# Patient Record
Sex: Female | Born: 1935 | Race: White | Hispanic: No | Smoking: Former smoker
Health system: Southern US, Community
[De-identification: ages and names within clinical notes are randomized; demographics above are authoritative.]

## PROBLEM LIST (undated history)

## (undated) DIAGNOSIS — B019 Varicella without complication: Secondary | ICD-10-CM

## (undated) DIAGNOSIS — K5792 Diverticulitis of intestine, part unspecified, without perforation or abscess without bleeding: Secondary | ICD-10-CM

## (undated) DIAGNOSIS — C159 Malignant neoplasm of esophagus, unspecified: Secondary | ICD-10-CM

## (undated) DIAGNOSIS — K8050 Calculus of bile duct without cholangitis or cholecystitis without obstruction: Secondary | ICD-10-CM

## (undated) DIAGNOSIS — M1990 Unspecified osteoarthritis, unspecified site: Secondary | ICD-10-CM

## (undated) DIAGNOSIS — E119 Type 2 diabetes mellitus without complications: Secondary | ICD-10-CM

## (undated) DIAGNOSIS — K219 Gastro-esophageal reflux disease without esophagitis: Secondary | ICD-10-CM

## (undated) DIAGNOSIS — R32 Unspecified urinary incontinence: Secondary | ICD-10-CM

## (undated) DIAGNOSIS — C55 Malignant neoplasm of uterus, part unspecified: Secondary | ICD-10-CM

## (undated) DIAGNOSIS — G4733 Obstructive sleep apnea (adult) (pediatric): Secondary | ICD-10-CM

## (undated) DIAGNOSIS — I10 Essential (primary) hypertension: Secondary | ICD-10-CM

## (undated) DIAGNOSIS — E785 Hyperlipidemia, unspecified: Secondary | ICD-10-CM

## (undated) HISTORY — PX: SPINAL CORD STIMULATOR IMPLANT: SHX2422

## (undated) HISTORY — DX: Varicella without complication: B01.9

## (undated) HISTORY — PX: TOTAL KNEE ARTHROPLASTY: SHX125

## (undated) HISTORY — DX: Obstructive sleep apnea (adult) (pediatric): G47.33

## (undated) HISTORY — DX: Diverticulitis of intestine, part unspecified, without perforation or abscess without bleeding: K57.92

## (undated) HISTORY — DX: Essential (primary) hypertension: I10

## (undated) HISTORY — DX: Type 2 diabetes mellitus without complications: E11.9

## (undated) HISTORY — DX: Gastro-esophageal reflux disease without esophagitis: K21.9

## (undated) HISTORY — PX: APPENDECTOMY: SHX54

## (undated) HISTORY — DX: Malignant neoplasm of uterus, part unspecified: C55

## (undated) HISTORY — DX: Hyperlipidemia, unspecified: E78.5

## (undated) HISTORY — DX: Unspecified osteoarthritis, unspecified site: M19.90

## (undated) HISTORY — PX: LUMBAR LAMINECTOMY: SHX95

## (undated) HISTORY — PX: CHOLECYSTECTOMY: SHX55

## (undated) HISTORY — PX: TONSILLECTOMY: SUR1361

## (undated) HISTORY — DX: Unspecified urinary incontinence: R32

---

## 1970-12-16 HISTORY — PX: ABDOMINAL HYSTERECTOMY: SHX81

## 2020-10-19 ENCOUNTER — Other Ambulatory Visit: Payer: Self-pay

## 2020-10-19 ENCOUNTER — Encounter: Payer: Self-pay | Admitting: Family Medicine

## 2020-10-19 ENCOUNTER — Ambulatory Visit (INDEPENDENT_AMBULATORY_CARE_PROVIDER_SITE_OTHER): Payer: Medicare Other | Admitting: Family Medicine

## 2020-10-19 ENCOUNTER — Other Ambulatory Visit (HOSPITAL_COMMUNITY)
Admission: RE | Admit: 2020-10-19 | Discharge: 2020-10-19 | Disposition: A | Discharge status: Home / Self Care | Payer: Medicare Other | Source: Ambulatory Visit | Attending: Family Medicine | Admitting: Family Medicine

## 2020-10-19 VITALS — BP 130/60 | HR 59 | Temp 98.5°F | Resp 18 | Ht 64.0 in | Wt 163.4 lb

## 2020-10-19 DIAGNOSIS — K8689 Other specified diseases of pancreas: Secondary | ICD-10-CM | POA: Insufficient documentation

## 2020-10-19 DIAGNOSIS — I10 Essential (primary) hypertension: Secondary | ICD-10-CM | POA: Diagnosis not present

## 2020-10-19 DIAGNOSIS — G8929 Other chronic pain: Secondary | ICD-10-CM

## 2020-10-19 DIAGNOSIS — N898 Other specified noninflammatory disorders of vagina: Secondary | ICD-10-CM | POA: Diagnosis not present

## 2020-10-19 DIAGNOSIS — E119 Type 2 diabetes mellitus without complications: Secondary | ICD-10-CM | POA: Diagnosis not present

## 2020-10-19 DIAGNOSIS — K2100 Gastro-esophageal reflux disease with esophagitis, without bleeding: Secondary | ICD-10-CM

## 2020-10-19 DIAGNOSIS — E1165 Type 2 diabetes mellitus with hyperglycemia: Secondary | ICD-10-CM | POA: Insufficient documentation

## 2020-10-19 DIAGNOSIS — M549 Dorsalgia, unspecified: Secondary | ICD-10-CM

## 2020-10-19 DIAGNOSIS — G4733 Obstructive sleep apnea (adult) (pediatric): Secondary | ICD-10-CM

## 2020-10-19 DIAGNOSIS — E785 Hyperlipidemia, unspecified: Secondary | ICD-10-CM | POA: Insufficient documentation

## 2020-10-19 LAB — POC URINALSYSI DIPSTICK (AUTOMATED)
Bilirubin, UA: NEGATIVE
Blood, UA: NEGATIVE
Glucose, UA: NEGATIVE
Ketones, UA: NEGATIVE
Leukocytes, UA: NEGATIVE
Nitrite, UA: NEGATIVE
Protein, UA: NEGATIVE
Spec Grav, UA: 1.015 (ref 1.010–1.025)
Urobilinogen, UA: 0.2 E.U./dL
pH, UA: 5.5 (ref 5.0–8.0)

## 2020-10-19 MED ORDER — OMEPRAZOLE 20 MG PO CPDR: 20.0000 mg | DELAYED_RELEASE_CAPSULE | Freq: Two times a day (BID) | ORAL | 3 refills | Status: DC

## 2020-10-19 NOTE — Assessment & Plan Note (Signed)
Pt has app with Dr Nelva Bush for pain management

## 2020-10-19 NOTE — Assessment & Plan Note (Signed)
Well controlled, no changes to meds. Encouraged heart healthy diet such as the DASH diet and exercise as tolerated.  con't losartan

## 2020-10-19 NOTE — Assessment & Plan Note (Signed)
F/u gi- They were talking about surgery in [place] but pt does not wish ot have surgery --  Mass is benign

## 2020-10-19 NOTE — Assessment & Plan Note (Signed)
Check labs today Sugars good per pt and daughter

## 2020-10-19 NOTE — Patient Instructions (Signed)
Vaginitis Vaginitis is a condition in which the vaginal tissue swells and becomes red (inflamed). This condition is most often caused by a change in the normal balance of bacteria and yeast that live in the vagina. This change causes an overgrowth of certain bacteria or yeast, which causes the inflammation. There are different types of vaginitis, but the most common types are:  Bacterial vaginosis.  Yeast infection (candidiasis).  Trichomoniasis vaginitis. This is a sexually transmitted disease (STD).  Viral vaginitis.  Atrophic vaginitis.  Allergic vaginitis. What are the causes? The cause of this condition depends on the type of vaginitis. It can be caused by:  Bacteria (bacterial vaginosis).  Yeast, which is a fungus (yeast infection).  A parasite (trichomoniasis vaginitis).  A virus (viral vaginitis).  Low hormone levels (atrophic vaginitis). Low hormone levels can occur during pregnancy, breastfeeding, or after menopause.  Irritants, such as bubble baths, scented tampons, and feminine sprays (allergic vaginitis). Other factors can change the normal balance of the yeast and bacteria that live in the vagina. These include:  Antibiotic medicines.  Poor hygiene.  Diaphragms, vaginal sponges, spermicides, birth control pills, and intrauterine devices (IUD).  Sex.  Infection.  Uncontrolled diabetes.  A weakened defense (immune) system. What increases the risk? This condition is more likely to develop in women who:  Smoke.  Use vaginal douches, scented tampons, or scented sanitary pads.  Wear tight-fitting pants.  Wear thong underwear.  Use oral birth control pills or an IUD.  Have sex without a condom.  Have multiple sex partners.  Have an STD.  Frequently use the spermicide nonoxynol-9.  Eat lots of foods high in sugar.  Have uncontrolled diabetes.  Have low estrogen levels.  Have a weakened immune system from an immune disorder or medical  treatment.  Are pregnant or breastfeeding. What are the signs or symptoms? Symptoms vary depending on the cause of the vaginitis. Common symptoms include:  Abnormal vaginal discharge. ? The discharge is white, gray, or yellow with bacterial vaginosis. ? The discharge is thick, white, and cheesy with a yeast infection. ? The discharge is frothy and yellow or greenish with trichomoniasis.  A bad vaginal smell. The smell is fishy with bacterial vaginosis.  Vaginal itching, pain, or swelling.  Sex that is painful.  Pain or burning when urinating. Sometimes there are no symptoms. How is this diagnosed? This condition is diagnosed based on your symptoms and medical history. A physical exam, including a pelvic exam, will also be done. You may also have other tests, including:  Tests to determine the pH level (acidity or alkalinity) of your vagina.  A whiff test, to assess the odor that results when a sample of your vaginal discharge is mixed with a potassium hydroxide solution.  Tests of vaginal fluid. A sample will be examined under a microscope. How is this treated? Treatment varies depending on the type of vaginitis you have. Your treatment may include:  Antibiotic creams or pills to treat bacterial vaginosis and trichomoniasis.  Antifungal medicines, such as vaginal creams or suppositories, to treat a yeast infection.  Medicine to ease discomfort if you have viral vaginitis. Your sexual partner should also be treated.  Estrogen delivered in a cream, pill, suppository, or vaginal ring to treat atrophic vaginitis. If vaginal dryness occurs, lubricants and moisturizing creams may help. You may need to avoid scented soaps, sprays, or douches.  Stopping use of a product that is causing allergic vaginitis. Then using a vaginal cream to treat the symptoms. Follow   these instructions at home: Lifestyle  Keep your genital area clean and dry. Avoid soap, and only rinse the area with  water.  Do not douche or use tampons until your health care provider says it is okay to do so. Use sanitary pads, if needed.  Do not have sex until your health care provider approves. When you can return to sex, practice safe sex and use condoms.  Wipe from front to back. This avoids the spread of bacteria from the rectum to the vagina. General instructions  Take over-the-counter and prescription medicines only as told by your health care provider.  If you were prescribed an antibiotic medicine, take or use it as told by your health care provider. Do not stop taking or using the antibiotic even if you start to feel better.  Keep all follow-up visits as told by your health care provider. This is important. How is this prevented?  Use mild, non-scented products. Do not use things that can irritate the vagina, such as fabric softeners. Avoid the following products if they are scented: ? Feminine sprays. ? Detergents. ? Tampons. ? Feminine hygiene products. ? Soaps or bubble baths.  Let air reach your genital area. ? Wear cotton underwear to reduce moisture buildup. ? Avoid wearing underwear while you sleep. ? Avoid wearing tight pants and underwear or nylons without a cotton panel. ? Avoid wearing thong underwear.  Take off any wet clothing, such as bathing suits, as soon as possible.  Practice safe sex and use condoms. Contact a health care provider if:  You have abdominal pain.  You have a fever.  You have symptoms that last for more than 2-3 days. Get help right away if:  You have a fever and your symptoms suddenly get worse. Summary  Vaginitis is a condition in which the vaginal tissue becomes inflamed.This condition is most often caused by a change in the normal balance of bacteria and yeast that live in the vagina.  Treatment varies depending on the type of vaginitis you have.  Do not douche, use tampons , or have sex until your health care provider approves. When  you can return to sex, practice safe sex and use condoms. This information is not intended to replace advice given to you by your health care provider. Make sure you discuss any questions you have with your health care provider. Document Revised: 11/14/2017 Document Reviewed: 01/07/2017 Elsevier Patient Education  2020 [employer]  

## 2020-10-19 NOTE — Assessment & Plan Note (Signed)
Inc omeprazole to bid F/u gi

## 2020-10-19 NOTE — Progress Notes (Signed)
Patient ID: Sherry Holloway, female    DOB: 1928-12-14  Age: 86 y.o. MRN: [id number]    Subjective:  Subjective  HPI Erielle Gawronski presents to be established --- her daughter is with her  She c/o vaginal odor that comes and goes  It is not present at this time.   She has a hx of a pancreatic mass that was being worked up in [place] --- it was benign but needed f/u per pt daughter   The pt has no abd pain at this time.    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  She has had mult surgeries   Review of Systems  Constitutional: Negative for appetite change, diaphoresis, fatigue and unexpected weight change.  Eyes: Negative for pain, redness and visual disturbance.  Respiratory: Negative for cough, chest tightness, shortness of breath and wheezing.   Cardiovascular: Negative for chest pain, palpitations and leg swelling.  Endocrine: Negative for cold intolerance, heat intolerance, polydipsia, polyphagia and polyuria.  Genitourinary: Negative for difficulty urinating, dysuria and frequency.  Musculoskeletal: Positive for arthralgias, back pain and gait problem.  Neurological: Negative for dizziness, light-headedness, numbness and headaches.    History Past Medical History:  Diagnosis Date  . Arthritis   . Chicken pox   . Diabetes mellitus without complication ([place])   . Diverticulitis   . Hyperlipidemia   . Hypertension   . OSA (obstructive sleep apnea)   . Urine incontinence     She has a past surgical history that includes Appendectomy; Cholecystectomy; Abdominal hysterectomy (1972); and Spinal cord stimulator implant.   Her family history is not on file.She reports that she has quit smoking. She has never used smokeless tobacco. She reports previous alcohol use. She reports that she does not use drugs.  Current Outpatient Medications on File Prior to Visit  Medication Sig Dispense Refill  .  acetaminophen (TYLENOL) 325 MG tablet Take 650 mg by mouth every 6 (six) hours as needed. 1-2 tablets PRN for pain    . aspirin EC 81 MG tablet Take 81 mg by mouth daily. Swallow whole.    . bumetanide (BUMEX) 1 MG tablet Take 1 mg by mouth daily. Take 1/2 to 1 tablet daily for swelling    . Docusate Sodium (COLACE PO) Take by mouth. PRN for constipation    . famciclovir (FAMVIR) 125 MG tablet Take 125 mg by mouth daily.    Marland Kitchen FLUoxetine (PROZAC) 10 MG capsule Take 10 mg by mouth daily.    Marland Kitchen glimepiride (AMARYL) 2 MG tablet Take 2 mg by mouth daily with breakfast. Take 2 tablets daily    . Ibuprofen (MOTRIN PO) Take by mouth. PRN    . lipase/protease/amylase (CREON) 36000 UNITS CPEP capsule Take 36,000 Units by mouth 3 (three) times daily before meals.    . mirtazapine (REMERON) 30 MG tablet Take 30 mg by mouth at bedtime.    Marland Kitchen morphine (MS CONTIN) 30 MG 12 hr tablet Take 30 mg by mouth every 12 (twelve) hours.    . Multiple Vitamins-Minerals (WOMENS 50+ MULTI VITAMIN/MIN PO) Take by mouth.    Marland Kitchen omeprazole (PRILOSEC) 20 MG capsule Take 20 mg by mouth daily.    . psyllium (METAMUCIL) 58.6 % packet Take 1 packet by mouth daily.    . valsartan (DIOVAN) 40 MG tablet Take 40 mg by mouth daily. Take 1/2 tablet one day, then a  whole tablet the next day     No current facility-administered medications on file prior to visit.     Objective:  Objective  Physical Exam Vitals and nursing note reviewed.  Constitutional:      Appearance: She is well-developed.  HENT:     Head: Normocephalic and atraumatic.  Eyes:     Conjunctiva/sclera: Conjunctivae normal.  Neck:     Thyroid: No thyromegaly.     Vascular: No carotid bruit or JVD.  Cardiovascular:     Rate and Rhythm: Normal rate and regular rhythm.     Heart sounds: Normal heart sounds. No murmur heard.   Pulmonary:     Effort: Pulmonary effort is normal. No respiratory distress.     Breath sounds: Normal breath sounds. No wheezing or rales.    Chest:     Chest wall: No tenderness.  Musculoskeletal:     Cervical back: Normal range of motion and neck supple.     Comments: Walks with walker  Neurological:     Mental Status: She is alert and oriented to person, place, and time.    BP 130/60 (BP Location: Right Arm, Patient Position: Sitting, Cuff Size: Normal)   Pulse (!) 59   Temp 98.5 F (36.9 C) (Oral)   Resp 18   Ht 5\' 4"  (1.626 m)   Wt 163 lb 6.4 oz (74.1 kg)   SpO2 96%   BMI 28.05 kg/m  Wt Readings from Last 3 Encounters:  10/19/20 163 lb 6.4 oz (74.1 kg)     No results found for: WBC, HGB, HCT, PLT, GLUCOSE, CHOL, TRIG, HDL, LDLDIRECT, LDLCALC, ALT, AST, NA, K, CL, CREATININE, BUN, CO2, TSH, PSA, INR, GLUF, HGBA1C, MICROALBUR  Patient was never admitted.   Assessment & Plan:  Plan  I am having Lakyia Alix start on omeprazole. I am also having her maintain her famciclovir, aspirin EC, bumetanide, lipase/protease/amylase, FLUoxetine, glimepiride, psyllium, mirtazapine, morphine, omeprazole, valsartan, Multiple Vitamins-Minerals (WOMENS 50+ MULTI VITAMIN/MIN PO), Ibuprofen (MOTRIN PO), acetaminophen, and Docusate Sodium (COLACE PO).  Meds ordered this encounter  Medications  . omeprazole (PRILOSEC) 20 MG capsule    Sig: Take 1 capsule (20 mg total) by mouth 2 (two) times daily before a meal.    Dispense:  180 capsule    Refill:  3    Problem List Items Addressed This Visit      Unprioritized   Chronic bilateral back pain   Relevant Medications   aspirin EC 81 MG tablet   FLUoxetine (PROZAC) 10 MG capsule   mirtazapine (REMERON) 30 MG tablet   morphine (MS CONTIN) 30 MG 12 hr tablet   Ibuprofen (MOTRIN PO)   acetaminophen (TYLENOL) 325 MG tablet   Diabetes mellitus without complication ([facility])    Check labs today Sugars good per pt and daughter       Relevant Medications   aspirin EC 81 MG tablet   glimepiride (AMARYL) 2 MG tablet   valsartan (DIOVAN) 40 MG tablet   Other Relevant Orders    Referral to Chronic Care Management Services   Hemoglobin A1c   Microalbumin / creatinine urine ratio   Gastroesophageal reflux disease with esophagitis without hemorrhage    Inc omeprazole to bid F/u gi       Relevant Medications   omeprazole (PRILOSEC) 20 MG capsule   Other Relevant Orders   Ambulatory referral to Gastroenterology   Hyperlipidemia    Encouraged heart healthy diet, increase exercise, avoid trans fats, consider a krill oil cap  daily      Relevant Medications   aspirin EC 81 MG tablet   bumetanide (BUMEX) 1 MG tablet   valsartan (DIOVAN) 40 MG tablet   Hypertension    Well controlled, no changes to meds. Encouraged heart healthy diet such as the DASH diet and exercise as tolerated.  con't losartan       Relevant Medications   aspirin EC 81 MG tablet   bumetanide (BUMEX) 1 MG tablet   valsartan (DIOVAN) 40 MG tablet   Other Relevant Orders   Referral to Chronic Care Management Services   OSA (obstructive sleep apnea)   Pancreatic mass - Primary    F/u gi- They were talking about surgery in [place] but pt does not wish ot have surgery --  Mass is benign       Relevant Orders   CBC with Differential/Platelet   Lipid panel   Comprehensive metabolic panel   Amylase   Lipase   Ambulatory referral to Gastroenterology   Vaginal odor    None now Check UA and cervicoancillary       Relevant Orders   POCT Urinalysis Dipstick (Automated) (Completed)   Cervicovaginal ancillary only( )      Follow-up: Return in about 3 months (around 01/19/2021), or if symptoms worsen or fail to improve, for medicare wellness .  Ann Held, DO

## 2020-10-19 NOTE — Assessment & Plan Note (Signed)
Encouraged heart healthy diet, increase exercise, avoid trans fats, consider a krill oil cap daily 

## 2020-10-19 NOTE — Assessment & Plan Note (Signed)
None now Check UA and cervicoancillary

## 2020-10-20 LAB — CBC WITH DIFFERENTIAL/PLATELET
Absolute Monocytes: 528 cells/uL (ref 200–950)
Basophils Absolute: 53 cells/uL (ref 0–200)
Basophils Relative: 0.6 %
Eosinophils Absolute: 220 cells/uL (ref 15–500)
Eosinophils Relative: 2.5 %
HCT: 42 % (ref 35.0–45.0)
Hemoglobin: 13.5 g/dL (ref 11.7–15.5)
Lymphs Abs: 3177 cells/uL (ref 850–3900)
MCH: 27.4 pg (ref 27.0–33.0)
MCHC: 32.1 g/dL (ref 32.0–36.0)
MCV: 85.4 fL (ref 80.0–100.0)
MPV: 12.1 fL (ref 7.5–12.5)
Monocytes Relative: 6 %
Neutro Abs: 4822 cells/uL (ref 1500–7800)
Neutrophils Relative %: 54.8 %
Platelets: 215 10*3/uL (ref 140–400)
RBC: 4.92 10*6/uL (ref 3.80–5.10)
RDW: 13 % (ref 11.0–15.0)
Total Lymphocyte: 36.1 %
WBC: 8.8 10*3/uL (ref 3.8–10.8)

## 2020-10-20 LAB — LIPID PANEL
Cholesterol: 168 mg/dL (ref <200)
HDL: 56 mg/dL (ref >=50)
LDL Cholesterol (Calc): 78 mg/dL (calc)
Non-HDL Cholesterol (Calc): 112 mg/dL (calc) (ref <130)
Total CHOL/HDL Ratio: 3 (calc) (ref <5.0)
Triglycerides: 246 mg/dL — ABNORMAL HIGH (ref <150)

## 2020-10-20 LAB — COMPREHENSIVE METABOLIC PANEL
AG Ratio: 1.7 (calc) (ref 1.0–2.5)
ALT: 19 U/L (ref 6–29)
AST: 11 U/L (ref 10–35)
Albumin: 4.3 g/dL (ref 3.6–5.1)
Alkaline phosphatase (APISO): 77 U/L (ref 37–153)
BUN/Creatinine Ratio: 23 (calc) — ABNORMAL HIGH (ref 6–22)
BUN: 21 mg/dL (ref 7–25)
CO2: 30 mmol/L (ref 20–32)
Calcium: 10 mg/dL (ref 8.6–10.4)
Chloride: 101 mmol/L (ref 98–110)
Creat: 0.9 mg/dL — ABNORMAL HIGH (ref 0.60–0.88)
Globulin: 2.6 g/dL (calc) (ref 1.9–3.7)
Glucose, Bld: 111 mg/dL — ABNORMAL HIGH (ref 65–99)
Potassium: 4.4 mmol/L (ref 3.5–5.3)
Sodium: 139 mmol/L (ref 135–146)
Total Bilirubin: 0.3 mg/dL (ref 0.2–1.2)
Total Protein: 6.9 g/dL (ref 6.1–8.1)

## 2020-10-20 LAB — MICROALBUMIN / CREATININE URINE RATIO
Creatinine, Urine: 42 mg/dL (ref 20–275)
Microalb Creat Ratio: 90 mcg/mg creat — ABNORMAL HIGH (ref <30)
Microalb, Ur: 3.8 mg/dL

## 2020-10-20 LAB — LIPASE: Lipase: 10 U/L (ref 7–60)

## 2020-10-20 LAB — AMYLASE: Amylase: 30 U/L (ref 21–101)

## 2020-10-20 LAB — HEMOGLOBIN A1C
Hgb A1c MFr Bld: 8.3 % of total Hgb — ABNORMAL HIGH (ref <5.7)
Mean Plasma Glucose: 192 (calc)
eAG (mmol/L): 10.6 (calc)

## 2020-10-21 ENCOUNTER — Encounter: Payer: Self-pay | Admitting: Family Medicine

## 2020-10-23 ENCOUNTER — Other Ambulatory Visit: Payer: Self-pay | Admitting: *Deleted

## 2020-10-23 LAB — CERVICOVAGINAL ANCILLARY ONLY
Bacterial Vaginitis (gardnerella): NEGATIVE
Candida Glabrata: NEGATIVE
Candida Vaginitis: NEGATIVE
Comment: NEGATIVE
Comment: NEGATIVE
Comment: NEGATIVE
Comment: NEGATIVE
Trichomonas: NEGATIVE

## 2020-10-23 MED ORDER — MIRTAZAPINE 15 MG PO TABS: 15.0000 mg | ORAL_TABLET | Freq: Every day | ORAL | 0 refills | Status: DC

## 2020-10-23 MED ORDER — GLIMEPIRIDE 4 MG PO TABS: 4.0000 mg | ORAL_TABLET | Freq: Every day | ORAL | 1 refills | Status: DC

## 2020-10-23 NOTE — Telephone Encounter (Signed)
Please advise 

## 2020-10-23 NOTE — Telephone Encounter (Signed)
Cut down to 15 mg  #30 -----let us know how that goes and we can cut down to 7.5 after that

## 2020-10-23 NOTE — Addendum Note (Signed)
Addended by: Sanda Linger on: 10/23/2020 01:29 PM   Modules accepted: Orders

## 2020-11-22 ENCOUNTER — Telehealth: Payer: Self-pay | Admitting: Family Medicine

## 2020-11-22 DIAGNOSIS — G4733 Obstructive sleep apnea (adult) (pediatric): Secondary | ICD-10-CM

## 2020-11-22 NOTE — Telephone Encounter (Signed)
Sherry Holloway states they need a new mask for cpad machine ( [employer])(Model # [id number]. She is not sure how to obtained mask, since machine was prescribe back in [place]. Please advise

## 2020-11-22 NOTE — Telephone Encounter (Signed)
Please advise. Last refill was from a historical provider. Pt has visit scheduled in February. Please advise

## 2020-11-22 NOTE — Telephone Encounter (Signed)
Spoke with daughter. She states the previous company is sending a new mask but since they live here now they need a new place to order for supplies. Please advise

## 2020-11-22 NOTE — Telephone Encounter (Signed)
Patient is requesting a 90days supply    Medication: lipase/protease/amylase (CREON) 36000 UNITS CPEP capsule  Has the patient contacted their pharmacy? No. (If no, request that the patient contact the pharmacy for the refill.) (If yes, when and what did the pharmacy advise?)  Preferred Pharmacy (with phone number or street name):  [facility], [place] Phone:  [phone]  Fax:  [phone]       Agent: Please be advised that RX refills may take up to 3 business days. We ask that you follow-up with your pharmacy.

## 2020-11-23 ENCOUNTER — Other Ambulatory Visit: Payer: Self-pay | Admitting: Family Medicine

## 2020-11-23 NOTE — Telephone Encounter (Signed)
Refill x1 Pt still waiting for GI referral-----  why has pt not been called yet?

## 2020-11-23 NOTE — Telephone Encounter (Signed)
Was she seeing a specialist in [place]?  Pulm maybe?   [facility] or [facility] can help with this

## 2020-11-24 ENCOUNTER — Encounter: Payer: Self-pay | Admitting: Family Medicine

## 2020-11-24 MED ORDER — PANCRELIPASE (LIP-PROT-AMYL) 36000-114000 UNITS PO CPEP
36000.0000 [IU] | ORAL_CAPSULE | Freq: Three times a day (TID) | ORAL | 1 refills | Status: DC
Start: 2020-11-24 — End: 2020-11-27

## 2020-11-24 NOTE — Telephone Encounter (Signed)
Refill sent.

## 2020-11-27 ENCOUNTER — Encounter: Payer: Self-pay | Admitting: Family Medicine

## 2020-11-27 MED ORDER — PANCRELIPASE (LIP-PROT-AMYL) 36000-114000 UNITS PO CPEP
36000.0000 [IU] | ORAL_CAPSULE | Freq: Three times a day (TID) | ORAL | 1 refills | Status: DC
Start: 2020-11-27 — End: 2020-11-28

## 2020-11-27 NOTE — Telephone Encounter (Signed)
Orders faxed to [facility].

## 2020-11-27 NOTE — Addendum Note (Signed)
Addended by: Sanda Linger on: 11/27/2020 04:13 PM   Modules accepted: Orders

## 2020-11-28 ENCOUNTER — Telehealth: Payer: Self-pay | Admitting: *Deleted

## 2020-11-28 ENCOUNTER — Other Ambulatory Visit: Payer: Self-pay | Admitting: Family Medicine

## 2020-11-28 MED ORDER — PANCRELIPASE (LIP-PROT-AMYL) 36000-114000 UNITS PO CPEP: 36000.0000 [IU] | ORAL_CAPSULE | Freq: Three times a day (TID) | ORAL | 1 refills | Status: DC

## 2020-11-28 MED ORDER — BUMETANIDE 1 MG PO TABS: 1.0000 mg | ORAL_TABLET | Freq: Every day | ORAL | 1 refills | Status: DC

## 2020-11-28 NOTE — Telephone Encounter (Signed)
[facility]ed over request for refills for bumetanide 1mg  tab and Creon 36000U.  Are you going to take over refills for this?  She just established care with you.

## 2020-11-28 NOTE — Telephone Encounter (Signed)
Yes-- she can hold the diovan for now -- and con't to follow bp

## 2020-11-28 NOTE — Telephone Encounter (Signed)
Done for now

## 2020-12-01 NOTE — Telephone Encounter (Signed)
Daughter wants to know if you have received a fax from [place] office includes a c-pad setting and sleep study. Daughter states you will need those notes to complete paperwork for [facility]

## 2020-12-04 ENCOUNTER — Other Ambulatory Visit: Payer: Self-pay | Admitting: Family Medicine

## 2020-12-04 DIAGNOSIS — R0989 Other specified symptoms and signs involving the circulatory and respiratory systems: Secondary | ICD-10-CM

## 2020-12-04 NOTE — Telephone Encounter (Signed)
I agree she needs vascular referral

## 2020-12-04 NOTE — Telephone Encounter (Signed)
Documentation received. Placed in folder

## 2020-12-05 ENCOUNTER — Other Ambulatory Visit: Payer: Self-pay

## 2020-12-05 DIAGNOSIS — R0989 Other specified symptoms and signs involving the circulatory and respiratory systems: Secondary | ICD-10-CM

## 2020-12-05 NOTE — Telephone Encounter (Signed)
We received office notes

## 2020-12-05 NOTE — Telephone Encounter (Signed)
I already put the referral in ----  i'm not sure who is doing the referral but we can let who ever is doing referrals for [place] tomorrow know so they can let vascular know

## 2020-12-21 ENCOUNTER — Telehealth: Payer: Self-pay | Admitting: Family Medicine

## 2020-12-21 NOTE — Telephone Encounter (Signed)
Caller Tiffany  From [employer] back number [phone]   Need the following   1-copy of the sleep study 2-office notes before he sleep study was order 3-Most recent doctors notes showing patient is on compliance and benefits from using the cpad 4- If you don't have any of the info a new sleep study has to be done. (please let them know)

## 2020-12-22 ENCOUNTER — Other Ambulatory Visit: Payer: Self-pay

## 2020-12-22 ENCOUNTER — Ambulatory Visit (INDEPENDENT_AMBULATORY_CARE_PROVIDER_SITE_OTHER): Payer: Medicare Other | Admitting: Family Medicine

## 2020-12-22 VITALS — BP 162/52 | HR 66 | Temp 98.2°F | Ht 64.0 in | Wt 164.0 lb

## 2020-12-22 DIAGNOSIS — M6530 Trigger finger, unspecified finger: Secondary | ICD-10-CM

## 2020-12-22 DIAGNOSIS — B070 Plantar wart: Secondary | ICD-10-CM | POA: Diagnosis not present

## 2020-12-22 MED ORDER — PREDNISONE 20 MG PO TABS: 40.0000 mg | ORAL_TABLET | Freq: Every day | ORAL | 0 refills | Status: DC

## 2020-12-22 NOTE — Progress Notes (Signed)
Patient ID: Sherry Holloway, female    DOB: Apr 29, 1928  Age: 87 y.o. MRN: [id number]    Subjective:  Subjective  HPI Sherry Holloway presents for ? Wart on R heel-- no pain.    Spot has been there 3 months  bs have been 140-170      Review of Systems  Constitutional: Negative for appetite change, diaphoresis, fatigue and unexpected weight change.  Eyes: Negative for pain, redness and visual disturbance.  Respiratory: Negative for cough, chest tightness, shortness of breath and wheezing.   Cardiovascular: Negative for chest pain, palpitations and leg swelling.  Endocrine: Negative for cold intolerance, heat intolerance, polydipsia, polyphagia and polyuria.  Genitourinary: Negative for difficulty urinating, dysuria and frequency.  Skin:       + ? Wart on heel  Neurological: Negative for dizziness, light-headedness, numbness and headaches.    History Past Medical History:  Diagnosis Date   Arthritis    Chicken pox    Diabetes mellitus without complication ([place])    Diverticulitis    Hyperlipidemia    Hypertension    OSA (obstructive sleep apnea)    Urine incontinence     She has a past surgical history that includes Appendectomy; Cholecystectomy; Abdominal hysterectomy (1972); and Spinal cord stimulator implant.   Her family history is not on file.She reports that she has quit smoking. She has never used smokeless tobacco. She reports previous alcohol use. She reports that she does not use drugs.  Current Outpatient Medications on File Prior to Visit  Medication Sig Dispense Refill   acetaminophen (TYLENOL) 325 MG tablet Take 650 mg by mouth every 6 (six) hours as needed. 1-2 tablets PRN for pain     aspirin EC 81 MG tablet Take 81 mg by mouth daily. Swallow whole.     bumetanide (BUMEX) 1 MG tablet Take 1 tablet (1 mg total) by mouth daily. Take 1/2 to 1 tablet daily for swelling 90 tablet 1   Docusate Sodium (COLACE PO) Take by mouth. PRN for constipation      famciclovir (FAMVIR) 125 MG tablet Take 125 mg by mouth daily.     FLUoxetine (PROZAC) 10 MG capsule Take 10 mg by mouth daily.     glimepiride (AMARYL) 4 MG tablet Take 1 tablet (4 mg total) by mouth daily before breakfast. 90 tablet 1   Ibuprofen (MOTRIN PO) Take by mouth. PRN     lipase/protease/amylase (CREON) 36000 UNITS CPEP capsule Take 1 capsule (36,000 Units total) by mouth 3 (three) times daily before meals. 270 capsule 1   mirtazapine (REMERON) 15 MG tablet Take 1 tablet (15 mg total) by mouth at bedtime. 30 tablet 0   morphine (MS CONTIN) 30 MG 12 hr tablet Take 30 mg by mouth every 12 (twelve) hours.     Multiple Vitamins-Minerals (WOMENS 50+ MULTI VITAMIN/MIN PO) Take by mouth.     omeprazole (PRILOSEC) 20 MG capsule Take 1 capsule (20 mg total) by mouth 2 (two) times daily before a meal. 180 capsule 3   psyllium (METAMUCIL) 58.6 % packet Take 1 packet by mouth daily.     valsartan (DIOVAN) 40 MG tablet Take 40 mg by mouth daily. Take 1/2 tablet one day, then a whole tablet the next day     omeprazole (PRILOSEC) 20 MG capsule Take 20 mg by mouth daily.     No current facility-administered medications on file prior to visit.     Objective:  Objective  Physical Exam Vitals and nursing note reviewed.  Constitutional:  Appearance: She is well-developed and well-nourished.  HENT:     Head: Normocephalic and atraumatic.  Eyes:     Extraocular Movements: EOM normal.     Conjunctiva/sclera: Conjunctivae normal.  Neck:     Thyroid: No thyromegaly.     Vascular: No carotid bruit or JVD.  Cardiovascular:     Rate and Rhythm: Normal rate and regular rhythm.     Heart sounds: Normal heart sounds. No murmur heard.   Pulmonary:     Effort: Pulmonary effort is normal. No respiratory distress.     Breath sounds: Normal breath sounds. No wheezing or rales.  Chest:     Chest wall: No tenderness.  Musculoskeletal:        General: Swelling and tenderness present. No  edema.     Cervical back: Normal range of motion and neck supple.  Skin:    Findings: Lesion present.       Neurological:     Mental Status: She is alert and oriented to person, place, and time.  Psychiatric:        Mood and Affect: Mood and affect normal.    BP (!) 162/52 (BP Location: Right Arm, Patient Position: Sitting, Cuff Size: Large)    Pulse 66    Temp 98.2 F (36.8 C) (Oral)    Ht 5\' 4"  (1.626 m)    Wt 164 lb (74.4 kg)    SpO2 96%    BMI 28.15 kg/m  Wt Readings from Last 3 Encounters:  12/22/20 164 lb (74.4 kg)  10/19/20 163 lb 6.4 oz (74.1 kg)     Lab Results  Component Value Date   WBC 8.8 10/19/2020   HGB 13.5 10/19/2020   HCT 42.0 10/19/2020   PLT 215 10/19/2020   GLUCOSE 111 (H) 10/19/2020   CHOL 168 10/19/2020   TRIG 246 (H) 10/19/2020   HDL 56 10/19/2020   LDLCALC 78 10/19/2020   ALT 19 10/19/2020   AST 11 10/19/2020   NA 139 10/19/2020   K 4.4 10/19/2020   CL 101 10/19/2020   CREATININE 0.90 (H) 10/19/2020   BUN 21 10/19/2020   CO2 30 10/19/2020   HGBA1C 8.3 (H) 10/19/2020   MICROALBUR 3.8 10/19/2020    No results found.   Assessment & Plan:  Plan  I am having Sherry Holloway start on predniSONE. I am also having her maintain her famciclovir, aspirin EC, FLUoxetine, psyllium, morphine, omeprazole, valsartan, Multiple Vitamins-Minerals (WOMENS 50+ MULTI VITAMIN/MIN PO), Ibuprofen (MOTRIN PO), acetaminophen, Docusate Sodium (COLACE PO), omeprazole, glimepiride, mirtazapine, lipase/protease/amylase, and bumetanide.  Meds ordered this encounter  Medications   predniSONE (DELTASONE) 20 MG tablet    Sig: Take 2 tablets (40 mg total) by mouth daily.    Dispense:  10 tablet    Refill:  0    Problem List Items Addressed This Visit   None   Visit Diagnoses    Plantar wart    -  Primary   Relevant Orders   Ambulatory referral to Podiatry   Trigger finger of left hand, unspecified finger       Relevant Medications   predniSONE (DELTASONE) 20 MG  tablet      Follow-up: No follow-ups on file.  Ann Held, DO

## 2020-12-22 NOTE — Patient Instructions (Signed)
Plantar Warts Warts are small growths on the skin. When they occur on the underside (sole) of the foot, they are called plantar warts. Plantar warts often occur in groups, with several small warts around a larger wart. They tend to develop on the heel or the ball of the foot. They may grow into the deeper layers of skin or rise above the surface of the skin. Most warts are not painful, and they usually do not cause problems. However, plantar warts may cause pain when you walk because pressure is applied to them. Plantar warts may spread to other areas of the sole. They can also spread to other areas of the body through direct and indirect contact. Warts often go away on their own in time. Various treatments may be done if needed or desired. What are the causes? Plantar warts are caused by a type of virus that is called human papillomavirus (HPV).  Walking barefoot can cause exposure to the virus, especially if your feet are wet.  HPV attacks a break in the skin of the foot. What increases the risk? You are more likely to develop this condition if you:  Are between 79-44 years of age.  Use public showers or locker rooms.  Have a weakened body defense system (immune system). What are the signs or symptoms? Common symptoms of this condition include:  Flat or slightly raised growths that have a rough surface and look similar to a callus.  Pain when you use your foot to support your body weight. How is this diagnosed? A plantar wart can usually be diagnosed from its appearance. In some cases, a tissue sample may be removed (biopsy) to be looked at under a microscope. How is this treated? In many cases, warts do not need treatment. Without treatment, they often go away with time. If treatment is needed or desired, options may include:  Applying medicated solutions, creams, or patches to the wart. These may be over-the-counter or prescription medicines that make the skin soft so that layers will  gradually shed away. In many cases, the medicine is applied one or two times a day and covered with a bandage.  Freezing the wart with liquid nitrogen (cryotherapy).  Burning the wart with: ? Laser treatment. ? An electrified probe (electrocautery).  Injecting a medicine (Candida antigen) into the wart to help the body's immune system fight off the wart.  Having surgery to remove the wart.  Putting duct tape over the top of the wart (occlusion). You will leave the tape in place for as long as told by your health care provider, and then you will replace it with a new strip of tape. This is done until the wart goes away. Repeat treatment may be needed if you choose to remove warts. Warts sometimes go away and come back again. Follow these instructions at home:  Apply medicated creams or solutions only as told by your health care provider. This may involve: ? Soaking the affected area in warm water. ? Removing the top layer of softened skin before you apply the medicine. A pumice stone works well for removing the skin. ? Applying a bandage over the affected area after you apply the medicine. ? Repeating the process daily or as told by your health care provider.  Do not scratch or pick at a wart.  Wash your hands after you touch a wart.  If a wart is painful, try covering it with a bandage that has a hole in the middle. This helps  to take pressure off the wart.  Keep all follow-up visits as told by your health care provider. This is important. How is this prevented? Take these actions to help prevent warts:  Wear shoes and socks. Change your socks daily.  Keep your feet clean and dry.  Do not walk barefoot in shared locker rooms, shower areas, or swimming pools.  Check your feet regularly.  Avoid direct contact with warts on other people. Contact a health care provider if:  Your warts do not improve after treatment.  You have redness, swelling, or pain at the site of a  wart.  You have bleeding from a wart that does not stop with light pressure.  You have diabetes and you develop a wart. Summary  Warts are small growths on the skin. When they occur on the underside (sole) of the foot, they are called plantar warts.  In many cases, warts do not need treatment. Without treatment, they often go away with time.  Apply medicated creams or solutions only as told by your health care provider.  Do not scratch or pick at a wart. Wash your hands after you touch a wart.  Keep all follow-up visits as told by your health care provider. This is important. This information is not intended to replace advice given to you by your health care provider. Make sure you discuss any questions you have with your health care provider. Document Revised: 06/30/2018 Document Reviewed: 06/30/2018 Elsevier Patient Education  2020 Elsevier Inc.  

## 2020-12-24 ENCOUNTER — Encounter: Payer: Self-pay | Admitting: Family Medicine

## 2020-12-24 DIAGNOSIS — M6530 Trigger finger, unspecified finger: Secondary | ICD-10-CM | POA: Insufficient documentation

## 2020-12-24 DIAGNOSIS — B070 Plantar wart: Secondary | ICD-10-CM | POA: Insufficient documentation

## 2020-12-24 NOTE — Assessment & Plan Note (Signed)
Recommend ortho referral but pt daughter is really struggling to get her to drs because or her job and getting time off She will call if symptoms worsen

## 2020-12-24 NOTE — Assessment & Plan Note (Signed)
Refer to podiatry

## 2020-12-25 ENCOUNTER — Telehealth: Payer: Self-pay

## 2020-12-25 MED ORDER — FAMCICLOVIR 125 MG PO TABS: 125.0000 mg | ORAL_TABLET | Freq: Every day | ORAL | 3 refills | Status: DC

## 2020-12-25 NOTE — Telephone Encounter (Signed)
We got a refill request for Famciclovir tabs 125mg  [facility] for 90 days is it ok to refill?

## 2020-12-25 NOTE — Telephone Encounter (Signed)
Ok to fill 90 days with 3 refills

## 2020-12-28 ENCOUNTER — Telehealth: Payer: Self-pay | Admitting: Family Medicine

## 2020-12-28 NOTE — Telephone Encounter (Signed)
Patient daughter's states Sherry Holloway from [facility] need last sleep study notes along with cpad settings.  Sherry Holloway) [phone]

## 2020-12-28 NOTE — Telephone Encounter (Signed)
Spoke Sherry Holloway- informed that only note that I have in chart is from 2009 (not an actual sleep study)- informed we are waiting on medical records from [place]. She will postpone in her system the paperwork that is due for now.

## 2020-12-28 NOTE — Telephone Encounter (Signed)
See other telephone note.  

## 2021-01-03 ENCOUNTER — Encounter: Payer: Self-pay | Admitting: Physician Assistant

## 2021-01-09 ENCOUNTER — Ambulatory Visit (INDEPENDENT_AMBULATORY_CARE_PROVIDER_SITE_OTHER): Payer: Medicare Other | Admitting: Podiatry

## 2021-01-09 ENCOUNTER — Encounter: Payer: Self-pay | Admitting: Podiatry

## 2021-01-09 ENCOUNTER — Other Ambulatory Visit: Payer: Self-pay

## 2021-01-09 DIAGNOSIS — D2371 Other benign neoplasm of skin of right lower limb, including hip: Secondary | ICD-10-CM | POA: Diagnosis not present

## 2021-01-09 DIAGNOSIS — M65342 Trigger finger, left ring finger: Secondary | ICD-10-CM | POA: Insufficient documentation

## 2021-01-09 DIAGNOSIS — M65332 Trigger finger, left middle finger: Secondary | ICD-10-CM | POA: Insufficient documentation

## 2021-01-09 MED ORDER — FLUOROURACIL 5 % EX CREA: TOPICAL_CREAM | Freq: Two times a day (BID) | CUTANEOUS | 0 refills | Status: DC

## 2021-01-09 NOTE — Progress Notes (Signed)
Subjective:  Patient ID: Sherry Holloway, female    DOB: Apr 12, 1928,  MRN: [id number] HPI Chief Complaint  Patient presents with  . Foot Pain    Posterior heel right - darkened callused area x few months, just moved here from [place] with her daughter, now has become a little sore, daughter concerned  . New Patient (Initial Visit)    87 y.o. female presents with the above complaint.   ROS: Denies fever chills nausea vomiting muscle aches pains calf pain back pain chest pain shortness of breath.  Past Medical History:  Diagnosis Date  . Arthritis   . Chicken pox   . Diabetes mellitus without complication ([place])   . Diverticulitis   . Hyperlipidemia   . Hypertension   . OSA (obstructive sleep apnea)   . Urine incontinence    Past Surgical History:  Procedure Laterality Date  . ABDOMINAL HYSTERECTOMY  1972  . APPENDECTOMY    . CHOLECYSTECTOMY    . SPINAL CORD STIMULATOR IMPLANT      Current Outpatient Medications:  .  fluorouracil (EFUDEX) 5 % cream, Apply topically 2 (two) times daily., Disp: 40 g, Rfl: 0 .  acetaminophen (TYLENOL) 325 MG tablet, Take 650 mg by mouth every 6 (six) hours as needed. 1-2 tablets PRN for pain, Disp: , Rfl:  .  aspirin EC 81 MG tablet, Take 81 mg by mouth daily. Swallow whole., Disp: , Rfl:  .  bumetanide (BUMEX) 1 MG tablet, Take 1 tablet (1 mg total) by mouth daily. Take 1/2 to 1 tablet daily for swelling, Disp: 90 tablet, Rfl: 1 .  Docusate Sodium (COLACE PO), Take by mouth. PRN for constipation, Disp: , Rfl:  .  famciclovir (FAMVIR) 125 MG tablet, Take 1 tablet (125 mg total) by mouth daily., Disp: 90 tablet, Rfl: 3 .  FLUoxetine (PROZAC) 10 MG capsule, Take 10 mg by mouth daily., Disp: , Rfl:  .  glimepiride (AMARYL) 4 MG tablet, Take 1 tablet (4 mg total) by mouth daily before breakfast., Disp: 90 tablet, Rfl: 1 .  Ibuprofen (MOTRIN PO), Take by mouth. PRN, Disp: , Rfl:  .  latanoprost (XALATAN) 0.005 % ophthalmic solution, 1 drop at  bedtime., Disp: , Rfl:  .  lipase/protease/amylase (CREON) 36000 UNITS CPEP capsule, Take 1 capsule (36,000 Units total) by mouth 3 (three) times daily before meals., Disp: 270 capsule, Rfl: 1 .  mirtazapine (REMERON) 15 MG tablet, Take 1 tablet (15 mg total) by mouth at bedtime., Disp: 30 tablet, Rfl: 0 .  morphine (MS CONTIN) 30 MG 12 hr tablet, Take 30 mg by mouth every 12 (twelve) hours., Disp: , Rfl:  .  Multiple Vitamins-Minerals (WOMENS 50+ MULTI VITAMIN/MIN PO), Take by mouth., Disp: , Rfl:  .  omeprazole (PRILOSEC) 20 MG capsule, Take 1 capsule (20 mg total) by mouth 2 (two) times daily before a meal., Disp: 180 capsule, Rfl: 3 .  predniSONE (DELTASONE) 20 MG tablet, Take 2 tablets (40 mg total) by mouth daily., Disp: 10 tablet, Rfl: 0 .  psyllium (METAMUCIL) 58.6 % packet, Take 1 packet by mouth daily., Disp: , Rfl:  .  valsartan (DIOVAN) 40 MG tablet, Take 40 mg by mouth daily. Take 1/2 tablet one day, then a whole tablet the next day, Disp: , Rfl:   Allergies  Allergen Reactions  . Sulfa Antibiotics   . Wound Dressing Adhesive    Review of Systems Objective:  There were no vitals filed for this visit.  General: Well developed, nourished, in no  acute distress, alert and oriented x3   Dermatological: Skin is warm, dry and supple bilateral. Nails x 10 are well maintained; remaining integument appears unremarkable at this time. There are no open sores, no preulcerative lesions, no rash or signs of infection present.  Reactive hyperkeratotic lesion measuring less than a centimeter in diameter to the posterior inferior aspect of her right heel.  This is a hard area that is sitting on the bed of bruise that once debrided does demonstrate pinpoint bleeding skin lines circumvent the lesion in.  Be verrucoid in nature though measures about 4 mm in total diameter.  It is mildly tender on palpation but the fact that is in the area that it is in and on a 87 year old individual makes me question  whether this very well could be dysplastic.  I will follow-up with her in a few weeks for reevaluation of this.  Vascular: Dorsalis Pedis artery and Posterior Tibial artery pedal pulses are 2/4 bilateral with immedate capillary fill time. Pedal hair growth present. No varicosities and no lower extremity edema present bilateral.   Neruologic: Grossly intact via light touch bilateral. Vibratory intact via tuning fork bilateral. Protective threshold with Semmes Wienstein monofilament intact to all pedal sites bilateral. Patellar and Achilles deep tendon reflexes 2+ bilateral. No Babinski or clonus noted bilateral.   Musculoskeletal: No gross boney pedal deformities bilateral. No pain, crepitus, or limitation noted with foot and ankle range of motion bilateral. Muscular strength 5/5 in all groups tested bilateral.  Gait: Unassisted, Nonantalgic.    Radiographs:  None taken  Assessment & Plan:   Assessment: Probable wart cannot rule out dysplastic lesion posterior heel right.  Plan: Discussed etiology pathology conservative surgical therapies debrided the area today to pinpoint bleeding skin lines circumvent the lesion appears to be verrucoid in nature but at her age and in the position that this is in it so it makes me wonder if it may not be some type of basal cell or something like that.  At this point I am going to place salicylic acid under occlusion to be left on for 3 days and then washed off thoroughly at that point she will start Efudex cream and see how this does.  If this fails to alleviate her symptoms within 6 weeks then we will consider surgical excision of this lesion.     Darlene Bartelt T. [place], [place]

## 2021-01-12 NOTE — Telephone Encounter (Signed)
Caller: Tiffany ([employer]) Call back [phone]  Calling back to check the status of information needed in order to process. If you unable to locate information she will have to closed the referral.  Pl

## 2021-01-16 NOTE — Telephone Encounter (Signed)
Called back Left VM

## 2021-01-17 ENCOUNTER — Encounter: Payer: Self-pay | Admitting: Physician Assistant

## 2021-01-17 ENCOUNTER — Other Ambulatory Visit (INDEPENDENT_AMBULATORY_CARE_PROVIDER_SITE_OTHER): Payer: Medicare Other

## 2021-01-17 ENCOUNTER — Ambulatory Visit (INDEPENDENT_AMBULATORY_CARE_PROVIDER_SITE_OTHER): Payer: Medicare Other | Admitting: Physician Assistant

## 2021-01-17 VITALS — BP 102/50 | HR 64 | Ht 64.0 in | Wt 161.2 lb

## 2021-01-17 DIAGNOSIS — K219 Gastro-esophageal reflux disease without esophagitis: Secondary | ICD-10-CM

## 2021-01-17 DIAGNOSIS — K8689 Other specified diseases of pancreas: Secondary | ICD-10-CM

## 2021-01-17 LAB — COMPREHENSIVE METABOLIC PANEL
ALT: 12 U/L (ref 0–35)
AST: 9 U/L (ref 0–37)
Albumin: 3.4 g/dL — ABNORMAL LOW (ref 3.5–5.2)
Alkaline Phosphatase: 64 U/L (ref 39–117)
BUN: 20 mg/dL (ref 6–23)
CO2: 30 mEq/L (ref 19–32)
Calcium: 9.5 mg/dL (ref 8.4–10.5)
Chloride: 98 mEq/L (ref 96–112)
Creatinine, Ser: 0.88 mg/dL (ref 0.40–1.20)
GFR: 57.64 mL/min — ABNORMAL LOW (ref >60.00)
Glucose, Bld: 264 mg/dL — ABNORMAL HIGH (ref 70–99)
Potassium: 4.3 mEq/L (ref 3.5–5.1)
Sodium: 133 mEq/L — ABNORMAL LOW (ref 135–145)
Total Bilirubin: 0.4 mg/dL (ref 0.2–1.2)
Total Protein: 6.6 g/dL (ref 6.0–8.3)

## 2021-01-17 NOTE — Patient Instructions (Addendum)
If you are age 85 or older, your body mass index should be between 23-30. Your Body mass index is 27.68 kg/m. If this is out of the aforementioned range listed, please consider follow up with your Primary Care Provider.  If you are age 41 or younger, your body mass index should be between 19-25. Your Body mass index is 27.68 kg/m. If this is out of the aformentioned range listed, please consider follow up with your Primary Care Provider.   Your provider has requested that you go to the basement level for lab work before leaving today. Press "B" on the elevator. The lab is located at the first door on the left as you exit the elevator.  You have been scheduled for a CT scan of the abdomen and pelvis at [facility], 1st floor Radiology. You are scheduled on 01/25/21  at [facility] should arrive 15 minutes prior to your appointment time for registration.  Please pick up 2 bottles of contrast from [place] at least 3 days prior to your scan. The solution may taste better if refrigerated, but do NOT add ice or any other liquid to this solution. Shake well before drinking.   Please follow the written instructions below on the day of your exam:   1) Do not eat anything after 1pm (4 hours prior to your test)   2) Drink 1 bottle of contrast @ 2pm (2 hours prior to your exam)  Remember to shake well before drinking and do NOT pour over ice.     Drink 1 bottle of contrast @ 3pm (1 hour prior to your exam)   You may take any medications as prescribed with a small amount of water, if necessary. If you take any of the following medications: METFORMIN, GLUCOPHAGE, GLUCOVANCE, AVANDAMET, RIOMET, FORTAMET, [place] MET, JANUMET, GLUMETZA or METAGLIP, you MAY be asked to HOLD this medication 48 hours AFTER the exam.   The purpose of you drinking the oral contrast is to aid in the visualization of your intestinal tract. The contrast solution may cause some diarrhea. Depending on your individual set of  symptoms, you may also receive an intravenous injection of x-ray contrast/dye. Plan on being at [facility] for 45 minutes or longer, depending on the type of exam you are having performed.   If you have any questions regarding your exam or if you need to reschedule, you may call [facility] at [phone] between the hours of 8:00 am and 5:00 pm, Monday-Friday.     Thank you for choosing me and [facility].  Ellouise Newer , PA-C

## 2021-01-17 NOTE — Progress Notes (Signed)
Chief Complaint: Pancreatic mass, GERD, abdominal pain  HPI:    Sherry Holloway is a 87 year old female with a past medical history as listed below, who was referred to me by Carollee Herter, [name], * for a complaint of pancreatic mass, GERD and abdominal pain.     07/08/2019-07/15/2019 patient was admitted to the hospital in [place] for pancreatic mass.  Per notes that the patient brings with her she initially presented to the ED with complaints of acute epigastric pain and work-up was remarkable for elevated LFTs and lipase.  CT of the abdomen pelvis was concerning for cystlike lesion at the level of the pancreatic head measuring 3 x 2.8 x 2.3 cm.  She was kept inpatient for work-up with ERCP.  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  Surgical oncology was consulted.  They discussed outpatient follow-up with repeat imaging and no plan for surgical intervention given the patient's age and high risk procedure.  Creon was started due to patient's reports of floating, light-colored stools.  GI then performed ERCP with sphincterotomy on 7/29.  Multiple stones were removed.  She was placed on a twice daily PPI for softening esophagitis.     10/19/2020 CBC, amylase and lipase normal.  CMP with elevation in glucose.  Otherwise normal.    10/19/2020 patient saw PCP to establish care, she had recently moved from [place].  Apparently had history of a pancreatic mass that was being worked up in [place], apparently benign but needed follow-up.  Apparently had discussed surgery at some point but patient did not want surgery.  Also discussed reflux, her Omeprazole was increased to 20 mg twice daily.    Today, patient presents to clinic accompanied by her daughter who does assist with history.  They  bring all records as above from [place], from where she moved 3 months ago, with them which is very helpful.  They explained that they followed with gastroenterologist in [place] who could not get the records above and so did not really do any real follow-up.  Recently they had repeat lipase and amylase at the PCP which was normal in November, has had no repeat imaging since diagnosis.  They tell me that pathology came back completely benign so it was thought cancer was less likely.  Patient has been feeling well in general but over the past 3 days has felt a little "blah".  Apparently they are applying some chemo cream to an ankle lesion and have been doing so over the past 3 days and think this may correlate with her new feelings of decrease in energy.      Does discuss that she was having some breakthrough reflux but ever since increasing her Omeprazole to 20 mg twice a day this is better.    Denies fever, chills, weight loss, change in bowel habits, abdominal pain, heartburn or reflux.  Past Medical History:  Diagnosis Date  . Arthritis   . Chicken pox   . Diabetes mellitus without complication ([place])   . Diverticulitis   . GERD (gastroesophageal reflux disease)   . Hyperlipidemia   . Hypertension   . OSA (obstructive sleep apnea)   . Urine incontinence   . Uterine cancer [facility])     Past Surgical History:  Procedure Laterality Date  . ABDOMINAL HYSTERECTOMY  1972  .  APPENDECTOMY    . CHOLECYSTECTOMY    . SPINAL CORD STIMULATOR IMPLANT      Current Outpatient Medications  Medication Sig Dispense Refill  . acetaminophen (TYLENOL) 325 MG tablet Take 650 mg by mouth every 6 (six) hours as needed. 1-2 tablets PRN for pain    . aspirin EC 81 MG tablet Take 81 mg by mouth daily. Swallow whole.    . bumetanide (BUMEX) 1 MG tablet Take 1 tablet (1 mg total) by mouth daily. Take 1/2 to 1 tablet daily for swelling 90 tablet 1  . Docusate Sodium (COLACE PO) Take by mouth. PRN for constipation     . famciclovir (FAMVIR) 125 MG tablet Take 1 tablet (125 mg total) by mouth daily. 90 tablet 3  . fluorouracil (EFUDEX) 5 % cream Apply topically 2 (two) times daily. 40 g 0  . FLUoxetine (PROZAC) 10 MG capsule Take 10 mg by mouth daily.    Marland Kitchen glimepiride (AMARYL) 4 MG tablet Take 1 tablet (4 mg total) by mouth daily before breakfast. 90 tablet 1  . Ibuprofen (MOTRIN PO) Take by mouth. PRN    . latanoprost (XALATAN) 0.005 % ophthalmic solution 1 drop at bedtime.    . lipase/protease/amylase (CREON) 36000 UNITS CPEP capsule Take 1 capsule (36,000 Units total) by mouth 3 (three) times daily before meals. 270 capsule 1  . mirtazapine (REMERON) 15 MG tablet Take 1 tablet (15 mg total) by mouth at bedtime. 30 tablet 0  . morphine (MS CONTIN) 30 MG 12 hr tablet Take 30 mg by mouth every 12 (twelve) hours.    . Multiple Vitamins-Minerals (WOMENS 50+ MULTI VITAMIN/MIN PO) Take by mouth.    Marland Kitchen omeprazole (PRILOSEC) 20 MG capsule Take 1 capsule (20 mg total) by mouth 2 (two) times daily before a meal. 180 capsule 3  . predniSONE (DELTASONE) 20 MG tablet Take 2 tablets (40 mg total) by mouth daily. 10 tablet 0  . psyllium (METAMUCIL) 58.6 % packet Take 1 packet by mouth daily.    . valsartan (DIOVAN) 40 MG tablet Take 40 mg by mouth daily. Take 1/2 tablet one day, then a whole tablet the next day     No current facility-administered medications for this visit.    Allergies as of 01/17/2021 - Review Complete 01/17/2021  Allergen Reaction Noted  . Bacitra-neomycin-polymyxin-hc  01/17/2021  . Sulfa antibiotics  10/19/2020  . Wound dressing adhesive  10/19/2020    No family history on file.  Social History   Socioeconomic History  . Marital status: Widowed    Spouse name: Not on file  . Number of children: Not on file  . Years of education: Not on file  . Highest education level: Not on file  Occupational History  . Not on file  Tobacco Use  . Smoking status: Former Research scientist (life sciences)  . Smokeless  tobacco: Never Used  Substance and Sexual Activity  . Alcohol use: Not Currently  . Drug use: Never  . Sexual activity: Not Currently  Other Topics Concern  . Not on file  Social History Narrative   Just moved from [place]--- and moved into an apt 2.4 miles from her daughter    Social Determinants of Health   Financial Resource Strain: Not on file  Food Insecurity: Not on file  Transportation Needs: Not on file  Physical Activity: Not on file  Stress: Not on file  Social Connections: Not on file  Intimate Partner Violence: Not on file    Review of Systems:  Constitutional: No weight loss, fever or chills Skin: No rash  Cardiovascular: No chest pain Respiratory: No SOB  Gastrointestinal: See HPI and otherwise negative Genitourinary: No dysuria Neurological: No headache, dizziness or syncope Musculoskeletal: No new muscle or joint pain Hematologic: No bleeding  Psychiatric: No history of depression or anxiety   Physical Exam:  Vital signs: BP (!) 102/50 (BP Location: Left Arm, Patient Position: Sitting, Cuff Size: Normal)   Pulse 64   Ht 5\' 4"  (1.626 m) Comment: height measured without shoes  Wt 161 lb 4 oz (73.1 kg)   BMI 27.68 kg/m   Constitutional:   Pleasant Elderly Caucasian female appears to be in NAD, Well developed, Well nourished, alert and cooperative Head:  Normocephalic and atraumatic. Eyes:   PEERL, EOMI. No icterus. Conjunctiva pink. Ears:  Normal auditory acuity. Neck:  Supple Throat: Oral cavity and pharynx without inflammation, swelling or lesion.  Respiratory: Respirations even and unlabored. Lungs clear to auscultation bilaterally.   No wheezes, crackles, or rhonchi.  Cardiovascular: Normal S1, S2. No MRG. Regular rate and rhythm. No peripheral edema, cyanosis or pallor.  Gastrointestinal:  Soft, nondistended, nontender. No rebound or guarding. Normal bowel sounds. No appreciable masses or hepatomegaly. Rectal:  Not performed.  Msk:  Symmetrical  without gross deformities. Without edema, no deformity or joint abnormality. +ambulates with walker Neurologic:  Alert and  oriented x4;  grossly normal neurologically.  Skin:   Dry and intact without significant lesions or rashes. Psychiatric:  Demonstrates good judgement and reason without abnormal affect or behaviors.  RELEVANT LABS AND IMAGING: CBC    Component Value Date/Time   WBC 8.8 10/19/2020 1430   RBC 4.92 10/19/2020 1430   HGB 13.5 10/19/2020 1430   HCT 42.0 10/19/2020 1430   PLT 215 10/19/2020 1430   MCV 85.4 10/19/2020 1430   MCH 27.4 10/19/2020 1430   MCHC 32.1 10/19/2020 1430   RDW 13.0 10/19/2020 1430   LYMPHSABS 3,177 10/19/2020 1430   EOSABS 220 10/19/2020 1430   BASOSABS 53 10/19/2020 1430    CMP     Component Value Date/Time   NA 139 10/19/2020 1430   K 4.4 10/19/2020 1430   CL 101 10/19/2020 1430   CO2 30 10/19/2020 1430   GLUCOSE 111 (H) 10/19/2020 1430   BUN 21 10/19/2020 1430   CREATININE 0.90 (H) 10/19/2020 1430   CALCIUM 10.0 10/19/2020 1430   PROT 6.9 10/19/2020 1430   AST 11 10/19/2020 1430   ALT 19 10/19/2020 1430   BILITOT 0.3 10/19/2020 1430    Assessment: 1.  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  GERD: Controlled with omeprazole 20 twice a day  Plan: 1.  I think repeating a CT of the abdomen with contrast and pancreatic protocol is reasonable given that it has been a year and a half since initial diagnosis and no repeat imaging has been done. 2.  Continue Creon for now, this was started a year and a half ago due to some light-colored stools and patient is doing well with it. 3.  Continue Omeprazole 20 mg twice daily. 4.  Patient was assigned to Dr. Rush Landmark today as he specializes in this area.  Ellouise Newer, PA-C [facility] 01/17/2021, 11:32  AM  Cc: Carollee Herter, [name], *

## 2021-01-18 ENCOUNTER — Encounter: Payer: Self-pay | Admitting: Family Medicine

## 2021-01-18 NOTE — Telephone Encounter (Signed)
Tiffany states she would hold on referral till tomorrow. Please call back with information.

## 2021-01-18 NOTE — Telephone Encounter (Signed)
Tiffany states she spoke to [name] (patient daughter) they agree is better to  closed referral. States it would be a lot easier to go ahead an order a new home sleep study and then refer back to them.

## 2021-01-19 ENCOUNTER — Other Ambulatory Visit: Payer: Self-pay | Admitting: Family Medicine

## 2021-01-19 DIAGNOSIS — G4730 Sleep apnea, unspecified: Secondary | ICD-10-CM

## 2021-01-19 MED ORDER — GLUCOSE BLOOD VI STRP: ORAL_STRIP | 12 refills | Status: DC

## 2021-01-19 NOTE — Telephone Encounter (Signed)
Please advise regarding new home sleep study?

## 2021-01-19 NOTE — Telephone Encounter (Signed)
Referral placed for neuro for sleep study

## 2021-01-19 NOTE — Telephone Encounter (Signed)
Mychart message sent.

## 2021-01-20 NOTE — Progress Notes (Signed)
Attending Physician's Attestation   I have reviewed the chart.   I agree with the Advanced Practitioner's note, impression, and recommendations with any updates as below. Interesting case and agree that updated imaging can guide further discussions and possible role of re-evaluation for surgical intention but at her current age that would be very unlikely.  Please update me when results return.   Justice Britain, MD [facility] Office # [phone]

## 2021-01-23 ENCOUNTER — Encounter: Payer: Self-pay | Admitting: Family Medicine

## 2021-01-24 NOTE — Telephone Encounter (Signed)
Med on med list without a providers name. Please advise

## 2021-01-24 NOTE — Telephone Encounter (Signed)
Eye dr needs to rx those --- they treat glaucoma

## 2021-01-25 ENCOUNTER — Ambulatory Visit (HOSPITAL_COMMUNITY)
Admission: RE | Admit: 2021-01-25 | Discharge: 2021-01-25 | Disposition: A | Discharge status: Home / Self Care | Payer: Medicare Other | Source: Ambulatory Visit | Attending: Physician Assistant | Admitting: Physician Assistant

## 2021-01-25 ENCOUNTER — Other Ambulatory Visit: Payer: Self-pay

## 2021-01-25 DIAGNOSIS — K8689 Other specified diseases of pancreas: Secondary | ICD-10-CM | POA: Insufficient documentation

## 2021-01-25 MED ORDER — IOHEXOL 300 MG/ML  SOLN
100.0000 mL | Freq: Once | INTRAMUSCULAR | Status: AC | PRN
  Administered 2021-01-25: 100 mL via INTRAVENOUS

## 2021-01-29 ENCOUNTER — Other Ambulatory Visit: Payer: Self-pay

## 2021-01-29 DIAGNOSIS — I6529 Occlusion and stenosis of unspecified carotid artery: Secondary | ICD-10-CM

## 2021-01-29 DIAGNOSIS — K8689 Other specified diseases of pancreas: Secondary | ICD-10-CM

## 2021-02-02 ENCOUNTER — Other Ambulatory Visit (INDEPENDENT_AMBULATORY_CARE_PROVIDER_SITE_OTHER): Payer: Medicare Other

## 2021-02-02 DIAGNOSIS — K8689 Other specified diseases of pancreas: Secondary | ICD-10-CM | POA: Diagnosis not present

## 2021-02-02 LAB — HEPATIC FUNCTION PANEL
ALT: 11 U/L (ref 0–35)
AST: 9 U/L (ref 0–37)
Albumin: 3.6 g/dL (ref 3.5–5.2)
Alkaline Phosphatase: 74 U/L (ref 39–117)
Bilirubin, Direct: 0.1 mg/dL (ref 0.0–0.3)
Total Bilirubin: 0.4 mg/dL (ref 0.2–1.2)
Total Protein: 6.6 g/dL (ref 6.0–8.3)

## 2021-02-06 ENCOUNTER — Other Ambulatory Visit: Payer: Self-pay

## 2021-02-06 ENCOUNTER — Encounter: Payer: Self-pay | Admitting: Family Medicine

## 2021-02-06 ENCOUNTER — Ambulatory Visit (INDEPENDENT_AMBULATORY_CARE_PROVIDER_SITE_OTHER): Payer: Medicare Other | Admitting: Family Medicine

## 2021-02-06 VITALS — BP 130/62 | HR 59 | Temp 99.2°F | Resp 20 | Ht 64.0 in | Wt 162.4 lb

## 2021-02-06 DIAGNOSIS — I6529 Occlusion and stenosis of unspecified carotid artery: Secondary | ICD-10-CM | POA: Diagnosis not present

## 2021-02-06 DIAGNOSIS — E1165 Type 2 diabetes mellitus with hyperglycemia: Secondary | ICD-10-CM | POA: Insufficient documentation

## 2021-02-06 LAB — COMPREHENSIVE METABOLIC PANEL
ALT: 12 U/L (ref 0–35)
AST: 9 U/L (ref 0–37)
Albumin: 3.5 g/dL (ref 3.5–5.2)
Alkaline Phosphatase: 69 U/L (ref 39–117)
BUN: 20 mg/dL (ref 6–23)
CO2: 29 mEq/L (ref 19–32)
Calcium: 9.3 mg/dL (ref 8.4–10.5)
Chloride: 101 mEq/L (ref 96–112)
Creatinine, Ser: 0.87 mg/dL (ref 0.40–1.20)
GFR: 58.42 mL/min — ABNORMAL LOW (ref >60.00)
Glucose, Bld: 167 mg/dL — ABNORMAL HIGH (ref 70–99)
Potassium: 4.3 mEq/L (ref 3.5–5.1)
Sodium: 136 mEq/L (ref 135–145)
Total Bilirubin: 0.3 mg/dL (ref 0.2–1.2)
Total Protein: 6.1 g/dL (ref 6.0–8.3)

## 2021-02-06 LAB — LIPID PANEL
Cholesterol: 146 mg/dL (ref 0–200)
HDL: 52.7 mg/dL (ref >39.00)
LDL Cholesterol: 58 mg/dL (ref 0–99)
NonHDL: 93.59
Total CHOL/HDL Ratio: 3
Triglycerides: 179 mg/dL — ABNORMAL HIGH (ref 0.0–149.0)
VLDL: 35.8 mg/dL (ref 0.0–40.0)

## 2021-02-06 LAB — HEMOGLOBIN A1C: Hgb A1c MFr Bld: 8.1 % — ABNORMAL HIGH (ref 4.6–6.5)

## 2021-02-06 NOTE — Patient Instructions (Signed)

## 2021-02-06 NOTE — Progress Notes (Signed)
Patient ID: Sherry Holloway, female    DOB: 03/29/1928  Age: 87 y.o. MRN: [id number]    Subjective:  Subjective  HPI Sherry Holloway presents for dm f/u.   No other new complaints today   Review of Systems  Constitutional: Negative for appetite change, diaphoresis, fatigue and unexpected weight change.  Eyes: Negative for pain, redness and visual disturbance.  Respiratory: Negative for cough, chest tightness, shortness of breath and wheezing.   Cardiovascular: Negative for chest pain, palpitations and leg swelling.  Endocrine: Negative for cold intolerance, heat intolerance, polydipsia, polyphagia and polyuria.  Genitourinary: Negative for difficulty urinating, dysuria and frequency.  Neurological: Negative for dizziness, light-headedness, numbness and headaches.    History Past Medical History:  Diagnosis Date  . Arthritis   . Chicken pox   . Diabetes mellitus without complication ([place])   . Diverticulitis   . GERD (gastroesophageal reflux disease)   . Hyperlipidemia   . Hypertension   . OSA (obstructive sleep apnea)   . Urine incontinence   . Uterine cancer [facility])     She has a past surgical history that includes Appendectomy; Cholecystectomy; Abdominal hysterectomy (1972); Spinal cord stimulator implant; Tonsillectomy; Lumbar laminectomy; and Total knee arthroplasty (Right).   Her family history includes Diabetes in her daughter, mother, and son; Heart disease in her father; Hypertension in her daughter, daughter, son, and son; Lung cancer in her sister and son.She reports that she quit smoking about 32 years ago. Her smoking use included cigarettes. She has never used smokeless tobacco. She reports previous alcohol use. She reports that she does not use drugs.  Current Outpatient Medications on File Prior to Visit  Medication Sig Dispense Refill  . acetaminophen (TYLENOL) 325 MG tablet Take 650 mg by mouth every 6 (six) hours as needed. 1-2 tablets PRN for pain    .  aspirin EC 81 MG tablet Take 81 mg by mouth daily. Swallow whole.    . bumetanide (BUMEX) 1 MG tablet Take 1 tablet (1 mg total) by mouth daily. Take 1/2 to 1 tablet daily for swelling 90 tablet 1  . Docusate Sodium (COLACE PO) Take by mouth. PRN for constipation    . famciclovir (FAMVIR) 125 MG tablet Take 1 tablet (125 mg total) by mouth daily. 90 tablet 3  . fluorouracil (EFUDEX) 5 % cream Apply topically 2 (two) times daily. 40 g 0  . glimepiride (AMARYL) 4 MG tablet Take 1 tablet (4 mg total) by mouth daily before breakfast. 90 tablet 1  . glucose blood test strip Check blood sugars twice daily 200 each 12  . Ibuprofen (MOTRIN PO) Take by mouth. PRN    . latanoprost (XALATAN) 0.005 % ophthalmic solution 1 drop at bedtime.    . lipase/protease/amylase (CREON) 36000 UNITS CPEP capsule Take 1 capsule (36,000 Units total) by mouth 3 (three) times daily before meals. 270 capsule 1  . mirtazapine (REMERON) 15 MG tablet Take 1 tablet (15 mg total) by mouth at bedtime. 30 tablet 0  . morphine (MS CONTIN) 30 MG 12 hr tablet Take 30 mg by mouth every 12 (twelve) hours.    . Multiple Vitamins-Minerals (WOMENS 50+ MULTI VITAMIN/MIN PO) Take by mouth.    Marland Kitchen omeprazole (PRILOSEC) 20 MG capsule Take 1 capsule (20 mg total) by mouth 2 (two) times daily before a meal. 180 capsule 3  . psyllium (METAMUCIL) 58.6 % packet Take 1 packet by mouth daily.    . valsartan (DIOVAN) 40 MG tablet Take 40 mg by  mouth daily. Take 1/2 tablet one day, then a whole tablet the next day     No current facility-administered medications on file prior to visit.     Objective:  Objective  Physical Exam Vitals and nursing note reviewed.  Constitutional:      Appearance: She is well-developed and well-nourished.  HENT:     Head: Normocephalic and atraumatic.  Eyes:     Extraocular Movements: EOM normal.     Conjunctiva/sclera: Conjunctivae normal.  Neck:     Thyroid: No thyromegaly.     Vascular: No carotid bruit or JVD.   Cardiovascular:     Rate and Rhythm: Normal rate and regular rhythm.     Heart sounds: Normal heart sounds. No murmur heard.   Pulmonary:     Effort: Pulmonary effort is normal. No respiratory distress.     Breath sounds: Normal breath sounds. No wheezing or rales.  Chest:     Chest wall: No tenderness.  Musculoskeletal:        General: No edema.     Cervical back: Normal range of motion and neck supple.  Neurological:     Mental Status: She is alert and oriented to person, place, and time.  Psychiatric:        Mood and Affect: Mood and affect normal.    BP 130/62 (BP Location: Right Arm, Patient Position: Sitting, Cuff Size: Normal)   Pulse (!) 59   Temp 99.2 F (37.3 C) (Oral)   Resp 20   Ht 5\' 4"  (1.626 m)   Wt 162 lb 6.4 oz (73.7 kg)   SpO2 94%   BMI 27.88 kg/m  Wt Readings from Last 3 Encounters:  02/06/21 162 lb 6.4 oz (73.7 kg)  01/17/21 161 lb 4 oz (73.1 kg)  12/22/20 164 lb (74.4 kg)     Lab Results  Component Value Date   WBC 8.8 10/19/2020   HGB 13.5 10/19/2020   HCT 42.0 10/19/2020   PLT 215 10/19/2020   GLUCOSE 264 (H) 01/17/2021   CHOL 168 10/19/2020   TRIG 246 (H) 10/19/2020   HDL 56 10/19/2020   LDLCALC 78 10/19/2020   ALT 11 02/02/2021   AST 9 02/02/2021   NA 133 (L) 01/17/2021   K 4.3 01/17/2021   CL 98 01/17/2021   CREATININE 0.88 01/17/2021   BUN 20 01/17/2021   CO2 30 01/17/2021   HGBA1C 8.3 (H) 10/19/2020   MICROALBUR 3.8 10/19/2020    CT ABDOMEN W CONTRAST  Result Date: 01/26/2021 CLINICAL DATA:  History of pancreatic mass EXAM: CT ABDOMEN WITH CONTRAST TECHNIQUE: Multidetector CT imaging of the abdomen was performed using the standard protocol following bolus administration of intravenous contrast. CONTRAST:  152mL OMNIPAQUE IOHEXOL 300 MG/ML SOLN some additional oral enteric contrast COMPARISON:  None. FINDINGS: Lower chest: Nodular appearing consolidation of the dependent left lung base measuring 2.4 x 1.8 cm (series 10, image  9). Coronary artery calcifications and/or stents. Hepatobiliary: No focal liver abnormality is seen. Status post cholecystectomy. There is gross intra and extrahepatic biliary ductal dilatation, the central common bile duct appearing to contain a poorly calcified calculus or soft tissue nodule measuring approximately 1.9 cm (series 17, image 56, series 7, image 42). Pancreas: There is diffuse atrophy of the pancreatic parenchyma and dilatation of the main pancreatic duct, the duct measuring up to 1.4 cm in caliber. There is a hypodense mass or gross pancreatic ductal dilatation within the pancreatic head, measuring 3.5 x 2.0 x 2.0 cm (series 7,  image 52, series 5, image 56). Spleen: Normal in size without focal abnormality. Adrenals/Urinary Tract: Adrenal glands are unremarkable. Kidneys are normal, without renal calculi, focal lesion, or hydronephrosis. Bladder is unremarkable. Stomach/Bowel: Stomach is within normal limits. No evidence of bowel wall thickening, distention, or inflammatory changes. Large burden of stool throughout the colon. Vascular/Lymphatic: Aortic atherosclerosis. No enlarged abdominal lymph nodes. Other: No abdominal wall hernia or abnormality. No abdominopelvic ascites. Musculoskeletal: No acute or significant osseous findings. IMPRESSION: 1. There is gross intra- and extrahepatic biliary ductal dilatation, the central common bile duct appearing to contain a poorly calcified calculus or soft tissue nodule measuring approximately 1.9 cm. 2. There is diffuse atrophy of the pancreatic parenchyma and dilatation of the main pancreatic duct, the duct measuring up to 1.4 cm in caliber. There is a hypodense mass or gross pancreatic ductal dilatation within the pancreatic head, measuring 3.5 x 2.0 x 2.0 cm. This may reflect treated appearance of a pancreatic adenocarcinoma or alternately a cystic pancreatic lesion such as a large IPMN or other cystic neoplasm. Comparison to prior imaging, if  available, would be helpful to assess for change. 3. Status post cholecystectomy. 4. Nodular appearing consolidation of the dependent left lung base measuring 2.4 x 1.8 cm. This is nonspecific and may be infectious, inflammatory, or neoplastic. Again, comparison to prior imaging would be helpful. 5. Coronary artery disease. Aortic Atherosclerosis (ICD10-I70.0). Electronically Signed   By: Eddie Candle M.D.   On: 01/26/2021 12:13     Assessment & Plan:  Plan  I have discontinued Aixa Nghiem's FLUoxetine and predniSONE. I am also having her maintain her aspirin EC, psyllium, morphine, valsartan, Multiple Vitamins-Minerals (WOMENS 50+ MULTI VITAMIN/MIN PO), Ibuprofen (MOTRIN PO), acetaminophen, Docusate Sodium (COLACE PO), omeprazole, glimepiride, mirtazapine, lipase/protease/amylase, bumetanide, famciclovir, latanoprost, fluorouracil, and glucose blood.  No orders of the defined types were placed in this encounter.   Problem List Items Addressed This Visit   None   Visit Diagnoses    Type 2 diabetes mellitus with hyperglycemia, without long-term current use of insulin (HCC)    -  Primary   Relevant Orders   Lipid panel   Hemoglobin A1c   Comprehensive metabolic panel      Follow-up: Return in about 6 months (around 08/06/2021).  Ann Held, DO

## 2021-02-06 NOTE — Assessment & Plan Note (Signed)
Recheck labs today hgba1c to be checked today, minimize simple carbs. Increase exercise as tolerated. Continue current meds

## 2021-02-13 ENCOUNTER — Ambulatory Visit (HOSPITAL_COMMUNITY)
Admission: RE | Admit: 2021-02-13 | Discharge: 2021-02-13 | Disposition: A | Discharge status: Home / Self Care | Payer: Medicare Other | Source: Ambulatory Visit | Attending: Vascular Surgery | Admitting: Vascular Surgery

## 2021-02-13 ENCOUNTER — Other Ambulatory Visit: Payer: Self-pay

## 2021-02-13 ENCOUNTER — Ambulatory Visit (INDEPENDENT_AMBULATORY_CARE_PROVIDER_SITE_OTHER): Payer: Medicare Other | Admitting: Vascular Surgery

## 2021-02-13 ENCOUNTER — Encounter: Payer: Self-pay | Admitting: Vascular Surgery

## 2021-02-13 DIAGNOSIS — I771 Stricture of artery: Secondary | ICD-10-CM | POA: Diagnosis not present

## 2021-02-13 DIAGNOSIS — I6529 Occlusion and stenosis of unspecified carotid artery: Secondary | ICD-10-CM | POA: Insufficient documentation

## 2021-02-13 NOTE — Progress Notes (Signed)
Patient name: Sherry Holloway MRN: [id number] DOB: 11-22-1928 Sex: female  REASON FOR CONSULT: Evaluate unequal blood pressures in upper extremities  HPI: Sherry Holloway is a 87 y.o. female, with history of hypertension and diabetes that presents for evaluation of unequal blood pressures in her upper extremities.  Patient is here with her daughter who is a Equities trader and noted that her blood pressure in the left arm was about 60-80 mm Hg lower.  She states when the pressure in the right arm was 160 the pressure in the left arm would be 82-50 systolic.  Sherry Holloway has no prior knowledge of this.  She denies any left arm fatigue, weakness, muscle aches etc.  She also denies any dizziness.  She has moved here from [place].  Past Medical History:  Diagnosis Date  . Arthritis   . Chicken pox   . Diabetes mellitus without complication ([place])   . Diverticulitis   . GERD (gastroesophageal reflux disease)   . Hyperlipidemia   . Hypertension   . OSA (obstructive sleep apnea)   . Urine incontinence   . Uterine cancer [facility])     Past Surgical History:  Procedure Laterality Date  . ABDOMINAL HYSTERECTOMY  1972  . APPENDECTOMY    . CHOLECYSTECTOMY    . LUMBAR LAMINECTOMY    . SPINAL CORD STIMULATOR IMPLANT    . TONSILLECTOMY    . TOTAL KNEE ARTHROPLASTY Right     Family History  Problem Relation Age of Onset  . Diabetes Mother   . Heart disease Father   . Lung cancer Sister   . Lung cancer Son   . Hypertension Son   . Hypertension Daughter   . Diabetes Son   . Hypertension Son   . Diabetes Daughter   . Hypertension Daughter     SOCIAL HISTORY: Social History   Socioeconomic History  . Marital status: Widowed    Spouse name: Not on file  . Number of children: 4  . Years of education: Not on file  . Highest education level: Not on file  Occupational History  . Occupation: Retired Quarry manager  Tobacco Use  . Smoking status: Former Smoker    Types: Cigarettes    Quit  date: 01/17/1989    Years since quitting: 32.0  . Smokeless tobacco: Never Used  Vaping Use  . Vaping Use: Never used  Substance and Sexual Activity  . Alcohol use: Not Currently  . Drug use: Never  . Sexual activity: Not Currently  Other Topics Concern  . Not on file  Social History Narrative   Just moved from [place]--- and moved into an apt 2.4 miles from her daughter    Social Determinants of Health   Financial Resource Strain: Not on file  Food Insecurity: Not on file  Transportation Needs: Not on file  Physical Activity: Not on file  Stress: Not on file  Social Connections: Not on file  Intimate Partner Violence: Not on file    Allergies  Allergen Reactions  . Bacitra-Neomycin-Polymyxin-Hc   . Gramicidin   . Sulfa Antibiotics   . Wound Dressing Adhesive     Current Outpatient Medications  Medication Sig Dispense Refill  . acetaminophen (TYLENOL) 325 MG tablet Take 650 mg by mouth every 6 (six) hours as needed. 1-2 tablets PRN for pain    . aspirin EC 81 MG tablet Take 81 mg by mouth daily. Swallow whole.    . bumetanide (BUMEX) 1 MG tablet Take 1 tablet (1 mg  total) by mouth daily. Take 1/2 to 1 tablet daily for swelling 90 tablet 1  . Docusate Sodium (COLACE PO) Take by mouth. PRN for constipation    . famciclovir (FAMVIR) 125 MG tablet Take 1 tablet (125 mg total) by mouth daily. 90 tablet 3  . fluorouracil (EFUDEX) 5 % cream Apply topically 2 (two) times daily. 40 g 0  . glimepiride (AMARYL) 4 MG tablet Take 1 tablet (4 mg total) by mouth daily before breakfast. 90 tablet 1  . glucose blood test strip Check blood sugars twice daily 200 each 12  . Ibuprofen (MOTRIN PO) Take by mouth. PRN    . latanoprost (XALATAN) 0.005 % ophthalmic solution 1 drop at bedtime.    . lipase/protease/amylase (CREON) 36000 UNITS CPEP capsule Take 1 capsule (36,000 Units total) by mouth 3 (three) times daily before meals. 270 capsule 1  . mirtazapine (REMERON) 15 MG tablet Take 1 tablet (15  mg total) by mouth at bedtime. 30 tablet 0  . morphine (MS CONTIN) 30 MG 12 hr tablet Take 30 mg by mouth every 12 (twelve) hours.    . Multiple Vitamins-Minerals (WOMENS 50+ MULTI VITAMIN/MIN PO) Take by mouth.    Marland Kitchen omeprazole (PRILOSEC) 20 MG capsule Take 1 capsule (20 mg total) by mouth 2 (two) times daily before a meal. 180 capsule 3  . psyllium (METAMUCIL) 58.6 % packet Take 1 packet by mouth daily.    . valsartan (DIOVAN) 40 MG tablet Take 40 mg by mouth daily. Take 1/2 tablet one day, then a whole tablet the next day     No current facility-administered medications for this visit.    REVIEW OF SYSTEMS:  [X]  denotes positive finding, [ ]  denotes negative finding Cardiac  Comments:  Chest pain or chest pressure:    Shortness of breath upon exertion:    Short of breath when lying flat:    Irregular heart rhythm:        Vascular    Pain in calf, thigh, or hip brought on by ambulation: x   Pain in feet at night that wakes you up from your sleep:     Blood clot in your veins:    Leg swelling:         Pulmonary    Oxygen at home:    Productive cough:     Wheezing:         Neurologic    Sudden weakness in arms or legs:     Sudden numbness in arms or legs:     Sudden onset of difficulty speaking or slurred speech:    Temporary loss of vision in one eye:     Problems with dizziness:         Gastrointestinal    Blood in stool:     Vomited blood:         Genitourinary    Burning when urinating:     Blood in urine:        Psychiatric    Major depression:         Hematologic    Bleeding problems:    Problems with blood clotting too easily:        Skin    Rashes or ulcers:        Constitutional    Fever or chills:      PHYSICAL EXAM: Vitals:   02/13/21 1159 02/13/21 1203  BP: (!) 100/53 (!) 168/61  Pulse: (!) 57 (!) 57  Resp: 16   Temp:  97.6 F (36.4 C)   TempSrc: Temporal   SpO2: 94%   Weight: 161 lb (73 kg)   Height: 5\' 4"  (1.626 m)     GENERAL: The  patient is a well-nourished female, in no acute distress. The vital signs are documented above. CARDIAC: There is a regular rate and rhythm.  VASCULAR:  Right radial and brachial pulse bounding and easily palpable Left brachial pulse palpable and a little weaker, harder to appreciate a left radial pulse PULMONARY: No respiratory distress. ABDOMEN: Soft and non-tender. MUSCULOSKELETAL: There are no major deformities or cyanosis. NEUROLOGIC: No focal weakness or paresthesias are detected. SKIN: There are no ulcers or rashes noted. PSYCHIATRIC: The patient has a normal affect.  DATA:   Carotid duplex shows minimal 1 to 39% stenosis bilaterally.  There is retrograde flow in the left vertebral artery.  Assessment/Plan:  87 year old female presents for evaluation of unequal blood pressures in her upper extremities with a 60 to 80 mmHg pressure difference in the left arm that is lower.  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  That being said this is essentially an asymptomatic incidental finding.  She is having no left arm claudication or fatigue and has no posterior basilar insufficiency symptoms like dizziness.  I think we can safely observe this for now.  I do not recommend any surgical intervention.  We discussed follow-up and ultimately her daughter Holloway let me know at the hospital if she developed any symptoms that would warrant reevaluation   Marty Heck, MD [facility]: [phone]

## 2021-02-20 ENCOUNTER — Other Ambulatory Visit: Payer: Self-pay

## 2021-02-20 ENCOUNTER — Ambulatory Visit (INDEPENDENT_AMBULATORY_CARE_PROVIDER_SITE_OTHER): Payer: Medicare Other | Admitting: Podiatry

## 2021-02-20 ENCOUNTER — Encounter: Payer: Self-pay | Admitting: Podiatry

## 2021-02-20 DIAGNOSIS — I6529 Occlusion and stenosis of unspecified carotid artery: Secondary | ICD-10-CM

## 2021-02-20 DIAGNOSIS — D2371 Other benign neoplasm of skin of right lower limb, including hip: Secondary | ICD-10-CM | POA: Diagnosis not present

## 2021-02-20 NOTE — Progress Notes (Signed)
She presents today with her daughter for follow-up of the skin lesion posterior aspect of her right foot.  States that the skin is already sloughed twice over this area she continues to put her medicine on it and it does seem to be getting smaller and it is less painful according to the patient.  Objective: Vitals are stable alert oriented x3 I debrided the reactive hyperkeratotic tissue there was about to slough off of the wound on the posterior medial aspect of the right heel.  There is no erythema cellulitis drainage or odor the wound itself measures 4 mm once debrided completely.  No signs of infection.  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  Plan: At this point she is healing will follow up with her in a few weeks she will continue the use of her Efudex cream.

## 2021-02-26 ENCOUNTER — Other Ambulatory Visit: Payer: Self-pay

## 2021-02-26 ENCOUNTER — Encounter (HOSPITAL_COMMUNITY): Payer: Self-pay | Admitting: Gastroenterology

## 2021-02-26 NOTE — Progress Notes (Signed)
Attempted to obtain medical history via telephone, unable to reach at this time. I left a voicemail to return pre surgical testing department's phone call.  

## 2021-03-01 ENCOUNTER — Other Ambulatory Visit (HOSPITAL_COMMUNITY)
Admission: RE | Admit: 2021-03-01 | Discharge: 2021-03-01 | Disposition: A | Discharge status: Home / Self Care | Payer: Medicare Other | Source: Ambulatory Visit | Attending: Gastroenterology | Admitting: Gastroenterology

## 2021-03-01 DIAGNOSIS — Z01812 Encounter for preprocedural laboratory examination: Secondary | ICD-10-CM | POA: Diagnosis present

## 2021-03-01 DIAGNOSIS — Z20822 Contact with and (suspected) exposure to covid-19: Secondary | ICD-10-CM | POA: Diagnosis not present

## 2021-03-01 LAB — SARS CORONAVIRUS 2 (TAT 6-24 HRS): SARS Coronavirus 2: NEGATIVE

## 2021-03-05 ENCOUNTER — Telehealth: Payer: Self-pay | Admitting: Family Medicine

## 2021-03-05 ENCOUNTER — Ambulatory Visit (HOSPITAL_COMMUNITY): Payer: Medicare Other | Admitting: Certified Registered"

## 2021-03-05 ENCOUNTER — Encounter (HOSPITAL_COMMUNITY): Payer: Self-pay | Admitting: Gastroenterology

## 2021-03-05 ENCOUNTER — Encounter (HOSPITAL_COMMUNITY)
Admission: RE | Disposition: A | Discharge status: Home / Self Care | Payer: Self-pay | Source: Home / Self Care | Attending: Gastroenterology

## 2021-03-05 ENCOUNTER — Ambulatory Visit (HOSPITAL_COMMUNITY): Payer: Medicare Other

## 2021-03-05 ENCOUNTER — Ambulatory Visit (HOSPITAL_COMMUNITY)
Admission: RE | Admit: 2021-03-05 | Discharge: 2021-03-05 | Disposition: A | Discharge status: Home / Self Care | Payer: Medicare Other | Attending: Gastroenterology | Admitting: Gastroenterology

## 2021-03-05 ENCOUNTER — Other Ambulatory Visit: Payer: Self-pay

## 2021-03-05 DIAGNOSIS — Z8249 Family history of ischemic heart disease and other diseases of the circulatory system: Secondary | ICD-10-CM | POA: Insufficient documentation

## 2021-03-05 DIAGNOSIS — Z888 Allergy status to other drugs, medicaments and biological substances status: Secondary | ICD-10-CM | POA: Insufficient documentation

## 2021-03-05 DIAGNOSIS — K8689 Other specified diseases of pancreas: Secondary | ICD-10-CM

## 2021-03-05 DIAGNOSIS — Z8542 Personal history of malignant neoplasm of other parts of uterus: Secondary | ICD-10-CM | POA: Insufficient documentation

## 2021-03-05 DIAGNOSIS — Z882 Allergy status to sulfonamides status: Secondary | ICD-10-CM | POA: Insufficient documentation

## 2021-03-05 DIAGNOSIS — Z801 Family history of malignant neoplasm of trachea, bronchus and lung: Secondary | ICD-10-CM | POA: Diagnosis not present

## 2021-03-05 DIAGNOSIS — E119 Type 2 diabetes mellitus without complications: Secondary | ICD-10-CM | POA: Diagnosis not present

## 2021-03-05 DIAGNOSIS — K3189 Other diseases of stomach and duodenum: Secondary | ICD-10-CM | POA: Diagnosis not present

## 2021-03-05 DIAGNOSIS — C154 Malignant neoplasm of middle third of esophagus: Secondary | ICD-10-CM | POA: Diagnosis not present

## 2021-03-05 DIAGNOSIS — Z87891 Personal history of nicotine dependence: Secondary | ICD-10-CM | POA: Insufficient documentation

## 2021-03-05 DIAGNOSIS — Z833 Family history of diabetes mellitus: Secondary | ICD-10-CM | POA: Diagnosis not present

## 2021-03-05 DIAGNOSIS — R140 Abdominal distension (gaseous): Secondary | ICD-10-CM | POA: Diagnosis present

## 2021-03-05 HISTORY — PX: ENDOSCOPIC RETROGRADE CHOLANGIOPANCREATOGRAPHY (ERCP) WITH PROPOFOL: SHX5810

## 2021-03-05 HISTORY — PX: EUS: SHX5427

## 2021-03-05 HISTORY — PX: BILIARY DILATION: SHX6850

## 2021-03-05 HISTORY — PX: REMOVAL OF STONES: SHX5545

## 2021-03-05 HISTORY — PX: SPYGLASS CHOLANGIOSCOPY: SHX5441

## 2021-03-05 HISTORY — PX: SPYGLASS LITHOTRIPSY: SHX5537

## 2021-03-05 HISTORY — PX: ESOPHAGOGASTRODUODENOSCOPY (EGD) WITH PROPOFOL: SHX5813

## 2021-03-05 HISTORY — PX: BIOPSY: SHX5522

## 2021-03-05 LAB — HEPATIC FUNCTION PANEL
ALT: 18 U/L (ref 0–44)
AST: 13 U/L — ABNORMAL LOW (ref 15–41)
Albumin: 3.6 g/dL (ref 3.5–5.0)
Alkaline Phosphatase: 57 U/L (ref 38–126)
Bilirubin, Direct: 0.1 mg/dL (ref 0.0–0.2)
Indirect Bilirubin: 0.3 mg/dL (ref 0.3–0.9)
Total Bilirubin: 0.4 mg/dL (ref 0.3–1.2)
Total Protein: 6.7 g/dL (ref 6.5–8.1)

## 2021-03-05 LAB — GLUCOSE, CAPILLARY
Glucose-Capillary: 123 mg/dL — ABNORMAL HIGH (ref 70–99)
Glucose-Capillary: 353 mg/dL — ABNORMAL HIGH (ref 70–99)

## 2021-03-05 SURGERY — UPPER ENDOSCOPIC ULTRASOUND (EUS) RADIAL
Anesthesia: General

## 2021-03-05 MED ORDER — DEXAMETHASONE SODIUM PHOSPHATE 10 MG/ML IJ SOLN
INTRAMUSCULAR | Status: DC | PRN
  Administered 2021-03-05: 10 mg via INTRAVENOUS

## 2021-03-05 MED ORDER — SODIUM CHLORIDE 0.9 % IV SOLN: INTRAVENOUS | Status: DC

## 2021-03-05 MED ORDER — ONDANSETRON HCL 4 MG/2ML IJ SOLN
INTRAMUSCULAR | Status: DC | PRN
  Administered 2021-03-05: 4 mg via INTRAVENOUS

## 2021-03-05 MED ORDER — IOPAMIDOL (ISOVUE-300) INJECTION 61%
INTRAVENOUS | Status: DC | PRN
  Administered 2021-03-05: 50 mL

## 2021-03-05 MED ORDER — FENTANYL CITRATE (PF) 100 MCG/2ML IJ SOLN
INTRAMUSCULAR | Status: AC
  Filled 2021-03-05: qty 2

## 2021-03-05 MED ORDER — EPHEDRINE SULFATE-NACL 50-0.9 MG/10ML-% IV SOSY
PREFILLED_SYRINGE | INTRAVENOUS | Status: DC | PRN
  Administered 2021-03-05 (×2): 10 mg via INTRAVENOUS

## 2021-03-05 MED ORDER — PROPOFOL 10 MG/ML IV BOLUS
INTRAVENOUS | Status: DC | PRN
  Administered 2021-03-05: 120 mg via INTRAVENOUS

## 2021-03-05 MED ORDER — FENTANYL CITRATE (PF) 100 MCG/2ML IJ SOLN
INTRAMUSCULAR | Status: DC | PRN
  Administered 2021-03-05: 25 ug via INTRAVENOUS
  Administered 2021-03-05: 50 ug via INTRAVENOUS
  Administered 2021-03-05: 25 ug via INTRAVENOUS

## 2021-03-05 MED ORDER — CIPROFLOXACIN HCL 500 MG PO TABS: 500.0000 mg | ORAL_TABLET | Freq: Two times a day (BID) | ORAL | 0 refills | Status: AC

## 2021-03-05 MED ORDER — INDOMETHACIN 50 MG RE SUPP
RECTAL | Status: DC | PRN
  Administered 2021-03-05: 50 mg via RECTAL

## 2021-03-05 MED ORDER — GLUCAGON HCL RDNA (DIAGNOSTIC) 1 MG IJ SOLR
INTRAMUSCULAR | Status: AC
  Filled 2021-03-05: qty 1

## 2021-03-05 MED ORDER — LIDOCAINE 2% (20 MG/ML) 5 ML SYRINGE
INTRAMUSCULAR | Status: DC | PRN
  Administered 2021-03-05: 60 mg via INTRAVENOUS

## 2021-03-05 MED ORDER — INDOMETHACIN 50 MG RE SUPP
RECTAL | Status: AC
  Filled 2021-03-05: qty 2

## 2021-03-05 MED ORDER — CIPROFLOXACIN IN D5W 400 MG/200ML IV SOLN
INTRAVENOUS | Status: AC
  Filled 2021-03-05: qty 200

## 2021-03-05 MED ORDER — CIPROFLOXACIN IN D5W 400 MG/200ML IV SOLN
INTRAVENOUS | Status: DC | PRN
  Administered 2021-03-05: 400 mg via INTRAVENOUS

## 2021-03-05 MED ORDER — PROPOFOL 10 MG/ML IV BOLUS
INTRAVENOUS | Status: AC
  Filled 2021-03-05: qty 20

## 2021-03-05 MED ORDER — ROCURONIUM BROMIDE 10 MG/ML (PF) SYRINGE
PREFILLED_SYRINGE | INTRAVENOUS | Status: DC | PRN
  Administered 2021-03-05: 10 mg via INTRAVENOUS
  Administered 2021-03-05: 50 mg via INTRAVENOUS
  Administered 2021-03-05: 10 mg via INTRAVENOUS

## 2021-03-05 MED ORDER — LACTATED RINGERS IV SOLN: INTRAVENOUS | Status: DC

## 2021-03-05 MED ORDER — GLUCAGON HCL RDNA (DIAGNOSTIC) 1 MG IJ SOLR
INTRAMUSCULAR | Status: DC | PRN
  Administered 2021-03-05 (×4): .25 mg via INTRAVENOUS

## 2021-03-05 MED ORDER — SUGAMMADEX SODIUM 200 MG/2ML IV SOLN
INTRAVENOUS | Status: DC | PRN
  Administered 2021-03-05: 150 mg via INTRAVENOUS

## 2021-03-05 NOTE — Telephone Encounter (Signed)
She is on valsartan -- it would need to be in creased to 80 mg daily #30

## 2021-03-05 NOTE — Anesthesia Postprocedure Evaluation (Signed)
Anesthesia Post Note  Patient: Engineer, drilling  Procedure(s) Performed: UPPER ENDOSCOPIC ULTRASOUND (EUS) RADIAL (N/A ) ENDOSCOPIC RETROGRADE CHOLANGIOPANCREATOGRAPHY (ERCP) WITH PROPOFOL (N/A ) BIOPSY     Patient location during evaluation: Endoscopy Anesthesia Type: General Level of consciousness: awake and sedated Pain management: pain level controlled Vital Signs Assessment: post-procedure vital signs reviewed and stable Respiratory status: spontaneous breathing Cardiovascular status: stable Postop Assessment: no apparent nausea or vomiting Anesthetic complications: no   No complications documented.  Last Vitals:  Vitals:   03/05/21 1235 03/05/21 1245  BP: (!) 204/53 (!) 198/55  Pulse:  (!) 118  Resp:  14  Temp:    SpO2:  92%    Last Pain:  Vitals:   03/05/21 1245  TempSrc:   PainSc: 0-No pain                 Huston Foley

## 2021-03-05 NOTE — Telephone Encounter (Signed)
Patient states her blood pressure has been running high, Patient would like an increase to blood pressure (lostran) back to 40mg .  Please advise   BP Readings : 200/48,184/50

## 2021-03-05 NOTE — Anesthesia Preprocedure Evaluation (Addendum)
Anesthesia Evaluation  Patient identified by MRN, date of birth, ID band Patient awake    Reviewed: Allergy & Precautions, NPO status , Patient's Chart, lab work & pertinent test results  Airway Mallampati: I       Dental  (+) Edentulous Upper, Edentulous Lower   Pulmonary sleep apnea , former smoker,    Pulmonary exam normal        Cardiovascular hypertension, Pt. on medications + Peripheral Vascular Disease  Normal cardiovascular exam     Neuro/Psych negative psych ROS   GI/Hepatic GERD  Medicated,  Endo/Other  diabetes, Type 2, Oral Hypoglycemic Agents  Renal/GU   negative genitourinary   Musculoskeletal   Abdominal Normal abdominal exam  (+)   Peds  Hematology   Anesthesia Other Findings   Reproductive/Obstetrics                            Anesthesia Physical Anesthesia Plan  ASA: III  Anesthesia Plan: General   Post-op Pain Management:    Induction: Intravenous  PONV Risk Score and Plan:   Airway Management Planned: Oral ETT  Additional Equipment: None  Intra-op Plan:   Post-operative Plan: Extubation in OR  Informed Consent: I have reviewed the patients History and Physical, chart, labs and discussed the procedure including the risks, benefits and alternatives for the proposed anesthesia with the patient or authorized representative who has indicated his/her understanding and acceptance.     Dental advisory given  Plan Discussed with: CRNA  Anesthesia Plan Comments:         Anesthesia Quick Evaluation

## 2021-03-05 NOTE — Transfer of Care (Signed)
Immediate Anesthesia Transfer of Care Note  Patient: Sherry Holloway  Procedure(s) Performed: UPPER ENDOSCOPIC ULTRASOUND (EUS) RADIAL (N/A ) ENDOSCOPIC RETROGRADE CHOLANGIOPANCREATOGRAPHY (ERCP) WITH PROPOFOL (N/A ) BIOPSY  Patient Location: PACU  Anesthesia Type:General  Level of Consciousness: awake, alert  and oriented  Airway & Oxygen Therapy: Patient Spontanous Breathing and Patient connected to face mask oxygen  Post-op Assessment: Report given to RN and Post -op Vital signs reviewed and stable  Post vital signs: Reviewed and stable  Last Vitals:  Vitals Value Taken Time  BP    Temp    Pulse 60 03/05/21 1226  Resp 11 03/05/21 1226  SpO2 100 % 03/05/21 1226  Vitals shown include unvalidated device data.  Last Pain:  Vitals:   03/05/21 0846  TempSrc: Oral  PainSc: 0-No pain         Complications: No complications documented.

## 2021-03-05 NOTE — Anesthesia Procedure Notes (Signed)
Procedure Name: Intubation Date/Time: 03/05/2021 9:41 AM Performed by: Toney Difatta D, CRNA Pre-anesthesia Checklist: Patient identified, Emergency Drugs available, Suction available and Patient being monitored Patient Re-evaluated:Patient Re-evaluated prior to induction Oxygen Delivery Method: Circle system utilized Preoxygenation: Pre-oxygenation with 100% oxygen Induction Type: IV induction Ventilation: Mask ventilation without difficulty Laryngoscope Size: Mac and 3 Grade View: Grade I Tube type: Oral Tube size: 7.0 mm Number of attempts: 1 Airway Equipment and Method: Stylet and Oral airway Placement Confirmation: ETT inserted through vocal cords under direct vision,  positive ETCO2 and breath sounds checked- equal and bilateral Secured at: 21 cm Tube secured with: Tape Dental Injury: Teeth and Oropharynx as per pre-operative assessment

## 2021-03-05 NOTE — Progress Notes (Signed)
Notified anesthesia, Dr Jillyn Hidden, of blood sugar of 353.  Dr Jillyn Hidden stated she should take her oral mediation here, or take immediately when she gets home

## 2021-03-05 NOTE — Discharge Instructions (Signed)
YOU HAD AN ENDOSCOPIC PROCEDURE TODAY: Refer to the procedure report and other information in the discharge instructions given to you for any specific questions about what was found during the examination. If this information does not answer your questions, please call [place] office at [phone] to clarify.   YOU SHOULD EXPECT: Some feelings of bloating in the abdomen. Passage of more gas than usual. Walking can help get rid of the air that was put into your GI tract during the procedure and reduce the bloating. If you had a lower endoscopy (such as a colonoscopy or flexible sigmoidoscopy) you may notice spotting of blood in your stool or on the toilet paper. Some abdominal soreness may be present for a day or two, also.  DIET: Your first meal following the procedure should be a light meal and then it is ok to progress to your normal diet. A half-sandwich or bowl of soup is an example of a good first meal. Heavy or fried foods are harder to digest and may make you feel nauseous or bloated. Drink plenty of fluids but you should avoid alcoholic beverages for 24 hours. If you had a esophageal dilation, please see attached instructions for diet.    ACTIVITY: Your care partner should take you home directly after the procedure. You should plan to take it easy, moving slowly for the rest of the day. You can resume normal activity the day after the procedure however YOU SHOULD NOT DRIVE, use power tools, machinery or perform tasks that involve climbing or major physical exertion for 24 hours (because of the sedation medicines used during the test).   SYMPTOMS TO REPORT IMMEDIATELY: A gastroenterologist can be reached at any hour. Please call [phone]  for any of the following symptoms:   Following upper endoscopy (EGD, EUS, ERCP, esophageal dilation) Vomiting of blood or coffee ground material  New, significant abdominal pain  New, significant chest pain or pain under the shoulder blades  Painful or  persistently difficult swallowing  New shortness of breath  Black, tarry-looking or red, bloody stools  FOLLOW UP:  If any biopsies were taken you will be contacted by phone or by letter within the next 1-3 weeks. Call [phone]  if you have not heard about the biopsies in 3 weeks.  Please also call with any specific questions about appointments or follow up tests.  

## 2021-03-05 NOTE — Progress Notes (Signed)
pts daughter, Stanton Kidney, helped pt dress and take her morning medications.

## 2021-03-05 NOTE — H&P (Signed)
GASTROENTEROLOGY PROCEDURE H&P NOTE   Primary Care Physician: Ann Held, DO  HPI: Sherry Holloway is a 87 y.o. 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  Concern on imaging for intraductal growth v stone material.  Past Medical History:  Diagnosis Date  . Arthritis   . Chicken pox   . Diabetes mellitus without complication ([place])   . Diverticulitis   . GERD (gastroesophageal reflux disease)   . Hyperlipidemia   . Hypertension   . OSA (obstructive sleep apnea)   . Urine incontinence   . Uterine cancer [facility])    Past Surgical History:  Procedure Laterality Date  . ABDOMINAL HYSTERECTOMY  1972  . APPENDECTOMY    . CHOLECYSTECTOMY    . LUMBAR LAMINECTOMY    . SPINAL CORD STIMULATOR IMPLANT    . TONSILLECTOMY    . TOTAL KNEE ARTHROPLASTY Right    Current Facility-Administered Medications  Medication Dose Route Frequency Provider Last Rate Last Admin  . 0.9 %  sodium chloride infusion   Intravenous Continuous Mansouraty, Telford Nab., MD      . lactated ringers infusion   Intravenous Continuous Mansouraty, Telford Nab., MD 125 mL/hr at 03/05/21 0912 New Bag at 03/05/21 0912   Allergies  Allergen Reactions  . Gramicidin Other (See Comments)    unknown  . Sulfa Antibiotics Rash  . Wound Dressing Adhesive Rash   Family History  Problem Relation Age of Onset  . Diabetes Mother   . Heart disease Father   . Lung cancer Sister   . Lung cancer Son   . Hypertension Son   . Hypertension Daughter   . Diabetes Son   . Hypertension Son   . Diabetes Daughter   . Hypertension Daughter    Social History   Socioeconomic History  . Marital status: Widowed    Spouse name: Not on file  . Number of children: 4  . Years of education: Not on file  . Highest education level: Not on file  Occupational History  . Occupation: Retired Quarry manager  Tobacco Use  . Smoking status: Former Smoker    Types:  Cigarettes    Quit date: 01/17/1989    Years since quitting: 32.1  . Smokeless tobacco: Never Used  Vaping Use  . Vaping Use: Never used  Substance and Sexual Activity  . Alcohol use: Not Currently  . Drug use: Never  . Sexual activity: Not Currently  Other Topics Concern  . Not on file  Social History Narrative   Just moved from [place]--- and moved into an apt 2.4 miles from her daughter    Social Determinants of Health   Financial Resource Strain: Not on file  Food Insecurity: Not on file  Transportation Needs: Not on file  Physical Activity: Not on file  Stress: Not on file  Social Connections: Not on file  Intimate Partner Violence: Not on file    Physical Exam: Vital signs in last 24 hours: Temp:  [98.5 F (36.9 C)] 98.5 F (36.9 C) (03/21 0846) Pulse Rate:  [66] 66 (03/21 0846) Resp:  [20] 20 (03/21 0846) BP: (208)/(46) 208/46 (03/21 0846) Weight:  [72.5 kg] 72.5 kg (03/21 0846)   GEN: NAD EYE: Sclerae anicteric ENT: MMM CV: Non-tachycardic GI: Soft, NT/ND NEURO:  Alert & Oriented x 3  Lab Results: No results for input(s): WBC, HGB, HCT, PLT in the last 72 hours. BMET No results for input(s): NA, K,  CL, CO2, GLUCOSE, BUN, CREATININE, CALCIUM in the last 72 hours. LFT No results for input(s): PROT, ALBUMIN, AST, ALT, ALKPHOS, BILITOT, BILIDIR, IBILI in the last 72 hours. PT/INR No results for input(s): LABPROT, INR in the last 72 hours.   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  Concern on imaging for intraductal growth v stone material.  The risks of an EUS including intestinal perforation, bleeding, infection, aspiration, and medication effects were discussed as was the possibility it may not give a definitive diagnosis if a biopsy is performed.  When a biopsy of the pancreas is done as part of the EUS, there is an additional risk of pancreatitis  at the rate of about 1-2%.  It was explained that procedure related pancreatitis is typically mild, although it can be severe and even life threatening, which is why we do not perform random pancreatic biopsies and only biopsy a lesion/area we feel is concerning enough to warrant the risk.  The risks of an ERCP were discussed at length, including but not limited to the risk of perforation, bleeding, abdominal pain, post-ERCP pancreatitis (while usually mild can be severe and even life threatening).  The risks and benefits of endoscopic evaluation were discussed with the patient; these include but are not limited to the risk of perforation, infection, bleeding, missed lesions, lack of diagnosis, severe illness requiring hospitalization, as well as anesthesia and sedation related illnesses.  The patient is agreeable to proceed.    Justice Britain, MD [facility] Office # [phone]

## 2021-03-05 NOTE — Op Note (Signed)
[facility] Patient Name: Sherry Holloway Procedure Date: 03/05/2021 MRN: [id number] Attending MD: Justice Britain , MD Date of Birth: 09/07/1928 CSN: [id number] Age: 87 Admit Type: Outpatient Procedure:                ERCP Indications:              Bile duct stone(s), Abnormal abdominal CT, Biliary                            dilation on Computed Tomogram Scan, Abdominal                            bloating Providers:                Justice Britain, MD, [facility], RN, Laverda Sorenson, Technician, [facility], CRNA Referring MD:             Rosalita Chessman, [place] Erp Medicines:                General Anesthesia, Cipro 400 mg IV, Indomethacin                            100 mg PR, Glucagon 1 mg IV Complications:            No immediate complications. Estimated Blood Loss:     Estimated blood loss was minimal. Procedure:                Pre-Anesthesia Assessment:                           - Prior to the procedure, a History and Physical                            was performed, and patient medications and                            allergies were reviewed. The patient's tolerance of                            previous anesthesia was also reviewed. The risks                            and benefits of the procedure and the sedation                            options and risks were discussed with the patient.                            All questions were answered, and informed consent                            was obtained. Prior Anticoagulants: The patient has  taken no previous anticoagulant or antiplatelet                            agents except for aspirin. ASA Grade Assessment:                            III - A patient with severe systemic disease. After                            reviewing the risks and benefits, the patient was                            deemed in satisfactory condition to  undergo the                            procedure.                           After obtaining informed consent, the scope was                            passed under direct vision. Throughout the                            procedure, the patient's blood pressure, pulse, and                            oxygen saturations were monitored continuously. The                            [employer] D single use                            duodenoscope was introduced through the mouth, and                            used to inject contrast into and used to inject                            contrast into the bile duct. The ERCP was somewhat                            difficult. Successful completion of the procedure                            was aided by performing the maneuvers documented                            (below) in this report. The patient tolerated the                            procedure. Scope In: Scope Out: Findings:      A scout film of the abdomen was obtained. Surgical clips, consistent  with a previous cholecystectomy, were seen in the area of the right       upper quadrant of the abdomen.      The esophagus was successfully intubated under direct vision without       detailed examination of the pharynx, larynx, and associated structures,       and upper GI tract as it was detailed in the EUS report. The major       papilla was located entirely within a diverticulum. A biliary       sphincterotomy had been performed and was open on the pantaloon portion       of the diverticulum. 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      A 0.035 inch x 260 cm straight Hydra Jagwire was passed into the biliary       tree. The Hydratome sphincterotome was passed over the guidewire and the       bile duct was then deeply cannulated. Contrast was injected. I       personally interpreted the bile duct images. Ductal flow of contrast was        adequate. Image quality was adequate. Contrast extended to the hepatic       ducts. Opacification of the entire biliary tree except for the cystic       duct and gallbladder was successful. The main bile duct was severely       dilated. The largest diameter was 18 mm. The lower third of the main       bile duct and middle third of the main bile duct contained filling       defects thought to be stones and sludge. The biliary tree was swept with       a retrieval balloon starting at the bifurcation. Significant sludge was       swept from the duct. Many stones and stone fragments were removed       (largest stone was 12 mm in size at minimum). One stone remained that       would not sweep or trawl. Dilation of the common bile duct with an       07-24-09 mm balloon (to a maximum balloon size of 10 mm) dilator was       successful as sphincteroplasty for up to 4 minutes total in effort of       deliver the stone fragment but was not successful.      Decision made for the bile duct to be explored endoscopically using the       SpyGlass direct visualization system. The SpyScope was advanced to the       bifurcation. Visibility with the scope was good. The main bile duct was       normal. The middle third of the main bile duct contained one stone,       which was 12 mm in diameter. Electrohydraulic lithotripsy was successful       after 254 pulses. The biliary tree was swept with a retrieval balloon       starting at the bifurcation. Sludge was swept from the duct. A few stone       fragments were removed. No stones remained. An occlusion cholangiogram       was performed that showed no further significant biliary pathology.      A pancreatogram was not performed.      The duodenoscope was withdrawn from the patient.  Impression:               - The major papilla was located entirely within a                            pantaloon diverticulum.                           - Prior biliary sphincterotomy  appeared open.                           - Pancreatic duct fishmouth deformity noted.                           - Filling defects consistent with stones and sludge                            were seen on the cholangiogram.                           - The entire main bile duct was severely dilated.                           - Choledocholithiasis was found. Complete removal                            was not accomplished by sweeping initially.                           - Biliary sphincteroplasty performed.                           - Stone remained.                           - Spyglass was performed. EHL performed of the                            biliary stone. No other abnormalities noted on Spy.                           - The biliary tree was swept at end with complete                            removal. Moderate Sedation:      Not Applicable - Patient had care per Anesthesia. Recommendation:           - The patient will be observed post-procedure,                            until all discharge criteria are met.                           - Discharge patient to home.                           -  Patient has a contact number available for                            emergencies. The signs and symptoms of potential                            delayed complications were discussed with the                            patient. Return to normal activities tomorrow.                            Written discharge instructions were provided to the                            patient.                           - Low fat diet.                           - Observe patient's clinical course.                           - Watch for pancreatitis, bleeding, perforation,                            and cholangitis.                           - Patient will be at risk for sludge ball formation                            in future as a result of the significantly dilated                            biliary tree.  Hopefully with the sphincteroplasty                            and the large stone debris now removed we will be                            able to have patient do well moving forward. Procedure Code(s):        --- Professional ---                           [phone], Endoscopic retrograde                            cholangiopancreatography (ERCP); with destruction                            of calculi, any method (eg, mechanical,  electrohydraulic, lithotripsy)                           [id number], Endoscopic cannulation of papilla with                            direct visualization of pancreatic/common bile                            duct(s) (List separately in addition to code(s) for                            primary procedure) Diagnosis Code(s):        --- Professional ---                           K83.8, Other specified diseases of biliary tract                           K80.50, Calculus of bile duct without cholangitis                            or cholecystitis without obstruction                           R14.0, Abdominal distension (gaseous)                           R93.5, Abnormal findings on diagnostic imaging of                            other abdominal regions, including retroperitoneum                           R93.2, Abnormal findings on diagnostic imaging of                            liver and biliary tract CPT copyright 2019 [employer]. All rights reserved. The codes documented in this report are preliminary and upon coder review may  be revised to meet current compliance requirements. Justice Britain, MD 03/05/2021 12:52:00 PM Number of Addenda: 0

## 2021-03-05 NOTE — Op Note (Signed)
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 Estimated Blood Loss:     Estimated blood loss was minimal. Procedure:                Pre-Anesthesia Assessment:                           - Prior to the procedure, a History and Physical                            was performed, and patient medications and                            allergies were reviewed. The patient's tolerance of                            previous anesthesia was also reviewed. The risks                            and benefits of the procedure and the sedation  options and risks were discussed with the patient.                            All questions were answered, and informed consent                            was obtained. Prior Anticoagulants: The patient has                            taken no previous anticoagulant or antiplatelet                            agents except for aspirin. ASA Grade  Assessment:                            III - A patient with severe systemic disease. After                            reviewing the risks and benefits, the patient was                            deemed in satisfactory condition to undergo the                            procedure.                           After obtaining informed consent, the endoscope was                            passed under direct vision. Throughout the                            procedure, the patient's blood pressure, pulse, and                            oxygen saturations were monitored continuously. The                            [id number]-H190 ([id number]) Olympus gastroscope was                            introduced through the mouth, and advanced to the                            second part of duodenum. The [employer] D single use duodenoscope was                            introduced through the mouth, and advanced to the  second part of duodenum. The GF-[id number]-AL5 ([id number])                            Olympus Radial EUS was introduced through the                            mouth, and advanced to the esophagus for ultrasound                            examination. The GF-[id number] ([id number]) Olympus Linear                            EUS was introduced through the mouth, and advanced                            to the duodenum for ultrasound examination from the                            stomach and duodenum. The upper EUS was                            accomplished without difficulty. The patient                            tolerated the procedure. Scope In: Scope Out: Findings:      ENDOSCOPIC FINDING: :      A small, submucosal, ulcerating nodule with no bleeding and no stigmata       of recent bleeding was found in the mid esophagus, 29 to 31 cm from the       incisors. The lesion was non-obstructing and not circumferential.       Biopsies were  taken with a cold forceps for histology.      No other gross lesions were noted in the proximal esophagus and in the       distal esophagus.      The Z-line was irregular and was found 40 cm from the incisors.      A 2 cm hiatal hernia was present.      Patchy mildly erythematous mucosa without bleeding was found in the       gastric body and in the gastric antrum. Biopsies were taken with a cold       forceps for histology and Helicobacter pylori testing.      No gross lesions were noted in the duodenal bulb, in the first portion       of the duodenum and in the second portion of the duodenum.      A medium non-bleeding diverticulum was found in the area of the papilla.      The ampulla was within the diverticulum fully. There was evidence of a       patent biliary sphincterotomy on the pantaloon region. There was a very       large pancreatic duct with fish-mouth deformity noted of the pancreatic       duct itself.      ENDOSONOGRAPHIC FINDING: :      A hypoechoic lesion was found in the middle third of the esophagus. The       lesion  was encountered at 29 cm from the incisors and extended to 31 cm.       The lesion was non-circumferential. The endosonographic borders were       irregular. The lesion measured up to 9.3 mm x 4.3 mm in thickness. There       was sonographic evidence suggesting invasion into the luminal       interface/superficial mucosa (Layer 1), the deep mucosa (Layer 2) and       the submucosa (Layer 3) but the Muscularis Propria did not seem to be       affected by this lesion.      Pancreatic parenchymal abnormalities were noted in the entire pancreas.       These consisted of atrophy.      The pancreatic duct had a dilated endosonographic appearance in the       pancreatic head (12.4 mm -> 11.5 mm), genu of the pancreas (13.7 mm ->       8.9 mm), body of the pancreas (4.0 mm) and tail of the pancreas (3.9 mm).      There was dilation in the common bile duct (10.3  mm). Overt       sludge/debris difficult to determine due to the diverticulum causing air       shadowing in the region.      Endosonographic imaging in the visualized portion of the liver showed no       mass.      No malignant-appearing lymph nodes were visualized in the middle       paraesophageal mediastinum (level 12M), lower paraesophageal mediastinum       (level 8L), celiac region (level 20), peripancreatic region and porta       hepatis region.      The celiac region was visualized. Impression:               EGD Impression:                           - Rule out malignancy, esophageal lesion was found                            in the mid esophagus. Biopsied.                           - No other gross lesions in rest of esophagus.                           - Z-line irregular, 40 cm from the incisors.                           - 2 cm hiatal hernia.                           - Erythematous mucosa in the gastric body and                            antrum. Biopsied.                           - No gross lesions in the duodenal bulb, in the  first portion of the duodenum and in the second                            portion of the duodenum.                           - Non-bleeding duodenal diverticulum with major                            papilla within it completely.                           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                           EUS Impression:                           - A lesion was found in the middle third of the                            esophagus. Tissue was obtained from this exam, and                            results are pending. However, the endosonographic                            appearance is suspicious for  possible malignancy.                            This was staged T1sm N0 Mx by endosonographic                            criteria if malignancy is confirmed.                           - Pancreatic parenchymal abnormalities consisting                            of atrophy were noted in the entire pancreas.                           - The pancreatic duct had a dilated endosonographic                            appearance in the pancreatic head, genu  of the                            pancreas, body of the pancreas and tail of the                            pancreas. 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                           - There was dilation in the common bile duct.                            Diverticulum caused shadowing that decreased                            sensitivity of findings stone disease.                           - No malignant-appearing lymph nodes were                            visualized in the middle paraesophageal mediastinum                            (level 70M), lower paraesophageal mediastinum (level                            8L), celiac region (level 20), peripancreatic                            region and porta hepatis region. Moderate Sedation:      Not Applicable - Patient had care per Anesthesia. Recommendation:           - Proceed to scheduled ERCP.                           - Observe patient's clinical course.                           - Await path results.                           - Continue twice daily Omeprazole at current dosing.                           - If pathology consistent with malignancy then will                            need CT-Chest as well as consideration of PET-CT                            and referral to Oncology. If this is malignant then  consideration of ESD referral may be made based on                            the EUS evaluation today. If not malignant  then                            would relook with EGD in 4-6 weeks and increase PPI                            to twice daily. May still get a CT-Chest however.                           - The findings and recommendations were discussed                            with the patient.                           - The findings and recommendations were discussed                            with the patient's family. Procedure Code(s):        --- Professional ---                           [phone], Esophagogastroduodenoscopy, flexible,                            transoral; with endoscopic ultrasound examination                            limited to the esophagus, stomach or duodenum, and                            adjacent structures                           43239, Esophagogastroduodenoscopy, flexible,                            transoral; with biopsy, single or multiple Diagnosis Code(s):        --- Professional ---                           D49.0, Neoplasm of unspecified behavior of                            digestive system                           K22.8, Other specified diseases of esophagus                           K44.9, Diaphragmatic hernia without obstruction or  gangrene                           K31.89, Other diseases of stomach and duodenum                           Z98.890, Other specified postprocedural states                           K86.9, Disease of pancreas, unspecified                           K83.8, Other specified diseases of biliary tract                           I89.9, Noninfective disorder of lymphatic vessels                            and lymph nodes, unspecified                           K86.89, Other specified diseases of pancreas                           K83.1, Obstruction of bile duct                           K57.10, Diverticulosis of small intestine without                            perforation or abscess without bleeding                            R93.3, Abnormal findings on diagnostic imaging of                            other parts of digestive tract                           R93.5, Abnormal findings on diagnostic imaging of                            other abdominal regions, including retroperitoneum CPT copyright 2019 [employer]. All rights reserved. The codes documented in this report are preliminary and upon coder review may  be revised to meet current compliance requirements. Justice Britain, MD 03/05/2021 12:39:29 PM Number of Addenda: 0

## 2021-03-05 NOTE — Telephone Encounter (Signed)
Are you okay with this adjustment? Would you like to see her in office after this adjustment?

## 2021-03-06 DIAGNOSIS — K8050 Calculus of bile duct without cholangitis or cholecystitis without obstruction: Secondary | ICD-10-CM | POA: Diagnosis not present

## 2021-03-06 DIAGNOSIS — K3189 Other diseases of stomach and duodenum: Secondary | ICD-10-CM

## 2021-03-06 DIAGNOSIS — K838 Other specified diseases of biliary tract: Secondary | ICD-10-CM

## 2021-03-06 DIAGNOSIS — D490 Neoplasm of unspecified behavior of digestive system: Secondary | ICD-10-CM | POA: Diagnosis not present

## 2021-03-06 DIAGNOSIS — K831 Obstruction of bile duct: Secondary | ICD-10-CM | POA: Diagnosis not present

## 2021-03-06 LAB — SURGICAL PATHOLOGY

## 2021-03-06 NOTE — Telephone Encounter (Signed)
Great!

## 2021-03-06 NOTE — Telephone Encounter (Signed)
Spoke with pts daughter Jodette: Pt is taking 1/2 tab of the 40 mg currently. So she will increase to the whole tab and let us know what her BP's look like with this dose.

## 2021-03-07 ENCOUNTER — Encounter (HOSPITAL_COMMUNITY): Payer: Self-pay | Admitting: Gastroenterology

## 2021-03-07 ENCOUNTER — Telehealth: Payer: Self-pay | Admitting: Gastroenterology

## 2021-03-07 DIAGNOSIS — C159 Malignant neoplasm of esophagus, unspecified: Secondary | ICD-10-CM

## 2021-03-07 NOTE — Telephone Encounter (Signed)
  Orders entered for CT scans and oncoogy made.  Schedulers will call to make appt.  Labs have been entered. I spoke with the pt's daughter and she has been advised to call if she has not heard from schedulers in a few days. She will bring the pt in 2 weeks for repeat labs.    Mansouraty, Telford Nab., MD  Timothy Lasso, RN Cc: [facility], [place] I called and spoke with the patient's daughter this afternoon. 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 At time of EUS I did not feel that this lesion invaded the layer for muscularis propria however it most definitely involve the submucosa. Difficult to distinguish SM1 v SM2 disease. With this being said patient's daughter understands findings and would like to move forward with additional work-up. She is going to let us know about the oncologist that she would like her mother to see in the next few days via [employer]. Next steps in her evaluation are as follows:   1) CT chest/pelvis with IV and oral contrast reasonable (likely will need to do abdomen as well even though it was recently done) for clinical staging follow-up  2) once patient's daughter let us know a oncology referral will be placed  3) hepatic function panel and CBC to be drawn in 2 weeks   Thanks.  GM

## 2021-03-07 NOTE — Telephone Encounter (Signed)
Patients daughter called said she spoke with Dr. Rush Landmark yesterday regarding procedure results and he suggested she see an oncologist and asked she call with the information. She said choose Dr. Benay Spice please advise what the next steps would be.

## 2021-03-08 ENCOUNTER — Institutional Professional Consult (permissible substitution): Payer: Medicare Other | Admitting: Neurology

## 2021-03-08 NOTE — Progress Notes (Signed)
I spoke with patient's daughter Cline Crock regarding referral we received from Dr. Rush Landmark for esophageal cancer.  She is have CT on 03/15/2021 so I have scheduled her with Dr. Benay Spice at new [facility] location for Monday 03/19/2021 at 1:40 to arrive at least 20 minutes prior for check in.  She was given the physical address and phone number for this location. She states her mother is having no dysphagia, continuing to maintain her weight, eating well.

## 2021-03-15 ENCOUNTER — Other Ambulatory Visit: Payer: Self-pay

## 2021-03-15 ENCOUNTER — Encounter (HOSPITAL_COMMUNITY): Payer: Self-pay

## 2021-03-15 ENCOUNTER — Ambulatory Visit (HOSPITAL_COMMUNITY)
Admission: RE | Admit: 2021-03-15 | Discharge: 2021-03-15 | Disposition: A | Discharge status: Home / Self Care | Payer: Medicare Other | Source: Ambulatory Visit | Attending: Gastroenterology | Admitting: Gastroenterology

## 2021-03-15 DIAGNOSIS — C159 Malignant neoplasm of esophagus, unspecified: Secondary | ICD-10-CM

## 2021-03-15 DIAGNOSIS — I2699 Other pulmonary embolism without acute cor pulmonale: Secondary | ICD-10-CM | POA: Diagnosis not present

## 2021-03-15 HISTORY — DX: Malignant neoplasm of esophagus, unspecified: C15.9

## 2021-03-15 LAB — POCT I-STAT CREATININE: Creatinine, Ser: 0.8 mg/dL (ref 0.44–1.00)

## 2021-03-15 MED ORDER — IOHEXOL 300 MG/ML  SOLN
75.0000 mL | Freq: Once | INTRAMUSCULAR | Status: AC | PRN
  Administered 2021-03-15: 75 mL via INTRAVENOUS

## 2021-03-16 ENCOUNTER — Emergency Department (HOSPITAL_BASED_OUTPATIENT_CLINIC_OR_DEPARTMENT_OTHER): Payer: Medicare Other

## 2021-03-16 ENCOUNTER — Encounter (HOSPITAL_BASED_OUTPATIENT_CLINIC_OR_DEPARTMENT_OTHER): Payer: Self-pay | Admitting: *Deleted

## 2021-03-16 ENCOUNTER — Inpatient Hospital Stay (HOSPITAL_BASED_OUTPATIENT_CLINIC_OR_DEPARTMENT_OTHER)
Admission: EM | Admit: 2021-03-16 | Discharge: 2021-03-18 | DRG: 176 | Disposition: A | Discharge status: Home / Self Care | Payer: Medicare Other | Attending: Internal Medicine | Admitting: Internal Medicine

## 2021-03-16 ENCOUNTER — Other Ambulatory Visit: Payer: Self-pay

## 2021-03-16 ENCOUNTER — Telehealth: Payer: Self-pay

## 2021-03-16 DIAGNOSIS — Z79891 Long term (current) use of opiate analgesic: Secondary | ICD-10-CM

## 2021-03-16 DIAGNOSIS — Z7982 Long term (current) use of aspirin: Secondary | ICD-10-CM

## 2021-03-16 DIAGNOSIS — Z90710 Acquired absence of both cervix and uterus: Secondary | ICD-10-CM

## 2021-03-16 DIAGNOSIS — K219 Gastro-esophageal reflux disease without esophagitis: Secondary | ICD-10-CM | POA: Diagnosis present

## 2021-03-16 DIAGNOSIS — I119 Hypertensive heart disease without heart failure: Secondary | ICD-10-CM | POA: Diagnosis present

## 2021-03-16 DIAGNOSIS — C159 Malignant neoplasm of esophagus, unspecified: Secondary | ICD-10-CM | POA: Diagnosis present

## 2021-03-16 DIAGNOSIS — Z20822 Contact with and (suspected) exposure to covid-19: Secondary | ICD-10-CM | POA: Diagnosis present

## 2021-03-16 DIAGNOSIS — Z882 Allergy status to sulfonamides status: Secondary | ICD-10-CM | POA: Diagnosis not present

## 2021-03-16 DIAGNOSIS — G8929 Other chronic pain: Secondary | ICD-10-CM | POA: Diagnosis present

## 2021-03-16 DIAGNOSIS — Z7984 Long term (current) use of oral hypoglycemic drugs: Secondary | ICD-10-CM | POA: Diagnosis not present

## 2021-03-16 DIAGNOSIS — Z91048 Other nonmedicinal substance allergy status: Secondary | ICD-10-CM

## 2021-03-16 DIAGNOSIS — E785 Hyperlipidemia, unspecified: Secondary | ICD-10-CM | POA: Diagnosis present

## 2021-03-16 DIAGNOSIS — Z8542 Personal history of malignant neoplasm of other parts of uterus: Secondary | ICD-10-CM | POA: Diagnosis not present

## 2021-03-16 DIAGNOSIS — E119 Type 2 diabetes mellitus without complications: Secondary | ICD-10-CM

## 2021-03-16 DIAGNOSIS — Z87891 Personal history of nicotine dependence: Secondary | ICD-10-CM

## 2021-03-16 DIAGNOSIS — I10 Essential (primary) hypertension: Secondary | ICD-10-CM | POA: Diagnosis present

## 2021-03-16 DIAGNOSIS — Z79899 Other long term (current) drug therapy: Secondary | ICD-10-CM

## 2021-03-16 DIAGNOSIS — I2609 Other pulmonary embolism with acute cor pulmonale: Secondary | ICD-10-CM | POA: Diagnosis not present

## 2021-03-16 DIAGNOSIS — K5900 Constipation, unspecified: Secondary | ICD-10-CM | POA: Diagnosis present

## 2021-03-16 DIAGNOSIS — I2699 Other pulmonary embolism without acute cor pulmonale: Secondary | ICD-10-CM | POA: Diagnosis present

## 2021-03-16 DIAGNOSIS — Z881 Allergy status to other antibiotic agents status: Secondary | ICD-10-CM

## 2021-03-16 DIAGNOSIS — G4733 Obstructive sleep apnea (adult) (pediatric): Secondary | ICD-10-CM | POA: Diagnosis present

## 2021-03-16 DIAGNOSIS — Z9682 Presence of neurostimulator: Secondary | ICD-10-CM | POA: Diagnosis not present

## 2021-03-16 DIAGNOSIS — I2602 Saddle embolus of pulmonary artery with acute cor pulmonale: Secondary | ICD-10-CM | POA: Diagnosis not present

## 2021-03-16 DIAGNOSIS — E1165 Type 2 diabetes mellitus with hyperglycemia: Secondary | ICD-10-CM | POA: Diagnosis present

## 2021-03-16 DIAGNOSIS — Z9049 Acquired absence of other specified parts of digestive tract: Secondary | ICD-10-CM | POA: Diagnosis not present

## 2021-03-16 DIAGNOSIS — Z96651 Presence of right artificial knee joint: Secondary | ICD-10-CM | POA: Diagnosis present

## 2021-03-16 LAB — PROTIME-INR
INR: 0.9 (ref 0.8–1.2)
Prothrombin Time: 12.2 seconds (ref 11.4–15.2)

## 2021-03-16 LAB — COMPREHENSIVE METABOLIC PANEL
ALT: 25 U/L (ref 0–44)
AST: 15 U/L (ref 15–41)
Albumin: 3.9 g/dL (ref 3.5–5.0)
Alkaline Phosphatase: 73 U/L (ref 38–126)
Anion gap: 8 (ref 5–15)
BUN: 18 mg/dL (ref 8–23)
CO2: 28 mmol/L (ref 22–32)
Calcium: 9.5 mg/dL (ref 8.9–10.3)
Chloride: 102 mmol/L (ref 98–111)
Creatinine, Ser: 0.96 mg/dL (ref 0.44–1.00)
GFR, Estimated: 56 mL/min — ABNORMAL LOW (ref >60)
Glucose, Bld: 131 mg/dL — ABNORMAL HIGH (ref 70–99)
Potassium: 4.2 mmol/L (ref 3.5–5.1)
Sodium: 138 mmol/L (ref 135–145)
Total Bilirubin: 0.1 mg/dL — ABNORMAL LOW (ref 0.3–1.2)
Total Protein: 7.2 g/dL (ref 6.5–8.1)

## 2021-03-16 LAB — CBC WITH DIFFERENTIAL/PLATELET
Abs Immature Granulocytes: 0.03 10*3/uL (ref 0.00–0.07)
Basophils Absolute: 0.1 10*3/uL (ref 0.0–0.1)
Basophils Relative: 1 %
Eosinophils Absolute: 0.1 10*3/uL (ref 0.0–0.5)
Eosinophils Relative: 2 %
HCT: 39.8 % (ref 36.0–46.0)
Hemoglobin: 12.9 g/dL (ref 12.0–15.0)
Immature Granulocytes: 0 %
Lymphocytes Relative: 36 %
Lymphs Abs: 2.8 10*3/uL (ref 0.7–4.0)
MCH: 27.3 pg (ref 26.0–34.0)
MCHC: 32.4 g/dL (ref 30.0–36.0)
MCV: 84.1 fL (ref 80.0–100.0)
Monocytes Absolute: 0.4 10*3/uL (ref 0.1–1.0)
Monocytes Relative: 6 %
Neutro Abs: 4.2 10*3/uL (ref 1.7–7.7)
Neutrophils Relative %: 55 %
Platelets: 216 10*3/uL (ref 150–400)
RBC: 4.73 MIL/uL (ref 3.87–5.11)
RDW: 14.8 % (ref 11.5–15.5)
WBC: 7.6 10*3/uL (ref 4.0–10.5)
nRBC: 0 % (ref 0.0–0.2)

## 2021-03-16 LAB — GLUCOSE, CAPILLARY: Glucose-Capillary: 165 mg/dL — ABNORMAL HIGH (ref 70–99)

## 2021-03-16 LAB — RESP PANEL BY RT-PCR (FLU A&B, COVID) ARPGX2
Influenza A by PCR: NEGATIVE
Influenza B by PCR: NEGATIVE
SARS Coronavirus 2 by RT PCR: NEGATIVE

## 2021-03-16 LAB — TROPONIN I (HIGH SENSITIVITY): Troponin I (High Sensitivity): 11 ng/L (ref <18)

## 2021-03-16 LAB — APTT: aPTT: 37 seconds — ABNORMAL HIGH (ref 24–36)

## 2021-03-16 MED ORDER — MORPHINE SULFATE ER 30 MG PO TBCR
30.0000 mg | EXTENDED_RELEASE_TABLET | Freq: Two times a day (BID) | ORAL | Status: DC
Start: 2021-03-17 — End: 2021-03-18
  Administered 2021-03-17 – 2021-03-18 (×3): 30 mg via ORAL
  Filled 2021-03-16 (×3): qty 1

## 2021-03-16 MED ORDER — INSULIN ASPART 100 UNIT/ML ~~LOC~~ SOLN
0.0000 [IU] | Freq: Three times a day (TID) | SUBCUTANEOUS | Status: DC
  Administered 2021-03-17: 3 [IU] via SUBCUTANEOUS
  Administered 2021-03-17 (×2): 1 [IU] via SUBCUTANEOUS
  Administered 2021-03-18: 2 [IU] via SUBCUTANEOUS

## 2021-03-16 MED ORDER — ONDANSETRON HCL 4 MG/2ML IJ SOLN: 4.0000 mg | Freq: Four times a day (QID) | INTRAMUSCULAR | Status: DC | PRN

## 2021-03-16 MED ORDER — IRBESARTAN 75 MG PO TABS
37.5000 mg | ORAL_TABLET | Freq: Every day | ORAL | Status: DC
  Administered 2021-03-17 – 2021-03-18 (×2): 37.5 mg via ORAL
  Filled 2021-03-16 (×2): qty 0.5

## 2021-03-16 MED ORDER — ACETAMINOPHEN 325 MG PO TABS: 650.0000 mg | ORAL_TABLET | Freq: Four times a day (QID) | ORAL | Status: DC | PRN

## 2021-03-16 MED ORDER — DOCUSATE SODIUM 100 MG PO CAPS: 100.0000 mg | ORAL_CAPSULE | Freq: Every day | ORAL | Status: DC

## 2021-03-16 MED ORDER — ONDANSETRON HCL 4 MG PO TABS: 4.0000 mg | ORAL_TABLET | Freq: Four times a day (QID) | ORAL | Status: DC | PRN

## 2021-03-16 MED ORDER — PANCRELIPASE (LIP-PROT-AMYL) 12000-38000 UNITS PO CPEP
36000.0000 [IU] | ORAL_CAPSULE | Freq: Two times a day (BID) | ORAL | Status: DC
  Administered 2021-03-17 – 2021-03-18 (×3): 36000 [IU] via ORAL
  Filled 2021-03-16 (×3): qty 3

## 2021-03-16 MED ORDER — ACETAMINOPHEN 650 MG RE SUPP: 650.0000 mg | Freq: Four times a day (QID) | RECTAL | Status: DC | PRN

## 2021-03-16 MED ORDER — HEPARIN (PORCINE) 25000 UT/250ML-% IV SOLN
1250.0000 [IU]/h | INTRAVENOUS | Status: AC
  Administered 2021-03-16: 1050 [IU]/h via INTRAVENOUS
  Administered 2021-03-17: 1250 [IU]/h via INTRAVENOUS
  Filled 2021-03-16 (×2): qty 250

## 2021-03-16 MED ORDER — HEPARIN BOLUS VIA INFUSION
4000.0000 [IU] | Freq: Once | INTRAVENOUS | Status: AC
  Administered 2021-03-16: 4000 [IU] via INTRAVENOUS

## 2021-03-16 NOTE — Progress Notes (Signed)
Patient admitted from [facility]. Admissions on [facility] was notified.

## 2021-03-16 NOTE — ED Provider Notes (Signed)
[name] EMERGENCY DEPARTMENT Provider Note   CSN: [id number] Arrival date & time: 03/16/21  1421     History No chief complaint on file.   Sherry Holloway is a 87 y.o. female.  Patient presents the emergency department today after having a CT scan which demonstrated pulmonary embolism and partial small bowel structure.  Patient had an endoscopy performed 1 to 2 weeks ago and had a biopsy of the esophagus which returned as invasive esophageal carcinoma.  In preparation to see oncology, patient underwent CT of the chest and CT of the abdomen and pelvis yesterday with these results.  Patient denies abdominal pain, vomiting.  Her last bowel movement was this morning.  She is passing gas.  She had some bloating of her abdomen yesterday.  No urinary symptoms.  In regards to the pulmonary embolism, she has had no chest pain or shortness of breath.  She continues to be very active.  She had a recent fall causing swelling of the right knee, but is ambulatory without any difficulty.  She has some pain in her legs at times bilaterally.        Past Medical History:  Diagnosis Date  . Arthritis   . Chicken pox   . Diabetes mellitus without complication ([place])   . Diverticulitis   . Esophageal cancer ([place]) dx'd 2020  . GERD (gastroesophageal reflux disease)   . Hyperlipidemia   . Hypertension   . OSA (obstructive sleep apnea)   . Urine incontinence   . Uterine cancer [facility])     Patient Active Problem List   Diagnosis Date Noted  . Subclavian arterial stenosis ([place]) 02/13/2021  . Type 2 diabetes mellitus with hyperglycemia, without long-term current use of insulin ([place]) 02/06/2021  . Acquired trigger finger of left middle finger 01/09/2021  . Acquired trigger finger of left ring finger 01/09/2021  . Trigger finger of left hand 12/24/2020  . Plantar wart 12/24/2020  . Pancreatic mass 10/19/2020  . Gastroesophageal reflux disease with esophagitis without hemorrhage 10/19/2020   . Hypertension 10/19/2020  . Diabetes mellitus without complication ([place]) 49/67/5916  . Vaginal odor 10/19/2020  . Hyperlipidemia 10/19/2020  . OSA (obstructive sleep apnea) 10/19/2020  . Chronic bilateral back pain 10/19/2020    Past Surgical History:  Procedure Laterality Date  . ABDOMINAL HYSTERECTOMY  1972  . APPENDECTOMY    . BILIARY DILATION  03/05/2021   Procedure: BILIARY DILATION;  Surgeon: Rush Landmark Telford Nab., MD;  Location: [facility];  Service: Gastroenterology;;  . BIOPSY  03/05/2021   Procedure: BIOPSY;  Surgeon: Irving Copas., MD;  Location: [facility];  Service: Gastroenterology;;  . Lorin Mercy    . ENDOSCOPIC RETROGRADE CHOLANGIOPANCREATOGRAPHY (ERCP) WITH PROPOFOL N/A 03/05/2021   Procedure: ENDOSCOPIC RETROGRADE CHOLANGIOPANCREATOGRAPHY (ERCP) WITH PROPOFOL;  Surgeon: Rush Landmark Telford Nab., MD;  Location: [facility];  Service: Gastroenterology;  Laterality: N/A;  . ESOPHAGOGASTRODUODENOSCOPY (EGD) WITH PROPOFOL N/A 03/05/2021   Procedure: ESOPHAGOGASTRODUODENOSCOPY (EGD) WITH PROPOFOL;  Surgeon: Rush Landmark Telford Nab., MD;  Location: [facility];  Service: Gastroenterology;  Laterality: N/A;  . EUS N/A 03/05/2021   Procedure: UPPER ENDOSCOPIC ULTRASOUND (EUS) RADIAL;  Surgeon: Rush Landmark Telford Nab., MD;  Location: [facility];  Service: Gastroenterology;  Laterality: N/A;  . LUMBAR LAMINECTOMY    . REMOVAL OF STONES  03/05/2021   Procedure: REMOVAL OF STONES;  Surgeon: Rush Landmark Telford Nab., MD;  Location: [facility];  Service: Gastroenterology;;  . SPINAL CORD STIMULATOR IMPLANT    . SPYGLASS CHOLANGIOSCOPY N/A 03/05/2021   Procedure: Bess Kinds  CHOLANGIOSCOPY;  Surgeon: Rush Landmark Telford Nab., MD;  Location: [facility];  Service: Gastroenterology;  Laterality: N/A;  Bess Kinds LITHOTRIPSY N/A 03/05/2021   Procedure: WCHENIDP LITHOTRIPSY;  Surgeon: Irving Copas., MD;  Location: [facility];  Service: Gastroenterology;   Laterality: N/A;  . TONSILLECTOMY    . TOTAL KNEE ARTHROPLASTY Right      OB History   No obstetric history on file.     Family History  Problem Relation Age of Onset  . Diabetes Mother   . Heart disease Father   . Lung cancer Sister   . Lung cancer Son   . Hypertension Son   . Hypertension Daughter   . Diabetes Son   . Hypertension Son   . Diabetes Daughter   . Hypertension Daughter     Social History   Tobacco Use  . Smoking status: Former Smoker    Types: Cigarettes    Quit date: 01/17/1989    Years since quitting: 32.1  . Smokeless tobacco: Never Used  Vaping Use  . Vaping Use: Never used  Substance Use Topics  . Alcohol use: Not Currently  . Drug use: Never    Home Medications Prior to Admission medications   Medication Sig Start Date End Date Taking? Authorizing Provider  acetaminophen (TYLENOL) 325 MG tablet Take 352-650 mg by mouth every 6 (six) hours as needed for mild pain or moderate pain.    [provider]  aspirin EC 81 MG tablet Take 81 mg by mouth daily. Swallow whole.    [provider]  bumetanide (BUMEX) 1 MG tablet Take 1 tablet (1 mg total) by mouth daily. Take 1/2 to 1 tablet daily for swelling Patient taking differently: Take 0.5 mg by mouth daily. for swelling 11/28/20   Carollee Herter, [name], DO  Docusate Sodium (COLACE PO) Take 100 mg by mouth daily.    [provider]  famciclovir (FAMVIR) 125 MG tablet Take 1 tablet (125 mg total) by mouth daily. 12/25/20   Ann Held, DO  fluorouracil (EFUDEX) 5 % cream Apply topically 2 (two) times daily. Patient taking differently: Apply 1 application topically daily. On foot 01/09/21   Hyatt, Max T, DPM  glimepiride (AMARYL) 2 MG tablet Take 2 mg by mouth daily with supper.    [provider]  glimepiride (AMARYL) 4 MG tablet Take 1 tablet (4 mg total) by mouth daily before breakfast. 10/23/20   Carollee Herter, [name], DO  glucose blood test strip Check blood  sugars twice daily 01/19/21   Carollee Herter, [name], DO  Ibuprofen (MOTRIN PO) Take 200-400 mg by mouth daily as needed (Pain).    [provider]  latanoprost (XALATAN) 0.005 % ophthalmic solution Place 1 drop into both eyes at bedtime. 09/18/20   [provider]  lipase/protease/amylase (CREON) 36000 UNITS CPEP capsule Take 1 capsule (36,000 Units total) by mouth 3 (three) times daily before meals. Patient taking differently: Take 36,000 Units by mouth 2 (two) times daily. 11/28/20   Ann Held, DO  mirtazapine (REMERON) 15 MG tablet Take 1 tablet (15 mg total) by mouth at bedtime. Patient taking differently: Take 15 mg by mouth daily with supper. 10/23/20   Ann Held, DO  morphine (MS CONTIN) 30 MG 12 hr tablet Take 30 mg by mouth every 12 (twelve) hours.    [provider]  Multiple Vitamins-Minerals (WOMENS 50+ MULTI VITAMIN/MIN PO) Take 1 tablet by mouth daily with supper.  [provider]  omeprazole (PRILOSEC) 20 MG capsule Take 1 capsule (20 mg total) by mouth 2 (two) times daily before a meal. 10/19/20   Carollee Herter, [name], DO  valsartan (DIOVAN) 40 MG tablet Take 20 mg by mouth daily with breakfast.    [provider]    Allergies    Gramicidin, Sulfa antibiotics, and Wound dressing adhesive  Review of Systems   Review of Systems  Constitutional: Negative for fever.  HENT: Negative for rhinorrhea and sore throat.   Eyes: Negative for redness.  Respiratory: Negative for cough and shortness of breath.   Cardiovascular: Negative for chest pain.  Gastrointestinal: Negative for abdominal pain, diarrhea, nausea and vomiting.  Genitourinary: Negative for dysuria, frequency, hematuria and urgency.  Musculoskeletal: Negative for myalgias.  Skin: Negative for rash.  Neurological: Negative for headaches.    Physical Exam Updated Vital Signs BP (!) 175/53   Pulse 63   Temp 98.3 F (36.8 C) (Oral)   Resp 18   Ht 5'  3" (1.6 m)   Wt 72.5 kg   SpO2 95%   BMI 28.31 kg/m   Physical Exam Vitals and nursing note reviewed.  Constitutional:      Appearance: She is well-developed. She is not diaphoretic.  HENT:     Head: Normocephalic and atraumatic.     Mouth/Throat:     Mouth: Mucous membranes are not dry.  Eyes:     Conjunctiva/sclera: Conjunctivae normal.  Neck:     Vascular: Normal carotid pulses. No carotid bruit or JVD.     Trachea: Trachea normal. No tracheal deviation.  Cardiovascular:     Rate and Rhythm: Normal rate and regular rhythm.     Pulses: No decreased pulses.     Heart sounds: Normal heart sounds, S1 normal and S2 normal. No murmur heard.   Pulmonary:     Effort: Pulmonary effort is normal. No respiratory distress.     Breath sounds: No wheezing.  Chest:     Chest wall: No tenderness.  Abdominal:     General: Bowel sounds are normal.     Palpations: Abdomen is soft.     Tenderness: There is no abdominal tenderness. There is no guarding or rebound.  Musculoskeletal:        General: Normal range of motion.     Cervical back: Normal range of motion and neck supple. No muscular tenderness.  Skin:    General: Skin is warm and dry.     Coloration: Skin is not pale.  Neurological:     Mental Status: She is alert.     ED Results / Procedures / Treatments   Labs (all labs ordered are listed, but only abnormal results are displayed) Labs Reviewed  COMPREHENSIVE METABOLIC PANEL - Abnormal; Notable for the following components:      Result Value   Glucose, Bld 131 (*)    Total Bilirubin 0.1 (*)    GFR, Estimated 56 (*)    All other components within normal limits  APTT - Abnormal; Notable for the following components:   aPTT 37 (*)    All other components within normal limits  CBC WITH DIFFERENTIAL/PLATELET  PROTIME-INR  HEPARIN LEVEL (UNFRACTIONATED)  TROPONIN I (HIGH SENSITIVITY)    ED ECG REPORT   Date: 03/16/2021  Rate: 62  Rhythm: normal sinus rhythm  QRS  Axis: right  Intervals: normal  ST/T Wave abnormalities: normal  Conduction Disutrbances:right bundle branch block and left anterior fascicular block  Narrative Interpretation:  Old EKG Reviewed: none available  I have personally reviewed the EKG tracing and agree with the computerized printout as noted.  Radiology CT CHEST W CONTRAST  Addendum Date: 03/16/2021   ADDENDUM REPORT: 03/16/2021 11:13 ADDENDUM: These results were called by telephone at the time of interpretation on 03/16/2021 at 11:12 am to provider Koren Shiver (RN) , who verbally acknowledged these results. Dr. Rush Landmark was not availabile. Mrs Gerarda Fraction stated she would relay findings to the "doctor of the day". Electronically Signed   By: Zetta Bills M.D.   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 EXAM: CT CHEST WITH CONTRAST TECHNIQUE: Multidetector CT imaging of the chest was performed during intravenous contrast administration. CONTRAST:  28mL OMNIPAQUE IOHEXOL 300 MG/ML  SOLN COMPARISON:  Abdominal CT from February of 2022 FINDINGS: Cardiovascular: Calcified and noncalcified atheromatous plaque in the thoracic aorta. Normal heart size. No pericardial effusion. The three-vessel coronary artery disease. Central pulmonary vessels are normal caliber but there is evidence of nonocclusive RIGHT lower embolus extending into segmental branches of the RIGHT lower lobe. Borderline are V to LV ratio. Mild straightening of the intraventricular septum. Mediastinum/Nodes: Thoracic inlet structures are normal. No axillary or thoracic inlet lymphadenopathy. No subpectoral adenopathy. No mediastinal adenopathy. Lungs/Pleura: RIGHT lower lobe nodule (image 112, series 4) 1.2 cm previously 1.4 cm though motion limited measurement assessment on the previous study.  Scarring in the RIGHT middle lobe. No effusion. Nodular area at the LEFT lung base with bandlike changes that extend from this area on the current study and on the prior study. This measures approximately 1.4 x 1.2 cm, previously 2.1 x 1.9 cm. Other scattered subpleural nodules in the 4-5 mm range. The mild bronchiectasis in the lingula. Airways are patent. Upper Abdomen: Persistent marked biliary duct distension. Pancreas not imaged. The spleen unremarkable. Adrenal glands are normal. Limited imaging of gastrointestinal tract is unremarkable. Musculoskeletal: No acute musculoskeletal process. Spinal degenerative changes. No destructive bone finding. Spinal nerve stimulator in place. IMPRESSION: 1. Study is positive for nonocclusive RIGHT lower lobe pulmonary embolus extending into segmental branches of the RIGHT lower lobe. Borderline are V to LV ratio and mild straightening of the intraventricular septum. Correlate with any clinical signs of right heart strain. May consider echocardiography for further assessment. 2. Nodular area at the LEFT lung base with bandlike changes that extend from this area on the current study and on the prior study. This measures approximately 1.4 x 1.2 cm, previously 2.1 x 1.9 cm. This may represent a combination of scarring and atelectasis. This may represent a post inflammatory process given decrease in size. Consider short interval follow-up for further evaluation. 3. Suggestion of slight interval decrease in size as well of a RIGHT lower lobe process with nodular features. Respiratory motion on the prior study did limit assessment. Short interval follow-up may be helpful. Could consider PET imaging if there is high risk for metastatic disease. 4. Persistent marked biliary duct distension. 5. Aortic atherosclerosis. A call is out to the referring provider to further discuss findings in the above case. Aortic Atherosclerosis (ICD10-I70.0). Electronically Signed: By: Zetta Bills  M.D. On: 03/16/2021 11:01   CT PELVIS W CONTRAST  Result Date: 03/16/2021 CLINICAL DATA:  Esophageal cancer. EXAM: CT PELVIS WITH CONTRAST TECHNIQUE: Multidetector CT imaging of the pelvis was performed using the standard  protocol following the bolus administration of intravenous contrast. CONTRAST:  75mL OMNIPAQUE IOHEXOL 300 MG/ML  SOLN COMPARISON:  Abdominal CT of the same date. FINDINGS: Urinary Tract: Urinary bladder descends to the pelvic floor. No gross abnormality aside from findings that are suggestive of pelvic floor dysfunction. No distal ureteral dilation. Bowel: Colonic diverticulosis. Diverticular disease of the sigmoid. Abundant stool throughout the colon, visualized portions of the colon as seen on recent abdominal CT. Colon incompletely imaged. Mild small bowel distension over the RIGHT iliac crest. Tethering of the small bowel in this area is suggested. This does not appear to cause complete obstruction based on the pattern of ingested contrast media though distal small bowel loops are potentially collapsed. Bowel distension up to 2.7 cm in the RIGHT lower quadrant with collapse of bowel loops, relative decompression of bowel loops in central abdomen and RIGHT lower pelvis. These may even be proximal to the level of dilation it is difficult to determine on the current evaluation. Vascular/Lymphatic: Calcified atheromatous plaque of the abdominal aorta distally passing into the iliac vessels. Reproductive:  Post hysterectomy. Other:  No ascites.  Small fat containing umbilical hernia. Musculoskeletal: Spinal degenerative changes and evidence of L5 laminectomy. LEFT hip arthroplasty with resultant streak artifact in the pelvis. RIGHT lower quadrant power pack for spinal nerve stimulation overlies the area immediately adjacent to bowel transition, streak artifact does limit assessment for subtle TS in this location. No acute bone finding.  No destructive bone process. IMPRESSION: 1. Mild small  bowel distension over the RIGHT iliac crest. Tethering of the small bowel in this area is suggested perhaps due to adhesions. Partial small bowel obstruction is considered based on pattern, correlate with any abdominal symptoms. 2. Abundant stool throughout the colon, visualized portions of the colon as seen on recent abdominal CT. 3. Colonic diverticulosis without evidence of acute diverticulitis. Signs of pelvic floor dysfunction 4. Small fat containing umbilical hernia. 5. Aortic atherosclerosis. These results were called by telephone at the time of interpretation on 03/16/2021 at 11:12 am to provider Koren Shiver (RN) , who verbally acknowledged these results. Dr. Rush Landmark was not availabile. Mrs Gerarda Fraction stated she would relay findings to the "doctor of the day". Aortic Atherosclerosis (ICD10-I70.0). Electronically Signed   By: Zetta Bills M.D.   On: 03/16/2021 11:15    Procedures Procedures   Medications Ordered in ED Medications  heparin ADULT infusion 100 units/mL (25000 units/239mL) (1,050 Units/hr Intravenous New Bag/Given 03/16/21 1600)  heparin bolus via infusion 4,000 Units (4,000 Units Intravenous Bolus from Bag 03/16/21 1603)    ED Course  I have reviewed the triage vital signs and the nursing notes.  Pertinent labs & imaging results that were available during my care of the patient were reviewed by me and considered in my medical decision making (see chart for details).  Patient seen and examined. Work-up initiated. Medications ordered.   Vital signs reviewed and are as follows: BP (!) 185/61   Pulse (!) 55   Temp 98.3 F (36.8 C) (Oral)   Resp 14   Ht 5\' 3"  (1.6 m)   Wt 72.5 kg   SpO2 98%   BMI 28.31 kg/m   PESI score, high risk, 4-11.4% 30 day mortality.   Discussed patient with Dr. Tomi Bamberger. Will start on heparin. EKG reviewed.   4:14 PM Will discuss with hospitalist.   4:57 PM Spoke with Dr. Grandville Silos who will admit.     MDM Rules/Calculators/A&P  Admit.     Final Clinical Impression(s) / ED Diagnoses Final diagnoses:  Acute pulmonary embolism with acute cor pulmonale, unspecified pulmonary embolism type [facility])    Rx / DC Orders ED Discharge Orders    None       Carlisle Cater, PA-C 03/16/21 1942    Dorie Rank, MD 03/16/21 2245

## 2021-03-16 NOTE — ED Triage Notes (Signed)
She had CT of her chest and abdomen yesterday. Results showed possible PE and possible bowel obstruction. He was recently diagnosed with esophageal cancer.

## 2021-03-16 NOTE — Progress Notes (Signed)
ANTICOAGULATION CONSULT NOTE - Initial Consult  Pharmacy Consult for heparin Indication: pulmonary embolus  Allergies  Allergen Reactions  . Gramicidin Other (See Comments)    unknown  . Sulfa Antibiotics Rash  . Wound Dressing Adhesive Rash    Patient Measurements: Height: 5\' 3"  (160 cm) Weight: 72.5 kg (159 lb 13.3 oz) IBW/kg (Calculated) : 52.4 Heparin Dosing Weight: 68 kg  Vital Signs: Temp: 98.3 F (36.8 C) (04/01 1441) Temp Source: Oral (04/01 1441) BP: 185/61 (04/01 1548) Pulse Rate: 55 (04/01 1549)  Labs: Recent Labs    03/15/21 1621 03/16/21 1455  HGB  --  12.9  HCT  --  39.8  PLT  --  216  LABPROT  --  12.2  INR  --  0.9  CREATININE 0.80 0.96  TROPONINIHS  --  11    Estimated Creatinine Clearance: 36.4 mL/min (by C-G formula based on SCr of 0.96 mg/dL).   Medical History: Past Medical History:  Diagnosis Date  . Arthritis   . Chicken pox   . Diabetes mellitus without complication ([place])   . Diverticulitis   . Esophageal cancer ([place]) dx'd 2020  . GERD (gastroesophageal reflux disease)   . Hyperlipidemia   . Hypertension   . OSA (obstructive sleep apnea)   . Urine incontinence   . Uterine cancer ([facility])     Medications:  Scheduled:  . heparin  4,000 Units Intravenous Once    Assessment:  Patient with PE, with recent diagnosis of esophageal cancer  Goal of Therapy:  Heparin level 0.3-0.7 units/ml Monitor platelets by anticoagulation protocol: Yes   Plan:  Give 4000 units bolus x 1 Start heparin infusion at 1050 units/hr Check anti-Xa level in 8 hours and daily while on heparin Continue to monitor H&H and platelets  Mallie Mussel A Sheala Dosh 03/16/2021,3:55 PM

## 2021-03-16 NOTE — Telephone Encounter (Addendum)
Unable to reach the patient by phone. I spoke with her daughter, an Therapist, sports, who spoke with her by phone earlier today. Reviewed CT results reviewing each item in the findings. I recommended hospitalization to consider anticoagulation and treated suspected partial SBO. However, her daughter feels like she has stable symptoms that are unchanged over several months. Noted a change in bowel habits last year after she started using morphine for back pain, and a change in stool color with the use of Creon, but, denies any acute changes. There is no specific abdominal pain.  No shortness of breath or chest pain. She is actively unpacking and moving boxes today without symptoms. Her daughter does not feel that hospitalization necessary.  She prefers to wait for office visit with Oncology 03/19/21.  Given my concerns for untreated diagnoses that may progress with significant morbidity and mortality, I reviewed her care with Dr. Carollee Herter by phone. She is going to reach out to the daughter in another effort to encourage care her to seek today.

## 2021-03-16 NOTE — ED Notes (Signed)
Placed on 2L O2 via [place] at this time.  O2 sats noted to drop to 89% while sleeping.  Per daughter uses bipap at night.

## 2021-03-16 NOTE — ED Notes (Signed)
Patient requesting to take home morphine 30mg  tablet.  Provider made aware.  Okay given to take medication.

## 2021-03-16 NOTE — Progress Notes (Signed)
RN reported to RT that PT has been placed on 2 LPM nasal cannula.

## 2021-03-16 NOTE — Telephone Encounter (Signed)
Dr Tarri Glenn I received a critical result for this pt's CT scan Chest Abd and Pelvis.  Possible SBO, and pulmonary embolus.  Also, question of heart strain per radiologist.  Dr Rush Landmark is off today can you please review as doc of the day.  Thank you

## 2021-03-16 NOTE — Telephone Encounter (Signed)
Follow-up from Dr. Carollee Herter after she spoke with the patient's daughter. She will go to [facility] ED today.

## 2021-03-16 NOTE — ED Notes (Signed)
Report given to [facility] from [place].

## 2021-03-16 NOTE — H&P (Signed)
History and Physical    Sherry Holloway OZH:[id number] DOB: April 06, 1928 DOA: 03/16/2021  PCP: Ann Held, DO  Patient coming from: [facility]  I have personally briefly reviewed patient's old medical records in [place]  Chief Complaint: Abnormal CT scan  HPI: Sherry Holloway is a 87 y.o. 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  Patient recently underwent GI evaluation for recent abnormal CT.  On 03/05/2021 she underwent upper EUS which showed a mid esophageal lesion which was biopsied.  Also underwent ERCP same day which showed severe CBD dilatation and intrahepatic biliary ductal dilatation.  Sphincterotomy was performed.  Pathology returned as invasive squamous cell carcinoma.  She underwent staging imaging with CT chest and pelvis with contrast.  CT chest results showed a nonocclusive right lower lobe PE extending into segmental branches of the right lower lobe.  CT pelvis showed changes suggesting possible partial small bowel obstruction perhaps due to adhesions.  Abundant stool throughout the colon was noted.  Her GI and primary care team were notified of results and patient was advised to come to the ED for further evaluation.  Patient states she is otherwise feeling fine.  She reports occasional constipation due her home morphine which has been managed with Colace.  She did have a formed bowel movement earlier this morning and is passing gas.  She denies any nausea, vomiting, abdominal pain.  She has not had any subjective fevers, chills, diaphoresis, dyspnea, cough, chest pain, or dysuria.  She has not seen any new swelling in her lower extremities.  She denies any obvious bleeding.   [facility] ED Course:  Initial vitals showed  BP 175/53, pulse 63, RR 18, temp 98.3 F, SPO2 95% on room air.  Labs show sodium 138, potassium 4.2, bicarb 28, BUN 18, creatinine 0.96, serum glucose 131, AST 15, ALT 25, alk phos 73, total bilirubin 0.1, WBC 7.6, hemoglobin 12.9, platelets 216,000, INR 0.9, high-sensitivity troponin I 11.  SARS-CoV-2 PCR is negative.  Influenza A/B PCR's are negative.  Bilateral lower extremity venous ultrasound negative for DVT.  Patient was started on IV heparin and the hospitalist service was consulted to admit for further evaluation and management.  Review of Systems: All systems reviewed and are negative except as documented in history of present illness above.   Past Medical History:  Diagnosis Date  . Arthritis   . Chicken pox   . Diabetes mellitus without complication ([place])   . Diverticulitis   . Esophageal cancer ([place]) dx'd 2020  . GERD (gastroesophageal reflux disease)   . Hyperlipidemia   . Hypertension   . OSA (obstructive sleep apnea)   . Urine incontinence   . Uterine cancer [facility])     Past Surgical History:  Procedure Laterality Date  . ABDOMINAL HYSTERECTOMY  1972  . APPENDECTOMY    . BILIARY DILATION  03/05/2021   Procedure: BILIARY DILATION;  Surgeon: Rush Landmark Telford Nab., MD;  Location: [facility];  Service: Gastroenterology;;  . BIOPSY  03/05/2021   Procedure: BIOPSY;  Surgeon: Irving Copas., MD;  Location: [facility];  Service: Gastroenterology;;  . Lorin Mercy    . ENDOSCOPIC RETROGRADE CHOLANGIOPANCREATOGRAPHY (ERCP) WITH PROPOFOL N/A 03/05/2021   Procedure: ENDOSCOPIC RETROGRADE CHOLANGIOPANCREATOGRAPHY (ERCP) WITH PROPOFOL;  Surgeon: Rush Landmark Telford Nab., MD;  Location: [facility];  Service: Gastroenterology;  Laterality: N/A;  . ESOPHAGOGASTRODUODENOSCOPY (EGD) WITH PROPOFOL N/A 03/05/2021   Procedure: ESOPHAGOGASTRODUODENOSCOPY (EGD) WITH PROPOFOL;  Surgeon: Rush Landmark Telford Nab., MD;  Location: [facility];  Service: Gastroenterology;   Laterality: N/A;  . EUS N/A 03/05/2021   Procedure: UPPER ENDOSCOPIC ULTRASOUND (EUS) RADIAL;  Surgeon: Rush Landmark Telford Nab., MD;  Location: [facility];  Service: Gastroenterology;  Laterality: N/A;  . LUMBAR LAMINECTOMY    . REMOVAL OF STONES  03/05/2021   Procedure: REMOVAL OF STONES;  Surgeon: Rush Landmark Telford Nab., MD;  Location: [facility];  Service: Gastroenterology;;  . SPINAL CORD STIMULATOR IMPLANT    . SPYGLASS CHOLANGIOSCOPY N/A 03/05/2021   Procedure: SPYGLASS CHOLANGIOSCOPY;  Surgeon: Irving Copas., MD;  Location: [facility];  Service: Gastroenterology;  Laterality: N/A;  Bess Kinds LITHOTRIPSY N/A 03/05/2021   Procedure: STMHDQQI LITHOTRIPSY;  Surgeon: Irving Copas., MD;  Location: [facility];  Service: Gastroenterology;  Laterality: N/A;  . TONSILLECTOMY    . TOTAL KNEE ARTHROPLASTY Right     Social History:  reports that she quit smoking about 32 years ago. Her smoking use included cigarettes. She has never used smokeless tobacco. She reports previous alcohol use. She reports that she does not use drugs.  Allergies  Allergen Reactions  . Gramicidin Other (See Comments)    unknown  . Sulfa Antibiotics Rash  . Wound Dressing Adhesive Rash    Family History  Problem Relation Age of Onset  . Diabetes Mother   . Heart disease Father   . Lung cancer Sister   . Lung cancer Son   . Hypertension Son   . Hypertension Daughter   . Diabetes Son   . Hypertension Son   . Diabetes Daughter   . Hypertension Daughter      Prior to Admission medications   Medication Sig Start Date End Date Taking? Authorizing Provider  aspirin EC 81 MG tablet Take 81 mg by mouth daily. Swallow whole.   Yes [provider]  bumetanide (BUMEX) 1 MG tablet Take 1 tablet (1 mg total) by mouth daily. Take 1/2 to 1 tablet daily for swelling Patient taking differently: Take 0.5 mg by mouth daily. for swelling 11/28/20  Yes Roma Schanz R, DO  Docusate  Sodium (COLACE PO) Take 100 mg by mouth daily.   Yes [provider]  famciclovir (FAMVIR) 125 MG tablet Take 1 tablet (125 mg total) by mouth daily. 12/25/20  Yes Roma Schanz R, DO  glimepiride (AMARYL) 2 MG tablet Take 2 mg by mouth daily with supper.   Yes [provider]  glimepiride (AMARYL) 4 MG tablet Take 1 tablet (4 mg total) by mouth daily before breakfast. 10/23/20  Yes Ann Held, DO  Ibuprofen (MOTRIN PO) Take 200-400 mg by mouth daily as needed (Pain).   Yes [provider]  latanoprost (XALATAN) 0.005 % ophthalmic solution Place 1 drop into both eyes at bedtime. 09/18/20  Yes [provider]  lipase/protease/amylase (CREON) 36000 UNITS CPEP capsule Take 1 capsule (36,000 Units total) by mouth 3 (three) times daily before meals. Patient taking differently: Take 36,000 Units by mouth 2 (two) times daily. 11/28/20  Yes Roma Schanz R, DO  mirtazapine (REMERON) 15 MG tablet Take 1 tablet (15 mg total) by mouth at bedtime. Patient taking differently: Take 15 mg by mouth daily with supper. 10/23/20  Yes Roma Schanz R, DO  morphine (MS CONTIN) 30 MG 12 hr tablet Take 30 mg by mouth every 12 (  twelve) hours.   Yes [provider]  Multiple Vitamins-Minerals (WOMENS 50+ MULTI VITAMIN/MIN PO) Take 1 tablet by mouth daily with supper.   Yes [provider]  omeprazole (PRILOSEC) 20 MG capsule Take 1 capsule (20 mg total) by mouth 2 (two) times daily before a meal. 10/19/20  Yes Lowne [name], Yvonne R, DO  valsartan (DIOVAN) 40 MG tablet Take 20 mg by mouth daily with breakfast.   Yes [provider]  acetaminophen (TYLENOL) 325 MG tablet Take 352-650 mg by mouth every 6 (six) hours as needed for mild pain or moderate pain.    [provider]  fluorouracil (EFUDEX) 5 % cream Apply topically 2 (two) times daily. Patient taking differently: Apply 1 application topically daily. On foot 01/09/21   Hyatt,  Max T, DPM  glucose blood test strip Check blood sugars twice daily 01/19/21   Ann Held, DO    Physical Exam: Vitals:   03/16/21 1800 03/16/21 2000 03/16/21 2158 03/16/21 2200  BP: (!) 156/49 (!) 171/49 (!) 177/49   Pulse: (!) 56 (!) 57 61   Resp: _0 Temp:   98 F (36.7 C)   TempSrc:   Oral   SpO2: 100% 100% 99%   Weight:    73.2 kg  Height:    _1  (1.626 m)   Constitutional: Resting supine in bed, NAD, calm, comfortable Eyes: PERRL, lids and conjunctivae normal ENMT: Mucous membranes are moist. Posterior pharynx clear of any exudate or lesions.Normal dentition.  Neck: normal, supple, no masses. Respiratory: Bibasilar inspiratory crackles.  Normal respiratory effort. No accessory muscle use.  Cardiovascular: Regular rate and rhythm, no murmurs / rubs / gallops. No extremity edema. 2+ pedal pulses. Abdomen: no tenderness, no masses palpated. Bowel sounds positive.  Musculoskeletal: no clubbing / cyanosis. No joint deformity upper and lower extremities. Good ROM, no contractures. Normal muscle tone.  S/p right knee replacement.  Has nontender swollen area medial right knee which she says is chronic and has been there the last year. Skin: no rashes, lesions, ulcers. No induration Neurologic: CN 2-12 grossly intact. Sensation intact. Strength 5/5 in all 4.  Psychiatric: Normal judgment and insight. Alert and oriented x 3. Normal mood.   Labs on Admission: I have personally reviewed following labs and imaging studies  CBC: Recent Labs  Lab 03/16/21 1455  WBC 7.6  NEUTROABS 4.2  HGB 12.9  HCT 39.8  MCV 84.1  PLT 532   Basic Metabolic Panel: Recent Labs  Lab 03/15/21 1621 03/16/21 1455  NA  --  138  K  --  4.2  CL  --  102  CO2  --  28  GLUCOSE  --  131*  BUN  --  18  CREATININE 0.80 0.96  CALCIUM  --  9.5   GFR: Estimated Creatinine Clearance: 37.4 mL/min (by C-G formula based on SCr of 0.96 mg/dL). Liver Function Tests: Recent Labs  Lab  03/16/21 1455  AST 15  ALT 25  ALKPHOS 73  BILITOT 0.1*  PROT 7.2  ALBUMIN 3.9   No results for input(s): LIPASE, AMYLASE in the last 168 hours. No results for input(s): AMMONIA in the last 168 hours. Coagulation Profile: Recent Labs  Lab 03/16/21 1455  INR 0.9   Cardiac Enzymes: No results for input(s): CKTOTAL, CKMB, CKMBINDEX, TROPONINI in the last 168 hours. BNP (last 3 results) No results for input(s): PROBNP in the last 8760 hours. HbA1C: No results for input(s): HGBA1C in  the last 72 hours. CBG: No results for input(s): GLUCAP in the last 168 hours. Lipid Profile: No results for input(s): CHOL, HDL, LDLCALC, TRIG, CHOLHDL, LDLDIRECT in the last 72 hours. Thyroid Function Tests: No results for input(s): TSH, T4TOTAL, FREET4, T3FREE, THYROIDAB in the last 72 hours. Anemia Panel: No results for input(s): VITAMINB12, FOLATE, FERRITIN, TIBC, IRON, RETICCTPCT in the last 72 hours. Urine analysis:    Component Value Date/Time   BILIRUBINUR negative 10/19/2020 1440   PROTEINUR Negative 10/19/2020 1440   UROBILINOGEN 0.2 10/19/2020 1440   NITRITE negative 10/19/2020 1440   LEUKOCYTESUR Negative 10/19/2020 1440    Radiological Exams on Admission: CT CHEST W CONTRAST  Addendum Date: 03/16/2021   ADDENDUM REPORT: 03/16/2021 11:13 ADDENDUM: These results were called by telephone at the time of interpretation on 03/16/2021 at 11:12 am to provider Koren Shiver (RN) , who verbally acknowledged these results. Dr. Rush Landmark was not availabile. Sherry Holloway stated she would relay findings to the "doctor of the day". Electronically Signed   By: Zetta Bills M.D.   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 EXAM: CT CHEST WITH CONTRAST TECHNIQUE: Multidetector CT imaging of the chest was performed  during intravenous contrast administration. CONTRAST:  35m OMNIPAQUE IOHEXOL 300 MG/ML  SOLN COMPARISON:  Abdominal CT from February of 2022 FINDINGS: Cardiovascular: Calcified and noncalcified atheromatous plaque in the thoracic aorta. Normal heart size. No pericardial effusion. The three-vessel coronary artery disease. Central pulmonary vessels are normal caliber but there is evidence of nonocclusive RIGHT lower embolus extending into segmental branches of the RIGHT lower lobe. Borderline are V to LV ratio. Mild straightening of the intraventricular septum. Mediastinum/Nodes: Thoracic inlet structures are normal. No axillary or thoracic inlet lymphadenopathy. No subpectoral adenopathy. No mediastinal adenopathy. Lungs/Pleura: RIGHT lower lobe nodule (image 112, series 4) 1.2 cm previously 1.4 cm though motion limited measurement assessment on the previous study. Scarring in the RIGHT middle lobe. No effusion. Nodular area at the LEFT lung base with bandlike changes that extend from this area on the current study and on the prior study. This measures approximately 1.4 x 1.2 cm, previously 2.1 x 1.9 cm. Other scattered subpleural nodules in the 4-5 mm range. The mild bronchiectasis in the lingula. Airways are patent. Upper Abdomen: Persistent marked biliary duct distension. Pancreas not imaged. The spleen unremarkable. Adrenal glands are normal. Limited imaging of gastrointestinal tract is unremarkable. Musculoskeletal: No acute musculoskeletal process. Spinal degenerative changes. No destructive bone finding. Spinal nerve stimulator in place. IMPRESSION: 1. Study is positive for nonocclusive RIGHT lower lobe pulmonary embolus extending into segmental branches of the RIGHT lower lobe. Borderline are V to LV ratio and mild straightening of the intraventricular septum. Correlate with any clinical signs of right heart strain. May consider echocardiography for further assessment. 2. Nodular area at the LEFT lung base  with bandlike changes that extend from this area on the current study and on the prior study. This measures approximately 1.4 x 1.2 cm, previously 2.1 x 1.9 cm. This may represent a combination of scarring and atelectasis. This may represent a post inflammatory process given decrease in size. Consider short interval follow-up for further evaluation. 3. Suggestion of slight interval decrease in size as well of a RIGHT lower lobe process with nodular features. Respiratory motion on the prior study did limit assessment.  Short interval follow-up may be helpful. Could consider PET imaging if there is high risk for metastatic disease. 4. Persistent marked biliary duct distension. 5. Aortic atherosclerosis. A call is out to the referring provider to further discuss findings in the above case. Aortic Atherosclerosis (ICD10-I70.0). Electronically Signed: By: Zetta Bills M.D. On: 03/16/2021 11:01   CT PELVIS W CONTRAST  Result Date: 03/16/2021 CLINICAL DATA:  Esophageal cancer. EXAM: CT PELVIS WITH CONTRAST TECHNIQUE: Multidetector CT imaging of the pelvis was performed using the standard protocol following the bolus administration of intravenous contrast. CONTRAST:  60m OMNIPAQUE IOHEXOL 300 MG/ML  SOLN COMPARISON:  Abdominal CT of the same date. FINDINGS: Urinary Tract: Urinary bladder descends to the pelvic floor. No gross abnormality aside from findings that are suggestive of pelvic floor dysfunction. No distal ureteral dilation. Bowel: Colonic diverticulosis. Diverticular disease of the sigmoid. Abundant stool throughout the colon, visualized portions of the colon as seen on recent abdominal CT. Colon incompletely imaged. Mild small bowel distension over the RIGHT iliac crest. Tethering of the small bowel in this area is suggested. This does not appear to cause complete obstruction based on the pattern of ingested contrast media though distal small bowel loops are potentially collapsed. Bowel distension up to 2.7  cm in the RIGHT lower quadrant with collapse of bowel loops, relative decompression of bowel loops in central abdomen and RIGHT lower pelvis. These may even be proximal to the level of dilation it is difficult to determine on the current evaluation. Vascular/Lymphatic: Calcified atheromatous plaque of the abdominal aorta distally passing into the iliac vessels. Reproductive:  Post hysterectomy. Other:  No ascites.  Small fat containing umbilical hernia. Musculoskeletal: Spinal degenerative changes and evidence of L5 laminectomy. LEFT hip arthroplasty with resultant streak artifact in the pelvis. RIGHT lower quadrant power pack for spinal nerve stimulation overlies the area immediately adjacent to bowel transition, streak artifact does limit assessment for subtle TS in this location. No acute bone finding.  No destructive bone process. IMPRESSION: 1. Mild small bowel distension over the RIGHT iliac crest. Tethering of the small bowel in this area is suggested perhaps due to adhesions. Partial small bowel obstruction is considered based on pattern, correlate with any abdominal symptoms. 2. Abundant stool throughout the colon, visualized portions of the colon as seen on recent abdominal CT. 3. Colonic diverticulosis without evidence of acute diverticulitis. Signs of pelvic floor dysfunction 4. Small fat containing umbilical hernia. 5. Aortic atherosclerosis. These results were called by telephone at the time of interpretation on 03/16/2021 at 11:12 am to provider PKoren Shiver(RN) , who verbally acknowledged these results. Dr. MRush Landmarkwas not availabile. Sherry PGerarda Fractionstated she would relay findings to the "doctor of the day". Aortic Atherosclerosis (ICD10-I70.0). Electronically Signed   By: GZetta BillsM.D.   On: 03/16/2021 11:15   UKoreaVenous Img Lower Bilateral  Result Date: 03/16/2021 CLINICAL DATA:  Recent PE, lower extremity swelling EXAM: BILATERAL LOWER EXTREMITY VENOUS DOPPLER ULTRASOUND TECHNIQUE:  Gray-scale sonography with graded compression, as well as color Doppler and duplex ultrasound were performed to evaluate the lower extremity deep venous systems from the level of the common femoral vein and including the common femoral, femoral, profunda femoral, popliteal and calf veins including the posterior tibial, peroneal and gastrocnemius veins when visible. The superficial great saphenous vein was also interrogated. Spectral Doppler was utilized to evaluate flow at rest and with distal augmentation maneuvers in the common femoral, femoral and popliteal veins. COMPARISON:  None. FINDINGS: RIGHT LOWER EXTREMITY  Common Femoral Vein: No evidence of thrombus. Normal compressibility, respiratory phasicity and response to augmentation. Saphenofemoral Junction: No evidence of thrombus. Normal compressibility and flow on color Doppler imaging. Profunda Femoral Vein: No evidence of thrombus. Normal compressibility and flow on color Doppler imaging. Femoral Vein: No evidence of thrombus. Normal compressibility, respiratory phasicity and response to augmentation. Popliteal Vein: No evidence of thrombus. Normal compressibility, respiratory phasicity and response to augmentation. Calf Veins: Limited assessment of the calf veins. Tibial veins appear patent. Peroneal veins not visualized. No gross thrombus appreciated. Superficial Great Saphenous Vein: No evidence of thrombus. Normal compressibility. Other Findings:  Peripheral subcutaneous edema noted. LEFT LOWER EXTREMITY Common Femoral Vein: No evidence of thrombus. Normal compressibility, respiratory phasicity and response to augmentation. Saphenofemoral Junction: No evidence of thrombus. Normal compressibility and flow on color Doppler imaging. Profunda Femoral Vein: No evidence of thrombus. Normal compressibility and flow on color Doppler imaging. Femoral Vein: No evidence of thrombus. Normal compressibility, respiratory phasicity and response to augmentation.  Popliteal Vein: No evidence of thrombus. Normal compressibility, respiratory phasicity and response to augmentation. Calf Veins: Limited assessment of the tibial and peroneal calf veins. No gross thrombus appreciated. Superficial Great Saphenous Vein: No evidence of thrombus. Normal compressibility. Other Findings: Medial popliteal fossa complex thick-walled Baker's cyst measures 5.7 x 1.3 x 2.1 cm IMPRESSION: No significant femoropopliteal DVT. Limited assessment of the calf veins. Peripheral edema noted. 5.7 cm left popliteal fossa Baker's cyst. Electronically Signed   By: Jerilynn Mages.  Shick M.D.   On: 03/16/2021 17:19    EKG: Personally reviewed. Sinus rhythm, rate 62, RBBB and LAFB.  No prior for comparison.  Assessment/Plan Principal Problem:   Pulmonary embolus (HCC) Active Problems:   Hypertension   Diabetes mellitus without complication ([place])   Type 2 diabetes mellitus with hyperglycemia, without long-term current use of insulin (HCC)   Sherry Holloway is a 87 y.o. 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  Right lower lobe PE: Nonocclusive right lower lobe PE seen on outpatient staging CT imaging.  She was started on IV heparin. -Continue IV heparin, can likely transition to oral DOAC tomorrow -Obtain echocardiogram  Questionable mild partial SBO: Seen on outpatient CT imaging.  Patient is asymptomatic without nausea, vomiting, or abdominal pain.  She did actually eat a meal while waiting in the ED for transfer here which she tolerated well.  -Continue with soft diet as tolerated -Antiemetics as needed -If develops any symptoms would make strict n.p.o.  Invasive squamous cell carcinoma of the esophagus: Recently diagnosed and following with GI as an outpatient.  Type 2 diabetes: Holding home Amaryl, placed on sensitive SSI.  Hypertension: Continue  valsartan.  Chronic back pain: Continue home morphine with Colace.  DVT prophylaxis: Heparin drip Code Status: Full code, confirmed with patient Family Communication: Discussed with patient, she has discussed with family Disposition Plan: From home and likely discharge to home in 1-2 days Consults called: None Level of care: Progressive Admission status:  Status is: Inpatient  Remains inpatient appropriate because:Ongoing diagnostic testing needed not appropriate for outpatient work up   Dispo: The patient is from: Home              Anticipated d/c is to: Home              Patient currently is not medically stable to d/c.   Difficult to place patient No  Zada Finders MD [facility]  If 7PM-7AM, please contact night-coverage [email or web address]  03/16/2021, 10:50 PM

## 2021-03-16 NOTE — Care Plan (Signed)
Was called by Carlisle Cater, PA ED at [facility] for admission for patient, Sherry Holloway. Patient is a active 87 year old female recently diagnosed invasive esophageal cancer per upper endoscopy 1 to 2 weeks ago and referred to oncology.  In preparation to see oncology staging scans of chest and abdomen were done per GI and results positive for PE and concern for possible partial small bowel obstruction.  Patient asymptomatic and sent to the ED for admission and further management.  Patient noted in the ED to have stable vital signs with blood pressure 175/53, pulse of 55, respiratory rate 15, sats of 100% on room air.  Comprehensive metabolic profile unremarkable.  Troponin at 11.  CBC unremarkable.   Also noted on CT abdomen and pelvis with concern for partial small bowel obstruction however per ED PA patient with no clinical signs or symptoms of obstruction as patient noted to have bowel movement the morning of admission, no abdominal distention, no abdominal pain. Per EDPA CT chest done 1 day prior to admission with concerns of for borderline VE to LV ratio and mild straightening of intraventricular septum with concerns for right heart strain with consideration for 2D echo for further assessment and as such requesting admission.  Patient noted to have a high risk  PESI score with 11.4% 30-day mortality.  Patient started on IV heparin  Patient accepted to progressive care floor.     No charge.

## 2021-03-16 NOTE — ED Notes (Signed)
Josh, PA, made aware that patient BP has risen in the last hour. Currently 191/50's. Awaiting orders.

## 2021-03-17 ENCOUNTER — Inpatient Hospital Stay (HOSPITAL_COMMUNITY): Payer: Medicare Other

## 2021-03-17 DIAGNOSIS — I2602 Saddle embolus of pulmonary artery with acute cor pulmonale: Secondary | ICD-10-CM | POA: Diagnosis not present

## 2021-03-17 DIAGNOSIS — I10 Essential (primary) hypertension: Secondary | ICD-10-CM

## 2021-03-17 DIAGNOSIS — E119 Type 2 diabetes mellitus without complications: Secondary | ICD-10-CM

## 2021-03-17 DIAGNOSIS — I2699 Other pulmonary embolism without acute cor pulmonale: Principal | ICD-10-CM

## 2021-03-17 LAB — GLUCOSE, CAPILLARY
Glucose-Capillary: 125 mg/dL — ABNORMAL HIGH (ref 70–99)
Glucose-Capillary: 246 mg/dL — ABNORMAL HIGH (ref 70–99)
Glucose-Capillary: 135 mg/dL — ABNORMAL HIGH (ref 70–99)
Glucose-Capillary: 208 mg/dL — ABNORMAL HIGH (ref 70–99)

## 2021-03-17 LAB — ECHOCARDIOGRAM COMPLETE
Area-P 1/2: 2.29 cm2
Height: 64 in
S' Lateral: 2.7 cm
Weight: 2582.4 oz

## 2021-03-17 LAB — BASIC METABOLIC PANEL
Anion gap: 6 (ref 5–15)
BUN: 16 mg/dL (ref 8–23)
CO2: 25 mmol/L (ref 22–32)
Calcium: 8.9 mg/dL (ref 8.9–10.3)
Chloride: 106 mmol/L (ref 98–111)
Creatinine, Ser: 0.88 mg/dL (ref 0.44–1.00)
GFR, Estimated: >60 mL/min (ref >60)
Glucose, Bld: 180 mg/dL — ABNORMAL HIGH (ref 70–99)
Potassium: 4.1 mmol/L (ref 3.5–5.1)
Sodium: 137 mmol/L (ref 135–145)

## 2021-03-17 LAB — CBC
HCT: 35.4 % — ABNORMAL LOW (ref 36.0–46.0)
Hemoglobin: 10.9 g/dL — ABNORMAL LOW (ref 12.0–15.0)
MCH: 26.8 pg (ref 26.0–34.0)
MCHC: 30.8 g/dL (ref 30.0–36.0)
MCV: 87.2 fL (ref 80.0–100.0)
Platelets: 174 10*3/uL (ref 150–400)
RBC: 4.06 MIL/uL (ref 3.87–5.11)
RDW: 14.8 % (ref 11.5–15.5)
WBC: 7.3 10*3/uL (ref 4.0–10.5)
nRBC: 0 % (ref 0.0–0.2)

## 2021-03-17 LAB — HEPARIN LEVEL (UNFRACTIONATED)
Heparin Unfractionated: 0.22 IU/mL — ABNORMAL LOW (ref 0.30–0.70)
Heparin Unfractionated: 0.37 IU/mL (ref 0.30–0.70)

## 2021-03-17 MED ORDER — RIVAROXABAN 20 MG PO TABS: 20.0000 mg | ORAL_TABLET | Freq: Every day | ORAL | Status: DC

## 2021-03-17 MED ORDER — DOCUSATE SODIUM 100 MG PO CAPS
100.0000 mg | ORAL_CAPSULE | Freq: Two times a day (BID) | ORAL | Status: DC
  Administered 2021-03-17 – 2021-03-18 (×3): 100 mg via ORAL
  Filled 2021-03-17 (×3): qty 1

## 2021-03-17 MED ORDER — MIRTAZAPINE 15 MG PO TABS
15.0000 mg | ORAL_TABLET | Freq: Every day | ORAL | Status: DC
  Administered 2021-03-17: 15 mg via ORAL
  Filled 2021-03-17: qty 1

## 2021-03-17 MED ORDER — LATANOPROST 0.005 % OP SOLN
1.0000 [drp] | Freq: Every day | OPHTHALMIC | Status: DC
  Administered 2021-03-17: 1 [drp] via OPHTHALMIC
  Filled 2021-03-17: qty 2.5

## 2021-03-17 MED ORDER — HEPARIN BOLUS VIA INFUSION
1000.0000 [IU] | Freq: Once | INTRAVENOUS | Status: AC
  Administered 2021-03-17: 1000 [IU] via INTRAVENOUS
  Filled 2021-03-17: qty 1000

## 2021-03-17 MED ORDER — POLYETHYLENE GLYCOL 3350 17 G PO PACK
17.0000 g | PACK | Freq: Every day | ORAL | Status: DC
  Administered 2021-03-17: 17 g via ORAL
  Filled 2021-03-17: qty 1

## 2021-03-17 MED ORDER — POLYETHYLENE GLYCOL 3350 17 G PO PACK
17.0000 g | PACK | Freq: Two times a day (BID) | ORAL | Status: DC
  Administered 2021-03-17 – 2021-03-18 (×2): 17 g via ORAL
  Filled 2021-03-17 (×2): qty 1

## 2021-03-17 MED ORDER — RIVAROXABAN 15 MG PO TABS
15.0000 mg | ORAL_TABLET | Freq: Two times a day (BID) | ORAL | Status: DC
  Administered 2021-03-17 – 2021-03-18 (×3): 15 mg via ORAL
  Filled 2021-03-17 (×3): qty 1

## 2021-03-17 NOTE — Plan of Care (Signed)

## 2021-03-17 NOTE — Progress Notes (Signed)
Pt ambulated on the unit with daughter, using walker, walked around the unit corridor and tolerated well. Pharmacist spoke with dtg and pt concerning medication Xarelto. Reviewed education again with pt about Xarelto. Pt took initial dose of med. SRP, RN

## 2021-03-17 NOTE — Progress Notes (Signed)
[facility] PROGRESS NOTE   Sherry Holloway ZJI:[id number] DOB: [birth year] DOA: 03/16/2021  PCP: Ann Held, DO  Brief History/Interval Summary: 87 y.o. 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  Patient was hospitalized for further management.  Consultants: None  Procedures: Transthoracic echocardiogram is pending  Antibiotics: Anti-infectives (From admission, onward)   None      Subjective/Interval History: Patient states that she is feeling well this morning.  Denies any chest pain or shortness of breath.  No nausea or vomiting.  Has been passing gas from below.  Has not had a bowel movement yet.    Assessment/Plan:  Acute pulmonary embolism in the right lower lobe of the lung Recently diagnosed with esophageal cancer.  Likely hypercoagulable due to her malignancy.  Patient was started on IV heparin.  Dynamically stable this morning.  Slight drop in hemoglobin noted but no overt bleeding noted.  Could be dilutional drop.  Echocardiogram is pending.  Lower extremity Doppler studies were negative for DVT.  Change her over to rivaroxaban today.  Follow-up on echocardiogram.  Mobilize later today.  Questionable mild partial small bowel obstruction This was incidentally noted on CT scan.  A lot of stool burden was noted.  Patient does not have any abdominal pain or any any other GI symptoms currently.  She has been passing gas.  We will place her on laxatives.  Will check TSH.  Mobilize.  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  Supposed to see Dr. Benay Spice on Monday 4/4 for initial visit with oncology.  Diabetes mellitus type 2 Continue SSI.   Monitor CBGs.  Essential hypertension Valsartan being continued.  Chronic back pain Noted to be on MS Contin at home which is being continued.  She will need a good bowel regimen.  Incidental lung findings on CT scan Nodular area at the LEFT lung base with bandlike changes that extend from this area on the current study and on the prior study. This measures approximately 1.4 x 1.2 cm, previously 2.1 x 1.9 cm. This may represent a combination of scarring and atelectasis. This may represent a post inflammatory process given decrease in size.  Will need outpatient monitoring.  DVT Prophylaxis: On full therapeutic anticoagulation Code Status: Full code Family Communication: Discussed with the patient.  No family at bedside Disposition Plan: Anticipate discharge tomorrow.  Hopefully return home then.  Status is: Inpatient  Remains inpatient appropriate because:IV treatments appropriate due to intensity of illness or inability to take PO and Inpatient level of care appropriate due to severity of illness   Dispo: The patient is from: Home              Anticipated d/c is to: Home              Patient currently is not medically stable to d/c.   Difficult to place patient No      Medications:  Scheduled: . docusate sodium  100 mg Oral BID  . insulin aspart  0-9 Units Subcutaneous TID WC  . irbesartan  37.5 mg Oral Daily  . lipase/protease/amylase  36,000 Units Oral BID  . morphine  30 mg Oral Q12H  . polyethylene glycol  17 g Oral Daily   Continuous: .  heparin 1,250 Units/hr (03/17/21 0604)   INO:[id number] **OR** acetaminophen, ondansetron **OR** ondansetron (ZOFRAN) IV   Objective:  Vital Signs  Vitals:   03/16/21 2158 03/16/21 2200 03/17/21 0046 03/17/21 0446  BP: (!) 177/49  (!) 146/44 (!) 136/57  Pulse: 61  (!) 54 (!) 54  Resp: 20     Temp: 98 F (36.7 C)  98.5 F (36.9 C) 98.5 F (36.9 C)  TempSrc: Oral  Oral Oral  SpO2: 99%  95% 94%  Weight:  73.2 kg     Height:  5\' 4"  (1.626 m)      Intake/Output Summary (Last 24 hours) at 03/17/2021 0953 Last data filed at 03/17/2021 0604 Gross per 24 hour  Intake 383.73 ml  Output 200 ml  Net 183.73 ml   Filed Weights   03/16/21 1436 03/16/21 2200  Weight: 72.5 kg 73.2 kg    General appearance: Awake alert.  In no distress Resp: Clear to auscultation bilaterally.  Normal effort Cardio: S1-S2 is normal regular.  No S3-S4.  No rubs murmurs or bruit GI: Abdomen is soft.  Nontender nondistended.  Bowel sounds are present normal.  No masses organomegaly Extremities: No edema.  Full range of motion of lower extremities. Neurologic: Alert and oriented x3.  No focal neurological deficits.    Lab Results:  Data Reviewed: I have personally reviewed following labs and imaging studies  CBC: Recent Labs  Lab 03/16/21 1455 03/17/21 0001  WBC 7.6 7.3  NEUTROABS 4.2  --   HGB 12.9 10.9*  HCT 39.8 35.4*  MCV 84.1 87.2  PLT 216 836    Basic Metabolic Panel: Recent Labs  Lab 03/15/21 1621 03/16/21 1455 03/17/21 0001  NA  --  138 137  K  --  4.2 4.1  CL  --  102 106  CO2  --  28 25  GLUCOSE  --  131* 180*  BUN  --  18 16  CREATININE 0.80 0.96 0.88  CALCIUM  --  9.5 8.9    GFR: Estimated Creatinine Clearance: 40.8 mL/min (by C-G formula based on SCr of 0.88 mg/dL).  Liver Function Tests: Recent Labs  Lab 03/16/21 1455  AST 15  ALT 25  ALKPHOS 73  BILITOT 0.1*  PROT 7.2  ALBUMIN 3.9     Coagulation Profile: Recent Labs  Lab 03/16/21 1455  INR 0.9     CBG: Recent Labs  Lab 03/16/21 2331 03/17/21 0725  GLUCAP 165* 125*      Recent Results (from the past 240 hour(s))  Resp Panel by RT-PCR (Flu A&B, Covid) Nasopharyngeal Swab     Status: None   Collection Time: 03/16/21  5:09 PM   Specimen: Nasopharyngeal Swab; Nasopharyngeal(NP) swabs in vial transport medium  Result Value Ref Range Status   SARS Coronavirus 2 by RT PCR NEGATIVE NEGATIVE Final    Comment:  (NOTE) SARS-CoV-2 target nucleic acids are NOT DETECTED.  The SARS-CoV-2 RNA is generally detectable in upper respiratory specimens during the acute phase of infection. The lowest concentration of SARS-CoV-2 viral copies this assay can detect is 138 copies/mL. A negative result does not preclude SARS-Cov-2 infection and should not be used as the sole basis for treatment or other patient management decisions. A negative result may occur with  improper specimen collection/handling, submission of specimen other than nasopharyngeal swab, presence of viral mutation(s) within the areas targeted by this assay, and inadequate number of viral copies(<138 copies/mL). A negative result must be combined with clinical observations, patient history, and  epidemiological information. The expected result is Negative.  Fact Sheet for Patients:  EntrepreneurPulse.com.au  Fact Sheet for Healthcare Providers:  IncredibleEmployment.be  This test is no t yet approved or cleared by the [place] FDA and  has been authorized for detection and/or diagnosis of SARS-CoV-2 by FDA under an Emergency Use Authorization (EUA). This EUA will remain  in effect (meaning this test can be used) for the duration of the COVID-19 declaration under Section 564(b)(1) of the Act, 21 U.S.C.section 360bbb-3(b)(1), unless the authorization is terminated  or revoked sooner.       Influenza A by PCR NEGATIVE NEGATIVE Final   Influenza B by PCR NEGATIVE NEGATIVE Final    Comment: (NOTE) The Xpert Xpress SARS-CoV-2/FLU/RSV plus assay is intended as an aid in the diagnosis of influenza from Nasopharyngeal swab specimens and should not be used as a sole basis for treatment. Nasal washings and aspirates are unacceptable for Xpert Xpress SARS-CoV-2/FLU/RSV testing.  Fact Sheet for Patients: EntrepreneurPulse.com.au  Fact Sheet for Healthcare  Providers: IncredibleEmployment.be  This test is not yet approved or cleared by the [place] FDA and has been authorized for detection and/or diagnosis of SARS-CoV-2 by FDA under an Emergency Use Authorization (EUA). This EUA will remain in effect (meaning this test can be used) for the duration of the COVID-19 declaration under Section 564(b)(1) of the Act, 21 U.S.C. section 360bbb-3(b)(1), unless the authorization is terminated or revoked.  Performed at [facility], [address], [place], [place] [address]       Radiology Studies: CT CHEST W CONTRAST  Addendum Date: 03/16/2021   ADDENDUM REPORT: 03/16/2021 11:13 ADDENDUM: These results were called by telephone at the time of interpretation on 03/16/2021 at 11:12 am to provider Koren Shiver (RN) , who verbally acknowledged these results. Dr. Rush Landmark was not availabile. Mrs Gerarda Fraction stated she would relay findings to the "doctor of the day". Electronically Signed   By: Zetta Bills M.D.   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 EXAM: CT CHEST WITH CONTRAST TECHNIQUE: Multidetector CT imaging of the chest was performed during intravenous contrast administration. CONTRAST:  3mL OMNIPAQUE IOHEXOL 300 MG/ML  SOLN COMPARISON:  Abdominal CT from February of 2022 FINDINGS: Cardiovascular: Calcified and noncalcified atheromatous plaque in the thoracic aorta. Normal heart size. No pericardial effusion. The three-vessel coronary artery disease. Central pulmonary vessels are normal caliber but there is evidence of nonocclusive RIGHT lower embolus extending into segmental branches of the RIGHT lower lobe. Borderline are V to LV ratio. Mild straightening of the intraventricular septum. Mediastinum/Nodes: Thoracic inlet structures  are normal. No axillary or thoracic inlet lymphadenopathy. No subpectoral adenopathy. No mediastinal adenopathy. Lungs/Pleura: RIGHT lower lobe nodule (image 112, series 4) 1.2 cm previously 1.4 cm though motion limited measurement assessment on the previous study. Scarring in the RIGHT middle lobe. No effusion. Nodular area at the LEFT lung base with bandlike changes that extend from this area on the current study and on the prior study. This measures approximately 1.4 x 1.2 cm, previously 2.1 x 1.9 cm. Other scattered subpleural nodules in the 4-5 mm range. The mild bronchiectasis in the lingula. Airways are patent. Upper Abdomen: Persistent marked biliary duct distension. Pancreas not imaged. The spleen unremarkable. Adrenal glands are normal. Limited imaging of gastrointestinal tract is unremarkable. Musculoskeletal: No  acute musculoskeletal process. Spinal degenerative changes. No destructive bone finding. Spinal nerve stimulator in place. IMPRESSION: 1. Study is positive for nonocclusive RIGHT lower lobe pulmonary embolus extending into segmental branches of the RIGHT lower lobe. Borderline are V to LV ratio and mild straightening of the intraventricular septum. Correlate with any clinical signs of right heart strain. May consider echocardiography for further assessment. 2. Nodular area at the LEFT lung base with bandlike changes that extend from this area on the current study and on the prior study. This measures approximately 1.4 x 1.2 cm, previously 2.1 x 1.9 cm. This may represent a combination of scarring and atelectasis. This may represent a post inflammatory process given decrease in size. Consider short interval follow-up for further evaluation. 3. Suggestion of slight interval decrease in size as well of a RIGHT lower lobe process with nodular features. Respiratory motion on the prior study did limit assessment. Short interval follow-up may be helpful. Could consider PET imaging if there is high risk  for metastatic disease. 4. Persistent marked biliary duct distension. 5. Aortic atherosclerosis. A call is out to the referring provider to further discuss findings in the above case. Aortic Atherosclerosis (ICD10-I70.0). Electronically Signed: By: Zetta Bills M.D. On: 03/16/2021 11:01   CT PELVIS W CONTRAST  Result Date: 03/16/2021 CLINICAL DATA:  Esophageal cancer. EXAM: CT PELVIS WITH CONTRAST TECHNIQUE: Multidetector CT imaging of the pelvis was performed using the standard protocol following the bolus administration of intravenous contrast. CONTRAST:  2mL OMNIPAQUE IOHEXOL 300 MG/ML  SOLN COMPARISON:  Abdominal CT of the same date. FINDINGS: Urinary Tract: Urinary bladder descends to the pelvic floor. No gross abnormality aside from findings that are suggestive of pelvic floor dysfunction. No distal ureteral dilation. Bowel: Colonic diverticulosis. Diverticular disease of the sigmoid. Abundant stool throughout the colon, visualized portions of the colon as seen on recent abdominal CT. Colon incompletely imaged. Mild small bowel distension over the RIGHT iliac crest. Tethering of the small bowel in this area is suggested. This does not appear to cause complete obstruction based on the pattern of ingested contrast media though distal small bowel loops are potentially collapsed. Bowel distension up to 2.7 cm in the RIGHT lower quadrant with collapse of bowel loops, relative decompression of bowel loops in central abdomen and RIGHT lower pelvis. These may even be proximal to the level of dilation it is difficult to determine on the current evaluation. Vascular/Lymphatic: Calcified atheromatous plaque of the abdominal aorta distally passing into the iliac vessels. Reproductive:  Post hysterectomy. Other:  No ascites.  Small fat containing umbilical hernia. Musculoskeletal: Spinal degenerative changes and evidence of L5 laminectomy. LEFT hip arthroplasty with resultant streak artifact in the pelvis. RIGHT  lower quadrant power pack for spinal nerve stimulation overlies the area immediately adjacent to bowel transition, streak artifact does limit assessment for subtle TS in this location. No acute bone finding.  No destructive bone process. IMPRESSION: 1. Mild small bowel distension over the RIGHT iliac crest. Tethering of the small bowel in this area is suggested perhaps due to adhesions. Partial small bowel obstruction is considered based on pattern, correlate with any abdominal symptoms. 2. Abundant stool throughout the colon, visualized portions of the colon as seen on recent abdominal CT. 3. Colonic diverticulosis without evidence of acute diverticulitis. Signs of pelvic floor dysfunction 4. Small fat containing umbilical hernia. 5. Aortic atherosclerosis. These results were called by telephone at the time of interpretation on 03/16/2021 at 11:12 am to provider Koren Shiver (RN) , who  verbally acknowledged these results. Dr. Rush Landmark was not availabile. Mrs Gerarda Fraction stated she would relay findings to the "doctor of the day". Aortic Atherosclerosis (ICD10-I70.0). Electronically Signed   By: Zetta Bills M.D.   On: 03/16/2021 11:15   US Venous Img Lower Bilateral  Result Date: 03/16/2021 CLINICAL DATA:  Recent PE, lower extremity swelling EXAM: BILATERAL LOWER EXTREMITY VENOUS DOPPLER ULTRASOUND TECHNIQUE: Gray-scale sonography with graded compression, as well as color Doppler and duplex ultrasound were performed to evaluate the lower extremity deep venous systems from the level of the common femoral vein and including the common femoral, femoral, profunda femoral, popliteal and calf veins including the posterior tibial, peroneal and gastrocnemius veins when visible. The superficial great saphenous vein was also interrogated. Spectral Doppler was utilized to evaluate flow at rest and with distal augmentation maneuvers in the common femoral, femoral and popliteal veins. COMPARISON:  None. FINDINGS: RIGHT LOWER  EXTREMITY Common Femoral Vein: No evidence of thrombus. Normal compressibility, respiratory phasicity and response to augmentation. Saphenofemoral Junction: No evidence of thrombus. Normal compressibility and flow on color Doppler imaging. Profunda Femoral Vein: No evidence of thrombus. Normal compressibility and flow on color Doppler imaging. Femoral Vein: No evidence of thrombus. Normal compressibility, respiratory phasicity and response to augmentation. Popliteal Vein: No evidence of thrombus. Normal compressibility, respiratory phasicity and response to augmentation. Calf Veins: Limited assessment of the calf veins. Tibial veins appear patent. Peroneal veins not visualized. No gross thrombus appreciated. Superficial Great Saphenous Vein: No evidence of thrombus. Normal compressibility. Other Findings:  Peripheral subcutaneous edema noted. LEFT LOWER EXTREMITY Common Femoral Vein: No evidence of thrombus. Normal compressibility, respiratory phasicity and response to augmentation. Saphenofemoral Junction: No evidence of thrombus. Normal compressibility and flow on color Doppler imaging. Profunda Femoral Vein: No evidence of thrombus. Normal compressibility and flow on color Doppler imaging. Femoral Vein: No evidence of thrombus. Normal compressibility, respiratory phasicity and response to augmentation. Popliteal Vein: No evidence of thrombus. Normal compressibility, respiratory phasicity and response to augmentation. Calf Veins: Limited assessment of the tibial and peroneal calf veins. No gross thrombus appreciated. Superficial Great Saphenous Vein: No evidence of thrombus. Normal compressibility. Other Findings: Medial popliteal fossa complex thick-walled Baker's cyst measures 5.7 x 1.3 x 2.1 cm IMPRESSION: No significant femoropopliteal DVT. Limited assessment of the calf veins. Peripheral edema noted. 5.7 cm left popliteal fossa Baker's cyst. Electronically Signed   By: Jerilynn Mages.  Shick M.D.   On: 03/16/2021 17:19        LOS: 1 day   [facility] Pager on [email or web address]  03/17/2021, 9:53 AM

## 2021-03-17 NOTE — Progress Notes (Signed)
[place] for heparin Indication: pulmonary embolus  Allergies  Allergen Reactions  . Gramicidin Other (See Comments)    unknown  . Sulfa Antibiotics Rash  . Wound Dressing Adhesive Rash    Patient Measurements: Height: 5\' 4"  (162.6 cm) Weight: 73.2 kg (161 lb 6.4 oz) IBW/kg (Calculated) : 54.7 Heparin Dosing Weight: 68 kg  Vital Signs: Temp: 98.5 F (36.9 C) (04/02 0046) Temp Source: Oral (04/02 0046) BP: 146/44 (04/02 0046) Pulse Rate: 54 (04/02 0046)  Labs: Recent Labs    03/15/21 1621 03/16/21 1455 03/17/21 0001  HGB  --  12.9 10.9*  HCT  --  39.8 35.4*  PLT  --  216 174  APTT  --  37*  --   LABPROT  --  12.2  --   INR  --  0.9  --   HEPARINUNFRC  --   --  0.22*  CREATININE 0.80 0.96 0.88  TROPONINIHS  --  11  --     Estimated Creatinine Clearance: 40.8 mL/min (by C-G formula based on SCr of 0.88 mg/dL).   Medical History: Past Medical History:  Diagnosis Date  . Arthritis   . Chicken pox   . Diabetes mellitus without complication ([place])   . Diverticulitis   . Esophageal cancer ([place]) dx'd 2020  . GERD (gastroesophageal reflux disease)   . Hyperlipidemia   . Hypertension   . OSA (obstructive sleep apnea)   . Urine incontinence   . Uterine cancer ([facility])     Medications:  Scheduled:  . docusate sodium  100 mg Oral Daily  . insulin aspart  0-9 Units Subcutaneous TID WC  . irbesartan  37.5 mg Oral Daily  . lipase/protease/amylase  36,000 Units Oral BID  . morphine  30 mg Oral Q12H    Assessment:  Patient with new PE and recent diagnosis of esophageal cancer.  Pharmacy consulted to dose IV heparin.  Not on anticoagulation PTA.  Baseline CBC, PT/INR, and aPTT were WNL.  03/17/2021:  Initial heparin level 0.22- subtherapeutic on IV heparin 1050 units/hr  CBC: Hg slightly low at 10.9, pltc WNL  No bleeding or infusion related concerns per RN  Goal of Therapy:  Heparin level 0.3-0.7 units/ml Monitor  platelets by anticoagulation protocol: Yes   Plan:  Re-bolus IV heparin 1000 units x1 then increase IV heparin rate to 1250 units/hr Recheck heparin level in 8 hours Daily heparin level & CBC while on heparin Noted plans to hopefully switch to [place] later today  Netta Cedars PharmD, BCPS 03/17/2021,12:54 AM

## 2021-03-17 NOTE — Plan of Care (Signed)
  Problem: Clinical Measurements: Goal: Ability to maintain clinical measurements within normal limits will improve Outcome: Progressing   

## 2021-03-17 NOTE — Progress Notes (Signed)
[facility] for heparin Indication: pulmonary embolus  Allergies  Allergen Reactions  . Gramicidin Other (See Comments)    unknown  . Sulfa Antibiotics Rash  . Wound Dressing Adhesive Rash    Patient Measurements: Height: 5\' 4"  (162.6 cm) Weight: 73.2 kg (161 lb 6.4 oz) IBW/kg (Calculated) : 54.7 Heparin Dosing Weight: 68 kg  Vital Signs: Temp: 98.5 F (36.9 C) (04/02 0446) Temp Source: Oral (04/02 0446) BP: 136/57 (04/02 0446) Pulse Rate: 54 (04/02 0446)  Labs: Recent Labs    03/15/21 1621 03/16/21 1455 03/17/21 0001 03/17/21 0933  HGB  --  12.9 10.9*  --   HCT  --  39.8 35.4*  --   PLT  --  216 174  --   APTT  --  37*  --   --   LABPROT  --  12.2  --   --   INR  --  0.9  --   --   HEPARINUNFRC  --   --  0.22* 0.37  CREATININE 0.80 0.96 0.88  --   TROPONINIHS  --  11  --   --     Estimated Creatinine Clearance: 40.8 mL/min (by C-G formula based on SCr of 0.88 mg/dL).   Medical History: Past Medical History:  Diagnosis Date  . Arthritis   . Chicken pox   . Diabetes mellitus without complication ([place])   . Diverticulitis   . Esophageal cancer ([place]) dx'd 2020  . GERD (gastroesophageal reflux disease)   . Hyperlipidemia   . Hypertension   . OSA (obstructive sleep apnea)   . Urine incontinence   . Uterine cancer ([facility])     Medications:  Scheduled:  . docusate sodium  100 mg Oral BID  . insulin aspart  0-9 Units Subcutaneous TID WC  . irbesartan  37.5 mg Oral Daily  . latanoprost  1 drop Both Eyes QHS  . lipase/protease/amylase  36,000 Units Oral BID  . mirtazapine  15 mg Oral Q supper  . morphine  30 mg Oral Q12H  . polyethylene glycol  17 g Oral BID    Assessment: Patient with new PE and recent diagnosis of esophageal cancer.  Pharmacy consulted to transition from IV UFH to rivaroxaban.   Plan:  Transition to rivaroxaban 15 mg bid x 21 days followed by 20 mg daily thereafter Will instruct RN to turn off  heparin drip at the time of initial rivaroxaban dose Will educate patient  Napoleon Form 03/17/2021,10:05 AM

## 2021-03-17 NOTE — Progress Notes (Signed)
  Echocardiogram 2D Echocardiogram has been performed.  Sante Biedermann G Demetris Capell 03/17/2021, 10:35 AM

## 2021-03-18 DIAGNOSIS — I2609 Other pulmonary embolism with acute cor pulmonale: Secondary | ICD-10-CM | POA: Diagnosis not present

## 2021-03-18 LAB — BASIC METABOLIC PANEL
Anion gap: 6 (ref 5–15)
BUN: 20 mg/dL (ref 8–23)
CO2: 27 mmol/L (ref 22–32)
Calcium: 9.2 mg/dL (ref 8.9–10.3)
Chloride: 105 mmol/L (ref 98–111)
Creatinine, Ser: 0.8 mg/dL (ref 0.44–1.00)
GFR, Estimated: >60 mL/min (ref >60)
Glucose, Bld: 161 mg/dL — ABNORMAL HIGH (ref 70–99)
Potassium: 4.3 mmol/L (ref 3.5–5.1)
Sodium: 138 mmol/L (ref 135–145)

## 2021-03-18 LAB — CBC
HCT: 37.7 % (ref 36.0–46.0)
Hemoglobin: 11.6 g/dL — ABNORMAL LOW (ref 12.0–15.0)
MCH: 27.2 pg (ref 26.0–34.0)
MCHC: 30.8 g/dL (ref 30.0–36.0)
MCV: 88.3 fL (ref 80.0–100.0)
Platelets: 190 10*3/uL (ref 150–400)
RBC: 4.27 MIL/uL (ref 3.87–5.11)
RDW: 15 % (ref 11.5–15.5)
WBC: 7.6 10*3/uL (ref 4.0–10.5)
nRBC: 0 % (ref 0.0–0.2)

## 2021-03-18 LAB — GLUCOSE, CAPILLARY: Glucose-Capillary: 161 mg/dL — ABNORMAL HIGH (ref 70–99)

## 2021-03-18 MED ORDER — RIVAROXABAN (XARELTO) VTE STARTER PACK (15 & 20 MG): ORAL_TABLET | ORAL | 0 refills | Status: DC

## 2021-03-18 MED ORDER — DOCUSATE SODIUM 100 MG PO CAPS: 100.0000 mg | ORAL_CAPSULE | Freq: Two times a day (BID) | ORAL | 0 refills | Status: DC

## 2021-03-18 MED ORDER — RIVAROXABAN 20 MG PO TABS: 20.0000 mg | ORAL_TABLET | Freq: Every day | ORAL | 2 refills | Status: DC

## 2021-03-18 NOTE — Discharge Summary (Signed)
[facility]  Physician Discharge Summary   Patient ID: Sherry Holloway MRN: [id number] DOB/AGE: 1928-08-06 87 y.o.  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 Follow-up with oncology.  Patient has appointment tomorrow. 2. The incidental finding in the left lung base may need further monitoring in the outpatient setting.    Home Health: None Equipment/Devices: None  CODE STATUS: Full code  DISCHARGE CONDITION: fair  Diet recommendation: As before  INITIAL HISTORY: 87 y.o.femalewith medical history significant forrecently diagnosed invasive squamous cell esophageal carcinoma, type 2 diabetes, hypertension who presented to the ED after staging imaging showed changes concerning for right lower lobe PE and possible partial SBO.  Patient was hospitalized for further management.  Consultations:  None  Procedures:  Transthoracic echocardiogram.  See report below.    HOSPITAL COURSE:   Acute pulmonary embolism in the right lower lobe of the lung Recently diagnosed with esophageal cancer.  Likely hypercoagulable due to her malignancy.  Patient was started on IV heparin.    Slight drop in hemoglobin was noted which was likely dilutional.  Stable this morning.  Echocardiogram does not show any evidence for right heart strain.  Patient was changed over to rivaroxaban.  Lower extremity Doppler studies were negative for DVT.  Patient will be discharged on rivaroxaban. She feels better.  She ambulated without any difficulty.  Constipation with concern for mild partial small bowel obstruction This was incidentally noted on CT scan.  A lot of stool burden was noted.  Patient does not have any abdominal  pain or any any other GI symptoms currently.  She has been passing gas.    Patient very reluctant to try MiraLAX as she previously had some incontinence with that.  She and her daughter were educated about constipation especially considering that she is on narcotics chronically.  She said that she will take MiraLAX over-the-counter if she does not have a bowel movement in 24 hours.  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  Supposed to see Dr. Benay Spice on Monday 4/4 for initial visit with oncology.  Diabetes mellitus type 2 Stable  Essential hypertension Valsartan being continued.  Chronic back pain Noted to be on MS Contin at home which is being continued.    Incidental lung findings on CT scan Nodular area at the LEFT lung base with bandlike changes that extend from this area on the current study and on the prior study. This measures approximately 1.4 x 1.2 cm, previously 2.1 x 1.9 cm. This may represent a combination of scarring and atelectasis. This may represent a post inflammatory process given decrease in size.  Will need outpatient monitoring.   Patient is stable.  Okay for discharge home today.  Discussed with daughter.   PERTINENT LABS:  The results of significant diagnostics from this hospitalization (including imaging, microbiology, ancillary and laboratory) are listed below for reference.    Microbiology: Recent Results (from the past 240 hour(s))  Resp Panel by RT-PCR (Flu A&B, Covid) Nasopharyngeal Swab     Status: None   Collection Time: 03/16/21  5:09 PM   Specimen: Nasopharyngeal Swab; Nasopharyngeal(NP) swabs in vial transport medium  Result Value Ref Range Status   SARS Coronavirus 2 by RT  PCR NEGATIVE NEGATIVE Final    Comment: (NOTE) SARS-CoV-2 target nucleic acids are NOT DETECTED.  The SARS-CoV-2 RNA is generally detectable in upper  respiratory specimens during the acute phase of infection. The lowest concentration of SARS-CoV-2 viral copies this assay can detect is 138 copies/mL. A negative result does not preclude SARS-Cov-2 infection and should not be used as the sole basis for treatment or other patient management decisions. A negative result may occur with  improper specimen collection/handling, submission of specimen other than nasopharyngeal swab, presence of viral mutation(s) within the areas targeted by this assay, and inadequate number of viral copies(<138 copies/mL). A negative result must be combined with clinical observations, patient history, and epidemiological information. The expected result is Negative.  Fact Sheet for Patients:  EntrepreneurPulse.com.au  Fact Sheet for Healthcare Providers:  IncredibleEmployment.be  This test is no t yet approved or cleared by the [place] FDA and  has been authorized for detection and/or diagnosis of SARS-CoV-2 by FDA under an Emergency Use Authorization (EUA). This EUA will remain  in effect (meaning this test can be used) for the duration of the COVID-19 declaration under Section 564(b)(1) of the Act, 21 U.S.C.section 360bbb-3(b)(1), unless the authorization is terminated  or revoked sooner.       Influenza A by PCR NEGATIVE NEGATIVE Final   Influenza B by PCR NEGATIVE NEGATIVE Final    Comment: (NOTE) The Xpert Xpress SARS-CoV-2/FLU/RSV plus assay is intended as an aid in the diagnosis of influenza from Nasopharyngeal swab specimens and should not be used as a sole basis for treatment. Nasal washings and aspirates are unacceptable for Xpert Xpress SARS-CoV-2/FLU/RSV testing.  Fact Sheet for Patients: EntrepreneurPulse.com.au  Fact Sheet for Healthcare Providers: IncredibleEmployment.be  This test is not yet approved or cleared by the [place] FDA and has been  authorized for detection and/or diagnosis of SARS-CoV-2 by FDA under an Emergency Use Authorization (EUA). This EUA will remain in effect (meaning this test can be used) for the duration of the COVID-19 declaration under Section 564(b)(1) of the Act, 21 U.S.C. section 360bbb-3(b)(1), unless the authorization is terminated or revoked.  Performed at [facility], [address] [place], [place] [address]      Labs:  COVID-19 Labs   Lab Results  Component Value Date   SARSCOV2NAA NEGATIVE 03/16/2021   [place] NEGATIVE 03/01/2021      Basic Metabolic Panel: Recent Labs  Lab 03/15/21 1621 03/16/21 1455 03/17/21 0001 03/18/21 0332  NA  --  138 137 138  K  --  4.2 4.1 4.3  CL  --  102 106 105  CO2  --  28 25 27   GLUCOSE  --  131* 180* 161*  BUN  --  18 16 20   CREATININE 0.80 0.96 0.88 0.80  CALCIUM  --  9.5 8.9 9.2   Liver Function Tests: Recent Labs  Lab 03/16/21 1455  AST 15  ALT 25  ALKPHOS 73  BILITOT 0.1*  PROT 7.2  ALBUMIN 3.9   CBC: Recent Labs  Lab 03/16/21 1455 03/17/21 0001 03/18/21 0332  WBC 7.6 7.3 7.6  NEUTROABS 4.2  --   --   HGB 12.9 10.9* 11.6*  HCT 39.8 35.4* 37.7  MCV 84.1 87.2 88.3  PLT 216 174 190   CBG: Recent Labs  Lab 03/17/21 0725 03/17/21 1059 03/17/21 1611 03/17/21 2110 03/18/21 0726  GLUCAP 125* 246* 135* 208* 161*     IMAGING STUDIES CT CHEST W CONTRAST  Addendum  Date: 03/16/2021   ADDENDUM REPORT: 03/16/2021 11:13 ADDENDUM: These results were called by telephone at the time of interpretation on 03/16/2021 at 11:12 am to provider Koren Shiver (RN) , who verbally acknowledged these results. Dr. Rush Landmark was not availabile. Mrs Gerarda Fraction stated she would relay findings to the "doctor of the day". Electronically Signed   By: Zetta Bills M.D.   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 EXAM: CT CHEST WITH CONTRAST TECHNIQUE: Multidetector CT imaging of the chest was performed during intravenous contrast administration. CONTRAST:  40mL OMNIPAQUE IOHEXOL 300 MG/ML  SOLN COMPARISON:  Abdominal CT from February of 2022 FINDINGS: Cardiovascular: Calcified and noncalcified atheromatous plaque in the thoracic aorta. Normal heart size. No pericardial effusion. The three-vessel coronary artery disease. Central pulmonary vessels are normal caliber but there is evidence of nonocclusive RIGHT lower embolus extending into segmental branches of the RIGHT lower lobe. Borderline are V to LV ratio. Mild straightening of the intraventricular septum. Mediastinum/Nodes: Thoracic inlet structures are normal. No axillary or thoracic inlet lymphadenopathy. No subpectoral adenopathy. No mediastinal adenopathy. Lungs/Pleura: RIGHT lower lobe nodule (image 112, series 4) 1.2 cm previously 1.4 cm though motion limited measurement assessment on the previous study. Scarring in the RIGHT middle lobe. No effusion. Nodular area at the LEFT lung base with bandlike changes that extend from this area on the current study and on the prior study. This measures approximately 1.4 x 1.2 cm, previously 2.1 x 1.9 cm. Other scattered subpleural nodules in the 4-5 mm range. The mild bronchiectasis in the lingula. Airways are patent. Upper Abdomen: Persistent marked biliary duct distension. Pancreas not imaged. The spleen unremarkable. Adrenal glands are normal. Limited imaging of gastrointestinal tract is unremarkable. Musculoskeletal: No acute musculoskeletal process. Spinal degenerative changes. No destructive bone finding. Spinal nerve stimulator in place. IMPRESSION: 1. Study is positive for nonocclusive RIGHT lower lobe pulmonary embolus extending into segmental branches of the RIGHT lower lobe. Borderline are V to LV ratio and mild straightening of the  intraventricular septum. Correlate with any clinical signs of right heart strain. May consider echocardiography for further assessment. 2. Nodular area at the LEFT lung base with bandlike changes that extend from this area on the current study and on the prior study. This measures approximately 1.4 x 1.2 cm, previously 2.1 x 1.9 cm. This may represent a combination of scarring and atelectasis. This may represent a post inflammatory process given decrease in size. Consider short interval follow-up for further evaluation. 3. Suggestion of slight interval decrease in size as well of a RIGHT lower lobe process with nodular features. Respiratory motion on the prior study did limit assessment. Short interval follow-up may be helpful. Could consider PET imaging if there is high risk for metastatic disease. 4. Persistent marked biliary duct distension. 5. Aortic atherosclerosis. A call is out to the referring provider to further discuss findings in the above case. Aortic Atherosclerosis (ICD10-I70.0). Electronically Signed: By: Zetta Bills M.D. On: 03/16/2021 11:01   CT PELVIS W CONTRAST  Result Date: 03/16/2021 CLINICAL DATA:  Esophageal cancer. EXAM: CT PELVIS WITH CONTRAST TECHNIQUE: Multidetector CT imaging of the pelvis was performed using the standard protocol following the bolus administration of intravenous contrast. CONTRAST:  71mL OMNIPAQUE IOHEXOL 300 MG/ML  SOLN COMPARISON:  Abdominal CT of the same date. FINDINGS: Urinary Tract: Urinary  bladder descends to the pelvic floor. No gross abnormality aside from findings that are suggestive of pelvic floor dysfunction. No distal ureteral dilation. Bowel: Colonic diverticulosis. Diverticular disease of the sigmoid. Abundant stool throughout the colon, visualized portions of the colon as seen on recent abdominal CT. Colon incompletely imaged. Mild small bowel distension over the RIGHT iliac crest. Tethering of the small bowel in this area is suggested. This does  not appear to cause complete obstruction based on the pattern of ingested contrast media though distal small bowel loops are potentially collapsed. Bowel distension up to 2.7 cm in the RIGHT lower quadrant with collapse of bowel loops, relative decompression of bowel loops in central abdomen and RIGHT lower pelvis. These may even be proximal to the level of dilation it is difficult to determine on the current evaluation. Vascular/Lymphatic: Calcified atheromatous plaque of the abdominal aorta distally passing into the iliac vessels. Reproductive:  Post hysterectomy. Other:  No ascites.  Small fat containing umbilical hernia. Musculoskeletal: Spinal degenerative changes and evidence of L5 laminectomy. LEFT hip arthroplasty with resultant streak artifact in the pelvis. RIGHT lower quadrant power pack for spinal nerve stimulation overlies the area immediately adjacent to bowel transition, streak artifact does limit assessment for subtle TS in this location. No acute bone finding.  No destructive bone process. IMPRESSION: 1. Mild small bowel distension over the RIGHT iliac crest. Tethering of the small bowel in this area is suggested perhaps due to adhesions. Partial small bowel obstruction is considered based on pattern, correlate with any abdominal symptoms. 2. Abundant stool throughout the colon, visualized portions of the colon as seen on recent abdominal CT. 3. Colonic diverticulosis without evidence of acute diverticulitis. Signs of pelvic floor dysfunction 4. Small fat containing umbilical hernia. 5. Aortic atherosclerosis. These results were called by telephone at the time of interpretation on 03/16/2021 at 11:12 am to provider Koren Shiver (RN) , who verbally acknowledged these results. Dr. Rush Landmark was not availabile. Mrs Gerarda Fraction stated she would relay findings to the "doctor of the day". Aortic Atherosclerosis (ICD10-I70.0). Electronically Signed   By: Zetta Bills M.D.   On: 03/16/2021 11:15   US Venous  Img Lower Bilateral  Result Date: 03/16/2021 CLINICAL DATA:  Recent PE, lower extremity swelling EXAM: BILATERAL LOWER EXTREMITY VENOUS DOPPLER ULTRASOUND TECHNIQUE: Gray-scale sonography with graded compression, as well as color Doppler and duplex ultrasound were performed to evaluate the lower extremity deep venous systems from the level of the common femoral vein and including the common femoral, femoral, profunda femoral, popliteal and calf veins including the posterior tibial, peroneal and gastrocnemius veins when visible. The superficial great saphenous vein was also interrogated. Spectral Doppler was utilized to evaluate flow at rest and with distal augmentation maneuvers in the common femoral, femoral and popliteal veins. COMPARISON:  None. FINDINGS: RIGHT LOWER EXTREMITY Common Femoral Vein: No evidence of thrombus. Normal compressibility, respiratory phasicity and response to augmentation. Saphenofemoral Junction: No evidence of thrombus. Normal compressibility and flow on color Doppler imaging. Profunda Femoral Vein: No evidence of thrombus. Normal compressibility and flow on color Doppler imaging. Femoral Vein: No evidence of thrombus. Normal compressibility, respiratory phasicity and response to augmentation. Popliteal Vein: No evidence of thrombus. Normal compressibility, respiratory phasicity and response to augmentation. Calf Veins: Limited assessment of the calf veins. Tibial veins appear patent. Peroneal veins not visualized. No gross thrombus appreciated. Superficial Great Saphenous Vein: No evidence of thrombus. Normal compressibility. Other Findings:  Peripheral subcutaneous edema noted. LEFT LOWER EXTREMITY Common Femoral Vein: No evidence  of thrombus. Normal compressibility, respiratory phasicity and response to augmentation. Saphenofemoral Junction: No evidence of thrombus. Normal compressibility and flow on color Doppler imaging. Profunda Femoral Vein: No evidence of thrombus. Normal  compressibility and flow on color Doppler imaging. Femoral Vein: No evidence of thrombus. Normal compressibility, respiratory phasicity and response to augmentation. Popliteal Vein: No evidence of thrombus. Normal compressibility, respiratory phasicity and response to augmentation. Calf Veins: Limited assessment of the tibial and peroneal calf veins. No gross thrombus appreciated. Superficial Great Saphenous Vein: No evidence of thrombus. Normal compressibility. Other Findings: Medial popliteal fossa complex thick-walled Baker's cyst measures 5.7 x 1.3 x 2.1 cm IMPRESSION: No significant femoropopliteal DVT. Limited assessment of the calf veins. Peripheral edema noted. 5.7 cm left popliteal fossa Baker's cyst. Electronically Signed   By: Jerilynn Mages.  Shick M.D.   On: 03/16/2021 17:19   DG ERCP  Result Date: 03/05/2021 CLINICAL DATA:  87 year old female with history of pancreatic mass and biliary ductal dilation. EXAM: ERCP TECHNIQUE: Multiple spot images obtained with the fluoroscopic device and submitted for interpretation post-procedure. FLUOROSCOPY TIME:  Fluoroscopy Time:  8 minutes, 55 seconds Number of Acquired Spot Images: 16 COMPARISON:  CT abdomen pelvis from 01/25/2021 FINDINGS: Duodenal scope is positioned in the second portion the duodenum with apparent retrograde cannulation of the common bile duct. Limited retrograde cholangiogram demonstrates severe dilation of the common bile duct and moderate intrahepatic biliary ductal dilation. ERCP images are suggestive of balloon sweep and sphincterotomy. IMPRESSION: Severe common bile duct dilation and moderate intrahepatic biliary ductal dilation. Balloon sweep and sphincterotomy were performed. Please refer to the operative note for further procedural details. These images were submitted for radiologic interpretation only. Please see the procedural report for the amount of contrast and the fluoroscopy time utilized. Ruthann Cancer, MD [facility] Electronically Signed   By: Ruthann Cancer MD   On: 03/05/2021 12:44   ECHOCARDIOGRAM COMPLETE  Result Date: 03/17/2021    ECHOCARDIOGRAM REPORT   Patient Name:   Daesha Vandyke Date of Exam: 03/17/2021 Medical Rec #:  [id number]      Height:       64.0 in Accession #:    [id number]     Weight:       161.4 lb Date of Birth:  [birth year]      BSA:          1.786 m Patient Age:    48 years       BP:           136/57 mmHg Patient Gender: F              HR:           60 bpm. Exam Location:  Inpatient Procedure: 2D Echo, Cardiac Doppler and Color Doppler Indications:    I26.02 Pulmonary embolus  History:        Patient has no prior history of Echocardiogram examinations.                 Risk Factors:Hypertension, Diabetes, Dyslipidemia, GERD and                 Sleep Apnea. Cancer.  Sonographer:    Jonelle Sidle Dance Referring Phys: [id number] [place]  1. Left ventricular ejection fraction, by estimation, is 60 to 65%. The left ventricle has normal function. The left ventricle has no regional wall motion abnormalities. There is mild left ventricular hypertrophy. Left ventricular diastolic parameters are consistent with Grade I  diastolic dysfunction (impaired relaxation).  2. Right ventricular systolic function is normal. The right ventricular size is normal.  3. The mitral valve is normal in structure. No evidence of mitral valve regurgitation. No evidence of mitral stenosis.  4. The aortic valve is tricuspid. Aortic valve regurgitation is not visualized. Mild aortic valve sclerosis is present, with no evidence of aortic valve stenosis.  5. The inferior vena cava is normal in size with greater than 50% respiratory variability, suggesting right atrial pressure of 3 mmHg. FINDINGS  Left Ventricle: Left ventricular ejection fraction, by estimation, is 60 to 65%. The left ventricle has normal function. The left ventricle has no regional wall motion abnormalities. The left  ventricular internal cavity size was normal in size. There is  mild left ventricular hypertrophy. Left ventricular diastolic parameters are consistent with Grade I diastolic dysfunction (impaired relaxation). Right Ventricle: The right ventricular size is normal. No increase in right ventricular wall thickness. Right ventricular systolic function is normal. Left Atrium: Left atrial size was normal in size. Right Atrium: Right atrial size was normal in size. Pericardium: There is no evidence of pericardial effusion. Mitral Valve: The mitral valve is normal in structure. Mild mitral annular calcification. No evidence of mitral valve regurgitation. No evidence of mitral valve stenosis. Tricuspid Valve: The tricuspid valve is normal in structure. Tricuspid valve regurgitation is trivial. No evidence of tricuspid stenosis. Aortic Valve: The aortic valve is tricuspid. Aortic valve regurgitation is not visualized. Mild aortic valve sclerosis is present, with no evidence of aortic valve stenosis. Pulmonic Valve: The pulmonic valve was normal in structure. Pulmonic valve regurgitation is not visualized. No evidence of pulmonic stenosis. Aorta: The aortic root is normal in size and structure. Venous: The inferior vena cava is normal in size with greater than 50% respiratory variability, suggesting right atrial pressure of 3 mmHg. IAS/Shunts: No atrial level shunt detected by color flow Doppler.  LEFT VENTRICLE PLAX 2D LVIDd:         4.20 cm  Diastology LVIDs:         2.70 cm  LV e' medial:    4.46 cm/s LV PW:         1.40 cm  LV E/e' medial:  22.2 LV IVS:        1.20 cm  LV e' lateral:   6.96 cm/s LVOT diam:     2.10 cm  LV E/e' lateral: 14.2 LV SV:         94 LV SV Index:   53 LVOT Area:     3.46 cm  RIGHT VENTRICLE             IVC RV Basal diam:  2.50 cm     IVC diam: 1.80 cm RV S prime:     12.80 cm/s TAPSE (M-mode): 2.0 cm LEFT ATRIUM             Index       RIGHT ATRIUM           Index LA diam:        3.90 cm 2.18 cm/m   RA Area:     11.70 cm LA Vol (A2C):   61.3 ml 34.32 ml/m RA Volume:   22.30 ml  12.49 ml/m LA Vol (A4C):   52.2 ml 29.23 ml/m LA Biplane Vol: 59.7 ml 33.43 ml/m  AORTIC VALVE LVOT Vmax:   110.00 cm/s LVOT Vmean:  74.600 cm/s LVOT VTI:    0.272 m  AORTA Ao Root diam: 3.20  cm Ao Asc diam:  3.20 cm MITRAL VALVE MV Area (PHT): 2.29 cm     SHUNTS MV Decel Time: 331 msec     Systemic VTI:  0.27 m MV E velocity: 98.80 cm/s   Systemic Diam: 2.10 cm MV A velocity: 122.00 cm/s MV E/A ratio:  0.81 Candee Furbish MD Electronically signed by Candee Furbish MD Signature Date/Time: 03/17/2021/1:49:21 PM    Final     DISCHARGE EXAMINATION: Vitals:   03/17/21 1238 03/17/21 1243 03/17/21 2108 03/18/21 0553  BP: (!) 136/38 (!) 140/45 (!) 146/52 (!) 180/49  Pulse: (!) 59 (!) 58 61 (!) 56  Resp: 19  20 16   Temp: 98.7 F (37.1 C)  98.5 F (36.9 C) 98.4 F (36.9 C)  TempSrc: Oral  Oral Oral  SpO2: 93%  93% 94%  Weight:      Height:       General appearance: Awake alert.  In no distress Resp: Clear to auscultation bilaterally.  Normal effort Cardio: S1-S2 is normal regular.  No S3-S4.  No rubs murmurs or bruit GI: Abdomen is soft.  Nontender nondistended.  Bowel sounds are present normal.  No masses organomegaly     DISPOSITION: Home  Discharge Instructions    Call MD for:  difficulty breathing, headache or visual disturbances   Complete by: As directed    Call MD for:  extreme fatigue   Complete by: As directed    Call MD for:  hives   Complete by: As directed    Call MD for:  persistant dizziness or light-headedness   Complete by: As directed    Call MD for:  persistant nausea and vomiting   Complete by: As directed    Call MD for:  severe uncontrolled pain   Complete by: As directed    Call MD for:  temperature >100.4   Complete by: As directed    Diet - low sodium heart healthy   Complete by: As directed    Discharge instructions   Complete by: As directed    Please be sure to keep your  appointment with Dr. Benay Spice tomorrow.  Follow-up with your primary care provider in 1 week.  Take MiraLAX if you do not have a bowel movement by tomorrow. Since you are now on a blood thinner you will need to monitor yourself carefully for signs of bleeding.  Seek attention if you see any bleeding from any site.  Seek attention if you see black-colored stool or if you feel very dizzy lightheaded or extremely fatigued.  You were cared for by a hospitalist during your hospital stay. If you have any questions about your discharge medications or the care you received while you were in the hospital after you are discharged, you can call the unit and asked to speak with the hospitalist on call if the hospitalist that took care of you is not available. Once you are discharged, your primary care physician will handle any further medical issues. Please note that NO REFILLS for any discharge medications will be authorized once you are discharged, as it is imperative that you return to your primary care physician (or establish a relationship with a primary care physician if you do not have one) for your aftercare needs so that they can reassess your need for medications and monitor your lab values. If you do not have a primary care physician, you can call [phone] for a physician referral.   Increase activity slowly   Complete by: As directed  Allergies as of 03/18/2021      Reactions   Gramicidin Other (See Comments)   unknown   Sulfa Antibiotics Rash   Wound Dressing Adhesive Rash      Medication List    TAKE these medications   acetaminophen 325 MG tablet Commonly known as: TYLENOL Take 352-650 mg by mouth every 6 (six) hours as needed for mild pain or moderate pain.   bumetanide 1 MG tablet Commonly known as: BUMEX Take 1 tablet (1 mg total) by mouth daily. Take 1/2 to 1 tablet daily for swelling What changed:   how much to take  additional instructions   docusate sodium 100 MG  capsule Commonly known as: Colace Take 1 capsule (100 mg total) by mouth 2 (two) times daily. What changed:   medication strength  when to take this   famciclovir 125 MG tablet Commonly known as: FAMVIR Take 1 tablet (125 mg total) by mouth daily.   fluorouracil 5 % cream Commonly known as: Efudex Apply topically 2 (two) times daily. What changed:   how much to take  when to take this  additional instructions   glimepiride 2 MG tablet Commonly known as: AMARYL Take 2 mg by mouth daily with supper.   glimepiride 4 MG tablet Commonly known as: AMARYL Take 1 tablet (4 mg total) by mouth daily before breakfast.   glucose blood test strip Check blood sugars twice daily   latanoprost 0.005 % ophthalmic solution Commonly known as: XALATAN Place 1 drop into both eyes at bedtime.   lipase/protease/amylase 36000 UNITS Cpep capsule Commonly known as: Creon Take 1 capsule (36,000 Units total) by mouth 3 (three) times daily before meals. What changed: when to take this   mirtazapine 15 MG tablet Commonly known as: REMERON Take 1 tablet (15 mg total) by mouth at bedtime. What changed: when to take this   morphine 30 MG 12 hr tablet Commonly known as: MS CONTIN Take 30 mg by mouth every 12 (twelve) hours.   omeprazole 20 MG capsule Commonly known as: PRILOSEC Take 1 capsule (20 mg total) by mouth 2 (two) times daily before a meal.   Rivaroxaban Stater Pack (15 mg and 20 mg) Commonly known as: [place] PACK Follow package directions: Take one 15mg  tablet by mouth twice a day. On day 22, switch to one 20mg  tablet once a day. Take with food.   rivaroxaban 20 MG Tabs tablet Commonly known as: XARELTO Take 1 tablet (20 mg total) by mouth daily with supper. Please start only after the starter pack has been completed. Start taking on: April 07, 2021   valsartan 40 MG tablet Commonly known as: DIOVAN Take 20 mg by mouth daily with breakfast.   WOMENS 50+ MULTI  VITAMIN/MIN PO Take 1 tablet by mouth daily with supper.         Follow-up Information    Ann Held, DO. Schedule an appointment as soon as possible for a visit in 1 week(s).   Specialty: Family Medicine Contact information: [address] [place] [address] [facility].   Specialty: Oncology Why: With Dr. Benay Spice on 03/19/21 at 1:40pm. Please in AM to confirm. Contact information: [place] [address] [phone]              TOTAL DISCHARGE TIME: 21 minutes  [facility] Pager on [email or web address]  03/19/2021, 10:20 AM

## 2021-03-18 NOTE — Plan of Care (Signed)
Patient discharged, all care plans met ° °

## 2021-03-18 NOTE — Progress Notes (Signed)
Reviewed dc instructions with both patient and daughter (who is a Marine scientist). Both verbalized understanding of dc instructions and instructions r/t xarelto.

## 2021-03-18 NOTE — Discharge Instructions (Signed)
Information on my medicine - XARELTO (rivaroxaban)  This medication education was reviewed with me or my healthcare representative as part of my discharge preparation.  The pharmacist that spoke with me during my hospital stay was:  Napoleon Form, [place]? Xarelto was prescribed to treat blood clots that may have been found in the veins of your legs (deep vein thrombosis) or in your lungs (pulmonary embolism) and to reduce the risk of them occurring again.  What do you need to know about Xarelto? The starting dose is one 15 mg tablet taken TWICE daily with food for the FIRST 21 DAYS then on  4/23  the dose is changed to one 20 mg tablet taken ONCE A DAY with your evening meal.  DO NOT stop taking Xarelto without talking to the health care provider who prescribed the medication.  Refill your prescription for 20 mg tablets before you run out.  After discharge, you should have regular check-up appointments with your healthcare provider that is prescribing your Xarelto.  In the future your dose may need to be changed if your kidney function changes by a significant amount.  What do you do if you miss a dose? If you are taking Xarelto TWICE DAILY and you miss a dose, take it as soon as you remember. You may take two 15 mg tablets (total 30 mg) at the same time then resume your regularly scheduled 15 mg twice daily the next day.  If you are taking Xarelto ONCE DAILY and you miss a dose, take it as soon as you remember on the same day then continue your regularly scheduled once daily regimen the next day. Do not take two doses of Xarelto at the same time.   Important Safety Information Xarelto is a blood thinner medicine that can cause bleeding. You should call your healthcare provider right away if you experience any of the following: ? Bleeding from an injury or your nose that does not stop. ? Unusual colored urine (red or dark brown) or unusual colored  stools (red or black). ? Unusual bruising for unknown reasons. ? A serious fall or if you hit your head (even if there is no bleeding).  Some medicines may interact with Xarelto and might increase your risk of bleeding while on Xarelto. To help avoid this, consult your healthcare provider or pharmacist prior to using any new prescription or non-prescription medications, including herbals, vitamins, non-steroidal anti-inflammatory drugs (NSAIDs) and supplements.  This website has more information on Xarelto: [email or web address]://guerra-benson.[email or web address]     Pulmonary Embolism  A pulmonary embolism (PE) is a sudden blockage or decrease of blood flow in one or both lungs that happens when a clot travels into the arteries of the lung (pulmonary arteries). Most blockages come from a blood clot that forms in the vein of a leg or arm (deep vein thrombosis, DVT) and travels to the lungs. A clot is blood that has thickened into a gel or solid. PE is a dangerous and life-threatening condition that needs to be treated right away. What are the causes? This condition is usually caused by a blood clot that forms in a vein and moves to the lungs. In rare cases, it may be caused by air, fat, part of a tumor, or other tissue that moves through the veins and into the lungs. What increases the risk? The following factors may make you more likely to develop this condition:  Experiencing a traumatic injury, such as breaking  a hip or leg.  Having: ? A spinal cord injury. ? Major surgery, especially hip or knee replacement, or surgery on parts of the nervous system or on the abdomen. ? A stroke. ? A blood clotting disease. ? Long-term (chronic) lung or heart disease. ? Cancer, especially if you are being treated with chemotherapy. ? A central venous catheter.  Taking medicines that contain estrogen. These include birth control pills and hormone replacement therapy.  Being: ? Pregnant. ? In the period of time after your baby is  delivered (postpartum). ? Older than age 105. ? Overweight. ? A smoker, especially if you have other risks. ? Not very active (sedentary), not being able to move at all, or spending long periods sitting, such as travel over 6 hours. You are also at a greater risk if you have a leg in a cast or splint. What are the signs or symptoms? Symptoms of this condition usually start suddenly and include:  Shortness of breath during activity or at rest.  Coughing, coughing up blood, or coughing up bloody mucus.  Chest pain, back pain, or shoulder blade pain that gets worse with deep breaths.  Rapid or irregular heartbeat.  Feeling light-headed or dizzy, or fainting.  Feeling anxious.  Pain and swelling in a leg. This is a symptom of DVT, which can lead to PE. How is this diagnosed? This condition may be diagnosed based on your medical history, a physical exam, and tests. Tests may include:  Blood tests.  An ECG (electrocardiogram) of the heart.  A CT pulmonary angiogram. This test checks blood flow in and around your lungs.  A ventilation-perfusion scan, also called a lung VQ scan. This test measures air flow and blood flow to the lungs.  An ultrasound to check for a DVT. How is this treated? Treatment for this condition depends on many factors, such as the cause of your PE, your risk for bleeding or developing more clots, and other medical conditions you may have. Treatment aims to stop blood clots from forming or growing larger. In some cases, treatment may be aimed at breaking apart or removing the blood clot. Treatment may include:  Medicines, such as: ? Blood thinning medicines, also called anticoagulants, to stop clots from forming and growing. ? Medicines that break apart clots (thrombolytics).  Procedures, such as: ? Using a flexible tube to remove a blood clot (embolectomy) or to deliver medicine to destroy it (catheter-directed thrombolysis). ? Surgery to remove the clot  (surgical embolectomy). This is rare. You may need a combination of immediate, long-term, and extended treatments. Your treatment may continue for several months (maintenance therapy) or longer depending on your medical conditions. You and your health care provider will work together to choose the treatment program that is best for you. Follow these instructions at home: Medicines  Take over-the-counter and prescription medicines only as told by your health care provider.  If you are taking blood thinners: ? Talk with your health care provider before you take any medicines that contain aspirin or NSAIDs, such as ibuprofen. These medicines increase your risk for dangerous bleeding. ? Take your medicine exactly as told, at the same time every day. ? Avoid activities that could cause injury or bruising, and follow instructions about how to prevent falls. ? Wear a medical alert bracelet or carry a card that lists what medicines you take.  Understand what foods and drugs interact with any medicines that you are taking. General instructions  Ask your health care provider  when you may return to your normal activities. Avoid sitting or lying for a long time without moving.  Maintain a healthy weight. Ask your health care provider what weight is healthy for you.  Do not use any products that contain nicotine or tobacco, such as cigarettes, e-cigarettes, and chewing tobacco. If you need help quitting, ask your health care provider.  Talk with your health care provider about any travel plans. It is important to make sure that you are still able to take your medicine while traveling.  Keep all follow-up visits as told by your health care provider. This is important. Where to find more information  American Lung Association: www.lung.org  Centers for Disease Control and Prevention: http://www.wolf.info/ Contact a health care provider if:  You missed a dose of your blood thinner medicine. Get help right  away if you:  Have: ? New or increased pain, swelling, warmth, or redness in an arm or leg. ? Shortness of breath that gets worse during activity or at rest. ? A fever. ? Worsening chest pain. ? A rapid or irregular heartbeat. ? A severe headache. ? Vision changes. ? A serious fall or accident, or you hit your head. ? Stomach pain. ? Blood in your vomit, stool, or urine. ? A cut that will not stop bleeding.  Cough up blood.  Feel light-headed or dizzy, and that feeling does not go away.  Cannot move your arms or legs.  Are confused or have memory loss. These symptoms may represent a serious problem that is an emergency. Do not wait to see if the symptoms will go away. Get medical help right away. Call your local emergency services (911 in the [place] Do not drive yourself to the hospital. Summary  A pulmonary embolism (PE) is a serious and potentially life-threatening condition, in which a blood clot from one part of the body (deep vein thrombosis, DVT) travels to the arteries of the lung, causing a sudden blockage or decrease of blood flow to the lungs. This may result in shortness of breath, chest pain, dizziness, and fainting.  Treatments for this condition usually include medicines to thin your blood (anticoagulants) or medicines to break apart blood clots (thrombolytics).  If you are given blood thinners, take your medicine exactly as told by your health care provider, at the same time every day. This is important.  Understand what foods and drugs interact with any medicines that you are taking.  If you have signs of PE or DVT, call your local emergency services (911 in the [place] This information is not intended to replace advice given to you by your health care provider. Make sure you discuss any questions you have with your health care provider. Document Revised: 10/15/2019 Document Reviewed: 10/15/2019 Elsevier Patient Education  2021 [employer].

## 2021-03-18 NOTE — Plan of Care (Signed)
  Problem: Education: Goal: Knowledge of General Education information will improve Description: Including pain rating scale, medication(s)/side effects and non-pharmacologic comfort measures Outcome: Progressing   Problem: Clinical Measurements: Goal: Diagnostic test results will improve Outcome: Progressing   Problem: Coping: Goal: Level of anxiety will decrease Outcome: Progressing   

## 2021-03-19 ENCOUNTER — Inpatient Hospital Stay: Payer: Medicare Other | Attending: Oncology | Admitting: Nurse Practitioner

## 2021-03-19 ENCOUNTER — Telehealth: Payer: Self-pay

## 2021-03-19 ENCOUNTER — Other Ambulatory Visit: Payer: Self-pay

## 2021-03-19 ENCOUNTER — Encounter: Payer: Self-pay | Admitting: Nurse Practitioner

## 2021-03-19 VITALS — BP 165/43 | HR 57 | Temp 97.8°F | Resp 18 | Ht 64.0 in | Wt 161.6 lb

## 2021-03-19 DIAGNOSIS — Z8542 Personal history of malignant neoplasm of other parts of uterus: Secondary | ICD-10-CM | POA: Diagnosis not present

## 2021-03-19 DIAGNOSIS — I119 Hypertensive heart disease without heart failure: Secondary | ICD-10-CM | POA: Diagnosis not present

## 2021-03-19 DIAGNOSIS — Z800 Family history of malignant neoplasm of digestive organs: Secondary | ICD-10-CM | POA: Insufficient documentation

## 2021-03-19 DIAGNOSIS — C159 Malignant neoplasm of esophagus, unspecified: Secondary | ICD-10-CM | POA: Diagnosis not present

## 2021-03-19 DIAGNOSIS — Z86711 Personal history of pulmonary embolism: Secondary | ICD-10-CM | POA: Insufficient documentation

## 2021-03-19 DIAGNOSIS — Z79899 Other long term (current) drug therapy: Secondary | ICD-10-CM | POA: Insufficient documentation

## 2021-03-19 DIAGNOSIS — E119 Type 2 diabetes mellitus without complications: Secondary | ICD-10-CM | POA: Diagnosis not present

## 2021-03-19 DIAGNOSIS — Z801 Family history of malignant neoplasm of trachea, bronchus and lung: Secondary | ICD-10-CM | POA: Insufficient documentation

## 2021-03-19 DIAGNOSIS — Z7984 Long term (current) use of oral hypoglycemic drugs: Secondary | ICD-10-CM | POA: Insufficient documentation

## 2021-03-19 DIAGNOSIS — Z90710 Acquired absence of both cervix and uterus: Secondary | ICD-10-CM | POA: Insufficient documentation

## 2021-03-19 DIAGNOSIS — C154 Malignant neoplasm of middle third of esophagus: Secondary | ICD-10-CM

## 2021-03-19 DIAGNOSIS — Z7901 Long term (current) use of anticoagulants: Secondary | ICD-10-CM | POA: Insufficient documentation

## 2021-03-19 NOTE — Telephone Encounter (Signed)
KLB, Thank you for your help last week with this patient results.  I see that she was hospitalized and has been discharged now.  She has an upcoming clinic visit with oncology.  We will see what occurs after there discussion together. GM

## 2021-03-19 NOTE — Progress Notes (Addendum)
New Hematology/Oncology Consult   Requesting Sherry Holloway:  [phone]  Reason for Consult: Esophagus Holloway  HPI: Sherry Holloway is a 87 year old woman who had a CT scan of the abdomen on 01/25/2021 due to history of a pancreatic mass.  The CT showed gross intra and extrahepatic biliary ductal dilatation, the central common bile duct appeared to contain a poorly calcified calculus or soft tissue nodule measuring approximately 1.9 cm.  There was diffuse atrophy of the pancreatic parenchyma and dilatation of the main pancreatic duct.  There was a hypodense mass or gross pancreatic ductal dilatation within the pancreatic head measuring 3.5 x 2.0 x 2.0 cm.  Nodular appearing consolidation of the dependent left lung base measuring 2.4 x 1.8 cm, nonspecific.  She underwent an ERCP by Dr. Rush Landmark on 03/05/2021.  The major papilla was located entirely within a pantaloon diverticulum.  Prior biliary sphincterotomy appeared open.  Pancreatic duct fishmouth deformity noted.  Filling defects consistent with stones and sludge were seen on the cholangiogram.  The entire main bile duct was severely dilated.  Choledocholithiasis was found.  Biliary sphincteroplasty performed.  A stone remained.  Electrohydraulic lithotripsy was performed successfully.  Upper EUS 03/05/2021 showed a small submucosal ulcerating nodule with no bleeding and no stigmata of recent bleeding in the midesophagus, 29 to 31 cm from the incisors.  Lesion nonobstructing and not circumferential.  Patchy mildly erythematous mucosa without bleeding was found in the gastric body and gastric antrum.  The esophagus lesion was staged T1sm N0MX.  Pancreatic parenchymal abnormalities consisting of atrophy were noted in the entire pancreas.  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  There was dilation of the  common bile duct.  No malignant appearing lymph nodes were visualized in the middle paraesophageal mediastinum, lower paraesophageal mediastinum, celiac region, peripancreatic region and porta hepatis region.  Esophagus biopsy showed invasive carcinoma.  Stomach biopsy showed reactive gastropathy.  She had a chest CT and pelvic CT on 03/16/2021.  She was found to have a right lower lobe pulmonary embolus.  She was subsequently admitted and started on anticoagulation.  Lower extremity Doppler studies negative for DVT.  She was discharged home on Xarelto.  On the pelvic CT there was question of a partial small bowel obstruction.  Other than constipation which her daughter reports is a chronic issue she had no other GI symptoms     Past Medical History:  Diagnosis Date  . Arthritis   . Chicken pox   . Diabetes mellitus without complication ([place])   . Diverticulitis   . Esophageal Holloway ([place]) dx'd 2020  . GERD (gastroesophageal reflux disease)   . Hyperlipidemia   . Hypertension   . OSA (obstructive sleep apnea)   . Urine incontinence   . Uterine Holloway [facility])   :   Past Surgical History:  Procedure Laterality Date  . ABDOMINAL HYSTERECTOMY  1972  . APPENDECTOMY    . BILIARY DILATION  03/05/2021   Procedure: BILIARY DILATION;  Surgeon: Rush Landmark Telford Nab., Sherry Holloway;  Location: [facility];  Service: Gastroenterology;;  . BIOPSY  03/05/2021   Procedure: BIOPSY;  Surgeon: Irving Copas., Sherry Holloway;  Location: [facility];  Service: Gastroenterology;;  . Lorin Mercy    . ENDOSCOPIC RETROGRADE CHOLANGIOPANCREATOGRAPHY (ERCP) WITH PROPOFOL N/A 03/05/2021   Procedure: ENDOSCOPIC RETROGRADE CHOLANGIOPANCREATOGRAPHY (ERCP) WITH PROPOFOL;  Surgeon: Rush Landmark Telford Nab., Sherry Holloway;  Location: [facility];  Service: Gastroenterology;  Laterality: N/A;  . ESOPHAGOGASTRODUODENOSCOPY (EGD) WITH PROPOFOL N/A 03/05/2021   Procedure: ESOPHAGOGASTRODUODENOSCOPY (EGD) WITH PROPOFOL;  Surgeon: Rush Landmark  Telford Nab., Sherry Holloway;  Location: [facility];  Service: Gastroenterology;  Laterality: N/A;  . EUS N/A 03/05/2021   Procedure: UPPER ENDOSCOPIC ULTRASOUND (EUS) RADIAL;  Surgeon: Rush Landmark Telford Nab., Sherry Holloway;  Location: [facility];  Service: Gastroenterology;  Laterality: N/A;  . LUMBAR LAMINECTOMY    . REMOVAL OF STONES  03/05/2021   Procedure: REMOVAL OF STONES;  Surgeon: Rush Landmark Telford Nab., Sherry Holloway;  Location: [facility];  Service: Gastroenterology;;  . SPINAL CORD STIMULATOR IMPLANT    . SPYGLASS CHOLANGIOSCOPY N/A 03/05/2021   Procedure: SPYGLASS CHOLANGIOSCOPY;  Surgeon: Irving Copas., Sherry Holloway;  Location: [facility];  Service: Gastroenterology;  Laterality: N/A;  Bess Kinds LITHOTRIPSY N/A 03/05/2021   Procedure: JKDTOIZT LITHOTRIPSY;  Surgeon: Irving Copas., Sherry Holloway;  Location: [facility];  Service: Gastroenterology;  Laterality: N/A;  . TONSILLECTOMY    . TOTAL KNEE ARTHROPLASTY Right   :   Current Outpatient Medications:  .  acetaminophen (TYLENOL) 325 MG tablet, Take 352-650 mg by mouth every 6 (six) hours as needed for mild pain or moderate pain., Disp: , Rfl:  .  bumetanide (BUMEX) 1 MG tablet, Take 1 tablet (1 mg total) by mouth daily. Take 1/2 to 1 tablet daily for swelling (Patient taking differently: Take 0.5 mg by mouth daily. for swelling), Disp: 90 tablet, Rfl: 1 .  docusate sodium (COLACE) 100 MG capsule, Take 1 capsule (100 mg total) by mouth 2 (two) times daily., Disp: 10 capsule, Rfl: 0 .  famciclovir (FAMVIR) 125 MG tablet, Take 1 tablet (125 mg total) by mouth daily., Disp: 90 tablet, Rfl: 3 .  fluorouracil (EFUDEX) 5 % cream, Apply topically 2 (two) times daily. (Patient taking differently: Apply 1 application topically daily. On foot), Disp: 40 g, Rfl: 0 .  glimepiride (AMARYL) 2 MG tablet, Take 2 mg by mouth daily with supper., Disp: , Rfl:  .  glimepiride (AMARYL) 4 MG tablet, Take 1 tablet (4 mg total) by mouth daily before breakfast., Disp: 90 tablet,  Rfl: 1 .  glucose blood test strip, Check blood sugars twice daily, Disp: 200 each, Rfl: 12 .  latanoprost (XALATAN) 0.005 % ophthalmic solution, Place 1 drop into both eyes at bedtime., Disp: , Rfl:  .  lipase/protease/amylase (CREON) 36000 UNITS CPEP capsule, Take 1 capsule (36,000 Units total) by mouth 3 (three) times daily before meals. (Patient taking differently: Take 36,000 Units by mouth 2 (two) times daily.), Disp: 270 capsule, Rfl: 1 .  mirtazapine (REMERON) 15 MG tablet, Take 1 tablet (15 mg total) by mouth at bedtime. (Patient taking differently: Take 15 mg by mouth daily with supper.), Disp: 30 tablet, Rfl: 0 .  morphine (MS CONTIN) 30 MG 12 hr tablet, Take 30 mg by mouth every 12 (twelve) hours., Disp: , Rfl:  .  Multiple Vitamins-Minerals (WOMENS 50+ MULTI VITAMIN/MIN PO), Take 1 tablet by mouth daily with supper., Disp: , Rfl:  .  omeprazole (PRILOSEC) 20 MG capsule, Take 1 capsule (20 mg total) by mouth 2 (two) times daily before a meal., Disp: 180 capsule, Rfl: 3 .  [START ON 04/07/2021] rivaroxaban (XARELTO) 20 MG TABS tablet, Take 1 tablet (20 mg total) by mouth daily with supper. Please start only after the starter pack has been completed., Disp: 30 tablet, Rfl: 2 .  RIVAROXABAN (XARELTO) VTE STARTER PACK (15 & 20 MG), Follow package directions: Take one 15mg  tablet by  mouth twice a day. On day 22, switch to one 20mg  tablet once a day. Take with food., Disp: 51 each, Rfl: 0 .  valsartan (DIOVAN) 40 MG tablet, Take 20 mg by mouth daily with breakfast., Disp: , Rfl: :  :   Allergies  Allergen Reactions  . Gramicidin Other (See Comments)    unknown  . Sulfa Antibiotics Rash  . Wound Dressing Adhesive Rash  :  FH: Sister with lung Holloway, 2 sisters with "Holloway", son deceased with lung Holloway, son with liver Holloway  SOCIAL HISTORY: She relocated to [place] from [place] about 5 months ago.  She is a widow.  Her daughter lives in [place].  Prior alcohol and tobacco  use.  Review of Systems: She in general feels well.  No anorexia or weight loss.  No fevers or sweats.  She denies dysphagia.  No nausea.  No cough or shortness of breath.  No chest pain.  She reports chronic issues with constipation. No urinary symptoms.  No neuropathy symptoms.  She has chronic back pain.  She takes long-acting morphine twice daily.  No new areas of pain.  Physical Exam:  Blood pressure (!) 165/43, pulse (!) 57, temperature 97.8 F (36.6 C), temperature source Tympanic, resp. rate 18, height 5\' 4"  (1.626 m), weight 161 lb 9.6 oz (73.3 kg), SpO2 99 %.  HEENT: No lesions within the oral cavity. Lungs: Lungs clear bilaterally. Cardiac: Regular rate and rhythm. Abdomen: Abdomen soft and nontender.  No hepatomegaly.  Vascular: No leg edema. Lymph nodes: No palpable cervical, supraclavicular or inguinal lymph nodes.  Shotty bilateral cervical nodes. Neurologic: Alert and oriented.  Follows commands. Skin: Warm and dry.  LABS:   Recent Labs    03/17/21 0001 03/18/21 0332  WBC 7.3 7.6  HGB 10.9* 11.6*  HCT 35.4* 37.7  PLT 174 190     Recent Labs    03/17/21 0001 03/18/21 0332  NA 137 138  K 4.1 4.3  CL 106 105  CO2 25 27  GLUCOSE 180* 161*  BUN 16 20  CREATININE 0.88 0.80  CALCIUM 8.9 9.2      RADIOLOGY:  CT CHEST W CONTRAST  Addendum Date: 03/16/2021   ADDENDUM REPORT: 03/16/2021 11:13 ADDENDUM: These results were called by telephone at the time of interpretation on 03/16/2021 at 11:12 am to provider Koren Shiver (RN) , who verbally acknowledged these results. Dr. Rush Landmark was not availabile. Sherry Holloway she would relay findings to the "doctor of the day". Electronically Signed   By: Zetta Bills M.D.   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 EXAM: CT  CHEST WITH CONTRAST TECHNIQUE: Multidetector CT imaging of the chest was performed during intravenous contrast administration. CONTRAST:  63mL OMNIPAQUE IOHEXOL 300 MG/ML  SOLN COMPARISON:  Abdominal CT from February of 2022 FINDINGS: Cardiovascular: Calcified and noncalcified atheromatous plaque in the thoracic aorta. Normal heart size. No pericardial effusion. The three-vessel coronary artery disease. Central pulmonary vessels are normal caliber but there is evidence of nonocclusive RIGHT lower embolus extending into segmental branches of the RIGHT lower lobe. Borderline are V to LV ratio. Mild straightening of the intraventricular septum. Mediastinum/Nodes: Thoracic inlet structures are normal. No axillary or thoracic inlet lymphadenopathy. No subpectoral adenopathy. No mediastinal adenopathy. Lungs/Pleura: RIGHT lower lobe nodule (image 112, series 4) 1.2 cm  previously 1.4 cm though motion limited measurement assessment on the previous study. Scarring in the RIGHT middle lobe. No effusion. Nodular area at the LEFT lung base with bandlike changes that extend from this area on the current study and on the prior study. This measures approximately 1.4 x 1.2 cm, previously 2.1 x 1.9 cm. Other scattered subpleural nodules in the 4-5 mm range. The mild bronchiectasis in the lingula. Airways are patent. Upper Abdomen: Persistent marked biliary duct distension. Pancreas not imaged. The spleen unremarkable. Adrenal glands are normal. Limited imaging of gastrointestinal tract is unremarkable. Musculoskeletal: No acute musculoskeletal process. Spinal degenerative changes. No destructive bone finding. Spinal nerve stimulator in place. IMPRESSION: 1. Study is positive for nonocclusive RIGHT lower lobe pulmonary embolus extending into segmental branches of the RIGHT lower lobe. Borderline are V to LV ratio and mild straightening of the intraventricular septum. Correlate with any clinical signs of right heart strain. May  consider echocardiography for further assessment. 2. Nodular area at the LEFT lung base with bandlike changes that extend from this area on the current study and on the prior study. This measures approximately 1.4 x 1.2 cm, previously 2.1 x 1.9 cm. This may represent a combination of scarring and atelectasis. This may represent a post inflammatory process given decrease in size. Consider short interval follow-up for further evaluation. 3. Suggestion of slight interval decrease in size as well of a RIGHT lower lobe process with nodular features. Respiratory motion on the prior study did limit assessment. Short interval follow-up may be helpful. Could consider PET imaging if there is high risk for metastatic disease. 4. Persistent marked biliary duct distension. 5. Aortic atherosclerosis. A call is out to the referring provider to further discuss findings in the above case. Aortic Atherosclerosis (ICD10-I70.0). Electronically Signed: By: Zetta Bills M.D. On: 03/16/2021 11:01   CT PELVIS W CONTRAST  Result Date: 03/16/2021 CLINICAL DATA:  Esophageal Holloway. EXAM: CT PELVIS WITH CONTRAST TECHNIQUE: Multidetector CT imaging of the pelvis was performed using the standard protocol following the bolus administration of intravenous contrast. CONTRAST:  1mL OMNIPAQUE IOHEXOL 300 MG/ML  SOLN COMPARISON:  Abdominal CT of the same date. FINDINGS: Urinary Tract: Urinary bladder descends to the pelvic floor. No gross abnormality aside from findings that are suggestive of pelvic floor dysfunction. No distal ureteral dilation. Bowel: Colonic diverticulosis. Diverticular disease of the sigmoid. Abundant stool throughout the colon, visualized portions of the colon as seen on recent abdominal CT. Colon incompletely imaged. Mild small bowel distension over the RIGHT iliac crest. Tethering of the small bowel in this area is suggested. This does not appear to cause complete obstruction based on the pattern of ingested contrast  media though distal small bowel loops are potentially collapsed. Bowel distension up to 2.7 cm in the RIGHT lower quadrant with collapse of bowel loops, relative decompression of bowel loops in central abdomen and RIGHT lower pelvis. These may even be proximal to the level of dilation it is difficult to determine on the current evaluation. Vascular/Lymphatic: Calcified atheromatous plaque of the abdominal aorta distally passing into the iliac vessels. Reproductive:  Post hysterectomy. Other:  No ascites.  Small fat containing umbilical hernia. Musculoskeletal: Spinal degenerative changes and evidence of L5 laminectomy. LEFT hip arthroplasty with resultant streak artifact in the pelvis. RIGHT lower quadrant power pack for spinal nerve stimulation overlies the area immediately adjacent to bowel transition, streak artifact does limit assessment for subtle TS in this location. No acute bone finding.  No destructive bone process. IMPRESSION:  1. Mild small bowel distension over the RIGHT iliac crest. Tethering of the small bowel in this area is suggested perhaps due to adhesions. Partial small bowel obstruction is considered based on pattern, correlate with any abdominal symptoms. 2. Abundant stool throughout the colon, visualized portions of the colon as seen on recent abdominal CT. 3. Colonic diverticulosis without evidence of acute diverticulitis. Signs of pelvic floor dysfunction 4. Small fat containing umbilical hernia. 5. Aortic atherosclerosis. These results were called by telephone at the time of interpretation on 03/16/2021 at 11:12 am to provider Koren Shiver (RN) , who verbally acknowledged these results. Dr. Rush Landmark was not availabile. Sherry Holloway she would relay findings to the "doctor of the day". Aortic Atherosclerosis (ICD10-I70.0). Electronically Signed   By: Zetta Bills M.D.   On: 03/16/2021 11:15   US Venous Img Lower Bilateral  Result Date: 03/16/2021 CLINICAL DATA:  Recent PE, lower  extremity swelling EXAM: BILATERAL LOWER EXTREMITY VENOUS DOPPLER ULTRASOUND TECHNIQUE: Gray-scale sonography with graded compression, as well as color Doppler and duplex ultrasound were performed to evaluate the lower extremity deep venous systems from the level of the common femoral vein and including the common femoral, femoral, profunda femoral, popliteal and calf veins including the posterior tibial, peroneal and gastrocnemius veins when visible. The superficial great saphenous vein was also interrogated. Spectral Doppler was utilized to evaluate flow at rest and with distal augmentation maneuvers in the common femoral, femoral and popliteal veins. COMPARISON:  None. FINDINGS: RIGHT LOWER EXTREMITY Common Femoral Vein: No evidence of thrombus. Normal compressibility, respiratory phasicity and response to augmentation. Saphenofemoral Junction: No evidence of thrombus. Normal compressibility and flow on color Doppler imaging. Profunda Femoral Vein: No evidence of thrombus. Normal compressibility and flow on color Doppler imaging. Femoral Vein: No evidence of thrombus. Normal compressibility, respiratory phasicity and response to augmentation. Popliteal Vein: No evidence of thrombus. Normal compressibility, respiratory phasicity and response to augmentation. Calf Veins: Limited assessment of the calf veins. Tibial veins appear patent. Peroneal veins not visualized. No gross thrombus appreciated. Superficial Great Saphenous Vein: No evidence of thrombus. Normal compressibility. Other Findings:  Peripheral subcutaneous edema noted. LEFT LOWER EXTREMITY Common Femoral Vein: No evidence of thrombus. Normal compressibility, respiratory phasicity and response to augmentation. Saphenofemoral Junction: No evidence of thrombus. Normal compressibility and flow on color Doppler imaging. Profunda Femoral Vein: No evidence of thrombus. Normal compressibility and flow on color Doppler imaging. Femoral Vein: No evidence of  thrombus. Normal compressibility, respiratory phasicity and response to augmentation. Popliteal Vein: No evidence of thrombus. Normal compressibility, respiratory phasicity and response to augmentation. Calf Veins: Limited assessment of the tibial and peroneal calf veins. No gross thrombus appreciated. Superficial Great Saphenous Vein: No evidence of thrombus. Normal compressibility. Other Findings: Medial popliteal fossa complex thick-walled Baker's cyst measures 5.7 x 1.3 x 2.1 cm IMPRESSION: No significant femoropopliteal DVT. Limited assessment of the calf veins. Peripheral edema noted. 5.7 cm left popliteal fossa Baker's cyst. Electronically Signed   By: Jerilynn Mages.  Shick M.D.   On: 03/16/2021 17:19   DG ERCP  Result Date: 03/05/2021 CLINICAL DATA:  87 year old female with history of pancreatic mass and biliary ductal dilation. EXAM: ERCP TECHNIQUE: Multiple spot images obtained with the fluoroscopic device and submitted for interpretation post-procedure. FLUOROSCOPY TIME:  Fluoroscopy Time:  8 minutes, 55 seconds Number of Acquired Spot Images: 16 COMPARISON:  CT abdomen pelvis from 01/25/2021 FINDINGS: Duodenal scope is positioned in the second portion the duodenum with apparent retrograde cannulation of the common bile duct. Limited  retrograde cholangiogram demonstrates severe dilation of the common bile duct and moderate intrahepatic biliary ductal dilation. ERCP images are suggestive of balloon sweep and sphincterotomy. IMPRESSION: Severe common bile duct dilation and moderate intrahepatic biliary ductal dilation. Balloon sweep and sphincterotomy were performed. Please refer to the operative note for further procedural details. These images were submitted for radiologic interpretation only. Please see the procedural report for the amount of contrast and the fluoroscopy time utilized. Sherry Cancer, Sherry Holloway [facility] Electronically Signed   By: Sherry Cancer Sherry Holloway   On: 03/05/2021 12:44   ECHOCARDIOGRAM COMPLETE  Result Date: 03/17/2021    ECHOCARDIOGRAM REPORT   Patient Name:   Sherry Holloway Date of Exam: 03/17/2021 Medical Rec #:  [id number]      Height:       64.0 in Accession #:    [id number]     Weight:       161.4 lb Date of Birth:  [birth year]      BSA:          1.786 m Patient Age:    37 years       BP:           136/57 mmHg Patient Gender: F              HR:           60 bpm. Exam Location:  Inpatient Procedure: 2D Echo, Cardiac Doppler and Color Doppler Indications:    I26.02 Pulmonary embolus  History:        Patient has no prior history of Echocardiogram examinations.                 Risk Factors:Hypertension, Diabetes, Dyslipidemia, GERD and                 Sleep Apnea. Holloway.  Sonographer:    Sherry Holloway Referring Phys: [id number] [place]  1. Left ventricular ejection Holloway, by estimation, is 60 to 65%. The left ventricle has normal function. The left ventricle has no regional wall motion abnormalities. There is mild left ventricular hypertrophy. Left ventricular diastolic parameters are consistent with Grade I diastolic dysfunction (impaired relaxation).  2. Right ventricular systolic function is normal. The right ventricular size is normal.  3. The mitral valve is normal in structure. No evidence of mitral valve regurgitation. No evidence of mitral stenosis.  4. The aortic valve is tricuspid. Aortic valve regurgitation is not visualized. Mild aortic valve sclerosis is present, with no evidence of aortic valve stenosis.  5. The inferior vena cava is normal in size with greater than 50% respiratory variability, suggesting right atrial pressure of 3 mmHg. FINDINGS  Left Ventricle: Left ventricular ejection Holloway, by estimation, is 60 to 65%. The left ventricle has normal function. The left ventricle has no regional wall motion abnormalities. The left ventricular internal cavity size was normal in size. There is  mild left  ventricular hypertrophy. Left ventricular diastolic parameters are consistent with Grade I diastolic dysfunction (impaired relaxation). Right Ventricle: The right ventricular size is normal. No increase in right ventricular wall thickness. Right ventricular systolic function is normal. Left Atrium: Left atrial size was normal in size. Right Atrium: Right atrial size was normal in size. Pericardium: There is no evidence of pericardial effusion. Mitral Valve: The mitral valve is normal in structure. Mild mitral annular calcification. No evidence of mitral valve regurgitation. No evidence of mitral valve stenosis. Tricuspid Valve: The tricuspid valve is normal  in structure. Tricuspid valve regurgitation is trivial. No evidence of tricuspid stenosis. Aortic Valve: The aortic valve is tricuspid. Aortic valve regurgitation is not visualized. Mild aortic valve sclerosis is present, with no evidence of aortic valve stenosis. Pulmonic Valve: The pulmonic valve was normal in structure. Pulmonic valve regurgitation is not visualized. No evidence of pulmonic stenosis. Aorta: The aortic root is normal in size and structure. Venous: The inferior vena cava is normal in size with greater than 50% respiratory variability, suggesting right atrial pressure of 3 mmHg. IAS/Shunts: No atrial level shunt detected by color flow Doppler.  LEFT VENTRICLE PLAX 2D LVIDd:         4.20 cm  Diastology LVIDs:         2.70 cm  LV e' medial:    4.46 cm/s LV PW:         1.40 cm  LV E/e' medial:  22.2 LV IVS:        1.20 cm  LV e' lateral:   6.96 cm/s LVOT diam:     2.10 cm  LV E/e' lateral: 14.2 LV SV:         94 LV SV Index:   53 LVOT Area:     3.46 cm  RIGHT VENTRICLE             IVC RV Basal diam:  2.50 cm     IVC diam: 1.80 cm RV S prime:     12.80 cm/s TAPSE (M-mode): 2.0 cm LEFT ATRIUM             Index       RIGHT ATRIUM           Index LA diam:        3.90 cm 2.18 cm/m  RA Area:     11.70 cm LA Vol (A2C):   61.3 ml 34.32 ml/m RA Volume:    22.30 ml  12.49 ml/m LA Vol (A4C):   52.2 ml 29.23 ml/m LA Biplane Vol: 59.7 ml 33.43 ml/m  AORTIC VALVE LVOT Vmax:   110.00 cm/s LVOT Vmean:  74.600 cm/s LVOT VTI:    0.272 m  AORTA Ao Root diam: 3.20 cm Ao Asc diam:  3.20 cm MITRAL VALVE MV Area (PHT): 2.29 cm     SHUNTS MV Decel Time: 331 msec     Systemic VTI:  0.27 m MV E velocity: 98.80 cm/s   Systemic Diam: 2.10 cm MV A velocity: 122.00 cm/s MV E/A ratio:  0.81 Sherry Furbish Sherry Holloway Electronically signed by Sherry [place] Sherry Holloway Signature Date/Time: 03/17/2021/1:49:21 PM    Final     Assessment and Plan:   1. Esophagus Holloway   Upper EUS 03/05/2021-small submucosal ulcerating nodule with no bleeding and no stigmata of recent bleeding in the midesophagus, 29 to 31 cm from the incisors.  Lesion nonobstructing and not circumferential.  Patchy mildly erythematous mucosa without bleeding was found in the gastric body and gastric antrum.  The esophagus lesion was staged T1sm N0 MX.  Pancreatic parenchymal abnormalities consisting of atrophy were noted in the entire pancreas.  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  There was dilation of the common bile duct.  No malignant appearing lymph nodes were visualized in the middle paraesophageal mediastinum, lower paraesophageal mediastinum, celiac region, peripancreatic region and porta hepatis region.  Esophagus biopsy showed invasive carcinoma.  Stomach biopsy showed reactive gastropathy; no intestinal metaplasia, dysplasia or malignancy.  CT chest and pelvis 03/16/2021-nonocclusive right lower lobe pulmonary embolus extending into segmental branches of the right lower lobe.  Nodular area at the left lung base with bandlike changes that extend from this area on the current study and on the prior study.  This may represent a combination of scarring and atelectasis.   Measures 1.4 x 1.2 cm, previously 2.1 x 1.9 cm.  Suggestion of slight interval decrease in size as well of a right lower lobe process with nodular features.  Persistent marked biliary duct distention. 2. CT scan of the abdomen on 01/25/2021 due to history of a pancreatic mass.  The CT showed gross intra and extrahepatic biliary ductal dilatation, the central common bile duct appeared to contain a poorly calcified calculus or soft tissue nodule measuring approximately 1.9 cm.  There was diffuse atrophy of the pancreatic parenchyma and dilatation of the main pancreatic duct.  There was a hypodense mass or gross pancreatic ductal dilatation within the pancreatic head measuring 3.5 x 2.0 x 2.0 cm.  Nodular appearing consolidation of the dependent left lung base measuring 2.4 x 1.8 cm, nonspecific. 3. ERCP by Dr. Rush Landmark on 03/05/2021.  The major papilla was located entirely within a pantaloon diverticulum.  Prior biliary sphincterotomy appeared open.  Pancreatic duct fishmouth deformity noted.  Filling defects consistent with stones and sludge were seen on the cholangiogram.  The entire main bile duct was severely dilated.  Choledocholithiasis was found.  Biliary sphincteroplasty performed.  Electrohydraulic lithotripsy of a remaining stone performed.   4. Pulmonary embolus on chest CT 03/15/2021-on Xarelto.  Bilateral lower extremity venous Doppler study with no significant femoral-popliteal DVT.  Limited assessment of the calf veins.  5.7 cm left popliteal fossa Baker's cyst. 5. GERD 6. Hypertension 7. Diabetes 8. OSA 9. Subclavian arterial stenosis 10. History of uterine Holloway 11. Chronic back pain maintained on MS Contin, followed by Dr. Nelva Bush  Ms. Chahal appears to have an early stage esophagus Holloway.  Dr. Benay Spice reviewed the diagnosis, prognosis and treatment options with her and her daughter at today's appointment.  CT scan report/images were reviewed.  Her case will be presented at the  upcoming GI tumor conference, question if she is a candidate for endoscopic mucosal resection.  Etiology of the left lung base/right lower lobe changes unclear but improved as compared to the previous study.  These findings will be reviewed at the GI tumor conference as well.  She will return for follow-up in 1 month, sooner pending recommendations from the GI tumor conference.  Patient seen with Dr. Benay Spice.   Ned Card, NP 03/19/2021, 1:29 PM   This was a shared visit with Ned Card.  Ms. Kerkman was interviewed and examined.  I reviewed the CT images with her. 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  This was an incidental finding.  She appears asymptomatic.  I discussed the treatment of esophagus Holloway with Ms. Pask and her daughter.  She may be a candidate for an endoscopic resection procedure as opposed to treatment with chemotherapy and radiation.  I do not feel she is a candidate for an esophagectomy based on her age and comorbid conditions.  The lower lung nodular areas are likely related to an inflammatory condition.  I will present her case at the GI tumor conference.  I will discuss the case with Dr. Rush Landmark.  I was present for greater than 50% of today's  visit.  I performed medical decision making.  Sherry Manson, Sherry Holloway

## 2021-03-19 NOTE — Telephone Encounter (Signed)
Transition Care Management Unsuccessful Follow-up Telephone Call  Date of discharge and from where:  03/18/21-[place]  Attempts:  1st Attempt  Reason for unsuccessful TCM follow-up call:  Left voice message

## 2021-03-20 ENCOUNTER — Telehealth: Payer: Self-pay | Admitting: Oncology

## 2021-03-20 NOTE — Telephone Encounter (Signed)
Transition Care Management Follow-up Telephone Call Date of discharge and from where: 03/18/2021-[place] How have you been since you were released from the hospital? Doing just fine & eating well per daughter Any questions or concerns? No  Items Reviewed: Did the pt receive and understand the discharge instructions provided? Yes  Medications obtained and verified? Yes  Other? Yes  Any new allergies since your discharge? No  Dietary orders reviewed? Yes Do you have support at home? Yes   Home Care and Equipment/Supplies: Were home health services ordered? no If so, what is the name of the agency? N/a  Has the agency set up a time to come to the patient's home? not applicable Were any new equipment or medical supplies ordered?  No What is the name of the medical supply agency? N/a Were you able to get the supplies/equipment? not applicable Do you have any questions related to the use of the equipment or supplies? N/a Functional Questionnaire: (I = Independent and D = Dependent) ADLs: I  Bathing/Dressing-I  Meal Prep- I  Eating- I  Maintaining continence- I  Transferring/Ambulation- I  Managing Meds- I  Follow up appointments reviewed:  PCP Hospital f/u appt confirmed? Yes  Scheduled to see Dr. Etter Sjogren on 03/27/2021 @ 1:20. [facility] f/u appt confirmed? Yes  Scheduled to see Dr. Ammie Dalton  on 03/19/21 @ 1:40. Are transportation arrangements needed? No  If their condition worsens, is the pt aware to call PCP or go to the Emergency Dept.? Yes Was the patient provided with contact information for the PCP's office or ED? Yes Was to pt encouraged to call back with questions or concerns? Yes

## 2021-03-20 NOTE — Telephone Encounter (Signed)
Appointments scheduled with patient while in exam room per 4/4 los 

## 2021-03-21 ENCOUNTER — Other Ambulatory Visit: Payer: Self-pay | Admitting: *Deleted

## 2021-03-21 ENCOUNTER — Encounter: Payer: Self-pay | Admitting: Gastroenterology

## 2021-03-21 ENCOUNTER — Other Ambulatory Visit: Payer: Self-pay

## 2021-03-21 DIAGNOSIS — C154 Malignant neoplasm of middle third of esophagus: Secondary | ICD-10-CM

## 2021-03-21 NOTE — Progress Notes (Signed)
Per discussion on tumor board today, PET scan ordered for staging. Notified managed care.

## 2021-03-21 NOTE — Progress Notes (Unsigned)
Case discussed at [facility] on 03/21/2021. Imaging reviewed. Pathology reviewed. I had called and spoken with the patient's daughter about consideration of whether endoscopic approach at resection could be possible. Endoscopic submucosal dissection may be a possibility although when disease is already in the submucosa (as evidenced by the endoscopic ultrasound) potential complete excision may not always be possible. I had previously spoken with advanced endoscopy at [facility] and they would be reasonable to evaluate the patient as long as a PET/CT did not show any other evidence of significant changes. We will move forward with placing a [facility] advanced endoscopy referral (to Dr. Obie Dredge). Oncology will move forward with scheduling a PET CT. We will forward those results to [facility] advanced endoscopy should they be abnormal or have potential clinical implication. If endoscopic resection is not possible then chemo-XRT may be a possibility.  I will have my team (RN Gerarda Fraction) work on placing the advanced endoscopy referral.   Justice Britain, MD [facility] Advanced Endoscopy Office # [phone]

## 2021-03-21 NOTE — Progress Notes (Signed)
The proposed treatment discussed in conference is for discussion purposes only and is not a binding recommendation.  The patients have not been physically examined, or presented with their treatment options.  Therefore, final treatment plans cannot be decided.   

## 2021-03-22 ENCOUNTER — Telehealth: Payer: Self-pay | Admitting: *Deleted

## 2021-03-22 NOTE — Telephone Encounter (Signed)
Notified daughter that at tumor board discussion it was decided that GI will refer her to [facility] for surgical opinion and will need PET scan prior. She understands and agrees and states it was already scheduled for Monday.

## 2021-03-22 NOTE — Progress Notes (Signed)
The referral and records have been faxed to [facility] Dr Harl Bowie.

## 2021-03-27 ENCOUNTER — Ambulatory Visit (INDEPENDENT_AMBULATORY_CARE_PROVIDER_SITE_OTHER): Payer: Medicare Other | Admitting: Podiatry

## 2021-03-27 ENCOUNTER — Other Ambulatory Visit: Payer: Self-pay

## 2021-03-27 ENCOUNTER — Ambulatory Visit (INDEPENDENT_AMBULATORY_CARE_PROVIDER_SITE_OTHER): Payer: Medicare Other | Admitting: Family Medicine

## 2021-03-27 ENCOUNTER — Encounter: Payer: Self-pay | Admitting: Podiatry

## 2021-03-27 VITALS — BP 142/60 | HR 76 | Temp 98.9°F | Resp 18 | Ht 64.0 in | Wt 163.4 lb

## 2021-03-27 DIAGNOSIS — D2371 Other benign neoplasm of skin of right lower limb, including hip: Secondary | ICD-10-CM

## 2021-03-27 DIAGNOSIS — I6529 Occlusion and stenosis of unspecified carotid artery: Secondary | ICD-10-CM

## 2021-03-27 DIAGNOSIS — I10 Essential (primary) hypertension: Secondary | ICD-10-CM

## 2021-03-27 DIAGNOSIS — K8689 Other specified diseases of pancreas: Secondary | ICD-10-CM

## 2021-03-27 DIAGNOSIS — E1165 Type 2 diabetes mellitus with hyperglycemia: Secondary | ICD-10-CM | POA: Diagnosis not present

## 2021-03-27 DIAGNOSIS — I2699 Other pulmonary embolism without acute cor pulmonale: Secondary | ICD-10-CM

## 2021-03-27 NOTE — Progress Notes (Signed)
Subjective:   By signing my name below, I, Sherry Holloway, attest that this documentation has been prepared under the direction and in the presence of Dr. Gabriel Earing    Patient ID: Sherry Holloway, female    DOB: 11-10-1928, 87 y.o.   MRN: [id number]  Chief Complaint  Patient presents with  . Hospitalization Follow-up    03/16/2021: PE- started Xarelto -tlc,rma    HPI  Patient is in today for a hospital follow up. She is accompanied by her daughter. Prior to going to the ER she received a CT scan with her and a pulmonary embolism and small bowel stricture was identified. She was recommended to be seen in the ER on 03/16/2021. She was admitted, and placed on Eliqus but then switched 20 mg of Xarelto to manage her PE. She has no bleeding issues at this time. She was discharged on the 03/19/21 and recommended to be seen here for further evaluation. She also has a PET Scan schedule for 4/18 to determine approval for [facility]'s research study. She has no nausea, vomiting, abdominal pain, or diarrhea. She denies having any shortness of breath, chest pain, tightness, or pressure. She denies having any lower extremity swelling, lightheadedness, or dizziness at this time.    Past Medical History:  Diagnosis Date  . Arthritis   . Chicken pox   . Diabetes mellitus without complication ([place])   . Diverticulitis   . Esophageal cancer ([place]) dx'd 2020  . GERD (gastroesophageal reflux disease)   . Hyperlipidemia   . Hypertension   . OSA (obstructive sleep apnea)   . Urine incontinence   . Uterine cancer [facility])     Past Surgical History:  Procedure Laterality Date  . ABDOMINAL HYSTERECTOMY  1972  . APPENDECTOMY    . BILIARY DILATION  03/05/2021   Procedure: BILIARY DILATION;  Surgeon: Rush Landmark Telford Nab., MD;  Location: [facility];  Service: Gastroenterology;;  . BIOPSY  03/05/2021   Procedure: BIOPSY;  Surgeon: Irving Copas., MD;  Location: [facility];  Service:  Gastroenterology;;  . Lorin Mercy    . ENDOSCOPIC RETROGRADE CHOLANGIOPANCREATOGRAPHY (ERCP) WITH PROPOFOL N/A 03/05/2021   Procedure: ENDOSCOPIC RETROGRADE CHOLANGIOPANCREATOGRAPHY (ERCP) WITH PROPOFOL;  Surgeon: Rush Landmark Telford Nab., MD;  Location: [facility];  Service: Gastroenterology;  Laterality: N/A;  . ESOPHAGOGASTRODUODENOSCOPY (EGD) WITH PROPOFOL N/A 03/05/2021   Procedure: ESOPHAGOGASTRODUODENOSCOPY (EGD) WITH PROPOFOL;  Surgeon: Rush Landmark Telford Nab., MD;  Location: [facility];  Service: Gastroenterology;  Laterality: N/A;  . EUS N/A 03/05/2021   Procedure: UPPER ENDOSCOPIC ULTRASOUND (EUS) RADIAL;  Surgeon: Rush Landmark Telford Nab., MD;  Location: [facility];  Service: Gastroenterology;  Laterality: N/A;  . LUMBAR LAMINECTOMY    . REMOVAL OF STONES  03/05/2021   Procedure: REMOVAL OF STONES;  Surgeon: Rush Landmark Telford Nab., MD;  Location: [facility];  Service: Gastroenterology;;  . SPINAL CORD STIMULATOR IMPLANT    . SPYGLASS CHOLANGIOSCOPY N/A 03/05/2021   Procedure: SPYGLASS CHOLANGIOSCOPY;  Surgeon: Irving Copas., MD;  Location: [facility];  Service: Gastroenterology;  Laterality: N/A;  Bess Kinds LITHOTRIPSY N/A 03/05/2021   Procedure: PYKDXIPJ LITHOTRIPSY;  Surgeon: Irving Copas., MD;  Location: [facility];  Service: Gastroenterology;  Laterality: N/A;  . TONSILLECTOMY    . TOTAL KNEE ARTHROPLASTY Right     Family History  Problem Relation Age of Onset  . Diabetes Mother   . Heart disease Father   . Lung cancer Sister   . Lung cancer Son   . Hypertension Son   .  Hypertension Daughter   . Diabetes Son   . Hypertension Son   . Diabetes Daughter   . Hypertension Daughter     Social History   Socioeconomic History  . Marital status: Widowed    Spouse name: Not on file  . Number of children: 4  . Years of education: Not on file  . Highest education level: Not on file  Occupational History  . Occupation: Retired Quarry manager  Tobacco  Use  . Smoking status: Former Smoker    Types: Cigarettes    Quit date: 01/17/1989    Years since quitting: 32.2  . Smokeless tobacco: Never Used  Vaping Use  . Vaping Use: Never used  Substance and Sexual Activity  . Alcohol use: Not Currently  . Drug use: Never  . Sexual activity: Not Currently  Other Topics Concern  . Not on file  Social History Narrative   Just moved from [place]--- and moved into an apt 2.4 miles from her daughter    Social Determinants of Health   Financial Resource Strain: Not on file  Food Insecurity: Not on file  Transportation Needs: Not on file  Physical Activity: Not on file  Stress: Not on file  Social Connections: Not on file  Intimate Partner Violence: Not on file    Outpatient Medications Prior to Visit  Medication Sig Dispense Refill  . acetaminophen (TYLENOL) 325 MG tablet Take 352-650 mg by mouth every 6 (six) hours as needed for mild pain or moderate pain.    . bumetanide (BUMEX) 1 MG tablet Take 1 tablet (1 mg total) by mouth daily. Take 1/2 to 1 tablet daily for swelling (Patient taking differently: Take 0.5 mg by mouth daily. for swelling) 90 tablet 1  . docusate sodium (COLACE) 100 MG capsule Take 1 capsule (100 mg total) by mouth 2 (two) times daily. 10 capsule 0  . famciclovir (FAMVIR) 125 MG tablet Take 1 tablet (125 mg total) by mouth daily. 90 tablet 3  . glimepiride (AMARYL) 2 MG tablet Take 2 mg by mouth daily with supper.    Marland Kitchen glimepiride (AMARYL) 4 MG tablet Take 1 tablet (4 mg total) by mouth daily before breakfast. 90 tablet 1  . glucose blood test strip Check blood sugars twice daily 200 each 12  . latanoprost (XALATAN) 0.005 % ophthalmic solution Place 1 drop into both eyes at bedtime.    . lipase/protease/amylase (CREON) 36000 UNITS CPEP capsule Take 1 capsule (36,000 Units total) by mouth 3 (three) times daily before meals. (Patient taking differently: Take 36,000 Units by mouth 2 (two) times daily.) 270 capsule 1  . mirtazapine  (REMERON) 15 MG tablet Take 1 tablet (15 mg total) by mouth at bedtime. (Patient taking differently: Take 15 mg by mouth daily with supper.) 30 tablet 0  . morphine (MS CONTIN) 30 MG 12 hr tablet Take 30 mg by mouth every 12 (twelve) hours.    . Multiple Vitamins-Minerals (WOMENS 50+ MULTI VITAMIN/MIN PO) Take 1 tablet by mouth daily with supper.    Marland Kitchen omeprazole (PRILOSEC) 20 MG capsule Take 1 capsule (20 mg total) by mouth 2 (two) times daily before a meal. 180 capsule 3  . [START ON 04/07/2021] rivaroxaban (XARELTO) 20 MG TABS tablet Take 1 tablet (20 mg total) by mouth daily with supper. Please start only after the starter pack has been completed. 30 tablet 2  . RIVAROXABAN (XARELTO) VTE STARTER PACK (15 & 20 MG) Follow package directions: Take one 15mg  tablet by mouth twice a  day. On day 22, switch to one 20mg  tablet once a day. Take with food. 51 each 0  . valsartan (DIOVAN) 40 MG tablet Take 40 mg by mouth daily with breakfast.    . fluorouracil (EFUDEX) 5 % cream Apply topically 2 (two) times daily. (Patient not taking: Reported on 03/27/2021) 40 g 0   No facility-administered medications prior to visit.    Allergies  Allergen Reactions  . Gramicidin Other (See Comments)    unknown  . Sulfa Antibiotics Rash  . Wound Dressing Adhesive Rash    Review of Systems  Constitutional: Negative for chills and fever.  HENT: Negative for ear pain, sinus pain and sore throat.   Eyes: Negative for pain.  Respiratory: Negative for cough and shortness of breath.   Cardiovascular: Negative for chest pain and leg swelling.  Gastrointestinal: Negative for abdominal pain, blood in stool, diarrhea, nausea and vomiting.  Musculoskeletal: Negative for back pain.  Skin: Negative for rash.  Neurological: Negative for dizziness and headaches.       Objective:    Physical Exam Constitutional:      General: She is not in acute distress.    Appearance: Normal appearance. She is not ill-appearing.   HENT:     Head: Normocephalic and atraumatic.     Right Ear: External ear normal.     Left Ear: External ear normal.  Eyes:     Extraocular Movements: Extraocular movements intact.     Pupils: Pupils are equal, round, and reactive to light.  Cardiovascular:     Rate and Rhythm: Normal rate and regular rhythm.     Pulses: Normal pulses.     Heart sounds: Normal heart sounds. No murmur heard.   Pulmonary:     Effort: Pulmonary effort is normal. No respiratory distress.     Breath sounds: Normal breath sounds. No wheezing, rhonchi or rales.  Abdominal:     General: Bowel sounds are normal.     Palpations: Abdomen is soft. There is no mass.     Tenderness: There is no abdominal tenderness.  Musculoskeletal:        General: Normal range of motion.  Skin:    General: Skin is warm and dry.     Findings: No erythema.  Neurological:     Mental Status: She is alert and oriented to person, place, and time.  Psychiatric:        Behavior: Behavior normal.     BP (!) 142/60 (BP Location: Left Arm, Patient Position: Sitting, Cuff Size: Normal)   Pulse 76   Temp 98.9 F (37.2 C) (Oral)   Resp 18   Ht 5\' 4"  (1.626 m)   Wt 163 lb 6.4 oz (74.1 kg)   SpO2 96%   BMI 28.05 kg/m  Wt Readings from Last 3 Encounters:  03/27/21 163 lb 6.4 oz (74.1 kg)  03/19/21 161 lb 9.6 oz (73.3 kg)  03/16/21 161 lb 6.4 oz (73.2 kg)    Diabetic Foot Exam - Simple   No data filed    Lab Results  Component Value Date   WBC 7.6 03/18/2021   HGB 11.6 (L) 03/18/2021   HCT 37.7 03/18/2021   PLT 190 03/18/2021   GLUCOSE 161 (H) 03/18/2021   CHOL 146 02/06/2021   TRIG 179.0 (H) 02/06/2021   HDL 52.70 02/06/2021   LDLCALC 58 02/06/2021   ALT 25 03/16/2021   AST 15 03/16/2021   NA 138 03/18/2021   K 4.3 03/18/2021   CL  105 03/18/2021   CREATININE 0.80 03/18/2021   BUN 20 03/18/2021   CO2 27 03/18/2021   INR 0.9 03/16/2021   HGBA1C 8.1 (H) 02/06/2021   MICROALBUR 3.8 10/19/2020    No  results found for: TSH Lab Results  Component Value Date   WBC 7.6 03/18/2021   HGB 11.6 (L) 03/18/2021   HCT 37.7 03/18/2021   MCV 88.3 03/18/2021   PLT 190 03/18/2021   Lab Results  Component Value Date   NA 138 03/18/2021   K 4.3 03/18/2021   CO2 27 03/18/2021   GLUCOSE 161 (H) 03/18/2021   BUN 20 03/18/2021   CREATININE 0.80 03/18/2021   BILITOT 0.1 (L) 03/16/2021   ALKPHOS 73 03/16/2021   AST 15 03/16/2021   ALT 25 03/16/2021   PROT 7.2 03/16/2021   ALBUMIN 3.9 03/16/2021   CALCIUM 9.2 03/18/2021   ANIONGAP 6 03/18/2021   GFR 58.42 (L) 02/06/2021   Lab Results  Component Value Date   CHOL 146 02/06/2021   Lab Results  Component Value Date   HDL 52.70 02/06/2021   Lab Results  Component Value Date   LDLCALC 58 02/06/2021   Lab Results  Component Value Date   TRIG 179.0 (H) 02/06/2021   Lab Results  Component Value Date   CHOLHDL 3 02/06/2021   Lab Results  Component Value Date   HGBA1C 8.1 (H) 02/06/2021       Assessment & Plan:   Problem List Items Addressed This Visit      Unprioritized   Acute pulmonary embolism ([place])    On xarelto      Hypertension    Well controlled, no changes to meds. Encouraged heart healthy diet such as the DASH diet and exercise as tolerated.       Pancreatic mass    Per gi / oncology Being worked up        Other Visit Diagnoses    Uncontrolled type 2 diabetes mellitus with hyperglycemia ([place])    -  Primary   Relevant Orders   Lipid panel   Hemoglobin A1c   Microalbumin / creatinine urine ratio   Comprehensive metabolic panel      No orders of the defined types were placed in this encounter.   IAnn Held, DO, personally preformed the services described in this documentation.  All medical record entries made by the scribe were at my direction and in my presence.  I have reviewed the chart and discharge instructions (if applicable) and agree that the record reflects my personal performance  and is accurate and complete. 03/27/21  I,Alexis Bryant,acting as a scribe for Ann Held, DO.,have documented all relevant documentation on the behalf of Ann Held, DO,as directed by  Ann Held, DO while in the presence of Ann Held, DO.   Ann Held, DO

## 2021-03-27 NOTE — Patient Instructions (Signed)
Pulmonary Embolism  A pulmonary embolism (PE) is a sudden blockage or decrease of blood flow in one or both lungs that happens when a clot travels into the arteries of the lung (pulmonary arteries). Most blockages come from a blood clot that forms in the vein of a leg or arm (deep vein thrombosis, DVT) and travels to the lungs. A clot is blood that has thickened into a gel or solid. PE is a dangerous and life-threatening condition that needs to be treated right away. What are the causes? This condition is usually caused by a blood clot that forms in a vein and moves to the lungs. In rare cases, it may be caused by air, fat, part of a tumor, or other tissue that moves through the veins and into the lungs. What increases the risk? The following factors may make you more likely to develop this condition:  Experiencing a traumatic injury, such as breaking a hip or leg.  Having: ? A spinal cord injury. ? Major surgery, especially hip or knee replacement, or surgery on parts of the nervous system or on the abdomen. ? A stroke. ? A blood clotting disease. ? Long-term (chronic) lung or heart disease. ? Cancer, especially if you are being treated with chemotherapy. ? A central venous catheter.  Taking medicines that contain estrogen. These include birth control pills and hormone replacement therapy.  Being: ? Pregnant. ? In the period of time after your baby is delivered (postpartum). ? Older than age 60. ? Overweight. ? A smoker, especially if you have other risks. ? Not very active (sedentary), not being able to move at all, or spending long periods sitting, such as travel over 6 hours. You are also at a greater risk if you have a leg in a cast or splint. What are the signs or symptoms? Symptoms of this condition usually start suddenly and include:  Shortness of breath during activity or at rest.  Coughing, coughing up blood, or coughing up bloody mucus.  Chest pain, back pain, or  shoulder blade pain that gets worse with deep breaths.  Rapid or irregular heartbeat.  Feeling light-headed or dizzy, or fainting.  Feeling anxious.  Pain and swelling in a leg. This is a symptom of DVT, which can lead to PE. How is this diagnosed? This condition may be diagnosed based on your medical history, a physical exam, and tests. Tests may include:  Blood tests.  An ECG (electrocardiogram) of the heart.  A CT pulmonary angiogram. This test checks blood flow in and around your lungs.  A ventilation-perfusion scan, also called a lung VQ scan. This test measures air flow and blood flow to the lungs.  An ultrasound to check for a DVT. How is this treated? Treatment for this condition depends on many factors, such as the cause of your PE, your risk for bleeding or developing more clots, and other medical conditions you may have. Treatment aims to stop blood clots from forming or growing larger. In some cases, treatment may be aimed at breaking apart or removing the blood clot. Treatment may include:  Medicines, such as: ? Blood thinning medicines, also called anticoagulants, to stop clots from forming and growing. ? Medicines that break apart clots (thrombolytics).  Procedures, such as: ? Using a flexible tube to remove a blood clot (embolectomy) or to deliver medicine to destroy it (catheter-directed thrombolysis). ? Surgery to remove the clot (surgical embolectomy). This is rare. You may need a combination of immediate, long-term, and extended   treatments. Your treatment may continue for several months (maintenance therapy) or longer depending on your medical conditions. You and your health care provider will work together to choose the treatment program that is best for you. Follow these instructions at home: Medicines  Take over-the-counter and prescription medicines only as told by your health care provider.  If you are taking blood thinners: ? Talk with your health care  provider before you take any medicines that contain aspirin or NSAIDs, such as ibuprofen. These medicines increase your risk for dangerous bleeding. ? Take your medicine exactly as told, at the same time every day. ? Avoid activities that could cause injury or bruising, and follow instructions about how to prevent falls. ? Wear a medical alert bracelet or carry a card that lists what medicines you take.  Understand what foods and drugs interact with any medicines that you are taking. General instructions  Ask your health care provider when you may return to your normal activities. Avoid sitting or lying for a long time without moving.  Maintain a healthy weight. Ask your health care provider what weight is healthy for you.  Do not use any products that contain nicotine or tobacco, such as cigarettes, e-cigarettes, and chewing tobacco. If you need help quitting, ask your health care provider.  Talk with your health care provider about any travel plans. It is important to make sure that you are still able to take your medicine while traveling.  Keep all follow-up visits as told by your health care provider. This is important. Where to find more information  American Lung Association: www.lung.org  Centers for Disease Control and Prevention: www.cdc.gov Contact a health care provider if:  You missed a dose of your blood thinner medicine. Get help right away if you:  Have: ? New or increased pain, swelling, warmth, or redness in an arm or leg. ? Shortness of breath that gets worse during activity or at rest. ? A fever. ? Worsening chest pain. ? A rapid or irregular heartbeat. ? A severe headache. ? Vision changes. ? A serious fall or accident, or you hit your head. ? Stomach pain. ? Blood in your vomit, stool, or urine. ? A cut that will not stop bleeding.  Cough up blood.  Feel light-headed or dizzy, and that feeling does not go away.  Cannot move your arms or legs.  Are  confused or have memory loss. These symptoms may represent a serious problem that is an emergency. Do not wait to see if the symptoms will go away. Get medical help right away. Call your local emergency services (911 in the [place] Do not drive yourself to the hospital. Summary  A pulmonary embolism (PE) is a serious and potentially life-threatening condition, in which a blood clot from one part of the body (deep vein thrombosis, DVT) travels to the arteries of the lung, causing a sudden blockage or decrease of blood flow to the lungs. This may result in shortness of breath, chest pain, dizziness, and fainting.  Treatments for this condition usually include medicines to thin your blood (anticoagulants) or medicines to break apart blood clots (thrombolytics).  If you are given blood thinners, take your medicine exactly as told by your health care provider, at the same time every day. This is important.  Understand what foods and drugs interact with any medicines that you are taking.  If you have signs of PE or DVT, call your local emergency services (911 in the [place] This information is not   intended to replace advice given to you by your health care provider. Make sure you discuss any questions you have with your health care provider. Document Revised: 10/15/2019 Document Reviewed: 10/15/2019 Elsevier Patient Education  2021 [employer]vier Inc.  

## 2021-03-27 NOTE — Progress Notes (Signed)
She presents today for follow-up of her painful lesion to the posterior aspect of her right foot.  States that she was recently diagnosed with esophageal cancer.  She has not been doing well.  States that the foot feels better but she still gets radiating pain occasionally.  Has not been taking care of it as she has been previously.  Objective: Vital signs are stable alert oriented x3.  There is no erythema edema/drainage odor postinflammatory hyperpigmentation is still present appears to be some blood around the area but does not demonstrate any obvious dysplasia.  It appears to be a more of a wart or even even a bedsore that is healing.  Assessment: Resolving wound to the posterior aspect of the right foot.  Plan: At this point I will place her in a silicone pad and this should help alleviate her symptoms.  She will follow-up with me as needed.

## 2021-03-29 ENCOUNTER — Encounter: Payer: Self-pay | Admitting: Family Medicine

## 2021-03-29 DIAGNOSIS — I2699 Other pulmonary embolism without acute cor pulmonale: Secondary | ICD-10-CM | POA: Insufficient documentation

## 2021-03-29 NOTE — Assessment & Plan Note (Signed)
Per gi / oncology Being worked up

## 2021-03-29 NOTE — Assessment & Plan Note (Signed)
Well controlled, no changes to meds. Encouraged heart healthy diet such as the DASH diet and exercise as tolerated.  °

## 2021-03-29 NOTE — Assessment & Plan Note (Signed)
On xarelto 

## 2021-04-02 ENCOUNTER — Ambulatory Visit (HOSPITAL_COMMUNITY)
Admission: RE | Admit: 2021-04-02 | Discharge: 2021-04-02 | Disposition: A | Discharge status: Home / Self Care | Payer: Medicare Other | Source: Ambulatory Visit | Attending: Oncology | Admitting: Oncology

## 2021-04-02 ENCOUNTER — Other Ambulatory Visit: Payer: Self-pay

## 2021-04-02 DIAGNOSIS — C154 Malignant neoplasm of middle third of esophagus: Secondary | ICD-10-CM | POA: Insufficient documentation

## 2021-04-02 DIAGNOSIS — I700 Atherosclerosis of aorta: Secondary | ICD-10-CM | POA: Insufficient documentation

## 2021-04-02 DIAGNOSIS — I2510 Atherosclerotic heart disease of native coronary artery without angina pectoris: Secondary | ICD-10-CM | POA: Diagnosis not present

## 2021-04-02 DIAGNOSIS — K5730 Diverticulosis of large intestine without perforation or abscess without bleeding: Secondary | ICD-10-CM | POA: Diagnosis not present

## 2021-04-02 DIAGNOSIS — K869 Disease of pancreas, unspecified: Secondary | ICD-10-CM | POA: Diagnosis not present

## 2021-04-02 LAB — GLUCOSE, CAPILLARY: Glucose-Capillary: 115 mg/dL — ABNORMAL HIGH (ref 70–99)

## 2021-04-02 MED ORDER — FLUDEOXYGLUCOSE F - 18 (FDG) INJECTION
8.2000 | Freq: Once | INTRAVENOUS | Status: AC
  Administered 2021-04-02: 8 via INTRAVENOUS

## 2021-04-03 ENCOUNTER — Ambulatory Visit: Payer: Medicare Other | Admitting: Podiatry

## 2021-04-04 ENCOUNTER — Telehealth: Payer: Self-pay | Admitting: *Deleted

## 2021-04-04 NOTE — Telephone Encounter (Signed)
Notified daughter, Cline Crock of PET results that show no metastatic disease. F/U with surgeon at [facility] as scheduled. Notified radiology to push over images to [facility] for her referral on June 6th.

## 2021-04-05 ENCOUNTER — Encounter: Payer: Self-pay | Admitting: *Deleted

## 2021-04-05 NOTE — Progress Notes (Signed)
Faxed completed FMLA forms for daughter, Astrid Drafts to [facility] [employer] # [phone]. Copy to HIM and daughter will pick up original at appointment on 04/20/21.

## 2021-04-20 ENCOUNTER — Other Ambulatory Visit (INDEPENDENT_AMBULATORY_CARE_PROVIDER_SITE_OTHER): Payer: Medicare Other

## 2021-04-20 ENCOUNTER — Other Ambulatory Visit: Payer: Self-pay

## 2021-04-20 ENCOUNTER — Inpatient Hospital Stay: Payer: Medicare Other | Attending: Oncology | Admitting: Oncology

## 2021-04-20 VITALS — BP 140/60 | HR 72 | Temp 97.8°F | Resp 20 | Ht 64.0 in | Wt 162.0 lb

## 2021-04-20 DIAGNOSIS — I771 Stricture of artery: Secondary | ICD-10-CM | POA: Diagnosis not present

## 2021-04-20 DIAGNOSIS — K219 Gastro-esophageal reflux disease without esophagitis: Secondary | ICD-10-CM | POA: Diagnosis not present

## 2021-04-20 DIAGNOSIS — I10 Essential (primary) hypertension: Secondary | ICD-10-CM | POA: Diagnosis not present

## 2021-04-20 DIAGNOSIS — M549 Dorsalgia, unspecified: Secondary | ICD-10-CM | POA: Insufficient documentation

## 2021-04-20 DIAGNOSIS — C159 Malignant neoplasm of esophagus, unspecified: Secondary | ICD-10-CM | POA: Insufficient documentation

## 2021-04-20 DIAGNOSIS — M7989 Other specified soft tissue disorders: Secondary | ICD-10-CM | POA: Insufficient documentation

## 2021-04-20 DIAGNOSIS — K862 Cyst of pancreas: Secondary | ICD-10-CM | POA: Insufficient documentation

## 2021-04-20 DIAGNOSIS — E119 Type 2 diabetes mellitus without complications: Secondary | ICD-10-CM | POA: Diagnosis not present

## 2021-04-20 DIAGNOSIS — Z8542 Personal history of malignant neoplasm of other parts of uterus: Secondary | ICD-10-CM | POA: Diagnosis not present

## 2021-04-20 DIAGNOSIS — I6529 Occlusion and stenosis of unspecified carotid artery: Secondary | ICD-10-CM

## 2021-04-20 DIAGNOSIS — Z79899 Other long term (current) drug therapy: Secondary | ICD-10-CM | POA: Diagnosis not present

## 2021-04-20 DIAGNOSIS — G4733 Obstructive sleep apnea (adult) (pediatric): Secondary | ICD-10-CM | POA: Insufficient documentation

## 2021-04-20 DIAGNOSIS — G8929 Other chronic pain: Secondary | ICD-10-CM | POA: Insufficient documentation

## 2021-04-20 DIAGNOSIS — C154 Malignant neoplasm of middle third of esophagus: Secondary | ICD-10-CM

## 2021-04-20 DIAGNOSIS — I2699 Other pulmonary embolism without acute cor pulmonale: Secondary | ICD-10-CM | POA: Insufficient documentation

## 2021-04-20 DIAGNOSIS — E1165 Type 2 diabetes mellitus with hyperglycemia: Secondary | ICD-10-CM | POA: Diagnosis not present

## 2021-04-20 DIAGNOSIS — Z7901 Long term (current) use of anticoagulants: Secondary | ICD-10-CM | POA: Insufficient documentation

## 2021-04-20 LAB — CBC WITH DIFFERENTIAL/PLATELET
Basophils Absolute: 0 10*3/uL (ref 0.0–0.1)
Basophils Relative: 0.6 % (ref 0.0–3.0)
Eosinophils Absolute: 0.1 10*3/uL (ref 0.0–0.7)
Eosinophils Relative: 1 % (ref 0.0–5.0)
HCT: 34.9 % — ABNORMAL LOW (ref 36.0–46.0)
Hemoglobin: 11.4 g/dL — ABNORMAL LOW (ref 12.0–15.0)
Lymphocytes Relative: 31.5 % (ref 12.0–46.0)
Lymphs Abs: 2.1 10*3/uL (ref 0.7–4.0)
MCHC: 32.7 g/dL (ref 30.0–36.0)
MCV: 83.5 fl (ref 78.0–100.0)
Monocytes Absolute: 0.3 10*3/uL (ref 0.1–1.0)
Monocytes Relative: 5.3 % (ref 3.0–12.0)
Neutro Abs: 4.1 10*3/uL (ref 1.4–7.7)
Neutrophils Relative %: 61.6 % (ref 43.0–77.0)
Platelets: 175 10*3/uL (ref 150.0–400.0)
RBC: 4.18 Mil/uL (ref 3.87–5.11)
RDW: 15.8 % — ABNORMAL HIGH (ref 11.5–15.5)
WBC: 6.6 10*3/uL (ref 4.0–10.5)

## 2021-04-20 LAB — COMPREHENSIVE METABOLIC PANEL
ALT: 15 U/L (ref 0–35)
AST: 10 U/L (ref 0–37)
Albumin: 3.8 g/dL (ref 3.5–5.2)
Alkaline Phosphatase: 66 U/L (ref 39–117)
BUN: 29 mg/dL — ABNORMAL HIGH (ref 6–23)
CO2: 29 mEq/L (ref 19–32)
Calcium: 9.2 mg/dL (ref 8.4–10.5)
Chloride: 104 mEq/L (ref 96–112)
Creatinine, Ser: 0.99 mg/dL (ref 0.40–1.20)
GFR: 49.96 mL/min — ABNORMAL LOW (ref >60.00)
Glucose, Bld: 136 mg/dL — ABNORMAL HIGH (ref 70–99)
Potassium: 4.4 mEq/L (ref 3.5–5.1)
Sodium: 139 mEq/L (ref 135–145)
Total Bilirubin: 0.3 mg/dL (ref 0.2–1.2)
Total Protein: 6.1 g/dL (ref 6.0–8.3)

## 2021-04-20 LAB — HEPATIC FUNCTION PANEL
ALT: 15 U/L (ref 0–35)
AST: 10 U/L (ref 0–37)
Albumin: 3.8 g/dL (ref 3.5–5.2)
Alkaline Phosphatase: 66 U/L (ref 39–117)
Bilirubin, Direct: 0.1 mg/dL (ref 0.0–0.3)
Total Bilirubin: 0.3 mg/dL (ref 0.2–1.2)
Total Protein: 6.1 g/dL (ref 6.0–8.3)

## 2021-04-20 LAB — LIPID PANEL
Cholesterol: 135 mg/dL (ref 0–200)
HDL: 48 mg/dL (ref >39.00)
LDL Cholesterol: 66 mg/dL (ref 0–99)
NonHDL: 86.83
Total CHOL/HDL Ratio: 3
Triglycerides: 105 mg/dL (ref 0.0–149.0)
VLDL: 21 mg/dL (ref 0.0–40.0)

## 2021-04-20 LAB — MICROALBUMIN / CREATININE URINE RATIO
Creatinine,U: 82.1 mg/dL
Microalb Creat Ratio: 5.8 mg/g (ref 0.0–30.0)
Microalb, Ur: 4.8 mg/dL — ABNORMAL HIGH (ref 0.0–1.9)

## 2021-04-20 LAB — HEMOGLOBIN A1C: Hgb A1c MFr Bld: 8.3 % — ABNORMAL HIGH (ref 4.6–6.5)

## 2021-04-20 MED ORDER — BUMETANIDE 1 MG PO TABS: 1.0000 mg | ORAL_TABLET | Freq: Every day | ORAL | 1 refills | Status: DC

## 2021-04-20 MED ORDER — GLIMEPIRIDE 4 MG PO TABS: 4.0000 mg | ORAL_TABLET | Freq: Every day | ORAL | 1 refills | Status: DC

## 2021-04-20 MED ORDER — RIVAROXABAN 20 MG PO TABS: 20.0000 mg | ORAL_TABLET | Freq: Every day | ORAL | 2 refills | Status: DC

## 2021-04-20 NOTE — Progress Notes (Signed)
[facility] OFFICE PROGRESS NOTE   Diagnosis: Esophagus cancer, pancreas IPMN  INTERVAL HISTORY:   [name] returns as scheduled.  She is here today with her daughter.  She denies dysphagia and abdominal pain.  Good appetite.  She has mild leg swelling.  No new complaint. She underwent a staging PET scan on 04/02/2021.  Hypermetabolism was noted at the periphery of the cystic pancreas head mass with associated Intermatic extrahepatic biliary ductal dilatation.  Low-level hypermetabolism is associated with a small, 0.7 cm, nodular focus in the posterior wall of the thoracic esophagus.  No evidence of metastatic disease.  She is scheduled to see Dr. Harl Bowie in early June.  Objective:  Vital signs in last 24 hours:  Blood pressure 140/60, pulse 72, temperature 97.8 F (36.6 C), temperature source Oral, resp. rate 20, height 5\' 4"  (7.322 m), weight 162 lb (73.5 kg), SpO2 98 %.    Resp: Lungs clear bilaterally Cardio: Regular rate and rhythm GI: No hepatosplenomegaly, no mass, nontender Vascular: Trace lower leg edema bilaterally    Lab Results:  Lab Results  Component Value Date   WBC 7.6 03/18/2021   HGB 11.6 (L) 03/18/2021   HCT 37.7 03/18/2021   MCV 88.3 03/18/2021   PLT 190 03/18/2021   NEUTROABS 4.2 03/16/2021    CMP  Lab Results  Component Value Date   NA 138 03/18/2021   K 4.3 03/18/2021   CL 105 03/18/2021   CO2 27 03/18/2021   GLUCOSE 161 (H) 03/18/2021   BUN 20 03/18/2021   CREATININE 0.80 03/18/2021   CALCIUM 9.2 03/18/2021   PROT 7.2 03/16/2021   ALBUMIN 3.9 03/16/2021   AST 15 03/16/2021   ALT 25 03/16/2021   ALKPHOS 73 03/16/2021   BILITOT 0.1 (L) 03/16/2021   GFRNONAA >60 03/18/2021     Medications: I have reviewed the patient's current medications.   Assessment/Plan: 1. Esophagus cancer   Upper EUS 03/05/2021-small submucosal ulcerating nodule with no bleeding and no stigmata of recent bleeding in the midesophagus, 29 to  31 cm from the incisors.  Lesion nonobstructing and not circumferential.  Patchy mildly erythematous mucosa without bleeding was found in the gastric body and gastric antrum.  The esophagus lesion was staged T1sm N0 MX.  Pancreatic parenchymal abnormalities consisting of atrophy were noted in the entire pancreas.  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  There was dilation of the common bile duct.  No malignant appearing lymph nodes were visualized in the middle paraesophageal mediastinum, lower paraesophageal mediastinum, celiac region, peripancreatic region and porta hepatis region.  Esophagus biopsy showed invasive carcinoma.  Stomach biopsy showed reactive gastropathy; no intestinal metaplasia, dysplasia or malignancy.  PET 04/02/2021- low-level hypermetabolism associated with a 0.7 cm nodule at the mid thoracic esophagus, hypermetabolic cystic pancreas head mass with diffuse intrahepatic and extra fatigability duct dilatation compatible with malignant main duct IPMN, no evidence of metastatic disease  CT chest and pelvis 03/16/2021-nonocclusive right lower lobe pulmonary embolus extending into segmental branches of the right lower lobe.  Nodular area at the left lung base with bandlike changes that extend from this area on the current study and on the prior study.  This may represent a combination of scarring and atelectasis.  Measures 1.4 x 1.2 cm, previously 2.1 x 1.9 cm.  Suggestion of slight interval decrease in size as well  of a right lower lobe process with nodular features.  Persistent marked biliary duct distention. 2. CT scan of the abdomen on 01/25/2021 due to history of a pancreatic mass.  The CT showed gross intra and extrahepatic biliary ductal dilatation, the central common bile duct appeared to contain a poorly calcified calculus or  soft tissue nodule measuring approximately 1.9 cm.  There was diffuse atrophy of the pancreatic parenchyma and dilatation of the main pancreatic duct.  There was a hypodense mass or gross pancreatic ductal dilatation within the pancreatic head measuring 3.5 x 2.0 x 2.0 cm.  Nodular appearing consolidation of the dependent left lung base measuring 2.4 x 1.8 cm, nonspecific. 3. ERCP by Dr. Rush Landmark on 03/05/2021.  The major papilla was located entirely within a pantaloon diverticulum.  Prior biliary sphincterotomy appeared open.  Pancreatic duct fishmouth deformity noted.  Filling defects consistent with stones and sludge were seen on the cholangiogram.  The entire main bile duct was severely dilated.  Choledocholithiasis was found.  Biliary sphincteroplasty performed.  Electrohydraulic lithotripsy of a remaining stone performed.   4. Pulmonary embolus on chest CT 03/15/2021-on Xarelto.  Bilateral lower extremity venous Doppler study with no significant femoral-popliteal DVT.  Limited assessment of the calf veins.  5.7 cm left popliteal fossa Baker's cyst. 5. GERD 6. Hypertension 7. Diabetes 8. OSA 9. Subclavian arterial stenosis 10. History of uterine cancer 11. Chronic back pain maintained on MS Contin, followed by Dr. Nelva Bush 12. IPMN- diagnosed in July 2020 13. Biliary stones-status post ERCP/sphincterotomy 07/14/2019 for removal of stones    Disposition: Ms. Hogle has been diagnosed with esophagus cancer.  She appears to have early stage disease, potentially amenable to endoscopic resection.  She has a history of an IPMN with a cystic pancreas head mass.  The mass is hypermetabolic on the staging PET scan, likely a malignant IPMN.  I discussed the PET findings and reviewed the images with Ms. Kuenzel and her daughter.  Her case will be presented at the GI tumor conference on 04/25/2021.  She is scheduled to see Dr. Harl Bowie on 05/21/2021.  She will return for an office visit here on  06/01/2021.  She appears asymptomatic from both the esophagus and pancreas tumors.  I doubt she would be a surgical candidate for the pancreas tumor.  Observation of both lesions may be the best option.    Betsy Coder, MD  04/20/2021  9:08 AM

## 2021-04-25 ENCOUNTER — Other Ambulatory Visit: Payer: Self-pay | Admitting: Family Medicine

## 2021-04-25 ENCOUNTER — Telehealth: Payer: Self-pay

## 2021-04-25 ENCOUNTER — Other Ambulatory Visit: Payer: Self-pay

## 2021-04-25 DIAGNOSIS — E1165 Type 2 diabetes mellitus with hyperglycemia: Secondary | ICD-10-CM

## 2021-04-25 NOTE — Progress Notes (Signed)
The proposed treatment discussed in conference is for discussion purposes only and is not a binding recommendation.  The patients have not been physically examined, or presented with their treatment options.  Therefore, final treatment plans cannot be decided.   

## 2021-04-25 NOTE — Telephone Encounter (Signed)
Left a voice message for patient's daughter regarding results of Tumor Board discussion today.  Per Dr. Benay Spice I have informed her that it was deemed that observation would be appropriate.  Also that patient can follow up with Dr. Harl Bowie at [facility] as needed. I left my direct number to call back if she has questions.

## 2021-04-26 ENCOUNTER — Other Ambulatory Visit: Payer: Self-pay | Admitting: Family Medicine

## 2021-04-26 ENCOUNTER — Encounter: Payer: Self-pay | Admitting: Family Medicine

## 2021-04-26 DIAGNOSIS — E1165 Type 2 diabetes mellitus with hyperglycemia: Secondary | ICD-10-CM

## 2021-04-26 NOTE — Telephone Encounter (Signed)
Correction ---  esophageal mass and panc atrophy  But endo referral placed

## 2021-04-26 NOTE — Telephone Encounter (Signed)
I got patient schedule with Sherry Holloway on 6/28   Should we do the medication or no?

## 2021-04-26 NOTE — Telephone Encounter (Signed)
If we can not get endo app for more than 1 month away we can try it ---- if we are able to get app quickly we can hold off

## 2021-04-26 NOTE — Telephone Encounter (Signed)
I will refer to endo due to her pancreatic mass and age

## 2021-04-26 NOTE — Telephone Encounter (Signed)
Are we adding the jardiance 10 mg daily?

## 2021-04-27 ENCOUNTER — Other Ambulatory Visit: Payer: Medicare Other

## 2021-04-30 ENCOUNTER — Encounter: Payer: Self-pay | Admitting: Neurology

## 2021-04-30 ENCOUNTER — Ambulatory Visit (INDEPENDENT_AMBULATORY_CARE_PROVIDER_SITE_OTHER): Payer: Medicare Other | Admitting: Neurology

## 2021-04-30 VITALS — BP 146/53 | HR 57 | Ht 64.0 in | Wt 162.0 lb

## 2021-04-30 DIAGNOSIS — E119 Type 2 diabetes mellitus without complications: Secondary | ICD-10-CM

## 2021-04-30 DIAGNOSIS — K8689 Other specified diseases of pancreas: Secondary | ICD-10-CM

## 2021-04-30 DIAGNOSIS — I6529 Occlusion and stenosis of unspecified carotid artery: Secondary | ICD-10-CM | POA: Diagnosis not present

## 2021-04-30 DIAGNOSIS — R0683 Snoring: Secondary | ICD-10-CM

## 2021-04-30 DIAGNOSIS — Z9189 Other specified personal risk factors, not elsewhere classified: Secondary | ICD-10-CM | POA: Diagnosis not present

## 2021-04-30 DIAGNOSIS — G4733 Obstructive sleep apnea (adult) (pediatric): Secondary | ICD-10-CM

## 2021-04-30 MED ORDER — APOAEQUORIN 10 MG PO CAPS: 10.0000 mg | ORAL_CAPSULE | Freq: Every day | ORAL | 5 refills | Status: DC

## 2021-04-30 NOTE — Patient Instructions (Signed)

## 2021-04-30 NOTE — Progress Notes (Signed)
SLEEP MEDICINE CLINIC    Provider:  Melvyn Novas, MD  Primary Care Physician:  Donato Schultz, DO [address] [place] [address]     Referring Provider: Virgina Organ [address] [place],  [place] [address]          Chief Complaint according to patient   Patient presents with:    . New Patient (Initial Visit)     Pt here with with daughter, rm 37. Pt had a SS about 15 years ago and was set up on CPAP. She uses it still. Moved from [place] in Nov 2021 , now  needing to establish care with sleep MD. Current machine was through DME in [place], she was set up 09/26/15 with the current machine.       HISTORY OF PRESENT ILLNESS:  Sherry Holloway is a 87 year old Caucasian female patient seen here upon referral on 04/30/2021 from PCP.  Chief concern according to patient :  CPAP needs rechecking.to lie air coming out.     I have the pleasure of seeing Sherry Holloway on 05-01-2021, a right-handed White or Caucasian female who  has a past medical history of Arthritis, Chicken pox, Diabetes mellitus without complication (HCC), Diverticulitis, Esophageal cancer (HCC) (dx'd 2020), GERD (gastroesophageal reflux disease), Hyperlipidemia, Hypertension, OSA (obstructive sleep apnea), Urine incontinence, and Uterine cancer (HCC).   The patient had the first sleep study in [place] -perhaps more than 16 years ago.  Started on FFM and presented today with data -the patient has been a compliant CPAP user in the past but it seems that for few months now she had some trouble tolerating the CPAP well it seems not to work as well for her she felt she does not get enough air pressure.  The download confirms that she has used the machine 87 out of 3087% out of 30 days x 9 of those days under 4 hours.  Average user time is 4 hours 44 minutes there is a minimum pressure of 10 maximum pressure setting of 14 cmH2O with 2 cm EPR.  This is a narrow setting the 95th  percentile pressure here is 10.2 cmH2O.  Please also note that she has very high air leakage partially probably due to the use of a full facemask with a 95th percentile being 62 L/min leakage.  Events per hour were rated as an AHI of 4.6 but unknown #4.2 these are created by high air leakage.  There were no clear obstructive apneas remaining and the central apnea count was 0.2/h.  Sleep relevant medical history: Arthritis, history of chickenpox, diabetes mellitus, diverticulitis, gastroesophageal reflux disease, hyperlipidemia and hypertension and history of uterine cancer and urinary incontinence.  The latest laboratory results came from February 02, 2021 and show an elevated HbA1c, a low sodium level normal liver function tests and normal kidney function, no evidence of leukocytosis and no evidence of anemia.  She underwent a CT of the abdomen with contrast in February of this year s/p cholecystectomy, no abnormality of the hepatobiliary tract was noted there was intra and extrahepatic biliary ductal dilation and the duct appears to contain a poorly calcified calculus.  A nodular consolidation of the left lung base have been noted, coronary artery disease with calcification have been noted, aortic ATHEROSCLEROSIS.  There is also discussion of a cystic pancreatic lesion.  In spite of having very little symptoms she was diagnosed with esophageal cancer.  Family medical /sleep history: oe daughter  on CPAP with OSA, insomnia, sleep walkers.    Social history:  Patient is  retired from being a homemaker and lives in a household alone. Family status is widowed, with  4 children,  2 sons and 2 daughters.   Tobacco use- quit in 1990.  ETOH use none , Caffeine intake in form of Coffee( 2 coffees a day) Hobbies : paint.       Sleep habits are as follows:  The patient's dinner time is between 7 PM. The patient goes to bed at 10 PM and continues to sleep for 2-3  hours, wakes up a lot, due to pain  In the  heel, the first time at 1 AM.   The preferred sleep position is lateral , with the support of 1 pillow.  Dreams are reportedly frequent.   7AM is the usual rise time. The patient wakes up spontaneously.  She reports not feeling refreshed or restored in AM, with symptoms such as dry mouth,  and residual fatigue.  Naps are taken infrequently, lasting from 30 minutes and are more refreshing than nocturnal sleep.    Review of Systems: Out of a complete 14 system review, the patient complains of only the following symptoms, and all other reviewed systems are negative.:  Fatigue, sleepiness , snoring, fragmented sleep, pain,    How likely are you to doze in the following situations: 0 = not likely, 1 = slight chance, 2 = moderate chance, 3 = high chance   Sitting and Reading? Watching Television? Sitting inactive in a public place (theater or meeting)? As a passenger in a car for an hour without a break? Lying down in the afternoon when circumstances permit? Sitting and talking to someone? Sitting quietly after lunch without alcohol? In a car, while stopped for a few minutes in traffic?   Total = 11/ 24 points   FSS endorsed at 37/ 63 points.   Social History   Socioeconomic History  . Marital status: Widowed    Spouse name: Not on file  . Number of children: 4  . Years of education: Not on file  . Highest education level: Not on file  Occupational History  . Occupation: Retired Quarry manager  Tobacco Use  . Smoking status: Former Smoker    Types: Cigarettes    Quit date: 01/17/1989    Years since quitting: 32.3  . Smokeless tobacco: Never Used  Vaping Use  . Vaping Use: Never used  Substance and Sexual Activity  . Alcohol use: Not Currently  . Drug use: Never  . Sexual activity: Not Currently  Other Topics Concern  . Not on file  Social History Narrative   Just moved from [place]--- and moved into an apt 2.4 miles from her daughter    Social Determinants of Health   Financial  Resource Strain: Not on file  Food Insecurity: Not on file  Transportation Needs: Not on file  Physical Activity: Not on file  Stress: Not on file  Social Connections: Not on file    Family History  Problem Relation Age of Onset  . Diabetes Mother   . Heart disease Father   . Lung cancer Sister   . Lung cancer Son   . Hypertension Son   . Hypertension Daughter   . Diabetes Son   . Hypertension Son   . Diabetes Daughter   . Hypertension Daughter     Past Medical History:  Diagnosis Date  . Arthritis   .  Chicken pox   . Diabetes mellitus without complication ([place])   . Diverticulitis   . Esophageal cancer ([place]) dx'd 2020  . GERD (gastroesophageal reflux disease)   . Hyperlipidemia   . Hypertension   . OSA (obstructive sleep apnea)   . Urine incontinence   . Uterine cancer [facility])     Past Surgical History:  Procedure Laterality Date  . ABDOMINAL HYSTERECTOMY  1972  . APPENDECTOMY    . BILIARY DILATION  03/05/2021   Procedure: BILIARY DILATION;  Surgeon: Rush Landmark Telford Nab., MD;  Location: [facility];  Service: Gastroenterology;;  . BIOPSY  03/05/2021   Procedure: BIOPSY;  Surgeon: Irving Copas., MD;  Location: [facility];  Service: Gastroenterology;;  . Lorin Mercy    . ENDOSCOPIC RETROGRADE CHOLANGIOPANCREATOGRAPHY (ERCP) WITH PROPOFOL N/A 03/05/2021   Procedure: ENDOSCOPIC RETROGRADE CHOLANGIOPANCREATOGRAPHY (ERCP) WITH PROPOFOL;  Surgeon: Rush Landmark Telford Nab., MD;  Location: [facility];  Service: Gastroenterology;  Laterality: N/A;  . ESOPHAGOGASTRODUODENOSCOPY (EGD) WITH PROPOFOL N/A 03/05/2021   Procedure: ESOPHAGOGASTRODUODENOSCOPY (EGD) WITH PROPOFOL;  Surgeon: Rush Landmark Telford Nab., MD;  Location: [facility];  Service: Gastroenterology;  Laterality: N/A;  . EUS N/A 03/05/2021   Procedure: UPPER ENDOSCOPIC ULTRASOUND (EUS) RADIAL;  Surgeon: Rush Landmark Telford Nab., MD;  Location: [facility];  Service: Gastroenterology;  Laterality: N/A;   . LUMBAR LAMINECTOMY    . REMOVAL OF STONES  03/05/2021   Procedure: REMOVAL OF STONES;  Surgeon: Rush Landmark Telford Nab., MD;  Location: [facility];  Service: Gastroenterology;;  . SPINAL CORD STIMULATOR IMPLANT    . SPYGLASS CHOLANGIOSCOPY N/A 03/05/2021   Procedure: SPYGLASS CHOLANGIOSCOPY;  Surgeon: Irving Copas., MD;  Location: [facility];  Service: Gastroenterology;  Laterality: N/A;  Bess Kinds LITHOTRIPSY N/A 03/05/2021   Procedure: OEUMPNTI LITHOTRIPSY;  Surgeon: Irving Copas., MD;  Location: [facility];  Service: Gastroenterology;  Laterality: N/A;  . TONSILLECTOMY    . TOTAL KNEE ARTHROPLASTY Right      Current Outpatient Medications on File Prior to Visit  Medication Sig Dispense Refill  . acetaminophen (TYLENOL) 325 MG tablet Take 352-650 mg by mouth every 6 (six) hours as needed for mild pain or moderate pain.    . bumetanide (BUMEX) 1 MG tablet Take 1 tablet (1 mg total) by mouth daily. Take 1/2 to 1 tablet daily for swelling 90 tablet 1  . docusate sodium (COLACE) 100 MG capsule Take 1 capsule (100 mg total) by mouth 2 (two) times daily. 10 capsule 0  . famciclovir (FAMVIR) 125 MG tablet Take 1 tablet (125 mg total) by mouth daily. 90 tablet 3  . glimepiride (AMARYL) 2 MG tablet Take 2 mg by mouth daily with supper.    Marland Kitchen glimepiride (AMARYL) 4 MG tablet Take 1 tablet (4 mg total) by mouth daily before breakfast. 90 tablet 1  . glucose blood test strip Check blood sugars twice daily 200 each 12  . latanoprost (XALATAN) 0.005 % ophthalmic solution Place 1 drop into both eyes at bedtime.    . lipase/protease/amylase (CREON) 36000 UNITS CPEP capsule Take 1 capsule (36,000 Units total) by mouth 3 (three) times daily before meals. (Patient taking differently: Take 36,000 Units by mouth 2 (two) times daily.) 270 capsule 1  . mirtazapine (REMERON) 15 MG tablet Take 1 tablet (15 mg total) by mouth at bedtime. (Patient taking differently: Take 15 mg by mouth  daily with supper.) 30 tablet 0  . morphine (MS CONTIN) 30 MG 12 hr tablet Take 30 mg by mouth every 12 (twelve) hours.    Marland Kitchen  Multiple Vitamins-Minerals (WOMENS 50+ MULTI VITAMIN/MIN PO) Take 1 tablet by mouth daily with supper.    Marland Kitchen omeprazole (PRILOSEC) 20 MG capsule Take 1 capsule (20 mg total) by mouth 2 (two) times daily before a meal. 180 capsule 3  . rivaroxaban (XARELTO) 20 MG TABS tablet Take 1 tablet (20 mg total) by mouth daily with supper. Please start only after the starter pack has been completed. 30 tablet 2  . valsartan (DIOVAN) 40 MG tablet Take 40 mg by mouth daily with breakfast.     No current facility-administered medications on file prior to visit.    Allergies  Allergen Reactions  . Gramicidin Other (See Comments)    unknown  . Sulfa Antibiotics Rash  . Wound Dressing Adhesive Rash    Physical exam:  Today's Vitals   04/30/21 1306  BP: (!) 146/53  Pulse: (!) 57  Weight: 162 lb (73.5 kg)  Height: 5\' 4"  (1.626 m)   Body mass index is 27.81 kg/m.   Wt Readings from Last 3 Encounters:  04/30/21 162 lb (73.5 kg)  04/20/21 162 lb (73.5 kg)  03/27/21 163 lb 6.4 oz (74.1 kg)     Ht Readings from Last 3 Encounters:  04/30/21 5\' 4"  (1.626 m)  04/20/21 5\' 4"  (1.626 m)  03/27/21 5\' 4"  (1.626 m)      General: The patient is awake, alert and appears not in acute distress. The patient is well groomed. Head: Normocephalic, atraumatic. Neck is supple. Mallampati 2  neck circumference: 15 inches . Nasal airflow  patent.  Retrognathia is not seen.  Dental status:  Cardiovascular:  Regular rate and cardiac rhythm by pulse,  without distended neck veins. Respiratory: Lungs are clear to auscultation.  Skin:  Without evidence of ankle edema, or rash. Trunk: The patient's posture is erect.   Neurologic exam : The patient is awake and alert, oriented to place and time.   Memory subjective described as intact.  Attention span & concentration ability appears normal.   Speech is fluent,  without  dysarthria, dysphonia or aphasia.  Mood and affect are appropriate.   Cranial nerves: no loss of smell or taste reported  Pupils are equal and briskly reactive to light. Funduscopic exam deferred. .  Extraocular movements in vertical and horizontal planes were intact and without nystagmus. No Diplopia. Visual fields by finger perimetry are intact. Hearing was impaired, bilaterally.  Facial sensation intact to fine touch.  Facial motor strength is symmetric and tongue and uvula move midline.  Neck ROM : rotation, tilt and flexion extension were normal for age and shoulder shrug was symmetrical.    Motor exam:  Symmetric bulk, tone and ROM.   Normal tone without cog wheeling, symmetric grip strength .   Sensory:  Fine touch, pinprick and vibration were  normal.  Proprioception tested in the upper extremities was normal.   Coordination: Rapid alternating movements in the fingers/hands were of normal speed.  The Finger-to-nose maneuver was intact -with mild evidence of ataxia, dysmetria on the right side only.     Gait and station: Patient could rise unassisted from a seated position, walked with a walker - uses an assistive device.    Further deferred.  Deep tendon reflexes: in the  upper and lower extremities are symmetric and intact.  Babinski response was deferred .        After spending a total time of  45  minutes face to face and additional time for physical and neurologic examination, review of  laboratory studies,  personal review of imaging studies, reports and results of other testing and review of referral information / records as far as provided in visit, I have established the following assessments:  1) there has been some discomfort with the CPAP, that is relatively new.  She has a lot of air leakage - and she has many unclassified apneas. weight loss was not present- she uses a FFM, last replaced about Christmas, 2021. 2) GERD  Has been  present, former tobacco user.  3)  Oesophageal cancer diagnosis was new- and certainly a shock to the patient and her family.    My Plan is to proceed with:  1) I would very much prefer for Sherry Holloway to be retested as a not sure how much sleep apnea has truly remained and if her last sleep study was more than a decade ago there has certainly been a change in her medical history on so many fronts.  I also would like him to under the circumstances to be refitted for a mask or interface.  So I am not sure she is a mouth breather but she does wake up with a dry mouth..  There may be a way to get her using a full facemask that is not as likely to create air leakage and maybe the size has to be adjusted.  I usually prepare fullface masks that do not rest on the forehead but only under the nose and over the mouth.  HST and attended sleep study were orderd.  I would like to thank  Ann Held, Do [address] [place],  [place] [address] for allowing me to meet with and to take care of this pleasant patient.   In short, Sherry Holloway is presenting with dry mouth, some sleepiness and fatigue and newly non-restorative sleep. She is taking pain medication - she is on morphine 30 for back pain, not cancer. This may create central apneas.   a symptom that can be attributed to the underlying cancer and the poor fitting FFM- I plan to follow up either personally or through our NP within 2-3 month.   The next visit will include a memory test upon patient's request.      Electronically signed by: Larey Seat, MD 04/30/2021 1:31 PM  [facility] [place] certified by The [employer] of Sleep Medicine and Diplomate of the [employer] of Sleep Medicine. Board certified In Neurology through the [place], Fellow of the [employer] of Neurology. Medical Director of [employer].

## 2021-05-07 ENCOUNTER — Encounter: Payer: Self-pay | Admitting: Podiatry

## 2021-05-07 ENCOUNTER — Ambulatory Visit (INDEPENDENT_AMBULATORY_CARE_PROVIDER_SITE_OTHER): Payer: Medicare Other | Admitting: Podiatry

## 2021-05-07 ENCOUNTER — Other Ambulatory Visit: Payer: Self-pay

## 2021-05-07 DIAGNOSIS — G629 Polyneuropathy, unspecified: Secondary | ICD-10-CM | POA: Diagnosis not present

## 2021-05-07 DIAGNOSIS — D2371 Other benign neoplasm of skin of right lower limb, including hip: Secondary | ICD-10-CM | POA: Diagnosis not present

## 2021-05-07 DIAGNOSIS — I6529 Occlusion and stenosis of unspecified carotid artery: Secondary | ICD-10-CM | POA: Diagnosis not present

## 2021-05-07 MED ORDER — GABAPENTIN 100 MG PO CAPS: 100.0000 mg | ORAL_CAPSULE | Freq: Three times a day (TID) | ORAL | 3 refills | Status: DC

## 2021-05-07 NOTE — Progress Notes (Signed)
Subjective:   Patient ID: Sherry Holloway, female   DOB: 87 y.o.   MRN: [id number]   HPI Patient presents stating she still getting the burning pain she is not had any drainage but is still aggravating   ROS      Objective:  Physical Exam  Neurovascular status intact with what appears to be a benign tumor of skin of the right plantar medial heel is irritated no active drainage or swelling redness     Assessment:  Probability that this is related to pressure against this area and may be creating some nervelike pain     Plan:  I do not want to open this up and discussed with her and caregiver and at this point I did apply a booty to try to take all pressure off the area and we will start her on very low-dose gabapentin to see if we can help with the nerve pain she is experiencing.  She is encouraged to call with any changes or any other issues and I spent a great deal of time going over with them the treatment options and the lack of options we have unfortunately for this condition

## 2021-05-29 ENCOUNTER — Telehealth: Payer: Self-pay

## 2021-05-29 NOTE — Telephone Encounter (Signed)
-----   Message from Ladell Pier, MD sent at 05/29/2021  7:22 AM EDT ----- Regarding: RE: appointment I saw the note from Dr. Harl Bowie, okay to cancel the appointment this week and schedule her for 1 to 2 weeks after the procedure at [facility] ----- Message ----- From: Kelli Hope, LPN Sent: 9/35/5217   4:20 PM EDT To: Ladell Pier, MD Subject: appointment                                    Hi Dr Benay Spice above Pt's daughter wants to know if her appointment this Friday is necessary? She said Pt saw Dr Harl Bowie at [facility] and will have a procedure done late July. She wants to know do you want to see her after the procedure ?

## 2021-05-29 NOTE — Telephone Encounter (Signed)
-----   Message from Ladell Pier, MD sent at 05/29/2021  7:22 AM EDT ----- Regarding: RE: appointment I saw the note from Dr. Harl Bowie, okay to cancel the appointment this week and schedule her for 1 to 2 weeks after the procedure at [place] ----- Message ----- From: Kelli Hope, LPN Sent: 0/94/0768   4:20 PM EDT To: Ladell Pier, MD Subject: appointment                                    Hi Dr Benay Spice above Pt's daughter wants to know if her appointment this Friday is necessary? She said Pt saw Dr Harl Bowie at [facility] and will have a procedure done late July. She wants to know do you want to see her after the procedure ?

## 2021-05-29 NOTE — Telephone Encounter (Signed)
Pt's visit canceled. V/M message left for Pt's daughter to give a call to schedule appointment with Dr Benay Spice after procedure.

## 2021-06-01 ENCOUNTER — Ambulatory Visit: Payer: Medicare Other | Admitting: Oncology

## 2021-06-07 ENCOUNTER — Encounter: Payer: Self-pay | Admitting: Family Medicine

## 2021-06-12 ENCOUNTER — Other Ambulatory Visit: Payer: Self-pay

## 2021-06-12 ENCOUNTER — Encounter: Payer: Self-pay | Admitting: Internal Medicine

## 2021-06-12 ENCOUNTER — Ambulatory Visit: Payer: Medicare Other | Admitting: Internal Medicine

## 2021-06-12 ENCOUNTER — Ambulatory Visit (INDEPENDENT_AMBULATORY_CARE_PROVIDER_SITE_OTHER): Payer: Medicare Other | Admitting: Internal Medicine

## 2021-06-12 VITALS — BP 168/52 | HR 60 | Ht 64.0 in | Wt 164.0 lb

## 2021-06-12 DIAGNOSIS — E1142 Type 2 diabetes mellitus with diabetic polyneuropathy: Secondary | ICD-10-CM | POA: Insufficient documentation

## 2021-06-12 DIAGNOSIS — E1165 Type 2 diabetes mellitus with hyperglycemia: Secondary | ICD-10-CM | POA: Diagnosis not present

## 2021-06-12 NOTE — Patient Instructions (Signed)
-   Continue Glimepiride 4 mg , 1 tablet before Breakfast  - Continue Glimepiride 2 mg, 1 tablet before Supper     HOW TO TREAT LOW BLOOD SUGARS (Blood sugar LESS THAN 70 MG/DL) Please follow the RULE OF 15 for the treatment of hypoglycemia treatment (when your (blood sugars are less than 70 mg/dL)   STEP 1: Take 15 grams of carbohydrates when your blood sugar is low, which includes:  3-4 GLUCOSE TABS  OR 3-4 OZ OF JUICE OR REGULAR SODA OR ONE TUBE OF GLUCOSE GEL    STEP 2: RECHECK blood sugar in 15 MINUTES STEP 3: If your blood sugar is still low at the 15 minute recheck --> then, go back to STEP 1 and treat AGAIN with another 15 grams of carbohydrates.    AFTER SURGERY: Hold off on Glimepiride right after the surgery until you are certain of your eating habits    If your appetite is NOT up to par , please DO NOT start Glimepiride but contact me if your sugars are consistently 180 or higher   If your appetite is good , you may restart the Glimepiride

## 2021-06-12 NOTE — Progress Notes (Signed)
Name: Sherry Holloway  MRN/ DOB: [id number], [birth year]   Age/ Sex: 87 y.o., female    PCP: Carollee Herter, [name], DO   Reason for Endocrinology Evaluation: Type 2 Diabetes Mellitus     Date of Initial Endocrinology Visit: 06/12/2021     PATIENT IDENTIFIER: Sherry Holloway is a 87 y.o. female with a past medical history of T2DM, Hx of pancreatitis in 2020 and pancreatic head mass as well a esophageal SCC, and PE. The patient presented for initial endocrinology clinic visit on 06/12/2021 for consultative assistance with her diabetes management.    HPI: Sherry Holloway is accompanied by daughter today    Diagnosed with DM since the age 37  Prior Medications tried/Intolerance: as listed  Currently checking blood sugars occasionally Hypoglycemia episodes : no             Hemoglobin A1c has ranged from 8.1% in 2022, peaking at 8.3% in 2021. Patient required assistance for hypoglycemia: no Patient has required hospitalization within the last 1 year from hyper or hypoglycemia: no   In terms of diet, the patient eats 2 meals a day, does a lot of snacking , dinks occasional juice   Esophageal Sx July 14th   HOME DIABETES REGIMEN: Glimepiride 4 mg in Am  Glimepiride 2 mg in the evening    Statin: no ACE-I/ARB: yes Prior Diabetic Education: yes    METER DOWNLOAD SUMMARY:  did not bring    DIABETIC COMPLICATIONS: Microvascular complications:  Fluctuating GFR Denies: retinopathy , neuropathy  Last eye exam: Completed 2022  Macrovascular complications:   Denies: CAD, PVD, CVA   PAST HISTORY: Past Medical History:  Past Medical History:  Diagnosis Date   Arthritis    Chicken pox    Diabetes mellitus without complication ([place])    Diverticulitis    Esophageal cancer ([place]) dx'd 2020   GERD (gastroesophageal reflux disease)    Hyperlipidemia    Hypertension    OSA (obstructive sleep apnea)    Urine incontinence    Uterine cancer ([place])    Past Surgical History:  Past  Surgical History:  Procedure Laterality Date   ABDOMINAL HYSTERECTOMY  1972   APPENDECTOMY     BILIARY DILATION  03/05/2021   Procedure: BILIARY DILATION;  Surgeon: Irving Copas., MD;  Location: [facility];  Service: Gastroenterology;;   BIOPSY  03/05/2021   Procedure: BIOPSY;  Surgeon: Irving Copas., MD;  Location: [facility];  Service: Gastroenterology;;   CHOLECYSTECTOMY     ENDOSCOPIC RETROGRADE CHOLANGIOPANCREATOGRAPHY (ERCP) WITH PROPOFOL N/A 03/05/2021   Procedure: ENDOSCOPIC RETROGRADE CHOLANGIOPANCREATOGRAPHY (ERCP) WITH PROPOFOL;  Surgeon: Irving Copas., MD;  Location: [facility];  Service: Gastroenterology;  Laterality: N/A;   ESOPHAGOGASTRODUODENOSCOPY (EGD) WITH PROPOFOL N/A 03/05/2021   Procedure: ESOPHAGOGASTRODUODENOSCOPY (EGD) WITH PROPOFOL;  Surgeon: Rush Landmark Telford Nab., MD;  Location: [facility];  Service: Gastroenterology;  Laterality: N/A;   EUS N/A 03/05/2021   Procedure: UPPER ENDOSCOPIC ULTRASOUND (EUS) RADIAL;  Surgeon: Irving Copas., MD;  Location: [facility];  Service: Gastroenterology;  Laterality: N/A;   LUMBAR LAMINECTOMY     REMOVAL OF STONES  03/05/2021   Procedure: REMOVAL OF STONES;  Surgeon: Rush Landmark Telford Nab., MD;  Location: [facility];  Service: Gastroenterology;;   SPINAL CORD STIMULATOR IMPLANT     SPYGLASS CHOLANGIOSCOPY N/A 03/05/2021   Procedure: ZDGUYQIH CHOLANGIOSCOPY;  Surgeon: Irving Copas., MD;  Location: [facility];  Service: Gastroenterology;  Laterality: N/A;   SPYGLASS LITHOTRIPSY N/A 03/05/2021   Procedure: SPYGLASS LITHOTRIPSY;  Surgeon: Irving Copas., MD;  Location: [facility];  Service: Gastroenterology;  Laterality: N/A;   TONSILLECTOMY     TOTAL KNEE ARTHROPLASTY Right     Social History:  reports that she quit smoking about 32 years ago. Her smoking use included cigarettes. She has never used smokeless tobacco. She reports previous alcohol use. She reports  that she does not use drugs. Family History:  Family History  Problem Relation Age of Onset   Diabetes Mother    Heart disease Father    Lung cancer Sister    Lung cancer Son    Hypertension Son    Hypertension Daughter    Diabetes Son    Hypertension Son    Diabetes Daughter    Hypertension Daughter      HOME MEDICATIONS: Allergies as of 06/12/2021       Reactions   Gramicidin Other (See Comments)   unknown   Sulfa Antibiotics Rash   Wound Dressing Adhesive Rash        Medication List        Accurate as of June 12, 2021  9:01 AM. If you have any questions, ask your nurse or doctor.          acetaminophen 325 MG tablet Commonly known as: TYLENOL Take 352-650 mg by mouth every 6 (six) hours as needed for mild pain or moderate pain.   Apoaequorin 10 MG Caps Take 10 mg by mouth daily.   bumetanide 1 MG tablet Commonly known as: BUMEX Take 1 tablet (1 mg total) by mouth daily. Take 1/2 to 1 tablet daily for swelling   docusate sodium 100 MG capsule Commonly known as: Colace Take 1 capsule (100 mg total) by mouth 2 (two) times daily.   famciclovir 125 MG tablet Commonly known as: FAMVIR Take 1 tablet (125 mg total) by mouth daily.   gabapentin 100 MG capsule Commonly known as: NEURONTIN Take 1 capsule (100 mg total) by mouth 3 (three) times daily.   glimepiride 2 MG tablet Commonly known as: AMARYL Take 2 mg by mouth daily with supper.   glimepiride 4 MG tablet Commonly known as: AMARYL Take 1 tablet (4 mg total) by mouth daily before breakfast.   glucose blood test strip Check blood sugars twice daily   latanoprost 0.005 % ophthalmic solution Commonly known as: XALATAN Place 1 drop into both eyes at bedtime.   lipase/protease/amylase 36000 UNITS Cpep capsule Commonly known as: Creon Take 1 capsule (36,000 Units total) by mouth 3 (three) times daily before meals. What changed: when to take this   mirtazapine 15 MG tablet Commonly known as:  REMERON Take 1 tablet (15 mg total) by mouth at bedtime. What changed: when to take this   morphine 30 MG 12 hr tablet Commonly known as: MS CONTIN Take 30 mg by mouth every 12 (twelve) hours.   omeprazole 20 MG capsule Commonly known as: PRILOSEC Take 1 capsule (20 mg total) by mouth 2 (two) times daily before a meal.   rivaroxaban 20 MG Tabs tablet Commonly known as: XARELTO Take 1 tablet (20 mg total) by mouth daily with supper. Please start only after the starter pack has been completed.   valsartan 40 MG tablet Commonly known as: DIOVAN Take 40 mg by mouth daily with breakfast.   WOMENS 50+ MULTI VITAMIN/MIN PO Take 1 tablet by mouth daily with supper.         ALLERGIES: Allergies  Allergen Reactions   Gramicidin Other (See Comments)  unknown   Sulfa Antibiotics Rash   Wound Dressing Adhesive Rash     REVIEW OF SYSTEMS: A comprehensive ROS was conducted with the patient and is negative except as per HPI     OBJECTIVE:   VITAL SIGNS: BP (!) 168/52   Pulse 60   Ht 5\' 4"  (1.626 m)   Wt 164 lb (74.4 kg)   SpO2 94%   BMI 28.15 kg/m    PHYSICAL EXAM:  General: Pt appears well and is in NAD  Neck: General: Supple without adenopathy or carotid bruits. Thyroid: Thyroid size normal.  No goiter or nodules appreciated.  Lungs: Clear with good BS bilat with no rales, rhonchi, or wheezes  Heart: RRR with normal S1 and S2 and no gallops; no murmurs; no rub  Abdomen: Normoactive bowel sounds, soft, nontender, without masses or organomegaly palpable  Extremities:  Lower extremities - 1+  pretibial edema.   Skin: Normal texture and temperature to palpation. No rash noted.   Neuro: MS is good with appropriate affect, pt is alert and Ox3    DM foot exam: 06/12/2021  The skin of the feet is intact without sores or ulcerations. The pedal pulses are 2+ on right and 2+ on left. The sensation is decreased to a screening 5.07, 10 gram monofilament  bilaterally   DATA REVIEWED:  Lab Results  Component Value Date   HGBA1C 8.3 (H) 04/20/2021   HGBA1C 8.1 (H) 02/06/2021   HGBA1C 8.3 (H) 10/19/2020   Lab Results  Component Value Date   MICROALBUR 4.8 (H) 04/20/2021   LDLCALC 66 04/20/2021   CREATININE 0.99 04/20/2021   Lab Results  Component Value Date   MICRALBCREAT 5.8 04/20/2021    Lab Results  Component Value Date   CHOL 135 04/20/2021   HDL 48.00 04/20/2021   LDLCALC 66 04/20/2021   TRIG 105.0 04/20/2021   CHOLHDL 3 04/20/2021        ASSESSMENT / PLAN / RECOMMENDATIONS:   1) Type 2 Diabetes Mellitus, Sub-Optimally controlled, With Neuropathic  complications - Most recent A1c of 8.3 %. Goal A1c < 7.5 %.    Plan: GENERAL: I have discussed with the patient the pathophysiology of diabetes. We went over the natural progression of the disease. We talked about both insulin resistance and insulin deficiency. We stressed the importance of lifestyle changes including diet and exercise. I explained the complications associated with diabetes including retinopathy, nephropathy, neuropathy as well as increased risk of cardiovascular disease. We went over the benefit seen with glycemic control.   I explained to the patient that diabetic patients are at higher than normal risk for amputations.  Given advanced age our main goal is to prevent Hypoglycemia and severe hyperglycemia We discussed risk  of hypoglycemia with SU , but she is not having any hypoglycemia at this time, we discussed long term glycemic agents, which I believe she will benefit from an SGLT-2 inhibitors We also discussed short term planning for pending esophageal surgery for SCC and how this may affect oral intake, daughter states there will not be any tube feeds and mother is expected to have normal oral intake, I have advised them that insulin would be the safest option during the transition period in the post op status , until we are clear of how much she is able  to eat /drink etc but daughter believes this will cause hardship due to pt's trigger finger, I demonstrated insulin pen use  After further discussion we have opted to continue  with SU for now but after her sx if the oral intake is low, need to hold SU and contact me with BG's > 180 mg/dL DPP-4 inhibitors and GLP-1 agonists are CONTRAINDICATED due to hx of pancreatitis   MEDICATIONS: Continue Glimepiride 4 mg , 1 tablet before Breakfast  Continue Glimepiride 2 mg, 1 tablet before Supper   EDUCATION / INSTRUCTIONS: BG monitoring instructions: Patient is instructed to check her blood sugars 1 times a day, fasting . Call [facility] if: BG persistently < 70  I reviewed the Rule of 15 for the treatment of hypoglycemia in detail with the patient. Literature supplied.   2) Diabetic complications:  Eye: Does not have known diabetic retinopathy.  Neuro/ Feet: Does  have known diabetic peripheral neuropathy. Renal: Patient does not have known baseline CKD. She is  on an ACEI/ARB at present.   3) Lipids: No indication for statin  therapy due to age > 3     F/U in 4 weeks      Signed electronically by: Mack Guise, MD  [facility]  [facility] [address] [place], [place] [address] Phone: [phone] FAX: [phone]   CC: Ann Held, DO [address] [place] [address] Phone: [phone]  Fax: [phone]    Return to Endocrinology clinic as below: Future Appointments  Date Time Provider [place]  08/06/2021  1:00 PM Ann Held, DO [facility]

## 2021-06-28 ENCOUNTER — Encounter: Payer: Self-pay | Admitting: *Deleted

## 2021-06-28 NOTE — Progress Notes (Unsigned)
Requested edit on FMLA for daughter completed and faxed to [employer] w/copy to HIM. Notified patient via VM

## 2021-07-03 ENCOUNTER — Other Ambulatory Visit: Payer: Self-pay

## 2021-07-03 ENCOUNTER — Inpatient Hospital Stay: Payer: Medicare Other | Attending: Oncology | Admitting: Oncology

## 2021-07-03 ENCOUNTER — Encounter: Payer: Medicare Other | Admitting: Internal Medicine

## 2021-07-03 VITALS — BP 181/41 | HR 62 | Temp 97.8°F | Resp 18 | Ht 64.0 in | Wt 166.8 lb

## 2021-07-03 DIAGNOSIS — E119 Type 2 diabetes mellitus without complications: Secondary | ICD-10-CM | POA: Diagnosis not present

## 2021-07-03 DIAGNOSIS — K219 Gastro-esophageal reflux disease without esophagitis: Secondary | ICD-10-CM | POA: Diagnosis not present

## 2021-07-03 DIAGNOSIS — I771 Stricture of artery: Secondary | ICD-10-CM | POA: Insufficient documentation

## 2021-07-03 DIAGNOSIS — Z7901 Long term (current) use of anticoagulants: Secondary | ICD-10-CM | POA: Insufficient documentation

## 2021-07-03 DIAGNOSIS — Z79899 Other long term (current) drug therapy: Secondary | ICD-10-CM | POA: Insufficient documentation

## 2021-07-03 DIAGNOSIS — Z86711 Personal history of pulmonary embolism: Secondary | ICD-10-CM | POA: Diagnosis not present

## 2021-07-03 DIAGNOSIS — C159 Malignant neoplasm of esophagus, unspecified: Secondary | ICD-10-CM | POA: Insufficient documentation

## 2021-07-03 DIAGNOSIS — Z8542 Personal history of malignant neoplasm of other parts of uterus: Secondary | ICD-10-CM | POA: Insufficient documentation

## 2021-07-03 DIAGNOSIS — G4733 Obstructive sleep apnea (adult) (pediatric): Secondary | ICD-10-CM | POA: Diagnosis not present

## 2021-07-03 DIAGNOSIS — I10 Essential (primary) hypertension: Secondary | ICD-10-CM | POA: Diagnosis not present

## 2021-07-03 DIAGNOSIS — C154 Malignant neoplasm of middle third of esophagus: Secondary | ICD-10-CM

## 2021-07-03 NOTE — Progress Notes (Signed)
This office visit has been canceled   Patient appointment was virtual at 9:10 AM, I was behind and I was able to get on the call at 945.  Per daughter mother had another appointment and could not wait.  I took a quick look at her BG readings and there was no evidence of hypoglycemia and was advised to continue current antiglycemic regimen.   Per front desk staff, when they call to reschedule the appointment daughter stated that she did not see the need to reschedule at this time.    Abby Nena [name], MD  [facility]  [facility] [address] [place], [place] [address] Phone: [phone] FAX: [phone]

## 2021-07-03 NOTE — Progress Notes (Signed)
[facility] OFFICE PROGRESS NOTE   Diagnosis: Esophagus cancer  INTERVAL HISTORY:   Sherry Holloway underwent an attempt at endoscopic resection of the esophagus tumor at [facility] on 06/28/2021.  A 3 cm fungating mass with no stigmata of bleeding was found in the middle third of the esophagus at 28 cm from the incisors.  The mass was nonobstructing and not circumferential.  Femoral markings were performed in anticipation of endoscopic submucosal resection.  The lesion would not "lift "and resection was unsuccessful.  No tissue was resected.  She had soreness following the procedure.  This has resolved.  No dysphagia.  No other complaint.  Objective:  Vital signs in last 24 hours:  Blood pressure (!) 181/41, pulse 62, temperature 97.8 F (36.6 C), temperature source Oral, resp. rate 18, height 5\' 4"  (1.626 m), weight 166 lb 12.8 oz (75.7 kg), SpO2 100 %.    Lymphatics: No cervical, supraclavicular, or axillary nodes Resp: Lungs clear bilaterally Cardio: Regular rate and rhythm GI: No hepatosplenomegaly, nontender Vascular: No leg edema   Lab Results:  Lab Results  Component Value Date   WBC 6.6 04/20/2021   HGB 11.4 (L) 04/20/2021   HCT 34.9 (L) 04/20/2021   MCV 83.5 04/20/2021   PLT 175.0 04/20/2021   NEUTROABS 4.1 04/20/2021    CMP  Lab Results  Component Value Date   NA 139 04/20/2021   K 4.4 04/20/2021   CL 104 04/20/2021   CO2 29 04/20/2021   GLUCOSE 136 (H) 04/20/2021   BUN 29 (H) 04/20/2021   CREATININE 0.99 04/20/2021   CALCIUM 9.2 04/20/2021   PROT 6.1 04/20/2021   PROT 6.1 04/20/2021   ALBUMIN 3.8 04/20/2021   ALBUMIN 3.8 04/20/2021   AST 10 04/20/2021   AST 10 04/20/2021   ALT 15 04/20/2021   ALT 15 04/20/2021   ALKPHOS 66 04/20/2021   ALKPHOS 66 04/20/2021   BILITOT 0.3 04/20/2021   BILITOT 0.3 04/20/2021   GFRNONAA >60 03/18/2021     Medications: I have reviewed the patient's current medications.   Assessment/Plan: Esophagus  cancer  Upper EUS 03/05/2021-small submucosal ulcerating nodule with no bleeding and no stigmata of recent bleeding in the midesophagus, 29 to 31 cm from the incisors.  Lesion nonobstructing and not circumferential.  Patchy mildly erythematous mucosa without bleeding was found in the gastric body and gastric antrum.  The esophagus lesion was staged T1sm N0 MX.  Pancreatic parenchymal abnormalities consisting of atrophy were noted in the entire pancreas.  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  There was dilation of the common bile duct.  No malignant appearing lymph nodes were visualized in the middle paraesophageal mediastinum, lower paraesophageal mediastinum, celiac region, peripancreatic region and porta hepatis region.  Esophagus biopsy showed invasive carcinoma.  Stomach biopsy showed reactive gastropathy; no intestinal metaplasia, dysplasia or malignancy. PET 04/02/2021- low-level hypermetabolism associated with a 0.7 cm nodule at the mid thoracic esophagus, hypermetabolic cystic pancreas head mass with diffuse intrahepatic and extra fatigability duct dilatation compatible with malignant main duct IPMN, no evidence of metastatic disease CT chest and pelvis 03/16/2021-nonocclusive right lower lobe pulmonary embolus extending into segmental branches of the right lower lobe.  Nodular area at the left lung base with bandlike changes that extend from this area on the current study and on the prior study.  This may represent a  combination of scarring and atelectasis.  Measures 1.4 x 1.2 cm, previously 2.1 x 1.9 cm.  Suggestion of slight interval decrease in size as well of a right lower lobe process with nodular features.  Persistent marked biliary duct distention. CT scan of the abdomen on 01/25/2021 due to history of a pancreatic mass.  The CT showed  gross intra and extrahepatic biliary ductal dilatation, the central common bile duct appeared to contain a poorly calcified calculus or soft tissue nodule measuring approximately 1.9 cm.  There was diffuse atrophy of the pancreatic parenchyma and dilatation of the main pancreatic duct.  There was a hypodense mass or gross pancreatic ductal dilatation within the pancreatic head measuring 3.5 x 2.0 x 2.0 cm.  Nodular appearing consolidation of the dependent left lung base measuring 2.4 x 1.8 cm, nonspecific. ERCP by Dr. Rush Landmark on 03/05/2021.  The major papilla was located entirely within a pantaloon diverticulum.  Prior biliary sphincterotomy appeared open.  Pancreatic duct fishmouth deformity noted.  Filling defects consistent with stones and sludge were seen on the cholangiogram.  The entire main bile duct was severely dilated.  Choledocholithiasis was found.  Biliary sphincteroplasty performed.  Electrohydraulic lithotripsy of a remaining stone performed.   Pulmonary embolus on chest CT 03/15/2021-on Xarelto.  Bilateral lower extremity venous Doppler study with no significant femoral-popliteal DVT.  Limited assessment of the calf veins.  5.7 cm left popliteal fossa Baker's cyst. GERD Hypertension Diabetes OSA Subclavian arterial stenosis History of uterine cancer Chronic back pain maintained on MS Contin, followed by Dr. Nelva Bush IPMN- diagnosed in July 2020 Biliary stones-status post ERCP/sphincterotomy 07/14/2019 for removal of stones      Disposition: Sherry Holloway appears stable.  She has been diagnosed with squamous cell carcinoma of the midesophagus.  She is asymptomatic.  She was referred for endoscopic mucosal resection, but this was unsuccessful.  I discussed the prognosis and treatment options with Sherry Holloway and her daughter.  She is not a surgical candidate.  We discussed palliative radiation.  We also discussed possible concurrent chemotherapy.  She would like to preserve her quality  of life as much as possible.  We also discussed comfort care.  I will make referral to Dr. Lisbeth Renshaw and present her case at the GI tumor conference.  She will return for an office visit and further discussion on 07/16/2021.  We will submit the esophagus biopsy material for PD1 testing.   She continues anticoagulation therapy for treatment of the pulmonary embolism diagnosed on 03/15/2021.  We discussed discontinuing anticoagulation therapy after 6 months versus continuing anticoagulation therapy in the setting of esophagus cancer. Betsy Coder, MD  07/03/2021  3:45 PM

## 2021-07-04 ENCOUNTER — Telehealth: Payer: Self-pay | Admitting: *Deleted

## 2021-07-04 NOTE — Telephone Encounter (Signed)
Discussed w/daughter moving OV with Dr. Benay Spice to after her visit with Dr. Lisbeth Renshaw on 8/2. Daughter prefers to cancel 8/1 and once the plan of care is established is willing to see Dr. Benay Spice again. Having difficulty getting off work to bring her to appointments. Made aware of [facility] assistance and RN will determine if the service is able to walk her to and from car to home/facility.  Notified GI Navigator to add her to GI Conference on 7/27.

## 2021-07-11 ENCOUNTER — Other Ambulatory Visit: Payer: Self-pay

## 2021-07-11 NOTE — Progress Notes (Signed)
The proposed treatment discussed in conference is for discussion purpose only and is not a binding recommendation.  The patients have not been physically examined, or presented with their treatment options.  Therefore, final treatment plans cannot be decided.  

## 2021-07-16 ENCOUNTER — Ambulatory Visit: Payer: Medicare Other | Admitting: Oncology

## 2021-07-16 ENCOUNTER — Encounter: Payer: Self-pay | Admitting: Family Medicine

## 2021-07-16 MED ORDER — RIVAROXABAN 20 MG PO TABS: 20.0000 mg | ORAL_TABLET | Freq: Every day | ORAL | 0 refills | Status: DC

## 2021-07-16 NOTE — Progress Notes (Signed)
GI Location of Tumor / Histology: Middle Third Esophagus  Patient was seen at [facility].  She was noted to have a 3 cm fungating mass with not stigmata of bleeding found in the middle third of the esophagus at 28 cm from the incisors.  The mass was non-obstructing and not circumferential.  Biopsies of Esophageal Mass 03/05/2021    Past/Anticipated interventions by surgeon, if any:   Past/Anticipated interventions by medical oncology, if any:  Dr. Benay Spice 07/03/2021 - She has been diagnosed with squamous cell carcinoma of the midesophagus.  She is asymptomatic.   -She was referred for endoscopic mucosal resection, but this was unsuccessful.   -I discussed the prognosis and treatment options with Sherry Holloway and her daughter.   -She is not a surgical candidate.   -We discussed palliative radiation.  We also discussed possible concurrent chemotherapy.   -She would like to preserve her quality of life as much as possible.  We also discussed comfort care. -I will make referral to Dr. Lisbeth Renshaw and present her case at the GI tumor conference. -Follow-up unscheduled at this time.   Weight changes, if any: Stable, fluctuates a few pounds.  Bowel/Bladder complaints, if any: no changes  Nausea / Vomiting, if any: None  Pain issues, if any:  Feels "rubbing sensation" occasionally when swallowing certain things.  Diet: Has not made any changes to the types of foods she eats.  SAFETY ISSUES: Prior radiation? No Pacemaker/ICD? None Possible current pregnancy? Hysterectomy Is the patient on methotrexate? No   Current Complaints/Details: -Lower right quadrant, spinal cord stimulator, it is dead.

## 2021-07-17 ENCOUNTER — Ambulatory Visit
Admission: RE | Admit: 2021-07-17 | Discharge: 2021-07-17 | Disposition: A | Discharge status: Home / Self Care | Payer: Medicare Other | Source: Ambulatory Visit | Attending: Radiation Oncology | Admitting: Radiation Oncology

## 2021-07-17 ENCOUNTER — Other Ambulatory Visit: Payer: Self-pay

## 2021-07-17 ENCOUNTER — Encounter: Payer: Self-pay | Admitting: Radiation Oncology

## 2021-07-17 VITALS — Ht 64.0 in | Wt 164.0 lb

## 2021-07-17 DIAGNOSIS — C154 Malignant neoplasm of middle third of esophagus: Secondary | ICD-10-CM

## 2021-07-17 HISTORY — DX: Malignant neoplasm of esophagus, unspecified: C15.9

## 2021-07-19 ENCOUNTER — Telehealth: Payer: Self-pay | Admitting: Oncology

## 2021-07-19 NOTE — Progress Notes (Signed)
Radiation Oncology         [phone] ________________________________  Name: Sherry Holloway MRN: [id number]  Date: 07/17/2021  DOB: 01/11/1928  [id number] [name], Sherry Apa, DO  Ladell Pier, MD     REFERRING PHYSICIAN: Ladell Pier, MD   DIAGNOSIS: The encounter diagnosis was Cancer of middle third of esophagus ([place]).   HISTORY OF PRESENT ILLNESS::Sherry Holloway is a 87 y.o. female who is seen for an initial consultation visit regarding the patient's diagnosis of squamous cell carcinoma of the esophagus.  The patient has been found to have a focal tumor within the mid esophagus.  PET scan showed a small hypermetabolic focus at this location without significant additional activity elsewhere.  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  The patient was considered for an endoscopic resection of the esophageal tumor but this was unable to be successfully completed.  Therefore, I have been asked to see the patient for consideration of radiation treatment to this lesion.    PREVIOUS RADIATION THERAPY: No   PAST MEDICAL HISTORY:  has a past medical history of Arthritis, Chicken pox, Diabetes mellitus without complication ([place]), Diverticulitis, Esophageal cancer ([place]) (dx'd 2020), Esophagus cancer ([place]), GERD (gastroesophageal reflux disease), Hyperlipidemia, Hypertension, OSA (obstructive sleep apnea), Urine incontinence, and Uterine cancer ([place]).     PAST SURGICAL HISTORY: Past Surgical History:  Procedure Laterality Date   ABDOMINAL HYSTERECTOMY  1972   APPENDECTOMY     BILIARY DILATION  03/05/2021   Procedure: BILIARY DILATION;  Surgeon: Rush Landmark Telford Nab., MD;  Location: [facility];  Service: Gastroenterology;;   BIOPSY  03/05/2021   Procedure: BIOPSY;  Surgeon: Irving Copas., MD;  Location: [facility];  Service: Gastroenterology;;   CHOLECYSTECTOMY     ENDOSCOPIC RETROGRADE CHOLANGIOPANCREATOGRAPHY (ERCP) WITH PROPOFOL N/A 03/05/2021   Procedure: ENDOSCOPIC RETROGRADE CHOLANGIOPANCREATOGRAPHY (ERCP) WITH PROPOFOL;  Surgeon: Irving Copas., MD;  Location: [facility];  Service: Gastroenterology;  Laterality: N/A;   ESOPHAGOGASTRODUODENOSCOPY (EGD) WITH PROPOFOL N/A 03/05/2021   Procedure: ESOPHAGOGASTRODUODENOSCOPY (EGD) WITH PROPOFOL;  Surgeon: Rush Landmark Telford Nab., MD;  Location: [facility];  Service: Gastroenterology;  Laterality: N/A;   EUS N/A 03/05/2021   Procedure: UPPER ENDOSCOPIC ULTRASOUND (EUS) RADIAL;  Surgeon: Irving Copas., MD;  Location: [facility];  Service: Gastroenterology;  Laterality: N/A;   LUMBAR LAMINECTOMY     REMOVAL OF STONES  03/05/2021   Procedure: REMOVAL OF STONES;  Surgeon: Rush Landmark Telford Nab., MD;  Location: [facility];  Service: Gastroenterology;;   SPINAL CORD STIMULATOR IMPLANT     SPYGLASS CHOLANGIOSCOPY N/A 03/05/2021   Procedure: [id number] CHOLANGIOSCOPY;  Surgeon: Irving Copas., MD;  Location: [facility];  Service: Gastroenterology;  Laterality: N/A;   SPYGLASS LITHOTRIPSY N/A 03/05/2021   Procedure: [id number] LITHOTRIPSY;  Surgeon: Irving Copas., MD;  Location: [facility];  Service: Gastroenterology;  Laterality: N/A;   TONSILLECTOMY     TOTAL KNEE ARTHROPLASTY Right      FAMILY HISTORY: family history includes Diabetes in her daughter, mother, and son; Heart disease in her father; Hypertension in her daughter, daughter, son, and son; Lung cancer in her sister and son.   SOCIAL HISTORY:  reports that she quit smoking about 32 years ago. Her smoking use included cigarettes. She has never used smokeless tobacco. She reports previous alcohol use. She  reports that she does not use drugs.   ALLERGIES: Gramicidin, Sulfa antibiotics, and Wound dressing  adhesive   MEDICATIONS:  Current Outpatient Medications  Medication Sig Dispense Refill   acetaminophen (TYLENOL) 325 MG tablet Take 352-650 mg by mouth every 6 (six) hours as needed for mild pain or moderate pain.     Apoaequorin 10 MG CAPS Take 10 mg by mouth daily. 30 capsule 5   bumetanide (BUMEX) 1 MG tablet Take 1 tablet (1 mg total) by mouth daily. Take 1/2 to 1 tablet daily for swelling 90 tablet 1   diclofenac Sodium (VOLTAREN) 1 % GEL Apply topically.     docusate sodium (COLACE) 100 MG capsule Take 1 capsule (100 mg total) by mouth 2 (two) times daily. 10 capsule 0   famciclovir (FAMVIR) 125 MG tablet Take 1 tablet (125 mg total) by mouth daily. 90 tablet 3   glimepiride (AMARYL) 2 MG tablet Take 2 mg by mouth daily with supper.     glimepiride (AMARYL) 4 MG tablet Take 1 tablet (4 mg total) by mouth daily before breakfast. 90 tablet 1   glucose blood test strip Check blood sugars twice daily 200 each 12   latanoprost (XALATAN) 0.005 % ophthalmic solution Place 1 drop into both eyes at bedtime.     lipase/protease/amylase (CREON) 36000 UNITS CPEP capsule Take 1 capsule (36,000 Units total) by mouth 3 (three) times daily before meals. (Patient taking differently: Take 36,000 Units by mouth 2 (two) times daily.) 270 capsule 1   mirtazapine (REMERON) 15 MG tablet Take 1 tablet (15 mg total) by mouth at bedtime. (Patient taking differently: Take 15 mg by mouth daily with supper.) 30 tablet 0   morphine (MS CONTIN) 30 MG 12 hr tablet Take 30 mg by mouth every 12 (twelve) hours.     Multiple Vitamins-Minerals (WOMENS 50+ MULTI VITAMIN/MIN PO) Take 1 tablet by mouth daily with supper.     omeprazole (PRILOSEC) 20 MG capsule Take 1 capsule (20 mg total) by mouth 2 (two) times daily before a meal. 180 capsule 3   rivaroxaban (XARELTO) 20 MG TABS tablet Take 1 tablet (20 mg total) by mouth daily with supper. Please start only after the starter pack has been completed. 90 tablet 0   valsartan  (DIOVAN) 40 MG tablet Take 40 mg by mouth daily with breakfast.     No current facility-administered medications for this encounter.     REVIEW OF SYSTEMS:  A 15 point review of systems is documented in the electronic medical record. This was obtained by the nursing staff. However, I reviewed this with the patient to discuss relevant findings and make appropriate changes.  Pertinent items are noted in HPI.    PHYSICAL EXAM:  height is '5\' 4"'$  (1.626 m) and weight is 164 lb (74.4 kg).   ECOG = 1  0 - Asymptomatic (Fully active, able to carry on all predisease activities without restriction)  1 - Symptomatic but completely ambulatory (Restricted in physically strenuous activity but ambulatory and able to carry out work of a light or sedentary nature. For example, light housework, office work)  2 - Symptomatic, <50% in bed during the day (Ambulatory and capable of all self care but unable to carry out any work activities. Up and about more than 50% of waking hours)  3 - Symptomatic, >50% in bed, but not bedbound (Capable of only limited self-care, confined to bed or chair 50% or more of waking hours)  4 - Bedbound (Completely  disabled. Cannot carry on any self-care. Totally confined to bed or chair)  5 - Death   Eustace Pen MM, Creech RH, Tormey DC, et al. [phone]). "Toxicity and response criteria of the [facility]". [place] Oncol. 5 (6): 649-55  Alert, no distress   LABORATORY DATA:  Lab Results  Component Value Date   WBC 6.6 04/20/2021   HGB 11.4 (L) 04/20/2021   HCT 34.9 (L) 04/20/2021   MCV 83.5 04/20/2021   PLT 175.0 04/20/2021   Lab Results  Component Value Date   NA 139 04/20/2021   K 4.4 04/20/2021   CL 104 04/20/2021   CO2 29 04/20/2021   Lab Results  Component Value Date   ALT 15 04/20/2021   ALT 15 04/20/2021   AST 10 04/20/2021   AST 10 04/20/2021   ALKPHOS 66 04/20/2021   ALKPHOS 66 04/20/2021   BILITOT 0.3 04/20/2021   BILITOT 0.3  04/20/2021      RADIOGRAPHY: No results found.     IMPRESSION/ PLAN:  The patient has been diagnosed with a T1 N0 M0 squamous cell carcinoma of the midesophagus.  She has not been able to complete a successful endoscopic resection and I have been asked to see the patient for radiation treatment as an alternative.  I discussed with the patient various options pertaining to radiation treatment including a longer 5-1/2-week course of treatment, a more palliative 3-week course of treatment, and observation.  The patient is doing reasonably well at this time in terms of swallowing.  The patient's histology was squamous cell carcinoma also is favorable in terms of potential response to radiation treatment.  We had a detailed discussion about the pros and cons of various approaches.  All of her questions were answered.  After this discussion, the patient and her family indicated that she wished to be aggressive in her management, knowing that she will be monitored during treatment and certainly this plan can be altered if necessary.  The patient is also interested in potential sensitizing concurrent chemotherapy if feasible.  I will reach out to medical oncology to further discuss this.  The patient will proceed with simulation in the near future for treatment planning.  Given current concerns for patient exposure during the COVID-19 pandemic, this encounter was conducted via telephone.  The patient has given verbal consent for this type of encounter. The time spent during this encounter was 50 minutes and more than 50% of that time was spent in the coordination of the patient's care. The attendants for this meeting included Dr. Lisbeth Renshaw, the patient and her daughter Sherry Holloway. During the encounter Dr. Lisbeth Renshaw was located at [facility] Radiation Oncology Department.  The patient  was located at home.        ________________________________   Jodelle Gross, MD, PhD   **Disclaimer:  This note was dictated with voice recognition software. Similar sounding words can inadvertently be transcribed and this note may contain transcription errors which may not have been corrected upon publication of note.**

## 2021-07-19 NOTE — Telephone Encounter (Signed)
Scheduled appt per 8/4 sch msg - left message for patient with appt date and time

## 2021-07-20 ENCOUNTER — Ambulatory Visit
Admission: RE | Admit: 2021-07-20 | Discharge: 2021-07-20 | Disposition: A | Discharge status: Home / Self Care | Payer: Medicare Other | Source: Ambulatory Visit | Attending: Radiation Oncology | Admitting: Radiation Oncology

## 2021-07-20 ENCOUNTER — Other Ambulatory Visit: Payer: Self-pay

## 2021-07-20 DIAGNOSIS — Z510 Encounter for antineoplastic radiation therapy: Secondary | ICD-10-CM | POA: Insufficient documentation

## 2021-07-20 DIAGNOSIS — C154 Malignant neoplasm of middle third of esophagus: Secondary | ICD-10-CM | POA: Diagnosis not present

## 2021-07-20 DIAGNOSIS — G4733 Obstructive sleep apnea (adult) (pediatric): Secondary | ICD-10-CM | POA: Diagnosis not present

## 2021-07-20 DIAGNOSIS — I771 Stricture of artery: Secondary | ICD-10-CM | POA: Diagnosis not present

## 2021-07-20 DIAGNOSIS — Z79899 Other long term (current) drug therapy: Secondary | ICD-10-CM | POA: Diagnosis not present

## 2021-07-20 DIAGNOSIS — K219 Gastro-esophageal reflux disease without esophagitis: Secondary | ICD-10-CM | POA: Diagnosis not present

## 2021-07-20 DIAGNOSIS — C159 Malignant neoplasm of esophagus, unspecified: Secondary | ICD-10-CM | POA: Diagnosis present

## 2021-07-20 DIAGNOSIS — M549 Dorsalgia, unspecified: Secondary | ICD-10-CM | POA: Diagnosis not present

## 2021-07-20 DIAGNOSIS — I10 Essential (primary) hypertension: Secondary | ICD-10-CM | POA: Diagnosis not present

## 2021-07-20 DIAGNOSIS — E119 Type 2 diabetes mellitus without complications: Secondary | ICD-10-CM | POA: Diagnosis not present

## 2021-07-20 DIAGNOSIS — G8929 Other chronic pain: Secondary | ICD-10-CM | POA: Diagnosis not present

## 2021-07-23 ENCOUNTER — Inpatient Hospital Stay: Payer: Medicare Other

## 2021-07-23 ENCOUNTER — Ambulatory Visit (HOSPITAL_BASED_OUTPATIENT_CLINIC_OR_DEPARTMENT_OTHER)
Admission: RE | Admit: 2021-07-23 | Discharge: 2021-07-23 | Disposition: A | Discharge status: Home / Self Care | Payer: Medicare Other | Source: Ambulatory Visit | Attending: Nurse Practitioner | Admitting: Nurse Practitioner

## 2021-07-23 ENCOUNTER — Inpatient Hospital Stay: Payer: Medicare Other | Attending: Oncology | Admitting: Nurse Practitioner

## 2021-07-23 ENCOUNTER — Encounter: Payer: Self-pay | Admitting: Nurse Practitioner

## 2021-07-23 ENCOUNTER — Other Ambulatory Visit: Payer: Self-pay

## 2021-07-23 VITALS — BP 160/80 | HR 70 | Temp 97.8°F | Resp 20 | Ht 64.0 in | Wt 165.0 lb

## 2021-07-23 DIAGNOSIS — C154 Malignant neoplasm of middle third of esophagus: Secondary | ICD-10-CM

## 2021-07-23 DIAGNOSIS — K219 Gastro-esophageal reflux disease without esophagitis: Secondary | ICD-10-CM | POA: Insufficient documentation

## 2021-07-23 DIAGNOSIS — G4733 Obstructive sleep apnea (adult) (pediatric): Secondary | ICD-10-CM | POA: Insufficient documentation

## 2021-07-23 DIAGNOSIS — I10 Essential (primary) hypertension: Secondary | ICD-10-CM | POA: Insufficient documentation

## 2021-07-23 DIAGNOSIS — G8929 Other chronic pain: Secondary | ICD-10-CM | POA: Insufficient documentation

## 2021-07-23 DIAGNOSIS — C159 Malignant neoplasm of esophagus, unspecified: Secondary | ICD-10-CM | POA: Insufficient documentation

## 2021-07-23 DIAGNOSIS — Z510 Encounter for antineoplastic radiation therapy: Secondary | ICD-10-CM | POA: Diagnosis not present

## 2021-07-23 DIAGNOSIS — Z79899 Other long term (current) drug therapy: Secondary | ICD-10-CM | POA: Insufficient documentation

## 2021-07-23 DIAGNOSIS — M549 Dorsalgia, unspecified: Secondary | ICD-10-CM | POA: Insufficient documentation

## 2021-07-23 DIAGNOSIS — E119 Type 2 diabetes mellitus without complications: Secondary | ICD-10-CM | POA: Insufficient documentation

## 2021-07-23 DIAGNOSIS — I771 Stricture of artery: Secondary | ICD-10-CM | POA: Insufficient documentation

## 2021-07-23 LAB — CBC WITH DIFFERENTIAL (CANCER CENTER ONLY)
Abs Immature Granulocytes: 0.03 10*3/uL (ref 0.00–0.07)
Basophils Absolute: 0 10*3/uL (ref 0.0–0.1)
Basophils Relative: 1 %
Eosinophils Absolute: 0.1 10*3/uL (ref 0.0–0.5)
Eosinophils Relative: 1 %
HCT: 39.2 % (ref 36.0–46.0)
Hemoglobin: 12.3 g/dL (ref 12.0–15.0)
Immature Granulocytes: 0 %
Lymphocytes Relative: 26 %
Lymphs Abs: 2.2 10*3/uL (ref 0.7–4.0)
MCH: 27.6 pg (ref 26.0–34.0)
MCHC: 31.4 g/dL (ref 30.0–36.0)
MCV: 87.9 fL (ref 80.0–100.0)
Monocytes Absolute: 0.4 10*3/uL (ref 0.1–1.0)
Monocytes Relative: 5 %
Neutro Abs: 5.6 10*3/uL (ref 1.7–7.7)
Neutrophils Relative %: 67 %
Platelet Count: 197 10*3/uL (ref 150–400)
RBC: 4.46 MIL/uL (ref 3.87–5.11)
RDW: 14.1 % (ref 11.5–15.5)
WBC Count: 8.3 10*3/uL (ref 4.0–10.5)
nRBC: 0 % (ref 0.0–0.2)

## 2021-07-23 LAB — CMP (CANCER CENTER ONLY)
ALT: 18 U/L (ref 0–44)
AST: 11 U/L — ABNORMAL LOW (ref 15–41)
Albumin: 3.8 g/dL (ref 3.5–5.0)
Alkaline Phosphatase: 66 U/L (ref 38–126)
Anion gap: 6 (ref 5–15)
BUN: 24 mg/dL — ABNORMAL HIGH (ref 8–23)
CO2: 28 mmol/L (ref 22–32)
Calcium: 9.8 mg/dL (ref 8.9–10.3)
Chloride: 103 mmol/L (ref 98–111)
Creatinine: 0.98 mg/dL (ref 0.44–1.00)
GFR, Estimated: 54 mL/min — ABNORMAL LOW (ref >60)
Glucose, Bld: 188 mg/dL — ABNORMAL HIGH (ref 70–99)
Potassium: 4.7 mmol/L (ref 3.5–5.1)
Sodium: 137 mmol/L (ref 135–145)
Total Bilirubin: 0.3 mg/dL (ref 0.3–1.2)
Total Protein: 6.8 g/dL (ref 6.5–8.1)

## 2021-07-23 MED ORDER — CAPECITABINE 500 MG PO TABS
ORAL_TABLET | ORAL | 0 refills | Status: DC
  Filled 2021-07-23: qty 140, 28d supply, fill #0

## 2021-07-23 NOTE — Progress Notes (Signed)
[place] OFFICE PROGRESS NOTE   Diagnosis: Esophagus cancer  INTERVAL HISTORY:   Sherry Holloway returns for follow-up.  She has seen Dr. Lisbeth Renshaw.  The plan is to proceed with radiation/Xeloda.  She is scheduled to begin radiation 07/26/2021.  She denies nausea/vomiting.  No dysphagia.  She is tolerating regular diet.  No constipation or diarrhea.  She fell 2 days ago, lost balance while changing close.  Landed on her right side.  Her daughter notes a bruise with marked associated discomfort at the right lateral upper leg.  She feels the right knee has shifted.  Objective:  Vital signs in last 24 hours:  Blood pressure (!) 160/80, pulse 70, temperature 97.8 F (36.6 C), temperature source Oral, resp. rate 20, height '5\' 4"'$  (1.626 m), weight 165 lb (74.8 kg), SpO2 96 %.    Resp: Lungs clear bilaterally. Cardio: Regular rate and rhythm. GI: Abdomen soft and nontender. Vascular: Trace edema lower leg bilaterally.  Right lower leg is slightly larger than the left lower leg. Musculoskeletal: Question seroma medial aspect right knee. Skin: Ecchymosis lateral upper right leg, marked associated tenderness.   Lab Results:  Lab Results  Component Value Date   WBC 6.6 04/20/2021   HGB 11.4 (L) 04/20/2021   HCT 34.9 (L) 04/20/2021   MCV 83.5 04/20/2021   PLT 175.0 04/20/2021   NEUTROABS 4.1 04/20/2021    Imaging:  No results found.  Medications: I have reviewed the patient's current medications.  Assessment/Plan: Esophagus cancer Upper EUS 03/05/2021-small submucosal ulcerating nodule with no bleeding and no stigmata of recent bleeding in the midesophagus, 29 to 31 cm from the incisors.  Lesion nonobstructing and not circumferential.  Patchy mildly erythematous mucosa without bleeding was found in the gastric body and gastric antrum.  The esophagus lesion was staged T1sm N0 MX.  Pancreatic parenchymal abnormalities consisting of atrophy were noted in the entire pancreas.   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  There was dilation of the common bile duct.  No malignant appearing lymph nodes were visualized in the middle paraesophageal mediastinum, lower paraesophageal mediastinum, celiac region, peripancreatic region and porta hepatis region.  Esophagus biopsy showed invasive carcinoma.  Stomach biopsy showed reactive gastropathy; no intestinal metaplasia, dysplasia or malignancy. PET 04/02/2021- low-level hypermetabolism associated with a 0.7 cm nodule at the mid thoracic esophagus, hypermetabolic cystic pancreas head mass with diffuse intrahepatic and extra fatigability duct dilatation compatible with malignant main duct IPMN, no evidence of metastatic disease CT chest and pelvis 03/16/2021-nonocclusive right lower lobe pulmonary embolus extending into segmental branches of the right lower lobe.  Nodular area at the left lung base with bandlike changes that extend from this area on the current study and on the prior study.  This may represent a combination of scarring and atelectasis.  Measures 1.4 x 1.2 cm, previously 2.1 x 1.9 cm.  Suggestion of slight interval decrease in size as well of a right lower lobe process with nodular features.  Persistent marked biliary duct distention. Radiation/Xeloda 07/26/2021 CT scan of the abdomen on 01/25/2021 due to history of a pancreatic mass.  The CT showed gross intra and extrahepatic biliary ductal dilatation, the central common bile duct appeared to contain a poorly calcified calculus or soft tissue nodule measuring approximately 1.9 cm.  There was diffuse atrophy of the pancreatic parenchyma and dilatation of the  main pancreatic duct.  There was a hypodense mass or gross pancreatic ductal dilatation within the pancreatic head measuring 3.5 x 2.0 x 2.0 cm.  Nodular appearing  consolidation of the dependent left lung base measuring 2.4 x 1.8 cm, nonspecific. ERCP by Dr. Rush Landmark on 03/05/2021.  The major papilla was located entirely within a pantaloon diverticulum.  Prior biliary sphincterotomy appeared open.  Pancreatic duct fishmouth deformity noted.  Filling defects consistent with stones and sludge were seen on the cholangiogram.  The entire main bile duct was severely dilated.  Choledocholithiasis was found.  Biliary sphincteroplasty performed.  Electrohydraulic lithotripsy of a remaining stone performed.   Pulmonary embolus on chest CT 03/15/2021-on Xarelto.  Bilateral lower extremity venous Doppler study with no significant femoral-popliteal DVT.  Limited assessment of the calf veins.  5.7 cm left popliteal fossa Baker's cyst. GERD Hypertension Diabetes OSA Subclavian arterial stenosis History of uterine cancer Chronic back pain maintained on MS Contin, followed by Dr. Nelva Bush IPMN- diagnosed in July 2020 Biliary stones-status post ERCP/sphincterotomy 07/14/2019 for removal of stones      Disposition: Sherry Holloway appears stable.  She has squamous cell carcinoma of the midesophagus.  She is asymptomatic.  She has seen Dr. Lisbeth Renshaw with the recommendation for radiation/Xeloda.  We reviewed potential toxicities associated with Xeloda including bone marrow toxicity, nausea, mouth sores, hair loss, diarrhea, hand-foot syndrome, skin hyperpigmentation, skin rash, increased sensitivity to sun.  She agrees to proceed.  She is scheduled to begin radiation 07/26/2021.  We will obtain baseline labs today.  She had a recent fall.  We are referring her for x-rays of the right hip/femur/knee.  She will return for lab and follow-up 08/09/2021.  She will contact the office in the interim with any problems.  Patient seen with Dr. Benay Spice.    Ned Card ANP/GNP-BC   07/23/2021  11:37 AM  This was a shared visit with Ned Card.  Sherry Holloway was interviewed and examined.   She saw Dr. Lisbeth Renshaw.  He recommends concurrent capecitabine and radiation.  I communicated with Dr. Lisbeth Renshaw.  We reviewed potential toxicities associated with capecitabine.  She would like to proceed with a course of capecitabine and radiation.  We will refer her for x-rays to evaluate the right thigh pain and ecchymosis.  I was present for greater than 50% of today's visit.  I performed medical decision making.  Julieanne Manson, MD

## 2021-07-24 ENCOUNTER — Telehealth: Payer: Self-pay | Admitting: Pharmacist

## 2021-07-24 ENCOUNTER — Telehealth: Payer: Self-pay | Admitting: Pharmacy Technician

## 2021-07-24 ENCOUNTER — Other Ambulatory Visit (HOSPITAL_COMMUNITY): Payer: Self-pay

## 2021-07-24 DIAGNOSIS — C154 Malignant neoplasm of middle third of esophagus: Secondary | ICD-10-CM

## 2021-07-24 MED ORDER — CAPECITABINE 500 MG PO TABS
ORAL_TABLET | ORAL | 0 refills | Status: DC
  Filled ????-??-??: fill #0

## 2021-07-24 NOTE — Telephone Encounter (Signed)
Oral Oncology Pharmacist Encounter  Received new prescription for Xeloda (capecitabine) for the treatment of esophageal cancer in conjunction with XRT, planned duration until the end of radiation. Planned start 07/26/21.  CMP from 07/23/21 assessed, no relevant lab abnormalities. Patient's CrCl is reduced at ~64m/min., provider has reduced the capecitabine dose. Prescription dose and frequency assessed.   Current medication list in Epic reviewed, one DDIs with capecitabine identified: - Omeprazole: Proton Pump Inhibitors (PPI) may diminish the therapeutic effect of capecitabine, varying information on the clinical impact. Recommend evaluating the need for a PPI/acid suppression. If acid suppression is needed, attempt switching to a H2 antagonist (eg, famotidine) if possible.  Evaluated chart and no patient barriers to medication adherence identified.   [facility].  Oral Oncology Clinic will continue to follow for insurance authorization, copayment issues, initial counseling and start date.   ADarl Pikes PharmD, BCPS, BCOP, CPP Hematology/Oncology Clinical Pharmacist Practitioner [phone] 07/24/2021 3:22 PM

## 2021-07-24 NOTE — Telephone Encounter (Signed)
Oral Chemotherapy Pharmacist Encounter  Shaquana Gunderman's daughter Cline Crock will pick up her Xeloda from [place] on Wednesday 07/25/21. Ms. Fouty will get started on Thursday along with her first day of radiation.  Patient Education I spoke with Jodette for overview of new oral chemotherapy medication:Xeloda (capecitabine) for the treatment of esophageal cancer in conjunction with XRT, planned duration until the end of radiation. Planned start 07/26/21.  Counseled Jodette on administration, dosing, side effects, monitoring, drug-food interactions, safe handling, storage, and disposal. Patient will take Take 3 tablets ('1500mg'$ ) by mouth in AM and  2 tablets ('1000mg'$ ) in PM. Take with food. Take Monday- Friday.Take only on days of radiation..  Side effects include but not limited to: diarrhea, hand-foot syndrome, mouth sores, edema, decreased wbc, fatigue, N/V.    Reviewed with Jodette importance of keeping a medication schedule and plan for any missed doses.  After discussion with Jodette no patient barriers to medication adherence identified.   Jodette voiced understanding and appreciation. All questions answered. Medication handout provided.  Provided Jodette with Oral Chemotherapy Navigation Clinic phone number. Jodette knows to call the office with questions or concerns. Oral Chemotherapy Navigation Clinic will continue to follow.  Darl Pikes, PharmD, BCPS, BCOP, CPP Hematology/Oncology Clinical Pharmacist Practitioner [facility]/AP [facility] [phone]  07/24/2021 4:47 PM

## 2021-07-24 NOTE — Telephone Encounter (Signed)
Oral Oncology Patient Advocate Encounter  After completing a benefits investigation, prior authorization for Xeloda is not required at this time through [facility].  Patient's copay is $0.00 (14 day supply's required by [employer]).  [place] [facility] [place] Phone [phone] Fax [phone] 07/24/2021 3:02 PM

## 2021-07-25 ENCOUNTER — Other Ambulatory Visit (HOSPITAL_COMMUNITY): Payer: Self-pay

## 2021-07-25 MED ORDER — CAPECITABINE 500 MG PO TABS
ORAL_TABLET | ORAL | 0 refills | Status: DC
  Filled 2021-07-25 (×2): qty 50, 14d supply, fill #0
  Filled 2021-08-03: qty 50, 14d supply, fill #1
  Filled 2021-08-14: qty 40, 8d supply, fill #2

## 2021-07-25 NOTE — Addendum Note (Signed)
Addended by: Darl Pikes on: 07/25/2021 10:34 AM   Modules accepted: Orders

## 2021-07-26 ENCOUNTER — Ambulatory Visit
Admission: RE | Admit: 2021-07-26 | Discharge: 2021-07-26 | Disposition: A | Discharge status: Home / Self Care | Payer: Medicare Other | Source: Ambulatory Visit | Attending: Radiation Oncology | Admitting: Radiation Oncology

## 2021-07-26 ENCOUNTER — Telehealth: Payer: Self-pay

## 2021-07-26 ENCOUNTER — Other Ambulatory Visit: Payer: Self-pay

## 2021-07-26 DIAGNOSIS — Z510 Encounter for antineoplastic radiation therapy: Secondary | ICD-10-CM | POA: Diagnosis not present

## 2021-07-26 DIAGNOSIS — C154 Malignant neoplasm of middle third of esophagus: Secondary | ICD-10-CM

## 2021-07-26 MED ORDER — SONAFINE EX EMUL
1.0000 "application " | Freq: Once | CUTANEOUS | Status: AC
  Administered 2021-07-26: 1 via TOPICAL

## 2021-07-26 NOTE — Telephone Encounter (Signed)
Called spoke with daughter Jodette(POA) made her aware of results for most recent Xray no questions or concerns voiced nothing further call ended

## 2021-07-26 NOTE — Progress Notes (Signed)
Pt here for patient teaching.  Pt given Radiation and You booklet, skin care instructions, and Sonafine.  Reviewed areas of pertinence such as fatigue, hair loss, skin changes, throat changes, cough, and shortness of breath . Pt able to give teach back of to pat skin and use unscented/gentle soap,apply Sonafine bid and avoid applying anything to skin within 4 hours of treatment. Pt verbalizes understanding of information given and will contact nursing with any questions or concerns.     Http://rtanswers.org/treatmentinformation/whattoexpect/index  Hiliana Eilts M. Parlee Amescua RN, BSN       

## 2021-07-26 NOTE — Telephone Encounter (Signed)
-----   Message from Owens Shark, NP sent at 07/26/2021 12:24 PM EDT ----- Please let her know the x-rays returned negative for fracture.  There were areas of soft tissue swelling distal medial thigh and above the knee joint.

## 2021-07-27 ENCOUNTER — Ambulatory Visit
Admission: RE | Admit: 2021-07-27 | Discharge: 2021-07-27 | Disposition: A | Discharge status: Home / Self Care | Payer: Medicare Other | Source: Ambulatory Visit | Attending: Radiation Oncology | Admitting: Radiation Oncology

## 2021-07-27 DIAGNOSIS — Z510 Encounter for antineoplastic radiation therapy: Secondary | ICD-10-CM | POA: Diagnosis not present

## 2021-07-30 ENCOUNTER — Other Ambulatory Visit: Payer: Self-pay

## 2021-07-30 ENCOUNTER — Ambulatory Visit
Admission: RE | Admit: 2021-07-30 | Discharge: 2021-07-30 | Disposition: A | Discharge status: Home / Self Care | Payer: Medicare Other | Source: Ambulatory Visit | Attending: Radiation Oncology | Admitting: Radiation Oncology

## 2021-07-30 DIAGNOSIS — Z510 Encounter for antineoplastic radiation therapy: Secondary | ICD-10-CM | POA: Diagnosis not present

## 2021-07-31 ENCOUNTER — Ambulatory Visit
Admission: RE | Admit: 2021-07-31 | Discharge: 2021-07-31 | Disposition: A | Discharge status: Home / Self Care | Payer: Medicare Other | Source: Ambulatory Visit | Attending: Radiation Oncology | Admitting: Radiation Oncology

## 2021-07-31 DIAGNOSIS — Z510 Encounter for antineoplastic radiation therapy: Secondary | ICD-10-CM | POA: Diagnosis not present

## 2021-08-01 ENCOUNTER — Ambulatory Visit
Admission: RE | Admit: 2021-08-01 | Discharge: 2021-08-01 | Disposition: A | Discharge status: Home / Self Care | Payer: Medicare Other | Source: Ambulatory Visit | Attending: Radiation Oncology | Admitting: Radiation Oncology

## 2021-08-01 ENCOUNTER — Other Ambulatory Visit: Payer: Self-pay

## 2021-08-01 DIAGNOSIS — Z510 Encounter for antineoplastic radiation therapy: Secondary | ICD-10-CM | POA: Diagnosis not present

## 2021-08-02 ENCOUNTER — Ambulatory Visit
Admission: RE | Admit: 2021-08-02 | Discharge: 2021-08-02 | Disposition: A | Discharge status: Home / Self Care | Payer: Medicare Other | Source: Ambulatory Visit | Attending: Radiation Oncology | Admitting: Radiation Oncology

## 2021-08-02 ENCOUNTER — Other Ambulatory Visit: Payer: Self-pay

## 2021-08-02 ENCOUNTER — Other Ambulatory Visit (HOSPITAL_COMMUNITY): Payer: Self-pay

## 2021-08-02 DIAGNOSIS — Z510 Encounter for antineoplastic radiation therapy: Secondary | ICD-10-CM | POA: Diagnosis not present

## 2021-08-03 ENCOUNTER — Ambulatory Visit
Admission: RE | Admit: 2021-08-03 | Discharge: 2021-08-03 | Disposition: A | Discharge status: Home / Self Care | Payer: Medicare Other | Source: Ambulatory Visit | Attending: Radiation Oncology | Admitting: Radiation Oncology

## 2021-08-03 ENCOUNTER — Other Ambulatory Visit (HOSPITAL_COMMUNITY): Payer: Self-pay

## 2021-08-03 DIAGNOSIS — Z510 Encounter for antineoplastic radiation therapy: Secondary | ICD-10-CM | POA: Diagnosis not present

## 2021-08-06 ENCOUNTER — Ambulatory Visit
Admission: RE | Admit: 2021-08-06 | Discharge: 2021-08-06 | Disposition: A | Discharge status: Home / Self Care | Payer: Medicare Other | Source: Ambulatory Visit | Attending: Radiation Oncology | Admitting: Radiation Oncology

## 2021-08-06 ENCOUNTER — Encounter: Payer: Self-pay | Admitting: Family Medicine

## 2021-08-06 ENCOUNTER — Telehealth: Payer: Self-pay | Admitting: Family Medicine

## 2021-08-06 ENCOUNTER — Ambulatory Visit (INDEPENDENT_AMBULATORY_CARE_PROVIDER_SITE_OTHER): Payer: Medicare Other | Admitting: Family Medicine

## 2021-08-06 ENCOUNTER — Other Ambulatory Visit: Payer: Self-pay

## 2021-08-06 ENCOUNTER — Telehealth: Payer: Self-pay

## 2021-08-06 VITALS — BP 148/60 | HR 58 | Temp 98.4°F | Resp 18 | Ht 64.0 in | Wt 163.6 lb

## 2021-08-06 DIAGNOSIS — E1165 Type 2 diabetes mellitus with hyperglycemia: Secondary | ICD-10-CM

## 2021-08-06 DIAGNOSIS — I2699 Other pulmonary embolism without acute cor pulmonale: Secondary | ICD-10-CM

## 2021-08-06 DIAGNOSIS — Z510 Encounter for antineoplastic radiation therapy: Secondary | ICD-10-CM | POA: Diagnosis not present

## 2021-08-06 DIAGNOSIS — I10 Essential (primary) hypertension: Secondary | ICD-10-CM

## 2021-08-06 DIAGNOSIS — I6529 Occlusion and stenosis of unspecified carotid artery: Secondary | ICD-10-CM

## 2021-08-06 DIAGNOSIS — E785 Hyperlipidemia, unspecified: Secondary | ICD-10-CM | POA: Diagnosis not present

## 2021-08-06 NOTE — Telephone Encounter (Signed)
TC from above Pt's daughter Corwin Levins requesting clearance for an injection she is suppose to receive for pain from [facility]. Informed Pt's daughter that [facility] can fax Dr Benay Spice a Clearance form with name of drug so Dr. Benay Spice can look it over to see if it would not interfere with her chemo. Gave daughter clinic fax number to give to [facility].

## 2021-08-06 NOTE — Assessment & Plan Note (Signed)
On xaralto 

## 2021-08-06 NOTE — Assessment & Plan Note (Signed)
con't meds  Check labs 

## 2021-08-06 NOTE — Assessment & Plan Note (Signed)
Well controlled, no changes to meds. Encouraged heart healthy diet such as the DASH diet and exercise as tolerated.  °

## 2021-08-06 NOTE — Telephone Encounter (Signed)
Pt needs clearance to receive neck injection, she will need to be off of Xarzelto for 5 days. Clearance can be faxed to: Dr.Ramos    [phone] or [phone]

## 2021-08-06 NOTE — Progress Notes (Signed)
Established Patient Office Visit  Subjective:  Patient ID: Sherry Holloway, female    DOB: June 12, 1928  Age: 87 y.o. MRN: [id number]  CC:  Chief Complaint  Patient presents with   Hypertension   Follow-up    HPI Sherry Holloway presents for f/u bp  , chol and dm.  Her daughter is with her.    HPI HYPERTENSION  Blood pressure range-normal   Chest pain- no      Dyspnea- no Lightheadedness- no   Edema- no Other side effects - no   Medication compliance: good Low salt diet- yes  DIABETES  Blood Sugar ranges-under 200  Polyuria- no New Visual problems- no Hypoglycemic symptoms- no Other side effects-no Medication compliance - good Last eye exam- due Foot exam- done by endo last ov  HYPERLIPIDEMIA  Medication compliance- good RUQ pain- no  Muscle aches- no Other side effects-no     Past Medical History:  Diagnosis Date   Arthritis    Chicken pox    Diabetes mellitus without complication (HCC)    Diverticulitis    Esophageal cancer (HCC) dx'd 2020   Esophagus cancer (HCC)    GERD (gastroesophageal reflux disease)    Hyperlipidemia    Hypertension    OSA (obstructive sleep apnea)    Urine incontinence    Uterine cancer (HCC)     Past Surgical History:  Procedure Laterality Date   ABDOMINAL HYSTERECTOMY  1972   APPENDECTOMY     BILIARY DILATION  03/05/2021   Procedure: BILIARY DILATION;  Surgeon: Irving Copas., MD;  Location: [facility];  Service: Gastroenterology;;   BIOPSY  03/05/2021   Procedure: BIOPSY;  Surgeon: Irving Copas., MD;  Location: [facility];  Service: Gastroenterology;;   CHOLECYSTECTOMY     ENDOSCOPIC RETROGRADE CHOLANGIOPANCREATOGRAPHY (ERCP) WITH PROPOFOL N/A 03/05/2021   Procedure: ENDOSCOPIC RETROGRADE CHOLANGIOPANCREATOGRAPHY (ERCP) WITH PROPOFOL;  Surgeon: Irving Copas., MD;  Location: [facility];  Service: Gastroenterology;  Laterality: N/A;   ESOPHAGOGASTRODUODENOSCOPY (EGD) WITH PROPOFOL N/A  03/05/2021   Procedure: ESOPHAGOGASTRODUODENOSCOPY (EGD) WITH PROPOFOL;  Surgeon: Rush Landmark Telford Nab., MD;  Location: [facility];  Service: Gastroenterology;  Laterality: N/A;   EUS N/A 03/05/2021   Procedure: UPPER ENDOSCOPIC ULTRASOUND (EUS) RADIAL;  Surgeon: Irving Copas., MD;  Location: [facility];  Service: Gastroenterology;  Laterality: N/A;   LUMBAR LAMINECTOMY     REMOVAL OF STONES  03/05/2021   Procedure: REMOVAL OF STONES;  Surgeon: Rush Landmark Telford Nab., MD;  Location: [facility];  Service: Gastroenterology;;   SPINAL CORD STIMULATOR IMPLANT     SPYGLASS CHOLANGIOSCOPY N/A 03/05/2021   Procedure: [id number] CHOLANGIOSCOPY;  Surgeon: Irving Copas., MD;  Location: [facility];  Service: Gastroenterology;  Laterality: N/A;   SPYGLASS LITHOTRIPSY N/A 03/05/2021   Procedure: [id number] LITHOTRIPSY;  Surgeon: Irving Copas., MD;  Location: [facility];  Service: Gastroenterology;  Laterality: N/A;   TONSILLECTOMY     TOTAL KNEE ARTHROPLASTY Right     Family History  Problem Relation Age of Onset   Diabetes Mother    Heart disease Father    Lung cancer Sister    Lung cancer Son    Hypertension Son    Hypertension Daughter    Diabetes Son    Hypertension Son    Diabetes Daughter    Hypertension Daughter     Social History   Socioeconomic History   Marital status: Widowed    Spouse name: Not on file   Number of children: 4   Years of education:  Not on file   Highest education level: Not on file  Occupational History   Occupation: Retired CNA  Tobacco Use   Smoking status: Former    Types: Cigarettes    Quit date: 01/17/1989    Years since quitting: 32.5   Smokeless tobacco: Never  Vaping Use   Vaping Use: Never used  Substance and Sexual Activity   Alcohol use: Not Currently   Drug use: Never   Sexual activity: Not Currently  Other Topics Concern   Not on file  Social History Narrative   Just moved from [place]--- and moved into an  apt 2.4 miles from her daughter    Social Determinants of Health   Financial Resource Strain: Not on file  Food Insecurity: Not on file  Transportation Needs: Not on file  Physical Activity: Not on file  Stress: Not on file  Social Connections: Not on file  Intimate Partner Violence: Not At Risk   Fear of Current or Ex-Partner: No   Emotionally Abused: No   Physically Abused: No   Sexually Abused: No    Outpatient Medications Prior to Visit  Medication Sig Dispense Refill   acetaminophen (TYLENOL) 325 MG tablet Take 352-650 mg by mouth every 6 (six) hours as needed for mild pain or moderate pain.     bumetanide (BUMEX) 1 MG tablet Take 1 tablet (1 mg total) by mouth daily. Take 1/2 to 1 tablet daily for swelling 90 tablet 1   capecitabine (XELODA) 500 MG tablet Take 3 tablets ('1500mg'$ ) by mouth in AM and  2 tablets ('1000mg'$ ) in PM. Take with food. Take Monday- Friday. Take only on days of radiation. 140 tablet 0   diclofenac Sodium (VOLTAREN) 1 % GEL Apply topically.     docusate sodium (COLACE) 100 MG capsule Take 1 capsule (100 mg total) by mouth 2 (two) times daily. 10 capsule 0   famciclovir (FAMVIR) 125 MG tablet Take 1 tablet (125 mg total) by mouth daily. 90 tablet 3   glimepiride (AMARYL) 2 MG tablet Take 2 mg by mouth daily with supper.     glimepiride (AMARYL) 4 MG tablet Take 1 tablet (4 mg total) by mouth daily before breakfast. 90 tablet 1   glucose blood test strip Check blood sugars twice daily 200 each 12   latanoprost (XALATAN) 0.005 % ophthalmic solution Place 1 drop into both eyes at bedtime.     lipase/protease/amylase (CREON) 36000 UNITS CPEP capsule Take 1 capsule (36,000 Units total) by mouth 3 (three) times daily before meals. (Patient taking differently: Take 36,000 Units by mouth 2 (two) times daily.) 270 capsule 1   mirtazapine (REMERON) 15 MG tablet Take 1 tablet (15 mg total) by mouth at bedtime. (Patient taking differently: Take 15 mg by mouth daily with  supper.) 30 tablet 0   morphine (MS CONTIN) 30 MG 12 hr tablet Take 30 mg by mouth every 12 (twelve) hours.     Multiple Vitamins-Minerals (WOMENS 50+ MULTI VITAMIN/MIN PO) Take 1 tablet by mouth daily with supper.     omeprazole (PRILOSEC) 20 MG capsule Take 1 capsule (20 mg total) by mouth 2 (two) times daily before a meal. 180 capsule 3   rivaroxaban (XARELTO) 20 MG TABS tablet Take 1 tablet (20 mg total) by mouth daily with supper. Please start only after the starter pack has been completed. 90 tablet 0   valsartan (DIOVAN) 40 MG tablet Take 40 mg by mouth daily with breakfast.     Apoaequorin 10 MG  CAPS Take 10 mg by mouth daily. (Patient not taking: Reported on 08/06/2021) 30 capsule 5   No facility-administered medications prior to visit.    Allergies  Allergen Reactions   Gramicidin Other (See Comments)    unknown   Sulfa Antibiotics Rash   Wound Dressing Adhesive Rash    ROS Review of Systems  Constitutional:  Negative for appetite change, diaphoresis, fatigue and unexpected weight change.  Eyes:  Negative for pain, redness and visual disturbance.  Respiratory:  Negative for cough, chest tightness, shortness of breath and wheezing.   Cardiovascular:  Negative for chest pain, palpitations and leg swelling.  Endocrine: Negative for cold intolerance, heat intolerance, polydipsia, polyphagia and polyuria.  Genitourinary:  Negative for difficulty urinating, dysuria and frequency.  Neurological:  Negative for dizziness, light-headedness, numbness and headaches.     Objective:    Physical Exam Vitals and nursing note reviewed.  Constitutional:      Appearance: She is well-developed.  HENT:     Head: Normocephalic and atraumatic.  Eyes:     Conjunctiva/sclera: Conjunctivae normal.  Neck:     Thyroid: No thyromegaly.     Vascular: No carotid bruit or JVD.  Cardiovascular:     Rate and Rhythm: Normal rate and regular rhythm.     Heart sounds: Normal heart sounds. No murmur  heard. Pulmonary:     Effort: Pulmonary effort is normal. No respiratory distress.     Breath sounds: Normal breath sounds. No wheezing or rales.  Chest:     Chest wall: No tenderness.  Musculoskeletal:     Cervical back: Normal range of motion and neck supple.  Neurological:     Mental Status: She is alert and oriented to person, place, and time.  Psychiatric:        Mood and Affect: Mood normal.        Thought Content: Thought content normal.        Judgment: Judgment normal.    BP (!) 148/60 (BP Location: Right Arm, Patient Position: Sitting, Cuff Size: Normal)   Pulse (!) 58   Temp 98.4 F (36.9 C) (Oral)   Resp 18   Ht '5\' 4"'$  (1.626 m)   Wt 163 lb 9.6 oz (74.2 kg)   SpO2 96%   BMI 28.08 kg/m  Wt Readings from Last 3 Encounters:  08/06/21 163 lb 9.6 oz (74.2 kg)  07/23/21 165 lb (74.8 kg)  07/17/21 164 lb (74.4 kg)     Health Maintenance Due  Topic Date Due   FOOT EXAM  Never done   OPHTHALMOLOGY EXAM  Never done   TETANUS/TDAP  Never done   Zoster Vaccines- Shingrix (1 of 2) Never done   PNA vac Low Risk Adult (1 of 2 - PCV13) Never done   COVID-19 Vaccine (5 - Booster for Pfizer series) 01/21/2021   INFLUENZA VACCINE  07/16/2021    There are no preventive care reminders to display for this patient.  No results found for: TSH Lab Results  Component Value Date   WBC 8.3 07/23/2021   HGB 12.3 07/23/2021   HCT 39.2 07/23/2021   MCV 87.9 07/23/2021   PLT 197 07/23/2021   Lab Results  Component Value Date   NA 137 07/23/2021   K 4.7 07/23/2021   CO2 28 07/23/2021   GLUCOSE 188 (H) 07/23/2021   BUN 24 (H) 07/23/2021   CREATININE 0.98 07/23/2021   BILITOT 0.3 07/23/2021   ALKPHOS 66 07/23/2021   AST 11 (L) 07/23/2021  ALT 18 07/23/2021   PROT 6.8 07/23/2021   ALBUMIN 3.8 07/23/2021   CALCIUM 9.8 07/23/2021   ANIONGAP 6 07/23/2021   GFR 49.96 (L) 04/20/2021   Lab Results  Component Value Date   CHOL 135 04/20/2021   Lab Results  Component  Value Date   HDL 48.00 04/20/2021   Lab Results  Component Value Date   LDLCALC 66 04/20/2021   Lab Results  Component Value Date   TRIG 105.0 04/20/2021   Lab Results  Component Value Date   CHOLHDL 3 04/20/2021   Lab Results  Component Value Date   HGBA1C 8.3 (H) 04/20/2021      Assessment & Plan:   Problem List Items Addressed This Visit       Unprioritized   Acute pulmonary embolism ([place])    On xaralto      Hyperlipidemia    Encourage heart healthy diet such as MIND or DASH diet, increase exercise, avoid trans fats, simple carbohydrates and processed foods, consider a krill or fish or flaxseed oil cap daily.       Hypertension    Well controlled, no changes to meds. Encouraged heart healthy diet such as the DASH diet and exercise as tolerated.       Relevant Orders   Lipid panel   Hemoglobin A1c   Type 2 diabetes mellitus with hyperglycemia, without long-term current use of insulin (HCC)    con't meds Check labs       Other Visit Diagnoses     Uncontrolled type 2 diabetes mellitus with hyperglycemia ([place])    -  Primary   Relevant Orders   Lipid panel   Hemoglobin A1c       No orders of the defined types were placed in this encounter.   Follow-up: Return in about 6 months (around 02/06/2022), or if symptoms worsen or fail to improve, for hypertension, hyperlipidemia, diabetes II.    Ann Held, DO

## 2021-08-06 NOTE — Patient Instructions (Signed)
https://www.nhlbi.nih.gov/files/docs/public/heart/dash_brief.pdf">  DASH Eating Plan DASH stands for Dietary Approaches to Stop Hypertension. The DASH eating plan is a healthy eating plan that has been shown to: Reduce high blood pressure (hypertension). Reduce your risk for type 2 diabetes, heart disease, and stroke. Help with weight loss. What are tips for following this plan? Reading food labels Check food labels for the amount of salt (sodium) per serving. Choose foods with less than 5 percent of the Daily Value of sodium. Generally, foods with less than 300 milligrams (mg) of sodium per serving fit into this eating plan. To find whole grains, look for the word "whole" as the first word in the ingredient list. Shopping Buy products labeled as "low-sodium" or "no salt added." Buy fresh foods. Avoid canned foods and pre-made or frozen meals. Cooking Avoid adding salt when cooking. Use salt-free seasonings or herbs instead of table salt or sea salt. Check with your health care provider or pharmacist before using salt substitutes. Do not fry foods. Cook foods using healthy methods such as baking, boiling, grilling, roasting, and broiling instead. Cook with heart-healthy oils, such as olive, canola, avocado, soybean, or sunflower oil. Meal planning  Eat a balanced diet that includes: 4 or more servings of fruits and 4 or more servings of vegetables each day. Try to fill one-half of your plate with fruits and vegetables. 6-8 servings of whole grains each day. Less than 6 oz (170 g) of lean meat, poultry, or fish each day. A 3-oz (85-g) serving of meat is about the same size as a deck of cards. One egg equals 1 oz (28 g). 2-3 servings of low-fat dairy each day. One serving is 1 cup (237 mL). 1 serving of nuts, seeds, or beans 5 times each week. 2-3 servings of heart-healthy fats. Healthy fats called omega-3 fatty acids are found in foods such as walnuts, flaxseeds, fortified milks, and eggs.  These fats are also found in cold-water fish, such as sardines, salmon, and mackerel. Limit how much you eat of: Canned or prepackaged foods. Food that is high in trans fat, such as some fried foods. Food that is high in saturated fat, such as fatty meat. Desserts and other sweets, sugary drinks, and other foods with added sugar. Full-fat dairy products. Do not salt foods before eating. Do not eat more than 4 egg yolks a week. Try to eat at least 2 vegetarian meals a week. Eat more home-cooked food and less restaurant, buffet, and fast food.  Lifestyle When eating at a restaurant, ask that your food be prepared with less salt or no salt, if possible. If you drink alcohol: Limit how much you use to: 0-1 drink a day for women who are not pregnant. 0-2 drinks a day for men. Be aware of how much alcohol is in your drink. In the [place] one drink equals one 12 oz bottle of beer (355 mL), one 5 oz glass of wine (148 mL), or one 1 oz glass of hard liquor (44 mL). General information Avoid eating more than 2,300 mg of salt a day. If you have hypertension, you may need to reduce your sodium intake to 1,500 mg a day. Work with your health care provider to maintain a healthy body weight or to lose weight. Ask what an ideal weight is for you. Get at least 30 minutes of exercise that causes your heart to beat faster (aerobic exercise) most days of the week. Activities may include walking, swimming, or biking. Work with your health care provider   or dietitian to adjust your eating plan to your individual calorie needs. What foods should I eat? Fruits All fresh, dried, or frozen fruit. Canned fruit in natural juice (without addedsugar). Vegetables Fresh or frozen vegetables (raw, steamed, roasted, or grilled). Low-sodium or reduced-sodium tomato and vegetable juice. Low-sodium or reduced-sodium tomatosauce and tomato paste. Low-sodium or reduced-sodium canned vegetables. Grains Whole-grain or  whole-wheat bread. Whole-grain or whole-wheat pasta. Brown rice. Oatmeal. Quinoa. Bulgur. Whole-grain and low-sodium cereals. Pita bread.Low-fat, low-sodium crackers. Whole-wheat flour tortillas. Meats and other proteins Skinless chicken or turkey. Ground chicken or turkey. Pork with fat trimmed off. Fish and seafood. Egg whites. Dried beans, peas, or lentils. Unsalted nuts, nut butters, and seeds. Unsalted canned beans. Lean cuts of beef with fat trimmed off. Low-sodium, lean precooked or cured meat, such as sausages or meatloaves. Dairy Low-fat (1%) or fat-free (skim) milk. Reduced-fat, low-fat, or fat-free cheeses. Nonfat, low-sodium ricotta or cottage cheese. Low-fat or nonfatyogurt. Low-fat, low-sodium cheese. Fats and oils Soft margarine without trans fats. Vegetable oil. Reduced-fat, low-fat, or light mayonnaise and salad dressings (reduced-sodium). Canola, safflower, olive, avocado, soybean, andsunflower oils. Avocado. Seasonings and condiments Herbs. Spices. Seasoning mixes without salt. Other foods Unsalted popcorn and pretzels. Fat-free sweets. The items listed above may not be a complete list of foods and beverages you can eat. Contact a dietitian for more information. What foods should I avoid? Fruits Canned fruit in a light or heavy syrup. Fried fruit. Fruit in cream or buttersauce. Vegetables Creamed or fried vegetables. Vegetables in a cheese sauce. Regular canned vegetables (not low-sodium or reduced-sodium). Regular canned tomato sauce and paste (not low-sodium or reduced-sodium). Regular tomato and vegetable juice(not low-sodium or reduced-sodium). Pickles. Olives. Grains Baked goods made with fat, such as croissants, muffins, or some breads. Drypasta or rice meal packs. Meats and other proteins Fatty cuts of meat. Ribs. Fried meat. Bacon. Bologna, salami, and other precooked or cured meats, such as sausages or meat loaves. Fat from the back of a pig (fatback). Bratwurst.  Salted nuts and seeds. Canned beans with added salt. Canned orsmoked fish. Whole eggs or egg yolks. Chicken or turkey with skin. Dairy Whole or 2% milk, cream, and half-and-half. Whole or full-fat cream cheese. Whole-fat or sweetened yogurt. Full-fat cheese. Nondairy creamers. Whippedtoppings. Processed cheese and cheese spreads. Fats and oils Butter. Stick margarine. Lard. Shortening. Ghee. Bacon fat. Tropical oils, suchas coconut, palm kernel, or palm oil. Seasonings and condiments Onion salt, garlic salt, seasoned salt, table salt, and sea salt. Worcestershire sauce. Tartar sauce. Barbecue sauce. Teriyaki sauce. Soy sauce, including reduced-sodium. Steak sauce. Canned and packaged gravies. Fish sauce. Oyster sauce. Cocktail sauce. Store-bought horseradish. Ketchup. Mustard. Meat flavorings and tenderizers. Bouillon cubes. Hot sauces. Pre-made or packaged marinades. Pre-made or packaged taco seasonings. Relishes. Regular saladdressings. Other foods Salted popcorn and pretzels. The items listed above may not be a complete list of foods and beverages you should avoid. Contact a dietitian for more information. Where to find more information National Heart, Lung, and Blood Institute: www.nhlbi.nih.gov American Heart Association: www.heart.org Academy of Nutrition and Dietetics: www.eatright.org National Kidney Foundation: www.kidney.org Summary The DASH eating plan is a healthy eating plan that has been shown to reduce high blood pressure (hypertension). It may also reduce your risk for type 2 diabetes, heart disease, and stroke. When on the DASH eating plan, aim to eat more fresh fruits and vegetables, whole grains, lean proteins, low-fat dairy, and heart-healthy fats. With the DASH eating plan, you should limit salt (sodium) intake to 2,300   mg a day. If you have hypertension, you may need to reduce your sodium intake to 1,500 mg a day. Work with your health care provider or dietitian to adjust  your eating plan to your individual calorie needs. This information is not intended to replace advice given to you by your health care provider. Make sure you discuss any questions you have with your healthcare provider. Document Revised: 11/05/2019 Document Reviewed: 11/05/2019 Elsevier Patient Education  2022 Elsevier Inc.  

## 2021-08-06 NOTE — Assessment & Plan Note (Signed)
Encourage heart healthy diet such as MIND or DASH diet, increase exercise, avoid trans fats, simple carbohydrates and processed foods, consider a krill or fish or flaxseed oil cap daily.  °

## 2021-08-07 ENCOUNTER — Ambulatory Visit
Admission: RE | Admit: 2021-08-07 | Discharge: 2021-08-07 | Disposition: A | Discharge status: Home / Self Care | Payer: Medicare Other | Source: Ambulatory Visit | Attending: Radiation Oncology | Admitting: Radiation Oncology

## 2021-08-07 DIAGNOSIS — Z510 Encounter for antineoplastic radiation therapy: Secondary | ICD-10-CM | POA: Diagnosis not present

## 2021-08-07 LAB — LIPID PANEL
Cholesterol: 137 mg/dL (ref 0–200)
HDL: 57.5 mg/dL (ref >39.00)
LDL Cholesterol: 52 mg/dL (ref 0–99)
NonHDL: 79.15
Total CHOL/HDL Ratio: 2
Triglycerides: 138 mg/dL (ref 0.0–149.0)
VLDL: 27.6 mg/dL (ref 0.0–40.0)

## 2021-08-07 LAB — HEMOGLOBIN A1C: Hgb A1c MFr Bld: 8.7 % — ABNORMAL HIGH (ref 4.6–6.5)

## 2021-08-08 ENCOUNTER — Ambulatory Visit
Admission: RE | Admit: 2021-08-08 | Discharge: 2021-08-08 | Disposition: A | Discharge status: Home / Self Care | Payer: Medicare Other | Source: Ambulatory Visit | Attending: Radiation Oncology | Admitting: Radiation Oncology

## 2021-08-08 ENCOUNTER — Other Ambulatory Visit: Payer: Self-pay

## 2021-08-08 DIAGNOSIS — Z510 Encounter for antineoplastic radiation therapy: Secondary | ICD-10-CM | POA: Diagnosis not present

## 2021-08-09 ENCOUNTER — Encounter: Payer: Self-pay | Admitting: Family Medicine

## 2021-08-09 ENCOUNTER — Ambulatory Visit (INDEPENDENT_AMBULATORY_CARE_PROVIDER_SITE_OTHER): Payer: Medicare Other | Admitting: Neurology

## 2021-08-09 ENCOUNTER — Ambulatory Visit
Admission: RE | Admit: 2021-08-09 | Discharge: 2021-08-09 | Disposition: A | Discharge status: Home / Self Care | Payer: Medicare Other | Source: Ambulatory Visit | Attending: Radiation Oncology | Admitting: Radiation Oncology

## 2021-08-09 ENCOUNTER — Encounter: Payer: Self-pay | Admitting: Nurse Practitioner

## 2021-08-09 ENCOUNTER — Other Ambulatory Visit: Payer: Self-pay

## 2021-08-09 ENCOUNTER — Inpatient Hospital Stay (HOSPITAL_BASED_OUTPATIENT_CLINIC_OR_DEPARTMENT_OTHER): Payer: Medicare Other | Admitting: Nurse Practitioner

## 2021-08-09 ENCOUNTER — Inpatient Hospital Stay: Payer: Medicare Other

## 2021-08-09 VITALS — BP 155/50 | HR 74 | Temp 98.2°F | Resp 18 | Ht 64.0 in | Wt 163.3 lb

## 2021-08-09 DIAGNOSIS — E119 Type 2 diabetes mellitus without complications: Secondary | ICD-10-CM

## 2021-08-09 DIAGNOSIS — Z8501 Personal history of malignant neoplasm of esophagus: Secondary | ICD-10-CM

## 2021-08-09 DIAGNOSIS — Z510 Encounter for antineoplastic radiation therapy: Secondary | ICD-10-CM | POA: Diagnosis not present

## 2021-08-09 DIAGNOSIS — G4733 Obstructive sleep apnea (adult) (pediatric): Secondary | ICD-10-CM | POA: Diagnosis not present

## 2021-08-09 DIAGNOSIS — K8689 Other specified diseases of pancreas: Secondary | ICD-10-CM

## 2021-08-09 DIAGNOSIS — R634 Abnormal weight loss: Secondary | ICD-10-CM

## 2021-08-09 DIAGNOSIS — C154 Malignant neoplasm of middle third of esophagus: Secondary | ICD-10-CM

## 2021-08-09 DIAGNOSIS — R0683 Snoring: Secondary | ICD-10-CM

## 2021-08-09 LAB — CBC WITH DIFFERENTIAL (CANCER CENTER ONLY)
Abs Immature Granulocytes: 0.02 10*3/uL (ref 0.00–0.07)
Basophils Absolute: 0 10*3/uL (ref 0.0–0.1)
Basophils Relative: 0 %
Eosinophils Absolute: 0.1 10*3/uL (ref 0.0–0.5)
Eosinophils Relative: 1 %
HCT: 37.8 % (ref 36.0–46.0)
Hemoglobin: 11.8 g/dL — ABNORMAL LOW (ref 12.0–15.0)
Immature Granulocytes: 0 %
Lymphocytes Relative: 26 %
Lymphs Abs: 1.4 10*3/uL (ref 0.7–4.0)
MCH: 27.8 pg (ref 26.0–34.0)
MCHC: 31.2 g/dL (ref 30.0–36.0)
MCV: 89.2 fL (ref 80.0–100.0)
Monocytes Absolute: 0.4 10*3/uL (ref 0.1–1.0)
Monocytes Relative: 7 %
Neutro Abs: 3.5 10*3/uL (ref 1.7–7.7)
Neutrophils Relative %: 66 %
Platelet Count: 173 10*3/uL (ref 150–400)
RBC: 4.24 MIL/uL (ref 3.87–5.11)
RDW: 15.5 % (ref 11.5–15.5)
WBC Count: 5.5 10*3/uL (ref 4.0–10.5)
nRBC: 0 % (ref 0.0–0.2)

## 2021-08-09 LAB — CMP (CANCER CENTER ONLY)
ALT: 17 U/L (ref 0–44)
AST: 12 U/L — ABNORMAL LOW (ref 15–41)
Albumin: 4.1 g/dL (ref 3.5–5.0)
Alkaline Phosphatase: 61 U/L (ref 38–126)
Anion gap: 10 (ref 5–15)
BUN: 23 mg/dL (ref 8–23)
CO2: 24 mmol/L (ref 22–32)
Calcium: 9.8 mg/dL (ref 8.9–10.3)
Chloride: 103 mmol/L (ref 98–111)
Creatinine: 1.02 mg/dL — ABNORMAL HIGH (ref 0.44–1.00)
GFR, Estimated: 52 mL/min — ABNORMAL LOW (ref >60)
Glucose, Bld: 228 mg/dL — ABNORMAL HIGH (ref 70–99)
Potassium: 4.5 mmol/L (ref 3.5–5.1)
Sodium: 137 mmol/L (ref 135–145)
Total Bilirubin: 0.4 mg/dL (ref 0.3–1.2)
Total Protein: 6.7 g/dL (ref 6.5–8.1)

## 2021-08-09 NOTE — Progress Notes (Signed)
[facility] OFFICE PROGRESS NOTE   Diagnosis: Esophagus cancer  INTERVAL HISTORY:   Sherry Holloway returns as scheduled.  She began radiation/Xeloda 07/26/2021.  She reports feeling well.  She denies nausea/vomiting.  No mouth sores.  No diarrhea.  No hand or foot pain or redness.  No further falls.  No dysphagia.  She is tolerating a regular diet.  Objective:  Vital signs in last 24 hours:  Blood pressure (!) 155/50, pulse 74, temperature 98.2 F (36.8 C), resp. rate 18, height '5\' 4"'$  (1.626 m), weight 163 lb 4.8 oz (74.1 kg), SpO2 96 %.    HEENT: No thrush or ulcers. Resp: Lungs clear bilaterally. Cardio: Regular rate and rhythm. GI: Abdomen soft and nontender.  No hepatomegaly. Vascular: No leg edema. Skin: Palms without erythema.  Soles with mild erythema.  Nontender.   Lab Results:  Lab Results  Component Value Date   WBC 5.5 08/09/2021   HGB 11.8 (L) 08/09/2021   HCT 37.8 08/09/2021   MCV 89.2 08/09/2021   PLT 173 08/09/2021   NEUTROABS 3.5 08/09/2021    Imaging:  No results found.  Medications: I have reviewed the patient's current medications.  Assessment/Plan: Esophagus cancer Upper EUS 03/05/2021-small submucosal ulcerating nodule with no bleeding and no stigmata of recent bleeding in the midesophagus, 29 to 31 cm from the incisors.  Lesion nonobstructing and not circumferential.  Patchy mildly erythematous mucosa without bleeding was found in the gastric body and gastric antrum.  The esophagus lesion was staged T1sm N0 MX.  Pancreatic parenchymal abnormalities consisting of atrophy were noted in the entire pancreas.  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  There was dilation of the common bile duct.  No malignant appearing lymph nodes were visualized in the middle paraesophageal  mediastinum, lower paraesophageal mediastinum, celiac region, peripancreatic region and porta hepatis region.  Esophagus biopsy showed invasive carcinoma.  Stomach biopsy showed reactive gastropathy; no intestinal metaplasia, dysplasia or malignancy. PET 04/02/2021- low-level hypermetabolism associated with a 0.7 cm nodule at the mid thoracic esophagus, hypermetabolic cystic pancreas head mass with diffuse intrahepatic and extra fatigability duct dilatation compatible with malignant main duct IPMN, no evidence of metastatic disease CT chest and pelvis 03/16/2021-nonocclusive right lower lobe pulmonary embolus extending into segmental branches of the right lower lobe.  Nodular area at the left lung base with bandlike changes that extend from this area on the current study and on the prior study.  This may represent a combination of scarring and atelectasis.  Measures 1.4 x 1.2 cm, previously 2.1 x 1.9 cm.  Suggestion of slight interval decrease in size as well of a right lower lobe process with nodular features.  Persistent marked biliary duct distention. Radiation/Xeloda 07/26/2021 CT scan of the abdomen on 01/25/2021 due to history of a pancreatic mass.  The CT showed gross intra and extrahepatic biliary ductal dilatation, the central common bile duct appeared to contain a poorly calcified calculus or soft tissue nodule measuring approximately 1.9 cm.  There was diffuse atrophy of the pancreatic parenchyma and dilatation of the main pancreatic duct.  There was a hypodense mass or gross pancreatic ductal dilatation within the pancreatic head measuring 3.5 x 2.0 x 2.0 cm.  Nodular appearing consolidation of the dependent left lung base measuring 2.4 x 1.8 cm, nonspecific. ERCP by Dr. Rush Landmark on 03/05/2021.  The major papilla  was located entirely within a pantaloon diverticulum.  Prior biliary sphincterotomy appeared open.  Pancreatic duct fishmouth deformity noted.  Filling defects consistent with stones and sludge  were seen on the cholangiogram.  The entire main bile duct was severely dilated.  Choledocholithiasis was found.  Biliary sphincteroplasty performed.  Electrohydraulic lithotripsy of a remaining stone performed.   Pulmonary embolus on chest CT 03/15/2021-on Xarelto.  Bilateral lower extremity venous Doppler study with no significant femoral-popliteal DVT.  Limited assessment of the calf veins.  5.7 cm left popliteal fossa Baker's cyst. GERD Hypertension Diabetes OSA Subclavian arterial stenosis History of uterine cancer Chronic back pain maintained on MS Contin, followed by Dr. Nelva Bush IPMN- diagnosed in July 2020 Biliary stones-status post ERCP/sphincterotomy 07/14/2019 for removal of stones    Disposition: Ms. Tuey appears well.  She continues radiation/Xeloda.  She is tolerating treatment well.  We reviewed the CBC from today.  Counts adequate to continue with Xeloda.  She will return for lab and follow-up in approximately 2 weeks.  She will contact the office in the interim with any problems.    Ned Card ANP/GNP-BC   08/09/2021  2:36 PM

## 2021-08-10 ENCOUNTER — Ambulatory Visit
Admission: RE | Admit: 2021-08-10 | Discharge: 2021-08-10 | Disposition: A | Discharge status: Home / Self Care | Payer: Medicare Other | Source: Ambulatory Visit | Attending: Radiation Oncology | Admitting: Radiation Oncology

## 2021-08-10 ENCOUNTER — Other Ambulatory Visit (HOSPITAL_COMMUNITY): Payer: Self-pay

## 2021-08-10 ENCOUNTER — Other Ambulatory Visit: Payer: Self-pay

## 2021-08-10 DIAGNOSIS — C154 Malignant neoplasm of middle third of esophagus: Secondary | ICD-10-CM

## 2021-08-10 DIAGNOSIS — Z510 Encounter for antineoplastic radiation therapy: Secondary | ICD-10-CM | POA: Diagnosis not present

## 2021-08-10 MED ORDER — SONAFINE EX EMUL
1.0000 "application " | Freq: Once | CUTANEOUS | Status: AC
  Administered 2021-08-10: 1 via TOPICAL

## 2021-08-10 NOTE — Addendum Note (Signed)
Encounter addended by: Marylene Land, LPN on: [id number] PM  Actions taken: [facility] administration accepted

## 2021-08-10 NOTE — Addendum Note (Signed)
Encounter addended by: Marylene Land, LPN on: [id number] PM  Actions taken: [facility] administration accepted, Clinical Note Signed

## 2021-08-13 ENCOUNTER — Other Ambulatory Visit: Payer: Self-pay

## 2021-08-13 ENCOUNTER — Ambulatory Visit
Admission: RE | Admit: 2021-08-13 | Discharge: 2021-08-13 | Disposition: A | Discharge status: Home / Self Care | Payer: Medicare Other | Source: Ambulatory Visit | Attending: Radiation Oncology | Admitting: Radiation Oncology

## 2021-08-13 DIAGNOSIS — Z510 Encounter for antineoplastic radiation therapy: Secondary | ICD-10-CM | POA: Diagnosis not present

## 2021-08-13 NOTE — Telephone Encounter (Signed)
VM left to call back.

## 2021-08-14 ENCOUNTER — Ambulatory Visit
Admission: RE | Admit: 2021-08-14 | Discharge: 2021-08-14 | Disposition: A | Discharge status: Home / Self Care | Payer: Medicare Other | Source: Ambulatory Visit | Attending: Radiation Oncology | Admitting: Radiation Oncology

## 2021-08-14 ENCOUNTER — Other Ambulatory Visit (HOSPITAL_COMMUNITY): Payer: Self-pay

## 2021-08-14 DIAGNOSIS — Z510 Encounter for antineoplastic radiation therapy: Secondary | ICD-10-CM | POA: Diagnosis not present

## 2021-08-15 ENCOUNTER — Ambulatory Visit
Admission: RE | Admit: 2021-08-15 | Discharge: 2021-08-15 | Disposition: A | Discharge status: Home / Self Care | Payer: Medicare Other | Source: Ambulatory Visit | Attending: Radiation Oncology | Admitting: Radiation Oncology

## 2021-08-15 ENCOUNTER — Other Ambulatory Visit: Payer: Self-pay

## 2021-08-15 DIAGNOSIS — Z510 Encounter for antineoplastic radiation therapy: Secondary | ICD-10-CM | POA: Diagnosis not present

## 2021-08-16 ENCOUNTER — Other Ambulatory Visit (HOSPITAL_COMMUNITY): Payer: Self-pay

## 2021-08-16 ENCOUNTER — Other Ambulatory Visit: Payer: Self-pay | Admitting: Radiation Oncology

## 2021-08-16 ENCOUNTER — Ambulatory Visit
Admission: RE | Admit: 2021-08-16 | Discharge: 2021-08-16 | Disposition: A | Discharge status: Home / Self Care | Payer: Medicare Other | Source: Ambulatory Visit | Attending: Radiation Oncology | Admitting: Radiation Oncology

## 2021-08-16 DIAGNOSIS — C154 Malignant neoplasm of middle third of esophagus: Secondary | ICD-10-CM | POA: Insufficient documentation

## 2021-08-16 DIAGNOSIS — G4733 Obstructive sleep apnea (adult) (pediatric): Secondary | ICD-10-CM | POA: Diagnosis not present

## 2021-08-16 DIAGNOSIS — Z8542 Personal history of malignant neoplasm of other parts of uterus: Secondary | ICD-10-CM | POA: Diagnosis not present

## 2021-08-16 DIAGNOSIS — M549 Dorsalgia, unspecified: Secondary | ICD-10-CM | POA: Diagnosis not present

## 2021-08-16 DIAGNOSIS — Z510 Encounter for antineoplastic radiation therapy: Secondary | ICD-10-CM | POA: Insufficient documentation

## 2021-08-16 DIAGNOSIS — I771 Stricture of artery: Secondary | ICD-10-CM | POA: Diagnosis not present

## 2021-08-16 DIAGNOSIS — E119 Type 2 diabetes mellitus without complications: Secondary | ICD-10-CM | POA: Diagnosis not present

## 2021-08-16 DIAGNOSIS — C159 Malignant neoplasm of esophagus, unspecified: Secondary | ICD-10-CM | POA: Diagnosis present

## 2021-08-16 DIAGNOSIS — K8050 Calculus of bile duct without cholangitis or cholecystitis without obstruction: Secondary | ICD-10-CM | POA: Diagnosis not present

## 2021-08-16 DIAGNOSIS — I2693 Single subsegmental pulmonary embolism without acute cor pulmonale: Secondary | ICD-10-CM | POA: Diagnosis not present

## 2021-08-16 DIAGNOSIS — G8929 Other chronic pain: Secondary | ICD-10-CM | POA: Diagnosis not present

## 2021-08-16 DIAGNOSIS — Z79899 Other long term (current) drug therapy: Secondary | ICD-10-CM | POA: Diagnosis not present

## 2021-08-16 DIAGNOSIS — Z7901 Long term (current) use of anticoagulants: Secondary | ICD-10-CM | POA: Diagnosis not present

## 2021-08-16 DIAGNOSIS — I10 Essential (primary) hypertension: Secondary | ICD-10-CM | POA: Diagnosis not present

## 2021-08-16 DIAGNOSIS — K219 Gastro-esophageal reflux disease without esophagitis: Secondary | ICD-10-CM | POA: Diagnosis not present

## 2021-08-16 MED ORDER — SUCRALFATE 1 G PO TABS: 1.0000 g | ORAL_TABLET | Freq: Four times a day (QID) | ORAL | 2 refills | Status: DC

## 2021-08-17 ENCOUNTER — Other Ambulatory Visit: Payer: Self-pay

## 2021-08-17 ENCOUNTER — Ambulatory Visit
Admission: RE | Admit: 2021-08-17 | Discharge: 2021-08-17 | Disposition: A | Discharge status: Home / Self Care | Payer: Medicare Other | Source: Ambulatory Visit | Attending: Radiation Oncology | Admitting: Radiation Oncology

## 2021-08-17 DIAGNOSIS — Z510 Encounter for antineoplastic radiation therapy: Secondary | ICD-10-CM | POA: Diagnosis not present

## 2021-08-21 ENCOUNTER — Other Ambulatory Visit: Payer: Self-pay

## 2021-08-21 ENCOUNTER — Encounter: Payer: Self-pay | Admitting: Family Medicine

## 2021-08-21 ENCOUNTER — Ambulatory Visit
Admission: RE | Admit: 2021-08-21 | Discharge: 2021-08-21 | Disposition: A | Discharge status: Home / Self Care | Payer: Medicare Other | Source: Ambulatory Visit | Attending: Radiation Oncology | Admitting: Radiation Oncology

## 2021-08-21 DIAGNOSIS — Z510 Encounter for antineoplastic radiation therapy: Secondary | ICD-10-CM | POA: Diagnosis not present

## 2021-08-21 MED ORDER — VALSARTAN 40 MG PO TABS: 40.0000 mg | ORAL_TABLET | Freq: Every day | ORAL | 1 refills | Status: DC

## 2021-08-22 ENCOUNTER — Other Ambulatory Visit: Payer: Self-pay

## 2021-08-22 ENCOUNTER — Ambulatory Visit
Admission: RE | Admit: 2021-08-22 | Discharge: 2021-08-22 | Disposition: A | Discharge status: Home / Self Care | Payer: Medicare Other | Source: Ambulatory Visit | Attending: Radiation Oncology | Admitting: Radiation Oncology

## 2021-08-22 DIAGNOSIS — Z510 Encounter for antineoplastic radiation therapy: Secondary | ICD-10-CM | POA: Diagnosis not present

## 2021-08-23 ENCOUNTER — Ambulatory Visit
Admission: RE | Admit: 2021-08-23 | Discharge: 2021-08-23 | Disposition: A | Discharge status: Home / Self Care | Payer: Medicare Other | Source: Ambulatory Visit | Attending: Radiation Oncology | Admitting: Radiation Oncology

## 2021-08-23 ENCOUNTER — Other Ambulatory Visit: Payer: Self-pay

## 2021-08-23 DIAGNOSIS — Z510 Encounter for antineoplastic radiation therapy: Secondary | ICD-10-CM | POA: Diagnosis not present

## 2021-08-24 ENCOUNTER — Encounter: Payer: Self-pay | Admitting: Family Medicine

## 2021-08-24 ENCOUNTER — Ambulatory Visit
Admission: RE | Admit: 2021-08-24 | Discharge: 2021-08-24 | Disposition: A | Discharge status: Home / Self Care | Payer: Medicare Other | Source: Ambulatory Visit | Attending: Radiation Oncology | Admitting: Radiation Oncology

## 2021-08-24 ENCOUNTER — Other Ambulatory Visit: Payer: Self-pay | Admitting: Family Medicine

## 2021-08-24 DIAGNOSIS — Z510 Encounter for antineoplastic radiation therapy: Secondary | ICD-10-CM | POA: Diagnosis not present

## 2021-08-24 MED ORDER — LATANOPROST 0.005 % OP SOLN: 1.0000 [drp] | Freq: Every day | OPHTHALMIC | 2 refills | Status: DC

## 2021-08-26 ENCOUNTER — Encounter: Payer: Self-pay | Admitting: Neurology

## 2021-08-26 DIAGNOSIS — Z8501 Personal history of malignant neoplasm of esophagus: Secondary | ICD-10-CM | POA: Insufficient documentation

## 2021-08-26 DIAGNOSIS — R634 Abnormal weight loss: Secondary | ICD-10-CM | POA: Insufficient documentation

## 2021-08-26 NOTE — Progress Notes (Signed)
The total APNEA/HYPOPNEA INDEX (AHI) was 0.5/hour. IMPRESSION:  1. No evidence of any form of sleep apnea at this point- neither Central nor Obstructive Sleep Apnea (OSA) 2. No Periodic Limb Movement Disorder (PLMD) was found.  3. Mild Snoring 4. Normal EKG   RECOMMENDATIONS:  1. Sherry Holloway is no longer in need of CPAP therapy.

## 2021-08-26 NOTE — Procedures (Signed)
PATIENT'S NAME:  Sherry Holloway, Sherry Holloway DOB:      11/15/1928      MR#:    [id number]     DATE OF RECORDING: 08/09/2021 REFERRING M.D.:  Sherry Schanz, DO Study Performed:   Baseline Polysomnogram HISTORY:   Sherry Holloway had a sleep consultation visit on 05-01-2021. 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   The patient had the first sleep study in [place] -perhaps more than 16 years ago.  Started on FFM and presented today with data -the patient has been a compliant CPAP user in the past but it seems that for few months now she had some trouble tolerating the CPAP well- it seems not to work as well for her- she felt she does not get enough air pressure.  The download confirms that she has used the machine 87% out of 30 days but for 9 of those 28 days under 4 hours.  Average user time is 4 hours 44 minutes. There is a narrow pressure window set- minimum pressure of 10 cm and a maximum pressure setting of 14 cmH2O with 2 cm EPR.  The 95th percentile pressure here is 10.2 cmH2O.  Please also note that she has very high air leakage, with use of a full facemask with a 95th percentile being 62 L/min leakage.  Events per hour were rated as an AHI of 4.6 but unknown #4.2 these are created by high air leakage.  There were no clear obstructive apneas remaining and the central apnea count was 0.2/h. The patient's medication list also lists opiate pain medication.   We are meeting to establish a new baseline and to see if she needs the PAP therapy any longer.     The patient endorsed the Epworth Sleepiness Scale at 11 points.   The patient's weight 162 pounds with a height of 64 (inches), resulting in a BMI of 27.5 kg/m2. The patient's neck  circumference measured 15 inches.  CURRENT MEDICATIONS: Tylenol, Bumex, Colace, Famvir, Amaryl, Xalatan, Creon, Remeron, Multiple Vitamins-Minerals, Prilosec, Xarelto, Diovan, MS Contin   PROCEDURE:  This is a multichannel digital polysomnogram utilizing the Somnostar 11.2 system.  Electrodes and sensors were applied and monitored per AASM Specifications.   EEG, EOG, Chin and Limb EMG, were sampled at 200 Hz.  ECG, Snore and Nasal Pressure, Thermal Airflow, Respiratory Effort, CPAP Flow and Pressure, Oximetry was sampled at 50 Hz. Digital video and audio were recorded.      BASELINE STUDY: Lights Out was at 20:56 and Lights On at 05:04.  Total recording time (TRT) was 488.5 minutes, with a total sleep time (TST) of 441.5 minutes. The patient's sleep latency was 24 minutes. REM latency was 198.5 minutes. The sleep efficiency was 90.4 %. There were many spontaneous arousals.    SLEEP ARCHITECTURE: WASO (Wake after sleep onset) was 35 minutes.  There were 64 minutes in Stage N1, 319.5 minutes Stage N2, 30 minutes Stage N3 and 28 minutes in Stage REM.  The percentage of Stage N1 was 14.5%, Stage N2 was 72.4%, Stage N3 was 6.8% and Stage R (REM sleep) was 6.3%.   RESPIRATORY ANALYSIS:  There were a total of 4 respiratory events:  0 obstructive apneas, 0 central apneas and 0 mixed apneas with a total of 0 apneas and an apnea index (AI) of 0 /hour.  There were 4 hypopneas with a hypopnea index of .5 /hour. The patient also had 0 respiratory event related arousals (RERAs).      The total APNEA/HYPOPNEA INDEX (AHI) was 0.5/hour and the total RESPIRATORY DISTURBANCE INDEX was 0 .5 /hour.  0 events occurred in REM sleep and 8 events in NREM. The REM AHI was 0.0 /hour, versus a non-REM AHI of 0.6. The patient spent 373 minutes of total sleep time in the supine position and 69 minutes in non-supine. The supine AHI was 0.6 versus a non-supine AHI of 0.0/h.  OXYGEN SATURATION & C02:  The Wake baseline 02 saturation  was 94%, with the lowest being 83%. Time spent below 89% saturation equaled 22 minutes. The arousals were noted as: 66 were spontaneous, 0 were associated with PLMs, 0 were associated with respiratory events. The patient had a total of 0 Periodic Limb Movements.    Audio and video analysis did not show any abnormal or unusual movements, behaviors, phonations or vocalizations.  The patient slept on one pillow only in Room 2.  Only mild Snoring was noted. EKG was in keeping with normal sinus rhythm (NSR).    IMPRESSION:  No evidence of any form of sleep apnea at this point- neither Central nor Obstructive Sleep Apnea (OSA) No Periodic Limb Movement Disorder (PLMD) was found.  Mild Snoring Normal EKG   RECOMMENDATIONS:  Mrs. Sherry Holloway is no longer in need of CPAP therapy.     I certify that I have reviewed the entire raw data recording prior to the issuance of this report in accordance with the Standards of Accreditation of the [place] [employer] (AASM)    Sherry Seat, MD Medical Director, [facility] [place] [facility] Neu[facility] and Sleep Medicine (Neurology and Sleep Medicine)

## 2021-08-27 ENCOUNTER — Ambulatory Visit
Admission: RE | Admit: 2021-08-27 | Discharge: 2021-08-27 | Disposition: A | Discharge status: Home / Self Care | Payer: Medicare Other | Source: Ambulatory Visit | Attending: Radiation Oncology | Admitting: Radiation Oncology

## 2021-08-27 ENCOUNTER — Other Ambulatory Visit: Payer: Self-pay

## 2021-08-27 DIAGNOSIS — Z510 Encounter for antineoplastic radiation therapy: Secondary | ICD-10-CM | POA: Diagnosis not present

## 2021-08-27 NOTE — Telephone Encounter (Signed)
I returned the call to the patient's daughter. States the machine is greater than 85 years old. She originally received it from a DME company in [place]. She heard that Medicare makes you return the machine. I am fairly certain that with the age of the equipment that this is not necessary. She is going to contact Medicare to be certain.

## 2021-08-28 ENCOUNTER — Ambulatory Visit
Admission: RE | Admit: 2021-08-28 | Discharge: 2021-08-28 | Disposition: A | Discharge status: Home / Self Care | Payer: Medicare Other | Source: Ambulatory Visit | Attending: Radiation Oncology | Admitting: Radiation Oncology

## 2021-08-28 ENCOUNTER — Telehealth: Payer: Self-pay

## 2021-08-28 DIAGNOSIS — Z510 Encounter for antineoplastic radiation therapy: Secondary | ICD-10-CM | POA: Diagnosis not present

## 2021-08-28 NOTE — Telephone Encounter (Signed)
I called patient to discuss. No answer, left a voicemail asking her to call me back.

## 2021-08-28 NOTE — Telephone Encounter (Signed)
-----   Message from Larey Seat, MD sent at 08/26/2021  4:53 PM EDT ----- The total APNEA/HYPOPNEA INDEX (AHI) was 0.5/hour. IMPRESSION:  1. No evidence of any form of sleep apnea at this point- neither Central nor Obstructive Sleep Apnea (OSA) 2. No Periodic Limb Movement Disorder (PLMD) was found.  3. Mild Snoring 4. Normal EKG   RECOMMENDATIONS:  1. Sherry Holloway is no longer in need of CPAP therapy.

## 2021-08-29 ENCOUNTER — Inpatient Hospital Stay: Payer: Medicare Other

## 2021-08-29 ENCOUNTER — Inpatient Hospital Stay: Payer: Medicare Other | Attending: Oncology | Admitting: Oncology

## 2021-08-29 ENCOUNTER — Ambulatory Visit
Admission: RE | Admit: 2021-08-29 | Discharge: 2021-08-29 | Disposition: A | Discharge status: Home / Self Care | Payer: Medicare Other | Source: Ambulatory Visit | Attending: Radiation Oncology | Admitting: Radiation Oncology

## 2021-08-29 ENCOUNTER — Encounter: Payer: Self-pay | Admitting: Neurology

## 2021-08-29 ENCOUNTER — Other Ambulatory Visit: Payer: Self-pay

## 2021-08-29 VITALS — BP 147/52 | HR 98 | Temp 97.9°F | Resp 18 | Ht 64.0 in | Wt 163.2 lb

## 2021-08-29 DIAGNOSIS — I10 Essential (primary) hypertension: Secondary | ICD-10-CM | POA: Insufficient documentation

## 2021-08-29 DIAGNOSIS — E119 Type 2 diabetes mellitus without complications: Secondary | ICD-10-CM | POA: Insufficient documentation

## 2021-08-29 DIAGNOSIS — C154 Malignant neoplasm of middle third of esophagus: Secondary | ICD-10-CM

## 2021-08-29 DIAGNOSIS — I2693 Single subsegmental pulmonary embolism without acute cor pulmonale: Secondary | ICD-10-CM | POA: Insufficient documentation

## 2021-08-29 DIAGNOSIS — Z79899 Other long term (current) drug therapy: Secondary | ICD-10-CM | POA: Insufficient documentation

## 2021-08-29 DIAGNOSIS — G8929 Other chronic pain: Secondary | ICD-10-CM | POA: Insufficient documentation

## 2021-08-29 DIAGNOSIS — I771 Stricture of artery: Secondary | ICD-10-CM | POA: Insufficient documentation

## 2021-08-29 DIAGNOSIS — M549 Dorsalgia, unspecified: Secondary | ICD-10-CM | POA: Insufficient documentation

## 2021-08-29 DIAGNOSIS — G4733 Obstructive sleep apnea (adult) (pediatric): Secondary | ICD-10-CM | POA: Insufficient documentation

## 2021-08-29 DIAGNOSIS — Z7901 Long term (current) use of anticoagulants: Secondary | ICD-10-CM | POA: Insufficient documentation

## 2021-08-29 DIAGNOSIS — C159 Malignant neoplasm of esophagus, unspecified: Secondary | ICD-10-CM | POA: Insufficient documentation

## 2021-08-29 DIAGNOSIS — Z8542 Personal history of malignant neoplasm of other parts of uterus: Secondary | ICD-10-CM | POA: Insufficient documentation

## 2021-08-29 DIAGNOSIS — K219 Gastro-esophageal reflux disease without esophagitis: Secondary | ICD-10-CM | POA: Insufficient documentation

## 2021-08-29 DIAGNOSIS — K8050 Calculus of bile duct without cholangitis or cholecystitis without obstruction: Secondary | ICD-10-CM | POA: Insufficient documentation

## 2021-08-29 DIAGNOSIS — Z510 Encounter for antineoplastic radiation therapy: Secondary | ICD-10-CM | POA: Diagnosis not present

## 2021-08-29 LAB — CMP (CANCER CENTER ONLY)
ALT: 15 U/L (ref 0–44)
AST: 11 U/L — ABNORMAL LOW (ref 15–41)
Albumin: 4.1 g/dL (ref 3.5–5.0)
Alkaline Phosphatase: 64 U/L (ref 38–126)
Anion gap: 7 (ref 5–15)
BUN: 22 mg/dL (ref 8–23)
CO2: 30 mmol/L (ref 22–32)
Calcium: 9.7 mg/dL (ref 8.9–10.3)
Chloride: 101 mmol/L (ref 98–111)
Creatinine: 1.11 mg/dL — ABNORMAL HIGH (ref 0.44–1.00)
GFR, Estimated: 47 mL/min — ABNORMAL LOW (ref >60)
Glucose, Bld: 257 mg/dL — ABNORMAL HIGH (ref 70–99)
Potassium: 4.1 mmol/L (ref 3.5–5.1)
Sodium: 138 mmol/L (ref 135–145)
Total Bilirubin: 0.5 mg/dL (ref 0.3–1.2)
Total Protein: 6.7 g/dL (ref 6.5–8.1)

## 2021-08-29 LAB — CBC WITH DIFFERENTIAL (CANCER CENTER ONLY)
Abs Immature Granulocytes: 0.02 10*3/uL (ref 0.00–0.07)
Basophils Absolute: 0 10*3/uL (ref 0.0–0.1)
Basophils Relative: 1 %
Eosinophils Absolute: 0 10*3/uL (ref 0.0–0.5)
Eosinophils Relative: 1 %
HCT: 36.4 % (ref 36.0–46.0)
Hemoglobin: 11.8 g/dL — ABNORMAL LOW (ref 12.0–15.0)
Immature Granulocytes: 0 %
Lymphocytes Relative: 14 %
Lymphs Abs: 0.7 10*3/uL (ref 0.7–4.0)
MCH: 29.6 pg (ref 26.0–34.0)
MCHC: 32.4 g/dL (ref 30.0–36.0)
MCV: 91.2 fL (ref 80.0–100.0)
Monocytes Absolute: 0.4 10*3/uL (ref 0.1–1.0)
Monocytes Relative: 8 %
Neutro Abs: 3.8 10*3/uL (ref 1.7–7.7)
Neutrophils Relative %: 76 %
Platelet Count: 153 10*3/uL (ref 150–400)
RBC: 3.99 MIL/uL (ref 3.87–5.11)
RDW: 18.4 % — ABNORMAL HIGH (ref 11.5–15.5)
WBC Count: 4.9 10*3/uL (ref 4.0–10.5)
nRBC: 0 % (ref 0.0–0.2)

## 2021-08-29 NOTE — Progress Notes (Signed)
Sherry Holloway   Diagnosis: Esophagus cancer  INTERVAL HISTORY:   Sherry Holloway returns as scheduled.  She is here with her daughter.  She continues Xeloda and radiation.  No mouth sores, nausea, diarrhea, or hand/foot pain.  She has mild odynophagia with cold liquids and certain foods.  No dysphagia.  Objective:  Vital signs in last 24 hours:  Blood pressure (!) 147/52, pulse 98, temperature 97.9 F (36.6 C), temperature source Oral, resp. rate 18, height '5\' 4"'$  (1.626 m), weight 163 lb 3.2 oz (74 kg), SpO2 100 %.    HEENT: No thrush or ulcers Resp: Lungs clear bilaterally Cardio: Regular rate and rhythm GI: No hepatosplenomegaly, nontender Vascular: No leg edema  Skin: Dryness of the palms and soles, no erythema or skin breakdown   Lab Results:  Lab Results  Component Value Date   WBC 4.9 08/29/2021   HGB 11.8 (L) 08/29/2021   HCT 36.4 08/29/2021   MCV 91.2 08/29/2021   PLT 153 08/29/2021   NEUTROABS 3.8 08/29/2021    CMP  Lab Results  Component Value Date   NA 138 08/29/2021   K 4.1 08/29/2021   CL 101 08/29/2021   CO2 30 08/29/2021   GLUCOSE 257 (H) 08/29/2021   BUN 22 08/29/2021   CREATININE 1.11 (H) 08/29/2021   CALCIUM 9.7 08/29/2021   PROT 6.7 08/29/2021   ALBUMIN 4.1 08/29/2021   AST 11 (L) 08/29/2021   ALT 15 08/29/2021   ALKPHOS 64 08/29/2021   BILITOT 0.5 08/29/2021   GFRNONAA 47 (L) 08/29/2021     Medications: I have reviewed the patient's current medications.   Assessment/Plan: Esophagus cancer Upper EUS 03/05/2021-small submucosal ulcerating nodule with no bleeding and no stigmata of recent bleeding in the midesophagus, 29 to 31 cm from the incisors.  Lesion nonobstructing and not circumferential.  Patchy mildly erythematous mucosa without bleeding was found in the gastric body and gastric antrum.  The esophagus lesion was staged T1sm N0 MX.  Pancreatic parenchymal abnormalities consisting of atrophy were  noted in the entire pancreas.  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  There was dilation of the common bile duct.  No malignant appearing lymph nodes were visualized in the middle paraesophageal mediastinum, lower paraesophageal mediastinum, celiac region, peripancreatic region and porta hepatis region.  Esophagus biopsy showed invasive carcinoma.  Stomach biopsy showed reactive gastropathy; no intestinal metaplasia, dysplasia or malignancy. PET 04/02/2021- low-level hypermetabolism associated with a 0.7 cm nodule at the mid thoracic esophagus, hypermetabolic cystic pancreas head mass with diffuse intrahepatic and extra fatigability duct dilatation compatible with malignant main duct IPMN, no evidence of metastatic disease CT chest and pelvis 03/16/2021-nonocclusive right lower lobe pulmonary embolus extending into segmental branches of the right lower lobe.  Nodular area at the left lung base with bandlike changes that extend from this area on the current study and on the prior study.  This may represent a combination of scarring and atelectasis.  Measures 1.4 x 1.2 cm, previously 2.1 x 1.9 cm.  Suggestion of slight interval decrease in size as well of a right lower lobe process with nodular features.  Persistent marked biliary duct distention. Radiation/Xeloda 07/26/2021 CT scan of the abdomen on 01/25/2021 due to history of a pancreatic mass.  The CT showed gross intra and extrahepatic biliary ductal dilatation, the central common bile  duct appeared to contain a poorly calcified calculus or soft tissue nodule measuring approximately 1.9 cm.  There was diffuse atrophy of the pancreatic parenchyma and dilatation of the main pancreatic duct.  There was a hypodense mass or gross pancreatic ductal dilatation within the pancreatic head measuring 3.5 x 2.0 x  2.0 cm.  Nodular appearing consolidation of the dependent left lung base measuring 2.4 x 1.8 cm, nonspecific. ERCP by Dr. Rush Landmark on 03/05/2021.  The major papilla was located entirely within a pantaloon diverticulum.  Prior biliary sphincterotomy appeared open.  Pancreatic duct fishmouth deformity noted.  Filling defects consistent with stones and sludge were seen on the cholangiogram.  The entire main bile duct was severely dilated.  Choledocholithiasis was found.  Biliary sphincteroplasty performed.  Electrohydraulic lithotripsy of a remaining stone performed.   Pulmonary embolus on chest CT 03/15/2021-on Xarelto.  Bilateral lower extremity venous Doppler study with no significant femoral-popliteal DVT.  Limited assessment of the calf veins.  5.7 cm left popliteal fossa Baker's cyst. GERD Hypertension Diabetes OSA Subclavian arterial stenosis History of uterine cancer Chronic back pain maintained on MS Contin, followed by Dr. Nelva Bush IPMN- diagnosed in July 2020 Biliary stones-status post ERCP/sphincterotomy 07/14/2019 for removal of stones      Disposition: Sherry Holloway appears stable.  She is tolerating the Xeloda and radiation well.  She is scheduled to complete treatment on 09/04/2021.  She will return for an office and lab visit in approximately 1 month.  We will plan for a restaging chest CT 3 months after completing radiation.  Betsy Coder, MD  08/29/2021  12:09 PM

## 2021-08-30 ENCOUNTER — Ambulatory Visit
Admission: RE | Admit: 2021-08-30 | Discharge: 2021-08-30 | Disposition: A | Discharge status: Home / Self Care | Payer: Medicare Other | Source: Ambulatory Visit | Attending: Radiation Oncology | Admitting: Radiation Oncology

## 2021-08-30 DIAGNOSIS — Z510 Encounter for antineoplastic radiation therapy: Secondary | ICD-10-CM | POA: Diagnosis not present

## 2021-08-31 ENCOUNTER — Other Ambulatory Visit: Payer: Self-pay

## 2021-08-31 ENCOUNTER — Ambulatory Visit
Admission: RE | Admit: 2021-08-31 | Discharge: 2021-08-31 | Disposition: A | Discharge status: Home / Self Care | Payer: Medicare Other | Source: Ambulatory Visit | Attending: Radiation Oncology | Admitting: Radiation Oncology

## 2021-08-31 DIAGNOSIS — Z510 Encounter for antineoplastic radiation therapy: Secondary | ICD-10-CM | POA: Diagnosis not present

## 2021-09-03 ENCOUNTER — Ambulatory Visit
Admission: RE | Admit: 2021-09-03 | Discharge: 2021-09-03 | Disposition: A | Discharge status: Home / Self Care | Payer: Medicare Other | Source: Ambulatory Visit | Attending: Radiation Oncology | Admitting: Radiation Oncology

## 2021-09-03 ENCOUNTER — Other Ambulatory Visit: Payer: Self-pay

## 2021-09-03 DIAGNOSIS — Z510 Encounter for antineoplastic radiation therapy: Secondary | ICD-10-CM | POA: Diagnosis not present

## 2021-09-04 ENCOUNTER — Other Ambulatory Visit: Payer: Self-pay

## 2021-09-04 ENCOUNTER — Ambulatory Visit
Admission: RE | Admit: 2021-09-04 | Discharge: 2021-09-04 | Disposition: A | Discharge status: Home / Self Care | Payer: Medicare Other | Source: Ambulatory Visit | Attending: Radiation Oncology | Admitting: Radiation Oncology

## 2021-09-04 ENCOUNTER — Encounter: Payer: Self-pay | Admitting: Radiation Oncology

## 2021-09-04 DIAGNOSIS — Z510 Encounter for antineoplastic radiation therapy: Secondary | ICD-10-CM | POA: Diagnosis not present

## 2021-09-05 ENCOUNTER — Encounter: Payer: Self-pay | Admitting: *Deleted

## 2021-09-05 NOTE — Progress Notes (Unsigned)
Faxed completed FMLA extension for daughter, Harlene Salts to [employer] [phone] att: Tandy Gaw. Copy to HIM and will provide copy to patient/daughter at next visit.

## 2021-09-06 ENCOUNTER — Other Ambulatory Visit (HOSPITAL_COMMUNITY): Payer: Self-pay

## 2021-09-17 NOTE — Progress Notes (Signed)
                                                                                                                                                             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  Intent: Curative  Radiation Treatment Dates: 07/26/2021 through 09/04/2021 Site Technique Total Dose (Gy) Dose per Fx (Gy) Completed Fx Beam Energies  Esophagus: Esoph IMRT 45/45 1.8 25/25 6X  Esophagus: Esoph_Bst IMRT 5.4/5.4 1.8 3/3 6X   Narrative: The patient tolerated radiation therapy relatively well. She developed fatigue during therapy but did not complain of esophagitis during treatment.   Plan: The patient will receive a call in about one month from the radiation oncology department. She will continue follow up with Dr. Benay Spice as well.   ________________________________________________    Carola Rhine, [facility]

## 2021-09-20 ENCOUNTER — Other Ambulatory Visit: Payer: Self-pay | Admitting: Family Medicine

## 2021-09-20 DIAGNOSIS — K2100 Gastro-esophageal reflux disease with esophagitis, without bleeding: Secondary | ICD-10-CM

## 2021-09-24 ENCOUNTER — Encounter: Payer: Self-pay | Admitting: Family Medicine

## 2021-09-24 NOTE — Telephone Encounter (Signed)
Can you confirm the twice daily?

## 2021-09-25 MED ORDER — GLIMEPIRIDE 4 MG PO TABS
4.0000 mg | ORAL_TABLET | Freq: Two times a day (BID) | ORAL | 1 refills | Status: DC
Start: 2021-09-25 — End: 2021-10-01

## 2021-10-01 ENCOUNTER — Telehealth: Payer: Self-pay

## 2021-10-01 ENCOUNTER — Other Ambulatory Visit: Payer: Self-pay

## 2021-10-01 ENCOUNTER — Inpatient Hospital Stay: Payer: Medicare Other | Attending: Oncology | Admitting: Oncology

## 2021-10-01 ENCOUNTER — Inpatient Hospital Stay: Payer: Medicare Other

## 2021-10-01 ENCOUNTER — Ambulatory Visit
Admission: RE | Admit: 2021-10-01 | Discharge: 2021-10-01 | Disposition: A | Discharge status: Home / Self Care | Payer: Medicare Other | Source: Ambulatory Visit | Attending: Radiation Oncology | Admitting: Radiation Oncology

## 2021-10-01 VITALS — BP 185/50 | HR 72 | Temp 98.7°F | Resp 18 | Ht 64.0 in | Wt 166.0 lb

## 2021-10-01 DIAGNOSIS — C154 Malignant neoplasm of middle third of esophagus: Secondary | ICD-10-CM

## 2021-10-01 DIAGNOSIS — C159 Malignant neoplasm of esophagus, unspecified: Secondary | ICD-10-CM | POA: Diagnosis present

## 2021-10-01 DIAGNOSIS — Z923 Personal history of irradiation: Secondary | ICD-10-CM | POA: Insufficient documentation

## 2021-10-01 LAB — CMP (CANCER CENTER ONLY)
ALT: 16 U/L (ref 0–44)
AST: 11 U/L — ABNORMAL LOW (ref 15–41)
Albumin: 3.9 g/dL (ref 3.5–5.0)
Alkaline Phosphatase: 70 U/L (ref 38–126)
Anion gap: 7 (ref 5–15)
BUN: 18 mg/dL (ref 8–23)
CO2: 27 mmol/L (ref 22–32)
Calcium: 9.4 mg/dL (ref 8.9–10.3)
Chloride: 106 mmol/L (ref 98–111)
Creatinine: 0.81 mg/dL (ref 0.44–1.00)
GFR, Estimated: >60 mL/min (ref >60)
Glucose, Bld: 156 mg/dL — ABNORMAL HIGH (ref 70–99)
Potassium: 4.1 mmol/L (ref 3.5–5.1)
Sodium: 140 mmol/L (ref 135–145)
Total Bilirubin: 0.4 mg/dL (ref 0.3–1.2)
Total Protein: 6.4 g/dL — ABNORMAL LOW (ref 6.5–8.1)

## 2021-10-01 LAB — CBC WITH DIFFERENTIAL (CANCER CENTER ONLY)
Abs Immature Granulocytes: 0.03 10*3/uL (ref 0.00–0.07)
Basophils Absolute: 0 10*3/uL (ref 0.0–0.1)
Basophils Relative: 1 %
Eosinophils Absolute: 0.1 10*3/uL (ref 0.0–0.5)
Eosinophils Relative: 2 %
HCT: 36 % (ref 36.0–46.0)
Hemoglobin: 11.9 g/dL — ABNORMAL LOW (ref 12.0–15.0)
Immature Granulocytes: 1 %
Lymphocytes Relative: 20 %
Lymphs Abs: 1.2 10*3/uL (ref 0.7–4.0)
MCH: 30.8 pg (ref 26.0–34.0)
MCHC: 33.1 g/dL (ref 30.0–36.0)
MCV: 93.3 fL (ref 80.0–100.0)
Monocytes Absolute: 0.5 10*3/uL (ref 0.1–1.0)
Monocytes Relative: 7 %
Neutro Abs: 4.3 10*3/uL (ref 1.7–7.7)
Neutrophils Relative %: 69 %
Platelet Count: 187 10*3/uL (ref 150–400)
RBC: 3.86 MIL/uL — ABNORMAL LOW (ref 3.87–5.11)
RDW: 17.2 % — ABNORMAL HIGH (ref 11.5–15.5)
WBC Count: 6.1 10*3/uL (ref 4.0–10.5)
nRBC: 0 % (ref 0.0–0.2)

## 2021-10-01 NOTE — Progress Notes (Signed)
[place] OFFICE PROGRESS NOTE   Diagnosis: Esophagus cancer  INTERVAL HISTORY:   Ms. Vipond completed radiation and Xeloda on 09/04/2021.  No mouth sores, nausea, odynophagia, dysphagia, or diarrhea.  She has developed darkening of the skin at the hands and feet.  No pain.  Objective:  Vital signs in last 24 hours:  Blood pressure (!) 185/50, pulse 72, temperature 98.7 F (37.1 C), temperature source Oral, resp. rate 18, height 5\' 4"  (1.626 m), weight 166 lb (75.3 kg), SpO2 98 %.    HEENT: No thrush or ulcers Resp: Lungs clear bilaterally Cardio: Regular rate and rhythm GI: No hepatosplenomegaly, no mass, nontender Vascular: No leg edema  Skin: Dryness and hyperpigmentation of the palms and soles, no erythema or skin breakdown   Lab Results:  Lab Results  Component Value Date   WBC 6.1 10/01/2021   HGB 11.9 (L) 10/01/2021   HCT 36.0 10/01/2021   MCV 93.3 10/01/2021   PLT 187 10/01/2021   NEUTROABS 4.3 10/01/2021    CMP  Lab Results  Component Value Date   NA 140 10/01/2021   K 4.1 10/01/2021   CL 106 10/01/2021   CO2 27 10/01/2021   GLUCOSE 156 (H) 10/01/2021   BUN 18 10/01/2021   CREATININE 0.81 10/01/2021   CALCIUM 9.4 10/01/2021   PROT 6.4 (L) 10/01/2021   ALBUMIN 3.9 10/01/2021   AST 11 (L) 10/01/2021   ALT 16 10/01/2021   ALKPHOS 70 10/01/2021   BILITOT 0.4 10/01/2021   GFRNONAA >60 10/01/2021    Medications: I have reviewed the patient's current medications.   Assessment/Plan: Esophagus cancer Upper EUS 03/05/2021-small submucosal ulcerating nodule with no bleeding and no stigmata of recent bleeding in the midesophagus, 29 to 31 cm from the incisors.  Lesion nonobstructing and not circumferential.  Patchy mildly erythematous mucosa without bleeding was found in the gastric body and gastric antrum.  The esophagus lesion was staged T1sm N0 MX.  Pancreatic parenchymal abnormalities consisting of atrophy were noted in the entire  pancreas.  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  There was dilation of the common bile duct.  No malignant appearing lymph nodes were visualized in the middle paraesophageal mediastinum, lower paraesophageal mediastinum, celiac region, peripancreatic region and porta hepatis region.  Esophagus biopsy showed invasive carcinoma.  Stomach biopsy showed reactive gastropathy; no intestinal metaplasia, dysplasia or malignancy. PET 04/02/2021- low-level hypermetabolism associated with a 0.7 cm nodule at the mid thoracic esophagus, hypermetabolic cystic pancreas head mass with diffuse intrahepatic and extra fatigability duct dilatation compatible with malignant main duct IPMN, no evidence of metastatic disease CT chest and pelvis 03/16/2021-nonocclusive right lower lobe pulmonary embolus extending into segmental branches of the right lower lobe.  Nodular area at the left lung base with bandlike changes that extend from this area on the current study and on the prior study.  This may represent a combination of scarring and atelectasis.  Measures 1.4 x 1.2 cm, previously 2.1 x 1.9 cm.  Suggestion of slight interval decrease in size as well of a right lower lobe process with nodular features.  Persistent marked biliary duct distention. Radiation/Xeloda 07/26/2021-09/04/2021 CT scan of the abdomen on 01/25/2021 due to history of a pancreatic mass.  The CT showed gross intra and extrahepatic biliary ductal dilatation, the central common bile duct appeared to contain a poorly calcified calculus  or soft tissue nodule measuring approximately 1.9 cm.  There was diffuse atrophy of the pancreatic parenchyma and dilatation of the main pancreatic duct.  There was a hypodense mass or gross pancreatic ductal dilatation within the pancreatic head measuring 3.5 x 2.0 x 2.0 cm.   Nodular appearing consolidation of the dependent left lung base measuring 2.4 x 1.8 cm, nonspecific. ERCP by Dr. Rush Landmark on 03/05/2021.  The major papilla was located entirely within a pantaloon diverticulum.  Prior biliary sphincterotomy appeared open.  Pancreatic duct fishmouth deformity noted.  Filling defects consistent with stones and sludge were seen on the cholangiogram.  The entire main bile duct was severely dilated.  Choledocholithiasis was found.  Biliary sphincteroplasty performed.  Electrohydraulic lithotripsy of a remaining stone performed.   Pulmonary embolus on chest CT 03/15/2021-on Xarelto.  Bilateral lower extremity venous Doppler study with no significant femoral-popliteal DVT.  Limited assessment of the calf veins.  5.7 cm left popliteal fossa Baker's cyst. GERD Hypertension Diabetes OSA Subclavian arterial stenosis History of uterine cancer Chronic back pain maintained on MS Contin, followed by Dr. Nelva Bush IPMN- diagnosed in July 2020 Biliary stones-status post ERCP/sphincterotomy 07/14/2019 for removal of stones       Disposition: Ms.Desanctis completed the course of Xeloda and radiation.  She tolerated the treatment well.  She has mild hyperpigmentation of the hands and feet.  This should improve over the next several weeks.  I will refer her to Dr. Rush Landmark for a surveillance endoscopy.  She continues Xarelto anticoagulation after being diagnosed with a pulmonary embolism in March.  She will return for an office visit in 3 months.  Betsy Coder, MD  10/01/2021  3:56 PM

## 2021-10-01 NOTE — Progress Notes (Signed)
  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  Interval Since Last Radiation:  4 weeks   07/26/2021 through 09/04/2021 Site Technique Total Dose (Gy) Dose per Fx (Gy) Completed Fx Beam Energies  Esophagus: Esoph IMRT 45/45 1.8 25/25 6X  Esophagus: Esoph_Bst IMRT 5.4/5.4 1.8 3/3 6X    Narrative:  The patient was contacted today for routine follow-up. During treatment she did very well with radiotherapy and did not have significant desquamation.    Impression/Plan: 1. 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 I was unable to reach the patient but left a voicemail for the patient's daughter, and on the message, I discussed that we would be happy to continue to follow her as needed, but she will also continue to follow up with Dr. Benay Spice in medical oncology.      Carola Rhine, PAC

## 2021-10-01 NOTE — Telephone Encounter (Signed)
Per Sherry Holloway pt is cleared for [place].  Will get the pt scheduled.

## 2021-10-02 ENCOUNTER — Encounter: Payer: Self-pay | Admitting: *Deleted

## 2021-10-02 NOTE — Telephone Encounter (Signed)
Tried to reach pt on 2 separate occasions and the line rings then hangs up will try later

## 2021-10-02 NOTE — Progress Notes (Signed)
Referral order faxed to [facility] for Dr. Rush Landmark for repeat endoscopy-surveillance. RT completed in September.

## 2021-10-03 IMAGING — PT NM PET TUM IMG INITIAL (PI) SKULL BASE T - THIGH
1 series · 3 of 3 positions shown · non-contrast
Comparison: 03/15/2021 chest and pelvic CT.

CLINICAL DATA: Initial treatment strategy for squamous cell
carcinoma of the middle third of the esophagus. History of
pancreatic mass with likely DAVYDE per [REDACTED] note.

EXAM:
NUCLEAR MEDICINE PET SKULL BASE TO THIGH
TECHNIQUE: 8.0 mCi F-18 FDG was injected intravenously. Full-ring PET imaging
was performed from the skull base to thigh after the radiotracer. CT
data was obtained and used for attenuation correction and anatomic
localization.
Fasting blood glucose: 115 mg/dl

[Series 1086: results mm oncology reading · 1.0mm · 0.45mm/px · 3 of 3 slices shown]
[im 1/3]
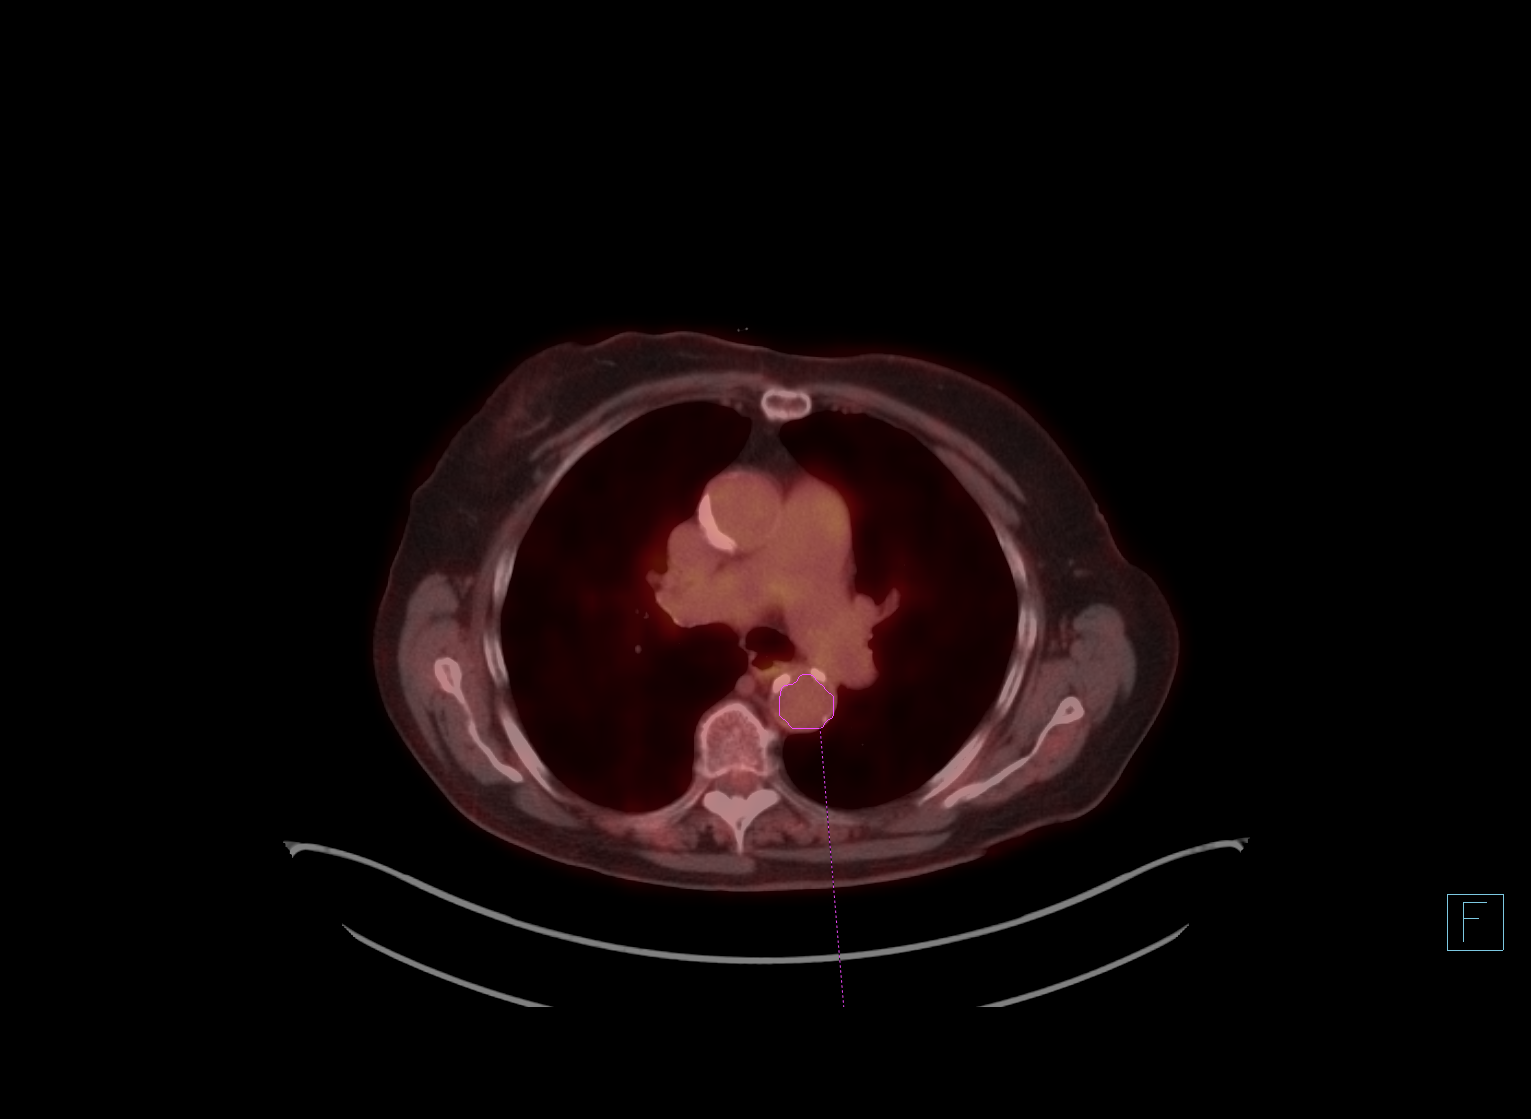
[im 2/3]
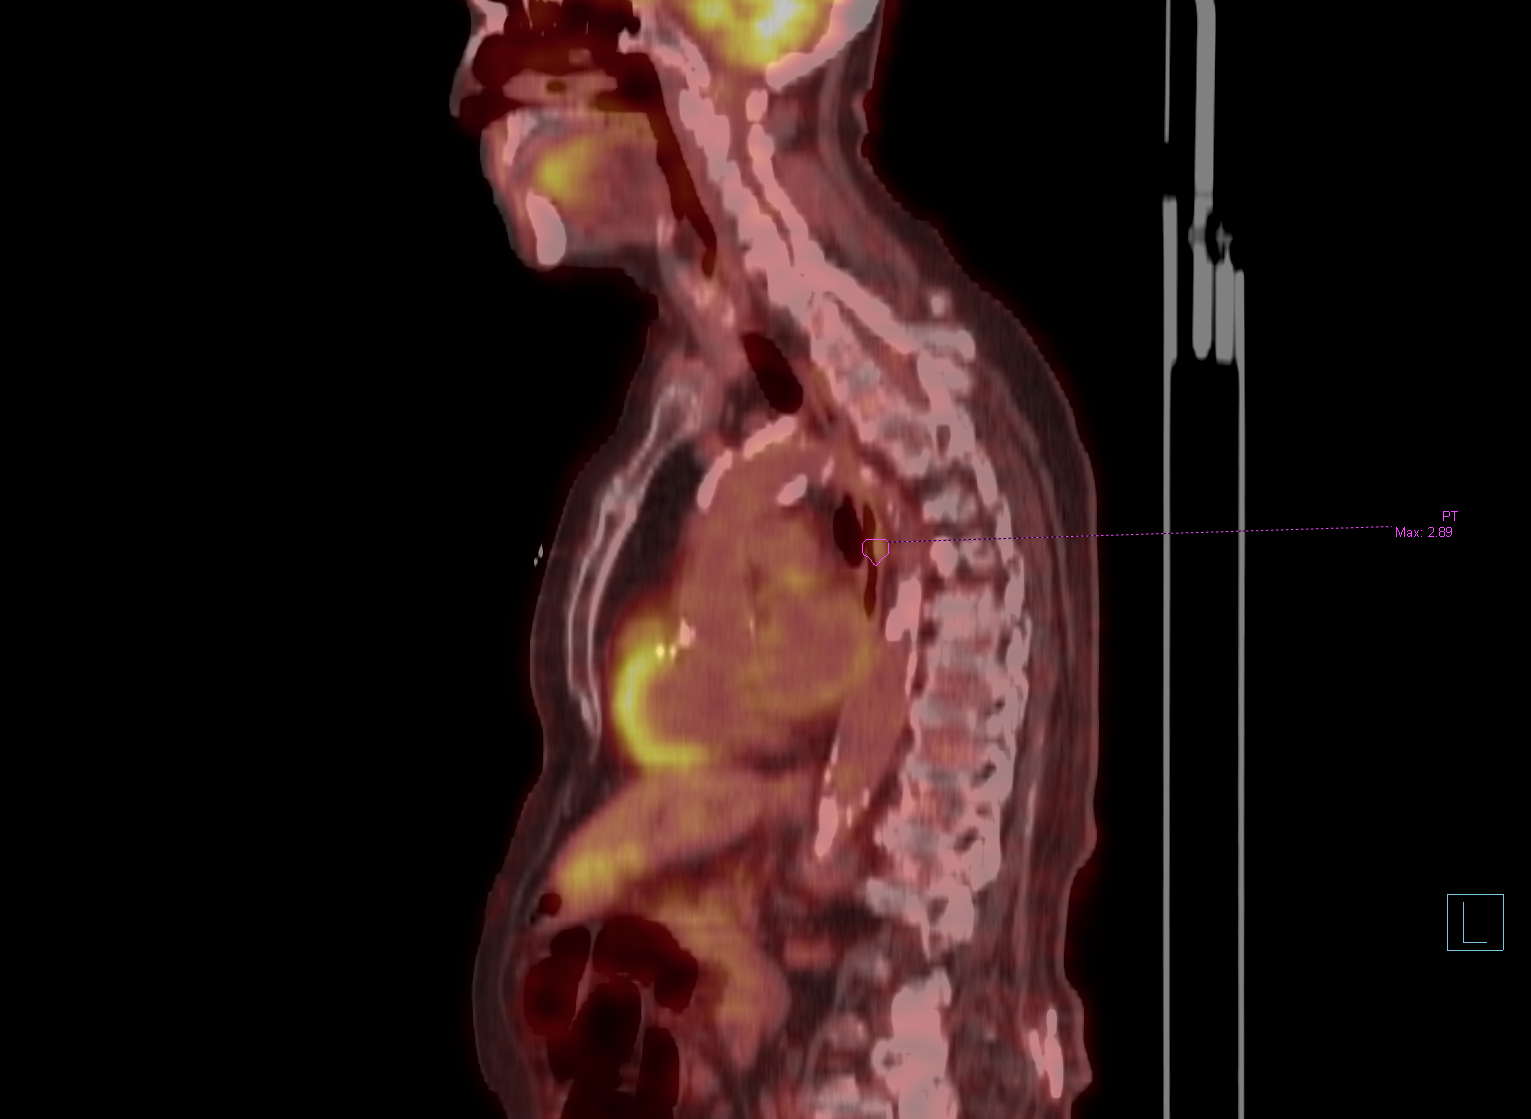
[im 3/3]
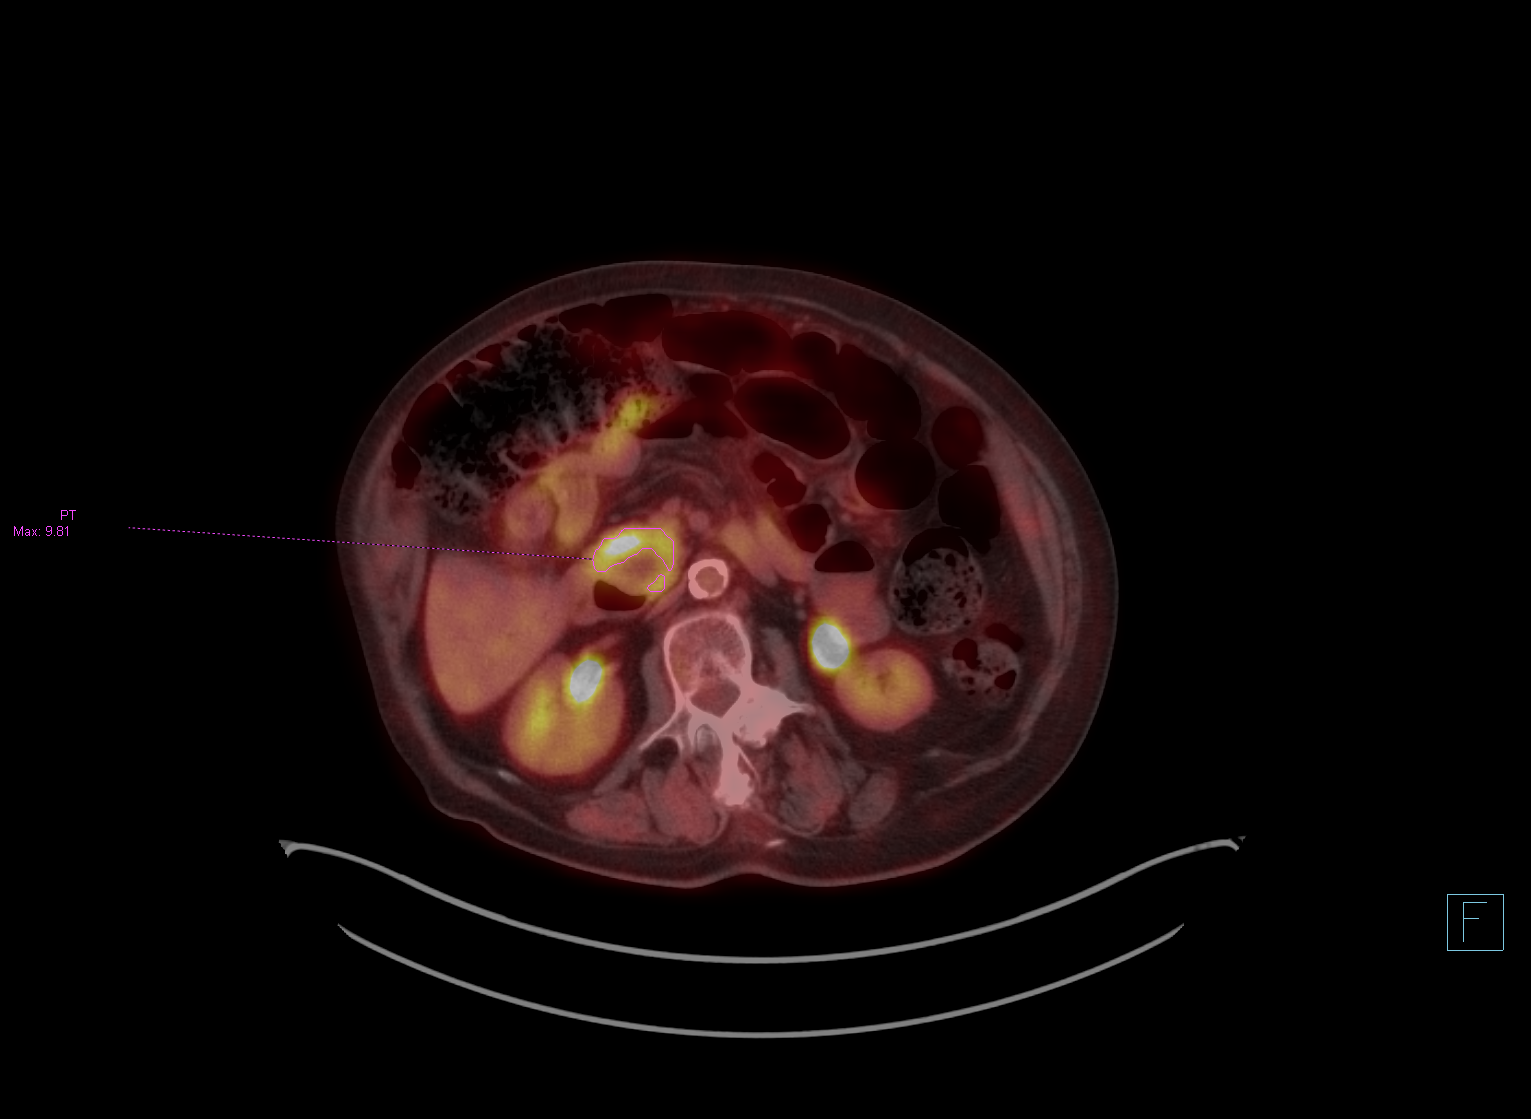

[3 of 3 positions shown; findings below may reference images not displayed]

FINDINGS: Mediastinal blood pool activity: SUV max

Liver activity: SUV max NA

NECK: No hypermetabolic lymph nodes in the neck.

Incidental CT findings: Subcentimeter hypodense left thyroid nodule,
non hypermetabolic. Not clinically significant; no follow-up imaging
recommended (ref: [HOSPITAL]. [DATE]): 143-50).

CHEST:

Low level FDG uptake associated with a small 0.7 cm nodular focus of
wall thickening in the posterior wall of the middle third of the
thoracic esophagus with max SUV 2.9 (series 4/image 68).

No enlarged or hypermetabolic axillary, mediastinal or hilar lymph
nodes. No hypermetabolic pulmonary findings. Nodular foci
consolidation in the bilateral lower lobes measuring 1.4 cm medially
on the right (series 8/image 46) and 1.7 cm on the left (series
8/image 52) demonstrate no significant FDG uptake.

Incidental CT findings: Atherosclerotic nonaneurysmal thoracic
aorta. Top-normal caliber main pulmonary artery (3.3 cm diameter).
Coronary atherosclerosis. Top-normal heart size.

ABDOMEN/PELVIS:

Hypermetabolism throughout the periphery of the cystic 3.0 x 2.5 cm
pancreatic head mass (series 4/image 122) with max SUV 9.8. Marked
diffuse intrahepatic and extrahepatic biliary ductal dilatation with
CBD diameter 20 mm. Diffuse main pancreatic duct dilation up to 14
mm diameter with associated diffuse pancreatic parenchymal atrophy,
unchanged.

No abnormal hypermetabolic activity within the liver, adrenal
glands, or spleen. No hypermetabolic lymph nodes in the abdomen or
pelvis.

Incidental CT findings: Large diffuse colonic stool volume. Moderate
sigmoid diverticulosis. Atherosclerotic nonaneurysmal abdominal
aorta. Spine spinal stimulator device in the lower right flank with
lead terminating in the posterior midthoracic spinal canal.

SKELETON: No focal hypermetabolic activity to suggest skeletal
metastasis.

Incidental CT findings: Left total hip arthroplasty.
IMPRESSION: 1. Hypermetabolism throughout the periphery of the cystic 3.0 cm
pancreatic head mass with associated marked diffuse intrahepatic and
extrahepatic biliary ductal dilatation and diffuse pancreatic duct
dilation and pancreatic atrophy. Findings are compatible with
malignant main duct IPMN.
2. Low level hypermetabolism (max SUV 2.9) associated with a small
0.7 cm nodular focus of wall thickening in the posterior wall of the
middle third of the thoracic esophagus, potentially representing the
known primary esophageal neoplasm.
3. No evidence of hypermetabolic metastatic disease.
4. No significant FDG uptake associated with the nodular foci of
consolidation in the bilateral lower lung lobes, which are probably
benign foci of nodular scarring.
5. Chronic findings include: Aortic Atherosclerosis ([id number].I).
Coronary atherosclerosis. Diffuse large colonic stool volume,
suggesting constipation. Moderate sigmoid diverticulosis.

## 2021-10-03 NOTE — Telephone Encounter (Signed)
Left message on machine to call back  

## 2021-10-04 NOTE — Telephone Encounter (Signed)
I spoke with the pt's daughter and she prefers to wait until January to make an appt.  She will call back to set that up later this year.  The January schedule is not out as of today.  Will send a message to myself to make sure the pt has been scheduled.

## 2021-10-22 ENCOUNTER — Other Ambulatory Visit: Payer: Self-pay | Admitting: Family Medicine

## 2021-11-20 ENCOUNTER — Telehealth: Payer: Self-pay | Admitting: Family Medicine

## 2021-11-20 ENCOUNTER — Other Ambulatory Visit: Payer: Self-pay | Admitting: Family Medicine

## 2021-11-20 DIAGNOSIS — E1165 Type 2 diabetes mellitus with hyperglycemia: Secondary | ICD-10-CM

## 2021-11-20 NOTE — Telephone Encounter (Signed)
I spoke with patient daughter to schedule AWV. Scheduled 12/24/21.  She wanted to know if patient could have labs for A1C at this appt.

## 2021-11-20 NOTE — Telephone Encounter (Signed)
Left message for patient to call back and schedule Medicare Annual Wellness Visit (AWV) either virtually or in office.   **AWVS DUE 10/16/2017 PER [place] please schedule at anytime with health coach

## 2021-11-25 ENCOUNTER — Emergency Department (HOSPITAL_BASED_OUTPATIENT_CLINIC_OR_DEPARTMENT_OTHER): Payer: Medicare Other

## 2021-11-25 ENCOUNTER — Other Ambulatory Visit: Payer: Self-pay

## 2021-11-25 ENCOUNTER — Inpatient Hospital Stay (HOSPITAL_BASED_OUTPATIENT_CLINIC_OR_DEPARTMENT_OTHER)
Admission: EM | Admit: 2021-11-25 | Discharge: 2021-11-29 | DRG: 872 | Disposition: A | Discharge status: Skilled Nursing Facility | Payer: Medicare Other | Attending: Internal Medicine | Admitting: Internal Medicine

## 2021-11-25 ENCOUNTER — Encounter (HOSPITAL_BASED_OUTPATIENT_CLINIC_OR_DEPARTMENT_OTHER): Payer: Self-pay

## 2021-11-25 DIAGNOSIS — Z79891 Long term (current) use of opiate analgesic: Secondary | ICD-10-CM

## 2021-11-25 DIAGNOSIS — Z90710 Acquired absence of both cervix and uterus: Secondary | ICD-10-CM

## 2021-11-25 DIAGNOSIS — Z8542 Personal history of malignant neoplasm of other parts of uterus: Secondary | ICD-10-CM

## 2021-11-25 DIAGNOSIS — E785 Hyperlipidemia, unspecified: Secondary | ICD-10-CM | POA: Diagnosis present

## 2021-11-25 DIAGNOSIS — Z9221 Personal history of antineoplastic chemotherapy: Secondary | ICD-10-CM

## 2021-11-25 DIAGNOSIS — Z9049 Acquired absence of other specified parts of digestive tract: Secondary | ICD-10-CM

## 2021-11-25 DIAGNOSIS — R32 Unspecified urinary incontinence: Secondary | ICD-10-CM | POA: Diagnosis present

## 2021-11-25 DIAGNOSIS — E876 Hypokalemia: Secondary | ICD-10-CM | POA: Diagnosis present

## 2021-11-25 DIAGNOSIS — Z833 Family history of diabetes mellitus: Secondary | ICD-10-CM

## 2021-11-25 DIAGNOSIS — E1165 Type 2 diabetes mellitus with hyperglycemia: Secondary | ICD-10-CM | POA: Diagnosis present

## 2021-11-25 DIAGNOSIS — A419 Sepsis, unspecified organism: Principal | ICD-10-CM | POA: Diagnosis present

## 2021-11-25 DIAGNOSIS — Z8249 Family history of ischemic heart disease and other diseases of the circulatory system: Secondary | ICD-10-CM

## 2021-11-25 DIAGNOSIS — K8030 Calculus of bile duct with cholangitis, unspecified, without obstruction: Secondary | ICD-10-CM | POA: Diagnosis present

## 2021-11-25 DIAGNOSIS — K8050 Calculus of bile duct without cholangitis or cholecystitis without obstruction: Secondary | ICD-10-CM | POA: Diagnosis present

## 2021-11-25 DIAGNOSIS — Z66 Do not resuscitate: Secondary | ICD-10-CM | POA: Diagnosis present

## 2021-11-25 DIAGNOSIS — D689 Coagulation defect, unspecified: Secondary | ICD-10-CM | POA: Diagnosis present

## 2021-11-25 DIAGNOSIS — Z79899 Other long term (current) drug therapy: Secondary | ICD-10-CM

## 2021-11-25 DIAGNOSIS — G4733 Obstructive sleep apnea (adult) (pediatric): Secondary | ICD-10-CM | POA: Diagnosis present

## 2021-11-25 DIAGNOSIS — Z20822 Contact with and (suspected) exposure to covid-19: Secondary | ICD-10-CM | POA: Diagnosis present

## 2021-11-25 DIAGNOSIS — Z883 Allergy status to other anti-infective agents status: Secondary | ICD-10-CM

## 2021-11-25 DIAGNOSIS — Z923 Personal history of irradiation: Secondary | ICD-10-CM

## 2021-11-25 DIAGNOSIS — I10 Essential (primary) hypertension: Secondary | ICD-10-CM | POA: Diagnosis present

## 2021-11-25 DIAGNOSIS — Z7901 Long term (current) use of anticoagulants: Secondary | ICD-10-CM

## 2021-11-25 DIAGNOSIS — I739 Peripheral vascular disease, unspecified: Secondary | ICD-10-CM | POA: Diagnosis present

## 2021-11-25 DIAGNOSIS — Z96642 Presence of left artificial hip joint: Secondary | ICD-10-CM | POA: Diagnosis present

## 2021-11-25 DIAGNOSIS — Z86711 Personal history of pulmonary embolism: Secondary | ICD-10-CM

## 2021-11-25 DIAGNOSIS — R1011 Right upper quadrant pain: Secondary | ICD-10-CM

## 2021-11-25 DIAGNOSIS — G894 Chronic pain syndrome: Secondary | ICD-10-CM | POA: Diagnosis present

## 2021-11-25 DIAGNOSIS — Z7984 Long term (current) use of oral hypoglycemic drugs: Secondary | ICD-10-CM

## 2021-11-25 DIAGNOSIS — K760 Fatty (change of) liver, not elsewhere classified: Secondary | ICD-10-CM | POA: Diagnosis present

## 2021-11-25 DIAGNOSIS — F39 Unspecified mood [affective] disorder: Secondary | ICD-10-CM | POA: Diagnosis present

## 2021-11-25 DIAGNOSIS — Z87891 Personal history of nicotine dependence: Secondary | ICD-10-CM

## 2021-11-25 DIAGNOSIS — K5909 Other constipation: Secondary | ICD-10-CM | POA: Diagnosis present

## 2021-11-25 DIAGNOSIS — Z9682 Presence of neurostimulator: Secondary | ICD-10-CM

## 2021-11-25 DIAGNOSIS — K8689 Other specified diseases of pancreas: Secondary | ICD-10-CM | POA: Diagnosis present

## 2021-11-25 DIAGNOSIS — I2609 Other pulmonary embolism with acute cor pulmonale: Secondary | ICD-10-CM | POA: Diagnosis present

## 2021-11-25 DIAGNOSIS — I2699 Other pulmonary embolism without acute cor pulmonale: Secondary | ICD-10-CM | POA: Diagnosis present

## 2021-11-25 DIAGNOSIS — H409 Unspecified glaucoma: Secondary | ICD-10-CM | POA: Diagnosis present

## 2021-11-25 DIAGNOSIS — K219 Gastro-esophageal reflux disease without esophagitis: Secondary | ICD-10-CM | POA: Diagnosis present

## 2021-11-25 DIAGNOSIS — Z96651 Presence of right artificial knee joint: Secondary | ICD-10-CM | POA: Diagnosis present

## 2021-11-25 DIAGNOSIS — Z8501 Personal history of malignant neoplasm of esophagus: Secondary | ICD-10-CM

## 2021-11-25 DIAGNOSIS — D649 Anemia, unspecified: Secondary | ICD-10-CM | POA: Diagnosis present

## 2021-11-25 DIAGNOSIS — I248 Other forms of acute ischemic heart disease: Secondary | ICD-10-CM | POA: Diagnosis present

## 2021-11-25 DIAGNOSIS — R0602 Shortness of breath: Secondary | ICD-10-CM

## 2021-11-25 DIAGNOSIS — R918 Other nonspecific abnormal finding of lung field: Secondary | ICD-10-CM | POA: Diagnosis present

## 2021-11-25 DIAGNOSIS — Z801 Family history of malignant neoplasm of trachea, bronchus and lung: Secondary | ICD-10-CM

## 2021-11-25 DIAGNOSIS — D696 Thrombocytopenia, unspecified: Secondary | ICD-10-CM | POA: Diagnosis present

## 2021-11-25 DIAGNOSIS — R6520 Severe sepsis without septic shock: Secondary | ICD-10-CM | POA: Diagnosis present

## 2021-11-25 DIAGNOSIS — K8020 Calculus of gallbladder without cholecystitis without obstruction: Secondary | ICD-10-CM

## 2021-11-25 DIAGNOSIS — C154 Malignant neoplasm of middle third of esophagus: Secondary | ICD-10-CM | POA: Diagnosis present

## 2021-11-25 DIAGNOSIS — Z882 Allergy status to sulfonamides status: Secondary | ICD-10-CM

## 2021-11-25 HISTORY — DX: Calculus of bile duct without cholangitis or cholecystitis without obstruction: K80.50

## 2021-11-25 LAB — CBC WITH DIFFERENTIAL/PLATELET
Abs Immature Granulocytes: 0.04 10*3/uL (ref 0.00–0.07)
Basophils Absolute: 0 10*3/uL (ref 0.0–0.1)
Basophils Relative: 0 %
Eosinophils Absolute: 0 10*3/uL (ref 0.0–0.5)
Eosinophils Relative: 0 %
HCT: 36.4 % (ref 36.0–46.0)
Hemoglobin: 12.2 g/dL (ref 12.0–15.0)
Immature Granulocytes: 0 %
Lymphocytes Relative: 7 %
Lymphs Abs: 0.6 10*3/uL — ABNORMAL LOW (ref 0.7–4.0)
MCH: 30 pg (ref 26.0–34.0)
MCHC: 33.5 g/dL (ref 30.0–36.0)
MCV: 89.7 fL (ref 80.0–100.0)
Monocytes Absolute: 0.3 10*3/uL (ref 0.1–1.0)
Monocytes Relative: 3 %
Neutro Abs: 8.6 10*3/uL — ABNORMAL HIGH (ref 1.7–7.7)
Neutrophils Relative %: 90 %
Platelets: 197 10*3/uL (ref 150–400)
RBC: 4.06 MIL/uL (ref 3.87–5.11)
RDW: 12.8 % (ref 11.5–15.5)
WBC: 9.6 10*3/uL (ref 4.0–10.5)
nRBC: 0 % (ref 0.0–0.2)

## 2021-11-25 LAB — RESP PANEL BY RT-PCR (FLU A&B, COVID) ARPGX2
Influenza A by PCR: NEGATIVE
Influenza B by PCR: NEGATIVE
SARS Coronavirus 2 by RT PCR: NEGATIVE

## 2021-11-25 LAB — LACTIC ACID, PLASMA
Lactic Acid, Venous: 2 mmol/L (ref 0.5–1.9)
Lactic Acid, Venous: 2.7 mmol/L (ref 0.5–1.9)

## 2021-11-25 LAB — URINALYSIS, ROUTINE W REFLEX MICROSCOPIC
Bilirubin Urine: NEGATIVE
Glucose, UA: >=500 mg/dL — AB
Ketones, ur: NEGATIVE mg/dL
Nitrite: NEGATIVE
Protein, ur: 30 mg/dL — AB
Specific Gravity, Urine: 1.015 (ref 1.005–1.030)
pH: 5 (ref 5.0–8.0)

## 2021-11-25 LAB — COMPREHENSIVE METABOLIC PANEL
ALT: 236 U/L — ABNORMAL HIGH (ref 0–44)
AST: 501 U/L — ABNORMAL HIGH (ref 15–41)
Albumin: 3.7 g/dL (ref 3.5–5.0)
Alkaline Phosphatase: 158 U/L — ABNORMAL HIGH (ref 38–126)
Anion gap: 8 (ref 5–15)
BUN: 19 mg/dL (ref 8–23)
CO2: 24 mmol/L (ref 22–32)
Calcium: 9.2 mg/dL (ref 8.9–10.3)
Chloride: 104 mmol/L (ref 98–111)
Creatinine, Ser: 0.85 mg/dL (ref 0.44–1.00)
GFR, Estimated: >60 mL/min (ref >60)
Glucose, Bld: 273 mg/dL — ABNORMAL HIGH (ref 70–99)
Potassium: 4 mmol/L (ref 3.5–5.1)
Sodium: 136 mmol/L (ref 135–145)
Total Bilirubin: 1.7 mg/dL — ABNORMAL HIGH (ref 0.3–1.2)
Total Protein: 6.8 g/dL (ref 6.5–8.1)

## 2021-11-25 LAB — URINALYSIS, MICROSCOPIC (REFLEX)

## 2021-11-25 LAB — CBG MONITORING, ED: Glucose-Capillary: 302 mg/dL — ABNORMAL HIGH (ref 70–99)

## 2021-11-25 LAB — TROPONIN I (HIGH SENSITIVITY)
Troponin I (High Sensitivity): 11 ng/L (ref <18)
Troponin I (High Sensitivity): 18 ng/L — ABNORMAL HIGH (ref <18)

## 2021-11-25 LAB — LIPASE, BLOOD: Lipase: 20 U/L (ref 11–51)

## 2021-11-25 MED ORDER — IOHEXOL 300 MG/ML  SOLN
100.0000 mL | Freq: Once | INTRAMUSCULAR | Status: AC | PRN
  Administered 2021-11-25: 100 mL via INTRAVENOUS

## 2021-11-25 MED ORDER — LABETALOL HCL 5 MG/ML IV SOLN
10.0000 mg | Freq: Once | INTRAVENOUS | Status: AC
  Administered 2021-11-25: 10 mg via INTRAVENOUS
  Filled 2021-11-25: qty 4

## 2021-11-25 MED ORDER — MORPHINE SULFATE (PF) 4 MG/ML IV SOLN
4.0000 mg | Freq: Once | INTRAVENOUS | Status: AC
  Administered 2021-11-25: 4 mg via INTRAVENOUS
  Filled 2021-11-25: qty 1

## 2021-11-25 MED ORDER — LABETALOL HCL 5 MG/ML IV SOLN
5.0000 mg | Freq: Once | INTRAVENOUS | Status: AC
  Administered 2021-11-25: 5 mg via INTRAVENOUS
  Filled 2021-11-25: qty 4

## 2021-11-25 MED ORDER — PIPERACILLIN-TAZOBACTAM 3.375 G IVPB 30 MIN
3.3750 g | Freq: Once | INTRAVENOUS | Status: AC
  Administered 2021-11-25: 3.375 g via INTRAVENOUS
  Filled 2021-11-25: qty 50

## 2021-11-25 MED ORDER — SODIUM CHLORIDE 0.9 % IV BOLUS
500.0000 mL | Freq: Once | INTRAVENOUS | Status: AC
  Administered 2021-11-25: 500 mL via INTRAVENOUS

## 2021-11-25 MED ORDER — MORPHINE SULFATE (PF) 4 MG/ML IV SOLN
4.0000 mg | INTRAVENOUS | Status: DC | PRN
  Administered 2021-11-25 – 2021-11-26 (×3): 4 mg via INTRAVENOUS
  Filled 2021-11-25 (×3): qty 1

## 2021-11-25 MED ORDER — SODIUM CHLORIDE 0.9 % IV BOLUS
1000.0000 mL | Freq: Once | INTRAVENOUS | Status: AC
  Administered 2021-11-25: 1000 mL via INTRAVENOUS

## 2021-11-25 NOTE — ED Triage Notes (Signed)
Pt reports abdominal pain and bloating x 1 week.  Denies n/v/d.  LBM today, but hard stools, reports hx intermittent constipation

## 2021-11-25 NOTE — ED Provider Notes (Signed)
Emergency Department Provider Note   I have reviewed the triage vital signs and the nursing notes.   HISTORY  Chief Complaint Abdominal Pain   HPI Sherry Holloway is a 87 y.o. female with past medical history reviewed below presents to the emergency department for evaluation of abdominal pain starting today.  She is having moderate to severe pain worse in the right upper quadrant.  No significant radiation to the chest but it is roughly in that location as well.  She is noticed some abdominal bloating over the past several days but pain began today.  She has some mild nausea but no vomiting.  She does have daily bowel movements but her daughter, at bedside, notes that she does take chronic morphine and constipation is an issue for her.  She continues to have daily bowel movements although they have been smaller in volume and more hard in consistency. No fever.   Past Medical History:  Diagnosis Date   Arthritis    Chicken pox    Diabetes mellitus without complication ([place])    Diverticulitis    Esophageal cancer ([place]) dx'd 2020   Esophagus cancer ([facility])    GERD (gastroesophageal reflux disease)    Hyperlipidemia    Hypertension    OSA (obstructive sleep apnea)    Urine incontinence    Uterine cancer [facility])     Patient Active Problem List   Diagnosis Date Noted   Choledocholithiasis 11/25/2021   Personal history of esophageal cancer 08/26/2021   Weight loss, unintentional 08/26/2021   Type 2 diabetes mellitus with diabetic polyneuropathy, without Curlee Bogan-term current use of insulin ([place]) 06/12/2021   Snoring 04/30/2021   At risk for central sleep apnea 04/30/2021   Acute pulmonary embolism ([place]) 03/29/2021   Cancer of middle third of esophagus ([place]) 03/19/2021   Pulmonary embolus ([place]) 03/16/2021   Subclavian arterial stenosis ([place]) 02/13/2021   Type 2 diabetes mellitus with hyperglycemia, without Latroy Gaymon-term current use of insulin ([place]) 02/06/2021   Acquired trigger finger of  left middle finger 01/09/2021   Acquired trigger finger of left ring finger 01/09/2021   Trigger finger of left hand 12/24/2020   Plantar wart 12/24/2020   Pancreatic mass 10/19/2020   Gastroesophageal reflux disease with esophagitis without hemorrhage 10/19/2020   Hypertension 10/19/2020   Diabetes mellitus without complication ([place]) 59/16/3846   Vaginal odor 10/19/2020   Hyperlipidemia 10/19/2020   OSA (obstructive sleep apnea) 10/19/2020   Chronic bilateral back pain 10/19/2020    Past Surgical History:  Procedure Laterality Date   ABDOMINAL HYSTERECTOMY  1972   APPENDECTOMY     BILIARY DILATION  03/05/2021   Procedure: BILIARY DILATION;  Surgeon: Irving Copas., MD;  Location: [facility];  Service: Gastroenterology;;   BIOPSY  03/05/2021   Procedure: BIOPSY;  Surgeon: Irving Copas., MD;  Location: [facility];  Service: Gastroenterology;;   CHOLECYSTECTOMY     ENDOSCOPIC RETROGRADE CHOLANGIOPANCREATOGRAPHY (ERCP) WITH PROPOFOL N/A 03/05/2021   Procedure: ENDOSCOPIC RETROGRADE CHOLANGIOPANCREATOGRAPHY (ERCP) WITH PROPOFOL;  Surgeon: Irving Copas., MD;  Location: [facility];  Service: Gastroenterology;  Laterality: N/A;   ESOPHAGOGASTRODUODENOSCOPY (EGD) WITH PROPOFOL N/A 03/05/2021   Procedure: ESOPHAGOGASTRODUODENOSCOPY (EGD) WITH PROPOFOL;  Surgeon: Rush Landmark Telford Nab., MD;  Location: [facility];  Service: Gastroenterology;  Laterality: N/A;   EUS N/A 03/05/2021   Procedure: UPPER ENDOSCOPIC ULTRASOUND (EUS) RADIAL;  Surgeon: Irving Copas., MD;  Location: [facility];  Service: Gastroenterology;  Laterality: N/A;   LUMBAR LAMINECTOMY     REMOVAL OF STONES  03/05/2021   Procedure: REMOVAL OF STONES;  Surgeon: Rush Landmark Telford Nab., MD;  Location: [facility];  Service: Gastroenterology;;   SPINAL CORD STIMULATOR IMPLANT     SPYGLASS CHOLANGIOSCOPY N/A 03/05/2021   Procedure: DDUKGURK CHOLANGIOSCOPY;  Surgeon: Irving Copas., MD;  Location: [facility];  Service: Gastroenterology;  Laterality: N/A;   SPYGLASS LITHOTRIPSY N/A 03/05/2021   Procedure: YHCWCBJS LITHOTRIPSY;  Surgeon: Irving Copas., MD;  Location: [facility];  Service: Gastroenterology;  Laterality: N/A;   TONSILLECTOMY     TOTAL KNEE ARTHROPLASTY Right     Allergies Gramicidin, Sulfa antibiotics, and Wound dressing adhesive  Family History  Problem Relation Age of Onset   Diabetes Mother    Heart disease Father    Lung cancer Sister    Lung cancer Son    Hypertension Son    Hypertension Daughter    Diabetes Son    Hypertension Son    Diabetes Daughter    Hypertension Daughter     Social History Social History   Tobacco Use   Smoking status: Former    Types: Cigarettes    Quit date: 01/17/1989    Years since quitting: 32.8   Smokeless tobacco: Never  Vaping Use   Vaping Use: Never used  Substance Use Topics   Alcohol use: Not Currently   Drug use: Never    Review of Systems  Constitutional: No fever/chills Eyes: No visual changes. ENT: No sore throat. Cardiovascular: Denies chest pain. Respiratory: Denies shortness of breath. Gastrointestinal: Positive RUQ abdominal pain. Mild nausea, no vomiting.  No diarrhea.  Positive chronic constipation. Genitourinary: Negative for dysuria. Musculoskeletal: Negative for back pain. Skin: Negative for rash. Neurological: Negative for headaches, focal weakness or numbness.  10-point ROS otherwise negative.  ____________________________________________   PHYSICAL EXAM:  VITAL SIGNS: ED Triage Vitals  Enc Vitals Group     BP 11/25/21 1724 (!) 202/68     Pulse Rate 11/25/21 1724 73     Resp 11/25/21 1724 18     Temp 11/25/21 1724 98.2 F (36.8 C)     Temp Source 11/25/21 1724 Oral     SpO2 11/25/21 1724 99 %     Weight 11/25/21 1721 164 lb (74.4 kg)     Height 11/25/21 1721 5\' 4"  (1.626 m)   Constitutional: Alert and oriented. Well appearing and in no acute  distress. Eyes: Conjunctivae are normal. Head: Atraumatic. Nose: No congestion/rhinnorhea. Mouth/Throat: Mucous membranes are moist.   Neck: No stridor.  Cardiovascular: Normal rate, regular rhythm. Good peripheral circulation. Grossly normal heart sounds.   Respiratory: Normal respiratory effort.  No retractions. Lungs CTAB. Gastrointestinal: Soft with tenderness in the RUQ. No significant tenderness in other regions. No distention.  Musculoskeletal: No lower extremity tenderness nor edema. No gross deformities of extremities. Neurologic:  Normal speech and language. No gross focal neurologic deficits are appreciated.  Skin:  Skin is warm, dry and intact. No rash noted.  ____________________________________________   LABS (all labs ordered are listed, but only abnormal results are displayed)  Labs Reviewed  COMPREHENSIVE METABOLIC PANEL - Abnormal; Notable for the following components:      Result Value   Glucose, Bld 273 (*)    AST 501 (*)    ALT 236 (*)    Alkaline Phosphatase 158 (*)    Total Bilirubin 1.7 (*)    All other components within normal limits  CBC WITH DIFFERENTIAL/PLATELET - Abnormal; Notable for the following components:   Neutro Abs 8.6 (*)  Lymphs Abs 0.6 (*)    All other components within normal limits  LACTIC ACID, PLASMA - Abnormal; Notable for the following components:   Lactic Acid, Venous 2.0 (*)    All other components within normal limits  LACTIC ACID, PLASMA - Abnormal; Notable for the following components:   Lactic Acid, Venous 2.7 (*)    All other components within normal limits  CBG MONITORING, ED - Abnormal; Notable for the following components:   Glucose-Capillary 302 (*)    All other components within normal limits  TROPONIN I (HIGH SENSITIVITY) - Abnormal; Notable for the following components:   Troponin I (High Sensitivity) 18 (*)    All other components within normal limits  RESP PANEL BY RT-PCR (FLU A&B, COVID) ARPGX2  LIPASE, BLOOD   URINALYSIS, ROUTINE W REFLEX MICROSCOPIC  TROPONIN I (HIGH SENSITIVITY)   ____________________________________________  EKG   EKG Interpretation  Date/Time:  Sunday November 25 2021 18:12:59 EST Ventricular Rate:  75 PR Interval:  186 QRS Duration: 144 QT Interval:  410 QTC Calculation: 458 R Axis:   -47 Text Interpretation: Sinus rhythm RBBB and LAFB LVH with secondary repolarization abnormality Anterior Q waves, possibly due to LVH Confirmed by Nanda Quinton [phone]) on 11/25/2021 6:59:55 PM        ____________________________________________  RADIOLOGY  CT ABDOMEN PELVIS W CONTRAST  Result Date: 11/25/2021 CLINICAL DATA:  Abdominal pain, bloating x1 week, constipation EXAM: CT ABDOMEN AND PELVIS WITH CONTRAST TECHNIQUE: Multidetector CT imaging of the abdomen and pelvis was performed using the standard protocol following bolus administration of intravenous contrast. CONTRAST:  175mL OMNIPAQUE IOHEXOL 300 MG/ML  SOLN COMPARISON:  PET-CT dated 04/02/2021. CT pelvis dated 03/15/2021. CT abdomen dated 01/25/2021. FINDINGS: Lower chest: Scarring in the lung bases. Hepatobiliary: Liver is within normal limits. Moderate intrahepatic and extrahepatic ductal dilatation. Common duct measures 19 mm, similar to the prior. Associated soft tissue in the distal CBD, measuring up to 21 mm (series 5/image 70), likely reflecting choledocholithiasis. Pancreas: Dilated main pancreatic duct measuring up to 13 mm at the ampulla (series 2/image 42). 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 Mild diffuse parenchymal atrophy. Spleen: Within normal limits. Adrenals/Urinary Tract: Adrenal glands are within normal limits. Kidneys are within normal limits.  No hydronephrosis. Bladder is within normal limits. Stomach/Bowel: Stomach is notable for a  tiny hiatal hernia. Duodenal diverticulum (series 2/image 40). No evidence of bowel obstruction. Appendix is not discretely visualized. Sigmoid diverticulosis, without evidence of diverticulitis. Vascular/Lymphatic: No evidence of abdominal aortic aneurysm. Atherosclerotic calcifications of the abdominal aorta and branch vessels. No suspicious abdominopelvic lymphadenopathy. Reproductive: Status post hysterectomy. No adnexal masses. Other: No abdominopelvic ascites. Musculoskeletal: Degenerative changes of the visualized thoracolumbar spine. Left hip arthroplasty, without evidence of complication. Associated streak artifact. 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 Associated pancreatic ductal dilatation and mild diffuse parenchymal atrophy. Moderate intrahepatic and extrahepatic ductal dilatation with suspected choledocholithiasis in the distal common duct measuring up to 2.1 cm. No evidence of bowel obstruction. Sigmoid diverticulosis, without evidence of diverticulitis. Electronically Signed   By: Julian Hy M.D.   On: 11/25/2021 20:07   DG Chest Portable 1 View  Result Date: 11/25/2021 CLINICAL DATA:  Hypoxemia. EXAM: PORTABLE CHEST 1 VIEW COMPARISON:  Chest CT dated 03/15/2021. FINDINGS: There is diffuse interstitial prominence with Kerley B-lines  consistent with edema. Minimal left lung base atelectasis. No consolidative changes. There is no pleural effusion pneumothorax. The cardiac silhouette is within limits. Atherosclerotic calcification of the aorta. Degenerative changes of the spine. No acute osseous pathology. Thoracic spine stimulator. IMPRESSION: Interstitial pulmonary edema. Electronically Signed   By: Anner Crete M.D.   On: 11/25/2021 20:52   US Abdomen Limited RUQ (LIVER/GB)  Result Date: 11/25/2021 CLINICAL DATA:  Abdominal pain. EXAM: ULTRASOUND ABDOMEN LIMITED RIGHT UPPER QUADRANT COMPARISON:   None. FINDINGS: Gallbladder: Cholecystectomy. Common bile duct: Diameter: 13 mm. The common bile duct is dilated, likely post cholecystectomy. Liver: There is diffuse increased liver echogenicity most commonly seen in the setting of fatty infiltration. Superimposed inflammation or fibrosis is not excluded. Clinical correlation is recommended. Portal vein is patent on color Doppler imaging with normal direction of blood flow towards the liver. Other: None. IMPRESSION: 1. Fatty liver. 2. Cholecystectomy. Electronically Signed   By: Anner Crete M.D.   On: 11/25/2021 20:55    ____________________________________________   PROCEDURES  Procedure(s) performed:   Procedures  CRITICAL CARE Performed by: Margette Fast Total critical care time: 35 minutes Critical care time was exclusive of separately billable procedures and treating other patients. Critical care was necessary to treat or prevent imminent or life-threatening deterioration. Critical care was time spent personally by me on the following activities: development of treatment plan with patient and/or surrogate as well as nursing, discussions with consultants, evaluation of patient's response to treatment, examination of patient, obtaining history from patient or surrogate, ordering and performing treatments and interventions, ordering and review of laboratory studies, ordering and review of radiographic studies, pulse oximetry and re-evaluation of patient's condition.  Nanda Quinton, MD Emergency Medicine  ____________________________________________   INITIAL IMPRESSION / ASSESSMENT AND PLAN / ED COURSE  Pertinent labs & imaging results that were available during my care of the patient were reviewed by me and considered in my medical decision making (see chart for details).   Patient presents emergency room for evaluation of abdominal pain.  She has upper quadrant abdominal pain in the right.  No peritonitis.  Some distention.    Differential diagnosis includes but is not exclusive to acute cholecystitis, intrathoracic causes for epigastric abdominal pain, gastritis, duodenitis, pancreatitis, small bowel or large bowel obstruction, abdominal aortic aneurysm, hernia, gastritis, etc.  CT imaging reviewed and discussed with Dr. Rush Landmark with [facility].  He is familiar with this patient.  He advises starting antibiotics and pain control with fluids.  [facility] our GI will consult and arrange for ERCP for further evaluation.   Patient's blood pressure improving slightly with labetalol along with better pain control.  COVID and flu are negative.  Mild lactic acidosis but no leukocytosis.  Patient notes that her pain is improving overall and reevaluation.  She remains awake and alert.   Discussed patient's case with [facility] to request admission. Patient and family (if present) updated with plan. Care transferred to [facility] [address] service.  I reviewed all nursing notes, vitals, pertinent old records, EKGs, labs, imaging (as available).  ____________________________________________  FINAL CLINICAL IMPRESSION(S) / ED DIAGNOSES  Final diagnoses:  RUQ abdominal pain  Choledocholithiasis     MEDICATIONS GIVEN DURING THIS VISIT:  Medications  morphine 4 MG/ML injection 4 mg (4 mg Intravenous Given 11/25/21 2226)  sodium chloride 0.9 % bolus 500 mL (0 mLs Intravenous Stopped 11/25/21 2001)  morphine 4 MG/ML injection 4 mg (4 mg Intravenous Given 11/25/21 1820)  iohexol (OMNIPAQUE) 300 MG/ML solution 100 mL (100 mLs  Intravenous Contrast Given 11/25/21 1927)  labetalol (NORMODYNE) injection 5 mg (5 mg Intravenous Given 11/25/21 2017)  piperacillin-tazobactam (ZOSYN) IVPB 3.375 g (0 g Intravenous Stopped 11/25/21 2127)  sodium chloride 0.9 % bolus 1,000 mL (1,000 mLs Intravenous New Bag/Given 11/25/21 2101)  labetalol (NORMODYNE) injection 10 mg (10 mg Intravenous Given 11/25/21 2100)    Note:  This document was prepared using Dragon voice  recognition software and may include unintentional dictation errors.  Nanda Quinton, MD, [facility] Emergency Medicine    Baran Kuhrt, [facility], MD 11/25/21 [phone]

## 2021-11-25 NOTE — ED Notes (Signed)
Patient desatting to the 39s. RT at bedside. Pt placed on 2.5L O2. Pt sat now 95%.

## 2021-11-26 ENCOUNTER — Encounter: Payer: Self-pay | Admitting: Family Medicine

## 2021-11-26 ENCOUNTER — Encounter (HOSPITAL_COMMUNITY): Payer: Self-pay | Admitting: Internal Medicine

## 2021-11-26 DIAGNOSIS — K5909 Other constipation: Secondary | ICD-10-CM | POA: Diagnosis present

## 2021-11-26 DIAGNOSIS — K8030 Calculus of bile duct with cholangitis, unspecified, without obstruction: Secondary | ICD-10-CM | POA: Diagnosis present

## 2021-11-26 DIAGNOSIS — R6520 Severe sepsis without septic shock: Secondary | ICD-10-CM | POA: Diagnosis present

## 2021-11-26 DIAGNOSIS — G4733 Obstructive sleep apnea (adult) (pediatric): Secondary | ICD-10-CM | POA: Diagnosis present

## 2021-11-26 DIAGNOSIS — E785 Hyperlipidemia, unspecified: Secondary | ICD-10-CM | POA: Diagnosis present

## 2021-11-26 DIAGNOSIS — K760 Fatty (change of) liver, not elsewhere classified: Secondary | ICD-10-CM | POA: Diagnosis present

## 2021-11-26 DIAGNOSIS — A419 Sepsis, unspecified organism: Secondary | ICD-10-CM | POA: Diagnosis present

## 2021-11-26 DIAGNOSIS — D689 Coagulation defect, unspecified: Secondary | ICD-10-CM | POA: Diagnosis present

## 2021-11-26 DIAGNOSIS — D696 Thrombocytopenia, unspecified: Secondary | ICD-10-CM | POA: Diagnosis present

## 2021-11-26 DIAGNOSIS — G894 Chronic pain syndrome: Secondary | ICD-10-CM | POA: Diagnosis present

## 2021-11-26 DIAGNOSIS — D649 Anemia, unspecified: Secondary | ICD-10-CM | POA: Diagnosis present

## 2021-11-26 DIAGNOSIS — E876 Hypokalemia: Secondary | ICD-10-CM | POA: Diagnosis present

## 2021-11-26 DIAGNOSIS — K8309 Other cholangitis: Secondary | ICD-10-CM | POA: Diagnosis not present

## 2021-11-26 DIAGNOSIS — H409 Unspecified glaucoma: Secondary | ICD-10-CM | POA: Diagnosis present

## 2021-11-26 DIAGNOSIS — I248 Other forms of acute ischemic heart disease: Secondary | ICD-10-CM | POA: Diagnosis present

## 2021-11-26 DIAGNOSIS — R918 Other nonspecific abnormal finding of lung field: Secondary | ICD-10-CM | POA: Diagnosis present

## 2021-11-26 DIAGNOSIS — Z20822 Contact with and (suspected) exposure to covid-19: Secondary | ICD-10-CM | POA: Diagnosis present

## 2021-11-26 DIAGNOSIS — I739 Peripheral vascular disease, unspecified: Secondary | ICD-10-CM | POA: Diagnosis present

## 2021-11-26 DIAGNOSIS — E1165 Type 2 diabetes mellitus with hyperglycemia: Secondary | ICD-10-CM | POA: Diagnosis present

## 2021-11-26 DIAGNOSIS — K8050 Calculus of bile duct without cholangitis or cholecystitis without obstruction: Secondary | ICD-10-CM | POA: Diagnosis present

## 2021-11-26 DIAGNOSIS — E872 Acidosis, unspecified: Secondary | ICD-10-CM | POA: Diagnosis not present

## 2021-11-26 DIAGNOSIS — R32 Unspecified urinary incontinence: Secondary | ICD-10-CM | POA: Diagnosis present

## 2021-11-26 DIAGNOSIS — Z66 Do not resuscitate: Secondary | ICD-10-CM | POA: Diagnosis present

## 2021-11-26 DIAGNOSIS — I10 Essential (primary) hypertension: Secondary | ICD-10-CM | POA: Diagnosis present

## 2021-11-26 DIAGNOSIS — F39 Unspecified mood [affective] disorder: Secondary | ICD-10-CM | POA: Diagnosis present

## 2021-11-26 DIAGNOSIS — K219 Gastro-esophageal reflux disease without esophagitis: Secondary | ICD-10-CM | POA: Diagnosis present

## 2021-11-26 LAB — GLUCOSE, CAPILLARY
Glucose-Capillary: 208 mg/dL — ABNORMAL HIGH (ref 70–99)
Glucose-Capillary: 227 mg/dL — ABNORMAL HIGH (ref 70–99)

## 2021-11-26 LAB — COMPREHENSIVE METABOLIC PANEL
ALT: 471 U/L — ABNORMAL HIGH (ref 0–44)
AST: 529 U/L — ABNORMAL HIGH (ref 15–41)
Albumin: 2.9 g/dL — ABNORMAL LOW (ref 3.5–5.0)
Alkaline Phosphatase: 133 U/L — ABNORMAL HIGH (ref 38–126)
Anion gap: 10 (ref 5–15)
BUN: 17 mg/dL (ref 8–23)
CO2: 19 mmol/L — ABNORMAL LOW (ref 22–32)
Calcium: 8.2 mg/dL — ABNORMAL LOW (ref 8.9–10.3)
Chloride: 107 mmol/L (ref 98–111)
Creatinine, Ser: 0.79 mg/dL (ref 0.44–1.00)
GFR, Estimated: >60 mL/min (ref >60)
Glucose, Bld: 289 mg/dL — ABNORMAL HIGH (ref 70–99)
Potassium: 3.5 mmol/L (ref 3.5–5.1)
Sodium: 136 mmol/L (ref 135–145)
Total Bilirubin: 3.2 mg/dL — ABNORMAL HIGH (ref 0.3–1.2)
Total Protein: 5.5 g/dL — ABNORMAL LOW (ref 6.5–8.1)

## 2021-11-26 LAB — LIPASE, BLOOD: Lipase: 21 U/L (ref 11–51)

## 2021-11-26 LAB — LACTIC ACID, PLASMA
Lactic Acid, Venous: 3.4 mmol/L (ref 0.5–1.9)
Lactic Acid, Venous: 2.6 mmol/L (ref 0.5–1.9)

## 2021-11-26 LAB — TROPONIN I (HIGH SENSITIVITY)
Troponin I (High Sensitivity): 43 ng/L — ABNORMAL HIGH (ref <18)
Troponin I (High Sensitivity): 66 ng/L — ABNORMAL HIGH (ref <18)
Troponin I (High Sensitivity): 56 ng/L — ABNORMAL HIGH (ref <18)

## 2021-11-26 LAB — PROTIME-INR
INR: 1.3 — ABNORMAL HIGH (ref 0.8–1.2)
Prothrombin Time: 15.9 seconds — ABNORMAL HIGH (ref 11.4–15.2)

## 2021-11-26 MED ORDER — INSULIN ASPART 100 UNIT/ML IJ SOLN
0.0000 [IU] | INTRAMUSCULAR | Status: DC
  Administered 2021-11-27: 2 [IU] via SUBCUTANEOUS
  Administered 2021-11-27: 23:00:00 3 [IU] via SUBCUTANEOUS
  Administered 2021-11-27: 5 [IU] via SUBCUTANEOUS
  Administered 2021-11-27 – 2021-11-28 (×4): 3 [IU] via SUBCUTANEOUS
  Administered 2021-11-28: 23:00:00 5 [IU] via SUBCUTANEOUS
  Administered 2021-11-28: 16:00:00 3 [IU] via SUBCUTANEOUS
  Administered 2021-11-28: 12:00:00 5 [IU] via SUBCUTANEOUS
  Administered 2021-11-29: 2 [IU] via SUBCUTANEOUS
  Administered 2021-11-29: 7 [IU] via SUBCUTANEOUS

## 2021-11-26 MED ORDER — PANTOPRAZOLE SODIUM 40 MG PO TBEC
40.0000 mg | DELAYED_RELEASE_TABLET | Freq: Every day | ORAL | Status: DC
  Administered 2021-11-26 – 2021-11-29 (×3): 40 mg via ORAL
  Filled 2021-11-26 (×3): qty 1

## 2021-11-26 MED ORDER — ONDANSETRON HCL 4 MG PO TABS: 4.0000 mg | ORAL_TABLET | Freq: Four times a day (QID) | ORAL | Status: DC | PRN

## 2021-11-26 MED ORDER — HYDRALAZINE HCL 20 MG/ML IJ SOLN: 5.0000 mg | INTRAMUSCULAR | Status: DC | PRN

## 2021-11-26 MED ORDER — ORAL CARE MOUTH RINSE
15.0000 mL | Freq: Two times a day (BID) | OROMUCOSAL | Status: DC
  Administered 2021-11-26 – 2021-11-29 (×7): 15 mL via OROMUCOSAL

## 2021-11-26 MED ORDER — ONDANSETRON HCL 4 MG/2ML IJ SOLN: 4.0000 mg | Freq: Four times a day (QID) | INTRAMUSCULAR | Status: DC | PRN

## 2021-11-26 MED ORDER — ACETAMINOPHEN 650 MG RE SUPP: 650.0000 mg | Freq: Four times a day (QID) | RECTAL | Status: DC | PRN

## 2021-11-26 MED ORDER — MORPHINE SULFATE ER 15 MG PO TBCR
30.0000 mg | EXTENDED_RELEASE_TABLET | Freq: Two times a day (BID) | ORAL | Status: DC
  Administered 2021-11-26 – 2021-11-29 (×5): 30 mg via ORAL
  Filled 2021-11-26 (×6): qty 2

## 2021-11-26 MED ORDER — IRBESARTAN 75 MG PO TABS
37.5000 mg | ORAL_TABLET | Freq: Every day | ORAL | Status: DC
  Administered 2021-11-26 – 2021-11-29 (×3): 37.5 mg via ORAL
  Filled 2021-11-26 (×3): qty 1

## 2021-11-26 MED ORDER — SODIUM CHLORIDE 0.9 % IV SOLN
1000.0000 mL | INTRAVENOUS | Status: DC
  Administered 2021-11-26 (×2): 1000 mL via INTRAVENOUS

## 2021-11-26 MED ORDER — VALACYCLOVIR HCL 500 MG PO TABS
1000.0000 mg | ORAL_TABLET | Freq: Every day | ORAL | Status: DC
  Administered 2021-11-26 – 2021-11-29 (×3): 1000 mg via ORAL
  Filled 2021-11-26 (×4): qty 2

## 2021-11-26 MED ORDER — INSULIN ASPART 100 UNIT/ML IJ SOLN
0.0000 [IU] | Freq: Three times a day (TID) | INTRAMUSCULAR | Status: DC
Start: 2021-11-26 — End: 2021-11-26
  Administered 2021-11-26: 5 [IU] via SUBCUTANEOUS

## 2021-11-26 MED ORDER — LACTATED RINGERS IV SOLN: INTRAVENOUS | Status: DC

## 2021-11-26 MED ORDER — SODIUM CHLORIDE 0.9 % IV BOLUS (SEPSIS)
1000.0000 mL | Freq: Once | INTRAVENOUS | Status: AC
  Administered 2021-11-26: 1000 mL via INTRAVENOUS

## 2021-11-26 MED ORDER — MIRTAZAPINE 15 MG PO TABS
15.0000 mg | ORAL_TABLET | Freq: Every day | ORAL | Status: DC
  Administered 2021-11-27 – 2021-11-28 (×2): 15 mg via ORAL
  Filled 2021-11-26 (×4): qty 1

## 2021-11-26 MED ORDER — HYDROMORPHONE HCL 1 MG/ML IJ SOLN
1.0000 mg | Freq: Once | INTRAMUSCULAR | Status: AC
  Administered 2021-11-26: 1 mg via INTRAVENOUS
  Filled 2021-11-26: qty 1

## 2021-11-26 MED ORDER — HEPARIN (PORCINE) 25000 UT/250ML-% IV SOLN
1050.0000 [IU]/h | INTRAVENOUS | Status: DC
  Administered 2021-11-26: 1050 [IU]/h via INTRAVENOUS
  Filled 2021-11-26: qty 250

## 2021-11-26 MED ORDER — LATANOPROST 0.005 % OP SOLN
1.0000 [drp] | Freq: Every day | OPHTHALMIC | Status: DC
  Administered 2021-11-26 – 2021-11-27 (×2): 1 [drp] via OPHTHALMIC
  Filled 2021-11-26: qty 2.5

## 2021-11-26 MED ORDER — FENTANYL CITRATE PF 50 MCG/ML IJ SOSY
50.0000 ug | PREFILLED_SYRINGE | Freq: Once | INTRAMUSCULAR | Status: AC
  Administered 2021-11-26: 50 ug via INTRAVENOUS
  Filled 2021-11-26: qty 1

## 2021-11-26 MED ORDER — ACETAMINOPHEN 325 MG PO TABS
650.0000 mg | ORAL_TABLET | Freq: Four times a day (QID) | ORAL | Status: DC | PRN
  Administered 2021-11-28: 10:00:00 650 mg via ORAL
  Filled 2021-11-26: qty 2

## 2021-11-26 MED ORDER — MORPHINE SULFATE (PF) 2 MG/ML IV SOLN
2.0000 mg | INTRAVENOUS | Status: DC | PRN
Start: 2021-11-26 — End: 2021-11-29
  Administered 2021-11-26 – 2021-11-27 (×7): 2 mg via INTRAVENOUS
  Filled 2021-11-26: qty 1
  Filled 2021-11-26: qty 2
  Filled 2021-11-26 (×5): qty 1

## 2021-11-26 NOTE — Progress Notes (Signed)
ANTICOAGULATION CONSULT NOTE - Initial Consult  Pharmacy Consult for heparin Indication: 03/16/21 pulmonary embolus  Allergies  Allergen Reactions   Gramicidin Other (See Comments)    unknown   Sulfa Antibiotics Rash   Wound Dressing Adhesive Rash    Patient Measurements: Height: 5\' 4"  (162.6 cm) Weight: 74.4 kg (164 lb) IBW/kg (Calculated) : 54.7 Heparin Dosing Weight: 70kg  Vital Signs: Temp: 97.8 F (36.6 C) (12/12 1103) Temp Source: Oral (12/12 1103) BP: 176/50 (12/12 1103) Pulse Rate: 64 (12/12 1103)  Labs: Recent Labs    11/25/21 1832 11/25/21 2015 11/26/21 0106 11/26/21 0623 11/26/21 0942  HGB 12.2  --   --  10.4*  --   HCT 36.4  --   --  30.3*  --   PLT 197  --   --  142*  --   CREATININE 0.85  --   --  0.79  --   TROPONINIHS 11   < > 43* 66* 56*   < > = values in this interval not displayed.    Estimated Creatinine Clearance: 45.3 mL/min (by C-G formula based on SCr of 0.79 mg/dL).   Medical History: Past Medical History:  Diagnosis Date   Arthritis    Choledocholithiasis    Diabetes mellitus without complication ([place])    Diverticulitis    Esophageal cancer ([place]) dx'd 2020   GERD (gastroesophageal reflux disease)    Hyperlipidemia    Hypertension    OSA (obstructive sleep apnea)    Urine incontinence    Uterine cancer [facility])       Assessment: 87 yo W on rivaroxaban PTA for 4/122 segmental right sided PE in the setting of esophageal and uterine cancer. Held rivaroxaban for possible procedure. Last dose 12/10 PM. Pharmacy consulted for heparin.   H/H, plt down d/t dilution, no reported bleeding.   Goal of Therapy:  Heparin level 0.3-0.7 units/ml aPTT 66-102 seconds Monitor platelets by anticoagulation protocol: Yes   Plan:  Start heparin 1050 units/hr, no bolus  F/u aPTT until correlates with heparin level  F/u 8 hr aPTT  Monitor daily aPTT HL, CBC/plt Monitor for signs/symptoms of bleeding    Benetta Spar, PharmD, BCPS, BCCP Clinical  Pharmacist  Please check AMION for all [place] phone numbers After 10:00 PM, call [place]

## 2021-11-26 NOTE — H&P (View-Only) (Signed)
Consultation  Referring Provider:    Dr. Nanda Quinton  Primary Care Physician:  Carollee Herter, [name], DO Primary Gastroenterologist:   Dr. Justice Britain     Reason for Consultation:   Choledocholithiasis           HPI:   Sherry Holloway is a 87 y.o. female with history of type 2 diabetes, hypertension, uterine cancer, previous cholecystectomy, history of acute pancreatitis, history of IPMN, , history of esophageal squamous cell cancer presents for evaluation of abdominal pain.  Patient had previous ERCP with sphincterotomy 07/14/2019 in [place] Patient has history of EUS/ERCP 03/05/2021 with Dr. Rush Landmark, showed patent biliary sphinctertomy and esophageal lesion middle third of esophagus. Following with Dr. Benay Spice for squamous cell esophageal cancer after unsuccessful ESD with Dr. Judithann Sauger at [facility] on 06/28/2021, completed Xeloda and radiation on 09/04/2021.  Patient suppose to have repeat endoscopy Dec 2022, daughter was going to schedule in Jan.   Patient's daughter Sherry Holloway, nurse here in cardiology, is at bedside and gives some of the history.  Patient had done well with her 5 weeks of radiation and completed Xeloda chemotherapy. Patient denies dysphagia, odynophagia, weight loss.  Has good appetite. Patient was having some increased abdominal bloating in the past week, having hard stools due to pain medications, denies melena or hematochezia. Was in usual state of health until 1 PM yesterday when patient started to have abdominal swelling and diffuse abdominal pain, this progressed to right upper quadrant pain with some upper back pain.  Patient was having chills but no fever.  She denies nausea or vomiting.  Patient is on xarelto for history of PE, last dose was 11/24/21 in the evening.  Patient states she has tried fentanyl and morphine previously for the past which does not help, Demerol does help.  ED course:  AST 501, ALT 236, alkaline phosphatase 158, total  bilirubin 1.7 Lactic acid 2.0 CT abdomen and pelvis show 3.4 cm cystic lesion corresponding to patient's known main duct IPMN.  Moderate intrahepatic and extrahepatic ductal dilatation with suspected choledocho lithiasis in the distal common bile duct measuring up to 2.1 cm. Chest x-ray shows interstitial pulmonary edema. .Abdominal ultrasound shows fatty liver, cholecystectomy.  Previous GI work up: Last office visit 01/2021 with Ellouise Newer, PA-C EUS 07/08/19 in [place] for pancreatic mass which showed complex pancreatic head and main duct IPMN with significant pancreatic duct obstruction and dilation- showed  choledocholithiasis as well as biliary dilation was noted. ERCP with sphincterotomy on 07/14/19 in [place]  Multiple stones were removed.  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 Tissue was obtained from thisexam, and results are pending. However, the endosonographic appearance is suspicious for possible malignancy. This was staged T1sm N0 Mx by endosonographic criteria if malignancy is confirmed. 06/28/2021 EGD at [facility] for possible EDS- Malignant esophageal tumor was found in the middle  third of the esophagus. Attempted resection was  unsuccessful.- Erosive gastropathy with no bleeding and no stigmata of recent bleeding.  Past Medical History:  Diagnosis Date   Arthritis    Chicken pox    Diabetes mellitus without complication ([place])    Diverticulitis    Esophageal cancer ([place]) dx'd 2020   Esophagus cancer ([place])    GERD (gastroesophageal reflux disease)    Hyperlipidemia    Hypertension  OSA (obstructive sleep apnea)    Urine incontinence    Uterine cancer [facility])     Past Surgical History:  Procedure Laterality Date   ABDOMINAL HYSTERECTOMY  1972    APPENDECTOMY     BILIARY DILATION  03/05/2021   Procedure: BILIARY DILATION;  Surgeon: Rush Landmark Telford Nab., MD;  Location: [facility];  Service: Gastroenterology;;   BIOPSY  03/05/2021   Procedure: BIOPSY;  Surgeon: Irving Copas., MD;  Location: [facility];  Service: Gastroenterology;;   CHOLECYSTECTOMY     ENDOSCOPIC RETROGRADE CHOLANGIOPANCREATOGRAPHY (ERCP) WITH PROPOFOL N/A 03/05/2021   Procedure: ENDOSCOPIC RETROGRADE CHOLANGIOPANCREATOGRAPHY (ERCP) WITH PROPOFOL;  Surgeon: Irving Copas., MD;  Location: [facility];  Service: Gastroenterology;  Laterality: N/A;   ESOPHAGOGASTRODUODENOSCOPY (EGD) WITH PROPOFOL N/A 03/05/2021   Procedure: ESOPHAGOGASTRODUODENOSCOPY (EGD) WITH PROPOFOL;  Surgeon: Rush Landmark Telford Nab., MD;  Location: [facility];  Service: Gastroenterology;  Laterality: N/A;   EUS N/A 03/05/2021   Procedure: UPPER ENDOSCOPIC ULTRASOUND (EUS) RADIAL;  Surgeon: Irving Copas., MD;  Location: [facility];  Service: Gastroenterology;  Laterality: N/A;   LUMBAR LAMINECTOMY     REMOVAL OF STONES  03/05/2021   Procedure: REMOVAL OF STONES;  Surgeon: Rush Landmark Telford Nab., MD;  Location: [facility];  Service: Gastroenterology;;   SPINAL CORD STIMULATOR IMPLANT     SPYGLASS CHOLANGIOSCOPY N/A 03/05/2021   Procedure: OTRRNHAF CHOLANGIOSCOPY;  Surgeon: Irving Copas., MD;  Location: [facility];  Service: Gastroenterology;  Laterality: N/A;   SPYGLASS LITHOTRIPSY N/A 03/05/2021   Procedure: BXUXYBFX LITHOTRIPSY;  Surgeon: Irving Copas., MD;  Location: [facility];  Service: Gastroenterology;  Laterality: N/A;   TONSILLECTOMY     TOTAL KNEE ARTHROPLASTY Right     Family History  Problem Relation Age of Onset   Diabetes Mother    Heart disease Father    Lung cancer Sister    Lung cancer Son    Hypertension Son    Hypertension Daughter    Diabetes Son    Hypertension Son    Diabetes Daughter    Hypertension Daughter       Social History   Tobacco Use   Smoking status: Former    Types: Cigarettes    Quit date: 01/17/1989    Years since quitting: 32.8   Smokeless tobacco: Never  Vaping Use   Vaping Use: Never used  Substance Use Topics   Alcohol use: Not Currently   Drug use: Never    Prior to Admission medications   Medication Sig Start Date End Date Taking? Authorizing Provider  acetaminophen (TYLENOL) 325 MG tablet Take 352-650 mg by mouth every 6 (six) hours as needed for mild pain or moderate pain.   Yes [provider]  bumetanide (BUMEX) 1 MG tablet Take 1 tablet (1 mg total) by mouth daily. Take 1/2 to 1 tablet daily for swelling Patient taking differently: Take 0.5 mg by mouth See admin instructions. 0.59m oral daily And 0.522moral extra daily as needed for swelling 04/20/21  Yes LoRoma Schanz, DO  docusate sodium (COLACE) 100 MG capsule Take 1 capsule (100 mg total) by mouth 2 (two) times daily. Patient taking differently: Take 100 mg by mouth See admin instructions. 10037mral daily alternating with 100m33mal twice daily, repeat. 03/18/21  Yes KrisBonnielee Haff  famciclovir (FAMVIR) 125 MG tablet Take 1 tablet (125 mg total) by mouth daily. 12/25/20  Yes LownRoma SchanzDO  glimepiride (AMARYL) 4 MG tablet Take 4 mg by mouth 2 (two)  times daily.   Yes [provider]  latanoprost (XALATAN) 0.005 % ophthalmic solution Place 1 drop into both eyes at bedtime. 08/24/21  Yes Roma Schanz R, DO  lipase/protease/amylase (CREON) 36000 UNITS CPEP capsule TAKE 1 CAPSULE THREE TIMES A DAY BEFORE MEALS Patient taking differently: 36,000 Units 2 (two) times daily. 09/20/21  Yes Roma Schanz R, DO  mirtazapine (REMERON) 15 MG tablet Take 1 tablet (15 mg total) by mouth at bedtime. Patient taking differently: Take 15 mg by mouth daily with supper. 10/23/20  Yes Roma Schanz R, DO  morphine (MS CONTIN) 30 MG 12 hr tablet Take 30 mg by mouth every 12 (twelve)  hours.   Yes [provider]  Multiple Vitamins-Minerals (WOMENS 50+ MULTI VITAMIN/MIN PO) Take 1 tablet by mouth daily with supper.   Yes [provider]  omeprazole (PRILOSEC) 20 MG capsule TAKE 1 CAPSULE TWICE A DAY BEFORE MEALS Patient taking differently: Take 20 mg by mouth 2 (two) times daily before a meal. 09/20/21  Yes Carollee Herter, Yvonne R, DO  valsartan (DIOVAN) 40 MG tablet Take 1 tablet (40 mg total) by mouth daily with breakfast. 08/21/21  Yes Lowne [name], Yvonne R, DO  XARELTO 20 MG TABS tablet TAKE 1 TABLET DAILY WITH SUPPER, START ONLY AFTER THE STARTER PACK HAS BEEN COMPLETED Patient taking differently: Take 20 mg by mouth daily with supper. 10/22/21  Yes Roma Schanz R, DO  glucose blood test strip Check blood sugars twice daily 01/19/21   Carollee Herter, [name], DO    Current Facility-Administered Medications  Medication Dose Route Frequency Provider Last Rate Last Admin   0.9 %  sodium chloride infusion  1,000 mL Intravenous Continuous Cardama, Grayce Sessions, MD 125 mL/hr at 11/26/21 1102 Restarted at 11/26/21 1102   morphine 4 MG/ML injection 4 mg  4 mg Intravenous Q2H PRN Long, Wonda Olds, MD   4 mg at 11/26/21 1000    Allergies as of 11/25/2021 - Review Complete 11/25/2021  Allergen Reaction Noted   Gramicidin Other (See Comments) 01/17/2021   Sulfa antibiotics Rash 10/19/2020   Wound dressing adhesive Rash 10/19/2020    Review of Systems:    Constitutional: No weight loss, fever, chills, weakness or fatigue HEENT: Eyes: No change in vision               Ears, Nose, Throat:  No change in hearing or congestion Skin: No rash or itching Cardiovascular: No chest pain, chest pressure or palpitations   Respiratory: No SOB or cough Gastrointestinal: See HPI and otherwise negative Genitourinary: No dysuria or change in urinary frequency Neurological: No headache, dizziness or syncope Musculoskeletal: No new muscle or joint pain Hematologic: No bleeding  or bruising Psychiatric: No history of depression or anxiety     Physical Exam:  Vital signs in last 24 hours: Temp:  [97.8 F (36.6 C)-98.2 F (36.8 C)] 97.8 F (36.6 C) (12/12 1103) Pulse Rate:  [59-96] 64 (12/12 1103) Resp:  [14-31] 20 (12/12 1103) BP: (118-217)/(38-97) 176/50 (12/12 1103) SpO2:  [87 %-99 %] 97 % (12/12 1103) Weight:  [74.4 kg] 74.4 kg (12/11 1721)   General:   Pleasant, well developed female, appears younger than stated age, appears uncomfortable but in no acute distress Head:  Normocephalic and atraumatic. Eyes: scleral icterus,conjunctive icteric  Heart:  regular rate and rhythm, no murmurs or gallops Pulm: Clear anteriorly; no wheezing Abdomen:  Soft, Obese AB, skin exam normal, Hypoactive bowel sounds. moderate tenderness in the  RUQ. With guarding and Without rebound, without hepatomegaly. Extremities:  Without edema. Msk:  Symmetrical without gross deformities. Peripheral pulses intact.  Neurologic:  Alert and  oriented x4;  grossly normal neurologically. Skin:   Dry and intact without significant lesions or rashes. Psychiatric: Demonstrates good judgement and reason without abnormal affect or behaviors.  LAB RESULTS: Recent Labs    11/25/21 1832 11/26/21 0623  WBC 9.6 18.5*  HGB 12.2 10.4*  HCT 36.4 30.3*  PLT 197 142*   BMET Recent Labs    11/25/21 1832 11/26/21 0623  NA 136 136  K 4.0 3.5  CL 104 107  CO2 24 19*  GLUCOSE 273* 289*  BUN 19 17  CREATININE 0.85 0.79  CALCIUM 9.2 8.2*   LFT Recent Labs    11/26/21 0623  PROT 5.5*  ALBUMIN 2.9*  AST 529*  ALT 471*  ALKPHOS 133*  BILITOT 3.2*   PT/INR No results for input(s): LABPROT, INR in the last 72 hours.  STUDIES: CT ABDOMEN PELVIS W CONTRAST  Result Date: 11/25/2021 CLINICAL DATA:  Abdominal pain, bloating x1 week, constipation EXAM: CT ABDOMEN AND PELVIS WITH CONTRAST TECHNIQUE: Multidetector CT imaging of the abdomen and pelvis was performed using the standard  protocol following bolus administration of intravenous contrast. CONTRAST:  111m OMNIPAQUE IOHEXOL 300 MG/ML  SOLN COMPARISON:  PET-CT dated 04/02/2021. CT pelvis dated 03/15/2021. CT abdomen dated 01/25/2021. FINDINGS: Lower chest: Scarring in the lung bases. Hepatobiliary: Liver is within normal limits. Moderate intrahepatic and extrahepatic ductal dilatation. Common duct measures 19 mm, similar to the prior. Associated soft tissue in the distal CBD, measuring up to 21 mm (series 5/image 70), likely reflecting choledocholithiasis. Pancreas: Dilated main pancreatic duct measuring up to 13 mm at the ampulla (series 2/image 42). 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 Mild diffuse parenchymal atrophy. Spleen: Within normal limits. Adrenals/Urinary Tract: Adrenal glands are within normal limits. Kidneys are within normal limits.  No hydronephrosis. Bladder is within normal limits. Stomach/Bowel: Stomach is notable for a tiny hiatal hernia. Duodenal diverticulum (series 2/image 40). No evidence of bowel obstruction. Appendix is not discretely visualized. Sigmoid diverticulosis, without evidence of diverticulitis. Vascular/Lymphatic: No evidence of abdominal aortic aneurysm. Atherosclerotic calcifications of the abdominal aorta and branch vessels. No suspicious abdominopelvic lymphadenopathy. Reproductive: Status post hysterectomy. No adnexal masses. Other: No abdominopelvic ascites. Musculoskeletal: Degenerative changes of the visualized thoracolumbar spine. Left hip arthroplasty, without evidence of complication. Associated streak artifact. 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 Associated pancreatic ductal dilatation and mild diffuse parenchymal atrophy. Moderate  intrahepatic and extrahepatic ductal dilatation with suspected choledocholithiasis in the distal common duct measuring up to 2.1 cm. No evidence of bowel obstruction. Sigmoid diverticulosis, without evidence of diverticulitis. Electronically Signed   By: SJulian HyM.D.   On: 11/25/2021 20:07   DG Chest Portable 1 View  Result Date: 11/25/2021 CLINICAL DATA:  Hypoxemia. EXAM: PORTABLE CHEST 1 VIEW COMPARISON:  Chest CT dated 03/15/2021. FINDINGS: There is diffuse interstitial prominence with Kerley B-lines consistent with edema. Minimal left lung base atelectasis. No consolidative changes. There is no pleural effusion pneumothorax. The cardiac silhouette is within limits. Atherosclerotic calcification of the aorta. Degenerative changes of the spine. No acute osseous pathology. Thoracic spine stimulator. IMPRESSION: Interstitial pulmonary edema. Electronically Signed   By: ALaren EvertsD.  On: 11/25/2021 20:52   US Abdomen Limited RUQ (LIVER/GB)  Result Date: 11/25/2021 CLINICAL DATA:  Abdominal pain. EXAM: ULTRASOUND ABDOMEN LIMITED RIGHT UPPER QUADRANT COMPARISON:  None. FINDINGS: Gallbladder: Cholecystectomy. Common bile duct: Diameter: 13 mm. The common bile duct is dilated, likely post cholecystectomy. Liver: There is diffuse increased liver echogenicity most commonly seen in the setting of fatty infiltration. Superimposed inflammation or fibrosis is not excluded. Clinical correlation is recommended. Portal vein is patent on color Doppler imaging with normal direction of blood flow towards the liver. Other: None. IMPRESSION: 1. Fatty liver. 2. Cholecystectomy. Electronically Signed   By: Anner Crete M.D.   On: 11/25/2021 20:55     Impression     Choledocholithiasis  WBC 18.5 HGB 10.4 MCV 88.3 Platelets 142 AST 529 (501) ALT 471(236) Alkphos 133(158) TBili 3.2 (1.7) Lactic acid 2.0 CT suspect for choledocholithiasis Patient with previous cholecystectomy, ERCP 06/2019 with  sphincterotomy at that time. ERCP 02/2021 showed patent sphincterotomy. Now with abdominal pain, chills, leukocytosis, elevated AST/ALT, alk phos and total bili   Squamous esophageal cancer completed Xeloda and radiation on 09/04/2021 Due for repeat EGD Dec 2022 or Jan 2023   Leukocytosis Elevated lactic acid, trending down Patient currently on Zosyn   History of pulmonary embolus Last dose of Xarleto 12/10   main duct IPMN Normal lipase at 21    Plan   -Will add on INR for possible SIRS criteria -Will repeat CBC and CMET in the morning  -Continue supportive care -Continue pain control, patient has not responded well to morphine and fentanyl in the past.  Has done well with Demerol per daughter so, who is a Marine scientist. -Continue IV hydration, can do clear liquids today, n.p.o. at midnight. -Will discuss with Dr. Ardis Hughs about potentially doing endoscopy with ERCP as patient is due this December after completing radiation and chemotherapy for esophageal mass. -I thoroughly discussed procedure with the patient to include nature, alternatives, benefits, and risks (including but not limited to post ERCP pancreatitis, bleeding, infection, perforation, anesthesia/cardiac pulmonary complications).  Patient verbalized understanding and gave verbal consent to proceed with ERCP.  Thank you for your kind consultation, we will continue to follow.  Vladimir Crofts  11/26/2021, 11:59 AM   ________________________________________________________________________  Velora Heckler GI MD note:  I personally examined the patient, reviewed the data and agree with the assessment and plan described above.  She has recurrent CBD stones on CT.  I am planning ERCP tomorrow and will plan to get a good look at her esophagus as well.  Liquids for now.    Owens Loffler, MD [facility] Pager [phone]

## 2021-11-26 NOTE — ED Notes (Signed)
[facility] at bedside to transport patient. 

## 2021-11-26 NOTE — H&P (Signed)
History and Physical    Sherry Holloway TGY:[id number] DOB: [birth year] DOA: 11/25/2021  PCP: Ann Held, DO Consultants:  [facility]; [place] - oncology; [place] - neurology; [facility] - pain management; [facility] Patient coming from:  Home - lives alone moved here from [place] a year ago; NOK: Daughter, [phone]  Chief Complaint: Abdominal pain  HPI: Sherry Holloway is a 87 y.o. female with medical history significant of DM; esophageal cancer; uterine cancer; PE on Xarelto; HTN; HLD; and OSA presenting with abdominal pain.   Patient with prior h/o choledocholithiasis s/p cholecystectomy and ERCP presenting with recurrent choledocholithiasis.  She developed abdominal pain yesterday afternoon.  +distention.  She called her daughter to come get her.  She was sitting when it started.  ?related to eating.  No n/v.  She has chronic constipation, last BM was yesterday with 1 hard stool.  She is still having pain in the RUQ and midepigastric region.    The issue started 2 1/2 years ago.  They thought it was cancer and sent her to [facility], a bigger hospital.  She had an ERCP and took biopsies, saw stones and blockages but didn't do anything about it because they expected it to be cancer; repeat ERCP with stone retrieval and things improved.  She was lost to f/u because of her husband's illness and COVID and then she finally moved to [facility].  Dr. Rush Landmark scoped her and she wasn't having symptoms but he saw lots of stones and removed them.  He incidentally found esophageal at that time.  She was also found to incidentally have PE (staging imaging) and was started on Xarelto.  This is the first time she has had abdominal pain since initial presentation.  She has esophageal cancer which was incidentally diagnosed in 02/2021, found to be invasive on biopsy.  She was treated with radiation and Xeloda.      ED Course: [facility] to [facility], per Dr. Hal Hope:  87 year old female with history of  pulmonary embolism on Xarelto, esophageal cancer prior history of choledocholithiasis underwent ERCP in March of this year by Dr. Rush Landmark presents abdominal discomfort is found to have a choledocholithiasis.  ER physician did discuss with [facility] will be seeing patient in consult.  Patient admitted for further work-up of choledocholithiasis.  LFTs elevated.  Patient blood pressure is also elevated was given labetalol we will closely monitor.  COVID test is pending.  Review of Systems: As per HPI; otherwise review of systems reviewed and negative.   Ambulatory Status:  Ambulates with a walker  COVID Vaccine Status:  Complete plus boosters  Past Medical History:  Diagnosis Date   Arthritis    Choledocholithiasis    Diabetes mellitus without complication ([place])    Diverticulitis    Esophageal cancer ([place]) dx'd 2020   GERD (gastroesophageal reflux disease)    Hyperlipidemia    Hypertension    OSA (obstructive sleep apnea)    Urine incontinence    Uterine cancer [facility])     Past Surgical History:  Procedure Laterality Date   ABDOMINAL HYSTERECTOMY  1972   APPENDECTOMY     BILIARY DILATION  03/05/2021   Procedure: BILIARY DILATION;  Surgeon: Irving Copas., MD;  Location: [facility];  Service: Gastroenterology;;   BIOPSY  03/05/2021   Procedure: BIOPSY;  Surgeon: Irving Copas., MD;  Location: [facility];  Service: Gastroenterology;;   CHOLECYSTECTOMY     ENDOSCOPIC RETROGRADE CHOLANGIOPANCREATOGRAPHY (ERCP) WITH PROPOFOL N/A 03/05/2021   Procedure: ENDOSCOPIC RETROGRADE  CHOLANGIOPANCREATOGRAPHY (ERCP) WITH PROPOFOL;  Surgeon: Mansouraty, Telford Nab., MD;  Location: [facility];  Service: Gastroenterology;  Laterality: N/A;   ESOPHAGOGASTRODUODENOSCOPY (EGD) WITH PROPOFOL N/A 03/05/2021   Procedure: ESOPHAGOGASTRODUODENOSCOPY (EGD) WITH PROPOFOL;  Surgeon: Rush Landmark Telford Nab., MD;  Location: [facility];  Service: Gastroenterology;  Laterality: N/A;   EUS N/A  03/05/2021   Procedure: UPPER ENDOSCOPIC ULTRASOUND (EUS) RADIAL;  Surgeon: Irving Copas., MD;  Location: [facility];  Service: Gastroenterology;  Laterality: N/A;   LUMBAR LAMINECTOMY     REMOVAL OF STONES  03/05/2021   Procedure: REMOVAL OF STONES;  Surgeon: Rush Landmark Telford Nab., MD;  Location: [facility];  Service: Gastroenterology;;   SPINAL CORD STIMULATOR IMPLANT     SPYGLASS CHOLANGIOSCOPY N/A 03/05/2021   Procedure: WEXHBZJI CHOLANGIOSCOPY;  Surgeon: Irving Copas., MD;  Location: [facility];  Service: Gastroenterology;  Laterality: N/A;   SPYGLASS LITHOTRIPSY N/A 03/05/2021   Procedure: RCVELFYB LITHOTRIPSY;  Surgeon: Irving Copas., MD;  Location: [facility];  Service: Gastroenterology;  Laterality: N/A;   TONSILLECTOMY     TOTAL KNEE ARTHROPLASTY Right     Social History   Socioeconomic History   Marital status: Widowed    Spouse name: Not on file   Number of children: 4   Years of education: Not on file   Highest education level: Not on file  Occupational History   Occupation: Retired Quarry manager  Tobacco Use   Smoking status: Former    Packs/day: 1.00    Years: 35.00    Pack years: 35.00    Types: Cigarettes    Quit date: 01/17/1989    Years since quitting: 32.8   Smokeless tobacco: Never  Vaping Use   Vaping Use: Never used  Substance and Sexual Activity   Alcohol use: Not Currently   Drug use: Never   Sexual activity: Not Currently  Other Topics Concern   Not on file  Social History Narrative   Just moved from [place]--- and moved into an apt 2.4 miles from her daughter    Social Determinants of Health   Financial Resource Strain: Not on file  Food Insecurity: Not on file  Transportation Needs: Not on file  Physical Activity: Not on file  Stress: Not on file  Social Connections: Not on file  Intimate Partner Violence: Not At Risk   Fear of Current or Ex-Partner: No   Emotionally Abused: No   Physically Abused: No   Sexually  Abused: No    Allergies  Allergen Reactions   Gramicidin Other (See Comments)    unknown   Sulfa Antibiotics Rash   Wound Dressing Adhesive Rash    Family History  Problem Relation Age of Onset   Diabetes Mother    Heart disease Father    Lung cancer Sister    Lung cancer Son    Hypertension Son    Hypertension Daughter    Diabetes Son    Hypertension Son    Diabetes Daughter    Hypertension Daughter     Prior to Admission medications   Medication Sig Start Date End Date Taking? Authorizing Provider  acetaminophen (TYLENOL) 325 MG tablet Take 352-650 mg by mouth every 6 (six) hours as needed for mild pain or moderate pain.   Yes [provider]  bumetanide (BUMEX) 1 MG tablet Take 1 tablet (1 mg total) by mouth daily. Take 1/2 to 1 tablet daily for swelling Patient taking differently: Take 0.5 mg by mouth See admin instructions. 0.5mg  oral daily And 0.5mg  oral  extra daily as needed for swelling 04/20/21  Yes Roma Schanz R, DO  docusate sodium (COLACE) 100 MG capsule Take 1 capsule (100 mg total) by mouth 2 (two) times daily. Patient taking differently: Take 100 mg by mouth See admin instructions. 100mg  oral daily alternating with 100mg  oral twice daily, repeat. 03/18/21  Yes Bonnielee Haff, MD  famciclovir (FAMVIR) 125 MG tablet Take 1 tablet (125 mg total) by mouth daily. 12/25/20  Yes Roma Schanz R, DO  glimepiride (AMARYL) 4 MG tablet Take 4 mg by mouth 2 (two) times daily.   Yes [provider]  latanoprost (XALATAN) 0.005 % ophthalmic solution Place 1 drop into both eyes at bedtime. 08/24/21  Yes Roma Schanz R, DO  lipase/protease/amylase (CREON) 36000 UNITS CPEP capsule TAKE 1 CAPSULE THREE TIMES A DAY BEFORE MEALS Patient taking differently: 36,000 Units 2 (two) times daily. 09/20/21  Yes Roma Schanz R, DO  mirtazapine (REMERON) 15 MG tablet Take 1 tablet (15 mg total) by mouth at bedtime. Patient taking differently: Take 15 mg  by mouth daily with supper. 10/23/20  Yes Roma Schanz R, DO  morphine (MS CONTIN) 30 MG 12 hr tablet Take 30 mg by mouth every 12 (twelve) hours.   Yes [provider]  Multiple Vitamins-Minerals (WOMENS 50+ MULTI VITAMIN/MIN PO) Take 1 tablet by mouth daily with supper.   Yes [provider]  omeprazole (PRILOSEC) 20 MG capsule TAKE 1 CAPSULE TWICE A DAY BEFORE MEALS Patient taking differently: Take 20 mg by mouth 2 (two) times daily before a meal. 09/20/21  Yes Carollee Herter, Yvonne R, DO  valsartan (DIOVAN) 40 MG tablet Take 1 tablet (40 mg total) by mouth daily with breakfast. 08/21/21  Yes Lowne [name], Yvonne R, DO  XARELTO 20 MG TABS tablet TAKE 1 TABLET DAILY WITH SUPPER, START ONLY AFTER THE STARTER PACK HAS BEEN COMPLETED Patient taking differently: Take 20 mg by mouth daily with supper. 10/22/21  Yes Roma Schanz R, DO  glucose blood test strip Check blood sugars twice daily 01/19/21   Ann Held, DO    Physical Exam: Vitals:   11/26/21 0745 11/26/21 0800 11/26/21 0815 11/26/21 1103  BP: (!) 166/50 (!) 158/49 (!) 166/51 (!) 176/50  Pulse: 62 61 (!) 59 64  Resp: 20 20 20 20   Temp:    97.8 F (36.6 C)  TempSrc:    Oral  SpO2: 97% 98% 98% 97%  Weight:      Height:         General:  Appears calm and comfortable and is in NAD Eyes:  EOMI, normal lids, iris ENT:  hard of hearing, grossly normal lips & tongue, mmm; artificial dentition Neck:  no LAD, masses or thyromegaly Cardiovascular:  RRR, no m/r/g. No LE edema.  Respiratory:   CTA bilaterally with no wheezes/rales/rhonchi.  Normal respiratory effort. Abdomen:  soft, TTP in RUQ > epigastric region, mildly distended, NABS Skin:  no rash or induration seen on limited exam Musculoskeletal:  grossly normal tone BUE/BLE, good ROM, no bony abnormality Psychiatric:  grossly normal mood and affect, speech fluent and appropriate, AOx3 Neurologic:  CN 2-12 grossly intact, moves all extremities in  coordinated fashion    Radiological Exams on Admission: Independently reviewed - see discussion in A/P where applicable  CT ABDOMEN PELVIS W CONTRAST  Result Date: 11/25/2021 CLINICAL DATA:  Abdominal pain, bloating x1 week, constipation EXAM: CT ABDOMEN AND PELVIS WITH CONTRAST TECHNIQUE: Multidetector CT imaging of  the abdomen and pelvis was performed using the standard protocol following bolus administration of intravenous contrast. CONTRAST:  141mL OMNIPAQUE IOHEXOL 300 MG/ML  SOLN COMPARISON:  PET-CT dated 04/02/2021. CT pelvis dated 03/15/2021. CT abdomen dated 01/25/2021. FINDINGS: Lower chest: Scarring in the lung bases. Hepatobiliary: Liver is within normal limits. Moderate intrahepatic and extrahepatic ductal dilatation. Common duct measures 19 mm, similar to the prior. Associated soft tissue in the distal CBD, measuring up to 21 mm (series 5/image 70), likely reflecting choledocholithiasis. Pancreas: Dilated main pancreatic duct measuring up to 13 mm at the ampulla (series 2/image 42). 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 Mild diffuse parenchymal atrophy. Spleen: Within normal limits. Adrenals/Urinary Tract: Adrenal glands are within normal limits. Kidneys are within normal limits.  No hydronephrosis. Bladder is within normal limits. Stomach/Bowel: Stomach is notable for a tiny hiatal hernia. Duodenal diverticulum (series 2/image 40). No evidence of bowel obstruction. Appendix is not discretely visualized. Sigmoid diverticulosis, without evidence of diverticulitis. Vascular/Lymphatic: No evidence of abdominal aortic aneurysm. Atherosclerotic calcifications of the abdominal aorta and branch vessels. No suspicious abdominopelvic lymphadenopathy. Reproductive: Status post hysterectomy. No adnexal masses. Other: No abdominopelvic  ascites. Musculoskeletal: Degenerative changes of the visualized thoracolumbar spine. Left hip arthroplasty, without evidence of complication. Associated streak artifact. 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 Associated pancreatic ductal dilatation and mild diffuse parenchymal atrophy. Moderate intrahepatic and extrahepatic ductal dilatation with suspected choledocholithiasis in the distal common duct measuring up to 2.1 cm. No evidence of bowel obstruction. Sigmoid diverticulosis, without evidence of diverticulitis. Electronically Signed   By: Julian Hy M.D.   On: 11/25/2021 20:07   DG Chest Portable 1 View  Result Date: 11/25/2021 CLINICAL DATA:  Hypoxemia. EXAM: PORTABLE CHEST 1 VIEW COMPARISON:  Chest CT dated 03/15/2021. FINDINGS: There is diffuse interstitial prominence with Kerley B-lines consistent with edema. Minimal left lung base atelectasis. No consolidative changes. There is no pleural effusion pneumothorax. The cardiac silhouette is within limits. Atherosclerotic calcification of the aorta. Degenerative changes of the spine. No acute osseous pathology. Thoracic spine stimulator. IMPRESSION: Interstitial pulmonary edema. Electronically Signed   By: Anner Crete M.D.   On: 11/25/2021 20:52   US Abdomen Limited RUQ (LIVER/GB)  Result Date: 11/25/2021 CLINICAL DATA:  Abdominal pain. EXAM: ULTRASOUND ABDOMEN LIMITED RIGHT UPPER QUADRANT COMPARISON:  None. FINDINGS: Gallbladder: Cholecystectomy. Common bile duct: Diameter: 13 mm. The common bile duct is dilated, likely post cholecystectomy. Liver: There is diffuse increased liver echogenicity most commonly seen in the setting of fatty infiltration. Superimposed inflammation or fibrosis is not excluded. Clinical correlation is recommended. Portal vein is patent on color Doppler imaging with normal direction of blood flow towards the liver. Other: None.  IMPRESSION: 1. Fatty liver. 2. Cholecystectomy. Electronically Signed   By: Anner Crete M.D.   On: 11/25/2021 20:55    EKG: Independently reviewed.  NSR with rate 75; RBBB and LAFB, LVH; NSCSLT   Labs on Admission: I have personally reviewed the available labs and imaging studies at the time of the admission.  Pertinent labs:   Glucose 289 Albumin 2.9 AST 529/ALT 471/Bili 3.2; 501/236/1.7 this AM but normal on 10/17 HS troponin 18, 43, 66, 56 Lactate 2.0, 2.7, 3.4, 2.6 WBC 18.5 Hgb 10.4 Platelets 142 COVID/flu negative UA: ?500 glucose, small Hgb, trace LE, 30 protein; few  bacteria   Assessment/Plan Principal Problem:   Choledocholithiasis Active Problems:   Hypertension   Type 2 diabetes mellitus with hyperglycemia, without long-term current use of insulin (HCC)   Pulmonary embolus (HCC)   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)   Chronic pain disorder   Choledocholithiasis -Patient with prior h/o post-cholecystectomy choledocholithasis presenting with worsening RUQ/midepigastric pain -Imaging shows choledocholithiasis -Clear liquids, NPO after midnight -Morphine for pain, Zofran for nausea -GI is consulting -Patient is being scheduled for ERCP tomorrow with Dr. Ardis Hughs  Esophageal cancer -Patient with known invasive malignancy, s/p radiation and Xeloda -She is due for surveillance EGD and hopefully this can be done at the same time as ERCP  PE -Diagnosed incidentally with cancer staging -Given VTE in the presence of active malignancy, she needs indefinite AC -She is on Xarelto -Will transition to Heparin for now  DM -Recent A1c was 8.7 -hold Amaryl -Cover with moderate-scale SSI   HTN -Continue valsartan  Glaucoma  -Continue latanoprost  Chronic pain -I have reviewed this patient in the [place] Controlled Substances Reporting System.  She is receiving medications from only one provider and appears to be taking them as prescribed. -She is at increased  risk of opioid misuse, diversion, or overdose.  -Continue MS Contin  Mood d/o -Continue mirtazepine    Note: This patient has been tested and is negative for the novel coronavirus COVID-19. The patient has been fully vaccinated against COVID-19.   Level of care: Med-Surg DVT prophylaxis: Heparin Code Status:  DNR - confirmed with patient/family Family Communication: Daughter was present throughout evaluation Disposition Plan:  The patient is from: home  Anticipated d/c is to: home without [facility] services  Anticipated d/c date will depend on clinical response to treatment, likely 2-3 days  Patient is currently: acutely ill Consults called: GI  Admission status:  Admit - It is my clinical opinion that admission to [place] is reasonable and necessary because of the expectation that this patient will require hospital care that crosses at least 2 midnights to treat this condition based on the medical complexity of the problems presented.  Given the aforementioned information, the predictability of an adverse outcome is felt to be significant.    Karmen Bongo MD [facility]   How to contact the [facility] Attending or Consulting provider [place] or covering provider during after hours [place], for this patient?  Check the care team in [facility] and look for a) attending/consulting [facility] provider listed and b) the [facility] team listed Log into [email or web address] and use [place]'s universal password to access. If you do not have the password, please contact the hospital operator. Locate the [facility] provider you are looking for under [facility] and page to a number that you can be directly reached. If you still have difficulty reaching the provider, please page the [facility] (Director on Call) for the Hospitalists listed on amion for assistance.   11/26/2021, 2:10 PM

## 2021-11-26 NOTE — Progress Notes (Signed)
Pt arrive from [facility], drowsy but arousable. Call button in reach, vitals stable.

## 2021-11-26 NOTE — ED Notes (Signed)
[facility] given report for transport

## 2021-11-26 NOTE — Consult Note (Addendum)
Consultation  Referring Provider:    Dr. Nanda Quinton  Primary Care Physician:  Carollee Herter, [name], DO Primary Gastroenterologist:   Dr. Justice Britain     Reason for Consultation:   Choledocholithiasis           HPI:   Sherry Holloway is a 87 y.o. female with history of type 2 diabetes, hypertension, uterine cancer, previous cholecystectomy, history of acute pancreatitis, history of IPMN, , history of esophageal squamous cell cancer presents for evaluation of abdominal pain.  Patient had previous ERCP with sphincterotomy 07/14/2019 in [place] Patient has history of EUS/ERCP 03/05/2021 with Dr. Rush Landmark, showed patent biliary sphinctertomy and esophageal lesion middle third of esophagus. Following with Dr. Benay Spice for squamous cell esophageal cancer after unsuccessful ESD with Dr. Judithann Sauger at [facility] on 06/28/2021, completed Xeloda and radiation on 09/04/2021.  Patient suppose to have repeat endoscopy Dec 2022, daughter was going to schedule in Jan.   Patient's daughter Sherry Holloway, nurse here in cardiology, is at bedside and gives some of the history.  Patient had done well with her 5 weeks of radiation and completed Xeloda chemotherapy. Patient denies dysphagia, odynophagia, weight loss.  Has good appetite. Patient was having some increased abdominal bloating in the past week, having hard stools due to pain medications, denies melena or hematochezia. Was in usual state of health until 1 PM yesterday when patient started to have abdominal swelling and diffuse abdominal pain, this progressed to right upper quadrant pain with some upper back pain.  Patient was having chills but no fever.  She denies nausea or vomiting.  Patient is on xarelto for history of PE, last dose was 11/24/21 in the evening.  Patient states she has tried fentanyl and morphine previously for the past which does not help, Demerol does help.  ED course:  AST 501, ALT 236, alkaline phosphatase 158, total  bilirubin 1.7 Lactic acid 2.0 CT abdomen and pelvis show 3.4 cm cystic lesion corresponding to patient's known main duct IPMN.  Moderate intrahepatic and extrahepatic ductal dilatation with suspected choledocho lithiasis in the distal common bile duct measuring up to 2.1 cm. Chest x-ray shows interstitial pulmonary edema. .Abdominal ultrasound shows fatty liver, cholecystectomy.  Previous GI work up: Last office visit 01/2021 with Ellouise Newer, PA-C EUS 07/08/19 in [place] for pancreatic mass which showed complex pancreatic head and main duct IPMN with significant pancreatic duct obstruction and dilation- showed  choledocholithiasis as well as biliary dilation was noted. ERCP with sphincterotomy on 07/14/19 in [place]  Multiple stones were removed.  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 Tissue was obtained from thisexam, and results are pending. However, the endosonographic appearance is suspicious for possible malignancy. This was staged T1sm N0 Mx by endosonographic criteria if malignancy is confirmed. 06/28/2021 EGD at [facility] for possible EDS- Malignant esophageal tumor was found in the middle  third of the esophagus. Attempted resection was  unsuccessful.- Erosive gastropathy with no bleeding and no stigmata of recent bleeding.  Past Medical History:  Diagnosis Date   Arthritis    Chicken pox    Diabetes mellitus without complication ([place])    Diverticulitis    Esophageal cancer ([place]) dx'd 2020   Esophagus cancer ([place])    GERD (gastroesophageal reflux disease)    Hyperlipidemia    Hypertension  OSA (obstructive sleep apnea)    Urine incontinence    Uterine cancer [facility])     Past Surgical History:  Procedure Laterality Date   ABDOMINAL HYSTERECTOMY  1972    APPENDECTOMY     BILIARY DILATION  03/05/2021   Procedure: BILIARY DILATION;  Surgeon: Rush Landmark Telford Nab., MD;  Location: [facility];  Service: Gastroenterology;;   BIOPSY  03/05/2021   Procedure: BIOPSY;  Surgeon: Irving Copas., MD;  Location: [facility];  Service: Gastroenterology;;   CHOLECYSTECTOMY     ENDOSCOPIC RETROGRADE CHOLANGIOPANCREATOGRAPHY (ERCP) WITH PROPOFOL N/A 03/05/2021   Procedure: ENDOSCOPIC RETROGRADE CHOLANGIOPANCREATOGRAPHY (ERCP) WITH PROPOFOL;  Surgeon: Irving Copas., MD;  Location: [facility];  Service: Gastroenterology;  Laterality: N/A;   ESOPHAGOGASTRODUODENOSCOPY (EGD) WITH PROPOFOL N/A 03/05/2021   Procedure: ESOPHAGOGASTRODUODENOSCOPY (EGD) WITH PROPOFOL;  Surgeon: Rush Landmark Telford Nab., MD;  Location: [facility];  Service: Gastroenterology;  Laterality: N/A;   EUS N/A 03/05/2021   Procedure: UPPER ENDOSCOPIC ULTRASOUND (EUS) RADIAL;  Surgeon: Irving Copas., MD;  Location: [facility];  Service: Gastroenterology;  Laterality: N/A;   LUMBAR LAMINECTOMY     REMOVAL OF STONES  03/05/2021   Procedure: REMOVAL OF STONES;  Surgeon: Rush Landmark Telford Nab., MD;  Location: [facility];  Service: Gastroenterology;;   SPINAL CORD STIMULATOR IMPLANT     SPYGLASS CHOLANGIOSCOPY N/A 03/05/2021   Procedure: YOVZCHYI CHOLANGIOSCOPY;  Surgeon: Irving Copas., MD;  Location: [facility];  Service: Gastroenterology;  Laterality: N/A;   SPYGLASS LITHOTRIPSY N/A 03/05/2021   Procedure: FOYDXAJO LITHOTRIPSY;  Surgeon: Irving Copas., MD;  Location: [facility];  Service: Gastroenterology;  Laterality: N/A;   TONSILLECTOMY     TOTAL KNEE ARTHROPLASTY Right     Family History  Problem Relation Age of Onset   Diabetes Mother    Heart disease Father    Lung cancer Sister    Lung cancer Son    Hypertension Son    Hypertension Daughter    Diabetes Son    Hypertension Son    Diabetes Daughter    Hypertension Daughter       Social History   Tobacco Use   Smoking status: Former    Types: Cigarettes    Quit date: 01/17/1989    Years since quitting: 32.8   Smokeless tobacco: Never  Vaping Use   Vaping Use: Never used  Substance Use Topics   Alcohol use: Not Currently   Drug use: Never    Prior to Admission medications   Medication Sig Start Date End Date Taking? Authorizing Provider  acetaminophen (TYLENOL) 325 MG tablet Take 352-650 mg by mouth every 6 (six) hours as needed for mild pain or moderate pain.   Yes [provider]  bumetanide (BUMEX) 1 MG tablet Take 1 tablet (1 mg total) by mouth daily. Take 1/2 to 1 tablet daily for swelling Patient taking differently: Take 0.5 mg by mouth See admin instructions. 0.31m oral daily And 0.569moral extra daily as needed for swelling 04/20/21  Yes LoRoma Schanz, DO  docusate sodium (COLACE) 100 MG capsule Take 1 capsule (100 mg total) by mouth 2 (two) times daily. Patient taking differently: Take 100 mg by mouth See admin instructions. 10062mral daily alternating with 100m64mal twice daily, repeat. 03/18/21  Yes KrisBonnielee Haff  famciclovir (FAMVIR) 125 MG tablet Take 1 tablet (125 mg total) by mouth daily. 12/25/20  Yes LownRoma SchanzDO  glimepiride (AMARYL) 4 MG tablet Take 4 mg by mouth 2 (two)  times daily.   Yes [provider]  latanoprost (XALATAN) 0.005 % ophthalmic solution Place 1 drop into both eyes at bedtime. 08/24/21  Yes Roma Schanz R, DO  lipase/protease/amylase (CREON) 36000 UNITS CPEP capsule TAKE 1 CAPSULE THREE TIMES A DAY BEFORE MEALS Patient taking differently: 36,000 Units 2 (two) times daily. 09/20/21  Yes Roma Schanz R, DO  mirtazapine (REMERON) 15 MG tablet Take 1 tablet (15 mg total) by mouth at bedtime. Patient taking differently: Take 15 mg by mouth daily with supper. 10/23/20  Yes Roma Schanz R, DO  morphine (MS CONTIN) 30 MG 12 hr tablet Take 30 mg by mouth every 12 (twelve)  hours.   Yes [provider]  Multiple Vitamins-Minerals (WOMENS 50+ MULTI VITAMIN/MIN PO) Take 1 tablet by mouth daily with supper.   Yes [provider]  omeprazole (PRILOSEC) 20 MG capsule TAKE 1 CAPSULE TWICE A DAY BEFORE MEALS Patient taking differently: Take 20 mg by mouth 2 (two) times daily before a meal. 09/20/21  Yes Carollee Herter, Yvonne R, DO  valsartan (DIOVAN) 40 MG tablet Take 1 tablet (40 mg total) by mouth daily with breakfast. 08/21/21  Yes Lowne [name], Yvonne R, DO  XARELTO 20 MG TABS tablet TAKE 1 TABLET DAILY WITH SUPPER, START ONLY AFTER THE STARTER PACK HAS BEEN COMPLETED Patient taking differently: Take 20 mg by mouth daily with supper. 10/22/21  Yes Roma Schanz R, DO  glucose blood test strip Check blood sugars twice daily 01/19/21   Carollee Herter, [name], DO    Current Facility-Administered Medications  Medication Dose Route Frequency Provider Last Rate Last Admin   0.9 %  sodium chloride infusion  1,000 mL Intravenous Continuous Cardama, Grayce Sessions, MD 125 mL/hr at 11/26/21 1102 Restarted at 11/26/21 1102   morphine 4 MG/ML injection 4 mg  4 mg Intravenous Q2H PRN Long, Wonda Olds, MD   4 mg at 11/26/21 1000    Allergies as of 11/25/2021 - Review Complete 11/25/2021  Allergen Reaction Noted   Gramicidin Other (See Comments) 01/17/2021   Sulfa antibiotics Rash 10/19/2020   Wound dressing adhesive Rash 10/19/2020    Review of Systems:    Constitutional: No weight loss, fever, chills, weakness or fatigue HEENT: Eyes: No change in vision               Ears, Nose, Throat:  No change in hearing or congestion Skin: No rash or itching Cardiovascular: No chest pain, chest pressure or palpitations   Respiratory: No SOB or cough Gastrointestinal: See HPI and otherwise negative Genitourinary: No dysuria or change in urinary frequency Neurological: No headache, dizziness or syncope Musculoskeletal: No new muscle or joint pain Hematologic: No bleeding  or bruising Psychiatric: No history of depression or anxiety     Physical Exam:  Vital signs in last 24 hours: Temp:  [97.8 F (36.6 C)-98.2 F (36.8 C)] 97.8 F (36.6 C) (12/12 1103) Pulse Rate:  [59-96] 64 (12/12 1103) Resp:  [14-31] 20 (12/12 1103) BP: (118-217)/(38-97) 176/50 (12/12 1103) SpO2:  [87 %-99 %] 97 % (12/12 1103) Weight:  [74.4 kg] 74.4 kg (12/11 1721)   General:   Pleasant, well developed female, appears younger than stated age, appears uncomfortable but in no acute distress Head:  Normocephalic and atraumatic. Eyes: scleral icterus,conjunctive icteric  Heart:  regular rate and rhythm, no murmurs or gallops Pulm: Clear anteriorly; no wheezing Abdomen:  Soft, Obese AB, skin exam normal, Hypoactive bowel sounds. moderate tenderness in the  RUQ. With guarding and Without rebound, without hepatomegaly. Extremities:  Without edema. Msk:  Symmetrical without gross deformities. Peripheral pulses intact.  Neurologic:  Alert and  oriented x4;  grossly normal neurologically. Skin:   Dry and intact without significant lesions or rashes. Psychiatric: Demonstrates good judgement and reason without abnormal affect or behaviors.  LAB RESULTS: Recent Labs    11/25/21 1832 11/26/21 0623  WBC 9.6 18.5*  HGB 12.2 10.4*  HCT 36.4 30.3*  PLT 197 142*   BMET Recent Labs    11/25/21 1832 11/26/21 0623  NA 136 136  K 4.0 3.5  CL 104 107  CO2 24 19*  GLUCOSE 273* 289*  BUN 19 17  CREATININE 0.85 0.79  CALCIUM 9.2 8.2*   LFT Recent Labs    11/26/21 0623  PROT 5.5*  ALBUMIN 2.9*  AST 529*  ALT 471*  ALKPHOS 133*  BILITOT 3.2*   PT/INR No results for input(s): LABPROT, INR in the last 72 hours.  STUDIES: CT ABDOMEN PELVIS W CONTRAST  Result Date: 11/25/2021 CLINICAL DATA:  Abdominal pain, bloating x1 week, constipation EXAM: CT ABDOMEN AND PELVIS WITH CONTRAST TECHNIQUE: Multidetector CT imaging of the abdomen and pelvis was performed using the standard  protocol following bolus administration of intravenous contrast. CONTRAST:  172m OMNIPAQUE IOHEXOL 300 MG/ML  SOLN COMPARISON:  PET-CT dated 04/02/2021. CT pelvis dated 03/15/2021. CT abdomen dated 01/25/2021. FINDINGS: Lower chest: Scarring in the lung bases. Hepatobiliary: Liver is within normal limits. Moderate intrahepatic and extrahepatic ductal dilatation. Common duct measures 19 mm, similar to the prior. Associated soft tissue in the distal CBD, measuring up to 21 mm (series 5/image 70), likely reflecting choledocholithiasis. Pancreas: Dilated main pancreatic duct measuring up to 13 mm at the ampulla (series 2/image 42). 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 Mild diffuse parenchymal atrophy. Spleen: Within normal limits. Adrenals/Urinary Tract: Adrenal glands are within normal limits. Kidneys are within normal limits.  No hydronephrosis. Bladder is within normal limits. Stomach/Bowel: Stomach is notable for a tiny hiatal hernia. Duodenal diverticulum (series 2/image 40). No evidence of bowel obstruction. Appendix is not discretely visualized. Sigmoid diverticulosis, without evidence of diverticulitis. Vascular/Lymphatic: No evidence of abdominal aortic aneurysm. Atherosclerotic calcifications of the abdominal aorta and branch vessels. No suspicious abdominopelvic lymphadenopathy. Reproductive: Status post hysterectomy. No adnexal masses. Other: No abdominopelvic ascites. Musculoskeletal: Degenerative changes of the visualized thoracolumbar spine. Left hip arthroplasty, without evidence of complication. Associated streak artifact. 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 Associated pancreatic ductal dilatation and mild diffuse parenchymal atrophy. Moderate  intrahepatic and extrahepatic ductal dilatation with suspected choledocholithiasis in the distal common duct measuring up to 2.1 cm. No evidence of bowel obstruction. Sigmoid diverticulosis, without evidence of diverticulitis. Electronically Signed   By: SJulian HyM.D.   On: 11/25/2021 20:07   DG Chest Portable 1 View  Result Date: 11/25/2021 CLINICAL DATA:  Hypoxemia. EXAM: PORTABLE CHEST 1 VIEW COMPARISON:  Chest CT dated 03/15/2021. FINDINGS: There is diffuse interstitial prominence with Kerley B-lines consistent with edema. Minimal left lung base atelectasis. No consolidative changes. There is no pleural effusion pneumothorax. The cardiac silhouette is within limits. Atherosclerotic calcification of the aorta. Degenerative changes of the spine. No acute osseous pathology. Thoracic spine stimulator. IMPRESSION: Interstitial pulmonary edema. Electronically Signed   By: ALaren EvertsD.  On: 11/25/2021 20:52   US Abdomen Limited RUQ (LIVER/GB)  Result Date: 11/25/2021 CLINICAL DATA:  Abdominal pain. EXAM: ULTRASOUND ABDOMEN LIMITED RIGHT UPPER QUADRANT COMPARISON:  None. FINDINGS: Gallbladder: Cholecystectomy. Common bile duct: Diameter: 13 mm. The common bile duct is dilated, likely post cholecystectomy. Liver: There is diffuse increased liver echogenicity most commonly seen in the setting of fatty infiltration. Superimposed inflammation or fibrosis is not excluded. Clinical correlation is recommended. Portal vein is patent on color Doppler imaging with normal direction of blood flow towards the liver. Other: None. IMPRESSION: 1. Fatty liver. 2. Cholecystectomy. Electronically Signed   By: Anner Crete M.D.   On: 11/25/2021 20:55     Impression     Choledocholithiasis  WBC 18.5 HGB 10.4 MCV 88.3 Platelets 142 AST 529 (501) ALT 471(236) Alkphos 133(158) TBili 3.2 (1.7) Lactic acid 2.0 CT suspect for choledocholithiasis Patient with previous cholecystectomy, ERCP 06/2019 with  sphincterotomy at that time. ERCP 02/2021 showed patent sphincterotomy. Now with abdominal pain, chills, leukocytosis, elevated AST/ALT, alk phos and total bili   Squamous esophageal cancer completed Xeloda and radiation on 09/04/2021 Due for repeat EGD Dec 2022 or Jan 2023   Leukocytosis Elevated lactic acid, trending down Patient currently on Zosyn   History of pulmonary embolus Last dose of Xarleto 12/10   main duct IPMN Normal lipase at 21    Plan   -Will add on INR for possible SIRS criteria -Will repeat CBC and CMET in the morning  -Continue supportive care -Continue pain control, patient has not responded well to morphine and fentanyl in the past.  Has done well with Demerol per daughter so, who is a Marine scientist. -Continue IV hydration, can do clear liquids today, n.p.o. at midnight. -Will discuss with Dr. Ardis Hughs about potentially doing endoscopy with ERCP as patient is due this December after completing radiation and chemotherapy for esophageal mass. -I thoroughly discussed procedure with the patient to include nature, alternatives, benefits, and risks (including but not limited to post ERCP pancreatitis, bleeding, infection, perforation, anesthesia/cardiac pulmonary complications).  Patient verbalized understanding and gave verbal consent to proceed with ERCP.  Thank you for your kind consultation, we will continue to follow.  Vladimir Crofts  11/26/2021, 11:59 AM   ________________________________________________________________________  Velora Heckler GI MD note:  I personally examined the patient, reviewed the data and agree with the assessment and plan described above.  She has recurrent CBD stones on CT.  I am planning ERCP tomorrow and will plan to get a good look at her esophagus as well.  Liquids for now.    Owens Loffler, MD [facility] Gastroenterology Pager [phone]

## 2021-11-26 NOTE — Progress Notes (Signed)
Inpatient Diabetes Program Recommendations  AACE/ADA: New Consensus Statement on Inpatient Glycemic Control (2015)  Target Ranges:  Prepandial:   less than 140 mg/dL      Peak postprandial:   less than 180 mg/dL (1-2 hours)      Critically ill patients:  140 - 180 mg/dL   Lab Results  Component Value Date   GLUCAP 302 (H) 11/25/2021   HGBA1C 8.7 (H) 08/06/2021    Review of Glycemic Control  Latest Reference Range & Units 11/25/21 21:49  Glucose-Capillary 70 - 99 mg/dL 302 (H)  (H): Data is abnormally high  Diabetes history: DM2 Outpatient Diabetes medications: Amaryl 2 mg QD with supper Current orders for Inpatient glycemic control: N/A  Inpatient Diabetes Program Recommendations:    Consider Glycemic Control order set:  Novolog 0-9 units TID  Will continue to follow while inpatient.  Thank you, Reche Dixon, RN, BSN Diabetes Coordinator Inpatient Diabetes Program [phone] (team pager from 8a-5p)

## 2021-11-27 ENCOUNTER — Other Ambulatory Visit: Payer: Self-pay | Admitting: Family Medicine

## 2021-11-27 ENCOUNTER — Encounter (HOSPITAL_COMMUNITY)
Admission: EM | Disposition: A | Discharge status: Skilled Nursing Facility | Payer: Self-pay | Source: Home / Self Care | Attending: Internal Medicine

## 2021-11-27 ENCOUNTER — Inpatient Hospital Stay (HOSPITAL_COMMUNITY): Payer: Medicare Other | Admitting: Certified Registered Nurse Anesthetist

## 2021-11-27 ENCOUNTER — Encounter (HOSPITAL_COMMUNITY): Payer: Self-pay | Admitting: Internal Medicine

## 2021-11-27 ENCOUNTER — Inpatient Hospital Stay (HOSPITAL_COMMUNITY): Payer: Medicare Other

## 2021-11-27 DIAGNOSIS — E872 Acidosis, unspecified: Secondary | ICD-10-CM

## 2021-11-27 DIAGNOSIS — E876 Hypokalemia: Secondary | ICD-10-CM

## 2021-11-27 DIAGNOSIS — K8309 Other cholangitis: Secondary | ICD-10-CM

## 2021-11-27 DIAGNOSIS — R6520 Severe sepsis without septic shock: Secondary | ICD-10-CM

## 2021-11-27 DIAGNOSIS — Z86711 Personal history of pulmonary embolism: Secondary | ICD-10-CM

## 2021-11-27 DIAGNOSIS — E1165 Type 2 diabetes mellitus with hyperglycemia: Secondary | ICD-10-CM

## 2021-11-27 DIAGNOSIS — I10 Essential (primary) hypertension: Secondary | ICD-10-CM

## 2021-11-27 DIAGNOSIS — R748 Abnormal levels of other serum enzymes: Secondary | ICD-10-CM

## 2021-11-27 DIAGNOSIS — C154 Malignant neoplasm of middle third of esophagus: Secondary | ICD-10-CM

## 2021-11-27 DIAGNOSIS — A419 Sepsis, unspecified organism: Principal | ICD-10-CM

## 2021-11-27 DIAGNOSIS — G894 Chronic pain syndrome: Secondary | ICD-10-CM

## 2021-11-27 HISTORY — PX: ESOPHAGOGASTRODUODENOSCOPY: SHX5428

## 2021-11-27 HISTORY — PX: ENDOSCOPIC RETROGRADE CHOLANGIOPANCREATOGRAPHY (ERCP) WITH PROPOFOL: SHX5810

## 2021-11-27 HISTORY — PX: BIOPSY: SHX5522

## 2021-11-27 HISTORY — PX: BILIARY DILATION: SHX6850

## 2021-11-27 HISTORY — PX: REMOVAL OF STONES: SHX5545

## 2021-11-27 HISTORY — PX: BILIARY STENT PLACEMENT: SHX5538

## 2021-11-27 LAB — COMPREHENSIVE METABOLIC PANEL
ALT: 332 U/L — ABNORMAL HIGH (ref 0–44)
AST: 144 U/L — ABNORMAL HIGH (ref 15–41)
Albumin: 2.6 g/dL — ABNORMAL LOW (ref 3.5–5.0)
Alkaline Phosphatase: 109 U/L (ref 38–126)
Anion gap: 10 (ref 5–15)
BUN: 19 mg/dL (ref 8–23)
CO2: 19 mmol/L — ABNORMAL LOW (ref 22–32)
Calcium: 9.1 mg/dL (ref 8.9–10.3)
Chloride: 104 mmol/L (ref 98–111)
Creatinine, Ser: 0.92 mg/dL (ref 0.44–1.00)
GFR, Estimated: 59 mL/min — ABNORMAL LOW (ref >60)
Glucose, Bld: 204 mg/dL — ABNORMAL HIGH (ref 70–99)
Potassium: 3.4 mmol/L — ABNORMAL LOW (ref 3.5–5.1)
Sodium: 133 mmol/L — ABNORMAL LOW (ref 135–145)
Total Bilirubin: 3.8 mg/dL — ABNORMAL HIGH (ref 0.3–1.2)
Total Protein: 5.4 g/dL — ABNORMAL LOW (ref 6.5–8.1)

## 2021-11-27 LAB — CBC WITH DIFFERENTIAL/PLATELET
Abs Immature Granulocytes: 0.34 10*3/uL — ABNORMAL HIGH (ref 0.00–0.07)
Abs Immature Granulocytes: 1.4 10*3/uL — ABNORMAL HIGH (ref 0.00–0.07)
Basophils Absolute: 0.1 10*3/uL (ref 0.0–0.1)
Basophils Absolute: 0 10*3/uL (ref 0.0–0.1)
Basophils Relative: 0 %
Basophils Relative: 0 %
Eosinophils Absolute: 0.3 10*3/uL (ref 0.0–0.5)
Eosinophils Absolute: 0.1 10*3/uL (ref 0.0–0.5)
Eosinophils Relative: 2 %
Eosinophils Relative: 1 %
HCT: 30.3 % — ABNORMAL LOW (ref 36.0–46.0)
HCT: 31.8 % — ABNORMAL LOW (ref 36.0–46.0)
Hemoglobin: 10.4 g/dL — ABNORMAL LOW (ref 12.0–15.0)
Hemoglobin: 10.9 g/dL — ABNORMAL LOW (ref 12.0–15.0)
Immature Granulocytes: 2 %
Immature Granulocytes: 7 %
Lymphocytes Relative: 2 %
Lymphocytes Relative: 2 %
Lymphs Abs: 0.3 10*3/uL — ABNORMAL LOW (ref 0.7–4.0)
Lymphs Abs: 0.5 10*3/uL — ABNORMAL LOW (ref 0.7–4.0)
MCH: 30.3 pg (ref 26.0–34.0)
MCH: 30.4 pg (ref 26.0–34.0)
MCHC: 34.3 g/dL (ref 30.0–36.0)
MCHC: 34.3 g/dL (ref 30.0–36.0)
MCV: 88.3 fL (ref 80.0–100.0)
MCV: 88.6 fL (ref 80.0–100.0)
Monocytes Absolute: 0.4 10*3/uL (ref 0.1–1.0)
Monocytes Absolute: 0.6 10*3/uL (ref 0.1–1.0)
Monocytes Relative: 2 %
Monocytes Relative: 3 %
Neutro Abs: 17.1 10*3/uL — ABNORMAL HIGH (ref 1.7–7.7)
Neutro Abs: 16.4 10*3/uL — ABNORMAL HIGH (ref 1.7–7.7)
Neutrophils Relative %: 92 %
Neutrophils Relative %: 87 %
Platelets: 142 10*3/uL — ABNORMAL LOW (ref 150–400)
Platelets: 108 10*3/uL — ABNORMAL LOW (ref 150–400)
RBC: 3.43 MIL/uL — ABNORMAL LOW (ref 3.87–5.11)
RBC: 3.59 MIL/uL — ABNORMAL LOW (ref 3.87–5.11)
RDW: 13.1 % (ref 11.5–15.5)
RDW: 13 % (ref 11.5–15.5)
WBC Morphology: INCREASED
WBC: 18.5 10*3/uL — ABNORMAL HIGH (ref 4.0–10.5)
WBC: 19 10*3/uL — ABNORMAL HIGH (ref 4.0–10.5)
nRBC: 0 % (ref 0.0–0.2)
nRBC: 0 % (ref 0.0–0.2)

## 2021-11-27 LAB — HEPARIN LEVEL (UNFRACTIONATED): Heparin Unfractionated: 0.44 IU/mL (ref 0.30–0.70)

## 2021-11-27 LAB — GLUCOSE, CAPILLARY
Glucose-Capillary: 191 mg/dL — ABNORMAL HIGH (ref 70–99)
Glucose-Capillary: 206 mg/dL — ABNORMAL HIGH (ref 70–99)
Glucose-Capillary: 191 mg/dL — ABNORMAL HIGH (ref 70–99)
Glucose-Capillary: 175 mg/dL — ABNORMAL HIGH (ref 70–99)
Glucose-Capillary: 145 mg/dL — ABNORMAL HIGH (ref 70–99)
Glucose-Capillary: 148 mg/dL — ABNORMAL HIGH (ref 70–99)
Glucose-Capillary: 192 mg/dL — ABNORMAL HIGH (ref 70–99)

## 2021-11-27 LAB — LACTIC ACID, PLASMA
Lactic Acid, Venous: 1.8 mmol/L (ref 0.5–1.9)
Lactic Acid, Venous: 1.2 mmol/L (ref 0.5–1.9)

## 2021-11-27 LAB — MAGNESIUM: Magnesium: 1.5 mg/dL — ABNORMAL LOW (ref 1.7–2.4)

## 2021-11-27 LAB — APTT: aPTT: 76 seconds — ABNORMAL HIGH (ref 24–36)

## 2021-11-27 SURGERY — ENDOSCOPIC RETROGRADE CHOLANGIOPANCREATOGRAPHY (ERCP) WITH PROPOFOL
Anesthesia: General

## 2021-11-27 MED ORDER — PHENYLEPHRINE 40 MCG/ML (10ML) SYRINGE FOR IV PUSH (FOR BLOOD PRESSURE SUPPORT)
PREFILLED_SYRINGE | INTRAVENOUS | Status: DC | PRN
  Administered 2021-11-27: 80 ug via INTRAVENOUS
  Administered 2021-11-27: 120 ug via INTRAVENOUS
  Administered 2021-11-27: 80 ug via INTRAVENOUS

## 2021-11-27 MED ORDER — GLUCAGON HCL RDNA (DIAGNOSTIC) 1 MG IJ SOLR
INTRAMUSCULAR | Status: AC
  Filled 2021-11-27: qty 1

## 2021-11-27 MED ORDER — PIPERACILLIN-TAZOBACTAM 3.375 G IVPB
3.3750 g | Freq: Three times a day (TID) | INTRAVENOUS | Status: DC
  Administered 2021-11-27 – 2021-11-28 (×3): 3.375 g via INTRAVENOUS
  Filled 2021-11-27 (×4): qty 50

## 2021-11-27 MED ORDER — POTASSIUM CHLORIDE 10 MEQ/100ML IV SOLN
10.0000 meq | INTRAVENOUS | Status: AC
  Administered 2021-11-27 (×3): 10 meq via INTRAVENOUS
  Filled 2021-11-27 (×3): qty 100

## 2021-11-27 MED ORDER — PIPERACILLIN-TAZOBACTAM 3.375 G IVPB 30 MIN
3.3750 g | Freq: Once | INTRAVENOUS | Status: AC
  Administered 2021-11-27: 3.375 g via INTRAVENOUS
  Filled 2021-11-27 (×2): qty 50

## 2021-11-27 MED ORDER — SUGAMMADEX SODIUM 200 MG/2ML IV SOLN
INTRAVENOUS | Status: DC | PRN
  Administered 2021-11-27: 200 mg via INTRAVENOUS

## 2021-11-27 MED ORDER — SODIUM CHLORIDE 0.9 % IV SOLN: INTRAVENOUS | Status: DC

## 2021-11-27 MED ORDER — SODIUM CHLORIDE 0.9 % IV SOLN: INTRAVENOUS | Status: DC | PRN

## 2021-11-27 MED ORDER — INDOMETHACIN 50 MG RE SUPP
RECTAL | Status: AC
  Filled 2021-11-27: qty 2

## 2021-11-27 MED ORDER — HEPARIN (PORCINE) 25000 UT/250ML-% IV SOLN
1300.0000 [IU]/h | INTRAVENOUS | Status: DC
  Administered 2021-11-27: 1100 [IU]/h via INTRAVENOUS
  Filled 2021-11-27: qty 250

## 2021-11-27 MED ORDER — ESMOLOL HCL 100 MG/10ML IV SOLN
INTRAVENOUS | Status: DC | PRN
  Administered 2021-11-27: 20 mg via INTRAVENOUS

## 2021-11-27 MED ORDER — DEXAMETHASONE SODIUM PHOSPHATE 10 MG/ML IJ SOLN
INTRAMUSCULAR | Status: DC | PRN
  Administered 2021-11-27: 5 mg via INTRAVENOUS

## 2021-11-27 MED ORDER — PHENYLEPHRINE HCL-NACL 20-0.9 MG/250ML-% IV SOLN
INTRAVENOUS | Status: DC | PRN
  Administered 2021-11-27: 25 ug/min via INTRAVENOUS

## 2021-11-27 MED ORDER — FENTANYL CITRATE (PF) 100 MCG/2ML IJ SOLN
INTRAMUSCULAR | Status: DC | PRN
  Administered 2021-11-27: 50 ug via INTRAVENOUS

## 2021-11-27 MED ORDER — ROCURONIUM BROMIDE 10 MG/ML (PF) SYRINGE
PREFILLED_SYRINGE | INTRAVENOUS | Status: DC | PRN
  Administered 2021-11-27: 50 mg via INTRAVENOUS

## 2021-11-27 MED ORDER — PROPOFOL 10 MG/ML IV BOLUS
INTRAVENOUS | Status: DC | PRN
  Administered 2021-11-27: 70 mg via INTRAVENOUS

## 2021-11-27 MED ORDER — MAGNESIUM SULFATE 2 GM/50ML IV SOLN
2.0000 g | Freq: Once | INTRAVENOUS | Status: AC
  Administered 2021-11-27: 2 g via INTRAVENOUS
  Filled 2021-11-27: qty 50

## 2021-11-27 MED ORDER — HEPARIN (PORCINE) 25000 UT/250ML-% IV SOLN
1050.0000 [IU]/h | INTRAVENOUS | Status: DC
  Filled 2021-11-27: qty 250

## 2021-11-27 MED ORDER — ONDANSETRON HCL 4 MG/2ML IJ SOLN
INTRAMUSCULAR | Status: DC | PRN
  Administered 2021-11-27: 4 mg via INTRAVENOUS

## 2021-11-27 MED ORDER — LIDOCAINE 2% (20 MG/ML) 5 ML SYRINGE
INTRAMUSCULAR | Status: DC | PRN
  Administered 2021-11-27: 60 mg via INTRAVENOUS

## 2021-11-27 NOTE — Anesthesia Preprocedure Evaluation (Addendum)
Anesthesia Evaluation  Patient identified by MRN, date of birth, ID band Patient awake    Reviewed: Allergy & Precautions, NPO status , Patient's Chart, lab work & pertinent test results  Airway Mallampati: II  TM Distance: >3 FB Neck ROM: Full    Dental  (+) Dental Advisory Given, Edentulous Lower, Edentulous Upper   Pulmonary sleep apnea , former smoker,    Pulmonary exam normal breath sounds clear to auscultation       Cardiovascular hypertension, Pt. on medications + Peripheral Vascular Disease  Normal cardiovascular exam Rhythm:Regular Rate:Normal  Echo 03/17/21: 1. Left ventricular ejection fraction, by estimation, is 60 to 65%. The left ventricle has normal function. The left ventricle has no regional wall motion abnormalities. There is mild left ventricular hypertrophy. Left ventricular diastolic parameters are consistent with Grade I diastolic dysfunction (impaired relaxation).  2. Right ventricular systolic function is normal. The right ventricular size is normal.  3. The mitral valve is normal in structure. No evidence of mitral valve regurgitation. No evidence of mitral stenosis.  4. The aortic valve is tricuspid. Aortic valve regurgitation is not visualized. Mild aortic valve sclerosis is present, with no evidence of aortic valve stenosis.  5. The inferior vena cava is normal in size with greater than 50% respiratory variability, suggesting right atrial pressure of 3 mmHg.   Neuro/Psych  Neuromuscular disease    GI/Hepatic GERD  Medicated,Choledocholithiasis   Endo/Other  diabetes, Type 2, Oral Hypoglycemic Agents  Renal/GU negative Renal ROS Bladder dysfunction      Musculoskeletal  (+) Arthritis ,   Abdominal   Peds  Hematology  (+) Blood dyscrasia (Xarelto), ,   Anesthesia Other Findings Day of surgery medications reviewed with the patient.  Reproductive/Obstetrics                             Anesthesia Physical Anesthesia Plan  ASA: 3  Anesthesia Plan: General   Post-op Pain Management:    Induction: Intravenous  PONV Risk Score and Plan: 3 and Dexamethasone and Ondansetron  Airway Management Planned: Oral ETT  Additional Equipment:   Intra-op Plan:   Post-operative Plan: Extubation in OR  Informed Consent: I have reviewed the patients History and Physical, chart, labs and discussed the procedure including the risks, benefits and alternatives for the proposed anesthesia with the patient or authorized representative who has indicated his/her understanding and acceptance.   Patient has DNR.  Discussed DNR with patient and Continue DNR.   Dental advisory given  Plan Discussed with: CRNA  Anesthesia Plan Comments:        Anesthesia Quick Evaluation

## 2021-11-27 NOTE — Op Note (Signed)
[facility] Patient Name: Sherry Holloway Procedure Date : 11/27/2021 MRN: [id number] Attending MD: [name] , MD Date of Birth: 25-Nov-1928 CSN: [id number] Age: 87 Admit Type: Inpatient Procedure:                ERCP Indications:              bile duct stones, recurrent. 3 ERCPs for this over                            past 2-3 years ([place] and then locally with Dr.                            Rush Landmark). 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 Estimated blood loss:                            None Estimated Blood Loss:     Estimated blood loss: none. Procedure:                Pre-Anesthesia Assessment:                           - Prior to the procedure, a History and Physical                            was performed, and patient medications and                            allergies were reviewed. The patient's tolerance of                            previous anesthesia was also reviewed. The risks                            and benefits of the procedure and the sedation  options and risks were discussed with the patient.                            All questions were answered, and informed consent                            was obtained. Prior Anticoagulants: The patient has                            taken no previous anticoagulant or antiplatelet                            agents. ASA Grade Assessment: III - A patient with                            severe systemic disease. After  reviewing the risks                            and benefits, the patient was deemed in                            satisfactory condition to undergo the procedure.                           After obtaining informed consent, the scope was                            passed under direct vision. Throughout the                            procedure, the patient's blood pressure, pulse, and                            oxygen saturations were monitored continuously. The                            [id number]-[id number] ([id number]) Olympus duodenoscope was                            introduced through the mouth, and used to inject                            contrast into and used to cannulate the bile duct.                            The ERCP was accomplished without difficulty. The                            patient tolerated the procedure well. Scope In: Scope Out: Findings:      Scout film showed clips in the right upper quadrant. The duodenoscope       was advanced to the region of the major papilla which was very abnormal.       There was a medium sized  diverticulum leftward on endoscopic view. There       was a very large fishmouth pancreatic duct orifice with significant       mucus drainage on the right endoscopically. The bile duct orifice was on       a small isthmus of tissue between the fishmouth pancreatic duct orifice       and the diverticulum. There was evidence of previous biliary       sphincterotomy. I used a 44 autotome over 0.035 Hydra wire to cannulate       the bile duct and injected contrast. Cholangiogram showed a diffusely       dilated main and common bile duct, diameter of CBD was 12 to 13 mm. This       contained several amorphous filling defects. I used a 12 to 15 mm       biliary retrieval balloon to sweep the duct numerous times delivering       stone fragments and copious stone debris and bio-debris into the       duodenum. I did not feel that the sphincterotomy site was allowing  full       enough retrieval and so I elected to dilate the site it with a 10 mm       HurriCaine dilating balloon and then swept the duct several more times       with the biliary balloon, delivering even more stone debris into the       duodenum. I became suspicious of a possible impacted stone debris       distally in the bile duct and I decided to place a 10 French 5 cm long       plastic biliary stent across the biliary orifice. The distal most 1 cm       of the stent extended into the duodenum.      The edges of the fishmouth main pancreatic duct orifice were a bit ratty       appearing and I biopsied this with forceps.      Following the ERCP I passed a standard adult gastroscope to examine her       esophagus given her previously known squamous cell mid esophageal cancer       and I could not detect any mucosal abnormalities in the esophagus. The       stomach however had a mottled appearance along the greater curvature. Impression:               Previously known mid esophagus cancer is                            imperceptable now.                           Choledocholithiasis, treated with balloon dilation                            of previous sphincterotomy, balloon sweeping and                            eventual placement of 10Fr 5cm long plastic biliary                            stent.  Large fishmouth pancreatic duct orifice extruding                            copious mucous, typical appearing for main duct                            IPMN. I used forceps to biopsy the rather ratty                            appearing PD orifice rim. Recommendation:           - Observe in hospital another 24-48 hours, trend                            LFTs, OK to change to oral antibiotics tomorrow                            pending clinical course                           - Repeat ERCP in 6-8 weeks as outpatient. Procedure Code(s):        --- Professional ---                            [phone], Endoscopic retrograde                            cholangiopancreatography (ERCP); with placement of                            endoscopic stent into biliary or pancreatic duct,                            including pre- and post-dilation and guide wire                            passage, when performed, including sphincterotomy,                            when performed, each stent Diagnosis Code(s):        --- Professional ---                           K80.50, Calculus of bile duct without cholangitis                            or cholecystitis without obstruction CPT copyright 2019 American Medical Association. All rights reserved. The codes documented in this report are preliminary and upon coder review may  be revised to meet current compliance requirements. [name], MD 11/27/2021 3:20:37 PM This report has been signed electronically. Number of Addenda: 0

## 2021-11-27 NOTE — Interval H&P Note (Signed)
History and Physical Interval Note:  11/27/2021 1:21 PM  Sherry Holloway  has presented today for surgery, with the diagnosis of choledocholithiasis.  The various methods of treatment have been discussed with the patient and family. After consideration of risks, benefits and other options for treatment, the patient has consented to  Procedure(s) with comments: ENDOSCOPIC RETROGRADE CHOLANGIOPANCREATOGRAPHY (ERCP) WITH PROPOFOL (N/A) - possible EGD prior as a surgical intervention.  The patient's history has been reviewed, patient examined, no change in status, stable for surgery.  I have reviewed the patient's chart and labs.  Questions were answered to the patient's satisfaction.     [name]

## 2021-11-27 NOTE — Transfer of Care (Signed)
Immediate Anesthesia Transfer of Care Note  Patient: Sherry Holloway  Procedure(s) Performed: ENDOSCOPIC RETROGRADE CHOLANGIOPANCREATOGRAPHY (ERCP) WITH PROPOFOL BILIARY DILATION BIOPSY REMOVAL OF STONES BILIARY STENT PLACEMENT ESOPHAGOGASTRODUODENOSCOPY (EGD)  Patient Location: Endoscopy Unit  Anesthesia Type:General  Level of Consciousness: awake and drowsy  Airway & Oxygen Therapy: Patient Spontanous Breathing  Post-op Assessment: Report given to RN and Post -op Vital signs reviewed and stable  Post vital signs: Reviewed and stable  Last Vitals:  Vitals Value Taken Time  BP 147/38 11/27/21 1515  Temp 36.1 C 11/27/21 1515  Pulse 65 11/27/21 1518  Resp 23 11/27/21 1518  SpO2 92 % 11/27/21 1518  Vitals shown include unvalidated device data.  Last Pain:  Vitals:   11/27/21 1515  TempSrc: Temporal  PainSc:       Patients Stated Pain Goal: 0 (99/24/26 8341)  Complications: No notable events documented.

## 2021-11-27 NOTE — Progress Notes (Signed)
ANTICOAGULATION CONSULT NOTE - Initial Consult  Pharmacy Consult for heparin Indication: 03/16/21 pulmonary embolus  Allergies  Allergen Reactions   Gramicidin Other (See Comments)    unknown   Sulfa Antibiotics Rash   Wound Dressing Adhesive Rash    Patient Measurements: Height: 5\' 4"  (162.6 cm) Weight: 74.4 kg (164 lb) IBW/kg (Calculated) : 54.7 Heparin Dosing Weight: 70kg  Vital Signs: Temp: 97.9 F (36.6 C) (12/13 0833) Temp Source: Oral (12/13 0833) BP: 168/50 (12/13 0833) Pulse Rate: 74 (12/13 0833)  Labs: Recent Labs    11/25/21 1832 11/25/21 2015 11/26/21 0106 11/26/21 0623 11/26/21 0942 11/26/21 1443 11/27/21 0155  HGB 12.2  --   --  10.4*  --   --  10.9*  HCT 36.4  --   --  30.3*  --   --  31.8*  PLT 197  --   --  142*  --   --  108*  APTT  --   --   --   --   --   --  76*  LABPROT  --   --   --   --   --  15.9*  --   INR  --   --   --   --   --  1.3*  --   HEPARINUNFRC  --   --   --   --   --   --  0.44  CREATININE 0.85  --   --  0.79  --   --  0.92  TROPONINIHS 11   < > 43* 66* 56*  --   --    < > = values in this interval not displayed.     Estimated Creatinine Clearance: 39.4 mL/min (by C-G formula based on SCr of 0.92 mg/dL).   Medical History: Past Medical History:  Diagnosis Date   Arthritis    Choledocholithiasis    Diabetes mellitus without complication ([place])    Diverticulitis    Esophageal cancer ([place]) dx'd 2020   GERD (gastroesophageal reflux disease)    Hyperlipidemia    Hypertension    OSA (obstructive sleep apnea)    Urine incontinence    Uterine cancer [facility])       Assessment: 87 yo W on rivaroxaban PTA for 4/122 segmental right sided PE in the setting of esophageal and uterine cancer. Held rivaroxaban for possible procedure. Last dose 12/10 PM. Pharmacy consulted for heparin.   Held heparin @ 9:30 for ERCP at 1400.   Goal of Therapy:  Heparin level 0.3-0.7 units/ml aPTT 66-102 seconds Monitor platelets by  anticoagulation protocol: Yes   Plan:  F/u restart heparin post ERCP F/u aPTT until correlates with heparin level  F/u 8 hr aPTT  Monitor daily aPTT HL, CBC/plt Monitor for signs/symptoms of bleeding    Benetta Spar, PharmD, BCPS, BCCP Clinical Pharmacist  Please check AMION for all [facility] phone numbers After 10:00 PM, call [place]

## 2021-11-27 NOTE — Progress Notes (Signed)
  Transition of Care [facility]) Screening Note   Patient Details  Name: Sherry Holloway Date of Birth: 1928-11-27   Transition of Care [facility]) CM/SW Contact:    Pollie Friar, RN Phone Number: 11/27/2021, 9:21 AM    Transition of Care Department [facility]) has reviewed patient. We will continue to monitor patient advancement through interdisciplinary progression rounds. If new patient transition needs arise, please place a TOC consult.

## 2021-11-27 NOTE — Progress Notes (Signed)
PROGRESS NOTE  Sherry Holloway GGE:[id number] DOB: 1928-06-15   PCP: Ann Held, DO  Patient is from: Home  DOA: 11/25/2021 LOS: 1  Chief complaints:  Chief Complaint  Patient presents with   Abdominal Pain     Brief Narrative / Interim history: 87 year old F with PMH of esophageal cancer s/p radiation and Xeloda, uterine cancer, PE on Xarelto, DM-2, OSA, HTN, HLD, choledocholithiasis s/p ERCP and cholecystectomy presenting with RUQ and epigastric abdominal pain, distention and admitted with recurrent choledocholithiasis measuring about 2.1 cm in the distal CBD as noted on CT abdomen and pelvis.  RUQ ultrasound with CBD to 13 mm.  She received IV Zosyn in ED.  Had leukocytosis to 18 with left shift and lactic acidosis to 3.4.  AST 529.  ALT 471.  Total bili 3.2.  Alk phos 133.  Received IV Zosyn in ED. GI consulted, and planning ERCP on 12/13.  Subjective: Seen and examined earlier this morning.  Patient's daughter at bedside.  Reports improvement in her pain.  She rates her pain 2/10 this morning.  She is awake but not quite alert from pain medication.  Denies chest pain, dyspnea, nausea, vomiting or UTI symptoms.  Objective: Vitals:   11/26/21 2108 11/27/21 0112 11/27/21 0435 11/27/21 0833  BP: (!) 179/58 (!) 144/59 (!) 151/65 (!) 168/50  Pulse: 92 89 76 74  Resp: '18 18 17 17  ' Temp: 99.7 F (37.6 C) 98.6 F (37 C) 98.6 F (37 C) 97.9 F (36.6 C)  TempSrc:    Oral  SpO2: 93% 96% 97% 98%  Weight:      Height:        Intake/Output Summary (Last 24 hours) at 11/27/2021 1259 Last data filed at 11/27/2021 1212 Gross per 24 hour  Intake 1577.52 ml  Output 1100 ml  Net 477.52 ml     Examination:  GENERAL: No apparent distress.  Nontoxic. HEENT: MMM.  Vision and hearing grossly intact.  NECK: Supple.  No apparent JVD.  RESP:  No IWOB.  Fair aeration bilaterally. CVS:  RRR. Heart sounds normal.  ABD/GI/GU: BS+. Abd soft.  Tender over RUQ and epigastric area.   Seems to be guarding MSK/EXT:  Moves extremities. No apparent deformity. No edema.  SKIN: no apparent skin lesion or wound NEURO: Awake but not quite alert.  Oriented appropriately.  No apparent focal neuro deficit. PSYCH: Calm. Normal affect.   Procedures:  None  Microbiology summarized: [id number] and influenza PCR nonreactive.  Assessment & Plan: Severe sepsis due to possible cholangitis with choledocholithiasis: POA.  Patient with neutrophilia, tachypnea and lactic acidosis on presentation.  Lipase within normal.  Received IV Zosyn while in ED. -Continue IV Zosyn pending ERCP -Pain control, antiemetics, IV fluid  Elevated liver enzymes/hyperbilirubinemia: Likely due to the above.  Improved except total bili.  She might have passed stone Recent Labs  Lab 11/25/21 1832 11/26/21 0623 11/27/21 0155  AST 501* 529* 144*  ALT 236* 471* 332*  ALKPHOS 158* 133* 109  BILITOT 1.7* 3.2* 3.8*  PROT 6.8 5.5* 5.4*  ALBUMIN 3.7 2.9* 2.6*  -Management as above -Continue monitoring  Esophageal cancer-biopsy with invasive malignancy. S/p radiation and Xeloda.  Due for surveillance EGD -ERCP today  History of pulmonary embolism: Incidental finding when she was diagnosed with esophageal cancer in 02/2021.  On Xarelto at home. -Bridged with IV heparin pending ERCP   Uncontrolled NIDDM-2 with hyperglycemia: A1c 8.7% in 07/2021.  On Amaryl at home. Recent Labs  Lab 11/26/21 2103 11/27/21  0113 11/27/21 0433 11/27/21 0834 11/27/21 1207  GLUCAP 227* 191* 206* 191* 175*  -Continue SSI while in-house -Check hemoglobin A1c  Hypomagnesemia/hypokalemia: K3.4.  Mg 1.5. -IV KCl 10x4 -IV magnesium sulfate 2 g x 1  Lactic acidosis/metabolic acidosis -Repeat lactic acid -Continue IV fluid  Elevated troponin: Likely demand ischemia from #1.  No chest pain or cardiopulmonary symptoms. -No indication for further cardiac evaluation.  Normocytic anemia: Relatively stable. Recent Labs     03/17/21 0001 03/18/21 0332 04/20/21 1022 07/23/21 1134 08/09/21 1350 08/29/21 1052 10/01/21 1444 11/25/21 1832 11/26/21 0623 11/27/21 0155  HGB 10.9* 11.6* 11.4* 12.3 11.8* 11.8* 11.9* 12.2 10.4* 10.9*  -Continue monitoring  Leukocytosis/bandemia: Likely due to #1. -Antibiotics as above -Continue monitoring   Essential hypertension: BP slightly elevated.  Could be from pain and IV fluid. -Continue valsartan -Continue as needed hydralazine   Glaucoma  -Continue latanoprost   Chronic pain syndrome-narcotic database reviewed. -Continue IV morphine for now -Continue home MS Contin once able to take p.o.   Mood disorder: Stable -Continue mirtazepine    Body mass index is 28.15 kg/m.         DVT prophylaxis:    On IV heparin for history of PE.  Code Status: DNR/DNI Family Communication: Updated patient's daughter at bedside. Level of care: Med-Surg Status is: Inpatient  Remains inpatient appropriate because: Due to severe sepsis with possible cholangitis requiring IV antibiotics, choledocholithiasis requiring ERCP       Consultants:  Gastroenterology   Sch Meds:  Scheduled Meds:  insulin aspart  0-15 Units Subcutaneous Q4H   irbesartan  37.5 mg Oral Daily   latanoprost  1 drop Both Eyes QHS   mouth rinse  15 mL Mouth Rinse BID   mirtazapine  15 mg Oral Q supper   morphine  30 mg Oral Q12H   pantoprazole  40 mg Oral Daily   valACYclovir  1,000 mg Oral Daily   Continuous Infusions:  lactated ringers 75 mL/hr at 11/27/21 0432   magnesium sulfate bolus IVPB     piperacillin-tazobactam (ZOSYN)  IV     PRN Meds:.acetaminophen **OR** acetaminophen, hydrALAZINE, morphine injection, ondansetron **OR** ondansetron (ZOFRAN) IV  Antimicrobials: Anti-infectives (From admission, onward)    Start     Dose/Rate Route Frequency Ordered Stop   11/27/21 1400  piperacillin-tazobactam (ZOSYN) IVPB 3.375 g        3.375 g 12.5 mL/hr over 240 Minutes Intravenous  Every 8 hours 11/27/21 0718     11/27/21 0815  piperacillin-tazobactam (ZOSYN) IVPB 3.375 g        3.375 g 100 mL/hr over 30 Minutes Intravenous  Once 11/27/21 0718 11/27/21 0954   11/26/21 1445  valACYclovir (VALTREX) tablet 1,000 mg        1,000 mg Oral Daily 11/26/21 1357     11/25/21 2030  piperacillin-tazobactam (ZOSYN) IVPB 3.375 g        3.375 g 100 mL/hr over 30 Minutes Intravenous  Once 11/25/21 2027 11/25/21 2127        I have personally reviewed the following labs and images: CBC: Recent Labs  Lab 11/25/21 1832 11/26/21 0623 11/27/21 0155  WBC 9.6 18.5* 19.0*  NEUTROABS 8.6* 17.1* 16.4*  HGB 12.2 10.4* 10.9*  HCT 36.4 30.3* 31.8*  MCV 89.7 88.3 88.6  PLT 197 142* 108*   BMP &GFR Recent Labs  Lab 11/25/21 1832 11/26/21 0623 11/27/21 0155  NA 136 136 133*  K 4.0 3.5 3.4*  CL 104 107 104  CO2 24  19* 19*  GLUCOSE 273* 289* 204*  BUN '19 17 19  ' CREATININE 0.85 0.79 0.92  CALCIUM 9.2 8.2* 9.1  MG  --   --  1.5*   Estimated Creatinine Clearance: 39.4 mL/min (by C-G formula based on SCr of 0.92 mg/dL). Liver & Pancreas: Recent Labs  Lab 11/25/21 1832 11/26/21 0623 11/27/21 0155  AST 501* 529* 144*  ALT 236* 471* 332*  ALKPHOS 158* 133* 109  BILITOT 1.7* 3.2* 3.8*  PROT 6.8 5.5* 5.4*  ALBUMIN 3.7 2.9* 2.6*   Recent Labs  Lab 11/25/21 1832 11/26/21 0623  LIPASE 20 21   No results for input(s): AMMONIA in the last 168 hours. Diabetic: No results for input(s): HGBA1C in the last 72 hours. Recent Labs  Lab 11/26/21 2103 11/27/21 0113 11/27/21 0433 11/27/21 0834 11/27/21 1207  GLUCAP 227* 191* 206* 191* 175*   Cardiac Enzymes: No results for input(s): CKTOTAL, CKMB, CKMBINDEX, TROPONINI in the last 168 hours. No results for input(s): PROBNP in the last 8760 hours. Coagulation Profile: Recent Labs  Lab 11/26/21 1443  INR 1.3*   Thyroid Function Tests: No results for input(s): TSH, T4TOTAL, FREET4, T3FREE, THYROIDAB in the last 72  hours. Lipid Profile: No results for input(s): CHOL, HDL, LDLCALC, TRIG, CHOLHDL, LDLDIRECT in the last 72 hours. Anemia Panel: No results for input(s): VITAMINB12, FOLATE, FERRITIN, TIBC, IRON, RETICCTPCT in the last 72 hours. Urine analysis:    Component Value Date/Time   COLORURINE YELLOW 11/25/2021 [place] 11/25/2021 2310   LABSPEC 1.015 11/25/2021 2310   PHURINE 5.0 11/25/2021 2310   GLUCOSEU >=500 (A) 11/25/2021 2310   HGBUR SMALL (A) 11/25/2021 2310   BILIRUBINUR NEGATIVE 11/25/2021 2310   BILIRUBINUR negative 10/19/2020 1440   KETONESUR NEGATIVE 11/25/2021 2310   PROTEINUR 30 (A) 11/25/2021 2310   UROBILINOGEN 0.2 10/19/2020 1440   NITRITE NEGATIVE 11/25/2021 2310   LEUKOCYTESUR TRACE (A) 11/25/2021 2310   Sepsis Labs: Invalid input(s): PROCALCITONIN, [place]  Microbiology: Recent Results (from the past 240 hour(s))  Resp Panel by RT-PCR (Flu A&B, Covid) Nasopharyngeal Swab     Status: None   Collection Time: 11/25/21  8:15 PM   Specimen: Nasopharyngeal Swab; Nasopharyngeal(NP) swabs in vial transport medium  Result Value Ref Range Status   SARS Coronavirus 2 by RT PCR NEGATIVE NEGATIVE Final    Comment: (NOTE) SARS-CoV-2 target nucleic acids are NOT DETECTED.  The SARS-CoV-2 RNA is generally detectable in upper respiratory specimens during the acute phase of infection. The lowest concentration of SARS-CoV-2 viral copies this assay can detect is 138 copies/mL. A negative result does not preclude SARS-Cov-2 infection and should not be used as the sole basis for treatment or other patient management decisions. A negative result may occur with  improper specimen collection/handling, submission of specimen other than nasopharyngeal swab, presence of viral mutation(s) within the areas targeted by this assay, and inadequate number of viral copies(<138 copies/mL). A negative result must be combined with clinical observations, patient history, and  epidemiological information. The expected result is Negative.  Fact Sheet for Patients:  EntrepreneurPulse.com.au  Fact Sheet for Healthcare Providers:  IncredibleEmployment.be  This test is no t yet approved or cleared by the [place] FDA and  has been authorized for detection and/or diagnosis of SARS-CoV-2 by FDA under an Emergency Use Authorization (EUA). This EUA will remain  in effect (meaning this test can be used) for the duration of the COVID-19 declaration under Section 564(b)(1) of the Act, 21 U.S.C.section 360bbb-3(b)(1), unless  the authorization is terminated  or revoked sooner.       Influenza A by PCR NEGATIVE NEGATIVE Final   Influenza B by PCR NEGATIVE NEGATIVE Final    Comment: (NOTE) The Xpert Xpress SARS-CoV-2/FLU/RSV plus assay is intended as an aid in the diagnosis of influenza from Nasopharyngeal swab specimens and should not be used as a sole basis for treatment. Nasal washings and aspirates are unacceptable for Xpert Xpress SARS-CoV-2/FLU/RSV testing.  Fact Sheet for Patients: EntrepreneurPulse.com.au  Fact Sheet for Healthcare Providers: IncredibleEmployment.be  This test is not yet approved or cleared by the [place] FDA and has been authorized for detection and/or diagnosis of SARS-CoV-2 by FDA under an Emergency Use Authorization (EUA). This EUA will remain in effect (meaning this test can be used) for the duration of the COVID-19 declaration under Section 564(b)(1) of the Act, 21 U.S.C. section 360bbb-3(b)(1), unless the authorization is terminated or revoked.  Performed at [facility], [address], [place], [place] [address]     Radiology Studies: No results found.  55 minutes with more than 50% spent in reviewing records, counseling patient/family and coordinating care.  Braven Wolk T. [place]  If 7PM-7AM, please contact  night-coverage [email or web address] 11/27/2021, 12:59 PM

## 2021-11-27 NOTE — Anesthesia Postprocedure Evaluation (Signed)
Anesthesia Post Note  Patient: Engineer, drilling  Procedure(s) Performed: ENDOSCOPIC RETROGRADE CHOLANGIOPANCREATOGRAPHY (ERCP) WITH PROPOFOL BILIARY DILATION BIOPSY REMOVAL OF STONES BILIARY STENT PLACEMENT ESOPHAGOGASTRODUODENOSCOPY (EGD)     Patient location during evaluation: PACU Anesthesia Type: General Level of consciousness: awake and alert Pain management: pain level controlled Vital Signs Assessment: post-procedure vital signs reviewed and stable Respiratory status: spontaneous breathing, nonlabored ventilation and respiratory function stable Cardiovascular status: blood pressure returned to baseline and stable Postop Assessment: no apparent nausea or vomiting Anesthetic complications: no   No notable events documented.  Last Vitals:  Vitals:   11/27/21 1535 11/27/21 1606  BP: (!) 140/34 (!) 143/47  Pulse: 66 66  Resp: 19 18  Temp:  36.6 C  SpO2: 94% 97%    Last Pain:  Vitals:   11/27/21 1606  TempSrc: Oral  PainSc:                  Catalina Gravel

## 2021-11-27 NOTE — Progress Notes (Signed)
ANTICOAGULATION CONSULT NOTE - Follow Up Consult  Pharmacy Consult for heparin Indication:  h/o PE  Labs: Recent Labs    11/25/21 1832 11/25/21 2015 11/26/21 0106 11/26/21 0623 11/26/21 0942 11/26/21 1443 11/27/21 0155  HGB 12.2  --   --  10.4*  --   --  10.9*  HCT 36.4  --   --  30.3*  --   --  31.8*  PLT 197  --   --  142*  --   --  108*  APTT  --   --   --   --   --   --  76*  LABPROT  --   --   --   --   --  15.9*  --   INR  --   --   --   --   --  1.3*  --   HEPARINUNFRC  --   --   --   --   --   --  0.44  CREATININE 0.85  --   --  0.79  --   --  0.92  TROPONINIHS 11   < > 43* 66* 56*  --   --    < > = values in this interval not displayed.    Assessment/Plan:  87yo female therapeutic on heparin with initial dosing while Xarelto on hold. Will continue infusion at current rate of 1050 units/hr and confirm stable with additional level.   Sherry Holloway, PharmD, BCPS  11/27/2021,3:54 AM

## 2021-11-27 NOTE — Progress Notes (Signed)
Pharmacy Antibiotic Note  Sherry Holloway is a 87 y.o. female  with choledocholithiasis  .  Pharmacy has been consulted for zosyn dosing. -WBC= 19, CrCl ~ 40  Plan: -Zosyn 3.375gm IV q8h -Will follow renal function and clinical progress    Height: 5\' 4"  (162.6 cm) Weight: 74.4 kg (164 lb) IBW/kg (Calculated) : 54.7  Temp (24hrs), Avg:98.5 F (36.9 C), Min:97.8 F (36.6 C), Max:99.7 F (37.6 C)  Recent Labs  Lab 11/25/21 1832 11/25/21 2015 11/26/21 0106 11/26/21 0623 11/27/21 0155  WBC 9.6  --   --  18.5* 19.0*  CREATININE 0.85  --   --  0.79 0.92  LATICACIDVEN 2.0* 2.7* 3.4* 2.6*  --     Estimated Creatinine Clearance: 39.4 mL/min (by C-G formula based on SCr of 0.92 mg/dL).    Allergies  Allergen Reactions   Gramicidin Other (See Comments)    unknown   Sulfa Antibiotics Rash   Wound Dressing Adhesive Rash    Thank you for allowing pharmacy to be a part of this patient's care.  Hildred Laser, PharmD Clinical Pharmacist **Pharmacist phone directory can now be found on [email or web address] ([email or web address]).  Listed under [place].

## 2021-11-27 NOTE — Anesthesia Procedure Notes (Signed)
Procedure Name: Intubation Date/Time: 11/27/2021 1:40 PM Performed by: Colin Benton, CRNA Pre-anesthesia Checklist: Patient identified, Emergency Drugs available, Suction available and Patient being monitored Patient Re-evaluated:Patient Re-evaluated prior to induction Oxygen Delivery Method: Circle system utilized Preoxygenation: Pre-oxygenation with 100% oxygen Induction Type: IV induction Ventilation: Mask ventilation without difficulty Laryngoscope Size: Mac and 3 Grade View: Grade I Tube type: Oral Tube size: 7.0 mm Number of attempts: 1 Airway Equipment and Method: Stylet Placement Confirmation: ETT inserted through vocal cords under direct vision, positive ETCO2 and breath sounds checked- equal and bilateral Secured at: 21 cm Tube secured with: Tape Dental Injury: Teeth and Oropharynx as per pre-operative assessment

## 2021-11-28 ENCOUNTER — Telehealth: Payer: Self-pay

## 2021-11-28 ENCOUNTER — Inpatient Hospital Stay (HOSPITAL_COMMUNITY): Payer: Medicare Other

## 2021-11-28 ENCOUNTER — Other Ambulatory Visit: Payer: Self-pay

## 2021-11-28 DIAGNOSIS — K8689 Other specified diseases of pancreas: Secondary | ICD-10-CM

## 2021-11-28 DIAGNOSIS — K8050 Calculus of bile duct without cholangitis or cholecystitis without obstruction: Secondary | ICD-10-CM

## 2021-11-28 DIAGNOSIS — Z4689 Encounter for fitting and adjustment of other specified devices: Secondary | ICD-10-CM

## 2021-11-28 LAB — COMPREHENSIVE METABOLIC PANEL
ALT: 187 U/L — ABNORMAL HIGH (ref 0–44)
AST: 41 U/L (ref 15–41)
Albumin: 2 g/dL — ABNORMAL LOW (ref 3.5–5.0)
Alkaline Phosphatase: 116 U/L (ref 38–126)
Anion gap: 7 (ref 5–15)
BUN: 26 mg/dL — ABNORMAL HIGH (ref 8–23)
CO2: 22 mmol/L (ref 22–32)
Calcium: 9.3 mg/dL (ref 8.9–10.3)
Chloride: 104 mmol/L (ref 98–111)
Creatinine, Ser: 0.92 mg/dL (ref 0.44–1.00)
GFR, Estimated: 59 mL/min — ABNORMAL LOW (ref >60)
Glucose, Bld: 188 mg/dL — ABNORMAL HIGH (ref 70–99)
Potassium: 3.8 mmol/L (ref 3.5–5.1)
Sodium: 133 mmol/L — ABNORMAL LOW (ref 135–145)
Total Bilirubin: 3.4 mg/dL — ABNORMAL HIGH (ref 0.3–1.2)
Total Protein: 5.2 g/dL — ABNORMAL LOW (ref 6.5–8.1)

## 2021-11-28 LAB — GLUCOSE, CAPILLARY
Glucose-Capillary: 158 mg/dL — ABNORMAL HIGH (ref 70–99)
Glucose-Capillary: 185 mg/dL — ABNORMAL HIGH (ref 70–99)
Glucose-Capillary: 192 mg/dL — ABNORMAL HIGH (ref 70–99)
Glucose-Capillary: 195 mg/dL — ABNORMAL HIGH (ref 70–99)
Glucose-Capillary: 219 mg/dL — ABNORMAL HIGH (ref 70–99)
Glucose-Capillary: 227 mg/dL — ABNORMAL HIGH (ref 70–99)

## 2021-11-28 LAB — CBC
HCT: 33 % — ABNORMAL LOW (ref 36.0–46.0)
Hemoglobin: 11 g/dL — ABNORMAL LOW (ref 12.0–15.0)
MCH: 29.8 pg (ref 26.0–34.0)
MCHC: 33.3 g/dL (ref 30.0–36.0)
MCV: 89.4 fL (ref 80.0–100.0)
Platelets: 109 10*3/uL — ABNORMAL LOW (ref 150–400)
RBC: 3.69 MIL/uL — ABNORMAL LOW (ref 3.87–5.11)
RDW: 13.2 % (ref 11.5–15.5)
WBC: 12.4 10*3/uL — ABNORMAL HIGH (ref 4.0–10.5)
nRBC: 0 % (ref 0.0–0.2)

## 2021-11-28 LAB — LIPASE, BLOOD: Lipase: 36 U/L (ref 11–51)

## 2021-11-28 LAB — HEPARIN LEVEL (UNFRACTIONATED): Heparin Unfractionated: 0.21 IU/mL — ABNORMAL LOW (ref 0.30–0.70)

## 2021-11-28 LAB — MAGNESIUM: Magnesium: 2.3 mg/dL (ref 1.7–2.4)

## 2021-11-28 LAB — APTT: aPTT: 46 seconds — ABNORMAL HIGH (ref 24–36)

## 2021-11-28 LAB — HEMOGLOBIN A1C
Hgb A1c MFr Bld: 8.6 % — ABNORMAL HIGH (ref 4.8–5.6)
Mean Plasma Glucose: 200.12 mg/dL

## 2021-11-28 LAB — PHOSPHORUS: Phosphorus: 2.5 mg/dL (ref 2.5–4.6)

## 2021-11-28 MED ORDER — AMOXICILLIN-POT CLAVULANATE 875-125 MG PO TABS
1.0000 | ORAL_TABLET | Freq: Two times a day (BID) | ORAL | Status: DC
  Administered 2021-11-28 – 2021-11-29 (×2): 1 via ORAL
  Filled 2021-11-28 (×2): qty 1

## 2021-11-28 MED ORDER — POLYETHYLENE GLYCOL 3350 17 G PO PACK: 17.0000 g | PACK | Freq: Every day | ORAL | Status: DC

## 2021-11-28 MED ORDER — POLYETHYLENE GLYCOL 3350 17 G PO PACK
17.0000 g | PACK | Freq: Two times a day (BID) | ORAL | Status: DC
  Administered 2021-11-28: 10:00:00 17 g via ORAL
  Filled 2021-11-28: qty 1

## 2021-11-28 MED ORDER — BISACODYL 10 MG RE SUPP
10.0000 mg | Freq: Once | RECTAL | Status: AC
  Administered 2021-11-28: 13:00:00 10 mg via RECTAL
  Filled 2021-11-28: qty 1

## 2021-11-28 MED ORDER — FLEET ENEMA 7-19 GM/118ML RE ENEM: 1.0000 | ENEMA | Freq: Once | RECTAL | Status: DC

## 2021-11-28 MED ORDER — POLYETHYLENE GLYCOL 3350 17 G PO PACK
17.0000 g | PACK | Freq: Two times a day (BID) | ORAL | Status: DC
  Administered 2021-11-28 – 2021-11-29 (×2): 17 g via ORAL
  Filled 2021-11-28 (×2): qty 1

## 2021-11-28 MED ORDER — SENNA 8.6 MG PO TABS
1.0000 | ORAL_TABLET | Freq: Every day | ORAL | Status: DC
  Administered 2021-11-28 – 2021-11-29 (×2): 8.6 mg via ORAL
  Filled 2021-11-28 (×2): qty 1

## 2021-11-28 NOTE — Telephone Encounter (Signed)
The pt has been scheduled for ERCP with [facility] on 02/11/22 at 730 am at [facility].  I have sent the information to the pt home address.  She will also be given the information at discharge per Dr Rush Landmark. Labs entered for repeat 1 week after discharge.

## 2021-11-28 NOTE — Progress Notes (Addendum)
[place] for heparin Indication: 03/16/21 pulmonary embolus  Allergies  Allergen Reactions   Gramicidin Other (See Comments)    unknown   Sulfa Antibiotics Rash   Wound Dressing Adhesive Rash    Patient Measurements: Height: 5\' 4"  (162.6 cm) Weight: 74.4 kg (164 lb 0.4 oz) IBW/kg (Calculated) : 54.7 Heparin Dosing Weight: 70kg  Vital Signs: Temp: 98.2 F (36.8 C) (12/14 0421) Temp Source: Oral (12/14 0421) BP: 114/61 (12/14 0421) Pulse Rate: 68 (12/14 0421)  Labs: Recent Labs    11/25/21 1832 11/25/21 2015 11/26/21 0106 11/26/21 0623 11/26/21 0942 11/26/21 1443 11/27/21 0155 11/28/21 0418  HGB 12.2  --   --  10.4*  --   --  10.9* 11.0*  HCT 36.4  --   --  30.3*  --   --  31.8* 33.0*  PLT 197  --   --  142*  --   --  108* 109*  APTT  --   --   --   --   --   --  76* 46*  LABPROT  --   --   --   --   --  15.9*  --   --   INR  --   --   --   --   --  1.3*  --   --   HEPARINUNFRC  --   --   --   --   --   --  0.44 0.21*  CREATININE 0.85  --   --  0.79  --   --  0.92  --   TROPONINIHS 11   < > 43* 66* 56*  --   --   --    < > = values in this interval not displayed.     Estimated Creatinine Clearance: 39.4 mL/min (by C-G formula based on SCr of 0.92 mg/dL).   Medical History: Past Medical History:  Diagnosis Date   Arthritis    Choledocholithiasis    Diabetes mellitus without complication ([place])    Diverticulitis    Esophageal cancer ([place]) dx'd 2020   GERD (gastroesophageal reflux disease)    Hyperlipidemia    Hypertension    OSA (obstructive sleep apnea)    Urine incontinence    Uterine cancer [facility])       Assessment: 87 yo W on rivaroxaban PTA for 4/122 segmental right sided PE in the setting of esophageal and uterine cancer. Held rivaroxaban for possible procedure. Last dose 12/10 PM. Pharmacy consulted for heparin.   Heparin was resumed at 10pm after ERCP on 12/13 -aPTT= 46 (was at goal prior to ERCP)  Goal  of Therapy:  Heparin level 0.3-0.7 units/ml aPTT 66-102 seconds Monitor platelets by anticoagulation protocol: Yes   Plan:  -Increase heparin to 1300 units/hr -aPTT in 8 hrs  Hildred Laser, PharmD Clinical Pharmacist **Pharmacist phone directory can now be found on [email or web address] (PW [email or web address]).  Listed under [place].

## 2021-11-28 NOTE — Progress Notes (Addendum)
PROGRESS NOTE    Sherry Holloway  KYH:[id number] DOB: 1928-01-27 DOA: 11/25/2021 PCP: Ann Held, DO   Brief Narrative: 87 year old with past medical history significant for esophageal cancer status postradiation and Xarelto, uterine cancer, PE on Xarelto, diabetes type 2, OSA, hypertension, hyperlipidemia, choledocholithiasis status post ERCP and cholecystectomy presents with right upper quadrant and epigastric abdominal pain, distention and admitted with recurrent choledocholithiasis measuring about 2.1 cm in the distal CBD noted on CT abdomen and pelvis.  Right upper quadrant ultrasound CBD to 13 mm.  She was a started on IV Zosyn.  She presented with lactic acidosis and leukocytosis, bilirubin 3.2. Patient underwent ERCP 12/13 stone extraction and biliary stent placement.    Assessment & Plan:   Principal Problem:   Choledocholithiasis Active Problems:   Hypertension   Type 2 diabetes mellitus with hyperglycemia, without long-term current use of insulin (HCC)   Pulmonary embolus (HCC)   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)   Chronic pain disorder  1-Severe sepsis due to possible cholangitis with choledocholithiasis: POA -Patient presented with leukocytosis, tachypnea and lactic acidosis. -Continue with IV antibiotics plan to transition to oral soon  2-Transaminases hyperbilirubinemia Status post ERCP, stone extraction and stent placement. Liver function tests slowly trending down   3-Abdominal distention, constipation: Started MiraLAX and Senokot Plan for suppository, if it does not work may need enema Nurse will inform GI team, of family concern.   4-Esophageal Cancer:  Status postradiation and Xeloda Follow up out patient with oncology   5-History of PE;  Plan to transition to xarelto when ok by GI.  Discussed with GI, plan to hold heparin Gtt for now. Might be able to resume xarelto in couple of days  SOB;  Could be related to abdominal distension.   Will check KUB, Chest ray.   NIDDM type 2, hyperglycemia: A1c 8.7 Continue with a sliding scale insulin  Hypomagnesemia/hypokalemia: Replaced  Lactic acidosis/metabolic acidosis: Resolved  Elevated troponin: Likely demand ischemia  Normocytic anemia: hb stable.  Glaucoma: Continue with latanoprost. Chronic pain syndrome: Continue with home dose MS Contin Mood disorder: Continue with mirtazapine      Estimated body mass index is 28.15 kg/m as calculated from the following:   Height as of this encounter: 5\' 4"  (1.626 m).   Weight as of this encounter: 74.4 kg.   DVT prophylaxis: Heparin due to history of PE. Code Status: DNR/DNI Family Communication: Daughter who was at bedside Disposition Plan:  Status is: Inpatient  Remains inpatient appropriate because: Patient admitted with possible cholangitis requiring IV antibiotics, underwent ERCP, today developed worsening abdominal distention.        Consultants:  GI  Procedures:  ERCP  Antimicrobials:    Subjective: Patient last bowel movement was Sunday.  She report worsening abdominal distention and required more effort to take a deep breath due to abdominal distention Plan to proceed with suppository. Daughter, with concern about stent functioning correctly , nurse will inform GI team.   Objective: Vitals:   11/27/21 1944 11/27/21 2356 11/28/21 0421 11/28/21 0749  BP: (!) 155/55 (!) 167/60 114/61 111/63  Pulse: 66 66 68 64  Resp: 18 18 18 17   Temp: 98.3 F (36.8 C) 98.1 F (36.7 C) 98.2 F (36.8 C) 98 F (36.7 C)  TempSrc: Oral Oral Oral Oral  SpO2: 97% 98% 97% 99%  Weight:      Height:        Intake/Output Summary (Last 24 hours) at 11/28/2021 1517 Last data filed at  11/27/2021 1517 Gross per 24 hour  Intake 400 ml  Output 500 ml  Net -100 ml   Filed Weights   11/25/21 1721 11/27/21 1322  Weight: 74.4 kg 74.4 kg    Examination:  General exam: Appears calm and comfortable   Respiratory system: Clear to auscultation. Respiratory effort normal. Cardiovascular system: S1 & S2 heard, RRR. No JVD, murmurs, rubs, gallops or clicks. No pedal edema. Gastrointestinal system: Abdomen is nondistended, soft and nontender. No organomegaly or masses felt. Normal bowel sounds heard. Central nervous system: Alert and oriented. No focal neurological deficits. Extremities: Symmetric 5 x 5 power. Skin: No rashes, lesions or ulcers Psychiatry: Judgement and insight appear normal. Mood & affect appropriate.     Data Reviewed: I have personally reviewed following labs and imaging studies  CBC: Recent Labs  Lab 11/25/21 1832 11/26/21 0623 11/27/21 0155 11/28/21 0418  WBC 9.6 18.5* 19.0* 12.4*  NEUTROABS 8.6* 17.1* 16.4*  --   HGB 12.2 10.4* 10.9* 11.0*  HCT 36.4 30.3* 31.8* 33.0*  MCV 89.7 88.3 88.6 89.4  PLT 197 142* 108* 182*   Basic Metabolic Panel: Recent Labs  Lab 11/25/21 1832 11/26/21 0623 11/27/21 0155 11/28/21 0418  NA 136 136 133* 133*  K 4.0 3.5 3.4* 3.8  CL 104 107 104 104  CO2 24 19* 19* 22  GLUCOSE 273* 289* 204* 188*  BUN 19 17 19  26*  CREATININE 0.85 0.79 0.92 0.92  CALCIUM 9.2 8.2* 9.1 9.3  MG  --   --  1.5* 2.3  PHOS  --   --   --  2.5   GFR: Estimated Creatinine Clearance: 39.4 mL/min (by C-G formula based on SCr of 0.92 mg/dL). Liver Function Tests: Recent Labs  Lab 11/25/21 1832 11/26/21 0623 11/27/21 0155 11/28/21 0418  AST 501* 529* 144* 41  ALT 236* 471* 332* 187*  ALKPHOS 158* 133* 109 116  BILITOT 1.7* 3.2* 3.8* 3.4*  PROT 6.8 5.5* 5.4* 5.2*  ALBUMIN 3.7 2.9* 2.6* 2.0*   Recent Labs  Lab 11/25/21 1832 11/26/21 0623 11/28/21 0418  LIPASE 20 21 36   No results for input(s): AMMONIA in the last 168 hours. Coagulation Profile: Recent Labs  Lab 11/26/21 1443  INR 1.3*   Cardiac Enzymes: No results for input(s): CKTOTAL, CKMB, CKMBINDEX, TROPONINI in the last 168 hours. BNP (last 3 results) No results for  input(s): PROBNP in the last 8760 hours. HbA1C: Recent Labs    11/28/21 0418  HGBA1C 8.6*   CBG: Recent Labs  Lab 11/27/21 1609 11/27/21 1949 11/28/21 0003 11/28/21 0416 11/28/21 0750  GLUCAP 148* 192* 192* 195* 158*   Lipid Profile: No results for input(s): CHOL, HDL, LDLCALC, TRIG, CHOLHDL, LDLDIRECT in the last 72 hours. Thyroid Function Tests: No results for input(s): TSH, T4TOTAL, FREET4, T3FREE, THYROIDAB in the last 72 hours. Anemia Panel: No results for input(s): VITAMINB12, FOLATE, FERRITIN, TIBC, IRON, RETICCTPCT in the last 72 hours. Sepsis Labs: Recent Labs  Lab 11/26/21 0106 11/26/21 0623 11/27/21 1612 11/27/21 1855  LATICACIDVEN 3.4* 2.6* 1.8 1.2    Recent Results (from the past 240 hour(s))  Resp Panel by RT-PCR (Flu A&B, Covid) Nasopharyngeal Swab     Status: None   Collection Time: 11/25/21  8:15 PM   Specimen: Nasopharyngeal Swab; Nasopharyngeal(NP) swabs in vial transport medium  Result Value Ref Range Status   SARS Coronavirus 2 by RT PCR NEGATIVE NEGATIVE Final    Comment: (NOTE) SARS-CoV-2 target nucleic acids are NOT DETECTED.  The SARS-CoV-2  RNA is generally detectable in upper respiratory specimens during the acute phase of infection. The lowest concentration of SARS-CoV-2 viral copies this assay can detect is 138 copies/mL. A negative result does not preclude SARS-Cov-2 infection and should not be used as the sole basis for treatment or other patient management decisions. A negative result may occur with  improper specimen collection/handling, submission of specimen other than nasopharyngeal swab, presence of viral mutation(s) within the areas targeted by this assay, and inadequate number of viral copies(<138 copies/mL). A negative result must be combined with clinical observations, patient history, and epidemiological information. The expected result is Negative.  Fact Sheet for Patients:   EntrepreneurPulse.com.au  Fact Sheet for Healthcare Providers:  IncredibleEmployment.be  This test is no t yet approved or cleared by the [place] FDA and  has been authorized for detection and/or diagnosis of SARS-CoV-2 by FDA under an Emergency Use Authorization (EUA). This EUA will remain  in effect (meaning this test can be used) for the duration of the COVID-19 declaration under Section 564(b)(1) of the Act, 21 U.S.C.section 360bbb-3(b)(1), unless the authorization is terminated  or revoked sooner.       Influenza A by PCR NEGATIVE NEGATIVE Final   Influenza B by PCR NEGATIVE NEGATIVE Final    Comment: (NOTE) The Xpert Xpress SARS-CoV-2/FLU/RSV plus assay is intended as an aid in the diagnosis of influenza from Nasopharyngeal swab specimens and should not be used as a sole basis for treatment. Nasal washings and aspirates are unacceptable for Xpert Xpress SARS-CoV-2/FLU/RSV testing.  Fact Sheet for Patients: EntrepreneurPulse.com.au  Fact Sheet for Healthcare Providers: IncredibleEmployment.be  This test is not yet approved or cleared by the [place] FDA and has been authorized for detection and/or diagnosis of SARS-CoV-2 by FDA under an Emergency Use Authorization (EUA). This EUA will remain in effect (meaning this test can be used) for the duration of the COVID-19 declaration under Section 564(b)(1) of the Act, 21 U.S.C. section 360bbb-3(b)(1), unless the authorization is terminated or revoked.  Performed at [facility], [address] [place], [place] [address]          Radiology Studies: DG ERCP WITH SPHINCTEROTOMY  Result Date: 11/27/2021 CLINICAL DATA:  87 year old female with choledocholithiasis undergoing ERCP with biliary stent placement EXAM: ERCP TECHNIQUE: Multiple spot images obtained with the fluoroscopic device and submitted for interpretation  post-procedure. FLUOROSCOPY TIME:  Fluoroscopy Time:  8 minutes 53 seconds Radiation Exposure Index (if provided by the fluoroscopic device): 123.5 Number of Acquired Spot Images: 0 COMPARISON:  CT abdomen/pelvis and abdominal ultrasound 11/25/2021 FINDINGS: A total of 7 intraoperative spot images are submitted for review. The images demonstrate a flexible duodenal scope in the descending duodenum with wire cannulation of the common bile duct. Cholangiography then demonstrates marked dilation of the common bile duct with several rounded filling defects consistent with choledocholithiasis. Subsequent images demonstrate balloon sphincterotomy and placement of a plastic biliary stent. IMPRESSION: 1. Choledocholithiasis. 2. ERCP with sphincterotomy, cholangioplasty and biliary stent placement. These images were submitted for radiologic interpretation only. Please see the procedural report for the amount of contrast and the fluoroscopy time utilized. Electronically Signed   By: Jacqulynn Cadet M.D.   On: 11/27/2021 15:26   DG C-Arm 1-60 Min-No Report  Result Date: 11/27/2021 Fluoroscopy was utilized by the requesting physician.  No radiographic interpretation.        Scheduled Meds:  insulin aspart  0-15 Units Subcutaneous Q4H   irbesartan  37.5 mg Oral Daily  latanoprost  1 drop Both Eyes QHS   mouth rinse  15 mL Mouth Rinse BID   mirtazapine  15 mg Oral Q supper   morphine  30 mg Oral Q12H   pantoprazole  40 mg Oral Daily   valACYclovir  1,000 mg Oral Daily   Continuous Infusions:  heparin 1,300 Units/hr (11/28/21 0616)   lactated ringers 75 mL/hr at 11/27/21 0432   piperacillin-tazobactam (ZOSYN)  IV 3.375 g (11/28/21 0104)     LOS: 2 days    Time spent: 35 minutes    Arbadella Kimbler A Markey Deady, MD [facility]   If 7PM-7AM, please contact night-coverage [email or web address]  11/28/2021, 9:56 AM

## 2021-11-28 NOTE — NC FL2 (Signed)
[place] MEDICAID FL2 LEVEL OF CARE SCREENING TOOL     IDENTIFICATION  Patient Name: Sherry Holloway Birthdate: 1928/02/27 Sex: female Admission Date (Current Location): 11/25/2021  [facility] [place] Number:  Herbalist and Address:  [facility], [place] [address] [place], [place] [address]      Provider Number: [id number]  Attending Physician Name and Address:  Elmarie Shiley, MD  Relative Name and Phone Number:       Current Level of Care: Hospital Recommended Level of Care: [place] Prior Approval Number:    Date Approved/Denied:   PASRR Number:    Discharge Plan: SNF    Current Diagnoses: Patient Active Problem List   Diagnosis Date Noted   Chronic pain disorder 11/26/2021   Choledocholithiasis 11/25/2021   Personal history of esophageal cancer 08/26/2021   Weight loss, unintentional 08/26/2021   Type 2 diabetes mellitus with diabetic polyneuropathy, without long-term current use of insulin ([place]) 06/12/2021   Snoring 04/30/2021   At risk for central sleep apnea 04/30/2021   Acute pulmonary embolism ([place]) 03/29/2021   Cancer of middle third of esophagus ([place]) 03/19/2021   Pulmonary embolus ([place]) 03/16/2021   Subclavian arterial stenosis ([place]) 02/13/2021   Type 2 diabetes mellitus with hyperglycemia, without long-term current use of insulin ([place]) 02/06/2021   Acquired trigger finger of left middle finger 01/09/2021   Acquired trigger finger of left ring finger 01/09/2021   Trigger finger of left hand 12/24/2020   Plantar wart 12/24/2020   Pancreatic mass 10/19/2020   Gastroesophageal reflux disease with esophagitis without hemorrhage 10/19/2020   Hypertension 10/19/2020   Diabetes mellitus without complication ([place]) 37/16/9678   Vaginal odor 10/19/2020   Hyperlipidemia 10/19/2020   OSA (obstructive sleep apnea) 10/19/2020   Chronic bilateral back pain 10/19/2020    Orientation RESPIRATION BLADDER  Height & Weight     Self, Time, Situation, Place  Normal Incontinent Weight: 164 lb 0.4 oz (74.4 kg) Height:  5\' 4"  (162.6 cm)  BEHAVIORAL SYMPTOMS/MOOD NEUROLOGICAL BOWEL NUTRITION STATUS      Continent Diet (See dc summary)  AMBULATORY STATUS COMMUNICATION OF NEEDS Skin   Limited Assist Verbally Normal                       Personal Care Assistance Level of Assistance  Bathing, Feeding, Dressing Bathing Assistance: Limited assistance Feeding assistance: Independent Dressing Assistance: Limited assistance     Functional Limitations Info  Sight, Hearing, Speech Sight Info: Adequate Hearing Info: Impaired (has hearing aid) Speech Info: Adequate    SPECIAL CARE FACTORS FREQUENCY  OT (By licensed OT), PT (By licensed PT)     PT Frequency: 5x a week OT Frequency: 5x a week            Contractures Contractures Info: Not present    Additional Factors Info  Code Status, Allergies Code Status Info: DNR Allergies Info: Gramicidin   Sulfa Antibiotics   Wound Dressing Adhesive           Current Medications (11/28/2021):  This is the current hospital active medication list Current Facility-Administered Medications  Medication Dose Route Frequency Provider Last Rate Last Admin   acetaminophen (TYLENOL) tablet 650 mg  650 mg Oral Q6H PRN Karmen Bongo, MD   650 mg at 11/28/21 1003   Or   acetaminophen (TYLENOL) suppository 650 mg  650 mg Rectal Q6H PRN Karmen Bongo, MD       heparin ADULT infusion  100 units/mL (25000 units/256mL)  1,300 Units/hr Intravenous Continuous Kris Mouton, RPH 13 mL/hr at 11/28/21 0616 1,300 Units/hr at 11/28/21 0616   hydrALAZINE (APRESOLINE) injection 5 mg  5 mg Intravenous Q4H PRN Karmen Bongo, MD       insulin aspart (novoLOG) injection 0-15 Units  0-15 Units Subcutaneous Salvadore Oxford, MD   3 Units at 11/28/21 0818   irbesartan (AVAPRO) tablet 37.5 mg  37.5 mg Oral Daily Karmen Bongo, MD   37.5 mg at 11/28/21 1003    latanoprost (XALATAN) 0.005 % ophthalmic solution 1 drop  1 drop Both Eyes QHS Karmen Bongo, MD   1 drop at 11/27/21 2236   MEDLINE mouth rinse  15 mL Mouth Rinse BID Karmen Bongo, MD   15 mL at 11/28/21 1005   mirtazapine (REMERON) tablet 15 mg  15 mg Oral Q supper Karmen Bongo, MD   15 mg at 11/27/21 1828   morphine (MS CONTIN) 12 hr tablet 30 mg  30 mg Oral Lillia Mountain, MD   30 mg at 11/28/21 1004   morphine 2 MG/ML injection 2-4 mg  2-4 mg Intravenous Q2H PRN Karmen Bongo, MD   2 mg at 11/27/21 1117   ondansetron (ZOFRAN) tablet 4 mg  4 mg Oral Q6H PRN Karmen Bongo, MD       Or   ondansetron [facility]) injection 4 mg  4 mg Intravenous Q6H PRN Karmen Bongo, MD       pantoprazole (PROTONIX) EC tablet 40 mg  40 mg Oral Daily Karmen Bongo, MD   40 mg at 11/28/21 1003   piperacillin-tazobactam (ZOSYN) IVPB 3.375 g  3.375 g Intravenous Q8H Kris Mouton, RPH 12.5 mL/hr at 11/28/21 1006 3.375 g at 11/28/21 1006   polyethylene glycol (MIRALAX / GLYCOLAX) packet 17 g  17 g Oral BID Regalado, Belkys A, MD   17 g at 11/28/21 1021   senna (SENOKOT) tablet 8.6 mg  1 tablet Oral Daily Regalado, Belkys A, MD   8.6 mg at 11/28/21 1021   valACYclovir (VALTREX) tablet 1,000 mg  1,000 mg Oral Daily Karmen Bongo, MD   1,000 mg at 11/28/21 1004     Discharge Medications: Please see discharge summary for a list of discharge medications.  Relevant Imaging Results:  Relevant Lab Results:   Additional Information SSN [id number], covid vax plus 2 boosters  Emeterio Reeve, [place]

## 2021-11-28 NOTE — Evaluation (Signed)
Physical Therapy Evaluation Patient Details Name: Sherry Holloway MRN: [id number] DOB: 11-03-1928 Today's Date: 11/28/2021  History of Present Illness  87 y.o. female with medical history significant of DM; esophageal cancer; uterine cancer; PE on Xarelto; HTN; HLD; and OSA presenting with abdominal pain.   Patient with prior h/o choledocholithiasis s/p cholecystectomy and ERCP presenting with recurrent choledocholithiasis, admitted for abdominal pain.  Underwent ERCP 12/13.   Clinical Impression  Pt admitted with above. Pt was indep with ADLs, transfers, and use rollator for ambulation PTA. Pt now with noted deconditioning, mild confusion, impaired balance, and decreased activity tolerance with noted SOB with activity. Pt reports "I didn't think I was this weak." Pt not able to safely return home alone at this time. Pt to benefit from ST-SNF upon d/c to address above deficits to achieve safe mod I level of function for safe transition home.       Recommendations for follow up therapy are one component of a multi-disciplinary discharge planning process, led by the attending physician.  Recommendations may be updated based on patient status, additional functional criteria and insurance authorization.  Follow Up Recommendations Skilled nursing-short term rehab (<3 hours/day)    Assistance Recommended at Discharge Frequent or constant Supervision/Assistance  Functional Status Assessment Patient has had a recent decline in their functional status and demonstrates the ability to make significant improvements in function in a reasonable and predictable amount of time.  Equipment Recommendations   (TBD at next venue)    Recommendations for Other Services       Precautions / Restrictions Precautions Precautions: Fall Restrictions Weight Bearing Restrictions: No Other Position/Activity Restrictions: prior R TKA      Mobility  Bed Mobility Overal bed mobility: Needs Assistance Bed Mobility:  Supine to Sit     Supine to sit: Mod assist     General bed mobility comments: pt received at bathroom sink with OT    Transfers Overall transfer level: Needs assistance Equipment used: Rolling walker (2 wheels) Transfers: Sit to/from Stand Sit to Stand: Min assist           General transfer comment: cueing to scoot forward and push from solid surface, unable to bend R knee to use foot efficiently to push up from requiring minA to power up    Ambulation/Gait Ambulation/Gait assistance: Min assist Gait Distance (Feet): 40 Feet Assistive device: Rolling walker (2 wheels) Gait Pattern/deviations: Decreased stride length;Trunk flexed;Decreased weight shift to left;Decreased step length - right;Step-to pattern Gait velocity: slow     General Gait Details: pt with onset of DOE, SPO2 at 92% on RA, pt with difficulty clearing R foot compared to L with noted weakness in R knee (recent TKA) with some instability with increased amb  Stairs            Wheelchair Mobility    Modified Rankin (Stroke Patients Only)       Balance Overall balance assessment: Needs assistance Sitting-balance support: Feet supported Sitting balance-Leahy Scale: Fair     Standing balance support: Bilateral upper extremity supported;Reliant on assistive device for balance Standing balance-Leahy Scale: Poor Standing balance comment: pt attempting to put hearing aide in at sink, pt with noted significant ankle strategy to aide in balancing and would frequently put hand down on counter to steady herself                             Pertinent Vitals/Pain Pain Assessment: No/denies pain  Home Living Family/patient expects to be discharged to:: Private residence Living Arrangements: Alone Available Help at Discharge: Family;Available PRN/intermittently Type of Home: House Home Access: Level entry       Home Layout: One level Home Equipment: Rollator (4 wheels);Grab bars -  tub/shower;Shower seat      Prior Function Prior Level of Function : Independent/Modified Independent             Mobility Comments: Patient states she uses a 4WRW in/out of the home. ADLs Comments: Able to bathe and dress herself from sit/stand level.  Daughter assists with community mobility and meals.  She has someone for cleaning.     Hand Dominance   Dominant Hand: Right    Extremity/Trunk Assessment   Upper Extremity Assessment Upper Extremity Assessment: Generalized weakness    Lower Extremity Assessment Lower Extremity Assessment: Generalized weakness    Cervical / Trunk Assessment Cervical / Trunk Assessment: Kyphotic  Communication   Communication: No difficulties  Cognition Arousal/Alertness: Awake/alert Behavior During Therapy: WFL for tasks assessed/performed Overall Cognitive Status: No family/caregiver present to determine baseline cognitive functioning                                 General Comments: Presents with mild confusion, impaired sequencing, and complex thought deficts.  Decreased command following but extremely HOH        General Comments General comments (skin integrity, edema, etc.): SPO2 at 92% on RA    Exercises     Assessment/Plan    PT Assessment Patient needs continued PT services  PT Problem List Decreased strength;Decreased activity tolerance;Decreased balance;Decreased mobility;Decreased knowledge of use of DME       PT Treatment Interventions DME instruction;Gait training;Functional mobility training;Stair training;Therapeutic exercise;Therapeutic activities;Balance training    PT Goals (Current goals can be found in the Care Plan section)  Acute Rehab PT Goals Patient Stated Goal: get stronger PT Goal Formulation: With patient/family Time For Goal Achievement: 12/12/21 Potential to Achieve Goals: Good    Frequency Min 3X/week   Barriers to discharge Decreased caregiver support lives alone     Co-evaluation               AM-PAC PT "6 Clicks" Mobility  Outcome Measure Help needed turning from your back to your side while in a flat bed without using bedrails?: A Little           6 Click Score: 3    End of Session Equipment Utilized During Treatment: Gait belt Activity Tolerance: Patient tolerated treatment well Patient left: in chair;with call bell/phone within reach;with chair alarm set Nurse Communication: Mobility status (Spo2 on RA) PT Visit Diagnosis: Unsteadiness on feet (R26.81);Muscle weakness (generalized) (M62.81);Difficulty in walking, not elsewhere classified (R26.2)    Time: 1324-4010 PT Time Calculation (min) (ACUTE ONLY): 19 min   Charges:   PT Evaluation $PT Eval Moderate Complexity: 1 Mod          Kittie Plater, PT, DPT Acute Rehabilitation Services Pager #: [phone] Office #: [phone]   Berline Lopes 11/28/2021, 10:21 AM

## 2021-11-28 NOTE — TOC Initial Note (Signed)
Transition of Care [facility]) - Initial/Assessment Note    Patient Details  Name: Sherry Holloway MRN: [id number] Date of Birth: 26-Jun-1928  [facility]) CM/SW Contact:    Emeterio Reeve, LCSW Phone Number: 11/28/2021, 4:05 PM  Clinical Narrative:                  CSW received SNF consult. CSW met with pt and daughter Cline Crock at bedside. CSW introduced self and explained role at the hospital. Pt reports that PTA she lived at home alone. Jodette lives a mile away. Pt used a rollator but was independent with mobility and ADL's. Pt has a Chartered certified accountant that comes twice a month. Jodette prepares meals that pt only has to heat up.   CSW reviewed PT/OT recommendations for SNF. Pt and daughter agree that SNF is their best option. Pt gave CSW permission to fax out to facilities in the area. Pt prefers [facility] for SNF. CSW contacted [facility] who stated they have no beds available until after Christmas.  CSW gave pt medicare.gov rating list to review. CSW explained insurance auth process. Pt reports they are covid vaccinated with two boosters.   CSW will continue to follow.   Expected Discharge Plan: Skilled Nursing Facility Barriers to Discharge: Continued Medical Work up   Patient Goals and CMS Choice Patient states their goals for this hospitalization and ongoing recovery are:: to get stronger then return home CMS Medicare.gov Compare Post Acute Care list provided to:: Patient Choice offered to / list presented to : Patient, Adult Children  Expected Discharge Plan and Services Expected Discharge Plan: [place]       Living arrangements for the past 2 months: Apartment                                      Prior Living Arrangements/Services Living arrangements for the past 2 months: Apartment Lives with:: Self Patient language and need for interpreter reviewed:: Yes Do you feel safe going back to the place where you live?: Yes      Need for Family  Participation in Patient Care: Yes (Comment) Care giver support system in place?: Yes (comment) Current home services: DME Criminal Activity/Legal Involvement Pertinent to Current Situation/Hospitalization: No - Comment as needed  Activities of Daily Living      Permission Sought/Granted Permission sought to share information with : Family Supports, Chartered certified accountant granted to share information with : Yes, Verbal Permission Granted     Permission granted to share info w AGENCY: SNF        Emotional Assessment Appearance:: Appears stated age Attitude/Demeanor/Rapport: Ambitious Affect (typically observed): Appropriate Orientation: : Oriented to Self, Oriented to Place, Oriented to  Time, Oriented to Situation Alcohol / Substance Use: Not Applicable Psych Involvement: No (comment)  Admission diagnosis:  Choledocholithiasis [K80.50] RUQ abdominal pain [R10.11] Patient Active Problem List   Diagnosis Date Noted   Chronic pain disorder 11/26/2021   Choledocholithiasis 11/25/2021   Personal history of esophageal cancer 08/26/2021   Weight loss, unintentional 08/26/2021   Type 2 diabetes mellitus with diabetic polyneuropathy, without long-term current use of insulin ([place]) 06/12/2021   Snoring 04/30/2021   At risk for central sleep apnea 04/30/2021   Acute pulmonary embolism ([place]) 03/29/2021   Cancer of middle third of esophagus ([place]) 03/19/2021   Pulmonary embolus ([place]) 03/16/2021   Subclavian arterial stenosis ([place]) 02/13/2021   Type  2 diabetes mellitus with hyperglycemia, without long-term current use of insulin ([place]) 02/06/2021   Acquired trigger finger of left middle finger 01/09/2021   Acquired trigger finger of left ring finger 01/09/2021   Trigger finger of left hand 12/24/2020   Plantar wart 12/24/2020   Pancreatic mass 10/19/2020   Gastroesophageal reflux disease with esophagitis without hemorrhage 10/19/2020   Hypertension 10/19/2020    Diabetes mellitus without complication ([place]) 97/18/2099   Vaginal odor 10/19/2020   Hyperlipidemia 10/19/2020   OSA (obstructive sleep apnea) 10/19/2020   Chronic bilateral back pain 10/19/2020   PCP:  Ann Held, DO Pharmacy:   [facility] [place] [address] [place] [address] Phone: [phone] Fax: [facility] #[id number] - [address] AT [facility] [place] [address] Phone: [phone] Fax: [phone]  [place] [place] [address] Phone: [phone] Fax: [phone]     Social Determinants of Health (SDOH) Interventions    Readmission Risk Interventions No flowsheet data found.  Emeterio Reeve, LCSW Clinical Social Worker

## 2021-11-28 NOTE — Evaluation (Signed)
Occupational Therapy Evaluation Patient Details Name: Sherry Holloway MRN: [id number] DOB: 01-17-1928 Today's Date: 11/28/2021   History of Present Illness 87 y.o. female with medical history significant of DM; esophageal cancer; uterine cancer; PE on Xarelto; HTN; HLD; and OSA presenting with abdominal pain.   Patient with prior h/o choledocholithiasis s/p cholecystectomy and ERCP presenting with recurrent choledocholithiasis, admitted for abdominal pain.  Underwent ERCP 12/13.   Clinical Impression   Patient admitted for the diagnosis and procedure above.  PTA she lives alone in a one story condo.  Her daughter works full time and can only provide PRN assist, per the patient.  The daughter does assist with community mobility and meals.  Deficits impacting independence are listed below.  Currently she is needing up to Mod A for bed mobility, Min A for sit to stand, Min A for toileting and up to Mod A for lower body ADL.  If the patient is in fact scheduled for discharge today, she would not be safe discharging home without 24 hour assist as needed, and thus SNF is recommended for short term rehab prior to returning home.  If she remains in the acute setting, OT will follow to maximize her functional status.          Recommendations for follow up therapy are one component of a multi-disciplinary discharge planning process, led by the attending physician.  Recommendations may be updated based on patient status, additional functional criteria and insurance authorization.   Follow Up Recommendations  Skilled nursing-short term rehab (<3 hours/day)    Assistance Recommended at Discharge Frequent or constant Supervision/Assistance  Functional Status Assessment  Patient has had a recent decline in their functional status and demonstrates the ability to make significant improvements in function in a reasonable and predictable amount of time.  Equipment Recommendations  BSC/3in1    Recommendations for  Other Services       Precautions / Restrictions Precautions Precautions: Fall Restrictions Weight Bearing Restrictions: No Other Position/Activity Restrictions: prior R TKA      Mobility Bed Mobility Overal bed mobility: Needs Assistance Bed Mobility: Supine to Sit     Supine to sit: Mod assist          Transfers Overall transfer level: Needs assistance Equipment used: Rolling walker (2 wheels) Transfers: Sit to/from Stand Sit to Stand: Min assist           General transfer comment: cueing to scoot forward and push from solid surface.      Balance Overall balance assessment: Needs assistance Sitting-balance support: Feet supported Sitting balance-Leahy Scale: Fair     Standing balance support: Bilateral upper extremity supported;Reliant on assistive device for balance Standing balance-Leahy Scale: Poor                             ADL either performed or assessed with clinical judgement   ADL Overall ADL's : Needs assistance/impaired Eating/Feeding: Independent;Sitting   Grooming: Wash/dry hands;Wash/dry face;Min guard;Standing   Upper Body Bathing: Set up;Sitting   Lower Body Bathing: Moderate assistance;Sit to/from stand   Upper Body Dressing : Minimal assistance;Sitting   Lower Body Dressing: Moderate assistance;Sit to/from stand   Toilet Transfer: Minimal assistance;Ambulation;Regular Museum/gallery exhibitions officer and Hygiene: Min guard;Sit to/from stand               Vision Baseline Vision/History: 1 Wears glasses Patient Visual Report: No change from baseline       Perception Perception  Perception: Within Functional Limits   Praxis Praxis Praxis: Intact    Pertinent Vitals/Pain Pain Assessment: No/denies pain     Hand Dominance Right   Extremity/Trunk Assessment Upper Extremity Assessment Upper Extremity Assessment: Overall WFL for tasks assessed   Lower Extremity Assessment Lower Extremity  Assessment: Defer to PT evaluation   Cervical / Trunk Assessment Cervical / Trunk Assessment: Kyphotic   Communication Communication Communication: No difficulties   Cognition Arousal/Alertness: Awake/alert Behavior During Therapy: WFL for tasks assessed/performed Overall Cognitive Status: No family/caregiver present to determine baseline cognitive functioning                                 General Comments: Presents with mild confusion and complex thought deficts.  Decreased command following, also HOH.     General Comments       Exercises     Shoulder Instructions      Home Living Family/patient expects to be discharged to:: Private residence Living Arrangements: Alone Available Help at Discharge: Family;Available PRN/intermittently Type of Home: House Home Access: Level entry     Home Layout: One level     Bathroom Shower/Tub: Occupational psychologist: Handicapped height Bathroom Accessibility: Yes How Accessible: Accessible via walker Home Equipment: Rollator (4 wheels);Grab bars - tub/shower;Shower seat          Prior Functioning/Environment Prior Level of Function : Independent/Modified Independent             Mobility Comments: Patient states she uses a 4WRW in/out of the home. ADLs Comments: Able to bathe and dress herself from sit/stand level.  Daughter assists with community mobility and meals.  She has someone for cleaning.        OT Problem List: Decreased strength;Decreased activity tolerance;Decreased range of motion;Impaired balance (sitting and/or standing);Decreased knowledge of use of DME or AE;Decreased safety awareness      OT Treatment/Interventions: Self-care/ADL training;Therapeutic exercise;Therapeutic activities;Patient/family education;Balance training;DME and/or AE instruction    OT Goals(Current goals can be found in the care plan section) Acute Rehab OT Goals Patient Stated Goal: Return home OT Goal  Formulation: With patient Time For Goal Achievement: 12/12/21 Potential to Achieve Goals: Good ADL Goals Pt Will Perform Grooming: with modified independence;standing Pt Will Perform Lower Body Bathing: with modified independence;sit to/from stand Pt Will Perform Lower Body Dressing: with modified independence;sit to/from stand Pt Will Transfer to Toilet: with modified independence;ambulating;regular height toilet Pt Will Perform Toileting - Clothing Manipulation and hygiene: with modified independence;sit to/from stand Pt/caregiver will Perform Home Exercise Program: Increased strength;Both right and left upper extremity;With theraband;With written HEP provided  OT Frequency: Min 2X/week   Barriers to D/C: Decreased caregiver support          Co-evaluation              AM-PAC OT "6 Clicks" Daily Activity     Outcome Measure Help from another person eating meals?: None Help from another person taking care of personal grooming?: A Little Help from another person toileting, which includes using toliet, bedpan, or urinal?: A Little Help from another person bathing (including washing, rinsing, drying)?: A Lot Help from another person to put on and taking off regular upper body clothing?: A Little Help from another person to put on and taking off regular lower body clothing?: A Lot 6 Click Score: 17   End of Session Equipment Utilized During Treatment: Gait belt;Rolling walker (2 wheels);Oxygen Nurse Communication:  Mobility status  Activity Tolerance: Patient tolerated treatment well Patient left: in chair;with call bell/phone within reach;with chair alarm set  OT Visit Diagnosis: Unsteadiness on feet (R26.81);Muscle weakness (generalized) (M62.81);Other symptoms and signs involving cognitive function;Dizziness and giddiness (R42)                Time: 7276-1848 OT Time Calculation (min): 18 min Charges:  OT General Charges $OT Visit: 1 Visit OT Evaluation $OT Eval Moderate  Complexity: 1 Mod  11/28/2021  RP, OTR/L  Acute Rehabilitation Services  Office:  [phone]   Metta Clines 11/28/2021, 9:04 AM

## 2021-11-28 NOTE — Progress Notes (Addendum)
Daily Rounding Note  11/28/2021, 10:06 AM  LOS: 2 days   SUBJECTIVE:   Chief complaint: Recurrent choledocholithiasis.  IPMN.  Some lingering pain in right abdomen.  Constipated.  Tolerating soft diet with good appetite.  No nausea.  OBJECTIVE:         Vital signs in last 24 hours:    Temp:  [97 F (36.1 C)-98.3 F (36.8 C)] 98 F (36.7 C) (12/14 0749) Pulse Rate:  [64-77] 64 (12/14 0749) Resp:  [17-23] 17 (12/14 0749) BP: (105-167)/(31-63) 111/63 (12/14 0749) SpO2:  [91 %-99 %] 99 % (12/14 0749) Weight:  [74.4 kg] 74.4 kg (12/13 1322)   Filed Weights   11/25/21 1721 11/27/21 1322  Weight: 74.4 kg 74.4 kg   General: Looks well, does not appear as old as stated age. Heart: RRR. Chest: Diminished breath sounds but clear bilaterally.  No labored breathing or cough. Abdomen: Slight tenderness without guarding or rebound on the right mid and upper abdomen.  No HSM, masses, bruits, hernias. Extremities: No CCE. Neuro/Psych: Appropriate.  Oriented x3.  Moves all 4 limbs.  Intake/Output from previous day: 12/13 0701 - 12/14 0700 In: 400 [I.V.:400] Out: 500 [Urine:500]  Intake/Output this shift: No intake/output data recorded.  Lab Results: Recent Labs    11/26/21 0623 11/27/21 0155 11/28/21 0418  WBC 18.5* 19.0* 12.4*  HGB 10.4* 10.9* 11.0*  HCT 30.3* 31.8* 33.0*  PLT 142* 108* 109*   BMET Recent Labs    11/26/21 0623 11/27/21 0155 11/28/21 0418  NA 136 133* 133*  K 3.5 3.4* 3.8  CL 107 104 104  CO2 19* 19* 22  GLUCOSE 289* 204* 188*  BUN 17 19 26*  CREATININE 0.79 0.92 0.92  CALCIUM 8.2* 9.1 9.3   LFT Recent Labs    11/26/21 0623 11/27/21 0155 11/28/21 0418  PROT 5.5* 5.4* 5.2*  ALBUMIN 2.9* 2.6* 2.0*  AST 529* 144* 41  ALT 471* 332* 187*  ALKPHOS 133* 109 116  BILITOT 3.2* 3.8* 3.4*   PT/INR Recent Labs    11/26/21 1443  LABPROT 15.9*  INR 1.3*   Hepatitis Panel No results  for input(s): HEPBSAG, HCVAB, HEPAIGM, HEPBIGM in the last 72 hours.  Studies/Results: DG ERCP WITH SPHINCTEROTOMY  Result Date: 11/27/2021 CLINICAL DATA:  87 year old female with choledocholithiasis undergoing ERCP with biliary stent placement EXAM: ERCP TECHNIQUE: Multiple spot images obtained with the fluoroscopic device and submitted for interpretation post-procedure. FLUOROSCOPY TIME:  Fluoroscopy Time:  8 minutes 53 seconds Radiation Exposure Index (if provided by the fluoroscopic device): 123.5 Number of Acquired Spot Images: 0 COMPARISON:  CT abdomen/pelvis and abdominal ultrasound 11/25/2021 FINDINGS: A total of 7 intraoperative spot images are submitted for review. The images demonstrate a flexible duodenal scope in the descending duodenum with wire cannulation of the common bile duct. Cholangiography then demonstrates marked dilation of the common bile duct with several rounded filling defects consistent with choledocholithiasis. Subsequent images demonstrate balloon sphincterotomy and placement of a plastic biliary stent. IMPRESSION: 1. Choledocholithiasis. 2. ERCP with sphincterotomy, cholangioplasty and biliary stent placement. These images were submitted for radiologic interpretation only. Please see the procedural report for the amount of contrast and the fluoroscopy time utilized. Electronically Signed   By: Jacqulynn Cadet M.D.   On: 11/27/2021 15:26   DG C-Arm 1-60 Min-No Report  Result Date: 11/27/2021 Fluoroscopy was utilized by the requesting physician.  No radiographic interpretation.    Scheduled Meds:  insulin aspart  0-15  Units Subcutaneous Q4H   irbesartan  37.5 mg Oral Daily   latanoprost  1 drop Both Eyes QHS   mouth rinse  15 mL Mouth Rinse BID   mirtazapine  15 mg Oral Q supper   morphine  30 mg Oral Q12H   pantoprazole  40 mg Oral Daily   polyethylene glycol  17 g Oral BID   senna  1 tablet Oral Daily   valACYclovir  1,000 mg Oral Daily   Continuous  Infusions:  heparin 1,300 Units/hr (11/28/21 0616)   piperacillin-tazobactam (ZOSYN)  IV 3.375 g (11/28/21 1006)   PRN Meds:.acetaminophen **OR** acetaminophen, hydrALAZINE, morphine injection, ondansetron **OR** ondansetron (ZOFRAN) IV   ASSESMENT:     Recurrent choledocholithiasis. Prior ERCPs and sphincterotomy.   11/27/21 ERCP w balloon dilatation of previous sphincterotomy, balloon sweep of debris/stones and placement of 10 French, 5 cm long plastic biliary stent out of concern for impacted debris at distal CBD.  Biopsy obtained of ratty/[place] appearing PD orifice which was exuding copious mucosa to palpable for IPNM.  WBCs improved.  On Zosyn.  No micro/culture data.   LFTs improving, T bili slow to improve c/w alk phos and transaminases.      IPNM of main pancreatic duct.  Biopsy obtained of ratty/[place] appearing PD orifice which was exuding copious mucosa to palpable for IPNM.  Squamous cell cancer mid esophagus.  [facility].  No obvious cancer as visualized at ERCP (used standard EGD to assess esophagus) yesterday.    Thrombocytopenia at 109.      Coagulopathy.  INR 1.3.      Hx PE.   Xarelto on hold,  IV Heparin in place.      [place] anemia.  Mild, Hgb 11.    Chronic constipation in setting of chronic MS Contin.  Bid MiraLAX, daily Senokot in place, both initiated this morning.  PLAN     Change to po Augmentin for 5 days, will d/w Dr Havery Moros.      Await results of path from PD.      Arrange fup ERCP and stent removal w Dr Rush Landmark in 6 to 8 weeks, office will notify pt.      Azucena Freed  11/28/2021, 10:06 AM Phone [phone]

## 2021-11-28 NOTE — Telephone Encounter (Signed)
-----   Message from Irving Copas., MD sent at 11/28/2021 12:05 PM EST ----- Regarding: [facility] patient Sherry Holloway, DJ and I discussed this patient.  She has a new biliary stent in place.  She will need a repeat ERCP with cholangioscopy/spyglass in 6 to 8 weeks with me. Please schedule as able with me. She remains in the hospital at this time so if we have a date in the system, I will show up at discharge. Once she leaves the hospital she should have repeat LFTs within a week of discharge. Thanks. GM

## 2021-11-29 ENCOUNTER — Encounter (HOSPITAL_COMMUNITY): Payer: Self-pay | Admitting: Gastroenterology

## 2021-11-29 LAB — COMPREHENSIVE METABOLIC PANEL
ALT: 119 U/L — ABNORMAL HIGH (ref 0–44)
AST: 25 U/L (ref 15–41)
Albumin: 1.9 g/dL — ABNORMAL LOW (ref 3.5–5.0)
Alkaline Phosphatase: 124 U/L (ref 38–126)
Anion gap: 6 (ref 5–15)
BUN: 24 mg/dL — ABNORMAL HIGH (ref 8–23)
CO2: 23 mmol/L (ref 22–32)
Calcium: 8.8 mg/dL — ABNORMAL LOW (ref 8.9–10.3)
Chloride: 103 mmol/L (ref 98–111)
Creatinine, Ser: 0.92 mg/dL (ref 0.44–1.00)
GFR, Estimated: 59 mL/min — ABNORMAL LOW (ref >60)
Glucose, Bld: 170 mg/dL — ABNORMAL HIGH (ref 70–99)
Potassium: 3.7 mmol/L (ref 3.5–5.1)
Sodium: 132 mmol/L — ABNORMAL LOW (ref 135–145)
Total Bilirubin: 2 mg/dL — ABNORMAL HIGH (ref 0.3–1.2)
Total Protein: 4.8 g/dL — ABNORMAL LOW (ref 6.5–8.1)

## 2021-11-29 LAB — GLUCOSE, CAPILLARY
Glucose-Capillary: 144 mg/dL — ABNORMAL HIGH (ref 70–99)
Glucose-Capillary: 164 mg/dL — ABNORMAL HIGH (ref 70–99)
Glucose-Capillary: 207 mg/dL — ABNORMAL HIGH (ref 70–99)
Glucose-Capillary: 93 mg/dL (ref 70–99)

## 2021-11-29 LAB — RESP PANEL BY RT-PCR (FLU A&B, COVID) ARPGX2
Influenza A by PCR: NEGATIVE
Influenza B by PCR: NEGATIVE
SARS Coronavirus 2 by RT PCR: NEGATIVE

## 2021-11-29 LAB — CBC
HCT: 30.4 % — ABNORMAL LOW (ref 36.0–46.0)
Hemoglobin: 10 g/dL — ABNORMAL LOW (ref 12.0–15.0)
MCH: 29.2 pg (ref 26.0–34.0)
MCHC: 32.9 g/dL (ref 30.0–36.0)
MCV: 88.9 fL (ref 80.0–100.0)
Platelets: 112 10*3/uL — ABNORMAL LOW (ref 150–400)
RBC: 3.42 MIL/uL — ABNORMAL LOW (ref 3.87–5.11)
RDW: 13 % (ref 11.5–15.5)
WBC: 10.2 10*3/uL (ref 4.0–10.5)
nRBC: 0 % (ref 0.0–0.2)

## 2021-11-29 LAB — SURGICAL PATHOLOGY

## 2021-11-29 MED ORDER — SENNA 8.6 MG PO TABS: 1.0000 | ORAL_TABLET | Freq: Every day | ORAL | 0 refills | Status: DC

## 2021-11-29 MED ORDER — RIVAROXABAN 20 MG PO TABS: 20.0000 mg | ORAL_TABLET | Freq: Every day | ORAL | 3 refills | Status: DC

## 2021-11-29 MED ORDER — MORPHINE SULFATE ER 30 MG PO TBCR: 30.0000 mg | EXTENDED_RELEASE_TABLET | Freq: Two times a day (BID) | ORAL | 0 refills | Status: AC

## 2021-11-29 MED ORDER — AMOXICILLIN-POT CLAVULANATE 875-125 MG PO TABS: 1.0000 | ORAL_TABLET | Freq: Two times a day (BID) | ORAL | 0 refills | Status: AC

## 2021-11-29 NOTE — Discharge Instructions (Signed)
Information on my medicine - XARELTO (rivaroxaban)  This medication education was reviewed with me or my healthcare representative as part of my discharge preparation.    WHY WAS XARELTO PRESCRIBED FOR YOU? Xarelto was prescribed to treat blood clots that may have been found in the veins of your legs (deep vein thrombosis) or in your lungs (pulmonary embolism) and to reduce the risk of them occurring again.  What do you need to know about Xarelto? Continue taking Xarelto one 20 mg tablet taken ONCE A DAY with your evening meal.  DO NOT stop taking Xarelto without talking to the health care provider who prescribed the medication.  Refill your prescription for 20 mg tablets before you run out.  After discharge, you should have regular check-up appointments with your healthcare provider that is prescribing your Xarelto.  In the future your dose may need to be changed if your kidney function changes by a significant amount.  What do you do if you miss a dose? If you are taking Xarelto TWICE DAILY and you miss a dose, take it as soon as you remember. You may take two 15 mg tablets (total 30 mg) at the same time then resume your regularly scheduled 15 mg twice daily the next day.  If you are taking Xarelto ONCE DAILY and you miss a dose, take it as soon as you remember on the same day then continue your regularly scheduled once daily regimen the next day. Do not take two doses of Xarelto at the same time.   Important Safety Information Xarelto is a blood thinner medicine that can cause bleeding. You should call your healthcare provider right away if you experience any of the following: Bleeding from an injury or your nose that does not stop. Unusual colored urine (red or dark brown) or unusual colored stools (red or black). Unusual bruising for unknown reasons. A serious fall or if you hit your head (even if there is no bleeding).  Some medicines may interact with Xarelto and might  increase your risk of bleeding while on Xarelto. To help avoid this, consult your healthcare provider or pharmacist prior to using any new prescription or non-prescription medications, including herbals, vitamins, non-steroidal anti-inflammatory drugs (NSAIDs) and supplements.  This website has more information on Xarelto: [email or web address]

## 2021-11-29 NOTE — TOC Transition Note (Signed)
Transition of Care [facility]) - CM/SW Discharge Note   Patient Details  Name: Sherry Holloway MRN: [id number] Date of Birth: [birth year]  Transition [facility]) CM/SW Contact:  Emeterio Reeve, LCSW Phone Number: 11/29/2021, 12:33 PM   Clinical Narrative:      Per MD patient ready for DC to [facility]. RN, patient, patient's family, and facility notified of DC. Discharge Summary and FL2 sent to facility. DC packet on chart. Insurance Josem Kaufmann was not required and pt is covid negative. Ambulance transport requested for patient.    RN to call report to [phone].  CSW will sign off for now as social work intervention is no longer needed. Please consult [place] again if new needs arise.   Final next level of care: Skilled Nursing Facility Barriers to Discharge: Barriers Resolved   Patient Goals and CMS Choice Patient states their goals for this hospitalization and ongoing recovery are:: to get stronger then return home CMS Medicare.gov Compare Post Acute Care list provided to:: Patient Choice offered to / list presented to : Patient, Adult Children  Discharge Placement              Patient [facility] at: [facility] Patient to be transferred to facility by: ptar Name of family member notified: daughter Jodette Patient and family notified of of transfer: 11/29/21  Discharge Plan and Services                                     Social Determinants of Health (SDOH) Interventions     Readmission Risk Interventions No flowsheet data found.  Emeterio Reeve, LCSW Clinical Social Worker

## 2021-11-29 NOTE — Care Management Important Message (Signed)
Important Message  Patient Details  Name: Sherry Holloway MRN: [id number] Date of Birth: 10/25/1928   Medicare Important Message Given:  Yes     Hannah Beat 11/29/2021, 11:54 AM

## 2021-11-29 NOTE — Plan of Care (Signed)

## 2021-11-29 NOTE — Discharge Summary (Signed)
Physician Discharge Summary  Makinzi Prieur RXV:[id number] DOB: [birth year] DOA: 11/25/2021  PCP: Ann Held, DO  Admit date: 11/25/2021 Discharge date: 11/29/2021  Admitted From: Home  Disposition:  SNF when bed available today or tomorrow.   Recommendations for Outpatient Follow-up:  Follow up with PCP in 1-2 weeks Please obtain BMP/CBC/LFT  in one week Needs repeat ERCP in 6 weeks due to new Biliary stent, needs to follow up with GI.  Continue with bowel regimen.  Resume Xarelto 12/16    Discharge Condition: Stable.  CODE STATUS: Diet recommendation: Heart Healthy   Brief/Interim Summary: 87 year old with past medical history significant for esophageal cancer status postradiation and Xarelto, uterine cancer, PE on Xarelto, diabetes type 2, OSA, hypertension, hyperlipidemia, choledocholithiasis status post ERCP and cholecystectomy presents with right upper quadrant and epigastric abdominal pain, distention and admitted with recurrent choledocholithiasis measuring about 2.1 cm in the distal CBD noted on CT abdomen and pelvis.  Right upper quadrant ultrasound CBD to 13 mm.  She was a started on IV Zosyn.  She presented with lactic acidosis and leukocytosis, bilirubin 3.2. Patient underwent ERCP 12/13 stone extraction and biliary stent placement.    Discharge Diagnoses:  Principal Problem:   Choledocholithiasis Active Problems:   Hypertension   Type 2 diabetes mellitus with hyperglycemia, without long-term current use of insulin (HCC)   Pulmonary embolus (HCC)   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)   Chronic pain disorder  1-Severe sepsis due to possible cholangitis with choledocholithiasis: POA -Patient presented with leukocytosis, tachypnea and lactic acidosis. -she was transition to Augmentin for 5 days.  LFT trending down.    2-Transaminases hyperbilirubinemia Status post ERCP, stone extraction and stent placement. Liver function tests slowly trending  down     3-Abdominal distention, constipation: Started MiraLAX and Senokot Had BM, feels better. KUB: increase stool through out colon.    4-Esophageal Cancer:  Status postradiation and Xeloda Follow up out patient with oncology.    5-History of PE;  Plan to transition to xarelto when ok by GI.  Discussed with GI, plan to hold heparin Gtt for now. Might be able to resume Xarelto 12/16.   SOB;  Could be related to abdominal distension.  Chest x ray infiltrate vs atelectasis. Already on Antibiotics. Discharge on Augmentin.     NIDDM type 2, hyperglycemia: A1c 8.7 Resume glimepiride at discharge.    Hypomagnesemia/hypokalemia: Replaced.   Lactic acidosis/metabolic acidosis: Resolved   Elevated troponin: Likely demand ischemia.   Normocytic anemia: hb stable.  Glaucoma: Continue with latanoprost. Chronic pain syndrome: Continue with home dose MS Contin Mood disorder: Continue with mirtazapine.      Discharge Instructions  Discharge Instructions     Diet - low sodium heart healthy   Complete by: As directed    Increase activity slowly   Complete by: As directed       Allergies as of 11/29/2021       Reactions   Gramicidin Other (See Comments)   unknown   Sulfa Antibiotics Rash   Wound Dressing Adhesive Rash        Medication List     TAKE these medications    acetaminophen 325 MG tablet Commonly known as: TYLENOL Take 352-650 mg by mouth every 6 (six) hours as needed for mild pain or moderate pain.   amoxicillin-clavulanate 875-125 MG tablet Commonly known as: AUGMENTIN Take 1 tablet by mouth every 12 (twelve) hours for 5 days.   bumetanide 1 MG tablet Commonly known as:  BUMEX Take 1 tablet (1 mg total) by mouth daily. Take 1/2 to 1 tablet daily for swelling What changed:  how much to take when to take this additional instructions   Creon 36000 UNITS Cpep capsule Generic drug: lipase/protease/amylase TAKE 1 CAPSULE THREE TIMES A DAY BEFORE  MEALS What changed: See the new instructions.   docusate sodium 100 MG capsule Commonly known as: Colace Take 1 capsule (100 mg total) by mouth 2 (two) times daily. What changed:  when to take this additional instructions   famciclovir 125 MG tablet Commonly known as: FAMVIR TAKE 1 TABLET DAILY   glimepiride 4 MG tablet Commonly known as: AMARYL Take 4 mg by mouth 2 (two) times daily.   glucose blood test strip Check blood sugars twice daily   latanoprost 0.005 % ophthalmic solution Commonly known as: XALATAN Place 1 drop into both eyes at bedtime.   mirtazapine 15 MG tablet Commonly known as: REMERON Take 1 tablet (15 mg total) by mouth at bedtime. What changed: when to take this   morphine 30 MG 12 hr tablet Commonly known as: MS CONTIN Take 1 tablet (30 mg total) by mouth every 12 (twelve) hours for 3 days.   omeprazole 20 MG capsule Commonly known as: PRILOSEC TAKE 1 CAPSULE TWICE A DAY BEFORE MEALS What changed: See the new instructions.   rivaroxaban 20 MG Tabs tablet Commonly known as: Xarelto Take 1 tablet (20 mg total) by mouth daily with supper. Start taking on: November 30, 2021 What changed: See the new instructions.   senna 8.6 MG Tabs tablet Commonly known as: SENOKOT Take 1 tablet (8.6 mg total) by mouth daily.   valsartan 40 MG tablet Commonly known as: DIOVAN Take 1 tablet (40 mg total) by mouth daily with breakfast.   WOMENS 50+ MULTI VITAMIN/MIN PO Take 1 tablet by mouth daily with supper.        Follow-up Information     [facility] ENDOSCOPY Follow up.   Why: ERCP with [facility] on 02/11/22 at 730 am at [facility] endoscopy w Dr Rush Landmark.  office will call with specific pre-procedure instructions.   Arrival at 6:30 AM that morning.   Dr Mansouraty's office# is [phone]. Contact information: [place] [id number] [place] [address] [phone]               Allergies   Allergen Reactions   Gramicidin Other (See Comments)    unknown   Sulfa Antibiotics Rash   Wound Dressing Adhesive Rash    Consultations: GI  Procedures/Studies: DG Abd 1 View  Result Date: 11/28/2021 CLINICAL DATA:  Shortness of breath, abdominal pain, bloating EXAM: ABDOMEN - 1 VIEW COMPARISON:  Portable exam 1314 hours without priors comparison FINDINGS: Increased stool and scattered gas in colon. Intraspinal stimulator seen. CBD stent present. Surgical clips RIGHT upper quadrant question cholecystectomy. No small bowel dilatation or wall thickening. Bones demineralized with degenerative changes of spine and note of a LEFT hip prosthesis. IMPRESSION: Increased stool throughout colon. Electronically Signed   By: Lavonia Dana M.D.   On: 11/28/2021 13:54   CT ABDOMEN PELVIS W CONTRAST  Result Date: 11/25/2021 CLINICAL DATA:  Abdominal pain, bloating x1 week, constipation EXAM: CT ABDOMEN AND PELVIS WITH CONTRAST TECHNIQUE: Multidetector CT imaging of the abdomen and pelvis was performed using the standard protocol following bolus administration of intravenous contrast. CONTRAST:  192mL OMNIPAQUE IOHEXOL 300 MG/ML  SOLN COMPARISON:  PET-CT dated 04/02/2021. CT pelvis dated 03/15/2021. CT  abdomen dated 01/25/2021. FINDINGS: Lower chest: Scarring in the lung bases. Hepatobiliary: Liver is within normal limits. Moderate intrahepatic and extrahepatic ductal dilatation. Common duct measures 19 mm, similar to the prior. Associated soft tissue in the distal CBD, measuring up to 21 mm (series 5/image 70), likely reflecting choledocholithiasis. Pancreas: Dilated main pancreatic duct measuring up to 13 mm at the ampulla (series 2/image 42). 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 Mild diffuse parenchymal atrophy. Spleen: Within normal limits.  Adrenals/Urinary Tract: Adrenal glands are within normal limits. Kidneys are within normal limits.  No hydronephrosis. Bladder is within normal limits. Stomach/Bowel: Stomach is notable for a tiny hiatal hernia. Duodenal diverticulum (series 2/image 40). No evidence of bowel obstruction. Appendix is not discretely visualized. Sigmoid diverticulosis, without evidence of diverticulitis. Vascular/Lymphatic: No evidence of abdominal aortic aneurysm. Atherosclerotic calcifications of the abdominal aorta and branch vessels. No suspicious abdominopelvic lymphadenopathy. Reproductive: Status post hysterectomy. No adnexal masses. Other: No abdominopelvic ascites. Musculoskeletal: Degenerative changes of the visualized thoracolumbar spine. Left hip arthroplasty, without evidence of complication. Associated streak artifact. 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 Associated pancreatic ductal dilatation and mild diffuse parenchymal atrophy. Moderate intrahepatic and extrahepatic ductal dilatation with suspected choledocholithiasis in the distal common duct measuring up to 2.1 cm. No evidence of bowel obstruction. Sigmoid diverticulosis, without evidence of diverticulitis. Electronically Signed   By: Julian Hy M.D.   On: 11/25/2021 20:07   DG CHEST PORT 1 VIEW  Result Date: 11/28/2021 CLINICAL DATA:  Shortness of breath, abdominal pain, bloating EXAM: PORTABLE CHEST 1 VIEW COMPARISON:  Portable exam 1316 hours compared to 11/25/2021 FINDINGS: Intraspinal stimulator unchanged. Enlargement of cardiac silhouette with slight vascular congestion. Atherosclerotic calcification aorta. Mild atelectasis versus infiltrate at LEFT base. Remaining lungs clear. Tiny LEFT pleural effusion. No pneumothorax. Bones demineralized. IMPRESSION: Enlargement of cardiac silhouette with vascular congestion. Atelectasis versus infiltrate at LEFT base with  tiny LEFT pleural effusion. Aortic Atherosclerosis (ICD10-I70.0). Electronically Signed   By: Lavonia Dana M.D.   On: 11/28/2021 13:34   DG Chest Portable 1 View  Result Date: 11/25/2021 CLINICAL DATA:  Hypoxemia. EXAM: PORTABLE CHEST 1 VIEW COMPARISON:  Chest CT dated 03/15/2021. FINDINGS: There is diffuse interstitial prominence with Kerley B-lines consistent with edema. Minimal left lung base atelectasis. No consolidative changes. There is no pleural effusion pneumothorax. The cardiac silhouette is within limits. Atherosclerotic calcification of the aorta. Degenerative changes of the spine. No acute osseous pathology. Thoracic spine stimulator. IMPRESSION: Interstitial pulmonary edema. Electronically Signed   By: Anner Crete M.D.   On: 11/25/2021 20:52   DG ERCP WITH SPHINCTEROTOMY  Result Date: 11/27/2021 CLINICAL DATA:  87 year old female with choledocholithiasis undergoing ERCP with biliary stent placement EXAM: ERCP TECHNIQUE: Multiple spot images obtained with the fluoroscopic device and submitted for interpretation post-procedure. FLUOROSCOPY TIME:  Fluoroscopy Time:  8 minutes 53 seconds Radiation Exposure Index (if provided by the fluoroscopic device): 123.5 Number of Acquired Spot Images: 0 COMPARISON:  CT abdomen/pelvis and abdominal ultrasound 11/25/2021 FINDINGS: A total of 7 intraoperative spot images are submitted for review. The images demonstrate a flexible duodenal scope in the descending duodenum with wire cannulation of the common bile duct. Cholangiography then demonstrates marked dilation of the common bile duct with several rounded filling defects consistent with choledocholithiasis. Subsequent images demonstrate balloon sphincterotomy and  placement of a plastic biliary stent. IMPRESSION: 1. Choledocholithiasis. 2. ERCP with sphincterotomy, cholangioplasty and biliary stent placement. These images were submitted for radiologic interpretation only. Please see the procedural  report for the amount of contrast and the fluoroscopy time utilized. Electronically Signed   By: Jacqulynn Cadet M.D.   On: 11/27/2021 15:26   DG C-Arm 1-60 Min-No Report  Result Date: 11/27/2021 Fluoroscopy was utilized by the requesting physician.  No radiographic interpretation.   US Abdomen Limited RUQ (LIVER/GB)  Result Date: 11/25/2021 CLINICAL DATA:  Abdominal pain. EXAM: ULTRASOUND ABDOMEN LIMITED RIGHT UPPER QUADRANT COMPARISON:  None. FINDINGS: Gallbladder: Cholecystectomy. Common bile duct: Diameter: 13 mm. The common bile duct is dilated, likely post cholecystectomy. Liver: There is diffuse increased liver echogenicity most commonly seen in the setting of fatty infiltration. Superimposed inflammation or fibrosis is not excluded. Clinical correlation is recommended. Portal vein is patent on color Doppler imaging with normal direction of blood flow towards the liver. Other: None. IMPRESSION: 1. Fatty liver. 2. Cholecystectomy. Electronically Signed   By: Anner Crete M.D.   On: 11/25/2021 20:55     Subjective: Mild abdominal discomfort. Report improvement of abdominal distension. Had BM yesterday   Discharge Exam: Vitals:   11/29/21 0421 11/29/21 0839  BP: (!) 163/50 126/66  Pulse: 61 (!) 59  Resp: 18 16  Temp: 98 F (36.7 C) 97.7 F (36.5 C)  SpO2: 96% 94%     General: Pt is alert, awake, not in acute distress Cardiovascular: RRR, S1/S2 +, no rubs, no gallops Respiratory: CTA bilaterally, no wheezing, no rhonchi Abdominal: Soft, NT, ND, bowel sounds + Extremities: no edema, no cyanosis    The results of significant diagnostics from this hospitalization (including imaging, microbiology, ancillary and laboratory) are listed below for reference.     Microbiology: Recent Results (from the past 240 hour(s))  Resp Panel by RT-PCR (Flu A&B, Covid) Nasopharyngeal Swab     Status: None   Collection Time: 11/25/21  8:15 PM   Specimen: Nasopharyngeal Swab;  Nasopharyngeal(NP) swabs in vial transport medium  Result Value Ref Range Status   SARS Coronavirus 2 by RT PCR NEGATIVE NEGATIVE Final    Comment: (NOTE) SARS-CoV-2 target nucleic acids are NOT DETECTED.  The SARS-CoV-2 RNA is generally detectable in upper respiratory specimens during the acute phase of infection. The lowest concentration of SARS-CoV-2 viral copies this assay can detect is 138 copies/mL. A negative result does not preclude SARS-Cov-2 infection and should not be used as the sole basis for treatment or other patient management decisions. A negative result may occur with  improper specimen collection/handling, submission of specimen other than nasopharyngeal swab, presence of viral mutation(s) within the areas targeted by this assay, and inadequate number of viral copies(<138 copies/mL). A negative result must be combined with clinical observations, patient history, and epidemiological information. The expected result is Negative.  Fact Sheet for Patients:  EntrepreneurPulse.com.au  Fact Sheet for Healthcare Providers:  IncredibleEmployment.be  This test is no t yet approved or cleared by the [place] FDA and  has been authorized for detection and/or diagnosis of SARS-CoV-2 by FDA under an Emergency Use Authorization (EUA). This EUA will remain  in effect (meaning this test can be used) for the duration of the COVID-19 declaration under Section 564(b)(1) of the Act, 21 U.S.C.section 360bbb-3(b)(1), unless the authorization is terminated  or revoked sooner.       Influenza A by PCR NEGATIVE NEGATIVE Final   Influenza B by PCR NEGATIVE NEGATIVE Final  Comment: (NOTE) The Xpert Xpress SARS-CoV-2/FLU/RSV plus assay is intended as an aid in the diagnosis of influenza from Nasopharyngeal swab specimens and should not be used as a sole basis for treatment. Nasal washings and aspirates are unacceptable for Xpert Xpress  SARS-CoV-2/FLU/RSV testing.  Fact Sheet for Patients: EntrepreneurPulse.com.au  Fact Sheet for Healthcare Providers: IncredibleEmployment.be  This test is not yet approved or cleared by the [place] FDA and has been authorized for detection and/or diagnosis of SARS-CoV-2 by FDA under an Emergency Use Authorization (EUA). This EUA will remain in effect (meaning this test can be used) for the duration of the COVID-19 declaration under Section 564(b)(1) of the Act, 21 U.S.C. section 360bbb-3(b)(1), unless the authorization is terminated or revoked.  Performed at [facility], [address] [place], [place] [address]      Labs: BNP (last 3 results) No results for input(s): BNP in the last 8760 hours. Basic Metabolic Panel: Recent Labs  Lab 11/25/21 1832 11/26/21 0623 11/27/21 0155 11/28/21 0418 11/29/21 0325  NA 136 136 133* 133* 132*  K 4.0 3.5 3.4* 3.8 3.7  CL 104 107 104 104 103  CO2 24 19* 19* 22 23  GLUCOSE 273* 289* 204* 188* 170*  BUN 19 17 19  26* 24*  CREATININE 0.85 0.79 0.92 0.92 0.92  CALCIUM 9.2 8.2* 9.1 9.3 8.8*  MG  --   --  1.5* 2.3  --   PHOS  --   --   --  2.5  --    Liver Function Tests: Recent Labs  Lab 11/25/21 1832 11/26/21 0623 11/27/21 0155 11/28/21 0418 11/29/21 0325  AST 501* 529* 144* 41 25  ALT 236* 471* 332* 187* 119*  ALKPHOS 158* 133* 109 116 124  BILITOT 1.7* 3.2* 3.8* 3.4* 2.0*  PROT 6.8 5.5* 5.4* 5.2* 4.8*  ALBUMIN 3.7 2.9* 2.6* 2.0* 1.9*   Recent Labs  Lab 11/25/21 1832 11/26/21 0623 11/28/21 0418  LIPASE 20 21 36   No results for input(s): AMMONIA in the last 168 hours. CBC: Recent Labs  Lab 11/25/21 1832 11/26/21 0623 11/27/21 0155 11/28/21 0418 11/29/21 0325  WBC 9.6 18.5* 19.0* 12.4* 10.2  NEUTROABS 8.6* 17.1* 16.4*  --   --   HGB 12.2 10.4* 10.9* 11.0* 10.0*  HCT 36.4 30.3* 31.8* 33.0* 30.4*  MCV 89.7 88.3 88.6 89.4 88.9  PLT 197 142* 108* 109* 112*    Cardiac Enzymes: No results for input(s): CKTOTAL, CKMB, CKMBINDEX, TROPONINI in the last 168 hours. BNP: Invalid input(s): POCBNP CBG: Recent Labs  Lab 11/28/21 1601 11/28/21 1954 11/29/21 0009 11/29/21 0416 11/29/21 0841  GLUCAP 185* 227* 207* 144* 93   D-Dimer No results for input(s): DDIMER in the last 72 hours. Hgb A1c Recent Labs    11/28/21 0418  HGBA1C 8.6*   Lipid Profile No results for input(s): CHOL, HDL, LDLCALC, TRIG, CHOLHDL, LDLDIRECT in the last 72 hours. Thyroid function studies No results for input(s): TSH, T4TOTAL, T3FREE, THYROIDAB in the last 72 hours.  Invalid input(s): FREET3 Anemia work up No results for input(s): VITAMINB12, FOLATE, FERRITIN, TIBC, IRON, RETICCTPCT in the last 72 hours. Urinalysis    Component Value Date/Time   COLORURINE YELLOW 11/25/2021 2310   APPEARANCEUR CLEAR 11/25/2021 2310   LABSPEC 1.015 11/25/2021 2310   PHURINE 5.0 11/25/2021 2310   GLUCOSEU >=500 (A) 11/25/2021 2310   HGBUR SMALL (A) 11/25/2021 2310   BILIRUBINUR NEGATIVE 11/25/2021 2310   BILIRUBINUR negative 10/19/2020 1440   KETONESUR NEGATIVE  11/25/2021 2310   PROTEINUR 30 (A) 11/25/2021 2310   UROBILINOGEN 0.2 10/19/2020 1440   NITRITE NEGATIVE 11/25/2021 2310   LEUKOCYTESUR TRACE (A) 11/25/2021 2310   Sepsis Labs Invalid input(s): PROCALCITONIN,  WBC,  LACTICIDVEN Microbiology Recent Results (from the past 240 hour(s))  Resp Panel by RT-PCR (Flu A&B, Covid) Nasopharyngeal Swab     Status: None   Collection Time: 11/25/21  8:15 PM   Specimen: Nasopharyngeal Swab; Nasopharyngeal(NP) swabs in vial transport medium  Result Value Ref Range Status   SARS Coronavirus 2 by RT PCR NEGATIVE NEGATIVE Final    Comment: (NOTE) SARS-CoV-2 target nucleic acids are NOT DETECTED.  The SARS-CoV-2 RNA is generally detectable in upper respiratory specimens during the acute phase of infection. The lowest concentration of SARS-CoV-2 viral copies this assay can  detect is 138 copies/mL. A negative result does not preclude SARS-Cov-2 infection and should not be used as the sole basis for treatment or other patient management decisions. A negative result may occur with  improper specimen collection/handling, submission of specimen other than nasopharyngeal swab, presence of viral mutation(s) within the areas targeted by this assay, and inadequate number of viral copies(<138 copies/mL). A negative result must be combined with clinical observations, patient history, and epidemiological information. The expected result is Negative.  Fact Sheet for Patients:  EntrepreneurPulse.com.au  Fact Sheet for Healthcare Providers:  IncredibleEmployment.be  This test is no t yet approved or cleared by the [place] FDA and  has been authorized for detection and/or diagnosis of SARS-CoV-2 by FDA under an Emergency Use Authorization (EUA). This EUA will remain  in effect (meaning this test can be used) for the duration of the COVID-19 declaration under Section 564(b)(1) of the Act, 21 U.S.C.section 360bbb-3(b)(1), unless the authorization is terminated  or revoked sooner.       Influenza A by PCR NEGATIVE NEGATIVE Final   Influenza B by PCR NEGATIVE NEGATIVE Final    Comment: (NOTE) The Xpert Xpress SARS-CoV-2/FLU/RSV plus assay is intended as an aid in the diagnosis of influenza from Nasopharyngeal swab specimens and should not be used as a sole basis for treatment. Nasal washings and aspirates are unacceptable for Xpert Xpress SARS-CoV-2/FLU/RSV testing.  Fact Sheet for Patients: EntrepreneurPulse.com.au  Fact Sheet for Healthcare Providers: IncredibleEmployment.be  This test is not yet approved or cleared by the [place] FDA and has been authorized for detection and/or diagnosis of SARS-CoV-2 by FDA under an Emergency Use Authorization (EUA). This EUA will remain in  effect (meaning this test can be used) for the duration of the COVID-19 declaration under Section 564(b)(1) of the Act, 21 U.S.C. section 360bbb-3(b)(1), unless the authorization is terminated or revoked.  Performed at [facility], [address], [place], [place] [address]      Time coordinating discharge: 40 minutes  SIGNED:   Elmarie Shiley, MD  [facility]

## 2021-12-03 ENCOUNTER — Telehealth: Payer: Self-pay | Admitting: Gastroenterology

## 2021-12-03 ENCOUNTER — Other Ambulatory Visit: Payer: Self-pay

## 2021-12-03 DIAGNOSIS — K8689 Other specified diseases of pancreas: Secondary | ICD-10-CM

## 2021-12-03 DIAGNOSIS — Z4689 Encounter for fitting and adjustment of other specified devices: Secondary | ICD-10-CM

## 2021-12-03 NOTE — Telephone Encounter (Signed)
Inbound call from patient daughter Toula. States she need FMLA paper filled out for her when she had to take care of her mother and miss work. She is faxing the paperwork to [phone]

## 2021-12-05 NOTE — Telephone Encounter (Signed)
As of today 12/05/21 there has been no forms that have been faxed over regarding FMLA.

## 2021-12-12 NOTE — Telephone Encounter (Signed)
FMLA forms have been completed and faxed back to [employer]. Patient Daughter- Astrid Drafts has been informed.

## 2021-12-13 NOTE — Telephone Encounter (Signed)
Document corrected and sent back [employer].

## 2021-12-13 NOTE — Telephone Encounter (Signed)
Inbound call from patient daughter states [employer] states some information on the FLMA forms weren't filled out correctly and will be faxing back for the missing information if you could be on the look out for them,.

## 2021-12-21 ENCOUNTER — Telehealth: Payer: Self-pay | Admitting: Family Medicine

## 2021-12-21 NOTE — Telephone Encounter (Signed)
Pt's daughter states her mom just left [facility]  to go home with two new medications (Farxiga 10mg s and Linzess 145mg ) to help keep her blood sugar level under control. When she went to the pharmacy to pick it up they told her she needs prior auth for both of them since they are new medications. Daughter states her mom needs these medications to help keep her sugars balanced throughout the night. She needs advice on what to do without those medications. Please advice.

## 2021-12-21 NOTE — Telephone Encounter (Signed)
I will have to start PA for these medications. Please advise

## 2021-12-21 NOTE — Telephone Encounter (Signed)
Patients daughter called stating that the [facility] paperwork had been denied by [employer]. Seeking advice, Please advise.

## 2021-12-21 NOTE — Telephone Encounter (Signed)
Sherry Holloway has been working on this.  Thank you

## 2021-12-21 NOTE — Telephone Encounter (Signed)
I have called and left message for Claims specialist [facility] requesting more information as the FMLA form has been faxed x2 with corrected information  requested.I have called daug and let her know as well.

## 2021-12-22 ENCOUNTER — Encounter: Payer: Self-pay | Admitting: Family Medicine

## 2021-12-24 ENCOUNTER — Telehealth: Payer: Self-pay | Admitting: Family Medicine

## 2021-12-24 ENCOUNTER — Telehealth: Payer: Self-pay

## 2021-12-24 MED ORDER — DAPAGLIFLOZIN PROPANEDIOL 10 MG PO TABS: 10.0000 mg | ORAL_TABLET | Freq: Every day | ORAL | 0 refills | Status: DC

## 2021-12-24 MED ORDER — LINACLOTIDE 145 MCG PO CAPS: 145.0000 ug | ORAL_CAPSULE | Freq: Every day | ORAL | 0 refills | Status: DC

## 2021-12-24 NOTE — Telephone Encounter (Signed)
Verbal given 

## 2021-12-24 NOTE — Telephone Encounter (Signed)
Medications were sent to pharmacy.

## 2021-12-24 NOTE — Telephone Encounter (Signed)
Two medications are being requested that are not on med list. Farxiga 10 MG and Linzess. Please advise

## 2021-12-24 NOTE — Telephone Encounter (Signed)
[facility] requesting physical therapy once a week for 7 weeks. [phone]

## 2021-12-24 NOTE — Telephone Encounter (Signed)
PA initiated via Covermymeds; KEY [id number]. PA approved.   [id number];LEXNTZ:[id number];Review Type:Prior Auth;Coverage Start Date:11/24/2021;Coverage End Date:12/24/2022;

## 2021-12-25 ENCOUNTER — Telehealth: Payer: Self-pay | Admitting: *Deleted

## 2021-12-25 ENCOUNTER — Encounter: Payer: Self-pay | Admitting: Family Medicine

## 2021-12-25 NOTE — Telephone Encounter (Signed)
Prior auth started via cover my meds.  Awaiting determination.  Key: Sherry Holloway

## 2021-12-25 NOTE — Telephone Encounter (Signed)
Here are the pharmacy alternatives:   empagliflozin (Jardiance), empagliflozin/metformin (Synjardy, Synjardy XR) and empagliflozin/linagliptin Judye Bos) are DoD's preferred SGLT2 inhibitor, and that PA is not required for empagliflozin.  Would you like to change?

## 2021-12-25 NOTE — Telephone Encounter (Signed)
Prior auth started anyway, but alternatives are listed below if we get denial.

## 2021-12-26 NOTE — Telephone Encounter (Signed)
Patients daughter called and stated that [employer] has not received FMLA paperwork. Please advise.

## 2021-12-26 NOTE — Telephone Encounter (Signed)
Farxiga d/c. Per Dr. Etter Sjogren- okay to start Jardiance 10mg  as received from pharmacy. Med list updated.

## 2021-12-26 NOTE — Telephone Encounter (Signed)
I do not see where sliding scale insulin was sent from 12/21/21 telephone note. Per this mychart message- Pt received Jardiance 10mg  instead of Wilder Glade (we are waiting for PA determination on Farxiga)- is it okay for her to just stay on Jardiance?

## 2021-12-27 NOTE — Telephone Encounter (Signed)
Patients daughter called stating that [employer] needs a start date and end date. Requesting 1 year. Please advise.

## 2021-12-27 NOTE — Telephone Encounter (Signed)
RTN call to daug- Jodette letting her know that I have completed form again. I have re-faxed it to Dudley at [facility] and recd confirmation. Daug will let me know if she needs anything further.

## 2021-12-28 NOTE — Telephone Encounter (Signed)
Form has been updated again. Phone call again placed to [facility] specialist asking that if there is something needed further please contact office.

## 2021-12-31 ENCOUNTER — Other Ambulatory Visit: Payer: Self-pay | Admitting: *Deleted

## 2021-12-31 DIAGNOSIS — C154 Malignant neoplasm of middle third of esophagus: Secondary | ICD-10-CM

## 2022-01-01 ENCOUNTER — Encounter: Payer: Self-pay | Admitting: Family Medicine

## 2022-01-01 ENCOUNTER — Inpatient Hospital Stay: Payer: Medicare Other | Attending: Oncology

## 2022-01-01 ENCOUNTER — Ambulatory Visit: Payer: Medicare Other | Attending: Internal Medicine

## 2022-01-01 ENCOUNTER — Inpatient Hospital Stay (HOSPITAL_BASED_OUTPATIENT_CLINIC_OR_DEPARTMENT_OTHER): Payer: Medicare Other | Admitting: Oncology

## 2022-01-01 ENCOUNTER — Other Ambulatory Visit: Payer: Self-pay

## 2022-01-01 ENCOUNTER — Ambulatory Visit (INDEPENDENT_AMBULATORY_CARE_PROVIDER_SITE_OTHER): Payer: Medicare Other | Admitting: Family Medicine

## 2022-01-01 VITALS — BP 138/60 | HR 72 | Temp 99.0°F | Resp 18 | Ht 64.0 in | Wt 158.6 lb

## 2022-01-01 VITALS — BP 153/50 | HR 69 | Temp 97.8°F | Resp 18 | Ht 64.0 in | Wt 156.6 lb

## 2022-01-01 DIAGNOSIS — K219 Gastro-esophageal reflux disease without esophagitis: Secondary | ICD-10-CM | POA: Diagnosis not present

## 2022-01-01 DIAGNOSIS — Z7901 Long term (current) use of anticoagulants: Secondary | ICD-10-CM | POA: Diagnosis not present

## 2022-01-01 DIAGNOSIS — I10 Essential (primary) hypertension: Secondary | ICD-10-CM | POA: Insufficient documentation

## 2022-01-01 DIAGNOSIS — G8929 Other chronic pain: Secondary | ICD-10-CM | POA: Insufficient documentation

## 2022-01-01 DIAGNOSIS — G4733 Obstructive sleep apnea (adult) (pediatric): Secondary | ICD-10-CM | POA: Diagnosis not present

## 2022-01-01 DIAGNOSIS — C154 Malignant neoplasm of middle third of esophagus: Secondary | ICD-10-CM

## 2022-01-01 DIAGNOSIS — I2699 Other pulmonary embolism without acute cor pulmonale: Secondary | ICD-10-CM | POA: Diagnosis not present

## 2022-01-01 DIAGNOSIS — C159 Malignant neoplasm of esophagus, unspecified: Secondary | ICD-10-CM | POA: Insufficient documentation

## 2022-01-01 DIAGNOSIS — E1165 Type 2 diabetes mellitus with hyperglycemia: Secondary | ICD-10-CM

## 2022-01-01 DIAGNOSIS — K862 Cyst of pancreas: Secondary | ICD-10-CM | POA: Diagnosis not present

## 2022-01-01 DIAGNOSIS — K8050 Calculus of bile duct without cholangitis or cholecystitis without obstruction: Secondary | ICD-10-CM

## 2022-01-01 DIAGNOSIS — Z8542 Personal history of malignant neoplasm of other parts of uterus: Secondary | ICD-10-CM | POA: Diagnosis not present

## 2022-01-01 DIAGNOSIS — E1169 Type 2 diabetes mellitus with other specified complication: Secondary | ICD-10-CM

## 2022-01-01 DIAGNOSIS — I771 Stricture of artery: Secondary | ICD-10-CM | POA: Insufficient documentation

## 2022-01-01 DIAGNOSIS — Z23 Encounter for immunization: Secondary | ICD-10-CM

## 2022-01-01 DIAGNOSIS — E785 Hyperlipidemia, unspecified: Secondary | ICD-10-CM

## 2022-01-01 DIAGNOSIS — K5901 Slow transit constipation: Secondary | ICD-10-CM

## 2022-01-01 DIAGNOSIS — E119 Type 2 diabetes mellitus without complications: Secondary | ICD-10-CM | POA: Insufficient documentation

## 2022-01-01 LAB — CBC WITH DIFFERENTIAL (CANCER CENTER ONLY)
Abs Immature Granulocytes: 0.02 10*3/uL (ref 0.00–0.07)
Basophils Absolute: 0 10*3/uL (ref 0.0–0.1)
Basophils Relative: 1 %
Eosinophils Absolute: 0.1 10*3/uL (ref 0.0–0.5)
Eosinophils Relative: 1 %
HCT: 36.3 % (ref 36.0–46.0)
Hemoglobin: 11.5 g/dL — ABNORMAL LOW (ref 12.0–15.0)
Immature Granulocytes: 0 %
Lymphocytes Relative: 27 %
Lymphs Abs: 1.9 10*3/uL (ref 0.7–4.0)
MCH: 27.8 pg (ref 26.0–34.0)
MCHC: 31.7 g/dL (ref 30.0–36.0)
MCV: 87.7 fL (ref 80.0–100.0)
Monocytes Absolute: 0.4 10*3/uL (ref 0.1–1.0)
Monocytes Relative: 6 %
Neutro Abs: 4.6 10*3/uL (ref 1.7–7.7)
Neutrophils Relative %: 65 %
Platelet Count: 209 10*3/uL (ref 150–400)
RBC: 4.14 MIL/uL (ref 3.87–5.11)
RDW: 14.1 % (ref 11.5–15.5)
WBC Count: 7 10*3/uL (ref 4.0–10.5)
nRBC: 0 % (ref 0.0–0.2)

## 2022-01-01 LAB — CMP (CANCER CENTER ONLY)
ALT: 20 U/L (ref 0–44)
AST: 15 U/L (ref 15–41)
Albumin: 3.8 g/dL (ref 3.5–5.0)
Alkaline Phosphatase: 154 U/L — ABNORMAL HIGH (ref 38–126)
Anion gap: 10 (ref 5–15)
BUN: 22 mg/dL (ref 8–23)
CO2: 25 mmol/L (ref 22–32)
Calcium: 9.7 mg/dL (ref 8.9–10.3)
Chloride: 103 mmol/L (ref 98–111)
Creatinine: 1.02 mg/dL — ABNORMAL HIGH (ref 0.44–1.00)
GFR, Estimated: 52 mL/min — ABNORMAL LOW (ref >60)
Glucose, Bld: 119 mg/dL — ABNORMAL HIGH (ref 70–99)
Potassium: 4 mmol/L (ref 3.5–5.1)
Sodium: 138 mmol/L (ref 135–145)
Total Bilirubin: 0.5 mg/dL (ref 0.3–1.2)
Total Protein: 7 g/dL (ref 6.5–8.1)

## 2022-01-01 MED ORDER — EMPAGLIFLOZIN 10 MG PO TABS: 10.0000 mg | ORAL_TABLET | Freq: Every day | ORAL | 1 refills | Status: DC

## 2022-01-01 MED ORDER — LINACLOTIDE 145 MCG PO CAPS: 145.0000 ug | ORAL_CAPSULE | Freq: Every day | ORAL | 1 refills | Status: DC

## 2022-01-01 NOTE — Progress Notes (Signed)
Subjective:   By signing my name below, I, [name], attest that this documentation has been prepared under the direction and in the presence of Dr. Roma Schanz, DO. 01/01/2022    Patient ID: Sherry Holloway, female    DOB: 23-Nov-1928, 88 y.o.   MRN: [id number]  Chief Complaint  Patient presents with   Hospitalization Follow-up    Choledocholithiasis, Pt states feeling better and states not having any pain.     HPI Patient is in today for a hospital follow up. Her daughter is present with her during this visit.  She was admitted to the hospital on 12/26/2021 and was discharged on 12/30/2021.  Her blood sugars are measuring normal at home. She continues taking Jardiance and her daughter reports her blood sugars have improved since she started taking it.   Biliary stent was placed during this hospital stay.  He has an ercp scheduled for next month to remove stent.   Lab Results  Component Value Date   HGBA1C 8.6 (H) 11/28/2021   She is requesting a refill for 145 mcg linzess daily PO, 10 mg Jardiance daily PO.  Her daughter reports she is UTD on flu vaccines this year and received it at her pharmacy. She is eligible for the shingles and is not interested in receiving it. She is due for a tetanus vaccine and is not interested in receiving it at this time. She was informed of the bivalent Covid-19 vaccine and is interested in receiving it after this visit.    Past Medical History:  Diagnosis Date   Arthritis    Choledocholithiasis    Diabetes mellitus without complication ([place])    Diverticulitis    Esophageal cancer ([place]) dx'd 2020   GERD (gastroesophageal reflux disease)    Hyperlipidemia    Hypertension    OSA (obstructive sleep apnea)    Urine incontinence    Uterine cancer [facility])     Past Surgical History:  Procedure Laterality Date   ABDOMINAL HYSTERECTOMY  1972   APPENDECTOMY     BILIARY DILATION  03/05/2021   Procedure: BILIARY DILATION;  Surgeon:  Irving Copas., MD;  Location: [facility];  Service: Gastroenterology;;   BILIARY DILATION  11/27/2021   Procedure: BILIARY DILATION;  Surgeon: [name], MD;  Location: [place];  Service: Endoscopy;;   BILIARY STENT PLACEMENT  11/27/2021   Procedure: BILIARY STENT PLACEMENT;  Surgeon: [name], MD;  Location: [place];  Service: Endoscopy;;   BIOPSY  03/05/2021   Procedure: BIOPSY;  Surgeon: Irving Copas., MD;  Location: [facility];  Service: Gastroenterology;;   BIOPSY  11/27/2021   Procedure: BIOPSY;  Surgeon: [name], MD;  Location: [facility] ENDOSCOPY;  Service: Endoscopy;;   CHOLECYSTECTOMY     ENDOSCOPIC RETROGRADE CHOLANGIOPANCREATOGRAPHY (ERCP) WITH PROPOFOL N/A 03/05/2021   Procedure: ENDOSCOPIC RETROGRADE CHOLANGIOPANCREATOGRAPHY (ERCP) WITH PROPOFOL;  Surgeon: Irving Copas., MD;  Location: [facility];  Service: Gastroenterology;  Laterality: N/A;   ENDOSCOPIC RETROGRADE CHOLANGIOPANCREATOGRAPHY (ERCP) WITH PROPOFOL N/A 11/27/2021   Procedure: ENDOSCOPIC RETROGRADE CHOLANGIOPANCREATOGRAPHY (ERCP) WITH PROPOFOL;  Surgeon: [name], MD;  Location: [facility] ENDOSCOPY;  Service: Endoscopy;  Laterality: N/A;  possible EGD prior   ESOPHAGOGASTRODUODENOSCOPY N/A 11/27/2021   Procedure: ESOPHAGOGASTRODUODENOSCOPY (EGD);  Surgeon: [name], MD;  Location: [facility] ENDOSCOPY;  Service: Endoscopy;  Laterality: N/A;   ESOPHAGOGASTRODUODENOSCOPY (EGD) WITH PROPOFOL N/A 03/05/2021   Procedure: ESOPHAGOGASTRODUODENOSCOPY (EGD) WITH PROPOFOL;  Surgeon: Rush Landmark Telford Nab., MD;  Location: [facility] ENDOSCOPY;  Service:  Gastroenterology;  Laterality: N/A;   EUS N/A 03/05/2021   Procedure: UPPER ENDOSCOPIC ULTRASOUND (EUS) RADIAL;  Surgeon: Irving Copas., MD;  Location: [facility] ENDOSCOPY;  Service: Gastroenterology;  Laterality: N/A;   LUMBAR LAMINECTOMY     REMOVAL OF STONES  03/05/2021   Procedure: REMOVAL OF STONES;  Surgeon: Rush Landmark  Telford Nab., MD;  Location: [facility];  Service: Gastroenterology;;   REMOVAL OF STONES  11/27/2021   Procedure: REMOVAL OF STONES;  Surgeon: [name], MD;  Location: [facility] ENDOSCOPY;  Service: Endoscopy;;   SPINAL CORD STIMULATOR IMPLANT     SPYGLASS CHOLANGIOSCOPY N/A 03/05/2021   Procedure: [id number] CHOLANGIOSCOPY;  Surgeon: Irving Copas., MD;  Location: [facility];  Service: Gastroenterology;  Laterality: N/A;   SPYGLASS LITHOTRIPSY N/A 03/05/2021   Procedure: YNWGNFAO LITHOTRIPSY;  Surgeon: Irving Copas., MD;  Location: [facility];  Service: Gastroenterology;  Laterality: N/A;   TONSILLECTOMY     TOTAL KNEE ARTHROPLASTY Right     Family History  Problem Relation Age of Onset   Diabetes Mother    Heart disease Father    Lung cancer Sister    Lung cancer Son    Hypertension Son    Hypertension Daughter    Diabetes Son    Hypertension Son    Diabetes Daughter    Hypertension Daughter     Social History   Socioeconomic History   Marital status: Widowed    Spouse name: Not on file   Number of children: 4   Years of education: Not on file   Highest education level: Not on file  Occupational History   Occupation: Retired Quarry manager  Tobacco Use   Smoking status: Former    Packs/day: 1.00    Years: 35.00    Pack years: 35.00    Types: Cigarettes    Quit date: 01/17/1989    Years since quitting: 32.9   Smokeless tobacco: Never  Vaping Use   Vaping Use: Never used  Substance and Sexual Activity   Alcohol use: Not Currently   Drug use: Never   Sexual activity: Not Currently  Other Topics Concern   Not on file  Social History Narrative   Just moved from [place]--- and moved into an apt 2.4 miles from her daughter    Social Determinants of Health   Financial Resource Strain: Not on file  Food Insecurity: Not on file  Transportation Needs: Not on file  Physical Activity: Not on file  Stress: Not on file  Social Connections: Not on file  Intimate  Partner Violence: Not At Risk   Fear of Current or Ex-Partner: No   Emotionally Abused: No   Physically Abused: No   Sexually Abused: No    Outpatient Medications Prior to Visit  Medication Sig Dispense Refill   acetaminophen (TYLENOL) 325 MG tablet Take 352-650 mg by mouth every 6 (six) hours as needed for mild pain or moderate pain.     bumetanide (BUMEX) 1 MG tablet Take 1 tablet (1 mg total) by mouth daily. Take 1/2 to 1 tablet daily for swelling (Patient taking differently: Take 0.5 mg by mouth See admin instructions. 0.5mg  oral daily And 0.5mg  oral extra daily as needed for swelling) 90 tablet 1   docusate sodium (COLACE) 100 MG capsule Take 1 capsule (100 mg total) by mouth 2 (two) times daily. (Patient taking differently: Take 100 mg by mouth See admin instructions. 100mg  oral daily alternating with 100mg  oral twice daily, repeat.) 10 capsule 0   famciclovir (  FAMVIR) 125 MG tablet TAKE 1 TABLET DAILY 90 tablet 3   glimepiride (AMARYL) 4 MG tablet Take 4 mg by mouth 2 (two) times daily.     glucose blood test strip Check blood sugars twice daily 200 each 12   latanoprost (XALATAN) 0.005 % ophthalmic solution Place 1 drop into both eyes at bedtime. 2.5 mL 2   lipase/protease/amylase (CREON) 36000 UNITS CPEP capsule TAKE 1 CAPSULE THREE TIMES A DAY BEFORE MEALS (Patient taking differently: 36,000 Units 2 (two) times daily.) 270 capsule 1   mirtazapine (REMERON) 15 MG tablet Take 1 tablet (15 mg total) by mouth at bedtime. (Patient taking differently: Take 15 mg by mouth daily with supper.) 30 tablet 0   Multiple Vitamins-Minerals (WOMENS 50+ MULTI VITAMIN/MIN PO) Take 1 tablet by mouth daily with supper.     omeprazole (PRILOSEC) 20 MG capsule TAKE 1 CAPSULE TWICE A DAY BEFORE MEALS (Patient taking differently: Take 20 mg by mouth 2 (two) times daily before a meal.) 180 capsule 1   rivaroxaban (XARELTO) 20 MG TABS tablet Take 1 tablet (20 mg total) by mouth daily with supper. 90 tablet 3    senna (SENOKOT) 8.6 MG TABS tablet Take 1 tablet (8.6 mg total) by mouth daily. 120 tablet 0   valsartan (DIOVAN) 40 MG tablet Take 1 tablet (40 mg total) by mouth daily with breakfast. 90 tablet 1   empagliflozin (JARDIANCE) 10 MG TABS tablet Take 1 tablet (10 mg total) by mouth daily before breakfast.     linaclotide (LINZESS) 145 MCG CAPS capsule Take 1 capsule (145 mcg total) by mouth daily before breakfast. 30 capsule 0   No facility-administered medications prior to visit.    Allergies  Allergen Reactions   Gramicidin Other (See Comments)    unknown   Sulfa Antibiotics Rash   Wound Dressing Adhesive Rash    Review of Systems  Constitutional:  Negative for fever and malaise/fatigue.  HENT:  Negative for congestion.   Eyes:  Negative for blurred vision.  Respiratory:  Negative for shortness of breath.   Cardiovascular:  Negative for chest pain, palpitations and leg swelling.  Gastrointestinal:  Negative for abdominal pain, blood in stool and nausea.  Genitourinary:  Negative for dysuria and frequency.  Musculoskeletal:  Negative for falls.  Skin:  Negative for rash.  Neurological:  Negative for dizziness, loss of consciousness and headaches.  Endo/Heme/Allergies:  Negative for environmental allergies.  Psychiatric/Behavioral:  Negative for depression. The patient is not nervous/anxious.       Objective:    Physical Exam Vitals and nursing note reviewed.  Constitutional:      General: She is not in acute distress.    Appearance: Normal appearance. She is well-developed. She is not ill-appearing.  HENT:     Head: Normocephalic and atraumatic.     Right Ear: External ear normal.     Left Ear: External ear normal.  Eyes:     Extraocular Movements: Extraocular movements intact.     Conjunctiva/sclera: Conjunctivae normal.     Pupils: Pupils are equal, round, and reactive to light.  Neck:     Thyroid: No thyromegaly.     Vascular: No carotid bruit or JVD.   Cardiovascular:     Rate and Rhythm: Normal rate and regular rhythm.     Heart sounds: Normal heart sounds. No murmur heard.   No gallop.  Pulmonary:     Effort: Pulmonary effort is normal. No respiratory distress.     Breath sounds: Normal  breath sounds. No wheezing or rales.  Chest:     Chest wall: No tenderness.  Musculoskeletal:     Cervical back: Normal range of motion and neck supple.  Skin:    General: Skin is warm and dry.  Neurological:     Mental Status: She is alert and oriented to person, place, and time.  Psychiatric:        Behavior: Behavior normal.        Judgment: Judgment normal.    BP 138/60 (BP Location: Right Arm, Patient Position: Sitting, Cuff Size: Normal)    Pulse 72    Temp 99 F (37.2 C) (Oral)    Resp 18    Ht 5\' 4"  (1.626 m)    Wt 158 lb 9.6 oz (71.9 kg)    SpO2 95%    BMI 27.22 kg/m  Wt Readings from Last 3 Encounters:  01/01/22 158 lb 9.6 oz (71.9 kg)  11/27/21 164 lb 0.4 oz (74.4 kg)  10/01/21 166 lb (75.3 kg)    Diabetic Foot Exam - Simple   No data filed    Lab Results  Component Value Date   WBC 10.2 11/29/2021   HGB 10.0 (L) 11/29/2021   HCT 30.4 (L) 11/29/2021   PLT 112 (L) 11/29/2021   GLUCOSE 170 (H) 11/29/2021   CHOL 137 08/06/2021   TRIG 138.0 08/06/2021   HDL 57.50 08/06/2021   LDLCALC 52 08/06/2021   ALT 119 (H) 11/29/2021   AST 25 11/29/2021   NA 132 (L) 11/29/2021   K 3.7 11/29/2021   CL 103 11/29/2021   CREATININE 0.92 11/29/2021   BUN 24 (H) 11/29/2021   CO2 23 11/29/2021   INR 1.3 (H) 11/26/2021   HGBA1C 8.6 (H) 11/28/2021   MICROALBUR 4.8 (H) 04/20/2021    No results found for: TSH Lab Results  Component Value Date   WBC 10.2 11/29/2021   HGB 10.0 (L) 11/29/2021   HCT 30.4 (L) 11/29/2021   MCV 88.9 11/29/2021   PLT 112 (L) 11/29/2021   Lab Results  Component Value Date   NA 132 (L) 11/29/2021   K 3.7 11/29/2021   CO2 23 11/29/2021   GLUCOSE 170 (H) 11/29/2021   BUN 24 (H) 11/29/2021    CREATININE 0.92 11/29/2021   BILITOT 2.0 (H) 11/29/2021   ALKPHOS 124 11/29/2021   AST 25 11/29/2021   ALT 119 (H) 11/29/2021   PROT 4.8 (L) 11/29/2021   ALBUMIN 1.9 (L) 11/29/2021   CALCIUM 8.8 (L) 11/29/2021   ANIONGAP 6 11/29/2021   GFR 49.96 (L) 04/20/2021   Lab Results  Component Value Date   CHOL 137 08/06/2021   Lab Results  Component Value Date   HDL 57.50 08/06/2021   Lab Results  Component Value Date   LDLCALC 52 08/06/2021   Lab Results  Component Value Date   TRIG 138.0 08/06/2021   Lab Results  Component Value Date   CHOLHDL 2 08/06/2021   Lab Results  Component Value Date   HGBA1C 8.6 (H) 11/28/2021       Assessment & Plan:   Problem List Items Addressed This Visit       Unprioritized   Acute pulmonary embolism ([place])    Stable con't eliquis       Cancer of middle third of esophagus ([place])    Per gi and oncology      Choledocholithiasis    Ercp next month to remove stent       Hyperlipidemia  Encourage heart healthy diet such as MIND or DASH diet, increase exercise, avoid trans fats, simple carbohydrates and processed foods, consider a krill or fish or flaxseed oil cap daily.       Hypertension    Well controlled, no changes to meds. Encouraged heart healthy diet such as the DASH diet and exercise as tolerated.       Type 2 diabetes mellitus with hyperglycemia, without long-term current use of insulin (HCC) - Primary    Sugars are coming down since the jardiance was started  Recheck labs in march/ April       Relevant Medications   empagliflozin (JARDIANCE) 10 MG TABS tablet   Other Visit Diagnoses     Slow transit constipation       Relevant Medications   linaclotide (LINZESS) 145 MCG CAPS capsule   Hyperlipidemia associated with type 2 diabetes mellitus (HCC)       Relevant Medications   empagliflozin (JARDIANCE) 10 MG TABS tablet        Meds ordered this encounter  Medications   linaclotide (LINZESS) 145 MCG CAPS  capsule    Sig: Take 1 capsule (145 mcg total) by mouth daily before breakfast.    Dispense:  90 capsule    Refill:  1   empagliflozin (JARDIANCE) 10 MG TABS tablet    Sig: Take 1 tablet (10 mg total) by mouth daily before breakfast.    Dispense:  90 tablet    Refill:  1    I, Dr. Roma Schanz, DO, personally preformed the services described in this documentation.  All medical record entries made by the scribe were at my direction and in my presence.  I have reviewed the chart and discharge instructions (if applicable) and agree that the record reflects my personal performance and is accurate and complete. 01/01/2022   I,[name],acting as a scribe for Ann Held, DO.,have documented all relevant documentation on the behalf of Ann Held, DO,as directed by  Ann Held, DO while in the presence of Ann Held, DO.   Ann Held, DO

## 2022-01-01 NOTE — Progress Notes (Signed)
[place] OFFICE PROGRESS NOTE   Diagnosis: Esophagus cancer  INTERVAL HISTORY:   Sherry Holloway returns as scheduled.  She feels well.  No dysphagia.  She was admitted in December with abdominal pain.  She was diagnosed with choledocholithiasis.  She underwent a CT abdomen pelvis 11/25/2021.  This revealed a 3.4 cm cystic lesion in the pancreas head/uncinate.  There was moderate intrahepatic and extrapelvic ductal dilatation with choledocholithiasis in the distal common duct measuring 2.1 cm.  She underwent an ERCP by Dr. Ardis Hughs and underwent removal of stone fragments from the bile duct.  A plastic stent was placed.  There was no evidence of remaining cancer in the esophagus.  She went to a skilled nursing facility following discharge from the hospital.  She is now living with her daughter.  Objective:  Vital signs in last 24 hours:  Blood pressure (!) 153/50, pulse 69, temperature 97.8 F (36.6 C), temperature source Oral, resp. rate 18, height 5\' 4"  (1.626 m), weight 156 lb 9.6 oz (71 kg), SpO2 98 %.    HEENT: Neck without mass Lymphatics: No cervical, supraclavicular, axillary, or inguinal nodes Resp: Lungs clear bilaterally Cardio: Regular rate and rhythm GI: No hepatosplenomegaly, no mass, nontender Vascular: No leg edema, the right lower leg is larger than the left side   Lab Results:  Lab Results  Component Value Date   WBC 7.0 01/01/2022   HGB 11.5 (L) 01/01/2022   HCT 36.3 01/01/2022   MCV 87.7 01/01/2022   PLT 209 01/01/2022   NEUTROABS 4.6 01/01/2022    CMP  Lab Results  Component Value Date   NA 132 (L) 11/29/2021   K 3.7 11/29/2021   CL 103 11/29/2021   CO2 23 11/29/2021   GLUCOSE 170 (H) 11/29/2021   BUN 24 (H) 11/29/2021   CREATININE 0.92 11/29/2021   CALCIUM 8.8 (L) 11/29/2021   PROT 4.8 (L) 11/29/2021   ALBUMIN 1.9 (L) 11/29/2021   AST 25 11/29/2021   ALT 119 (H) 11/29/2021   ALKPHOS 124 11/29/2021   BILITOT 2.0 (H)  11/29/2021   GFRNONAA 59 (L) 11/29/2021      Medications: I have reviewed the patient's current medications.   Assessment/Plan:  Esophagus cancer Upper EUS 03/05/2021-small submucosal ulcerating nodule with no bleeding and no stigmata of recent bleeding in the midesophagus, 29 to 31 cm from the incisors.  Lesion nonobstructing and not circumferential.  Patchy mildly erythematous mucosa without bleeding was found in the gastric body and gastric antrum.  The esophagus lesion was staged T1sm N0 MX.  Pancreatic parenchymal abnormalities consisting of atrophy were noted in the entire pancreas.  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  There was dilation of the common bile duct.  No malignant appearing lymph nodes were visualized in the middle paraesophageal mediastinum, lower paraesophageal mediastinum, celiac region, peripancreatic region and porta hepatis region.  Esophagus biopsy showed invasive carcinoma.  Stomach biopsy showed reactive gastropathy; no intestinal metaplasia, dysplasia or malignancy. PET 04/02/2021- low-level hypermetabolism associated with a 0.7 cm nodule at the mid thoracic esophagus, hypermetabolic cystic pancreas head mass with diffuse intrahepatic and extra fatigability duct dilatation compatible with malignant main duct IPMN, no evidence of metastatic disease CT chest and pelvis 03/16/2021-nonocclusive right lower lobe pulmonary embolus extending into segmental branches of the right lower lobe.  Nodular area at the left lung base  with bandlike changes that extend from this area on the current study and on the prior study.  This may represent a combination of scarring and atelectasis.  Measures 1.4 x 1.2 cm, previously 2.1 x 1.9 cm.  Suggestion of slight interval decrease in size as well of a right lower lobe process with  nodular features.  Persistent marked biliary duct distention. Radiation/Xeloda 07/26/2021-09/04/2021 Endoscopy at the time of ERCP 11/27/2021-no residual tumor seen CT scan of the abdomen on 01/25/2021 due to history of a pancreatic mass.  The CT showed gross intra and extrahepatic biliary ductal dilatation, the central common bile duct appeared to contain a poorly calcified calculus or soft tissue nodule measuring approximately 1.9 cm.  There was diffuse atrophy of the pancreatic parenchyma and dilatation of the main pancreatic duct.  There was a hypodense mass or gross pancreatic ductal dilatation within the pancreatic head measuring 3.5 x 2.0 x 2.0 cm.  Nodular appearing consolidation of the dependent left lung base measuring 2.4 x 1.8 cm, nonspecific. ERCP by Dr. Rush Landmark on 03/05/2021.  The major papilla was located entirely within a pantaloon diverticulum.  Prior biliary sphincterotomy appeared open.  Pancreatic duct fishmouth deformity noted.  Filling defects consistent with stones and sludge were seen on the cholangiogram.  The entire main bile duct was severely dilated.  Choledocholithiasis was found.  Biliary sphincteroplasty performed.  Electrohydraulic lithotripsy of a remaining stone performed.   Pulmonary embolus on chest CT 03/15/2021-on Xarelto.  Bilateral lower extremity venous Doppler study with no significant femoral-popliteal DVT.  Limited assessment of the calf veins.  5.7 cm left popliteal fossa Baker's cyst. GERD Hypertension Diabetes OSA Subclavian arterial stenosis History of uterine cancer Chronic back pain maintained on MS Contin, followed by Dr. Nelva Bush IPMN- diagnosed in July 2020 Biliary stones-status post ERCP/sphincterotomy 07/14/2019 for removal of stones Admission 11/25/2021 with recurrent choledocholithiasis, status post ERCP and stone removal 11/27/2021, bile duct stent placed       Disposition: Sherry Holloway is in remission from esophagus cancer.  She will  continue follow-up with Dr. Rush Landmark for management of choledocholithiasis and the IPMN.  She will return for an office visit in 6 months.  Betsy Coder, MD  01/01/2022  3:00 PM

## 2022-01-01 NOTE — Assessment & Plan Note (Signed)
Encourage heart healthy diet such as MIND or DASH diet, increase exercise, avoid trans fats, simple carbohydrates and processed foods, consider a krill or fish or flaxseed oil cap daily.  °

## 2022-01-01 NOTE — Assessment & Plan Note (Signed)
Ercp next month to remove stent

## 2022-01-01 NOTE — Assessment & Plan Note (Signed)
Sugars are coming down since the jardiance was started  [facility] labs in [facility] April

## 2022-01-01 NOTE — Patient Instructions (Signed)

## 2022-01-01 NOTE — Progress Notes (Signed)
° °  Covid-19 Vaccination Clinic  Name:  Sherry Holloway    MRN: [id number] DOB: Mar 07, 1928  01/01/2022  [name] was observed post Covid-19 immunization for 15 minutes without incident. She was provided with Vaccine Information Sheet and instruction to access the V-Safe system.   [name] was instructed to call 911 with any severe reactions post vaccine: Difficulty breathing  Swelling of face and throat  A fast heartbeat  A bad rash all over body  Dizziness and weakness   Immunizations Administered     Name Date Dose VIS Date Route   Pfizer Covid-19 Vaccine Bivalent Booster 01/01/2022 12:07 PM 0.3 mL 08/15/2021 Intramuscular   Manufacturer: [place]   Lot: [id number]   [place]: [phone]

## 2022-01-01 NOTE — Assessment & Plan Note (Signed)
Per gi and oncology

## 2022-01-01 NOTE — Assessment & Plan Note (Signed)
Well controlled, no changes to meds. Encouraged heart healthy diet such as the DASH diet and exercise as tolerated.  °

## 2022-01-01 NOTE — Assessment & Plan Note (Signed)
Stable con't eliquis

## 2022-01-03 ENCOUNTER — Other Ambulatory Visit (HOSPITAL_BASED_OUTPATIENT_CLINIC_OR_DEPARTMENT_OTHER): Payer: Self-pay

## 2022-01-03 MED ORDER — PFIZER COVID-19 VAC BIVALENT 30 MCG/0.3ML IM SUSP
INTRAMUSCULAR | 0 refills | Status: DC
  Filled 2022-01-03: qty 0.3, 1d supply, fill #0

## 2022-01-09 ENCOUNTER — Encounter: Payer: Self-pay | Admitting: Family Medicine

## 2022-01-09 MED ORDER — GLUCOSE BLOOD VI STRP: ORAL_STRIP | 12 refills | Status: DC

## 2022-01-20 ENCOUNTER — Other Ambulatory Visit: Payer: Self-pay | Admitting: Family Medicine

## 2022-02-01 ENCOUNTER — Encounter (HOSPITAL_COMMUNITY): Payer: Self-pay | Admitting: Gastroenterology

## 2022-02-01 NOTE — Telephone Encounter (Signed)
The pts daughter has been advised to have the pt hold xarelto 2 days prior to her upcoming procedure.

## 2022-02-01 NOTE — Progress Notes (Signed)
Attempted to obtain medical history via telephone, unable to reach at this time. I left a voicemail to return pre surgical testing department's phone call.  

## 2022-02-07 ENCOUNTER — Other Ambulatory Visit (INDEPENDENT_AMBULATORY_CARE_PROVIDER_SITE_OTHER): Payer: Medicare Other

## 2022-02-07 ENCOUNTER — Telehealth: Payer: Self-pay | Admitting: Family Medicine

## 2022-02-07 ENCOUNTER — Other Ambulatory Visit: Payer: Self-pay | Admitting: Family Medicine

## 2022-02-07 ENCOUNTER — Ambulatory Visit: Payer: Medicare Other | Admitting: Family Medicine

## 2022-02-07 DIAGNOSIS — R739 Hyperglycemia, unspecified: Secondary | ICD-10-CM | POA: Diagnosis not present

## 2022-02-07 DIAGNOSIS — E1165 Type 2 diabetes mellitus with hyperglycemia: Secondary | ICD-10-CM

## 2022-02-07 LAB — MICROALBUMIN / CREATININE URINE RATIO
Creatinine,U: 20.2 mg/dL
Microalb Creat Ratio: 3.5 mg/g (ref 0.0–30.0)
Microalb, Ur: <0.7 mg/dL (ref 0.0–1.9)

## 2022-02-07 NOTE — Telephone Encounter (Signed)
Pt had labs drawn today

## 2022-02-07 NOTE — Telephone Encounter (Signed)
Patient needing to check a1c  Can we insert that for her

## 2022-02-08 LAB — LIPID PANEL
Cholesterol: 144 mg/dL (ref 0–200)
HDL: 55.3 mg/dL (ref >39.00)
LDL Cholesterol: 56 mg/dL (ref 0–99)
NonHDL: 89.12
Total CHOL/HDL Ratio: 3
Triglycerides: 166 mg/dL — ABNORMAL HIGH (ref 0.0–149.0)
VLDL: 33.2 mg/dL (ref 0.0–40.0)

## 2022-02-08 LAB — COMPREHENSIVE METABOLIC PANEL
ALT: 17 U/L (ref 0–35)
AST: 14 U/L (ref 0–37)
Albumin: 3.8 g/dL (ref 3.5–5.2)
Alkaline Phosphatase: 89 U/L (ref 39–117)
BUN: 21 mg/dL (ref 6–23)
CO2: 30 mEq/L (ref 19–32)
Calcium: 9.3 mg/dL (ref 8.4–10.5)
Chloride: 103 mEq/L (ref 96–112)
Creatinine, Ser: 1 mg/dL (ref 0.40–1.20)
GFR: 49.08 mL/min — ABNORMAL LOW (ref >60.00)
Glucose, Bld: 189 mg/dL — ABNORMAL HIGH (ref 70–99)
Potassium: 4.2 mEq/L (ref 3.5–5.1)
Sodium: 138 mEq/L (ref 135–145)
Total Bilirubin: 0.4 mg/dL (ref 0.2–1.2)
Total Protein: 6.5 g/dL (ref 6.0–8.3)

## 2022-02-08 LAB — HEMOGLOBIN A1C: Hgb A1c MFr Bld: 7.7 % — ABNORMAL HIGH (ref 4.6–6.5)

## 2022-02-11 ENCOUNTER — Other Ambulatory Visit: Payer: Self-pay

## 2022-02-11 ENCOUNTER — Ambulatory Visit (HOSPITAL_COMMUNITY)
Admission: RE | Admit: 2022-02-11 | Discharge: 2022-02-11 | Disposition: A | Discharge status: Home / Self Care | Payer: Medicare Other | Source: Home / Self Care | Attending: Gastroenterology | Admitting: Gastroenterology

## 2022-02-11 ENCOUNTER — Encounter (HOSPITAL_COMMUNITY)
Admission: RE | Disposition: A | Discharge status: Home / Self Care | Payer: Self-pay | Source: Home / Self Care | Attending: Gastroenterology

## 2022-02-11 ENCOUNTER — Ambulatory Visit (HOSPITAL_COMMUNITY): Payer: Medicare Other | Admitting: Anesthesiology

## 2022-02-11 ENCOUNTER — Ambulatory Visit (HOSPITAL_BASED_OUTPATIENT_CLINIC_OR_DEPARTMENT_OTHER): Payer: Medicare Other | Admitting: Anesthesiology

## 2022-02-11 ENCOUNTER — Encounter (HOSPITAL_COMMUNITY): Payer: Self-pay | Admitting: Gastroenterology

## 2022-02-11 ENCOUNTER — Ambulatory Visit (HOSPITAL_COMMUNITY)
Admission: RE | Admit: 2022-02-11 | Discharge: 2022-02-11 | Disposition: A | Discharge status: Home / Self Care | Payer: Medicare Other | Attending: Gastroenterology | Admitting: Gastroenterology

## 2022-02-11 DIAGNOSIS — Z419 Encounter for procedure for purposes other than remedying health state, unspecified: Secondary | ICD-10-CM

## 2022-02-11 DIAGNOSIS — Z4659 Encounter for fitting and adjustment of other gastrointestinal appliance and device: Secondary | ICD-10-CM | POA: Insufficient documentation

## 2022-02-11 DIAGNOSIS — I10 Essential (primary) hypertension: Secondary | ICD-10-CM | POA: Diagnosis not present

## 2022-02-11 DIAGNOSIS — K838 Other specified diseases of biliary tract: Secondary | ICD-10-CM

## 2022-02-11 DIAGNOSIS — Z4689 Encounter for fitting and adjustment of other specified devices: Secondary | ICD-10-CM

## 2022-02-11 DIAGNOSIS — R932 Abnormal findings on diagnostic imaging of liver and biliary tract: Secondary | ICD-10-CM | POA: Diagnosis not present

## 2022-02-11 DIAGNOSIS — G4730 Sleep apnea, unspecified: Secondary | ICD-10-CM | POA: Diagnosis not present

## 2022-02-11 DIAGNOSIS — E119 Type 2 diabetes mellitus without complications: Secondary | ICD-10-CM | POA: Diagnosis not present

## 2022-02-11 DIAGNOSIS — K219 Gastro-esophageal reflux disease without esophagitis: Secondary | ICD-10-CM | POA: Insufficient documentation

## 2022-02-11 DIAGNOSIS — K8689 Other specified diseases of pancreas: Secondary | ICD-10-CM

## 2022-02-11 DIAGNOSIS — Z87891 Personal history of nicotine dependence: Secondary | ICD-10-CM | POA: Insufficient documentation

## 2022-02-11 DIAGNOSIS — K8050 Calculus of bile duct without cholangitis or cholecystitis without obstruction: Secondary | ICD-10-CM | POA: Insufficient documentation

## 2022-02-11 HISTORY — PX: ENDOSCOPIC RETROGRADE CHOLANGIOPANCREATOGRAPHY (ERCP) WITH PROPOFOL: SHX5810

## 2022-02-11 HISTORY — PX: LITHOTRIPSY: SHX5546

## 2022-02-11 HISTORY — PX: SPYGLASS CHOLANGIOSCOPY: SHX5441

## 2022-02-11 HISTORY — PX: STENT REMOVAL: SHX6421

## 2022-02-11 HISTORY — PX: BILIARY DILATION: SHX6850

## 2022-02-11 HISTORY — PX: SPYGLASS LITHOTRIPSY: SHX5537

## 2022-02-11 HISTORY — PX: REMOVAL OF STONES: SHX5545

## 2022-02-11 LAB — GLUCOSE, CAPILLARY: Glucose-Capillary: 154 mg/dL — ABNORMAL HIGH (ref 70–99)

## 2022-02-11 SURGERY — ENDOSCOPIC RETROGRADE CHOLANGIOPANCREATOGRAPHY (ERCP) WITH PROPOFOL
Anesthesia: General

## 2022-02-11 MED ORDER — GLUCAGON HCL RDNA (DIAGNOSTIC) 1 MG IJ SOLR
INTRAMUSCULAR | Status: AC
  Filled 2022-02-11: qty 2

## 2022-02-11 MED ORDER — INDOMETHACIN 50 MG RE SUPP
RECTAL | Status: AC
  Filled 2022-02-11: qty 2

## 2022-02-11 MED ORDER — RIVAROXABAN 20 MG PO TABS: 20.0000 mg | ORAL_TABLET | Freq: Every day | ORAL | 3 refills | Status: DC

## 2022-02-11 MED ORDER — LACTATED RINGERS IV SOLN: INTRAVENOUS | Status: DC

## 2022-02-11 MED ORDER — LIDOCAINE HCL (CARDIAC) PF 100 MG/5ML IV SOSY
PREFILLED_SYRINGE | INTRAVENOUS | Status: DC | PRN
  Administered 2022-02-11: 50 mg via INTRAVENOUS

## 2022-02-11 MED ORDER — INDOMETHACIN 50 MG RE SUPP
RECTAL | Status: DC | PRN
Start: 2022-02-11 — End: 2022-02-11
  Administered 2022-02-11: 100 mg via RECTAL

## 2022-02-11 MED ORDER — CIPROFLOXACIN IN D5W 400 MG/200ML IV SOLN
INTRAVENOUS | Status: DC | PRN
  Administered 2022-02-11: 400 mg via INTRAVENOUS

## 2022-02-11 MED ORDER — CIPROFLOXACIN HCL 500 MG PO TABS: 500.0000 mg | ORAL_TABLET | Freq: Two times a day (BID) | ORAL | 0 refills | Status: AC

## 2022-02-11 MED ORDER — FENTANYL CITRATE (PF) 100 MCG/2ML IJ SOLN
INTRAMUSCULAR | Status: AC
  Filled 2022-02-11: qty 2

## 2022-02-11 MED ORDER — PROPOFOL 10 MG/ML IV BOLUS
INTRAVENOUS | Status: AC
  Filled 2022-02-11: qty 20

## 2022-02-11 MED ORDER — SUGAMMADEX SODIUM 200 MG/2ML IV SOLN
INTRAVENOUS | Status: DC | PRN
  Administered 2022-02-11: 200 mg via INTRAVENOUS

## 2022-02-11 MED ORDER — GLUCAGON HCL RDNA (DIAGNOSTIC) 1 MG IJ SOLR
INTRAMUSCULAR | Status: DC | PRN
  Administered 2022-02-11: .15 mg via INTRAVENOUS
  Administered 2022-02-11 (×3): .25 mg via INTRAVENOUS

## 2022-02-11 MED ORDER — SODIUM CHLORIDE 0.9 % IV SOLN
INTRAVENOUS | Status: DC | PRN
  Administered 2022-02-11: 80 mL

## 2022-02-11 MED ORDER — PROPOFOL 10 MG/ML IV BOLUS
INTRAVENOUS | Status: DC | PRN
  Administered 2022-02-11: 100 mg via INTRAVENOUS

## 2022-02-11 MED ORDER — CIPROFLOXACIN IN D5W 400 MG/200ML IV SOLN
INTRAVENOUS | Status: AC
  Filled 2022-02-11: qty 200

## 2022-02-11 MED ORDER — ROCURONIUM BROMIDE 100 MG/10ML IV SOLN
INTRAVENOUS | Status: DC | PRN
  Administered 2022-02-11: 10 mg via INTRAVENOUS
  Administered 2022-02-11: 15 mg via INTRAVENOUS
  Administered 2022-02-11: 50 mg via INTRAVENOUS
  Administered 2022-02-11: 10 mg via INTRAVENOUS

## 2022-02-11 MED ORDER — FENTANYL CITRATE (PF) 100 MCG/2ML IJ SOLN
INTRAMUSCULAR | Status: DC | PRN
  Administered 2022-02-11: 50 ug via INTRAVENOUS
  Administered 2022-02-11 (×2): 25 ug via INTRAVENOUS

## 2022-02-11 MED ORDER — PHENYLEPHRINE HCL (PRESSORS) 10 MG/ML IV SOLN
INTRAVENOUS | Status: DC | PRN
  Administered 2022-02-11: 60 ug via INTRAVENOUS
  Administered 2022-02-11: 40 ug via INTRAVENOUS
  Administered 2022-02-11: 60 ug via INTRAVENOUS

## 2022-02-11 MED ORDER — SODIUM CHLORIDE 0.9 % IV SOLN: INTRAVENOUS | Status: DC

## 2022-02-11 MED ORDER — ONDANSETRON HCL 4 MG/2ML IJ SOLN
INTRAMUSCULAR | Status: DC | PRN
  Administered 2022-02-11: 4 mg via INTRAVENOUS

## 2022-02-11 NOTE — Anesthesia Postprocedure Evaluation (Signed)
Anesthesia Post Note  Patient: Engineer, drilling  Procedure(s) Performed: ENDOSCOPIC RETROGRADE CHOLANGIOPANCREATOGRAPHY (ERCP) WITH PROPOFOL SPYGLASS CHOLANGIOSCOPY STENT REMOVAL REMOVAL OF STONES BILIARY DILATION SPYGLASS LITHOTRIPSY LITHOTRIPSY     Patient location during evaluation: Phase II Anesthesia Type: General Level of consciousness: awake and sedated Pain management: pain level controlled Vital Signs Assessment: post-procedure vital signs reviewed and stable Cardiovascular status: stable Postop Assessment: no apparent nausea or vomiting Anesthetic complications: no   No notable events documented.  Last Vitals:  Vitals:   02/11/22 1020 02/11/22 1030  BP: (!) 153/41 139/87  Pulse: 64 60  Resp: 17 18  Temp:    SpO2: 94% 96%    Last Pain:  Vitals:   02/11/22 1020  TempSrc:   PainSc: 0-No pain                 Huston Foley

## 2022-02-11 NOTE — Anesthesia Preprocedure Evaluation (Signed)
Anesthesia Evaluation  Patient identified by MRN, date of birth, ID band Patient awake    Reviewed: Allergy & Precautions, NPO status , Patient's Chart, lab work & pertinent test results  Airway Mallampati: I       Dental  (+) Upper Dentures, Lower Dentures   Pulmonary sleep apnea , former smoker,    Pulmonary exam normal        Cardiovascular hypertension, Pt. on medications Normal cardiovascular exam Rhythm:Regular Rate:Normal     Neuro/Psych  Neuromuscular disease negative psych ROS   GI/Hepatic GERD  Medicated,  Endo/Other  diabetes, Type 2, Oral Hypoglycemic Agents  Renal/GU   negative genitourinary   Musculoskeletal   Abdominal Normal abdominal exam  (+)   Peds  Hematology   Anesthesia Other Findings   Reproductive/Obstetrics                             Anesthesia Physical Anesthesia Plan  ASA: 2  Anesthesia Plan: General   Post-op Pain Management: Minimal or no pain anticipated   Induction: Intravenous  PONV Risk Score and Plan: 3 and Treatment may vary due to age or medical condition  Airway Management Planned: Oral ETT  Additional Equipment: None  Intra-op Plan:   Post-operative Plan: Extubation in OR  Informed Consent: I have reviewed the patients History and Physical, chart, labs and discussed the procedure including the risks, benefits and alternatives for the proposed anesthesia with the patient or authorized representative who has indicated his/her understanding and acceptance.     Dental advisory given  Plan Discussed with: CRNA  Anesthesia Plan Comments:         Anesthesia Quick Evaluation

## 2022-02-11 NOTE — Op Note (Addendum)
[facility] Patient Name: Sherry Holloway Procedure Date: 02/11/2022 MRN: [id number] Attending MD: Justice Britain , MD Date of Birth: 1928/04/16 CSN: [id number] Age: 88 Admit Type: Inpatient Procedure:                ERCP Indications:              Bile duct stone(s), Biliary stent removal Providers:                Justice Britain, MD, Tyna Jaksch Technician,                            Jeanella Cara, RN Referring MD:             [name], MD, Rosalita Chessman, Izola Price                            Sherrill Medicines:                General Anesthesia, Cipro 400 mg IV, Indomethacin                            100 mg PR, Glucagon 1 mg IV Complications:            No immediate complications. Estimated Blood Loss:     Estimated blood loss was minimal. Procedure:                Pre-Anesthesia Assessment:                           - Prior to the procedure, a History and Physical                            was performed, and patient medications and                            allergies were reviewed. The patient's tolerance of                            previous anesthesia was also reviewed. The risks                            and benefits of the procedure and the sedation                            options and risks were discussed with the patient.                            All questions were answered, and informed consent                            was obtained. Prior Anticoagulants: The patient has                            taken Xarelto (rivaroxaban), last dose was 2 days  prior to procedure. ASA Grade Assessment: III - A                            patient with severe systemic disease. After                            reviewing the risks and benefits, the patient was                            deemed in satisfactory condition to undergo the                            procedure.                           After obtaining  informed consent, the scope was                            passed under direct vision. Throughout the                            procedure, the patient's blood pressure, pulse, and                            oxygen saturations were monitored continuously. The                            [id number]-Q190V ([id number] Olympus duodenoscope was                            introduced through the mouth, and used to inject                            contrast into and used for direct visualization of                            the bile duct. The ERCP was somewhat difficult due                            to challenging cannulation because of abnormal                            anatomy. Successful completion of the procedure was                            aided by performing the maneuvers documented                            (below) in this report. The patient tolerated the                            procedure. Scope In: Scope Out: Findings:      A scout film of the abdomen was obtained. Surgical clips, consistent  with previous cholecystectomy, were seen in the area of the right upper       quadrant of the abdomen. One stent ending in the main bile duct was seen.      The esophagus was successfully intubated under direct vision without       detailed examination of the pharynx, larynx, and associated structures,       and upper GI tract. The major papilla was located entirely within a       diverticulum. A biliary sphincterotomy had been performed. The       sphincterotomy appeared open. Mucin was emerging from the major papilla       consistent with known IPMN. One plastic biliary stent originating in the       biliary tree was emerging from the major papilla. The stent was removed       from the biliary tree using a snare.      A short 0.035 inch Soft Jagwire was passed into the biliary tree. The       Hydratome sphincterotome was passed over the guidewire and the bile duct       was then deeply  cannulated. Contrast was injected. I personally       interpreted the bile duct images. Ductal flow of contrast was adequate.       Image quality was adequate. Contrast extended to the hepatic ducts.       Opacification of the entire biliary tree except for the gallbladder was       successful. The entire biliary tree was markedly dilated. The largest       diameter was nearly 20 mm. The lower third of the main duct contained       filling defects thought to be stones and sludge. The biliary tree was       swept with a retrieval balloon. Sludge was swept from the duct. A few       stone fragments were removed. One large stone remained and nearly       impacted. Dilation of the common bile duct with an 07-24-09 mm balloon (to       a maximum balloon size of 10 mm) dilator was successful as a DASE (not       clear how much larger I can make this as it is entirely within the       diverticulum. The biliary tree was swept with a retrieval balloon.       Sludge was swept from the duct. One small sized stone fragment was       removed. One stone fragment remained. I then introduced the 2.0 cm       basket in effort of trying to perform mechanical lithotripsy and this       was partially successful. The biliary tree was swept with a retrieval       balloon. One stone fragment was removed. After cholangiogram was       performed, I had concern for a stone fragment being in the right hepatic       duct and it would not come down with balloon trawl.      I then proceeded with Cholangioscopy and the bile duct was explored       endoscopically using the SpyGlass direct visualization system. The       SpyScope was advanced to the bifurcation. Visibility with the scope was       good. The right main hepatic duct contained  one stone, which was 18 mm       in diameter. The entire biliary tree was severely dilated.       Electrohydraulic lithotripsy was partially successful. However, the       scope  positioning did not allow for complete breakage of the stone due       to it being in the right hepatic duct. I then placed a 0.025 inch       Jagwire (450 cm) into the duct for selective cannulation.      I then transitioned back and to discover objects, the biliary tree was       swept with the 15-18 mm extraction balloon. Sludge was swept from the       duct. I could not get complete removal of the stone in the distal duct,       so I once again used the 2.0 cm basket in attempt of performing a       lithotripsy. The biliary tree was swept with the basket starting at the       bifurcation and the stone fragment and sdludge was removed. No stones       remained. An occlusion cholangiogram was performed that showed no       further significant biliary pathology.      A pancreatogram was not performed.      The duodenoscope was withdrawn from the patient. Impression:               - The major papilla was located entirely within a                            diverticulum.                           - Prior biliary sphincterotomy appeared open.                           - Mucin was seen in the major papilla in setting of                            known IPMN.                           - One stent from the biliary tree was seen in the                            major papilla - this was removed.                           - The entire biliary tree was markedly dilated.                           - Filling defects consistent with stones and sludge                            were seen on the cholangiogram.                           - Balloon sphincteroplasty to perform DASE and then  use of Mechanical Lithotripsy and EHL Lithotripsy                            and Balloon trawl led to removal of                            choledocholithiasis as noted above. Moderate Sedation:      Not Applicable - Patient had care per Anesthesia. Recommendation:           - The patient  will be observed post-procedure,                            until all discharge criteria are met.                           - Discharge patient to home.                           - Low fat diet.                           - Check liver enzymes (AST, ALT, alkaline                            phosphatase, bilirubin) in 2 weeks.                           - Observe patient's clinical course.                           - Ciprofloxacin 500 mg twice daily for 3-days to                            decrease risk of post-interventional infectious                            complications.                           - May restart Xarelto on 2/28.                           - Watch for pancreatitis, bleeding, perforation,                            and cholangitis.                           - The findings and recommendations were discussed                            with the patient.                           - Patient will be at risk of potential recurrent  choledocholithiasis and biliary sludge due to the                            size of her duct. I think the IPMN may be causing                            mucous production that could inhibit complete bile                            passage as well or potentially causing some                            compression distally as well. If patient has                            continued issues with biliary obstruction, will                            consider distal stenting (though chance of                            migration may be present. Could consider                            cholestyramine but without a gallbladder now may                            not be effective.                           - The findings and recommendations were discussed                            with the patient.                           - The findings and recommendations were discussed                            with the patient's  family. Procedure Code(s):        --- Professional ---                           [phone], Endoscopic retrograde                            cholangiopancreatography (ERCP); with destruction                            of calculi, any method (eg, mechanical,                            electrohydraulic, lithotripsy)                           43275, Endoscopic  retrograde                            cholangiopancreatography (ERCP); with removal of                            foreign body(s) or stent(s) from biliary/pancreatic                            duct(s)                           43273, Endoscopic cannulation of papilla with                            direct visualization of pancreatic/common bile                            duct(s) (List separately in addition to code(s) for                            primary procedure) Diagnosis Code(s):        --- Professional ---                           K83.8, Other specified diseases of biliary tract                           Z96.89, Presence of other specified functional                            implants                           K80.50, Calculus of bile duct without cholangitis                            or cholecystitis without obstruction                           Z46.59, Encounter for fitting and adjustment of                            other gastrointestinal appliance and device                           R93.2, Abnormal findings on diagnostic imaging of                            liver and biliary tract CPT copyright 2019 American Medical Association. All rights reserved. The codes documented in this report are preliminary and upon coder review may  be revised to meet current compliance requirements. Justice Britain, MD 02/11/2022 10:12:33 AM Number of Addenda: 0

## 2022-02-11 NOTE — Transfer of Care (Signed)
Immediate Anesthesia Transfer of Care Note  Patient: Sherry Holloway  Procedure(s) Performed: ENDOSCOPIC RETROGRADE CHOLANGIOPANCREATOGRAPHY (ERCP) WITH PROPOFOL SPYGLASS CHOLANGIOSCOPY STENT REMOVAL REMOVAL OF STONES BILIARY DILATION SPYGLASS LITHOTRIPSY LITHOTRIPSY  Patient Location: PACU and Endoscopy Unit  Anesthesia Type:General  Level of Consciousness: awake, alert , oriented and patient cooperative  Airway & Oxygen Therapy: Patient Spontanous Breathing and Patient connected to face mask oxygen  Post-op Assessment: Report given to RN and Post -op Vital signs reviewed and stable  Post vital signs: Reviewed and stable  Last Vitals:  Vitals Value Taken Time  BP 157/45 02/11/22 1005  Temp 36.9 C 02/11/22 1005  Pulse 59 02/11/22 1006  Resp 9 02/11/22 1006  SpO2 99 % 02/11/22 1006  Vitals shown include unvalidated device data.  Last Pain:  Vitals:   02/11/22 1005  TempSrc: Oral  PainSc: 0-No pain         Complications: No notable events documented.

## 2022-02-11 NOTE — H&P (Signed)
GASTROENTEROLOGY PROCEDURE H&P NOTE   Primary Care Physician: Carollee Herter, [name], DO  HPI: Sherry Holloway is a 88 y.o. female who presents for ERCP for choledocholithiasis.  Past Medical History:  Diagnosis Date   Arthritis    Choledocholithiasis    Diabetes mellitus without complication ([place])    Diverticulitis    Esophageal cancer ([place]) dx'd 2020   GERD (gastroesophageal reflux disease)    Hyperlipidemia    Hypertension    OSA (obstructive sleep apnea)    Urine incontinence    Uterine cancer [facility])    Past Surgical History:  Procedure Laterality Date   ABDOMINAL HYSTERECTOMY  1972   APPENDECTOMY     BILIARY DILATION  03/05/2021   Procedure: BILIARY DILATION;  Surgeon: Irving Copas., MD;  Location: [facility];  Service: Gastroenterology;;   BILIARY DILATION  11/27/2021   Procedure: BILIARY DILATION;  Surgeon: [name], MD;  Location: [place];  Service: Endoscopy;;   BILIARY STENT PLACEMENT  11/27/2021   Procedure: BILIARY STENT PLACEMENT;  Surgeon: [name], MD;  Location: [place];  Service: Endoscopy;;   BIOPSY  03/05/2021   Procedure: BIOPSY;  Surgeon: Irving Copas., MD;  Location: [facility];  Service: Gastroenterology;;   BIOPSY  11/27/2021   Procedure: BIOPSY;  Surgeon: [name], MD;  Location: [facility] ENDOSCOPY;  Service: Endoscopy;;   CHOLECYSTECTOMY     ENDOSCOPIC RETROGRADE CHOLANGIOPANCREATOGRAPHY (ERCP) WITH PROPOFOL N/A 03/05/2021   Procedure: ENDOSCOPIC RETROGRADE CHOLANGIOPANCREATOGRAPHY (ERCP) WITH PROPOFOL;  Surgeon: Irving Copas., MD;  Location: [facility];  Service: Gastroenterology;  Laterality: N/A;   ENDOSCOPIC RETROGRADE CHOLANGIOPANCREATOGRAPHY (ERCP) WITH PROPOFOL N/A 11/27/2021   Procedure: ENDOSCOPIC RETROGRADE CHOLANGIOPANCREATOGRAPHY (ERCP) WITH PROPOFOL;  Surgeon: [name], MD;  Location: [facility] ENDOSCOPY;  Service: Endoscopy;  Laterality: N/A;  possible EGD prior    ESOPHAGOGASTRODUODENOSCOPY N/A 11/27/2021   Procedure: ESOPHAGOGASTRODUODENOSCOPY (EGD);  Surgeon: [name], MD;  Location: [facility] ENDOSCOPY;  Service: Endoscopy;  Laterality: N/A;   ESOPHAGOGASTRODUODENOSCOPY (EGD) WITH PROPOFOL N/A 03/05/2021   Procedure: ESOPHAGOGASTRODUODENOSCOPY (EGD) WITH PROPOFOL;  Surgeon: Rush Landmark Telford Nab., MD;  Location: [facility] ENDOSCOPY;  Service: Gastroenterology;  Laterality: N/A;   EUS N/A 03/05/2021   Procedure: UPPER ENDOSCOPIC ULTRASOUND (EUS) RADIAL;  Surgeon: Irving Copas., MD;  Location: [facility] ENDOSCOPY;  Service: Gastroenterology;  Laterality: N/A;   LUMBAR LAMINECTOMY     REMOVAL OF STONES  03/05/2021   Procedure: REMOVAL OF STONES;  Surgeon: Rush Landmark Telford Nab., MD;  Location: [facility];  Service: Gastroenterology;;   REMOVAL OF STONES  11/27/2021   Procedure: REMOVAL OF STONES;  Surgeon: [name], MD;  Location: [facility] ENDOSCOPY;  Service: Endoscopy;;   SPINAL CORD STIMULATOR IMPLANT     SPYGLASS CHOLANGIOSCOPY N/A 03/05/2021   Procedure: BMWUXLKG CHOLANGIOSCOPY;  Surgeon: Irving Copas., MD;  Location: [facility];  Service: Gastroenterology;  Laterality: N/A;   SPYGLASS LITHOTRIPSY N/A 03/05/2021   Procedure: MWNUUVOZ LITHOTRIPSY;  Surgeon: Irving Copas., MD;  Location: [facility];  Service: Gastroenterology;  Laterality: N/A;   TONSILLECTOMY     TOTAL KNEE ARTHROPLASTY Right    Current Facility-Administered Medications  Medication Dose Route Frequency Provider Last Rate Last Admin   0.9 %  sodium chloride infusion   Intravenous Continuous Mansouraty, Telford Nab., MD       lactated ringers infusion   Intravenous Continuous Mansouraty, Telford Nab., MD 10 mL/hr at 02/11/22 0705 New Bag at 02/11/22 0705    Current Facility-Administered Medications:  0.9 %  sodium chloride infusion, , Intravenous, Continuous, Mansouraty, Telford Nab., MD   lactated ringers infusion, , Intravenous, Continuous, Mansouraty,  Telford Nab., MD, Last Rate: 10 mL/hr at 02/11/22 0705, New Bag at 02/11/22 0705 Allergies  Allergen Reactions   Gramicidin Other (See Comments)    Unknown reaction type   Sulfa Antibiotics Rash   Wound Dressing Adhesive Rash   Family History  Problem Relation Age of Onset   Diabetes Mother    Heart disease Father    Lung cancer Sister    Lung cancer Son    Hypertension Son    Hypertension Daughter    Diabetes Son    Hypertension Son    Diabetes Daughter    Hypertension Daughter    Social History   Socioeconomic History   Marital status: Widowed    Spouse name: Not on file   Number of children: 4   Years of education: Not on file   Highest education level: Not on file  Occupational History   Occupation: Retired Quarry manager  Tobacco Use   Smoking status: Former    Packs/day: 1.00    Years: 35.00    Pack years: 35.00    Types: Cigarettes    Quit date: 01/17/1989    Years since quitting: 33.0   Smokeless tobacco: Never  Vaping Use   Vaping Use: Never used  Substance and Sexual Activity   Alcohol use: Not Currently   Drug use: Never   Sexual activity: Not Currently  Other Topics Concern   Not on file  Social History Narrative   Just moved from [place]--- and moved into an apt 2.4 miles from her daughter    Social Determinants of Health   Financial Resource Strain: Not on file  Food Insecurity: Not on file  Transportation Needs: Not on file  Physical Activity: Not on file  Stress: Not on file  Social Connections: Not on file  Intimate Partner Violence: Not At Risk   Fear of Current or Ex-Partner: No   Emotionally Abused: No   Physically Abused: No   Sexually Abused: No    Physical Exam: Today's Vitals   02/11/22 0702  BP: (!) 185/39  Pulse: 66  Resp: 19  Temp: 98.2 F (36.8 C)  TempSrc: Temporal  SpO2: 98%  Weight: 68.9 kg  Height: 5\' 4"  (1.626 m)  PainSc: 0-No pain   Body mass index is 26.09 kg/m. GEN: NAD EYE: Sclerae anicteric ENT: MMM CV:  Non-tachycardic GI: Soft, NT/ND NEURO:  Alert & Oriented x 3  Lab Results: No results for input(s): WBC, HGB, HCT, PLT in the last 72 hours. BMET No results for input(s): NA, K, CL, CO2, GLUCOSE, BUN, CREATININE, CALCIUM in the last 72 hours. LFT No results for input(s): PROT, ALBUMIN, AST, ALT, ALKPHOS, BILITOT, BILIDIR, IBILI in the last 72 hours. PT/INR No results for input(s): LABPROT, INR in the last 72 hours.   Impression / Plan: This is a 88 y.o.female who presents for ERCP for choledocholithiasis.  The risks of an ERCP were discussed at length, including but not limited to the risk of perforation, bleeding, abdominal pain, post-ERCP pancreatitis (while usually mild can be severe and even life threatening).   The risks and benefits of endoscopic evaluation/treatment were discussed with the patient and/or family; these include but are not limited to the risk of perforation, infection, bleeding, missed lesions, lack of diagnosis, severe illness requiring hospitalization, as well as anesthesia and sedation related illnesses.  The patient's history has  been reviewed, patient examined, no change in status, and deemed stable for procedure.  The patient and/or family is agreeable to proceed.    Justice Britain, MD [facility]oscopy Office # [phone]

## 2022-02-11 NOTE — Anesthesia Procedure Notes (Signed)

## 2022-02-12 ENCOUNTER — Encounter (HOSPITAL_COMMUNITY): Payer: Self-pay | Admitting: Gastroenterology

## 2022-02-17 ENCOUNTER — Other Ambulatory Visit (INDEPENDENT_AMBULATORY_CARE_PROVIDER_SITE_OTHER): Payer: Medicare Other | Admitting: Family Medicine

## 2022-02-17 DIAGNOSIS — E1165 Type 2 diabetes mellitus with hyperglycemia: Secondary | ICD-10-CM

## 2022-02-18 ENCOUNTER — Encounter: Payer: Self-pay | Admitting: Family Medicine

## 2022-02-18 DIAGNOSIS — E1165 Type 2 diabetes mellitus with hyperglycemia: Secondary | ICD-10-CM

## 2022-02-18 MED ORDER — EMPAGLIFLOZIN 10 MG PO TABS: 10.0000 mg | ORAL_TABLET | Freq: Every day | ORAL | 1 refills | Status: DC

## 2022-02-18 MED ORDER — EMPAGLIFLOZIN 25 MG PO TABS: 25.0000 mg | ORAL_TABLET | Freq: Every day | ORAL | 1 refills | Status: DC

## 2022-02-18 MED ORDER — FAMCICLOVIR 125 MG PO TABS: 125.0000 mg | ORAL_TABLET | Freq: Every day | ORAL | 3 refills | Status: DC

## 2022-02-18 NOTE — Addendum Note (Signed)
Addended byDamita Dunnings D on: 02/18/2022 02:14 PM   Modules accepted: Orders

## 2022-02-19 ENCOUNTER — Encounter: Payer: Self-pay | Admitting: Family Medicine

## 2022-02-19 ENCOUNTER — Ambulatory Visit (INDEPENDENT_AMBULATORY_CARE_PROVIDER_SITE_OTHER): Payer: Medicare Other | Admitting: Family Medicine

## 2022-02-19 VITALS — BP 128/58 | HR 66 | Temp 98.1°F | Resp 16 | Ht 64.0 in | Wt 154.0 lb

## 2022-02-19 DIAGNOSIS — R21 Rash and other nonspecific skin eruption: Secondary | ICD-10-CM | POA: Diagnosis not present

## 2022-02-19 MED ORDER — DOXYCYCLINE HYCLATE 100 MG PO TABS: 100.0000 mg | ORAL_TABLET | Freq: Two times a day (BID) | ORAL | 0 refills | Status: DC

## 2022-02-19 MED ORDER — IODOQUINOL-HYDROCORTISONE-ALOE 1-1.9 % EX CREA: TOPICAL_CREAM | CUTANEOUS | 1 refills | Status: DC

## 2022-02-19 NOTE — Assessment & Plan Note (Signed)
Doxycycline and vytone ?Warm compresses  ?rto if no relief  ?

## 2022-02-19 NOTE — Progress Notes (Signed)
Established Patient Office Visit  Subjective:  Patient ID: Sherry Holloway, female    DOB: 10-21-1928  Age: 88 y.o. MRN: [id number]  CC:  Chief Complaint  Patient presents with   Rash    Here for complains of rash of both sides of bottom area onset 4 weeks    HPI Clay Spivak presents for rash on labia that is tender.  Her daughter is with her.  They think it is from when she was in rehab and had to where diapers  Past Medical History:  Diagnosis Date   Arthritis    Choledocholithiasis    Diabetes mellitus without complication ([place])    Diverticulitis    Esophageal cancer ([place]) dx'd 2020   GERD (gastroesophageal reflux disease)    Hyperlipidemia    Hypertension    OSA (obstructive sleep apnea)    Urine incontinence    Uterine cancer [facility])     Past Surgical History:  Procedure Laterality Date   ABDOMINAL HYSTERECTOMY  1972   APPENDECTOMY     BILIARY DILATION  03/05/2021   Procedure: BILIARY DILATION;  Surgeon: Irving Copas., MD;  Location: [facility];  Service: Gastroenterology;;   BILIARY DILATION  11/27/2021   Procedure: BILIARY DILATION;  Surgeon: [name], MD;  Location: [place];  Service: Endoscopy;;   BILIARY DILATION  02/11/2022   Procedure: BILIARY DILATION;  Surgeon: Irving Copas., MD;  Location: [facility];  Service: Gastroenterology;;   BILIARY STENT PLACEMENT  11/27/2021   Procedure: BILIARY STENT PLACEMENT;  Surgeon: [name], MD;  Location: [place];  Service: Endoscopy;;   BIOPSY  03/05/2021   Procedure: BIOPSY;  Surgeon: Irving Copas., MD;  Location: [facility];  Service: Gastroenterology;;   BIOPSY  11/27/2021   Procedure: BIOPSY;  Surgeon: [name], MD;  [facility];  Service: Endoscopy;;   CHOLECYSTECTOMY     ENDOSCOPIC RETROGRADE CHOLANGIOPANCREATOGRAPHY (ERCP) WITH PROPOFOL N/A 03/05/2021   Procedure: ENDOSCOPIC RETROGRADE CHOLANGIOPANCREATOGRAPHY (ERCP) WITH PROPOFOL;   Surgeon: Irving Copas., MD;  Location: [facility] ENDOSCOPY;  Service: Gastroenterology;  Laterality: N/A;   ENDOSCOPIC RETROGRADE CHOLANGIOPANCREATOGRAPHY (ERCP) WITH PROPOFOL N/A 11/27/2021   Procedure: ENDOSCOPIC RETROGRADE CHOLANGIOPANCREATOGRAPHY (ERCP) WITH PROPOFOL;  Surgeon: [name], MD;  Location: [facility] ENDOSCOPY;  Service: Endoscopy;  Laterality: N/A;  possible EGD prior   ENDOSCOPIC RETROGRADE CHOLANGIOPANCREATOGRAPHY (ERCP) WITH PROPOFOL N/A 02/11/2022   Procedure: ENDOSCOPIC RETROGRADE CHOLANGIOPANCREATOGRAPHY (ERCP) WITH PROPOFOL;  Surgeon: Rush Landmark Telford Nab., MD;  Location: [facility];  Service: Gastroenterology;  Laterality: N/A;   ESOPHAGOGASTRODUODENOSCOPY N/A 11/27/2021   Procedure: ESOPHAGOGASTRODUODENOSCOPY (EGD);  Surgeon: [name], MD;  Location[facility] END[facility]COPY;  Service: Endoscopy;  Laterality: N/A;   ESOPHAGOGASTRODUODENOSCOPY (EGD) WITH PROPOFOL N/A 03/05/2021   Procedure: ESOPHAGOGASTRODUODENOSCOPY (EGD) WITH PROPOFOL;  Surgeon: Rush Landmark Telford Nab., MD;  Location: [facility];  Service: Gastroenterology;  Laterality: N/A;   EUS N/A 03/05/2021   Procedure: UPPER ENDOSCOPIC ULTRASOUND (EUS) RADIAL;  Surgeon: Irving Copas., MD;  Location: [facility];  Service: Gastroenterology;  Laterality: N/A;   LITHOTRIPSY  02/11/2022   Procedure: LITHOTRIPSY;  Surgeon: Mansouraty, Telford Nab., MD;  Location: [facility];  Service: Gastroenterology;;   LUMBAR LAMINECTOMY     REMOVAL OF STONES  03/05/2021   Procedure: REMOVAL OF STONES;  Surgeon: Irving Copas., MD;  Location: [facility];  Service: Gastroenterology;;   REMOVAL OF STONES  11/27/2021   Procedure: REMOVAL OF STONES;  Surgeon: [name], MD;  Location: [place];  Service: Endoscopy;;   REMOVAL OF STONES  02/11/2022   Procedure: REMOVAL OF STONES;  Surgeon: Rush Landmark Telford Nab., MD;  Location: [facility];  Service: Gastroenterology;;   SPINAL CORD STIMULATOR IMPLANT      SPYGLASS CHOLANGIOSCOPY N/A 03/05/2021   Procedure: [id number] CHOLANGIOSCOPY;  Surgeon: Irving Copas., MD;  Location: [facility];  Service: Gastroenterology;  Laterality: N/A;   SPYGLASS CHOLANGIOSCOPY N/A 02/11/2022   Procedure: SPYGLASS CHOLANGIOSCOPY;  Surgeon: Irving Copas., MD;  Location: [facility];  Service: Gastroenterology;  Laterality: N/A;   SPYGLASS LITHOTRIPSY N/A 03/05/2021   Procedure: [id number] LITHOTRIPSY;  Surgeon: Irving Copas., MD;  Location: [facility];  Service: Gastroenterology;  Laterality: N/A;   SPYGLASS LITHOTRIPSY N/A 02/11/2022   Procedure: JMEQASTM LITHOTRIPSY;  Surgeon: Irving Copas., MD;  Location: [facility];  Service: Gastroenterology;  Laterality: N/A;   STENT REMOVAL  02/11/2022   Procedure: STENT REMOVAL;  Surgeon: Rush Landmark Telford Nab., MD;  Location: [facility];  Service: Gastroenterology;;   TONSILLECTOMY     TOTAL KNEE ARTHROPLASTY Right     Family History  Problem Relation Age of Onset   Diabetes Mother    Heart disease Father    Lung cancer Sister    Lung cancer Son    Hypertension Son    Hypertension Daughter    Diabetes Son    Hypertension Son    Diabetes Daughter    Hypertension Daughter     Social History   Socioeconomic History   Marital status: Widowed    Spouse name: Not on file   Number of children: 4   Years of education: Not on file   Highest education level: Not on file  Occupational History   Occupation: Retired Quarry manager  Tobacco Use   Smoking status: Former    Packs/day: 1.00    Years: 35.00    Pack years: 35.00    Types: Cigarettes    Quit date: 01/17/1989    Years since quitting: 33.1   Smokeless tobacco: Never  Vaping Use   Vaping Use: Never used  Substance and Sexual Activity   Alcohol use: Not Currently   Drug use: Never   Sexual activity: Not Currently  Other Topics Concern   Not on file  Social History Narrative   Just moved from [place]--- and moved into an apt  2.4 miles from her daughter    Social Determinants of Health   Financial Resource Strain: Not on file  Food Insecurity: Not on file  Transportation Needs: Not on file  Physical Activity: Not on file  Stress: Not on file  Social Connections: Not on file  Intimate Partner Violence: Not At Risk   Fear of Current or Ex-Partner: No   Emotionally Abused: No   Physically Abused: No   Sexually Abused: No    Outpatient Medications Prior to Visit  Medication Sig Dispense Refill   empagliflozin (JARDIANCE) 25 MG TABS tablet Take 1 tablet (25 mg total) by mouth daily before breakfast. 90 tablet 1   acetaminophen (TYLENOL) 325 MG tablet Take 352-650 mg by mouth every 6 (six) hours as needed for mild pain or moderate pain.     bumetanide (BUMEX) 1 MG tablet Take 1 tablet (1 mg total) by mouth daily. Take 1/2 to 1 tablet daily for swelling (Patient taking differently: Take 1 mg by mouth daily.) 90 tablet 1   docusate sodium (COLACE) 100 MG capsule Take 1 capsule (100 mg total) by mouth 2 (two) times daily. 10 capsule 0  famciclovir (FAMVIR) 125 MG tablet Take 1 tablet (125 mg total) by mouth daily. 90 tablet 3   glimepiride (AMARYL) 4 MG tablet Take 4 mg by mouth 2 (two) times daily.     glucose blood test strip Check blood sugars twice daily 200 each 12   latanoprost (XALATAN) 0.005 % ophthalmic solution Place 1 drop into both eyes at bedtime. 2.5 mL 2   linaclotide (LINZESS) 145 MCG CAPS capsule Take 1 capsule (145 mcg total) by mouth daily before breakfast. 90 capsule 1   lipase/protease/amylase (CREON) 36000 UNITS CPEP capsule TAKE 1 CAPSULE THREE TIMES A DAY BEFORE MEALS (Patient taking differently: 36,000 Units 2 (two) times daily. With breakfast & with supper) 270 capsule 1   mirtazapine (REMERON) 15 MG tablet TAKE 1 TABLET AT BEDTIME 90 tablet 0   morphine (MS CONTIN) 30 MG 12 hr tablet Take 30 mg by mouth every 12 (twelve) hours.     Multiple Vitamin (MULTIVITAMIN WITH MINERALS) TABS tablet  Take 1 tablet by mouth daily with supper.     omeprazole (PRILOSEC) 20 MG capsule TAKE 1 CAPSULE TWICE A DAY BEFORE MEALS 180 capsule 1   rivaroxaban (XARELTO) 20 MG TABS tablet Take 1 tablet (20 mg total) by mouth daily with supper. 90 tablet 3   senna (SENOKOT) 8.6 MG TABS tablet Take 1 tablet (8.6 mg total) by mouth daily. (Patient taking differently: Take 2 tablets by mouth in the morning.) 120 tablet 0   valsartan (DIOVAN) 40 MG tablet Take 1 tablet (40 mg total) by mouth daily with breakfast. 90 tablet 1   No facility-administered medications prior to visit.    Allergies  Allergen Reactions   Gramicidin Other (See Comments)    Unknown reaction type   Sulfa Antibiotics Rash   Wound Dressing Adhesive Rash    ROS Review of Systems  Constitutional:  Negative for appetite change, diaphoresis, fatigue and unexpected weight change.  Eyes:  Negative for pain, redness and visual disturbance.  Respiratory:  Negative for cough, chest tightness, shortness of breath and wheezing.   Cardiovascular:  Negative for chest pain, palpitations and leg swelling.  Endocrine: Negative for cold intolerance, heat intolerance, polydipsia, polyphagia and polyuria.  Genitourinary:  Negative for difficulty urinating, dysuria and frequency.  Skin:  Positive for rash.  Neurological:  Negative for dizziness, light-headedness, numbness and headaches.     Objective:    Physical Exam Vitals and nursing note reviewed.  Skin:    Findings: Erythema and rash present.     Comments: Papules on labia with white head  Tender to touch     BP (!) 128/58 (BP Location: Right Arm, Patient Position: Sitting, Cuff Size: Normal)    Pulse 66    Temp 98.1 F (36.7 C) (Oral)    Resp 16    Ht '5\' 4"'$  (1.626 m)    Wt 154 lb (69.9 kg)    SpO2 95%    BMI 26.43 kg/m  Wt Readings from Last 3 Encounters:  02/19/22 154 lb (69.9 kg)  02/11/22 152 lb (68.9 kg)  01/01/22 156 lb 9.6 oz (71 kg)     Health Maintenance Due  Topic  Date Due   FOOT EXAM  Never done   OPHTHALMOLOGY EXAM  Never done   TETANUS/TDAP  Never done   Zoster Vaccines- Shingrix (1 of 2) Never done   Pneumonia Vaccine 41+ Years old (1 - PCV) 02/04/1995    There are no preventive care reminders to display for this patient.  No results found for: TSH Lab Results  Component Value Date   WBC 7.0 01/01/2022   HGB 11.5 (L) 01/01/2022   HCT 36.3 01/01/2022   MCV 87.7 01/01/2022   PLT 209 01/01/2022   Lab Results  Component Value Date   NA 138 02/07/2022   K 4.2 02/07/2022   CO2 30 02/07/2022   GLUCOSE 189 (H) 02/07/2022   BUN 21 02/07/2022   CREATININE 1.00 02/07/2022   BILITOT 0.4 02/07/2022   ALKPHOS 89 02/07/2022   AST 14 02/07/2022   ALT 17 02/07/2022   PROT 6.5 02/07/2022   ALBUMIN 3.8 02/07/2022   CALCIUM 9.3 02/07/2022   ANIONGAP 10 01/01/2022   GFR 49.08 (L) 02/07/2022   Lab Results  Component Value Date   CHOL 144 02/07/2022   Lab Results  Component Value Date   HDL 55.30 02/07/2022   Lab Results  Component Value Date   LDLCALC 56 02/07/2022   Lab Results  Component Value Date   TRIG 166.0 (H) 02/07/2022   Lab Results  Component Value Date   CHOLHDL 3 02/07/2022   Lab Results  Component Value Date   HGBA1C 7.7 (H) 02/07/2022      Assessment & Plan:   Problem List Items Addressed This Visit       Unprioritized   Rash and nonspecific skin eruption - Primary    Doxycycline and vytone Warm compresses  rto if no relief       Relevant Medications   doxycycline (VIBRA-TABS) 100 MG tablet   Iodoquinol-Hydrocortisone-Aloe 1-1.9 % CREA    Meds ordered this encounter  Medications   doxycycline (VIBRA-TABS) 100 MG tablet    Sig: Take 1 tablet (100 mg total) by mouth 2 (two) times daily.    Dispense:  20 tablet    Refill:  0   Iodoquinol-Hydrocortisone-Aloe 1-1.9 % CREA    Sig: Apply thin layer 3-4 x daily    Dispense:  28.4 g    Refill:  1    Follow-up: Return if symptoms worsen or fail to  improve.    Ann Held, DO

## 2022-03-04 ENCOUNTER — Encounter: Payer: Self-pay | Admitting: Family Medicine

## 2022-03-20 ENCOUNTER — Telehealth: Payer: Self-pay | Admitting: Family Medicine

## 2022-03-20 NOTE — Telephone Encounter (Signed)
Left message for patient to call back and schedule Medicare Annual Wellness Visit (AWV) either virtually or phone ? ? ? AWVS DUE 10/16/2017 PER [facility]; please schedule at anytime with health coach ? ?I left my direct # [phone] ? ? ?

## 2022-04-01 ENCOUNTER — Ambulatory Visit: Payer: Medicare Other | Admitting: Family Medicine

## 2022-04-02 ENCOUNTER — Ambulatory Visit (INDEPENDENT_AMBULATORY_CARE_PROVIDER_SITE_OTHER): Payer: Medicare Other | Admitting: Family Medicine

## 2022-04-02 ENCOUNTER — Ambulatory Visit (INDEPENDENT_AMBULATORY_CARE_PROVIDER_SITE_OTHER): Payer: Medicare Other

## 2022-04-02 ENCOUNTER — Encounter: Payer: Self-pay | Admitting: Family Medicine

## 2022-04-02 VITALS — BP 128/66 | HR 72 | Temp 99.0°F | Resp 16 | Ht 64.0 in | Wt 154.0 lb

## 2022-04-02 DIAGNOSIS — E1169 Type 2 diabetes mellitus with other specified complication: Secondary | ICD-10-CM | POA: Diagnosis not present

## 2022-04-02 DIAGNOSIS — Z0000 Encounter for general adult medical examination without abnormal findings: Secondary | ICD-10-CM | POA: Diagnosis not present

## 2022-04-02 DIAGNOSIS — I2609 Other pulmonary embolism with acute cor pulmonale: Secondary | ICD-10-CM

## 2022-04-02 DIAGNOSIS — E1165 Type 2 diabetes mellitus with hyperglycemia: Secondary | ICD-10-CM

## 2022-04-02 DIAGNOSIS — I10 Essential (primary) hypertension: Secondary | ICD-10-CM

## 2022-04-02 DIAGNOSIS — E785 Hyperlipidemia, unspecified: Secondary | ICD-10-CM

## 2022-04-02 DIAGNOSIS — K8689 Other specified diseases of pancreas: Secondary | ICD-10-CM | POA: Diagnosis not present

## 2022-04-02 DIAGNOSIS — R21 Rash and other nonspecific skin eruption: Secondary | ICD-10-CM | POA: Insufficient documentation

## 2022-04-02 DIAGNOSIS — E1142 Type 2 diabetes mellitus with diabetic polyneuropathy: Secondary | ICD-10-CM

## 2022-04-02 NOTE — Assessment & Plan Note (Signed)
Pt recently changed detergent --- change to scent free detergent ?Do not use bleach --- or rinse second time  ?Refer to gyn  ?Did not responed to cream or abx  ?

## 2022-04-02 NOTE — Patient Instructions (Signed)
Sherry Holloway , ?Thank you for taking time to come for your Medicare Wellness Visit. I appreciate your ongoing commitment to your health goals. Please review the following plan we discussed and let me know if I can assist you in the future.  ? ?Screening recommendations/referrals: ?Colonoscopy: no longer needed ?Mammogram: no longer needed ?Bone Density: N/A ?Recommended yearly ophthalmology/optometry visit for glaucoma screening and checkup ?Recommended yearly dental visit for hygiene and checkup ? ?Vaccinations: ?Influenza vaccine: up to date ?Pneumococcal vaccine: up to date ?Tdap vaccine: up to date ?Shingles vaccine: Due-May obtain vaccine at our office or your local pharmacy.    ?Covid-19:completed ? ?Advanced directives: yes, on file ? ?Conditions/risks identified: see problem list  ? ?Next appointment: Follow up in one year for your annual wellness visit  ? ? ?Preventive Care 30 Years and Older, Female ?Preventive care refers to lifestyle choices and visits with your health care provider that can promote health and wellness. ?What does preventive care include? ?A yearly physical exam. This is also called an annual well check. ?Dental exams once or twice a year. ?Routine eye exams. Ask your health care provider how often you should have your eyes checked. ?Personal lifestyle choices, including: ?Daily care of your teeth and gums. ?Regular physical activity. ?Eating a healthy diet. ?Avoiding tobacco and drug use. ?Limiting alcohol use. ?Practicing safe sex. ?Taking low-dose aspirin every day. ?Taking vitamin and mineral supplements as recommended by your health care provider. ?What happens during an annual well check? ?The services and screenings done by your health care provider during your annual well check will depend on your age, overall health, lifestyle risk factors, and family history of disease. ?Counseling  ?Your health care provider may ask you questions about your: ?Alcohol use. ?Tobacco use. ?Drug  use. ?Emotional well-being. ?Home and relationship well-being. ?Sexual activity. ?Eating habits. ?History of falls. ?Memory and ability to understand (cognition). ?Work and work Statistician. ?Reproductive health. ?Screening  ?You may have the following tests or measurements: ?Height, weight, and BMI. ?Blood pressure. ?Lipid and cholesterol levels. These may be checked every 5 years, or more frequently if you are over 45 years old. ?Skin check. ?Lung cancer screening. You may have this screening every year starting at age 82 if you have a 30-pack-year history of smoking and currently smoke or have quit within the past 15 years. ?Fecal occult blood test (FOBT) of the stool. You may have this test every year starting at age 29. ?Flexible sigmoidoscopy or colonoscopy. You may have a sigmoidoscopy every 5 years or a colonoscopy every 10 years starting at age 51. ?Hepatitis C blood test. ?Hepatitis B blood test. ?Sexually transmitted disease (STD) testing. ?Diabetes screening. This is done by checking your blood sugar (glucose) after you have not eaten for a while (fasting). You may have this done every 1-3 years. ?Bone density scan. This is done to screen for osteoporosis. You may have this done starting at age 40. ?Mammogram. This may be done every 1-2 years. Talk to your health care provider about how often you should have regular mammograms. ?Talk with your health care provider about your test results, treatment options, and if necessary, the need for more tests. ?Vaccines  ?Your health care provider may recommend certain vaccines, such as: ?Influenza vaccine. This is recommended every year. ?Tetanus, diphtheria, and acellular pertussis (Tdap, Td) vaccine. You may need a Td booster every 10 years. ?Zoster vaccine. You may need this after age 33. ?Pneumococcal 13-valent conjugate (PCV13) vaccine. One dose is recommended after  age 60. ?Pneumococcal polysaccharide (PPSV23) vaccine. One dose is recommended after age  11. ?Talk to your health care provider about which screenings and vaccines you need and how often you need them. ?This information is not intended to replace advice given to you by your health care provider. Make sure you discuss any questions you have with your health care provider. ?Document Released: 12/29/2015 Document Revised: 08/21/2016 Document Reviewed: 10/03/2015 ?Elsevier Interactive Patient Education ? 2017 [place]. ? ?Fall Prevention in the Home ?Falls can cause injuries. They can happen to people of all ages. There are many things you can do to make your home safe and to help prevent falls. ?What can I do on the outside of my home? ?Regularly fix the edges of walkways and driveways and fix any cracks. ?Remove anything that might make you trip as you walk through a door, such as a raised step or threshold. ?Trim any bushes or trees on the path to your home. ?Use bright outdoor lighting. ?Clear any walking paths of anything that might make someone trip, such as rocks or tools. ?Regularly check to see if handrails are loose or broken. Make sure that both sides of any steps have handrails. ?Any raised decks and porches should have guardrails on the edges. ?Have any leaves, snow, or ice cleared regularly. ?Use sand or salt on walking paths during winter. ?Clean up any spills in your garage right away. This includes oil or grease spills. ?What can I do in the bathroom? ?Use night lights. ?Install grab bars by the toilet and in the tub and shower. Do not use towel bars as grab bars. ?Use non-skid mats or decals in the tub or shower. ?If you need to sit down in the shower, use a plastic, non-slip stool. ?Keep the floor dry. Clean up any water that spills on the floor as soon as it happens. ?Remove soap buildup in the tub or shower regularly. ?Attach bath mats securely with double-sided non-slip rug tape. ?Do not have throw rugs and other things on the floor that can make you trip. ?What can I do in the  bedroom? ?Use night lights. ?Make sure that you have a light by your bed that is easy to reach. ?Do not use any sheets or blankets that are too big for your bed. They should not hang down onto the floor. ?Have a firm chair that has side arms. You can use this for support while you get dressed. ?Do not have throw rugs and other things on the floor that can make you trip. ?What can I do in the kitchen? ?Clean up any spills right away. ?Avoid walking on wet floors. ?Keep items that you use a lot in easy-to-reach places. ?If you need to reach something above you, use a strong step stool that has a grab bar. ?Keep electrical cords out of the way. ?Do not use floor polish or wax that makes floors slippery. If you must use wax, use non-skid floor wax. ?Do not have throw rugs and other things on the floor that can make you trip. ?What can I do with my stairs? ?Do not leave any items on the stairs. ?Make sure that there are handrails on both sides of the stairs and use them. Fix handrails that are broken or loose. Make sure that handrails are as long as the stairways. ?Check any carpeting to make sure that it is firmly attached to the stairs. Fix any carpet that is loose or worn. ?Avoid having throw rugs  at the top or bottom of the stairs. If you do have throw rugs, attach them to the floor with carpet tape. ?Make sure that you have a light switch at the top of the stairs and the bottom of the stairs. If you do not have them, ask someone to add them for you. ?What else can I do to help prevent falls? ?Wear shoes that: ?Do not have high heels. ?Have rubber bottoms. ?Are comfortable and fit you well. ?Are closed at the toe. Do not wear sandals. ?If you use a stepladder: ?Make sure that it is fully opened. Do not climb a closed stepladder. ?Make sure that both sides of the stepladder are locked into place. ?Ask someone to hold it for you, if possible. ?Clearly mark and make sure that you can see: ?Any grab bars or  handrails. ?First and last steps. ?Where the edge of each step is. ?Use tools that help you move around (mobility aids) if they are needed. These include: ?Canes. ?Walkers. ?Scooters. ?Crutches. ?Turn on the lights when y

## 2022-04-02 NOTE — Patient Instructions (Signed)

## 2022-04-02 NOTE — Assessment & Plan Note (Signed)
Check labs 

## 2022-04-02 NOTE — Assessment & Plan Note (Signed)
Encourage heart healthy diet such as MIND or DASH diet, increase exercise, avoid trans fats, simple carbohydrates and processed foods, consider a krill or fish or flaxseed oil cap daily.  °

## 2022-04-02 NOTE — Assessment & Plan Note (Signed)
Per gi / oncologu ?

## 2022-04-02 NOTE — Assessment & Plan Note (Signed)
Well controlled, no changes to meds. Encouraged heart healthy diet such as the DASH diet and exercise as tolerated.  °

## 2022-04-02 NOTE — Progress Notes (Signed)
? ?Subjective:  ? Sherry Holloway is a 88 y.o. female who presents for an Initial Medicare Annual Wellness Visit. ? ?Review of Systems    ? ?Cardiac Risk Factors include: diabetes mellitus;hypertension;dyslipidemia ? ?   ?Objective:  ?  ?Today's Vitals  ? 04/02/22 1535  ?BP: 128/66  ?Pulse: 72  ?Resp: 16  ?Temp: 99 ?F (37.2 ?C)  ?SpO2: 92%  ?Weight: 154 lb (69.9 kg)  ?Height: '5\' 4"'$  (1.626 m)  ? ?Body mass index is 26.43 kg/m?. ? ? ?  02/11/2022  ?  6:59 AM 08/09/2021  ?  2:14 PM 07/17/2021  ?  2:32 PM 03/17/2021  ?  4:31 AM 03/17/2021  ?  4:04 AM 03/16/2021  ?  2:37 PM 03/05/2021  ?  8:38 AM  ?Advanced Directives  ?Does Patient Have a Medical Advance Directive? Yes Yes Yes  No Yes Yes  ?Type of Corporate treasurer of [place];Living will [place];Living will  [place];Living will [place];Living will [place];Living will  ?Copy of [place] in Chart?   Yes - validated most recent copy scanned in chart (See row information)  No - copy requested  No - copy requested  ?Would patient like information on creating a medical advance directive?  No - Patient declined No - Patient declined No - Patient declined     ? ? ?Current Medications (verified) ?Outpatient Encounter Medications as of 04/02/2022  ?Medication Sig  ? acetaminophen (TYLENOL) 325 MG tablet Take 352-650 mg by mouth every 6 (six) hours as needed for mild pain or moderate pain.  ? bumetanide (BUMEX) 1 MG tablet Take 1 tablet (1 mg total) by mouth daily. Take 1/2 to 1 tablet daily for swelling (Patient taking differently: Take 1 mg by mouth daily.)  ? docusate sodium (COLACE) 100 MG capsule Take 1 capsule (100 mg total) by mouth 2 (two) times daily.  ? empagliflozin (JARDIANCE) 25 MG TABS tablet Take 1 tablet (25 mg total) by mouth daily before breakfast.  ? famciclovir (FAMVIR) 125 MG tablet Take 1 tablet (125 mg total) by mouth daily.  ? glimepiride (AMARYL)  4 MG tablet Take 4 mg by mouth 2 (two) times daily.  ? glucose blood test strip Check blood sugars twice daily  ? latanoprost (XALATAN) 0.005 % ophthalmic solution Place 1 drop into both eyes at bedtime.  ? linaclotide (LINZESS) 145 MCG CAPS capsule Take 1 capsule (145 mcg total) by mouth daily before breakfast.  ? lipase/protease/amylase (CREON) 36000 UNITS CPEP capsule TAKE 1 CAPSULE THREE TIMES A DAY BEFORE MEALS (Patient taking differently: 36,000 Units 2 (two) times daily. With breakfast & with supper)  ? mirtazapine (REMERON) 15 MG tablet TAKE 1 TABLET AT BEDTIME  ? morphine (MS CONTIN) 30 MG 12 hr tablet Take 30 mg by mouth every 12 (twelve) hours.  ? Multiple Vitamin (MULTIVITAMIN WITH MINERALS) TABS tablet Take 1 tablet by mouth daily with supper.  ? omeprazole (PRILOSEC) 20 MG capsule TAKE 1 CAPSULE TWICE A DAY BEFORE MEALS  ? rivaroxaban (XARELTO) 20 MG TABS tablet Take 1 tablet (20 mg total) by mouth daily with supper.  ? senna (SENOKOT) 8.6 MG TABS tablet Take 1 tablet (8.6 mg total) by mouth daily. (Patient taking differently: Take 2 tablets by mouth in the morning.)  ? valsartan (DIOVAN) 40 MG tablet Take 1 tablet (40 mg total) by mouth daily with breakfast.  ? [DISCONTINUED] doxycycline (VIBRA-TABS) 100 MG tablet Take  1 tablet (100 mg total) by mouth 2 (two) times daily.  ? [DISCONTINUED] Iodoquinol-Hydrocortisone-Aloe 1-1.9 % CREA Apply thin layer 3-4 x daily  ? ?No facility-administered encounter medications on file as of 04/02/2022.  ? ? ?Allergies (verified) ?Gramicidin, Sulfa antibiotics, and Wound dressing adhesive  ? ?History: ?Past Medical History:  ?Diagnosis Date  ? Arthritis   ? Choledocholithiasis   ? Diabetes mellitus without complication ([place])   ? Diverticulitis   ? Esophageal cancer ([place]) dx'd 2020  ? GERD (gastroesophageal reflux disease)   ? Hyperlipidemia   ? Hypertension   ? OSA (obstructive sleep apnea)   ? Urine incontinence   ? Uterine cancer ([place])   ? ?Past Surgical History:   ?Procedure Laterality Date  ? ABDOMINAL HYSTERECTOMY  1972  ? APPENDECTOMY    ? BILIARY DILATION  03/05/2021  ? Procedure: BILIARY DILATION;  Surgeon: Rush Landmark Telford Nab., MD;  Location: [facility] ENDOSCOPY;  Service: Gastroenterology;;  ? BILIARY DILATION  11/27/2021  ? Procedure: BILIARY DILATION;  Surgeon: [name], MD;  Location[facility] ENDOSCOPY;  Service: Endoscopy;;  ? BILIARY DILATION  02/11/2022  ? Procedure: BILIARY DILATION;  Surgeon: Rush Landmark Telford Nab., MD;  Location: [facility] ENDOSCOPY;  Service: Gastroenterology;;  ? BILIARY STENT PLACEMENT  11/27/2021  ? Procedure: BILIARY STENT PLACEMENT;  Surgeon: [name], MD;  Location: [facility] ENDOSCOPY;  Service: Endoscopy;;  ? BIOPSY  03/05/2021  ? Procedure: BIOPSY;  Surgeon: Irving Copas., MD;  Location: [facility] ENDOSCOPY;  Service: Gastroenterology;;  ? BIOPSY  11/27/2021  ? Procedure: BIOPSY;  Surgeon: [name], MD;  Location: [facility] ENDOSCOPY;  Service: Endoscopy;;  ? CHOLECYSTECTOMY    ? ENDOSCOPIC RETROGRADE CHOLANGIOPANCREATOGRAPHY (ERCP) WITH PROPOFOL N/A 03/05/2021  ? Procedure: ENDOSCOPIC RETROGRADE CHOLANGIOPANCREATOGRAPHY (ERCP) WITH PROPOFOL;  Surgeon: Rush Landmark Telford Nab., MD;  Location: [facility] ENDOSCOPY;  Service: Gastroenterology;  Laterality: N/A;  ? ENDOSCOPIC RETROGRADE CHOLANGIOPANCREATOGRAPHY (ERCP) WITH PROPOFOL N/A 11/27/2021  ? Procedure: ENDOSCOPIC RETROGRADE CHOLANGIOPANCREATOGRAPHY (ERCP) WITH PROPOFOL;  Surgeon: [name], MD;  Location[facility] ENDOSCOPY;  Service: Endoscopy;  Laterality: N/A;  possible EGD prior  ? ENDOSCOPIC RETROGRADE CHOLANGIOPANCREATOGRAPHY (ERCP) WITH PROPOFOL N/A 02/11/2022  ? Procedure: ENDOSCOPIC RETROGRADE CHOLANGIOPANCREATOGRAPHY (ERCP) WITH PROPOFOL;  Surgeon: Rush Landmark Telford Nab., MD;  Location: [facility] ENDOSCOPY;  Service: Gastroenterology;  Laterality: N/A;  ? ESOPHAGOGASTRODUODENOSCOPY N/A 11/27/2021  ? Procedure: ESOPHAGOGASTRODUODENOSCOPY (EGD);  Surgeon: [name], MD;  Location: [facility]  ENDOSCOPY;  Service: Endoscopy;  Laterality: N/A;  ? ESOPHAGOGASTRODUODENOSCOPY (EGD) WITH PROPOFOL N/A 03/05/2021  ? Procedure: ESOPHAGOGASTRODUODENOSCOPY (EGD) WITH PROPOFOL;  Surgeon: Rush Landmark Telford Nab., MD;  Location: [facility] ENDOSCOPY;  Service: Gastroenterology;  Laterality: N/A;  ? EUS N/A 03/05/2021  ? Procedure: UPPER ENDOSCOPIC ULTRASOUND (EUS) RADIAL;  Surgeon: Rush Landmark Telford Nab., MD;  Location: [facility] ENDOSCOPY;  Service: Gastroenterology;  Laterality: N/A;  ? LITHOTRIPSY  02/11/2022  ? Procedure: LITHOTRIPSY;  Surgeon: Mansouraty, Telford Nab., MD;  Location: [facility] ENDOSCOPY;  Service: Gastroenterology;;  ? LUMBAR LAMINECTOMY    ? REMOVAL OF STONES  03/05/2021  ? Procedure: REMOVAL OF STONES;  Surgeon: Rush Landmark Telford Nab., MD;  Location: [facility] ENDOSCOPY;  Service: Gastroenterology;;  ? REMOVAL OF STONES  11/27/2021  ? Procedure: REMOVAL OF STONES;  Surgeon: [name], MD;  Location: [facility] ENDOSCOPY;  Service: Endoscopy;;  ? REMOVAL OF STONES  02/11/2022  ? Procedure: REMOVAL OF STONES;  Surgeon: Rush Landmark Telford Nab., MD;  Location: [facility] ENDOSCOPY;  Service: Gastroenterology;;  ? SPINAL CORD STIMULATOR IMPLANT    ? SPYGLASS CHOLANGIOSCOPY N/A 03/05/2021  ?  Procedure: SPYGLASS CHOLANGIOSCOPY;  Surgeon: Irving Copas., MD;  Location: [facility] ENDOSCOPY;  Service: Gastroenterology;  Laterality: N/A;  ? SPYGLASS CHOLANGIOSCOPY N/A 02/11/2022  ? Procedure: SPYGLASS CHOLANGIOSCOPY;  Surgeon: Irving Copas., MD;  Location: [facility] ENDOSCOPY;  Service: Gastroenterology;  Laterality: N/A;  ? SPYGLASS LITHOTRIPSY N/A 03/05/2021  ? Procedure: SPYGLASS LITHOTRIPSY;  Surgeon: Irving Copas., MD;  Location: [facility] ENDOSCOPY;  Service: Gastroenterology;  Laterality: N/A;  ? SPYGLASS LITHOTRIPSY N/A 02/11/2022  ? Procedure: SPYGLASS LITHOTRIPSY;  Surgeon: Irving Copas., MD;  Location: [facility] ENDOSCOPY;  Service: Gastroenterology;  Laterality: N/A;  ? STENT REMOVAL  02/11/2022  ? Procedure: STENT REMOVAL;   Surgeon: Irving Copas., MD;  Location: [facility] ENDOSCOPY;  Service: Gastroenterology;;  ? TONSILLECTOMY    ? TOTAL KNEE ARTHROPLASTY Right   ? ?Family History  ?Problem Relation Age of Onset  ? [place]

## 2022-04-02 NOTE — Progress Notes (Signed)
? ?Subjective:  ? ?By signing my name below, I, [name], attest that this documentation has been prepared under the direction and in the presence of Ann Held, DO  04/02/2022 ?  ? ? Patient ID: Sherry Holloway, female    DOB: 1928-11-26, 88 y.o.   MRN: [id number] ? ?Chief Complaint  ?Patient presents with  ? Follow-up  ? referral for bumps in vaginal area  ? ? ?HPI ?Patient is in today for a follow up visit.  ? ?She continues having painful sores on her vagina. She has applied ointment, cleaned and dried the area and found no improvement. She also reports developing itching. She is willing to see a specialist for further treatment. She reports typically wearing cotton underwear and occasionally silk underwear. She denies having any burning or frequency.  ? ?Her blood pressure is doing well during this visit. ?BP Readings from Last 3 Encounters:  ?04/02/22 128/66  ?04/02/22 128/66  ?02/19/22 (!) 128/58  ? ?Pulse Readings from Last 3 Encounters:  ?04/02/22 72  ?04/02/22 72  ?02/19/22 66  ? ? ? ?Past Medical History:  ?Diagnosis Date  ? Arthritis   ? Choledocholithiasis   ? Diabetes mellitus without complication ([place])   ? Diverticulitis   ? Esophageal cancer ([place]) dx'd 2020  ? GERD (gastroesophageal reflux disease)   ? Hyperlipidemia   ? Hypertension   ? OSA (obstructive sleep apnea)   ? Urine incontinence   ? Uterine cancer ([place])   ? ? ?Past Surgical History:  ?Procedure Laterality Date  ? ABDOMINAL HYSTERECTOMY  1972  ? APPENDECTOMY    ? BILIARY DILATION  03/05/2021  ? Procedure: BILIARY DILATION;  Surgeon: Rush Landmark Telford Nab., MD;  Location: [facility] ENDOSCOPY;  Service: Gastroenterology;;  ? BILIARY DILATION  11/27/2021  ? Procedure: BILIARY DILATION;  Surgeon: [name], MD;  Location[facility] ENDOSCOPY;  Service: Endoscopy;;  ? BILIARY DILATION  02/11/2022  ? Procedure: BILIARY DILATION;  Surgeon: Rush Landmark Telford Nab., MD;  Location: [facility] ENDOSCOPY;  Service: Gastroenterology;;  ? BILIARY STENT  PLACEMENT  11/27/2021  ? Procedure: BILIARY STENT PLACEMENT;  Surgeon: [name], MD;  Location: [facility] ENDOSCOPY;  Service: Endoscopy;;  ? BIOPSY  03/05/2021  ? Procedure: BIOPSY;  Surgeon: Irving Copas., MD;  Location: [facility] ENDOSCOPY;  Service: Gastroenterology;;  ? BIOPSY  11/27/2021  ? Procedure: BIOPSY;  Surgeon: [name], MD;  Location: [facility] ENDOSCOPY;  Service: Endoscopy;;  ? CHOLECYSTECTOMY    ? ENDOSCOPIC RETROGRADE CHOLANGIOPANCREATOGRAPHY (ERCP) WITH PROPOFOL N/A 03/05/2021  ? Procedure: ENDOSCOPIC RETROGRADE CHOLANGIOPANCREATOGRAPHY (ERCP) WITH PROPOFOL;  Surgeon: Rush Landmark Telford Nab., MD;  Location: [facility] ENDOSCOPY;  Service: Gastroenterology;  Laterality: N/A;  ? ENDOSCOPIC RETROGRADE CHOLANGIOPANCREATOGRAPHY (ERCP) WITH PROPOFOL N/A 11/27/2021  ? Procedure: ENDOSCOPIC RETROGRADE CHOLANGIOPANCREATOGRAPHY (ERCP) WITH PROPOFOL;  Surgeon: [name], MD;  Location: [facility] ENDOSCOPY;  Service: Endoscopy;  Laterality: N/A;  possible EGD prior  ? ENDOSCOPIC RETROGRADE CHOLANGIOPANCREATOGRAPHY (ERCP) WITH PROPOFOL N/A 02/11/2022  ? Procedure: ENDOSCOPIC RETROGRADE CHOLANGIOPANCREATOGRAPHY (ERCP) WITH PROPOFOL;  Surgeon: Rush Landmark Telford Nab., MD;  Location: [facility] ENDOSCOPY;  Service: Gastroenterology;  Laterality: N/A;  ? ESOPHAGOGASTRODUODENOSCOPY N/A 11/27/2021  ? Procedure: ESOPHAGOGASTRODUODENOSCOPY (EGD);  Surgeon: [name], MD;  Location: [facility] ENDOSCOPY;  Service: Endoscopy;  Laterality: N/A;  ? ESOPHAGOGASTRODUODENOSCOPY (EGD) WITH PROPOFOL N/A 03/05/2021  ? Procedure: ESOPHAGOGASTRODUODENOSCOPY (EGD) WITH PROPOFOL;  Surgeon: Rush Landmark Telford Nab., MD;  Location: [facility] ENDOSCOPY;  Service: Gastroenterology;  Laterality: N/A;  ? EUS N/A 03/05/2021  ? Procedure: UPPER ENDOSCOPIC ULTRASOUND (  EUS) RADIAL;  Surgeon: Rush Landmark Telford Nab., MD;  Location: [facility] ENDOSCOPY;  Service: Gastroenterology;  Laterality: N/A;  ? LITHOTRIPSY  02/11/2022  ? Procedure: LITHOTRIPSY;  Surgeon: Mansouraty,  Telford Nab., MD;  Location: [facility] ENDOSCOPY;  Service: Gastroenterology;;  ? LUMBAR LAMINECTOMY    ? REMOVAL OF STONES  03/05/2021  ? Procedure: REMOVAL OF STONES;  Surgeon: Rush Landmark Telford Nab., MD;  Location: [facility] ENDOSCOPY;  Service: Gastroenterology;;  ? REMOVAL OF STONES  11/27/2021  ? Procedure: REMOVAL OF STONES;  Surgeon: [name], MD;  Location: [facility] ENDOSCOPY;  Service: Endoscopy;;  ? REMOVAL OF STONES  02/11/2022  ? Procedure: REMOVAL OF STONES;  Surgeon: Rush Landmark Telford Nab., MD;  Location: [facility] ENDOSCOPY;  Service: Gastroenterology;;  ? SPINAL CORD STIMULATOR IMPLANT    ? SPYGLASS CHOLANGIOSCOPY N/A 03/05/2021  ? Procedure: SPYGLASS CHOLANGIOSCOPY;  Surgeon: Irving Copas., MD;  Location: [facility] ENDOSCOPY;  Service: Gastroenterology;  Laterality: N/A;  ? SPYGLASS CHOLANGIOSCOPY N/A 02/11/2022  ? Procedure: SPYGLASS CHOLANGIOSCOPY;  Surgeon: Irving Copas., MD;  Location: [facility] ENDOSCOPY;  Service: Gastroenterology;  Laterality: N/A;  ? SPYGLASS LITHOTRIPSY N/A 03/05/2021  ? Procedure: SPYGLASS LITHOTRIPSY;  Surgeon: Irving Copas., MD;  Location: [facility] ENDOSCOPY;  Service: Gastroenterology;  Laterality: N/A;  ? SPYGLASS LITHOTRIPSY N/A 02/11/2022  ? Procedure: SPYGLASS LITHOTRIPSY;  Surgeon: Irving Copas., MD;  Location: [facility] ENDOSCOPY;  Service: Gastroenterology;  Laterality: N/A;  ? STENT REMOVAL  02/11/2022  ? Procedure: STENT REMOVAL;  Surgeon: Irving Copas., MD;  Location: [facility] ENDOSCOPY;  Service: Gastroenterology;;  ? TONSILLECTOMY    ? TOTAL KNEE ARTHROPLASTY Right   ? ? ?Family History  ?Problem Relation Age of Onset  ? Diabetes Mother   ? Heart disease Father   ? Lung cancer Sister   ? Lung cancer Son   ? Hypertension Son   ? Hypertension Daughter   ? Diabetes Son   ? Hypertension Son   ? Diabetes Daughter   ? Hypertension Daughter   ? ? ?Social History  ? ?Socioeconomic History  ? Marital status: Widowed  ?  Spouse name: Not on file  ? Number of children: 4  ?  Years of education: Not on file  ? Highest education level: Not on file  ?Occupational History  ? Occupation: Retired Quarry manager  ?Tobacco Use  ? Smoking status: Former  ?  Packs/day: 1.00  ?  Years: 35.00  ?  Pack years: 35.00  ?  Types: Cigarettes  ?  Quit date: 01/17/1989  ?  Years since quitting: 33.2  ? Smokeless tobacco: Never  ?Vaping Use  ? Vaping Use: Never used  ?Substance and Sexual Activity  ? Alcohol use: Not Currently  ? Drug use: Never  ? Sexual activity: Not Currently  ?Other Topics Concern  ? Not on file  ?Social History Narrative  ? Just moved from [place]--- and moved into an apt 2.4 miles from her daughter   ? ?Social Determinants of Health  ? ?Financial Resource Strain: Low Risk   ? Difficulty of Paying Living Expenses: Not hard at all  ?Food Insecurity: No Food Insecurity  ? Worried About Charity fundraiser in the Last Year: Never true  ? Ran Out of Food in the Last Year: Never true  ?Transportation Needs: No Transportation Needs  ? Lack of Transportation (Medical): No  ? Lack of Transportation (Non-Medical): No  ?Physical Activity: Sufficiently Active  ? Days of Exercise per Week: 5 days  ? Minutes of Exercise per Session:  60 min  ?Stress: No Stress Concern Present  ? Feeling of Stress : Not at all  ?Social Connections: Moderately Isolated  ? Frequency of Communication with Friends and Family: More than three times a week  ? Frequency of Social Gatherings with Friends and Family: Three times a week  ? Attends Religious Services: More than 4 times per year  ? Active Member of Clubs or Organizations: No  ? Attends Archivist Meetings: Never  ? Marital Status: Widowed  ?Intimate Partner Violence: Not At Risk  ? Fear of Current or Ex-Partner: No  ? Emotionally Abused: No  ? Physically Abused: No  ? Sexually Abused: No  ? ? ?Outpatient Medications Prior to Visit  ?Medication Sig Dispense Refill  ? acetaminophen (TYLENOL) 325 MG tablet Take 352-650 mg by mouth every 6 (six) hours as needed for mild  pain or moderate pain.    ? bumetanide (BUMEX) 1 MG tablet Take 1 tablet (1 mg total) by mouth daily. Take 1/2 to 1 tablet daily for swelling (Patient taking differently: Take 1 mg by mouth daily.) 90 tablet

## 2022-04-03 LAB — COMPREHENSIVE METABOLIC PANEL
ALT: 16 U/L (ref 0–35)
AST: 13 U/L (ref 0–37)
Albumin: 3.9 g/dL (ref 3.5–5.2)
Alkaline Phosphatase: 89 U/L (ref 39–117)
BUN: 23 mg/dL (ref 6–23)
CO2: 28 mEq/L (ref 19–32)
Calcium: 9.4 mg/dL (ref 8.4–10.5)
Chloride: 102 mEq/L (ref 96–112)
Creatinine, Ser: 1.03 mg/dL (ref 0.40–1.20)
GFR: 47.32 mL/min — ABNORMAL LOW (ref >60.00)
Glucose, Bld: 153 mg/dL — ABNORMAL HIGH (ref 70–99)
Potassium: 3.6 mEq/L (ref 3.5–5.1)
Sodium: 138 mEq/L (ref 135–145)
Total Bilirubin: 0.3 mg/dL (ref 0.2–1.2)
Total Protein: 6.7 g/dL (ref 6.0–8.3)

## 2022-04-03 LAB — LIPID PANEL
Cholesterol: 146 mg/dL (ref 0–200)
HDL: 56 mg/dL (ref >39.00)
NonHDL: 89.85
Total CHOL/HDL Ratio: 3
Triglycerides: 226 mg/dL — ABNORMAL HIGH (ref 0.0–149.0)
VLDL: 45.2 mg/dL — ABNORMAL HIGH (ref 0.0–40.0)

## 2022-04-03 LAB — HEMOGLOBIN A1C: Hgb A1c MFr Bld: 7.6 % — ABNORMAL HIGH (ref 4.6–6.5)

## 2022-04-03 LAB — LDL CHOLESTEROL, DIRECT: Direct LDL: 71 mg/dL

## 2022-05-01 ENCOUNTER — Other Ambulatory Visit: Payer: Self-pay | Admitting: Family Medicine

## 2022-05-28 ENCOUNTER — Other Ambulatory Visit: Payer: Self-pay | Admitting: Family Medicine

## 2022-06-23 ENCOUNTER — Other Ambulatory Visit: Payer: Self-pay | Admitting: Family Medicine

## 2022-06-24 ENCOUNTER — Other Ambulatory Visit: Payer: Self-pay | Admitting: Family Medicine

## 2022-06-24 DIAGNOSIS — K2100 Gastro-esophageal reflux disease with esophagitis, without bleeding: Secondary | ICD-10-CM

## 2022-07-09 ENCOUNTER — Inpatient Hospital Stay: Payer: Medicare Other | Admitting: Oncology

## 2022-07-15 ENCOUNTER — Inpatient Hospital Stay: Payer: Medicare Other | Attending: Oncology | Admitting: Oncology

## 2022-07-15 VITALS — BP 145/53 | HR 66 | Temp 98.2°F | Resp 18 | Ht 64.0 in | Wt 154.4 lb

## 2022-07-15 DIAGNOSIS — E119 Type 2 diabetes mellitus without complications: Secondary | ICD-10-CM | POA: Insufficient documentation

## 2022-07-15 DIAGNOSIS — C154 Malignant neoplasm of middle third of esophagus: Secondary | ICD-10-CM

## 2022-07-15 DIAGNOSIS — Z79899 Other long term (current) drug therapy: Secondary | ICD-10-CM | POA: Diagnosis not present

## 2022-07-15 DIAGNOSIS — C159 Malignant neoplasm of esophagus, unspecified: Secondary | ICD-10-CM | POA: Diagnosis present

## 2022-07-15 DIAGNOSIS — I10 Essential (primary) hypertension: Secondary | ICD-10-CM | POA: Diagnosis not present

## 2022-07-15 DIAGNOSIS — K219 Gastro-esophageal reflux disease without esophagitis: Secondary | ICD-10-CM | POA: Insufficient documentation

## 2022-07-15 NOTE — Progress Notes (Signed)
Sherry Holloway OFFICE PROGRESS NOTE   Diagnosis: Esophagus cancer  INTERVAL HISTORY:   [name] returns as scheduled.  She is here with her daughter.  No complaint.  Good appetite.  No dysphagia.  No abdominal pain or jaundice.  She underwent an ERCP and removal of the bile duct stent in February.  Objective:  Vital signs in last 24 hours:  Blood pressure (!) 145/53, pulse 66, temperature 98.2 F (36.8 C), temperature source Oral, resp. rate 18, height '5\' 4"'$  (1.626 m), weight 154 lb 6.4 oz (70 kg), SpO2 100 %.    HEENT: Neck without mass Lymphatics: No cervical, supraclavicular, axillary, or inguinal nodes Resp: End inspiratory rhonchi at left upper posterior chest, no respiratory distress Cardio: Regular rate and rhythm GI: Nontender, no mass, no hepatosplenomegaly Vascular: No leg edema  Skin: Radiation hyperpigmentation at the left upper back   Lab Results:  Lab Results  Component Value Date   WBC 7.0 01/01/2022   HGB 11.5 (L) 01/01/2022   HCT 36.3 01/01/2022   MCV 87.7 01/01/2022   PLT 209 01/01/2022   NEUTROABS 4.6 01/01/2022    CMP  Lab Results  Component Value Date   NA 138 04/02/2022   K 3.6 04/02/2022   CL 102 04/02/2022   CO2 28 04/02/2022   GLUCOSE 153 (H) 04/02/2022   BUN 23 04/02/2022   CREATININE 1.03 04/02/2022   CALCIUM 9.4 04/02/2022   PROT 6.7 04/02/2022   ALBUMIN 3.9 04/02/2022   AST 13 04/02/2022   ALT 16 04/02/2022   ALKPHOS 89 04/02/2022   BILITOT 0.3 04/02/2022   GFRNONAA 52 (L) 01/01/2022    No results found for: "CEA1", "CEA", "NWG956", "CA125"  Lab Results  Component Value Date   INR 1.3 (H) 11/26/2021   LABPROT 15.9 (H) 11/26/2021    Imaging:  No results found.  Medications: I have reviewed the patient's current medications.   Assessment/Plan: Esophagus cancer Upper EUS 03/05/2021-small submucosal ulcerating nodule with no bleeding and no stigmata of recent bleeding in the midesophagus, 29 to 31  cm from the incisors.  Lesion nonobstructing and not circumferential.  Patchy mildly erythematous mucosa without bleeding was found in the gastric body and gastric antrum.  The esophagus lesion was staged T1sm N0 MX.  Pancreatic parenchymal abnormalities consisting of atrophy were noted in the entire pancreas.  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  There was dilation of the common bile duct.  No malignant appearing lymph nodes were visualized in the middle paraesophageal mediastinum, lower paraesophageal mediastinum, celiac region, peripancreatic region and porta hepatis region.  Esophagus biopsy showed invasive carcinoma.  Stomach biopsy showed reactive gastropathy; no intestinal metaplasia, dysplasia or malignancy. PET 04/02/2021- low-level hypermetabolism associated with a 0.7 cm nodule at the mid thoracic esophagus, hypermetabolic cystic pancreas head mass with diffuse intrahepatic and extra fatigability duct dilatation compatible with malignant main duct IPMN, no evidence of metastatic disease CT chest and pelvis 03/16/2021-nonocclusive right lower lobe pulmonary embolus extending into segmental branches of the right lower lobe.  Nodular area at the left lung base with bandlike changes that extend from this area on the current study and on the prior study.  This may represent a combination of scarring and atelectasis.  Measures 1.4 x 1.2 cm, previously 2.1 x 1.9 cm.  Suggestion of slight interval decrease in size as well  of a right lower lobe process with nodular features.  Persistent marked biliary duct distention. Radiation/Xeloda 07/26/2021-09/04/2021 Endoscopy at the time of ERCP 11/27/2021-no residual tumor seen CT scan of the abdomen on 01/25/2021 due to history of a pancreatic mass.  The CT showed gross intra and extrahepatic biliary ductal  dilatation, the central common bile duct appeared to contain a poorly calcified calculus or soft tissue nodule measuring approximately 1.9 cm.  There was diffuse atrophy of the pancreatic parenchyma and dilatation of the main pancreatic duct.  There was a hypodense mass or gross pancreatic ductal dilatation within the pancreatic head measuring 3.5 x 2.0 x 2.0 cm.  Nodular appearing consolidation of the dependent left lung base measuring 2.4 x 1.8 cm, nonspecific. CT abdomen/pelvis twelve 1122-3.4 cm cystic lesion in the pancreas head/uncinate, intrahepatic and extrapelvic ductal dilatation with evidence of choledocholithiasis in the distal common duct                               ERCP by Dr. Rush Landmark on 03/05/2021.  The major papilla was located entirely within a pantaloon diverticulum.  Prior biliary sphincterotomy appeared open.  Pancreatic duct fishmouth deformity noted.  Filling defects consistent with stones and sludge were seen on the cholangiogram.  The entire main bile duct was severely dilated.  Choledocholithiasis was found.  Biliary sphincteroplasty performed.  Electrohydraulic lithotripsy of a remaining stone performed.   Pulmonary embolus on chest CT 03/15/2021-on Xarelto.  Bilateral lower extremity venous Doppler study with no significant femoral-popliteal DVT.  Limited assessment of the calf veins.  5.7 cm left popliteal fossa Baker's cyst. GERD Hypertension Diabetes OSA Subclavian arterial stenosis History of uterine cancer Chronic back pain maintained on MS Contin, followed by Dr. Nelva Bush IPMN- diagnosed in July 2020 Biliary stones-status post ERCP/sphincterotomy 07/14/2019 for removal of stones Admission 11/25/2021 with recurrent choledocholithiasis, status post ERCP and stone removal 11/27/2021, bile duct stent placed 02/11/2022-ERCP with removal of biliary stent and removal of choledocholithiasis        Disposition: [name] is in clinical remission from esophagus cancer.   She will call for dysphagia or new GI symptoms.  She will will follow-up with Dr. Rush Landmark if she develops recurrent biliary symptoms.  She will return for an office visit in 9 months.  Betsy Coder, MD  07/15/2022  10:53 AM

## 2022-08-05 ENCOUNTER — Encounter: Payer: Self-pay | Admitting: Family Medicine

## 2022-08-05 ENCOUNTER — Other Ambulatory Visit: Payer: Self-pay | Admitting: Family Medicine

## 2022-08-05 DIAGNOSIS — K2100 Gastro-esophageal reflux disease with esophagitis, without bleeding: Secondary | ICD-10-CM

## 2022-08-05 DIAGNOSIS — K5901 Slow transit constipation: Secondary | ICD-10-CM

## 2022-08-06 ENCOUNTER — Telehealth: Payer: Self-pay

## 2022-08-06 MED ORDER — FAMCICLOVIR 125 MG PO TABS: 125.0000 mg | ORAL_TABLET | Freq: Every day | ORAL | 1 refills | Status: DC

## 2022-08-06 MED ORDER — GLIMEPIRIDE 4 MG PO TABS: 4.0000 mg | ORAL_TABLET | Freq: Two times a day (BID) | ORAL | 1 refills | Status: DC

## 2022-08-06 MED ORDER — OMEPRAZOLE 20 MG PO CPDR: 20.0000 mg | DELAYED_RELEASE_CAPSULE | Freq: Two times a day (BID) | ORAL | 1 refills | Status: DC

## 2022-08-06 MED ORDER — PANCRELIPASE (LIP-PROT-AMYL) 36000-114000 UNITS PO CPEP: 36000.0000 [IU] | ORAL_CAPSULE | Freq: Three times a day (TID) | ORAL | 1 refills | Status: DC

## 2022-08-06 MED ORDER — EMPAGLIFLOZIN 25 MG PO TABS: 25.0000 mg | ORAL_TABLET | Freq: Every day | ORAL | 1 refills | Status: DC

## 2022-08-06 MED ORDER — BUMETANIDE 1 MG PO TABS: 0.5000 mg | ORAL_TABLET | Freq: Every day | ORAL | 1 refills | Status: DC | PRN

## 2022-08-06 MED ORDER — MIRTAZAPINE 15 MG PO TABS: 15.0000 mg | ORAL_TABLET | Freq: Every day | ORAL | 1 refills | Status: DC

## 2022-08-06 MED ORDER — VALSARTAN 40 MG PO TABS: 40.0000 mg | ORAL_TABLET | Freq: Every day | ORAL | 1 refills | Status: DC

## 2022-08-06 MED ORDER — LINACLOTIDE 145 MCG PO CAPS: 145.0000 ug | ORAL_CAPSULE | Freq: Every day | ORAL | 1 refills | Status: DC

## 2022-08-06 NOTE — Telephone Encounter (Signed)
Roma Schanz- MD Contact Type Call Who Is Calling Patient / Member / Family / Caregiver Caller Name Corwin Levins Caller Phone Number [phone] Patient Name Sherry Holloway Patient DOB [birth year] Call Type Message Only Information Provided Reason for Call Request for General Office Information Initial Comment Caller states her mother was supposed to get a [facility]ill of [facility]. States [facility] says they have not gotten any request. States they do not want to be triaged. They would like to just send a message over to the office. Additional Comment Office hours provided. Medication name Valsartan'40mg'$ 

## 2022-08-06 NOTE — Telephone Encounter (Signed)
Rx refilled this morning

## 2022-09-27 ENCOUNTER — Encounter: Payer: Self-pay | Admitting: Family Medicine

## 2022-09-27 ENCOUNTER — Ambulatory Visit (INDEPENDENT_AMBULATORY_CARE_PROVIDER_SITE_OTHER): Payer: Medicare Other | Admitting: Family Medicine

## 2022-09-27 VITALS — BP 142/50 | HR 66 | Temp 97.7°F | Resp 18 | Ht 64.0 in | Wt 157.8 lb

## 2022-09-27 DIAGNOSIS — K8689 Other specified diseases of pancreas: Secondary | ICD-10-CM | POA: Diagnosis not present

## 2022-09-27 DIAGNOSIS — E1169 Type 2 diabetes mellitus with other specified complication: Secondary | ICD-10-CM

## 2022-09-27 DIAGNOSIS — E785 Hyperlipidemia, unspecified: Secondary | ICD-10-CM

## 2022-09-27 DIAGNOSIS — I10 Essential (primary) hypertension: Secondary | ICD-10-CM

## 2022-09-27 DIAGNOSIS — E119 Type 2 diabetes mellitus without complications: Secondary | ICD-10-CM | POA: Diagnosis not present

## 2022-09-27 NOTE — Assessment & Plan Note (Signed)
Well controlled, no changes to meds. Encouraged heart healthy diet such as the DASH diet and exercise as tolerated.  °

## 2022-09-27 NOTE — Assessment & Plan Note (Signed)
Encourage heart healthy diet such as MIND or DASH diet, increase exercise, avoid trans fats, simple carbohydrates and processed foods, consider a krill or fish or flaxseed oil cap daily.  °

## 2022-09-27 NOTE — Progress Notes (Addendum)
Subjective:   By signing my name below, I, Roma Schanz, attest that this documentation has been prepared under the direction and in the presence of Roma Schanz, 09/27/2022.     Patient ID: Sherry Holloway, female    DOB: 04-18-1928, 88 y.o.   MRN: [id number]  Chief Complaint  Patient presents with   Hypertension   Hyperlipidemia   Diabetes   Follow-up    Hypertension Pertinent negatives include no blurred vision, chest pain, headaches, malaise/fatigue, palpitations or shortness of breath.  Hyperlipidemia Pertinent negatives include no chest pain or shortness of breath.  Diabetes Pertinent negatives for hypoglycemia include no dizziness, headaches or nervousness/anxiousness. Pertinent negatives for diabetes include no blurred vision and no chest pain.   Patient is in today for an office visit.  Mood- Patient reports to have a stable mood. Diabetes- She reports to regularly check blood sugar at home. She reports that her blood sugar is mostly under control. Lab Results  Component Value Date   HGBA1C 7.9 (H) 10/02/2022   Immunizations- She has received an influenza vaccine this visit and is planning on receiving new Covid-19 booster. Weight-Patient has gained weight since the last visit. Wt Readings from Last 3 Encounters:  09/27/22 157 lb 12.8 oz (71.6 kg)  07/15/22 154 lb 6.4 oz (70 kg)  04/02/22 154 lb (69.9 kg)   Hypertension- Patients blood pressure is high this visit. BP Readings from Last 3 Encounters:  09/27/22 (!) 142/50  07/15/22 (!) 145/53  04/02/22 128/66   Pulse Readings from Last 3 Encounters:  09/27/22 66  07/15/22 66  04/02/22 72   Exercise- She reports that she does not participate in regular exercise, but is active. Diet- Patient reports that she consumes enough food in the day. Pitt- Patient has trace pitt on left ankle. Past Medical History:  Diagnosis Date   Arthritis    Choledocholithiasis    Diabetes mellitus without  complication ([place])    Diverticulitis    Esophageal cancer ([place]) dx'd 2020   GERD (gastroesophageal reflux disease)    Hyperlipidemia    Hypertension    OSA (obstructive sleep apnea)    Urine incontinence    Uterine cancer [facility])     Past Surgical History:  Procedure Laterality Date   ABDOMINAL HYSTERECTOMY  1972   APPENDECTOMY     BILIARY DILATION  03/05/2021   Procedure: BILIARY DILATION;  Surgeon: Irving Copas., MD;  Location: [facility];  Service: Gastroenterology;;   BILIARY DILATION  11/27/2021   Procedure: BILIARY DILATION;  Surgeon: [name], MD;  [facility];  Service: Endoscopy;;   BILIARY DILATION  02/11/2022   Procedure: BILIARY DILATION;  Surgeon: Irving Copas., MD;  Location: [facility];  Service: Gastroenterology;;   BILIARY STENT PLACEMENT  11/27/2021   Procedure: BILIARY STENT PLACEMENT;  Surgeon: [name], MD;  Location: [place];  Service: Endoscopy;;   BIOPSY  03/05/2021   Procedure: BIOPSY;  Surgeon: Irving Copas., MD;  Location: [facility];  Service: Gastroenterology;;   BIOPSY  11/27/2021   Procedure: BIOPSY;  Surgeon: [name], MD;  Location: [facility]OSCOPY;  Service: Endoscopy;;   CHOLECYSTECTOMY     ENDOSCOPIC RETROGRADE CHOLANGIOPANCREATOGRAPHY (ERCP) WITH PROPOFOL N/A 03/05/2021   Procedure: ENDOSCOPIC RETROGRADE CHOLANGIOPANCREATOGRAPHY (ERCP) WITH PROPOFOL;  Surgeon: Irving Copas., MD;  Location: [facility];  Service: Gastroenterology;  Laterality: N/A;   ENDOSCOPIC RETROGRADE CHOLANGIOPANCREATOGRAPHY (ERCP) WITH PROPOFOL N/A 11/27/2021   Procedure: ENDOSCOPIC RETROGRADE CHOLANGIOPANCREATOGRAPHY (ERCP) WITH PROPOFOL;  Surgeon: [name], MD;  Location: [facility] ENDOSCOPY;  Service: Endoscopy;  Laterality: N/A;  possible EGD prior   ENDOSCOPIC RETROGRADE CHOLANGIOPANCREATOGRAPHY (ERCP) WITH PROPOFOL N/A 02/11/2022   Procedure: ENDOSCOPIC RETROGRADE CHOLANGIOPANCREATOGRAPHY  (ERCP) WITH PROPOFOL;  Surgeon: Rush Landmark Telford Nab., MD;  Location: [facility];  Service: Gastroenterology;  Laterality: N/A;   ESOPHAGOGASTRODUODENOSCOPY N/A 11/27/2021   Procedure: ESOPHAGOGASTRODUODENOSCOPY (EGD);  Surgeon: [name], MD;  Location: [facility] ENDOSCOPY;  Service: Endoscopy;  Laterality: N/A;   ESOPHAGOGASTRODUODENOSCOPY (EGD) WITH PROPOFOL N/A 03/05/2021   Procedure: ESOPHAGOGASTRODUODENOSCOPY (EGD) WITH PROPOFOL;  Surgeon: Rush Landmark Telford Nab., MD;  Location: [facility];  Service: Gastroenterology;  Laterality: N/A;   EUS N/A 03/05/2021   Procedure: UPPER ENDOSCOPIC ULTRASOUND (EUS) RADIAL;  Surgeon: Irving Copas., MD;  Location: [facility];  Service: Gastroenterology;  Laterality: N/A;   LITHOTRIPSY  02/11/2022   Procedure: LITHOTRIPSY;  Surgeon: Mansouraty, Telford Nab., MD;  Location: [facility];  Service: Gastroenterology;;   LUMBAR LAMINECTOMY     REMOVAL OF STONES  03/05/2021   Procedure: REMOVAL OF STONES;  Surgeon: Irving Copas., MD;  Location: [facility];  Service: Gastroenterology;;   REMOVAL OF STONES  11/27/2021   Procedure: REMOVAL OF STONES;  Surgeon: [name], MD;  Location: [place];  Service: Endoscopy;;   REMOVAL OF STONES  02/11/2022   Procedure: REMOVAL OF STONES;  Surgeon: Irving Copas., MD;  Location: [facility];  Service: Gastroenterology;;   SPINAL CORD STIMULATOR IMPLANT     SPYGLASS CHOLANGIOSCOPY N/A 03/05/2021   Procedure: EGBTDVVO CHOLANGIOSCOPY;  Surgeon: Irving Copas., MD;  Location: [facility];  Service: Gastroenterology;  Laterality: N/A;   SPYGLASS CHOLANGIOSCOPY N/A 02/11/2022   Procedure: SPYGLASS CHOLANGIOSCOPY;  Surgeon: Irving Copas., MD;  Location: [facility];  Service: Gastroenterology;  Laterality: N/A;   SPYGLASS LITHOTRIPSY N/A 03/05/2021   Procedure: HYWVPXTG LITHOTRIPSY;  Surgeon: Irving Copas., MD;  Location: [facility];  Service:  Gastroenterology;  Laterality: N/A;   SPYGLASS LITHOTRIPSY N/A 02/11/2022   Procedure: GYIRSWNI LITHOTRIPSY;  Surgeon: Irving Copas., MD;  Location: [facility];  Service: Gastroenterology;  Laterality: N/A;   STENT REMOVAL  02/11/2022   Procedure: STENT REMOVAL;  Surgeon: Rush Landmark Telford Nab., MD;  Location: [facility];  Service: Gastroenterology;;   TONSILLECTOMY     TOTAL KNEE ARTHROPLASTY Right     Family History  Problem Relation Age of Onset   Diabetes Mother    Heart disease Father    Lung cancer Sister    Lung cancer Son    Hypertension Son    Hypertension Daughter    Diabetes Son    Hypertension Son    Diabetes Daughter    Hypertension Daughter     Social History   Socioeconomic History   Marital status: Widowed    Spouse name: Not on file   Number of children: 4   Years of education: Not on file   Highest education level: Not on file  Occupational History   Occupation: Retired Quarry manager  Tobacco Use   Smoking status: Former    Packs/day: 1.00    Years: 35.00    Total pack years: 35.00    Types: Cigarettes    Quit date: 01/17/1989    Years since quitting: 33.7   Smokeless tobacco: Never  Vaping Use   Vaping Use: Never used  Substance and Sexual Activity   Alcohol use: Not Currently   Drug use: Never   Sexual activity: Not Currently  Other Topics Concern  Not on file  Social History Narrative   Just moved from [place]--- and moved into an apt 2.4 miles from her daughter    Social Determinants of Health   Financial Resource Strain: Low Risk  (04/02/2022)   Overall Financial Resource Strain (CARDIA)    Difficulty of Paying Living Expenses: Not hard at all  Food Insecurity: No Food Insecurity (04/02/2022)   Hunger Vital Sign    Worried About Running Out of Food in the Last Year: Never true    Ran Out of Food in the Last Year: Never true  Transportation Needs: No Transportation Needs (04/02/2022)   PRAPARE - Hydrologist  (Medical): No    Lack of Transportation (Non-Medical): No  Physical Activity: Sufficiently Active (04/02/2022)   Exercise Vital Sign    Days of Exercise per Week: 5 days    Minutes of Exercise per Session: 60 min  Stress: No Stress Concern Present (04/02/2022)   [place]    Feeling of Stress : Not at all  Social Connections: Moderately Isolated (04/02/2022)   Social Connection and Isolation Panel [NHANES]    Frequency of Communication with Friends and Family: More than three times a week    Frequency of Social Gatherings with Friends and Family: Three times a week    Attends Religious Services: More than 4 times per year    Active Member of Clubs or Organizations: No    Attends Archivist Meetings: Never    Marital Status: Widowed  Intimate Partner Violence: Not At Risk (07/17/2021)   Humiliation, Afraid, Rape, and Kick questionnaire    Fear of Current or Ex-Partner: No    Emotionally Abused: No    Physically Abused: No    Sexually Abused: No    Outpatient Medications Prior to Visit  Medication Sig Dispense Refill   acetaminophen (TYLENOL) 325 MG tablet Take 352-650 mg by mouth every 6 (six) hours as needed for mild pain or moderate pain.     bumetanide (BUMEX) 1 MG tablet Take 0.5-1 tablets (0.5-1 mg total) by mouth daily as needed (swelling). 90 tablet 1   docusate sodium (COLACE) 100 MG capsule Take 1 capsule (100 mg total) by mouth 2 (two) times daily. 10 capsule 0   empagliflozin (JARDIANCE) 25 MG TABS tablet Take 1 tablet (25 mg total) by mouth daily before breakfast. 90 tablet 1   famciclovir (FAMVIR) 125 MG tablet Take 1 tablet (125 mg total) by mouth daily. 90 tablet 1   glimepiride (AMARYL) 4 MG tablet Take 1 tablet (4 mg total) by mouth 2 (two) times daily. 180 tablet 1   glucose blood test strip Check blood sugars twice daily 200 each 12   latanoprost (XALATAN) 0.005 % ophthalmic solution Place 1  drop into both eyes at bedtime. 2.5 mL 2   linaclotide (LINZESS) 145 MCG CAPS capsule Take 1 capsule (145 mcg total) by mouth daily before breakfast. 90 capsule 1   lipase/protease/amylase (CREON) 36000 UNITS CPEP capsule Take 1 capsule (36,000 Units total) by mouth 3 (three) times daily before meals. 270 capsule 1   mirtazapine (REMERON) 15 MG tablet Take 1 tablet (15 mg total) by mouth at bedtime. 90 tablet 1   morphine (MS CONTIN) 30 MG 12 hr tablet Take 30 mg by mouth every 12 (twelve) hours.     Multiple Vitamin (MULTIVITAMIN WITH MINERALS) TABS tablet Take 1 tablet by mouth daily with supper.  omeprazole (PRILOSEC) 20 MG capsule Take 1 capsule (20 mg total) by mouth 2 (two) times daily before a meal. 180 capsule 1   rivaroxaban (XARELTO) 20 MG TABS tablet Take 1 tablet (20 mg total) by mouth daily with supper. 90 tablet 3   senna (SENOKOT) 8.6 MG TABS tablet Take 1 tablet (8.6 mg total) by mouth daily. (Patient taking differently: Take 2 tablets by mouth in the morning.) 120 tablet 0   valsartan (DIOVAN) 40 MG tablet Take 1 tablet (40 mg total) by mouth daily with breakfast. 90 tablet 1   No facility-administered medications prior to visit.    Allergies  Allergen Reactions   Gramicidin Other (See Comments)    Unknown reaction type   Sulfa Antibiotics Rash   Wound Dressing Adhesive Rash    Review of Systems  Constitutional:  Negative for fever and malaise/fatigue.  HENT:  Negative for congestion.   Eyes:  Negative for blurred vision.  Respiratory:  Negative for shortness of breath.   Cardiovascular:  Negative for chest pain, palpitations and leg swelling.  Gastrointestinal:  Negative for abdominal pain, blood in stool and nausea.  Genitourinary:  Negative for dysuria and frequency.  Musculoskeletal:  Negative for falls.  Skin:  Negative for rash.  Neurological:  Negative for dizziness, loss of consciousness and headaches.  Endo/Heme/Allergies:  Negative for environmental  allergies.  Psychiatric/Behavioral:  Negative for depression. The patient is not nervous/anxious.        Objective:    Physical Exam Vitals and nursing note reviewed.  Constitutional:      General: She is not in acute distress.    Appearance: Normal appearance. She is well-developed. She is not ill-appearing.  HENT:     Head: Normocephalic and atraumatic.     Right Ear: External ear normal.     Left Ear: External ear normal.  Eyes:     Extraocular Movements: Extraocular movements intact.     Conjunctiva/sclera: Conjunctivae normal.     Pupils: Pupils are equal, round, and reactive to light.  Neck:     Thyroid: No thyromegaly.     Vascular: No carotid bruit or JVD.  Cardiovascular:     Rate and Rhythm: Normal rate and regular rhythm.     Heart sounds: Normal heart sounds. No murmur heard.    No gallop.  Pulmonary:     Effort: Pulmonary effort is normal. No respiratory distress.     Breath sounds: Normal breath sounds. No wheezing or rales.  Chest:     Chest wall: No tenderness.  Musculoskeletal:     Cervical back: Normal range of motion and neck supple.     Comments: (+) trace pitt on left ankle  Skin:    General: Skin is warm and dry.  Neurological:     General: No focal deficit present.     Mental Status: She is alert and oriented to person, place, and time. Mental status is at baseline.  Psychiatric:        Mood and Affect: Mood normal.        Behavior: Behavior normal.        Thought Content: Thought content normal.        Judgment: Judgment normal.     BP (!) 142/50 (BP Location: Right Arm, Patient Position: Sitting, Cuff Size: Normal)   Pulse 66   Temp 97.7 F (36.5 C) (Oral)   Resp 18   Ht '5\' 4"'$  (1.626 m)   Wt 157 lb 12.8 oz (71.6 kg)  SpO2 92%   BMI 27.09 kg/m  Wt Readings from Last 3 Encounters:  09/27/22 157 lb 12.8 oz (71.6 kg)  07/15/22 154 lb 6.4 oz (70 kg)  04/02/22 154 lb (69.9 kg)    Diabetic Foot Exam - Simple   Simple Foot  Form Diabetic Foot exam was performed with the following findings: Yes 09/27/2022  2:11 PM  Visual Inspection No deformities, no ulcerations, no other skin breakdown bilaterally: Yes Sensation Testing Intact to touch and monofilament testing bilaterally: Yes Pulse Check Posterior Tibialis and Dorsalis pulse intact bilaterally: Yes Comments    Lab Results  Component Value Date   WBC 7.7 10/02/2022   HGB 11.1 (L) 10/02/2022   HCT 34.1 (L) 10/02/2022   PLT 210.0 10/02/2022   GLUCOSE 75 10/02/2022   CHOL 135 10/02/2022   TRIG 109.0 10/02/2022   HDL 56.90 10/02/2022   LDLDIRECT 71.0 04/02/2022   LDLCALC 57 10/02/2022   ALT 17 10/02/2022   AST 11 10/02/2022   NA 138 10/02/2022   K 4.1 10/02/2022   CL 104 10/02/2022   CREATININE 1.05 10/02/2022   BUN 27 (H) 10/02/2022   CO2 28 10/02/2022   INR 1.3 (H) 11/26/2021   HGBA1C 7.9 (H) 10/02/2022   MICROALBUR <0.7 02/07/2022    No results found for: "TSH" Lab Results  Component Value Date   WBC 7.7 10/02/2022   HGB 11.1 (L) 10/02/2022   HCT 34.1 (L) 10/02/2022   MCV 85.4 10/02/2022   PLT 210.0 10/02/2022   Lab Results  Component Value Date   NA 138 10/02/2022   K 4.1 10/02/2022   CO2 28 10/02/2022   GLUCOSE 75 10/02/2022   BUN 27 (H) 10/02/2022   CREATININE 1.05 10/02/2022   BILITOT 0.3 10/02/2022   ALKPHOS 73 10/02/2022   AST 11 10/02/2022   ALT 17 10/02/2022   PROT 6.4 10/02/2022   ALBUMIN 3.9 10/02/2022   CALCIUM 9.5 10/02/2022   ANIONGAP 10 01/01/2022   GFR 46.08 (L) 10/02/2022   Lab Results  Component Value Date   CHOL 135 10/02/2022   Lab Results  Component Value Date   HDL 56.90 10/02/2022   Lab Results  Component Value Date   LDLCALC 57 10/02/2022   Lab Results  Component Value Date   TRIG 109.0 10/02/2022   Lab Results  Component Value Date   CHOLHDL 2 10/02/2022   Lab Results  Component Value Date   HGBA1C 7.9 (H) 10/02/2022       Assessment & Plan:   Problem List Items Addressed  This Visit       Unprioritized   Pancreatic mass    Per gi      Hypertension    Well controlled, no changes to meds. Encouraged heart healthy diet such as the DASH diet and exercise as tolerated.       Relevant Orders   CBC with Differential/Platelet (Completed)   Comprehensive metabolic panel (Completed)   Hemoglobin A1c (Completed)   Lipid panel (Completed)   Hyperlipidemia associated with type 2 diabetes mellitus ([place])    Encourage heart healthy diet such as MIND or DASH diet, increase exercise, avoid trans fats, simple carbohydrates and processed foods, consider a krill or fish or flaxseed oil cap daily.       Relevant Orders   CBC with Differential/Platelet (Completed)   Comprehensive metabolic panel (Completed)   Hemoglobin A1c (Completed)   Lipid panel (Completed)   Hyperlipidemia    Encourage heart healthy diet such as MIND  or DASH diet, increase exercise, avoid trans fats, simple carbohydrates and processed foods, consider a krill or fish or flaxseed oil cap daily.       Diabetes mellitus without complication ([place])    EQAS3M to be done , minimize simple carbs. Increase exercise as tolerated. Continue current meds       Other Visit Diagnoses     Type 2 diabetes mellitus with other specified complication, without long-term current use of insulin ([place])    -  Primary   Relevant Orders   CBC with Differential/Platelet (Completed)   Comprehensive metabolic panel (Completed)   Hemoglobin A1c (Completed)   Lipid panel (Completed)       No orders of the defined types were placed in this encounter.   I, Roma Schanz, personally preformed the services described in this documentation.  All medical record entries made by the scribe were at my direction and in my presence.  I have reviewed the chart and discharge instructions (if applicable) and agree that the record reflects my personal performance and is accurate and complete. 09/27/2022.  I,Castin Donaghue R Lowne  [name],acting as a scribe for [employer], DO.,have documented all relevant documentation on the behalf of Ann Held, DO,as directed by  Ann Held, DO while in the presence of Ann Held, DO.    Ann Held, DO

## 2022-09-27 NOTE — Assessment & Plan Note (Signed)
hgba1c to be done, minimize simple carbs. Increase exercise as tolerated. Continue current meds  

## 2022-09-27 NOTE — Patient Instructions (Signed)

## 2022-09-27 NOTE — Assessment & Plan Note (Signed)
Per gi  

## 2022-10-02 ENCOUNTER — Other Ambulatory Visit (INDEPENDENT_AMBULATORY_CARE_PROVIDER_SITE_OTHER): Payer: Medicare Other

## 2022-10-02 DIAGNOSIS — I10 Essential (primary) hypertension: Secondary | ICD-10-CM | POA: Diagnosis not present

## 2022-10-02 DIAGNOSIS — E785 Hyperlipidemia, unspecified: Secondary | ICD-10-CM | POA: Diagnosis not present

## 2022-10-02 DIAGNOSIS — E1169 Type 2 diabetes mellitus with other specified complication: Secondary | ICD-10-CM | POA: Diagnosis not present

## 2022-10-03 LAB — CBC WITH DIFFERENTIAL/PLATELET
Basophils Absolute: 0.1 10*3/uL (ref 0.0–0.1)
Basophils Relative: 1.3 % (ref 0.0–3.0)
Eosinophils Absolute: 0.1 10*3/uL (ref 0.0–0.7)
Eosinophils Relative: 1.5 % (ref 0.0–5.0)
HCT: 34.1 % — ABNORMAL LOW (ref 36.0–46.0)
Hemoglobin: 11.1 g/dL — ABNORMAL LOW (ref 12.0–15.0)
Lymphocytes Relative: 26.8 % (ref 12.0–46.0)
Lymphs Abs: 2 10*3/uL (ref 0.7–4.0)
MCHC: 32.5 g/dL (ref 30.0–36.0)
MCV: 85.4 fl (ref 78.0–100.0)
Monocytes Absolute: 0.5 10*3/uL (ref 0.1–1.0)
Monocytes Relative: 7 % (ref 3.0–12.0)
Neutro Abs: 4.9 10*3/uL (ref 1.4–7.7)
Neutrophils Relative %: 63.4 % (ref 43.0–77.0)
Platelets: 210 10*3/uL (ref 150.0–400.0)
RBC: 4 Mil/uL (ref 3.87–5.11)
RDW: 15.1 % (ref 11.5–15.5)
WBC: 7.7 10*3/uL (ref 4.0–10.5)

## 2022-10-03 LAB — COMPREHENSIVE METABOLIC PANEL
ALT: 17 U/L (ref 0–35)
AST: 11 U/L (ref 0–37)
Albumin: 3.9 g/dL (ref 3.5–5.2)
Alkaline Phosphatase: 73 U/L (ref 39–117)
BUN: 27 mg/dL — ABNORMAL HIGH (ref 6–23)
CO2: 28 mEq/L (ref 19–32)
Calcium: 9.5 mg/dL (ref 8.4–10.5)
Chloride: 104 mEq/L (ref 96–112)
Creatinine, Ser: 1.05 mg/dL (ref 0.40–1.20)
GFR: 46.08 mL/min — ABNORMAL LOW (ref >60.00)
Glucose, Bld: 75 mg/dL (ref 70–99)
Potassium: 4.1 mEq/L (ref 3.5–5.1)
Sodium: 138 mEq/L (ref 135–145)
Total Bilirubin: 0.3 mg/dL (ref 0.2–1.2)
Total Protein: 6.4 g/dL (ref 6.0–8.3)

## 2022-10-03 LAB — HEMOGLOBIN A1C: Hgb A1c MFr Bld: 7.9 % — ABNORMAL HIGH (ref 4.6–6.5)

## 2022-10-03 LAB — LIPID PANEL
Cholesterol: 135 mg/dL (ref 0–200)
HDL: 56.9 mg/dL (ref >39.00)
LDL Cholesterol: 57 mg/dL (ref 0–99)
NonHDL: 78.35
Total CHOL/HDL Ratio: 2
Triglycerides: 109 mg/dL (ref 0.0–149.0)
VLDL: 21.8 mg/dL (ref 0.0–40.0)

## 2022-10-28 ENCOUNTER — Other Ambulatory Visit: Payer: Self-pay | Admitting: Family Medicine

## 2022-11-20 ENCOUNTER — Other Ambulatory Visit: Payer: Self-pay | Admitting: Family Medicine

## 2022-12-10 ENCOUNTER — Encounter: Payer: Self-pay | Admitting: Family Medicine

## 2022-12-11 ENCOUNTER — Telehealth: Payer: Self-pay

## 2022-12-11 NOTE — Telephone Encounter (Signed)
PA approved.   MPNTIR:[id number]:[id number];Review Type:Prior Auth;Coverage Start Date:11/11/2022;Coverage End Date:12/11/2023;

## 2022-12-11 NOTE — Telephone Encounter (Signed)
PA initiated via Covermymeds; KEY:  [id number]. Awaiting determination.

## 2023-01-19 ENCOUNTER — Other Ambulatory Visit: Payer: Self-pay | Admitting: Family Medicine

## 2023-01-19 DIAGNOSIS — K5901 Slow transit constipation: Secondary | ICD-10-CM

## 2023-01-20 ENCOUNTER — Encounter: Payer: Self-pay | Admitting: Family Medicine

## 2023-01-20 ENCOUNTER — Other Ambulatory Visit: Payer: Self-pay | Admitting: Family Medicine

## 2023-01-22 ENCOUNTER — Other Ambulatory Visit: Payer: Self-pay | Admitting: Family

## 2023-01-22 DIAGNOSIS — R29898 Other symptoms and signs involving the musculoskeletal system: Secondary | ICD-10-CM

## 2023-01-22 DIAGNOSIS — R2689 Other abnormalities of gait and mobility: Secondary | ICD-10-CM

## 2023-01-30 ENCOUNTER — Ambulatory Visit (INDEPENDENT_AMBULATORY_CARE_PROVIDER_SITE_OTHER): Payer: Medicare Other | Admitting: Family Medicine

## 2023-01-30 ENCOUNTER — Encounter: Payer: Self-pay | Admitting: Family Medicine

## 2023-01-30 VITALS — BP 136/60 | HR 60 | Temp 97.8°F | Resp 18 | Ht 64.0 in | Wt 153.6 lb

## 2023-01-30 DIAGNOSIS — M549 Dorsalgia, unspecified: Secondary | ICD-10-CM

## 2023-01-30 DIAGNOSIS — G8929 Other chronic pain: Secondary | ICD-10-CM | POA: Diagnosis not present

## 2023-01-30 DIAGNOSIS — R2689 Other abnormalities of gait and mobility: Secondary | ICD-10-CM | POA: Diagnosis not present

## 2023-01-30 DIAGNOSIS — E1142 Type 2 diabetes mellitus with diabetic polyneuropathy: Secondary | ICD-10-CM | POA: Diagnosis not present

## 2023-01-30 DIAGNOSIS — Z9181 History of falling: Secondary | ICD-10-CM | POA: Diagnosis not present

## 2023-01-30 NOTE — Patient Instructions (Signed)
Understanding Your Risk for Falls Each year, millions of people have serious injuries from falls. It is important to understand your risk for falling. Talk with your health care provider about your risk and what you can do to lower it. There are actions you can take at home to lower your risk and prevent falls. If you do have a serious fall, make sure to tell your health care provider. Falling once raises your risk of falling again. How can falls affect me? Serious injuries from falls are common. These include: Broken bones, such as hip fractures. Head injuries, such as traumatic brain injuries (TBI) or concussion. A fear of falling can cause you to avoid activities and stay at home. This can make your muscles weaker and actually raise your risk for a fall. What can increase my risk? There are a number of risk factors that increase your risk for falling. The more risk factors you have, the higher your risk of falling. Serious injuries from a fall happen most often to people older than age 65. Children and young adults ages 15-29 are also at higher risk. Common risk factors include: Weakness in the lower body. Lack (deficiency) of vitamin D. Being generally weak or confused due to long-term (chronic) illness. Dizziness or balance problems. Poor vision. Medicines that cause dizziness or drowsiness. These can include medicines for your blood pressure, heart, anxiety, insomnia, or edema, as well as pain medicines and muscle relaxants. Other risk factors include: Drinking alcohol. Having had a fall in the past. Having depression. Having foot pain or wearing improper footwear. Working at a dangerous job. Having any of the following in your home: Tripping hazards, such as floor clutter or loose rugs. Poor lighting. Pets. Having dementia or memory loss. What actions can I take to lower my risk of falling?     Physical activity Maintain physical fitness. Do strength and balance exercises.  Consider taking a regular class to build strength and balance. Yoga and tai chi are good options. Vision Have your eyes checked every year and your vision prescription updated as needed. Walking aids and footwear Wear nonskid shoes. Do not wear high heels. Do not walk around the house in socks or slippers. Use a cane or walker as told by your health care provider. Home safety Attach secure railings on both sides of your stairs. Install grab bars for your tub, shower, and toilet. Use a bath mat in your tub or shower. Use good lighting in all rooms. Keep a flashlight near your bed. Make sure there is a clear path from your bed to the bathroom. Use night-lights. Do not use throw rugs. Make sure all carpeting is taped or tacked down securely. Remove all clutter from walkways and stairways, including extension cords. Repair uneven or broken steps. Avoid walking on icy or slippery surfaces. Walk on the grass instead of on icy or slick sidewalks. Use ice melt to get rid of ice on walkways. Use a cordless phone. Questions to ask your health care provider Can you help me check my risk for a fall? Do any of my medicines make me more likely to fall? Should I take a vitamin D supplement? What exercises can I do to improve my strength and balance? Should I make an appointment to have my vision checked? Do I need a bone density test to check for weak bones or osteoporosis? Would it help to use a cane or a walker? Where to find more information Centers for Disease Control and Prevention,   [place]AD[place]: www.cdc.gov Community-Based Fall Prevention Programs: www.cdc.gov National Institute on Aging: www.nia.nih.gov Contact a health care provider if: You fall at home. You are afraid of falling at home. You feel weak, drowsy, or dizzy. Summary Serious injuries from a fall happen most often to people older than age 65. Children and young adults ages 15-29 are also at higher risk. Talk with your health care  provider about your risks for falling and how to lower those risks. Taking certain precautions at home can lower your risk for falling. If you fall, always tell your health care provider. This information is not intended to replace advice given to you by your health care provider. Make sure you discuss any questions you have with your health care provider. Document Revised: 07/05/2020 Document Reviewed: 07/05/2020 Elsevier Patient Education  2023 Elsevier Inc.  

## 2023-01-30 NOTE — Addendum Note (Signed)
Addended by: Roma Schanz R on: 01/30/2023 04:05 PM   Modules accepted: Orders

## 2023-01-30 NOTE — Assessment & Plan Note (Signed)
Home health referral sent to [facility]

## 2023-01-30 NOTE — Progress Notes (Addendum)
Subjective:   By signing my name below, I, [name], attest that this documentation has been prepared under the direction and in the presence of Ann Held, DO. 01/30/2023   Patient ID: Sherry Holloway, female    DOB: 1928-01-18, 89 y.o.   MRN: [id number]  Chief Complaint  Patient presents with   [place]    Pt here discuss to gett [facility] and states GI concern has resolved. Pt states having balance issues. No falls but has had stumbles.     HPI Patient is in today for a office visit.   She is requesting a referral for home health care. Her daughter reports she is tripping more frequently at her home. She has fallen backwards twice but there was a wall to support her so she did not get injured. She denies having any dizziness.  She is stumbling. They are requesing home health for PT.     Past Medical History:  Diagnosis Date   Arthritis    Choledocholithiasis    Diabetes mellitus without complication ([place])    Diverticulitis    Esophageal cancer ([place]) dx'd 2020   GERD (gastroesophageal reflux disease)    Hyperlipidemia    Hypertension    OSA (obstructive sleep apnea)    Urine incontinence    Uterine cancer [facility])     Past Surgical History:  Procedure Laterality Date   ABDOMINAL HYSTERECTOMY  1972   APPENDECTOMY     BILIARY DILATION  03/05/2021   Procedure: BILIARY DILATION;  Surgeon: Irving Copas., MD;  Location: [facility];  Service: Gastroenterology;;   BILIARY DILATION  11/27/2021   Procedure: BILIARY DILATION;  Surgeon: [name], MD;  Location: [place];  Service: Endoscopy;;   BILIARY DILATION  02/11/2022   Procedure: BILIARY DILATION;  Surgeon: Irving Copas., MD;  Location: [facility];  Service: Gastroenterology;;   BILIARY STENT PLACEMENT  11/27/2021   Procedure: BILIARY STENT PLACEMENT;  Surgeon: [name], MD;  Location: [place];  Service: Endoscopy;;   BIOPSY  03/05/2021   Procedure: BIOPSY;  Surgeon:  Irving Copas., MD;  Location: [facility];  Service: Gastroenterology;;   BIOPSY  11/27/2021   Procedure: BIOPSY;  Surgeon: [name], MD;  Location: [facility] ENDOSCOPY;  Service: Endoscopy;;   CHOLECYSTECTOMY     ENDOSCOPIC RETROGRADE CHOLANGIOPANCREATOGRAPHY (ERCP) WITH PROPOFOL N/A 03/05/2021   Procedure: ENDOSCOPIC RETROGRADE CHOLANGIOPANCREATOGRAPHY (ERCP) WITH PROPOFOL;  Surgeon: Irving Copas., MD;  Location: [facility] ENDOSCOPY;  Service: Gastroenterology;  Laterality: N/A;   ENDOSCOPIC RETROGRADE CHOLANGIOPANCREATOGRAPHY (ERCP) WITH PROPOFOL N/A 11/27/2021   Procedure: ENDOSCOPIC RETROGRADE CHOLANGIOPANCREATOGRAPHY (ERCP) WITH PROPOFOL;  Surgeon: [name], MD;  Location: [facility] ENDOSCOPY;  Service: Endoscopy;  Laterality: N/A;  possible EGD prior   ENDOSCOPIC RETROGRADE CHOLANGIOPANCREATOGRAPHY (ERCP) WITH PROPOFOL N/A 02/11/2022   Procedure: ENDOSCOPIC RETROGRADE CHOLANGIOPANCREATOGRAPHY (ERCP) WITH PROPOFOL;  Surgeon: Rush Landmark Telford Nab., MD;  Location: [facility];  Service: Gastroenterology;  Laterality: N/A;   ESOPHAGOGASTRODUODENOSCOPY N/A 11/27/2021   Procedure: ESOPHAGOGASTRODUODENOSCOPY (EGD);  Surgeon: [name], MD;  Location: [facility] ENDOSCOPY;  Service: Endoscopy;  Laterality: N/A;   ESOPHAGOGASTRODUODENOSCOPY (EGD) WITH PROPOFOL N/A 03/05/2021   Procedure: ESOPHAGOGASTRODUODENOSCOPY (EGD) WITH PROPOFOL;  Surgeon: Rush Landmark Telford Nab., MD;  Location: [facility];  Service: Gastroenterology;  Laterality: N/A;   EUS N/A 03/05/2021   Procedure: UPPER ENDOSCOPIC ULTRASOUND (EUS) RADIAL;  Surgeon: Irving Copas., MD;  Location: [facility];  Service: Gastroenterology;  Laterality: N/A;   LITHOTRIPSY  02/11/2022   Procedure:  LITHOTRIPSY;  Surgeon: Mansouraty, Telford Nab., MD;  Location: [facility];  Service: Gastroenterology;;   LUMBAR LAMINECTOMY     REMOVAL OF STONES  03/05/2021   Procedure: REMOVAL OF STONES;  Surgeon: Irving Copas.,  MD;  Location: [facility];  Service: Gastroenterology;;   REMOVAL OF STONES  11/27/2021   Procedure: REMOVAL OF STONES;  Surgeon: [name], MD;  Location: [place];  Service: Endoscopy;;   REMOVAL OF STONES  02/11/2022   Procedure: REMOVAL OF STONES;  Surgeon: Irving Copas., MD;  Location: [facility];  Service: Gastroenterology;;   SPINAL CORD STIMULATOR IMPLANT     SPYGLASS CHOLANGIOSCOPY N/A 03/05/2021   Procedure: [id number] CHOLANGIOSCOPY;  Surgeon: Irving Copas., MD;  Location: [facility];  Service: Gastroenterology;  Laterality: N/A;   SPYGLASS CHOLANGIOSCOPY N/A 02/11/2022   Procedure: SPYGLASS CHOLANGIOSCOPY;  Surgeon: Irving Copas., MD;  Location[facility];  Service: Gastroenterology;  Laterality: N/A;   SPYGLASS LITHOTRIPSY N/A 03/05/2021   Procedure: [id number] LITHOTRIPSY;  Surgeon: Irving Copas., MD;  Location: [facility];  Service: Gastroenterology;  Laterality: N/A;   SPYGLASS LITHOTRIPSY N/A 02/11/2022   Procedure: [id number] LITHOTRIPSY;  Surgeon: Irving Copas., MD;  Location: [facility];  Service: Gastroenterology;  Laterality: N/A;   STENT REMOVAL  02/11/2022   Procedure: STENT REMOVAL;  Surgeon: Rush Landmark Telford Nab., MD;  Location: [facility];  Service: Gastroenterology;;   TONSILLECTOMY     TOTAL KNEE ARTHROPLASTY Right     Family History  Problem Relation Age of Onset   Diabetes Mother    Heart disease Father    Lung cancer Sister    Lung cancer Son    Hypertension Son    Hypertension Daughter    Diabetes Son    Hypertension Son    Diabetes Daughter    Hypertension Daughter     Social History   Socioeconomic History   Marital status: Widowed    Spouse name: Not on file   Number of children: 4   Years of education: Not on file   Highest education level: Not on file  Occupational History   Occupation: Retired Quarry manager  Tobacco Use   Smoking status: Former    Packs/day: 1.00    Years:  35.00    Total pack years: 35.00    Types: Cigarettes    Quit date: 01/17/1989    Years since quitting: 34.0   Smokeless tobacco: Never  Vaping Use   Vaping Use: Never used  Substance and Sexual Activity   Alcohol use: Not Currently   Drug use: Never   Sexual activity: Not Currently  Other Topics Concern   Not on file  Social History Narrative   Just moved from [place]--- and moved into an apt 2.4 miles from her daughter    Social Determinants of Health   Financial Resource Strain: [place]  (04/02/2022)   Overall Financial Resource Strain (CARDIA)    Difficulty of Paying Living Expenses: Not hard at all  Food Insecurity: No Food Insecurity (04/02/2022)   Hunger Vital Sign    Worried About Running Out of Food in the Last Year: Never true    [place] in the Last Year: Never true  Transportation Needs: No Transportation Needs (04/02/2022)   PRAPARE - Hydrologist (Medical): No    Lack of Transportation (Non-Medical): No  Physical Activity: Sufficiently Active (04/02/2022)   Exercise Vital Sign    Days of Exercise per Week: 5 days  Minutes of Exercise per Session: 60 min  Stress: No Stress Concern Present (04/02/2022)   [place]    Feeling of Stress : Not at all  Social Connections: Moderately Isolated (04/02/2022)   Social Connection and Isolation Panel [NHANES]    Frequency of Communication with Friends and Family: More than three times a week    Frequency of Social Gatherings with Friends and Family: Three times a week    Attends Religious Services: More than 4 times per year    Active Member of Clubs or Organizations: No    Attends Archivist Meetings: Never    Marital Status: Widowed  Intimate Partner Violence: Not At Risk (07/17/2021)   Humiliation, Afraid, Rape, and Kick questionnaire    Fear of Current or Ex-Partner: No    Emotionally Abused: No    Physically  Abused: No    Sexually Abused: No    Outpatient Medications Prior to Visit  Medication Sig Dispense Refill   acetaminophen (TYLENOL) 325 MG tablet Take 352-650 mg by mouth every 6 (six) hours as needed for mild pain or moderate pain.     bumetanide (BUMEX) 1 MG tablet Take 0.5-1 tablets (0.5-1 mg total) by mouth daily as needed (swelling). 90 tablet 1   docusate sodium (COLACE) 100 MG capsule Take 1 capsule (100 mg total) by mouth 2 (two) times daily. 10 capsule 0   famciclovir (FAMVIR) 125 MG tablet Take 1 tablet (125 mg total) by mouth daily. 90 tablet 1   glimepiride (AMARYL) 4 MG tablet Take 1 tablet (4 mg total) by mouth 2 (two) times daily. 180 tablet 1   glucose blood (FREESTYLE LITE) test strip CHECK BLOOD SUGAR TWICE A DAY 200 strip 12   latanoprost (XALATAN) 0.005 % ophthalmic solution Place 1 drop into both eyes at bedtime. 2.5 mL 2   linaclotide (LINZESS) 145 MCG CAPS capsule TAKE 1 CAPSULE DAILY BEFORE BREAKFAST 90 capsule 1   lipase/protease/amylase (CREON) 36000 UNITS CPEP capsule Take 1 capsule (36,000 Units total) by mouth 3 (three) times daily before meals. 270 capsule 1   mirtazapine (REMERON) 15 MG tablet Take 1 tablet (15 mg total) by mouth at bedtime. 90 tablet 1   morphine (MS CONTIN) 30 MG 12 hr tablet Take 30 mg by mouth every 12 (twelve) hours.     Multiple Vitamin (MULTIVITAMIN WITH MINERALS) TABS tablet Take 1 tablet by mouth daily with supper.     omeprazole (PRILOSEC) 20 MG capsule Take 1 capsule (20 mg total) by mouth 2 (two) times daily before a meal. 180 capsule 1   senna (SENOKOT) 8.6 MG TABS tablet Take 1 tablet (8.6 mg total) by mouth daily. (Patient taking differently: Take 2 tablets by mouth in the morning.) 120 tablet 0   valsartan (DIOVAN) 40 MG tablet TAKE 1 TABLET DAILY WITH BREAKFAST 90 tablet 1   XARELTO 20 MG TABS tablet TAKE 1 TABLET DAILY WITH SUPPER, START ONLY AFTER THE STARTER PACK HAS BEEN COMPLETED 90 tablet 3   empagliflozin (JARDIANCE) 25 MG  TABS tablet Take 1 tablet (25 mg total) by mouth daily before breakfast. 90 tablet 1   No facility-administered medications prior to visit.    Allergies  Allergen Reactions   Gramicidin Other (See Comments)    Unknown reaction type   Sulfa Antibiotics Rash   Wound Dressing Adhesive Rash    Review of Systems  Constitutional:  Negative for fever and malaise/fatigue.  HENT:  Negative  for congestion.   Eyes:  Negative for blurred vision.  Respiratory:  Negative for cough and shortness of breath.   Cardiovascular:  Negative for chest pain, palpitations and leg swelling.  Gastrointestinal:  Negative for vomiting.  Musculoskeletal:  Positive for falls. Negative for back pain.  Skin:  Negative for rash.  Neurological:  Negative for dizziness, loss of consciousness and headaches.       Objective:    Physical Exam Vitals and nursing note reviewed.  Constitutional:      General: She is not in acute distress.    Appearance: Normal appearance. She is not ill-appearing.  HENT:     Head: Normocephalic and atraumatic.     Right Ear: External ear normal.     Left Ear: External ear normal.  Eyes:     Extraocular Movements: Extraocular movements intact.     Pupils: Pupils are equal, round, and reactive to light.  Cardiovascular:     Rate and Rhythm: Normal rate and regular rhythm.     Heart sounds: Normal heart sounds. No murmur heard.    No gallop.  Pulmonary:     Effort: Pulmonary effort is normal. No respiratory distress.     Breath sounds: Normal breath sounds. No wheezing or rales.  Skin:    General: Skin is warm and dry.  Neurological:     Mental Status: She is alert and oriented to person, place, and time.  Psychiatric:        Judgment: Judgment normal.     BP 136/60 (BP Location: Right Arm, Patient Position: Sitting, Cuff Size: Normal)   Pulse 60   Temp 97.8 F (36.6 C) (Oral)   Resp 18   Ht '5\' 4"'$  (1.626 m)   Wt 153 lb 9.6 oz (69.7 kg)   SpO2 97%   BMI 26.37 kg/m   Wt Readings from Last 3 Encounters:  01/30/23 153 lb 9.6 oz (69.7 kg)  09/27/22 157 lb 12.8 oz (71.6 kg)  07/15/22 154 lb 6.4 oz (70 kg)       Assessment & Plan:  Balance disorder Assessment & Plan: Home health referral sent to [facility]    At high risk for injury related to fall Assessment & Plan: Refer to [facility] for pt     Chronic bilateral back pain, unspecified back location Assessment & Plan: High fall risk Pt requesting PT at home for strengthening and balance  Refer to [facility] for pt    Type 2 diabetes mellitus with diabetic polyneuropathy, without long-term current use of insulin ([place]) Assessment & Plan: Lab Results  Component Value Date   HGBA1C 7.9 (H) 10/02/2022   Home health referral placed       I, Ann Held, DO, personally preformed the services described in this documentation.  All medical record entries made by the scribe were at my direction and in my presence.  I have reviewed the chart and discharge instructions (if applicable) and agree that the record reflects my personal performance and is accurate and complete. 01/30/2023   I,[name],acting as a scribe for Ann Held, DO.,have documented all relevant documentation on the behalf of Ann Held, DO,as directed by  Ann Held, DO while in the presence of Ann Held, DO.   Ann Held, DO

## 2023-01-30 NOTE — Addendum Note (Signed)
Addended by: Roma Schanz R on: 01/30/2023 04:06 PM   Modules accepted: Orders

## 2023-02-03 ENCOUNTER — Other Ambulatory Visit: Payer: Self-pay | Admitting: Family Medicine

## 2023-02-06 ENCOUNTER — Telehealth: Payer: Self-pay | Admitting: *Deleted

## 2023-02-06 DIAGNOSIS — Z9181 History of falling: Secondary | ICD-10-CM | POA: Insufficient documentation

## 2023-02-06 NOTE — Assessment & Plan Note (Addendum)
High fall risk Pt requesting PT at home for strengthening and balance  Refer to [facility] for pt

## 2023-02-06 NOTE — Telephone Encounter (Signed)
The nurse, Annabell Sabal, from [facility] health called and stated that she is unable to use just balance disorder as diagnosis.  She would like to see if you are able to addend ov note from 01/30/23 to include more diagnosis?  You can fax ov note to [phone].  Her number if any questions is [phone].

## 2023-02-06 NOTE — Assessment & Plan Note (Signed)
Refer to [facility] for pt

## 2023-02-07 NOTE — Telephone Encounter (Signed)
Addended office note faxed over to [facility] health.

## 2023-02-10 NOTE — Telephone Encounter (Signed)
Sherry Holloway  from [facility] calling again about note 01/30/23.  She got the diagnoses but note needs to show they were actually discussed.  She has to have this for Medicare purposes.  Is there anyway to addend again?

## 2023-02-12 NOTE — Assessment & Plan Note (Signed)
Lab Results  Component Value Date   HGBA1C 7.9 (H) 10/02/2022   Home health referral placed

## 2023-02-17 ENCOUNTER — Telehealth: Payer: Self-pay | Admitting: Family Medicine

## 2023-02-17 NOTE — Telephone Encounter (Signed)
Caller/Agency: Gracy Racer [facility] Nurse Manager) [place] Number: [phone] Requesting OT/PT/Skilled Nursing/Social Work/Speech Therapy: Skilled Nursing + OT Frequency: Evals for both

## 2023-02-18 NOTE — Telephone Encounter (Signed)
Verbal given 

## 2023-02-20 DIAGNOSIS — Z87891 Personal history of nicotine dependence: Secondary | ICD-10-CM

## 2023-02-20 DIAGNOSIS — Z7901 Long term (current) use of anticoagulants: Secondary | ICD-10-CM

## 2023-02-20 DIAGNOSIS — E119 Type 2 diabetes mellitus without complications: Secondary | ICD-10-CM | POA: Diagnosis not present

## 2023-02-20 DIAGNOSIS — Z604 Social exclusion and rejection: Secondary | ICD-10-CM

## 2023-02-20 DIAGNOSIS — I10 Essential (primary) hypertension: Secondary | ICD-10-CM | POA: Diagnosis not present

## 2023-02-20 DIAGNOSIS — Z9181 History of falling: Secondary | ICD-10-CM

## 2023-02-20 DIAGNOSIS — K219 Gastro-esophageal reflux disease without esophagitis: Secondary | ICD-10-CM | POA: Diagnosis not present

## 2023-02-20 DIAGNOSIS — M1990 Unspecified osteoarthritis, unspecified site: Secondary | ICD-10-CM | POA: Diagnosis not present

## 2023-02-20 DIAGNOSIS — G4733 Obstructive sleep apnea (adult) (pediatric): Secondary | ICD-10-CM

## 2023-02-20 DIAGNOSIS — E785 Hyperlipidemia, unspecified: Secondary | ICD-10-CM

## 2023-02-20 DIAGNOSIS — Z556 Problems related to health literacy: Secondary | ICD-10-CM

## 2023-02-20 DIAGNOSIS — R32 Unspecified urinary incontinence: Secondary | ICD-10-CM

## 2023-02-20 DIAGNOSIS — Z8501 Personal history of malignant neoplasm of esophagus: Secondary | ICD-10-CM

## 2023-02-20 DIAGNOSIS — Z8542 Personal history of malignant neoplasm of other parts of uterus: Secondary | ICD-10-CM

## 2023-02-20 DIAGNOSIS — R29898 Other symptoms and signs involving the musculoskeletal system: Secondary | ICD-10-CM

## 2023-02-20 DIAGNOSIS — Z7984 Long term (current) use of oral hypoglycemic drugs: Secondary | ICD-10-CM

## 2023-02-21 ENCOUNTER — Telehealth: Payer: Self-pay | Admitting: Family Medicine

## 2023-02-21 NOTE — Telephone Encounter (Signed)
Ria Comment from [facility] stated pt is refusing skilled nursing. She states she is having a lot of knee and hip pain but does not want a nurse to help her.

## 2023-03-03 ENCOUNTER — Encounter: Payer: Self-pay | Admitting: Family Medicine

## 2023-03-03 ENCOUNTER — Other Ambulatory Visit: Payer: Self-pay | Admitting: Family Medicine

## 2023-03-03 ENCOUNTER — Telehealth: Payer: Self-pay | Admitting: Family Medicine

## 2023-03-03 DIAGNOSIS — H9193 Unspecified hearing loss, bilateral: Secondary | ICD-10-CM

## 2023-03-03 NOTE — Telephone Encounter (Signed)
Medi [place], [place]  Home health eval was done, he wants to report that there is no more need for OT but will continue PT.

## 2023-03-26 ENCOUNTER — Telehealth: Payer: Self-pay | Admitting: Family Medicine

## 2023-03-26 NOTE — Telephone Encounter (Signed)
Copied from CRM [id number]. Topic: Medicare AWV >> Mar 26, 2023  1:42 PM Payton Doughty wrote: Reason for CRM: Called patient to schedule Medicare Annual Wellness Visit (AWV). Left message for patient to call back and schedule Medicare Annual Wellness Visit (AWV).  Last date of AWV: 04/02/22  Please schedule an appointment at any time with Kandis Cocking, LPN .  If any questions, please contact me.  Thank you ,  Verlee Rossetti; Care Guide Ambulatory Clinical Support Mentor l [facility] Direct Dial: [phone]

## 2023-03-31 ENCOUNTER — Other Ambulatory Visit: Payer: Self-pay | Admitting: Family Medicine

## 2023-04-02 ENCOUNTER — Telehealth: Payer: Self-pay | Admitting: Gastroenterology

## 2023-04-02 ENCOUNTER — Telehealth: Payer: Self-pay | Admitting: *Deleted

## 2023-04-02 ENCOUNTER — Inpatient Hospital Stay: Payer: Medicare Other | Attending: Oncology

## 2023-04-02 DIAGNOSIS — G8929 Other chronic pain: Secondary | ICD-10-CM | POA: Diagnosis not present

## 2023-04-02 DIAGNOSIS — C154 Malignant neoplasm of middle third of esophagus: Secondary | ICD-10-CM

## 2023-04-02 DIAGNOSIS — Z86711 Personal history of pulmonary embolism: Secondary | ICD-10-CM | POA: Insufficient documentation

## 2023-04-02 DIAGNOSIS — E119 Type 2 diabetes mellitus without complications: Secondary | ICD-10-CM | POA: Diagnosis not present

## 2023-04-02 DIAGNOSIS — G4733 Obstructive sleep apnea (adult) (pediatric): Secondary | ICD-10-CM | POA: Insufficient documentation

## 2023-04-02 DIAGNOSIS — C159 Malignant neoplasm of esophagus, unspecified: Secondary | ICD-10-CM | POA: Insufficient documentation

## 2023-04-02 DIAGNOSIS — I10 Essential (primary) hypertension: Secondary | ICD-10-CM | POA: Insufficient documentation

## 2023-04-02 DIAGNOSIS — K219 Gastro-esophageal reflux disease without esophagitis: Secondary | ICD-10-CM | POA: Insufficient documentation

## 2023-04-02 DIAGNOSIS — I771 Stricture of artery: Secondary | ICD-10-CM | POA: Diagnosis not present

## 2023-04-02 DIAGNOSIS — Z8542 Personal history of malignant neoplasm of other parts of uterus: Secondary | ICD-10-CM | POA: Insufficient documentation

## 2023-04-02 LAB — CMP (CANCER CENTER ONLY)
ALT: 20 U/L (ref 0–44)
AST: 12 U/L — ABNORMAL LOW (ref 15–41)
Albumin: 3.9 g/dL (ref 3.5–5.0)
Alkaline Phosphatase: 71 U/L (ref 38–126)
Anion gap: 6 (ref 5–15)
BUN: 24 mg/dL — ABNORMAL HIGH (ref 8–23)
CO2: 24 mmol/L (ref 22–32)
Calcium: 9.8 mg/dL (ref 8.9–10.3)
Chloride: 110 mmol/L (ref 98–111)
Creatinine: 0.99 mg/dL (ref 0.44–1.00)
GFR, Estimated: 53 mL/min — ABNORMAL LOW (ref >60)
Glucose, Bld: 119 mg/dL — ABNORMAL HIGH (ref 70–99)
Potassium: 4.4 mmol/L (ref 3.5–5.1)
Sodium: 140 mmol/L (ref 135–145)
Total Bilirubin: 0.3 mg/dL (ref 0.3–1.2)
Total Protein: 6.6 g/dL (ref 6.5–8.1)

## 2023-04-02 NOTE — Telephone Encounter (Addendum)
Daughter reports Sherry Holloway is having some abdominal bloating and reports her stool has become yellow over past 24 hours and general malaise. Daughter asking if she can have lab work today to assess her liver functions.  Reports PCP off today and GI has not responded to her request. Per Dr. Truett Perna: OK for CMP today. She will bring her here at 2 pm.

## 2023-04-02 NOTE — Telephone Encounter (Signed)
Inbound call from patient's daughter, stated she did not wish to make an appointment but would like for patient to come in and get labs drawn for her liver. Stated patient's PCP was out for today and would not be able to place orders. Please advise.

## 2023-04-02 NOTE — Telephone Encounter (Signed)
The pt daughter has been advised that the pt has not been seen in 2 years and would need to have an office visit to discuss any labs that may be needed.  She states she has a call out to Dr Truett Perna.  She will also follow up with PCP. I offered to make an appt and she declined at this time.

## 2023-04-03 ENCOUNTER — Telehealth: Payer: Self-pay

## 2023-04-03 NOTE — Telephone Encounter (Signed)
The patient's daughter demonstrated comprehension and did not raise any additional inquiries or issues at this moment.

## 2023-04-03 NOTE — Telephone Encounter (Signed)
-----   Message from Rana Snare, NP sent at 04/03/2023  4:19 PM EDT ----- Please let her know LFTs look good.

## 2023-04-04 ENCOUNTER — Telehealth: Payer: Self-pay

## 2023-04-04 NOTE — Telephone Encounter (Signed)
-----   Message from Rana Snare, NP sent at 04/04/2023  8:13 AM EDT ----- Please follow-up with her daughter-how is she feeling?

## 2023-04-04 NOTE — Telephone Encounter (Signed)
The patient's daughter has reported that the patient's abdominal bloating has subsided. Additionally, the patient's stool is normal in color, and their urine appears clear. Overall, the patient is in good condition and they are planning on engaging in a calming activity, such as picking flowers.

## 2023-04-15 ENCOUNTER — Encounter: Payer: Self-pay | Admitting: Oncology

## 2023-04-15 ENCOUNTER — Inpatient Hospital Stay (HOSPITAL_BASED_OUTPATIENT_CLINIC_OR_DEPARTMENT_OTHER): Payer: Medicare Other | Admitting: Oncology

## 2023-04-15 VITALS — BP 150/42 | HR 70 | Temp 98.2°F | Resp 18 | Ht 64.0 in | Wt 156.7 lb

## 2023-04-15 DIAGNOSIS — C154 Malignant neoplasm of middle third of esophagus: Secondary | ICD-10-CM | POA: Diagnosis not present

## 2023-04-15 DIAGNOSIS — C159 Malignant neoplasm of esophagus, unspecified: Secondary | ICD-10-CM | POA: Diagnosis not present

## 2023-04-15 NOTE — Progress Notes (Signed)
[facility] OFFICE PROGRESS NOTE   Diagnosis: Esophagus cancer, history of choledocholithiasis  INTERVAL HISTORY:   Sherry Holloway returns as scheduled.  He feels well.  No dysphagia.  No pain.  She developed "yellow "stool and abdominal bloating 2 weeks ago.  She contacted the office to request a lab visit.  A CMP revealed a normal liver panel.  The bloating has resolved.  Objective:  Vital signs in last 24 hours:  Blood pressure (!) 150/42, pulse 70, temperature 98.2 F (36.8 C), temperature source Oral, resp. rate 18, height 5\' 4"  (1.626 m), weight 156 lb 11.2 oz (71.1 kg), SpO2 99 %.    Lymphatics: No cervical, supraclavicular, axillary, or inguinal nodes Resp: Lungs clear bilaterally Cardio: Regular rate and rhythm GI: No mass, no hepatosplenomegaly, nontender Vascular: Trace lower leg edema bilaterally   Lab Results:  Lab Results  Component Value Date   WBC 7.7 10/02/2022   HGB 11.1 (L) 10/02/2022   HCT 34.1 (L) 10/02/2022   MCV 85.4 10/02/2022   PLT 210.0 10/02/2022   NEUTROABS 4.9 10/02/2022    CMP  Lab Results  Component Value Date   NA 140 04/02/2023   K 4.4 04/02/2023   CL 110 04/02/2023   CO2 24 04/02/2023   GLUCOSE 119 (H) 04/02/2023   BUN 24 (H) 04/02/2023   CREATININE 0.99 04/02/2023   CALCIUM 9.8 04/02/2023   PROT 6.6 04/02/2023   ALBUMIN 3.9 04/02/2023   AST 12 (L) 04/02/2023   ALT 20 04/02/2023   ALKPHOS 71 04/02/2023   BILITOT 0.3 04/02/2023   GFRNONAA 53 (L) 04/02/2023    Medications: I have reviewed the patient's current medications.   Assessment/Plan:  Esophagus cancer Upper EUS 03/05/2021-small submucosal ulcerating nodule with no bleeding and no stigmata of recent bleeding in the midesophagus, 29 to 31 cm from the incisors.  Lesion nonobstructing and not circumferential.  Patchy mildly erythematous mucosa without bleeding was found in the gastric body and gastric antrum.  The esophagus lesion was staged T1sm N0  MX.  Pancreatic parenchymal abnormalities consisting of atrophy were noted in the entire pancreas.  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  There was dilation of the common bile duct.  No malignant appearing lymph nodes were visualized in the middle paraesophageal mediastinum, lower paraesophageal mediastinum, celiac region, peripancreatic region and porta hepatis region.  Esophagus biopsy showed invasive carcinoma.  Stomach biopsy showed reactive gastropathy; no intestinal metaplasia, dysplasia or malignancy. PET 04/02/2021- low-level hypermetabolism associated with a 0.7 cm nodule at the mid thoracic esophagus, hypermetabolic cystic pancreas head mass with diffuse intrahepatic and extra fatigability duct dilatation compatible with malignant main duct IPMN, no evidence of metastatic disease CT chest and pelvis 03/16/2021-nonocclusive right lower lobe pulmonary embolus extending into segmental branches of the right lower lobe.  Nodular area at the left lung base with bandlike changes that extend from this area on the current study and on the prior study.  This may represent a combination of scarring and atelectasis.  Measures 1.4 x 1.2 cm, previously 2.1 x 1.9 cm.  Suggestion of slight interval decrease in size as well of a right lower lobe process with nodular features.  Persistent marked biliary duct distention. Radiation/Xeloda 07/26/2021-09/04/2021 Endoscopy at the time of ERCP 11/27/2021-no residual tumor seen CT scan of the abdomen on 01/25/2021 due to history of a  pancreatic mass.  The CT showed gross intra and extrahepatic biliary ductal dilatation, the central common bile duct appeared to contain a poorly calcified calculus or soft tissue nodule measuring approximately 1.9 cm.  There was diffuse atrophy of the pancreatic parenchyma and dilatation  of the main pancreatic duct.  There was a hypodense mass or gross pancreatic ductal dilatation within the pancreatic head measuring 3.5 x 2.0 x 2.0 cm.  Nodular appearing consolidation of the dependent left lung base measuring 2.4 x 1.8 cm, nonspecific. CT abdomen/pelvis 11/25/2021-3.4 cm cystic lesion in the pancreas head/uncinate, intrahepatic and extrapelvic ductal dilatation with evidence of choledocholithiasis in the distal common duct                               ERCP by Dr. Meridee Score on 03/05/2021.  The major papilla was located entirely within a pantaloon diverticulum.  Prior biliary sphincterotomy appeared open.  Pancreatic duct fishmouth deformity noted.  Filling defects consistent with stones and sludge were seen on the cholangiogram.  The entire main bile duct was severely dilated.  Choledocholithiasis was found.  Biliary sphincteroplasty performed.  Electrohydraulic lithotripsy of a remaining stone performed.   Pulmonary embolus on chest CT 03/15/2021-on Xarelto.  Bilateral lower extremity venous Doppler study with no significant femoral-popliteal DVT.  Limited assessment of the calf veins.  5.7 cm left popliteal fossa Baker's cyst. GERD Hypertension Diabetes OSA Subclavian arterial stenosis History of uterine cancer Chronic back pain maintained on MS Contin, followed by Dr. Ethelene Hal IPMN- diagnosed in July 2020 Biliary stones-status post ERCP/sphincterotomy 07/14/2019 for removal of stones Admission 11/25/2021 with recurrent choledocholithiasis, status post ERCP and stone removal 11/27/2021, bile duct stent placed 02/11/2022-ERCP with removal of biliary stent and removal of choledocholithiasis        Disposition: Ms. Segler is in clinical remission from esophagus cancer.  A chemistry panel 04/02/2023 was unremarkable.  No laboratory evidence for recurrence of the choledocholithiasis.  She will return for an office visit in 9 months.  She will call in the interim for new symptoms.   She will continue follow-up with Dr. Laury Axon.  Thornton Papas, MD  04/15/2023  3:21 PM

## 2023-04-16 ENCOUNTER — Other Ambulatory Visit: Payer: Self-pay | Admitting: Family Medicine

## 2023-05-01 ENCOUNTER — Other Ambulatory Visit: Payer: Self-pay | Admitting: Cardiology

## 2023-05-01 ENCOUNTER — Emergency Department (HOSPITAL_BASED_OUTPATIENT_CLINIC_OR_DEPARTMENT_OTHER): Payer: Medicare Other

## 2023-05-01 ENCOUNTER — Other Ambulatory Visit (INDEPENDENT_AMBULATORY_CARE_PROVIDER_SITE_OTHER): Payer: Medicare Other

## 2023-05-01 ENCOUNTER — Ambulatory Visit (INDEPENDENT_AMBULATORY_CARE_PROVIDER_SITE_OTHER): Payer: Medicare Other | Admitting: Family Medicine

## 2023-05-01 ENCOUNTER — Other Ambulatory Visit: Payer: Self-pay

## 2023-05-01 ENCOUNTER — Encounter (HOSPITAL_BASED_OUTPATIENT_CLINIC_OR_DEPARTMENT_OTHER): Payer: Self-pay | Admitting: Pediatrics

## 2023-05-01 ENCOUNTER — Emergency Department (HOSPITAL_BASED_OUTPATIENT_CLINIC_OR_DEPARTMENT_OTHER)
Admission: EM | Admit: 2023-05-01 | Discharge: 2023-05-01 | Disposition: A | Discharge status: Home / Self Care | Payer: Medicare Other | Attending: Emergency Medicine | Admitting: Emergency Medicine

## 2023-05-01 VITALS — BP 150/60 | HR 34 | Temp 98.4°F | Resp 18 | Ht 64.0 in | Wt 155.6 lb

## 2023-05-01 DIAGNOSIS — Z8501 Personal history of malignant neoplasm of esophagus: Secondary | ICD-10-CM | POA: Insufficient documentation

## 2023-05-01 DIAGNOSIS — R001 Bradycardia, unspecified: Secondary | ICD-10-CM | POA: Diagnosis present

## 2023-05-01 DIAGNOSIS — I493 Ventricular premature depolarization: Secondary | ICD-10-CM

## 2023-05-01 DIAGNOSIS — I10 Essential (primary) hypertension: Secondary | ICD-10-CM | POA: Diagnosis not present

## 2023-05-01 DIAGNOSIS — Z8541 Personal history of malignant neoplasm of cervix uteri: Secondary | ICD-10-CM | POA: Insufficient documentation

## 2023-05-01 DIAGNOSIS — Z7984 Long term (current) use of oral hypoglycemic drugs: Secondary | ICD-10-CM | POA: Diagnosis not present

## 2023-05-01 DIAGNOSIS — I452 Bifascicular block: Secondary | ICD-10-CM

## 2023-05-01 DIAGNOSIS — R42 Dizziness and giddiness: Secondary | ICD-10-CM | POA: Diagnosis not present

## 2023-05-01 DIAGNOSIS — E119 Type 2 diabetes mellitus without complications: Secondary | ICD-10-CM | POA: Insufficient documentation

## 2023-05-01 DIAGNOSIS — Z7901 Long term (current) use of anticoagulants: Secondary | ICD-10-CM | POA: Insufficient documentation

## 2023-05-01 DIAGNOSIS — I498 Other specified cardiac arrhythmias: Secondary | ICD-10-CM

## 2023-05-01 DIAGNOSIS — Z79899 Other long term (current) drug therapy: Secondary | ICD-10-CM | POA: Diagnosis not present

## 2023-05-01 LAB — CBC WITH DIFFERENTIAL/PLATELET
Abs Immature Granulocytes: 0.02 10*3/uL (ref 0.00–0.07)
Basophils Absolute: 0 10*3/uL (ref 0.0–0.1)
Basophils Relative: 1 %
Eosinophils Absolute: 0.1 10*3/uL (ref 0.0–0.5)
Eosinophils Relative: 2 %
HCT: 37.7 % (ref 36.0–46.0)
Hemoglobin: 12.1 g/dL (ref 12.0–15.0)
Immature Granulocytes: 0 %
Lymphocytes Relative: 29 %
Lymphs Abs: 2 10*3/uL (ref 0.7–4.0)
MCH: 28.5 pg (ref 26.0–34.0)
MCHC: 32.1 g/dL (ref 30.0–36.0)
MCV: 88.9 fL (ref 80.0–100.0)
Monocytes Absolute: 0.5 10*3/uL (ref 0.1–1.0)
Monocytes Relative: 7 %
Neutro Abs: 4.3 10*3/uL (ref 1.7–7.7)
Neutrophils Relative %: 61 %
Platelets: 169 10*3/uL (ref 150–400)
RBC: 4.24 MIL/uL (ref 3.87–5.11)
RDW: 14.2 % (ref 11.5–15.5)
WBC: 6.9 10*3/uL (ref 4.0–10.5)
nRBC: 0 % (ref 0.0–0.2)

## 2023-05-01 LAB — TROPONIN I (HIGH SENSITIVITY): Troponin I (High Sensitivity): 9 ng/L (ref <18)

## 2023-05-01 LAB — MAGNESIUM: Magnesium: 2.1 mg/dL (ref 1.7–2.4)

## 2023-05-01 LAB — BASIC METABOLIC PANEL
Anion gap: 8 (ref 5–15)
BUN: 21 mg/dL (ref 8–23)
CO2: 24 mmol/L (ref 22–32)
Calcium: 9 mg/dL (ref 8.9–10.3)
Chloride: 105 mmol/L (ref 98–111)
Creatinine, Ser: 0.95 mg/dL (ref 0.44–1.00)
GFR, Estimated: 56 mL/min — ABNORMAL LOW (ref >60)
Glucose, Bld: 105 mg/dL — ABNORMAL HIGH (ref 70–99)
Potassium: 3.5 mmol/L (ref 3.5–5.1)
Sodium: 137 mmol/L (ref 135–145)

## 2023-05-01 NOTE — ED Triage Notes (Signed)
Reported was seen at PCP for ongoing dizziness x 3 weeks and had an abnormal EKG.

## 2023-05-01 NOTE — ED Notes (Signed)
Reviewed discharge instructions and recommendations with pt and pts daughter. States understanding. Pt ambulatory with rollator and daughter at this time

## 2023-05-01 NOTE — ED Provider Notes (Signed)
[place] [facility] Provider Note   CSN: [id number] Arrival date & time: 05/01/23  1412     History  Chief Complaint  Patient presents with   Abnormal ECG    Sherry Holloway is a 89 y.o. female.  Sherry Holloway is a 89 y.o. female with a history of hypertension, diabetes, hyperlipidemia, sleep apnea, esophageal cancer and uterine cancer who presents to the emergency department from PCP office upstairs for evaluation of bradycardia.  Patient reports that she is seeing her primary care provider for a routine follow-up visit.  During the visit reported some intermittent dizziness going on for the past 3 weeks.  She describes these as blurry episodes lasting a few seconds of dizziness and has not noted a specific pattern or trigger for them.  In the office patient was noted to be bradycardic and have an abnormal EKG concerning for ventricular bigeminy and was sent down to the ED for further evaluation.  Patient's daughter is at bedside who works in the [employer] at [facility].  Patient denies history of cardiac issues no known history of other arrhythmias.  She has not had any syncopal episodes.  Denies chest pain or palpitations, no shortness of breath.  No headache.  No other aggravating or alleviating factors.  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  The history is provided by the patient, a relative and medical records.       Home Medications Prior to Admission medications   Medication Sig Start Date End Date Taking? Authorizing Provider  acetaminophen (TYLENOL) 325 MG tablet Take 352-650 mg by mouth every 6 (six) hours as needed for mild pain or moderate pain.    [provider]  bumetanide (BUMEX) 1 MG tablet Take 0.5-1 tablets (0.5-1 mg total) by mouth daily as needed (swelling). 08/06/22   Donato Schultz, DO  docusate sodium (COLACE) 100 MG capsule Take 1 capsule (100 mg total) by mouth 2 (two) times daily.  03/18/21   Osvaldo Shipper, MD  famciclovir [facility]) 125 MG tablet TAKE 1 TABLET DAILY 03/31/23   Zola Button, Grayling Congress, DO  glimepiride (AMARYL) 4 MG tablet Take 1 tablet (4 mg total) by mouth 2 (two) times daily. 08/06/22   Seabron Spates R, DO  glucose blood (FREESTYLE LITE) test strip CHECK BLOOD SUGAR TWICE A DAY 11/20/22   Zola Button, Grayling Congress, DO  JARDIANCE 25 MG TABS tablet TAKE 1 TABLET DAILY BEFORE BREAKFAST 02/04/23   Zola Button, Grayling Congress, DO  latanoprost (XALATAN) 0.005 % ophthalmic solution Place 1 drop into both eyes at bedtime. 08/24/21   Donato Schultz, DO  linaclotide [facility]) 145 MCG CAPS capsule TAKE 1 CAPSULE DAILY BEFORE BREAKFAST 01/20/23   Zola Button, Grayling Congress, DO  lipase/protease/amylase (CREON) 36000 UNITS CPEP capsule Take 1 capsule (36,000 Units total) by mouth 3 (three) times daily before meals. 08/06/22   Seabron Spates R, DO  mirtazapine (REMERON) 15 MG tablet TAKE 1 TABLET AT BEDTIME 04/16/23   Zola Button, Grayling Congress, DO  morphine (MS CONTIN) 30 MG 12 hr tablet Take 30 mg by mouth every 12 (twelve) hours. 01/21/22   [provider]  Multiple Vitamin (MULTIVITAMIN WITH MINERALS) TABS tablet Take 1 tablet by mouth daily with supper.    [provider]  omeprazole (PRILOSEC) 20 MG capsule Take 1 capsule (20 mg total) by mouth 2 (two) times daily before a meal. 08/06/22  Zola Button, Yvonne R, DO  senna (SENOKOT) 8.6 MG TABS tablet Take 1 tablet (8.6 mg total) by mouth daily. Patient taking differently: Take 2 tablets by mouth in the morning. 11/29/21   Regalado, Belkys A, MD  valsartan (DIOVAN) 40 MG tablet TAKE 1 TABLET DAILY WITH BREAKFAST 01/20/23   Lowne [name], Yvonne R, DO  XARELTO 20 MG TABS tablet TAKE 1 TABLET DAILY WITH SUPPER, START ONLY AFTER THE STARTER PACK HAS BEEN COMPLETED 10/28/22   Donato Schultz, DO      Allergies    Gramicidin, Sulfa antibiotics, and Wound dressing adhesive    Review of Systems   Review of Systems   Constitutional:  Negative for chills and fever.  Respiratory:  Negative for shortness of breath.   Cardiovascular:  Negative for chest pain, palpitations and leg swelling.  Gastrointestinal:  Negative for abdominal pain and nausea.  Neurological:  Positive for dizziness. Negative for syncope, weakness, numbness and headaches.  All other systems reviewed and are negative.   Physical Exam Updated Vital Signs BP (!) 140/42 (BP Location: Right Arm)   Pulse (!) 34   Temp 98.7 F (37.1 C) (Oral)   Resp 18   Ht 5\' 4"  (1.626 m)   Wt 70.6 kg   SpO2 97%   BMI 26.71 kg/m  Physical Exam Vitals and nursing note reviewed.  Constitutional:      General: She is not in acute distress.    Appearance: Normal appearance. She is well-developed. She is not ill-appearing or diaphoretic.  HENT:     Head: Normocephalic and atraumatic.     Mouth/Throat:     Mouth: Mucous membranes are moist.     Pharynx: Oropharynx is clear.  Eyes:     General:        Right eye: No discharge.        Left eye: No discharge.  Cardiovascular:     Rate and Rhythm: Bradycardia present. Rhythm irregular.     Pulses: Normal pulses.     Heart sounds: Normal heart sounds. No murmur heard.    No friction rub. No gallop.     Comments: Bradycardia noted with irregular heart rate, on review of cardiac monitoring in room patient seems to be flipping in and out of ventricular bigeminy.  No symptoms of recurrent episodes. Pulmonary:     Effort: Pulmonary effort is normal. No respiratory distress.     Breath sounds: Normal breath sounds. No wheezing, rhonchi or rales.  Abdominal:     General: Bowel sounds are normal.     Palpations: Abdomen is soft.     Tenderness: There is no abdominal tenderness.  Musculoskeletal:     Right lower leg: No edema.     Left lower leg: No edema.  Skin:    General: Skin is warm and dry.  Neurological:     Mental Status: She is alert and oriented to person, place, and time.     Coordination:  Coordination normal.  Psychiatric:        Mood and Affect: Mood normal.        Behavior: Behavior normal.     ED Results / Procedures / Treatments   Labs (all labs ordered are listed, but only abnormal results are displayed) Labs Reviewed  BASIC METABOLIC PANEL - Abnormal; Notable for the following components:      Result Value   Glucose, Bld 105 (*)    GFR, Estimated 56 (*)    All other components within  normal limits  CBC WITH DIFFERENTIAL/PLATELET  MAGNESIUM  TROPONIN I (HIGH SENSITIVITY)    EKG EKG Interpretation  Date/Time:  Thursday May 01 2023 14:41:11 EDT Ventricular Rate:  68 PR Interval:  176 QRS Duration: 140 QT Interval:  442 QTC Calculation: 471 R Axis:   -44 Text Interpretation: Sinus rhythm RBBB and LAFB Left ventricular hypertrophy when compared to prior,  bigeminy resolved. NO STEMI Confirmed by Theda Belfast ([id number]) on 05/01/2023 3:02:17 PM  Radiology  DG Chest Port 1 View  Result Date: 05/01/2023 CLINICAL DATA:  Bradycardia, dizziness EXAM: PORTABLE CHEST 1 VIEW COMPARISON:  11/28/2021 FINDINGS: Redemonstrated spinal stimulator leads. Unchanged cardiac and mediastinal contours. Aortic atherosclerosis. No focal pulmonary opacity. No pleural effusion or pneumothorax. No acute osseous abnormality. IMPRESSION: No active disease. Electronically Signed   By: Wiliam Ke M.D.   On: 05/01/2023 15:13     Procedures Procedures    Medications Ordered in ED Medications - No data to display  ED Course/ Medical Decision Making/ A&P Clinical Course as of 05/01/23 1623  Thu May 01, 2023  1505 Stable Dizziness for 3 weeks. EKG with frequent PVC  [CC]  1608 Cardiology consulted.  They recommended outpatient care and management.  No focal pathology identified patient well-appearing no acute distress. [CC]  [id number] Personally evaluated patient at bedside.  They have already scheduled follow-up for Monday.  They understand return precautions including syncope  shortness of breath weakness or any new symptoms.  They feel comfortable outpatient care management especially given her anticoagulation use and need for anticoagulation washout prior to any procedural intervention. [CC]    Clinical Course User Index [CC] Glyn Ade, MD                             Medical Decision Making Amount and/or Complexity of Data Reviewed Labs: ordered. Radiology: ordered.  89 yo female presents from PCP office for evaluation of bradycardia and abnormal EKG, brief episodes of intermittent dizziness over the past 3 weeks, no syncope, no chest pain or shortness of breath no palpitations.  Noted to be going in and out of ventricular bigeminy with effective bradycardia during these episodes with rates 70s to the 30s, then will flip back into normal sinus rhythm.  Did not seem to be having dizziness while having these episodes of bradycardia here in the office.  Patient is not on any rate modifying medications that may contribute to bradycardia.  Differential includes ischemic heart disease affecting electrical conduction system, electrolyte derangement, renal dysfunction, age-related conduction changes.  EKGs reviewed as well as continuous cardiac monitoring, no ischemic changes noted, patient also noted to have some conduction abnormalities with a right bundle branch block and left anterior fascicular block noted as well as some LVH.  Additional history obtained from daughter at bedside. Previous records obtained and reviewed    Lab Tests:  I Ordered, reviewed, and interpreted labs, which included: CBC without leukocytosis or anemia, BMP without any electrolyte derangements, normal renal function, normal magnesium and troponin.  Imaging Studies ordered:  I ordered imaging studies which included chest x-ray, I independently visualized and interpreted imaging which showed no active cardiopulmonary disease.  ED Course:   Throughout ED stay patient continues to flip  in and out of ventricular bigeminy but seems to remain asymptomatic, no hypotension noted.  Case discussed with Dr. Royann Shivers who reviewed patient's EKG and records.  Given that she is asymptomatic and episodes of dizziness that are prescribed  by the patient may not be directly linked he recommends close outpatient follow-up and will order a Zio patch monitor for for the patient.  Even if a pacemaker was ultimately recommended patient have anticoagulant washout this would not be able to be done for a few days.  he called discussed this plan with patient and daughter via phone as well and they are in agreement.  she has been set up for outpatient follow-up on monday morning.    Portions of this note were generated with Scientist, clinical (histocompatibility and immunogenetics). Dictation errors may occur despite best attempts at proofreading.         Final Clinical Impression(s) / ED Diagnoses Final diagnoses:  Bradycardia    Rx / DC Orders ED Discharge Orders     None         Legrand Rams 05/18/23 1355    Glyn Ade, MD 05/18/23 1458

## 2023-05-01 NOTE — Progress Notes (Signed)
The patient reports several weeks of brief dizziness that occurs abruptly, a few times a day. The ECG in PCP office shows ventricular bigeminy, quite possibly causing effective bradycardia. 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 She is not on any cardiac medications, other than a very low dose of Valsartan. No significant structural heart abnormlaities by the echo in 2022. Recommend an outpatient arrhythmia monitor and follow up in clinic next week. Will refrain from empirical suppression of PVCs with beta blockers until we clarify that she is not having episodes of higher grade AV block. Discussed with ED staff and Alfonso Ramus, her daughter, who is also a nurse in our cath lab holding area.

## 2023-05-01 NOTE — Discharge Instructions (Signed)
You were seen today for low heart rate in clinic.  It appears that you are having frequent PVCs causing a relatively decreased ventricular rate. This does not appear to be from an electrolyte abnormality nor any other critical pathology. You will need to follow-up with cardiology as scheduled on Monday for outpatient care and management. You may require referral to electrophysiology for pacemaker placement for definitive management of this condition. If you have any worsening symptomatic changes this weekend including lightheadedness syncope, chest pain or shortness of breath you will need to emergently return to the emergency department for further care and management. Thank you for the opportunity to participate in your care, Glyn Ade MD

## 2023-05-01 NOTE — Progress Notes (Signed)
Subjective:   By signing my name below, I, [name], attest that this documentation has been prepared under the direction and in the presence of Donato Schultz, DO. 05/01/2023   Patient ID: Sherry Holloway, female    DOB: [birth year], 89 y.o.   MRN: [id number]  Chief Complaint  Patient presents with   Diabetes   Hypertension   Follow-up    Diabetes Hypoglycemia symptoms include dizziness (brief episodes).  Hypertension   Patient is in today for a follow up visit.   Her daughter reports patient is experiencing brief episodes of dizzy spells. Her pulse is low during this visit.  Pulse Readings from Last 3 Encounters:  05/01/23 (!) 59  05/01/23 (!) 34  04/15/23 70   She also reports increased leg swelling. She is taking 0.5 mg bumex as needed to manage her symptoms.   Past Medical History:  Diagnosis Date   Arthritis    Choledocholithiasis    Diabetes mellitus without complication (HCC)    Diverticulitis    Esophageal cancer (HCC) dx'd 2020   GERD (gastroesophageal reflux disease)    Hyperlipidemia    Hypertension    OSA (obstructive sleep apnea)    Urine incontinence    Uterine cancer [facility])     Past Surgical History:  Procedure Laterality Date   ABDOMINAL HYSTERECTOMY  1972   APPENDECTOMY     BILIARY DILATION  03/05/2021   Procedure: BILIARY DILATION;  Surgeon: Lemar Lofty., MD;  Location: [facility] ENDOSCOPY;  Service: Gastroenterology;;   BILIARY DILATION  11/27/2021   Procedure: BILIARY DILATION;  Surgeon: Rachael Fee, MD;  Location: [facility] ENDOSCOPY;  Service: Endoscopy;;   BILIARY DILATION  02/11/2022   Procedure: BILIARY DILATION;  Surgeon: Lemar Lofty., MD;  Location: [facility] ENDOSCOPY;  Service: Gastroenterology;;   BILIARY STENT PLACEMENT  11/27/2021   Procedure: BILIARY STENT PLACEMENT;  Surgeon: Rachael Fee, MD;  Location: [facility] ENDOSCOPY;  Service: Endoscopy;;   BIOPSY  03/05/2021   Procedure: BIOPSY;  Surgeon: Lemar Lofty., MD;  Location: [facility] ENDOSCOPY;  Service: Gastroenterology;;   BIOPSY  11/27/2021   Procedure: BIOPSY;  Surgeon: Rachael Fee, MD;  Location: [facility] ENDOSCOPY;  Service: Endoscopy;;   CHOLECYSTECTOMY     ENDOSCOPIC RETROGRADE CHOLANGIOPANCREATOGRAPHY (ERCP) WITH PROPOFOL N/A 03/05/2021   Procedure: ENDOSCOPIC RETROGRADE CHOLANGIOPANCREATOGRAPHY (ERCP) WITH PROPOFOL;  Surgeon: Lemar Lofty., MD;  Location: [facility] ENDOSCOPY;  Service: Gastroenterology;  Laterality: N/A;   ENDOSCOPIC RETROGRADE CHOLANGIOPANCREATOGRAPHY (ERCP) WITH PROPOFOL N/A 11/27/2021   Procedure: ENDOSCOPIC RETROGRADE CHOLANGIOPANCREATOGRAPHY (ERCP) WITH PROPOFOL;  Surgeon: Rachael Fee, MD;  Location: [facility] ENDOSCOPY;  Service: Endoscopy;  Laterality: N/A;  possible EGD prior   ENDOSCOPIC RETROGRADE CHOLANGIOPANCREATOGRAPHY (ERCP) WITH PROPOFOL N/A 02/11/2022   Procedure: ENDOSCOPIC RETROGRADE CHOLANGIOPANCREATOGRAPHY (ERCP) WITH PROPOFOL;  Surgeon: Meridee Score Netty Starring., MD;  Location: [facility] ENDOSCOPY;  Service: Gastroenterology;  Laterality: N/A;   ESOPHAGOGASTRODUODENOSCOPY N/A 11/27/2021   Procedure: ESOPHAGOGASTRODUODENOSCOPY (EGD);  Surgeon: Rachael Fee, MD;  Location: [facility] ENDOSCOPY;  Service: Endoscopy;  Laterality: N/A;   ESOPHAGOGASTRODUODENOSCOPY (EGD) WITH PROPOFOL N/A 03/05/2021   Procedure: ESOPHAGOGASTRODUODENOSCOPY (EGD) WITH PROPOFOL;  Surgeon: Meridee Score Netty Starring., MD;  Location: [facility] ENDOSCOPY;  Service: Gastroenterology;  Laterality: N/A;   EUS N/A 03/05/2021   Procedure: UPPER ENDOSCOPIC ULTRASOUND (EUS) RADIAL;  Surgeon: Lemar Lofty., MD;  Location: [facility] ENDOSCOPY;  Service: Gastroenterology;  Laterality: N/A;   LITHOTRIPSY  02/11/2022   Procedure: LITHOTRIPSY;  Surgeon: Meridee Score Netty Starring., MD;  Location: [facility] ENDOSCOPY;  Service: Gastroenterology;;   LUMBAR LAMINECTOMY     REMOVAL OF STONES  03/05/2021   Procedure: REMOVAL OF STONES;  Surgeon: Meridee Score Netty Starring., MD;   Location: [facility] ENDOSCOPY;  Service: Gastroenterology;;   REMOVAL OF STONES  11/27/2021   Procedure: REMOVAL OF STONES;  Surgeon: Rachael Fee, MD;  Location: [facility] ENDOSCOPY;  Service: Endoscopy;;   REMOVAL OF STONES  02/11/2022   Procedure: REMOVAL OF STONES;  Surgeon: Lemar Lofty., MD;  Location: [facility] ENDOSCOPY;  Service: Gastroenterology;;   SPINAL CORD STIMULATOR IMPLANT     SPYGLASS CHOLANGIOSCOPY N/A 03/05/2021   Procedure: ZOXWRUEA CHOLANGIOSCOPY;  Surgeon: Lemar Lofty., MD;  Location: [facility] ENDOSCOPY;  Service: Gastroenterology;  Laterality: N/A;   SPYGLASS CHOLANGIOSCOPY N/A 02/11/2022   Procedure: SPYGLASS CHOLANGIOSCOPY;  Surgeon: Lemar Lofty., MD;  [facility] ENDOSCOPY;  Service: Gastroenterology;  Laterality: N/A;   SPYGLASS LITHOTRIPSY N/A 03/05/2021   Procedure: [id number] LITHOTRIPSY;  Surgeon: Lemar Lofty., MD;  Location: [facility] ENDOSCOPY;  Service: Gastroenterology;  Laterality: N/A;   SPYGLASS LITHOTRIPSY N/A 02/11/2022   Procedure: JYNWGNFA LITHOTRIPSY;  Surgeon: Lemar Lofty., MD;  Location: [facility] ENDOSCOPY;  Service: Gastroenterology;  Laterality: N/A;   STENT REMOVAL  02/11/2022   Procedure: STENT REMOVAL;  Surgeon: Meridee Score Netty Starring., MD;  Location: [facility] ENDOSCOPY;  Service: Gastroenterology;;   TONSILLECTOMY     TOTAL KNEE ARTHROPLASTY Right     Family History  Problem Relation Age of Onset   Diabetes Mother    Heart disease Father    Lung cancer Sister    Lung cancer Son    Hypertension Son    Hypertension Daughter    Diabetes Son    Hypertension Son    Diabetes Daughter    Hypertension Daughter     Social History   Socioeconomic History   Marital status: Widowed    Spouse name: Not on file   Number of children: 4   Years of education: Not on file   Highest education level: Not on file  Occupational History   Occupation: Retired Lawyer  Tobacco Use   Smoking status: Former    Packs/day: 1.00    Years: 35.00     Additional pack years: 0.00    Total pack years: 35.00    Types: Cigarettes    Quit date: 01/17/1989    Years since quitting: 34.3   Smokeless tobacco: Never  Vaping Use   Vaping Use: Never used  Substance and Sexual Activity   Alcohol use: Not Currently   Drug use: Never   Sexual activity: Not Currently  Other Topics Concern   Not on file  Social History Narrative   Just moved from [place]--- and moved into an apt 2.4 miles from her daughter    Social Determinants of Health   Financial Resource Strain: Low Risk  (04/02/2022)   Overall Financial Resource Strain (CARDIA)    Difficulty of Paying Living Expenses: Not hard at all  Food Insecurity: No Food Insecurity (04/02/2022)   Hunger Vital Sign    Worried About Running Out of Food in the Last Year: Never true    Ran Out of Food in the Last Year: Never true  Transportation Needs: No Transportation Needs (04/02/2022)   PRAPARE - Administrator, Civil Service (Medical): No    Lack of Transportation (Non-Medical): No  Physical Activity: Sufficiently Active (04/02/2022)   Exercise Vital Sign    Days of Exercise per Week: 5 days    Minutes of  Exercise per Session: 60 min  Stress: No Stress Concern Present (04/02/2022)   [employer] of Occupational Health - Occupational Stress Questionnaire    Feeling of Stress : Not at all  Social Connections: Moderately Isolated (04/02/2022)   Social Connection and Isolation Panel [NHANES]    Frequency of Communication with Friends and Family: More than three times a week    Frequency of Social Gatherings with Friends and Family: Three times a week    Attends Religious Services: More than 4 times per year    Active Member of Clubs or Organizations: No    Attends Banker Meetings: Never    Marital Status: Widowed  Intimate Partner Violence: Not At Risk (07/17/2021)   Humiliation, Afraid, Rape, and Kick questionnaire    Fear of Current or Ex-Partner: No    Emotionally Abused:  No    Physically Abused: No    Sexually Abused: No    Outpatient Medications Prior to Visit  Medication Sig Dispense Refill   acetaminophen (TYLENOL) 325 MG tablet Take 352-650 mg by mouth every 6 (six) hours as needed for mild pain or moderate pain.     bumetanide (BUMEX) 1 MG tablet Take 0.5-1 tablets (0.5-1 mg total) by mouth daily as needed (swelling). 90 tablet 1   docusate sodium (COLACE) 100 MG capsule Take 1 capsule (100 mg total) by mouth 2 (two) times daily. 10 capsule 0   famciclovir (FAMVIR) 125 MG tablet TAKE 1 TABLET DAILY 90 tablet 3   glimepiride (AMARYL) 4 MG tablet Take 1 tablet (4 mg total) by mouth 2 (two) times daily. 180 tablet 1   glucose blood (FREESTYLE LITE) test strip CHECK BLOOD SUGAR TWICE A DAY 200 strip 12   JARDIANCE 25 MG TABS tablet TAKE 1 TABLET DAILY BEFORE BREAKFAST 90 tablet 3   latanoprost (XALATAN) 0.005 % ophthalmic solution Place 1 drop into both eyes at bedtime. 2.5 mL 2   linaclotide (LINZESS) 145 MCG CAPS capsule TAKE 1 CAPSULE DAILY BEFORE BREAKFAST 90 capsule 1   lipase/protease/amylase (CREON) 36000 UNITS CPEP capsule Take 1 capsule (36,000 Units total) by mouth 3 (three) times daily before meals. 270 capsule 1   mirtazapine (REMERON) 15 MG tablet TAKE 1 TABLET AT BEDTIME 90 tablet 3   morphine (MS CONTIN) 30 MG 12 hr tablet Take 30 mg by mouth every 12 (twelve) hours.     Multiple Vitamin (MULTIVITAMIN WITH MINERALS) TABS tablet Take 1 tablet by mouth daily with supper.     omeprazole (PRILOSEC) 20 MG capsule Take 1 capsule (20 mg total) by mouth 2 (two) times daily before a meal. 180 capsule 1   senna (SENOKOT) 8.6 MG TABS tablet Take 1 tablet (8.6 mg total) by mouth daily. (Patient taking differently: Take 2 tablets by mouth in the morning.) 120 tablet 0   valsartan (DIOVAN) 40 MG tablet TAKE 1 TABLET DAILY WITH BREAKFAST 90 tablet 1   XARELTO 20 MG TABS tablet TAKE 1 TABLET DAILY WITH SUPPER, START ONLY AFTER THE STARTER PACK HAS BEEN  COMPLETED 90 tablet 3   No facility-administered medications prior to visit.    Allergies  Allergen Reactions   Gramicidin Other (See Comments)    Unknown reaction type   Sulfa Antibiotics Rash   Wound Dressing Adhesive Rash    Review of Systems  Cardiovascular:  Positive for leg swelling.  Neurological:  Positive for dizziness (brief episodes).       Objective:    Physical Exam Constitutional:  General: She is not in acute distress.    Appearance: Normal appearance. She is not ill-appearing.  HENT:     Head: Normocephalic and atraumatic.     Right Ear: External ear normal.     Left Ear: External ear normal.  Eyes:     Extraocular Movements: Extraocular movements intact.     Pupils: Pupils are equal, round, and reactive to light.  Cardiovascular:     Rate and Rhythm: Normal rate. Rhythm irregular.     Heart sounds: Normal heart sounds. No murmur heard.    No gallop.     Comments: Pauses in heart rate noted Pulmonary:     Effort: Pulmonary effort is normal. No respiratory distress.     Breath sounds: Normal breath sounds. No wheezing or rales.  Skin:    General: Skin is warm and dry.  Neurological:     Mental Status: She is alert and oriented to person, place, and time.  Psychiatric:        Judgment: Judgment normal.     BP (!) 150/60 (BP Location: Right Arm, Patient Position: Sitting, Cuff Size: Normal)   Pulse (!) 34   Temp 98.4 F (36.9 C) (Oral)   Resp 18   Ht 5\' 4"  (1.626 m)   Wt 155 lb 9.6 oz (70.6 kg)   SpO2 94%   BMI 26.71 kg/m  Wt Readings from Last 3 Encounters:  05/01/23 155 lb 9.6 oz (70.6 kg)  05/01/23 155 lb 9.6 oz (70.6 kg)  04/15/23 156 lb 11.2 oz (71.1 kg)       Assessment & Plan:  Dizziness Assessment & Plan: D/w er  Due to symptomatic bradycardia---pt sent to er    Orders: -     EKG 12-Lead -     Ambulatory referral to Cardiology  Bradycardia Assessment & Plan: EKG ---  bigeminy Pt sent to er due to symptomatic  bradycardia    Orders: -     EKG 12-Lead -     Ambulatory referral to Cardiology    I, Donato Schultz, DO, personally preformed the services described in this documentation.  All medical record entries made by the scribe were at my direction and in my presence.  I have reviewed the chart and discharge instructions (if applicable) and agree that the record reflects my personal performance and is accurate and complete. 05/01/2023   I,[name],acting as a scribe for Donato Schultz, DO.,have documented all relevant documentation on the behalf of Donato Schultz, DO,as directed by  Donato Schultz, DO while in the presence of Donato Schultz, DO.   Donato Schultz, DO

## 2023-05-01 NOTE — Progress Notes (Unsigned)
Enrolled for [employer] to mail a ZIO AT Live Telemetry monitor to patients address on file.   Dr. Croitoru to read. 

## 2023-05-02 ENCOUNTER — Encounter: Payer: Self-pay | Admitting: Family Medicine

## 2023-05-02 DIAGNOSIS — R001 Bradycardia, unspecified: Secondary | ICD-10-CM | POA: Insufficient documentation

## 2023-05-02 DIAGNOSIS — R42 Dizziness and giddiness: Secondary | ICD-10-CM | POA: Insufficient documentation

## 2023-05-02 NOTE — Assessment & Plan Note (Signed)
EKG ---  bigeminy Pt sent to er due to symptomatic bradycardia

## 2023-05-02 NOTE — Assessment & Plan Note (Signed)
D/w er  Due to symptomatic bradycardia---pt sent to er

## 2023-05-04 NOTE — Progress Notes (Unsigned)
Cardiology Office Note   Date:  05/05/2023   ID:  Sherry Holloway, DOB October 25, 1928, MRN [id number]  PCP:  Sherry Holloway, Sherry Holloway, Sherry Holloway    No chief complaint on file.  bradycardia  Wt Readings from Last 3 Encounters:  05/05/23 151 lb 9.6 oz (68.8 kg)  05/01/23 155 lb 9.6 oz (70.6 kg)  05/01/23 155 lb 9.6 oz (70.6 kg)       History of Present Illness: Sherry Holloway is a 89 y.o. female who is being seen today for the evaluation of bradycardia at the request of Sherry Holloway, *.   Mother of Sherry Holloway.    Went to ER in 5/24: "The patient reports several weeks of brief dizziness that occurs abruptly, a few times a day. The ECG in PCP office shows ventricular bigeminy, quite possibly causing effective bradycardia. 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 She is not on any cardiac medications, other than a very low dose of Valsartan. No significant structural heart abnormlaities by the echo in 2022. Recommend an outpatient arrhythmia monitor and follow up in clinic next week. Will refrain from empirical suppression of PVCs with beta blockers until we clarify that she is not having episodes of higher grade AV block."  Since the ER vsit, no further dizziness.   Prior episodes would last just a few seconds.  Monitor not started as of yet.    Past Medical History:  Diagnosis Date   Arthritis    Choledocholithiasis    Diabetes mellitus without complication (HCC)    Diverticulitis    Esophageal cancer (HCC) dx'd 2020   GERD (gastroesophageal reflux disease)    Hyperlipidemia    Hypertension    OSA (obstructive sleep apnea)    Urine incontinence    Uterine cancer [facility])     Past Surgical History:  Procedure Laterality Date   ABDOMINAL HYSTERECTOMY  1972   APPENDECTOMY     BILIARY DILATION  03/05/2021   Procedure: BILIARY DILATION;  Surgeon: Lemar Lofty., MD;  Location: [facility] ENDOSCOPY;  Service:  Gastroenterology;;   BILIARY DILATION  11/27/2021   Procedure: BILIARY DILATION;  Surgeon: Rachael Fee, MD;  Location: [facility] ENDOSCOPY;  Service: Endoscopy;;   BILIARY DILATION  02/11/2022   Procedure: BILIARY DILATION;  Surgeon: Lemar Lofty., MD;  Location: [facility] ENDOSCOPY;  Service: Gastroenterology;;   BILIARY STENT PLACEMENT  11/27/2021   Procedure: BILIARY STENT PLACEMENT;  Surgeon: Rachael Fee, MD;  Location: [facility] ENDOSCOPY;  Service: Endoscopy;;   BIOPSY  03/05/2021   Procedure: BIOPSY;  Surgeon: Lemar Lofty., MD;  Location: [facility] ENDOSCOPY;  Service: Gastroenterology;;   BIOPSY  11/27/2021   Procedure: BIOPSY;  Surgeon: Rachael Fee, MD;  Location: [facility] ENDOSCOPY;  Service: Endoscopy;;   CHOLECYSTECTOMY     ENDOSCOPIC RETROGRADE CHOLANGIOPANCREATOGRAPHY (ERCP) WITH PROPOFOL N/A 03/05/2021   Procedure: ENDOSCOPIC RETROGRADE CHOLANGIOPANCREATOGRAPHY (ERCP) WITH PROPOFOL;  Surgeon: Lemar Lofty., MD;  Location: [facility] ENDOSCOPY;  Service: Gastroenterology;  Laterality: N/A;   ENDOSCOPIC RETROGRADE CHOLANGIOPANCREATOGRAPHY (ERCP) WITH PROPOFOL N/A 11/27/2021   Procedure: ENDOSCOPIC RETROGRADE CHOLANGIOPANCREATOGRAPHY (ERCP) WITH PROPOFOL;  Surgeon: Rachael Fee, MD;  Location: [facility] ENDOSCOPY;  Service: Endoscopy;  Laterality: N/A;  possible EGD prior   ENDOSCOPIC RETROGRADE CHOLANGIOPANCREATOGRAPHY (ERCP) WITH PROPOFOL N/A 02/11/2022   Procedure: ENDOSCOPIC RETROGRADE CHOLANGIOPANCREATOGRAPHY (ERCP) WITH PROPOFOL;  Surgeon: Meridee Score Netty Starring., MD;  Location: [facility] ENDOSCOPY;  Service: Gastroenterology;  Laterality: N/A;   ESOPHAGOGASTRODUODENOSCOPY N/A  11/27/2021   Procedure: ESOPHAGOGASTRODUODENOSCOPY (EGD);  Surgeon: Rachael Fee, MD;  Location: [facility] ENDOSCOPY;  Service: Endoscopy;  Laterality: N/A;   ESOPHAGOGASTRODUODENOSCOPY (EGD) WITH PROPOFOL N/A 03/05/2021   Procedure: ESOPHAGOGASTRODUODENOSCOPY (EGD) WITH PROPOFOL;  Surgeon: Meridee Score Netty Starring., MD;   Location: [facility] ENDOSCOPY;  Service: Gastroenterology;  Laterality: N/A;   EUS N/A 03/05/2021   Procedure: UPPER ENDOSCOPIC ULTRASOUND (EUS) RADIAL;  Surgeon: Lemar Lofty., MD;  Location: [facility] ENDOSCOPY;  Service: Gastroenterology;  Laterality: N/A;   LITHOTRIPSY  02/11/2022   Procedure: LITHOTRIPSY;  Surgeon: Mansouraty, Netty Starring., MD;  Location: [facility] ENDOSCOPY;  Service: Gastroenterology;;   LUMBAR LAMINECTOMY     REMOVAL OF STONES  03/05/2021   Procedure: REMOVAL OF STONES;  Surgeon: Lemar Lofty., MD;  Location: [facility] ENDOSCOPY;  Service: Gastroenterology;;   REMOVAL OF STONES  11/27/2021   Procedure: REMOVAL OF STONES;  Surgeon: Rachael Fee, MD;  Location: [facility] ENDOSCOPY;  Service: Endoscopy;;   REMOVAL OF STONES  02/11/2022   Procedure: REMOVAL OF STONES;  Surgeon: Lemar Lofty., MD;  Location: [facility] ENDOSCOPY;  Service: Gastroenterology;;   SPINAL CORD STIMULATOR IMPLANT     SPYGLASS CHOLANGIOSCOPY N/A 03/05/2021   Procedure: [id number] CHOLANGIOSCOPY;  Surgeon: Lemar Lofty., MD;  Location: [facility] ENDOSCOPY;  Service: Gastroenterology;  Laterality: N/A;   SPYGLASS CHOLANGIOSCOPY N/A 02/11/2022   Procedure: SPYGLASS CHOLANGIOSCOPY;  Surgeon: Lemar Lofty., MD;  Location: [facility] ENDOSCOPY;  Service: Gastroenterology;  Laterality: N/A;   SPYGLASS LITHOTRIPSY N/A 03/05/2021   Procedure: [id number] LITHOTRIPSY;  Surgeon: Lemar Lofty., MD;  Location: [facility] ENDOSCOPY;  Service: Gastroenterology;  Laterality: N/A;   SPYGLASS LITHOTRIPSY N/A 02/11/2022   Procedure: FIEPPIRJ LITHOTRIPSY;  Surgeon: Lemar Lofty., MD;  Location: [facility] ENDOSCOPY;  Service: Gastroenterology;  Laterality: N/A;   STENT REMOVAL  02/11/2022   Procedure: STENT REMOVAL;  Surgeon: Lemar Lofty., MD;  Location: [facility] ENDOSCOPY;  Service: Gastroenterology;;   TONSILLECTOMY     TOTAL KNEE ARTHROPLASTY Right      Current Outpatient Medications  Medication Sig Dispense Refill    acetaminophen (TYLENOL) 325 MG tablet Take 352-650 mg by mouth every 6 (six) hours as needed for mild pain or moderate pain.     bumetanide (BUMEX) 1 MG tablet Take 0.5-1 tablets (0.5-1 mg total) by mouth daily as needed (swelling). 90 tablet 1   docusate sodium (COLACE) 100 MG capsule Take 1 capsule (100 mg total) by mouth 2 (two) times daily. 10 capsule 0   famciclovir (FAMVIR) 125 MG tablet TAKE 1 TABLET DAILY 90 tablet 3   glimepiride (AMARYL) 4 MG tablet Take 1 tablet (4 mg total) by mouth 2 (two) times daily. 180 tablet 1   glucose blood (FREESTYLE LITE) test strip CHECK BLOOD SUGAR TWICE A DAY 200 strip 12   JARDIANCE 25 MG TABS tablet TAKE 1 TABLET DAILY BEFORE BREAKFAST 90 tablet 3   latanoprost (XALATAN) 0.005 % ophthalmic solution Place 1 drop into both eyes at bedtime. 2.5 mL 2   linaclotide (LINZESS) 145 MCG CAPS capsule TAKE 1 CAPSULE DAILY BEFORE BREAKFAST 90 capsule 1   lipase/protease/amylase (CREON) 36000 UNITS CPEP capsule Take 1 capsule (36,000 Units total) by mouth 3 (three) times daily before meals. 270 capsule 1   mirtazapine (REMERON) 15 MG tablet TAKE 1 TABLET AT BEDTIME 90 tablet 3   morphine (MS CONTIN) 30 MG 12 hr tablet Take 30 mg by mouth every 12 (twelve) hours.     Multiple Vitamin (MULTIVITAMIN WITH MINERALS) TABS tablet  Take 1 tablet by mouth daily with supper.     omeprazole (PRILOSEC) 20 MG capsule Take 1 capsule (20 mg total) by mouth 2 (two) times daily before a meal. 180 capsule 1   senna (SENOKOT) 8.6 MG TABS tablet Take 1 tablet (8.6 mg total) by mouth daily. (Patient taking differently: Take 2 tablets by mouth in the morning.) 120 tablet 0   valsartan (DIOVAN) 40 MG tablet TAKE 1 TABLET DAILY WITH BREAKFAST 90 tablet 1   XARELTO 20 MG TABS tablet TAKE 1 TABLET DAILY WITH SUPPER, START ONLY AFTER THE STARTER PACK HAS BEEN COMPLETED 90 tablet 3   No current facility-administered medications for this visit.    Allergies:   Gramicidin, Sulfa antibiotics,  and Wound dressing adhesive    Social History:  The patient  reports that she quit smoking about 34 years ago. Her smoking use included cigarettes. She has a 35.00 pack-year smoking history. She has never used smokeless tobacco. She reports that she does not currently use alcohol. She reports that she does not use drugs.   Family History:  The patient's family history includes Diabetes in her daughter, mother, and son; Heart disease in her father; Hypertension in her daughter, daughter, son, and son; Lung cancer in her sister and son.    ROS:  Please see the history of present illness.   Otherwise, review of systems are positive for dizziness last week.   All other systems are reviewed and negative.    PHYSICAL EXAM: VS:  BP (!) 138/46   Pulse 63   Ht 5\' 4"  (1.626 m)   Wt 151 lb 9.6 oz (68.8 kg)   SpO2 98%   BMI 26.02 kg/m  , BMI Body mass index is 26.02 kg/m. GEN: Well nourished, well developed, in no acute distress HEENT: normal Neck: no JVD, ; bilateral carotid bruits noted, ; no masses Cardiac: RRR, premature beats; no murmurs, rubs, or gallops,no edema  Respiratory:  clear to auscultation bilaterally, normal work of breathing GI: soft, nontender, nondistended, + BS MS: no deformity or atrophy Skin: warm and dry, no rash Neuro:  Strength and sensation are intact Psych: euthymic mood, full affect   EKG:   The ekg ordered 5/16 demonstrates NSR, no ST changes   Recent Labs: 04/02/2023: ALT 20 05/01/2023: BUN 21; Creatinine, Ser 0.95; Hemoglobin 12.1; Magnesium 2.1; Platelets 169; Potassium 3.5; Sodium 137   Lipid Panel    Component Value Date/Time   CHOL 135 10/02/2022 1456   TRIG 109.0 10/02/2022 1456   HDL 56.90 10/02/2022 1456   CHOLHDL 2 10/02/2022 1456   VLDL 21.8 10/02/2022 1456   LDLCALC 57 10/02/2022 1456   LDLCALC 78 10/19/2020 1430   LDLDIRECT 71.0 04/02/2022 1737     Other studies Reviewed: Additional studies/ records that were reviewed today with  results demonstrating: Labs reviewed.   ASSESSMENT AND PLAN:  Bradycardia: Heart rate in the normal range today.  PVCs about every 5 or 6 beats noted on exam.  At times in the emergency room, she had bigeminy.  I explained to her that her radial pulse rate may be falsely low in the setting of frequent PVCs.  She has not had any further dizzy spells, like she had prior to her visit to the primary care doctor last week.  She denies syncope.  No high risk features.  Await 3-day Zio patch that was ordered by Dr. Royann Shivers PVCs: Decreased today.  No symptoms when she was having them while I was  examining her. DM: A1C 7.9 in October 2023.  Recheck A1c today.  Will also check TSH today given the arrhythmia. Carotid bruits: Prior carotid ultrasound done for bruits showed no significant carotid disease.   Current medicines are reviewed at length with the patient today.  The patient concerns regarding her medicines were addressed.  The following changes have been made:  No change  Labs/ tests ordered today include:  No orders of the defined types were placed in this encounter.   Recommend 150 minutes/week of aerobic exercise Low fat, low carb, high fiber diet recommended  Disposition:   FU in based on Zio patch results   Signed, Lance Muss, MD  05/05/2023 8:45 AM    [facility] [address] [place], [place], [place]  [address] Phone: [phone]; Fax: [phone]

## 2023-05-05 ENCOUNTER — Ambulatory Visit (INDEPENDENT_AMBULATORY_CARE_PROVIDER_SITE_OTHER): Payer: Medicare Other | Admitting: Interventional Cardiology

## 2023-05-05 ENCOUNTER — Encounter: Payer: Self-pay | Admitting: Interventional Cardiology

## 2023-05-05 ENCOUNTER — Other Ambulatory Visit: Payer: Self-pay | Admitting: Family Medicine

## 2023-05-05 VITALS — BP 138/46 | HR 63 | Ht 64.0 in | Wt 151.6 lb

## 2023-05-05 DIAGNOSIS — I493 Ventricular premature depolarization: Secondary | ICD-10-CM

## 2023-05-05 DIAGNOSIS — R42 Dizziness and giddiness: Secondary | ICD-10-CM | POA: Diagnosis not present

## 2023-05-05 DIAGNOSIS — Z7984 Long term (current) use of oral hypoglycemic drugs: Secondary | ICD-10-CM

## 2023-05-05 DIAGNOSIS — E119 Type 2 diabetes mellitus without complications: Secondary | ICD-10-CM

## 2023-05-05 DIAGNOSIS — R001 Bradycardia, unspecified: Secondary | ICD-10-CM

## 2023-05-05 NOTE — Patient Instructions (Signed)
Medication Instructions:  Your physician recommends that you continue on your current medications as directed. Please refer to the Current Medication list given to you today.  *If you need a refill on your cardiac medications before your next appointment, please call your pharmacy*   Lab Work: Lab work to be done today--TSH, A1C If you have labs (blood work) drawn today and your tests are completely normal, you will receive your results only by: MyChart Message (if you have MyChart) OR A paper copy in the mail If you have any lab test that is abnormal or we need to change your treatment, we will call you to review the results.   Testing/Procedures: none   Follow-Up: At [facility], you and your health needs are our priority.  As part of our continuing mission to provide you with exceptional heart care, we have created designated Provider Care Teams.  These Care Teams include your primary Cardiologist (physician) and Advanced Practice Providers (APPs -  Physician Assistants and Nurse Practitioners) who all work together to provide you with the care you need, when you need it.  We recommend signing up for the patient portal called "MyChart".  Sign up information is provided on this After Visit Summary.  MyChart is used to connect with patients for Virtual Visits (Telemedicine).  Patients are able to view lab/test results, encounter notes, upcoming appointments, etc.  Non-urgent messages can be sent to your provider as well.   To learn more about what you can do with MyChart, go to [email or web address].    Your next appointment:   Based on results  Provider:   Lance Muss, MD     Other Instructions

## 2023-05-06 DIAGNOSIS — I452 Bifascicular block: Secondary | ICD-10-CM | POA: Diagnosis not present

## 2023-05-06 DIAGNOSIS — I493 Ventricular premature depolarization: Secondary | ICD-10-CM

## 2023-05-06 DIAGNOSIS — I498 Other specified cardiac arrhythmias: Secondary | ICD-10-CM

## 2023-05-06 DIAGNOSIS — R42 Dizziness and giddiness: Secondary | ICD-10-CM | POA: Diagnosis not present

## 2023-05-06 LAB — TSH: TSH: 3.55 u[IU]/mL (ref 0.450–4.500)

## 2023-05-06 LAB — HEMOGLOBIN A1C
Est. average glucose Bld gHb Est-mCnc: 163 mg/dL
Hgb A1c MFr Bld: 7.3 % — ABNORMAL HIGH (ref 4.8–5.6)

## 2023-05-16 ENCOUNTER — Encounter: Payer: Self-pay | Admitting: Interventional Cardiology

## 2023-05-19 NOTE — Progress Notes (Signed)
11% PVC burden would not likely warrant stronger antiarrhythmics in a 89 yearold.  Still < 20%.  If they want to see EP, we can refer.

## 2023-05-19 NOTE — Telephone Encounter (Signed)
Based on the monitor results, it does not look like the dizziness correlates to the PVCs.   If we were to try to add therapy, I think the next reasonable step would be trying low dose metoprolol, 12.5 mg BID, but would have to watch for worsening dizziness from the medicine or from bradycardia as her average HR was only 64 bpm.   I think trying to stay hydrated and considering other causes of dizziness would be reasonable as well.  I think we have to be more careful due to her age when considering medications like metoprolol.

## 2023-07-03 ENCOUNTER — Other Ambulatory Visit: Payer: Self-pay | Admitting: Family Medicine

## 2023-07-10 ENCOUNTER — Telehealth: Payer: Self-pay | Admitting: Family Medicine

## 2023-07-10 NOTE — Telephone Encounter (Signed)
Copied from CRM [id number]. Topic: Medicare AWV >> Jul 10, 2023 11:34 AM Payton Doughty wrote: Reason for CRM: LM 07/10/2023 to schedule AWV   Verlee Rossetti; Care Guide Ambulatory Clinical Support [place] [facility] Direct Dial: [phone]

## 2023-07-27 ENCOUNTER — Other Ambulatory Visit: Payer: Self-pay

## 2023-07-27 ENCOUNTER — Inpatient Hospital Stay (HOSPITAL_BASED_OUTPATIENT_CLINIC_OR_DEPARTMENT_OTHER)
Admission: EM | Admit: 2023-07-27 | Discharge: 2023-07-29 | DRG: 309 | Disposition: A | Discharge status: Home Health | Payer: Medicare Other | Attending: Family Medicine | Admitting: Family Medicine

## 2023-07-27 ENCOUNTER — Encounter (HOSPITAL_BASED_OUTPATIENT_CLINIC_OR_DEPARTMENT_OTHER): Payer: Self-pay | Admitting: Emergency Medicine

## 2023-07-27 ENCOUNTER — Emergency Department (HOSPITAL_BASED_OUTPATIENT_CLINIC_OR_DEPARTMENT_OTHER): Payer: Medicare Other

## 2023-07-27 DIAGNOSIS — I5031 Acute diastolic (congestive) heart failure: Secondary | ICD-10-CM | POA: Diagnosis not present

## 2023-07-27 DIAGNOSIS — I10 Essential (primary) hypertension: Secondary | ICD-10-CM | POA: Diagnosis not present

## 2023-07-27 DIAGNOSIS — I452 Bifascicular block: Principal | ICD-10-CM | POA: Diagnosis present

## 2023-07-27 DIAGNOSIS — I471 Supraventricular tachycardia, unspecified: Secondary | ICD-10-CM | POA: Diagnosis present

## 2023-07-27 DIAGNOSIS — I5032 Chronic diastolic (congestive) heart failure: Secondary | ICD-10-CM | POA: Diagnosis present

## 2023-07-27 DIAGNOSIS — Z90710 Acquired absence of both cervix and uterus: Secondary | ICD-10-CM

## 2023-07-27 DIAGNOSIS — R001 Bradycardia, unspecified: Secondary | ICD-10-CM | POA: Diagnosis present

## 2023-07-27 DIAGNOSIS — Z8501 Personal history of malignant neoplasm of esophagus: Secondary | ICD-10-CM | POA: Diagnosis not present

## 2023-07-27 DIAGNOSIS — Z888 Allergy status to other drugs, medicaments and biological substances status: Secondary | ICD-10-CM

## 2023-07-27 DIAGNOSIS — I4510 Unspecified right bundle-branch block: Secondary | ICD-10-CM | POA: Diagnosis present

## 2023-07-27 DIAGNOSIS — Z882 Allergy status to sulfonamides status: Secondary | ICD-10-CM | POA: Diagnosis not present

## 2023-07-27 DIAGNOSIS — Z91048 Other nonmedicinal substance allergy status: Secondary | ICD-10-CM

## 2023-07-27 DIAGNOSIS — E1165 Type 2 diabetes mellitus with hyperglycemia: Secondary | ICD-10-CM

## 2023-07-27 DIAGNOSIS — Z96651 Presence of right artificial knee joint: Secondary | ICD-10-CM | POA: Diagnosis present

## 2023-07-27 DIAGNOSIS — I110 Hypertensive heart disease with heart failure: Secondary | ICD-10-CM | POA: Diagnosis present

## 2023-07-27 DIAGNOSIS — Z86711 Personal history of pulmonary embolism: Secondary | ICD-10-CM | POA: Diagnosis not present

## 2023-07-27 DIAGNOSIS — Z7901 Long term (current) use of anticoagulants: Secondary | ICD-10-CM

## 2023-07-27 DIAGNOSIS — Z66 Do not resuscitate: Secondary | ICD-10-CM | POA: Diagnosis present

## 2023-07-27 DIAGNOSIS — Z79899 Other long term (current) drug therapy: Secondary | ICD-10-CM

## 2023-07-27 DIAGNOSIS — Z9682 Presence of neurostimulator: Secondary | ICD-10-CM

## 2023-07-27 DIAGNOSIS — I493 Ventricular premature depolarization: Secondary | ICD-10-CM | POA: Diagnosis not present

## 2023-07-27 DIAGNOSIS — Z1152 Encounter for screening for COVID-19: Secondary | ICD-10-CM | POA: Diagnosis not present

## 2023-07-27 DIAGNOSIS — Z833 Family history of diabetes mellitus: Secondary | ICD-10-CM

## 2023-07-27 DIAGNOSIS — R32 Unspecified urinary incontinence: Secondary | ICD-10-CM | POA: Diagnosis present

## 2023-07-27 DIAGNOSIS — E119 Type 2 diabetes mellitus without complications: Secondary | ICD-10-CM | POA: Diagnosis present

## 2023-07-27 DIAGNOSIS — G8929 Other chronic pain: Secondary | ICD-10-CM | POA: Diagnosis present

## 2023-07-27 DIAGNOSIS — Z8542 Personal history of malignant neoplasm of other parts of uterus: Secondary | ICD-10-CM

## 2023-07-27 DIAGNOSIS — Z8249 Family history of ischemic heart disease and other diseases of the circulatory system: Secondary | ICD-10-CM

## 2023-07-27 DIAGNOSIS — K2100 Gastro-esophageal reflux disease with esophagitis, without bleeding: Secondary | ICD-10-CM | POA: Diagnosis present

## 2023-07-27 DIAGNOSIS — Z7984 Long term (current) use of oral hypoglycemic drugs: Secondary | ICD-10-CM

## 2023-07-27 DIAGNOSIS — E785 Hyperlipidemia, unspecified: Secondary | ICD-10-CM | POA: Diagnosis present

## 2023-07-27 DIAGNOSIS — G4733 Obstructive sleep apnea (adult) (pediatric): Secondary | ICD-10-CM | POA: Diagnosis present

## 2023-07-27 DIAGNOSIS — R5383 Other fatigue: Secondary | ICD-10-CM | POA: Diagnosis present

## 2023-07-27 DIAGNOSIS — Z87891 Personal history of nicotine dependence: Secondary | ICD-10-CM

## 2023-07-27 DIAGNOSIS — Z801 Family history of malignant neoplasm of trachea, bronchus and lung: Secondary | ICD-10-CM

## 2023-07-27 DIAGNOSIS — M1990 Unspecified osteoarthritis, unspecified site: Secondary | ICD-10-CM | POA: Diagnosis present

## 2023-07-27 LAB — CBC WITH DIFFERENTIAL/PLATELET
Abs Immature Granulocytes: 0.01 10*3/uL (ref 0.00–0.07)
Basophils Absolute: 0 10*3/uL (ref 0.0–0.1)
Basophils Relative: 1 %
Eosinophils Absolute: 0.1 10*3/uL (ref 0.0–0.5)
Eosinophils Relative: 1 %
HCT: 36.9 % (ref 36.0–46.0)
Hemoglobin: 11.7 g/dL — ABNORMAL LOW (ref 12.0–15.0)
Immature Granulocytes: 0 %
Lymphocytes Relative: 15 %
Lymphs Abs: 1.1 10*3/uL (ref 0.7–4.0)
MCH: 28.1 pg (ref 26.0–34.0)
MCHC: 31.7 g/dL (ref 30.0–36.0)
MCV: 88.5 fL (ref 80.0–100.0)
Monocytes Absolute: 0.4 10*3/uL (ref 0.1–1.0)
Monocytes Relative: 5 %
Neutro Abs: 5.5 10*3/uL (ref 1.7–7.7)
Neutrophils Relative %: 78 %
Platelets: 178 10*3/uL (ref 150–400)
RBC: 4.17 MIL/uL (ref 3.87–5.11)
RDW: 14.5 % (ref 11.5–15.5)
WBC: 7 10*3/uL (ref 4.0–10.5)
nRBC: 0 % (ref 0.0–0.2)

## 2023-07-27 LAB — TROPONIN I (HIGH SENSITIVITY)
Troponin I (High Sensitivity): 9 ng/L (ref <18)
Troponin I (High Sensitivity): 8 ng/L (ref <18)

## 2023-07-27 LAB — URINALYSIS, ROUTINE W REFLEX MICROSCOPIC
Bilirubin Urine: NEGATIVE
Glucose, UA: >=500 mg/dL — AB
Hgb urine dipstick: NEGATIVE
Ketones, ur: NEGATIVE mg/dL
Leukocytes,Ua: NEGATIVE
Nitrite: NEGATIVE
Protein, ur: 30 mg/dL — AB
Specific Gravity, Urine: 1.02 (ref 1.005–1.030)
pH: 5.5 (ref 5.0–8.0)

## 2023-07-27 LAB — COMPREHENSIVE METABOLIC PANEL
ALT: 21 U/L (ref 0–44)
AST: 16 U/L (ref 15–41)
Albumin: 3.3 g/dL — ABNORMAL LOW (ref 3.5–5.0)
Alkaline Phosphatase: 71 U/L (ref 38–126)
Anion gap: 7 (ref 5–15)
BUN: 26 mg/dL — ABNORMAL HIGH (ref 8–23)
CO2: 22 mmol/L (ref 22–32)
Calcium: 9.2 mg/dL (ref 8.9–10.3)
Chloride: 110 mmol/L (ref 98–111)
Creatinine, Ser: 0.92 mg/dL (ref 0.44–1.00)
GFR, Estimated: 58 mL/min — ABNORMAL LOW (ref >60)
Glucose, Bld: 173 mg/dL — ABNORMAL HIGH (ref 70–99)
Potassium: 4.1 mmol/L (ref 3.5–5.1)
Sodium: 139 mmol/L (ref 135–145)
Total Bilirubin: 0.4 mg/dL (ref 0.3–1.2)
Total Protein: 6.5 g/dL (ref 6.5–8.1)

## 2023-07-27 LAB — URINALYSIS, MICROSCOPIC (REFLEX)

## 2023-07-27 LAB — BRAIN NATRIURETIC PEPTIDE: B Natriuretic Peptide: 334.3 pg/mL — ABNORMAL HIGH (ref 0.0–100.0)

## 2023-07-27 LAB — TSH: TSH: 2.056 u[IU]/mL (ref 0.350–4.500)

## 2023-07-27 LAB — GLUCOSE, CAPILLARY: Glucose-Capillary: 139 mg/dL — ABNORMAL HIGH (ref 70–99)

## 2023-07-27 MED ORDER — SODIUM CHLORIDE 0.9% FLUSH: 3.0000 mL | INTRAVENOUS | Status: DC | PRN

## 2023-07-27 MED ORDER — PANTOPRAZOLE SODIUM 40 MG PO TBEC
40.0000 mg | DELAYED_RELEASE_TABLET | Freq: Every day | ORAL | Status: DC
  Administered 2023-07-28 – 2023-07-29 (×2): 40 mg via ORAL
  Filled 2023-07-27 (×2): qty 1

## 2023-07-27 MED ORDER — SENNA 8.6 MG PO TABS
2.0000 | ORAL_TABLET | Freq: Every morning | ORAL | Status: DC
  Administered 2023-07-28 – 2023-07-29 (×2): 17.2 mg via ORAL
  Filled 2023-07-27 (×2): qty 2

## 2023-07-27 MED ORDER — MIRTAZAPINE 15 MG PO TABS
15.0000 mg | ORAL_TABLET | Freq: Every day | ORAL | Status: DC
  Administered 2023-07-27 – 2023-07-28 (×2): 15 mg via ORAL
  Filled 2023-07-27 (×3): qty 1

## 2023-07-27 MED ORDER — IRBESARTAN 75 MG PO TABS
75.0000 mg | ORAL_TABLET | Freq: Every day | ORAL | Status: DC
  Administered 2023-07-28 – 2023-07-29 (×2): 75 mg via ORAL
  Filled 2023-07-27 (×2): qty 1

## 2023-07-27 MED ORDER — ACETAMINOPHEN 650 MG RE SUPP: 650.0000 mg | Freq: Four times a day (QID) | RECTAL | Status: DC | PRN

## 2023-07-27 MED ORDER — ORAL CARE MOUTH RINSE: 15.0000 mL | OROMUCOSAL | Status: DC | PRN

## 2023-07-27 MED ORDER — ACETAMINOPHEN 325 MG PO TABS: 650.0000 mg | ORAL_TABLET | Freq: Four times a day (QID) | ORAL | Status: DC | PRN

## 2023-07-27 MED ORDER — PANCRELIPASE (LIP-PROT-AMYL) 12000-38000 UNITS PO CPEP
36000.0000 [IU] | ORAL_CAPSULE | Freq: Three times a day (TID) | ORAL | Status: DC
  Administered 2023-07-28 – 2023-07-29 (×6): 36000 [IU] via ORAL
  Filled 2023-07-27 (×6): qty 3

## 2023-07-27 MED ORDER — GLIMEPIRIDE 4 MG PO TABS
4.0000 mg | ORAL_TABLET | Freq: Two times a day (BID) | ORAL | Status: DC
  Administered 2023-07-27 – 2023-07-29 (×5): 4 mg via ORAL
  Filled 2023-07-27 (×7): qty 1

## 2023-07-27 MED ORDER — ZOLPIDEM TARTRATE 5 MG PO TABS: 5.0000 mg | ORAL_TABLET | Freq: Every evening | ORAL | Status: DC | PRN

## 2023-07-27 MED ORDER — SODIUM CHLORIDE 0.9 % IV SOLN: 250.0000 mL | INTRAVENOUS | Status: DC | PRN

## 2023-07-27 MED ORDER — VALACYCLOVIR HCL 500 MG PO TABS
250.0000 mg | ORAL_TABLET | Freq: Every day | ORAL | Status: DC
  Administered 2023-07-28 – 2023-07-29 (×2): 250 mg via ORAL
  Filled 2023-07-27 (×2): qty 0.5

## 2023-07-27 MED ORDER — EMPAGLIFLOZIN 25 MG PO TABS
25.0000 mg | ORAL_TABLET | Freq: Every day | ORAL | Status: DC
  Administered 2023-07-28 – 2023-07-29 (×2): 25 mg via ORAL
  Filled 2023-07-27 (×3): qty 1

## 2023-07-27 MED ORDER — RIVAROXABAN 20 MG PO TABS
20.0000 mg | ORAL_TABLET | Freq: Every day | ORAL | Status: DC
  Administered 2023-07-27 – 2023-07-29 (×3): 20 mg via ORAL
  Filled 2023-07-27 (×3): qty 1

## 2023-07-27 MED ORDER — LATANOPROST 0.005 % OP SOLN
1.0000 [drp] | Freq: Every day | OPHTHALMIC | Status: DC
  Administered 2023-07-27 – 2023-07-28 (×2): 1 [drp] via OPHTHALMIC
  Filled 2023-07-27: qty 2.5

## 2023-07-27 MED ORDER — BUMETANIDE 0.5 MG PO TABS: 0.5000 mg | ORAL_TABLET | Freq: Every day | ORAL | Status: DC | PRN

## 2023-07-27 MED ORDER — ACETAMINOPHEN 325 MG PO TABS: 352.0000 mg | ORAL_TABLET | Freq: Four times a day (QID) | ORAL | Status: DC | PRN

## 2023-07-27 MED ORDER — MORPHINE SULFATE ER 30 MG PO TBCR
30.0000 mg | EXTENDED_RELEASE_TABLET | Freq: Two times a day (BID) | ORAL | Status: DC
  Administered 2023-07-27 – 2023-07-29 (×4): 30 mg via ORAL
  Filled 2023-07-27 (×4): qty 1

## 2023-07-27 MED ORDER — LINACLOTIDE 145 MCG PO CAPS
145.0000 ug | ORAL_CAPSULE | Freq: Every day | ORAL | Status: DC
  Administered 2023-07-28 – 2023-07-29 (×2): 145 ug via ORAL
  Filled 2023-07-27 (×5): qty 1

## 2023-07-27 MED ORDER — SODIUM CHLORIDE 0.9% FLUSH
3.0000 mL | Freq: Two times a day (BID) | INTRAVENOUS | Status: DC
  Administered 2023-07-27 – 2023-07-29 (×4): 3 mL via INTRAVENOUS

## 2023-07-27 MED ORDER — VALACYCLOVIR HCL 500 MG PO TABS: 1000.0000 mg | ORAL_TABLET | Freq: Every day | ORAL | Status: DC

## 2023-07-27 MED ORDER — INSULIN ASPART 100 UNIT/ML IJ SOLN
0.0000 [IU] | Freq: Three times a day (TID) | INTRAMUSCULAR | Status: DC
  Administered 2023-07-28 – 2023-07-29 (×3): 2 [IU] via SUBCUTANEOUS

## 2023-07-27 MED ORDER — DOCUSATE SODIUM 100 MG PO CAPS: 100.0000 mg | ORAL_CAPSULE | Freq: Two times a day (BID) | ORAL | Status: DC

## 2023-07-27 MED ORDER — DOCUSATE SODIUM 100 MG PO CAPS
100.0000 mg | ORAL_CAPSULE | Freq: Two times a day (BID) | ORAL | Status: DC
  Administered 2023-07-27 – 2023-07-29 (×4): 100 mg via ORAL
  Filled 2023-07-27 (×4): qty 1

## 2023-07-27 NOTE — ED Notes (Signed)
Pt advised she's been watching her HR at home. It has been dropping consistently for at least a month. Daughter advised it's not a matter of if she will get an AICD, but when. The pt has complained of 3x days of acute fatigue and extra sleeping. No other illness or fever noted. No palpitations, CP, SOB, diaphoresis, or distress. Intermittent episodes of dizziness. No neurological deficits.   Pt in bed, NAD, on the monitor, speaking clearly in full sentences without pain. Call bell within reach.

## 2023-07-27 NOTE — Assessment & Plan Note (Signed)
Patient well controlled for her age with last A1C 7.3% 05/05/23  Plan Continue home regimen  Ss coverage

## 2023-07-27 NOTE — ED Notes (Signed)
Not in room, remains in radiology

## 2023-07-27 NOTE — ED Notes (Signed)
Called [facility] for transport talked to Kim at 4:34

## 2023-07-27 NOTE — ED Triage Notes (Signed)
Pt's daughter reports pt's HR low (40s) since Fri; sts pt's mentation is "slower" than normal; pt is oriented to year only, but answers questions appropriately

## 2023-07-27 NOTE — Assessment & Plan Note (Signed)
No active complaints.   Plan Continue PPI.

## 2023-07-27 NOTE — Assessment & Plan Note (Signed)
Last lipid panel 10/02/22  total cholesterol 135, HDL 71, LDL 57  Plan Continue home regimen

## 2023-07-27 NOTE — ED Notes (Signed)
Spoke with receiving nurse on 6E and she has no additional questions regarding this patient.

## 2023-07-27 NOTE — ED Notes (Signed)
Pt and family made aware that pt room is ready and transport has been called.

## 2023-07-27 NOTE — ED Notes (Signed)
Pt assisted to wheelchair and rolled to bathroom by daughter (who's an [employer] fundraiser)

## 2023-07-27 NOTE — Plan of Care (Signed)

## 2023-07-27 NOTE — ED Notes (Signed)
[facility] here to transport at this time.

## 2023-07-27 NOTE — Subjective & Objective (Signed)
[name], a 89 y/o with medical h/o DM, HTN, HLD, GERD, chronic pain,, h/o PE  CHF was seen for bradycardia by Dr. Alex Gardener May '84. She has had increased fatigue and dizziness and presented to [facility] for evaluation. She was found to be bradycardic. EDP consulted Dr. Servando Salina, [facility], who recommended directed admission to cardiac telemetry for further evaluation

## 2023-07-27 NOTE — Assessment & Plan Note (Signed)
BP adequately controlled on present regimen  Plan Continue home medication

## 2023-07-27 NOTE — ED Notes (Signed)
Pt alert, NAD, calm, interactive, resps e/u, speaking in clear complete sentences. Endorses fatigue. Denies pain, sob, NV, dizziness, light headedness. Skin W&D. Frequent ectopy on monitor, HR irregular 66.

## 2023-07-27 NOTE — ED Provider Notes (Signed)
[place] [facility] Provider Note   CSN: [id number] Arrival date & time: 07/27/23  1145     History  Chief Complaint  Patient presents with   Bradycardia    CHIQUITTA ARNALL is a 89 y.o. female.  HPI 89 year old female history of diabetes, GERD, hypertension, hyperlipidemia presenting for fatigue.  Patient is here with her daughter.  Patient at baseline lives alone and does all of her ADLs.  She over last few months has been worked up for bradycardia and found to have frequent PVCs as well as intermittent bigeminy.  They talked about a potential pacemaker but she has not needed it.  She has intermittent dizziness although no new.  Over the last couple weeks has been very fatigued over the last few days she has had acute worsening of her fatigue as well as some slower mentation.  Patient states she does feel somewhat foggy but denies headache, weakness, numbness, vision changes.  No chest pain or shortness of breath.  No syncope or falls or injury.  She not had any fevers or abdominal pain or vomiting or diarrhea.  No urinary symptoms.  She does have some worsening lower EXTR edema bilaterally which is new.     Home Medications Prior to Admission medications   Medication Sig Start Date End Date Taking? Authorizing Provider  acetaminophen (TYLENOL) 325 MG tablet Take 352-650 mg by mouth every 6 (six) hours as needed for mild pain or moderate pain.    [provider]  bumetanide (BUMEX) 1 MG tablet Take 0.5-1 tablets (0.5-1 mg total) by mouth daily as needed (swelling). 08/06/22   Donato Schultz, DO  CREON [phone] units CPEP capsule TAKE 1 CAPSULE THREE TIMES A DAY BEFORE MEALS 05/05/23   Zola Button, Grayling Congress, DO  docusate sodium (COLACE) 100 MG capsule Take 1 capsule (100 mg total) by mouth 2 (two) times daily. 03/18/21   Osvaldo Shipper, MD  famciclovir [facility]) 125 MG tablet TAKE 1 TABLET DAILY 03/31/23   Zola Button, Grayling Congress, DO   glimepiride (AMARYL) 4 MG tablet Take 1 tablet (4 mg total) by mouth 2 (two) times daily. 07/03/23   Seabron Spates R, DO  glucose blood (FREESTYLE LITE) test strip CHECK BLOOD SUGAR TWICE A DAY 11/20/22   Zola Button, Grayling Congress, DO  JARDIANCE 25 MG TABS tablet TAKE 1 TABLET DAILY BEFORE BREAKFAST 02/04/23   Zola Button, Grayling Congress, DO  latanoprost (XALATAN) 0.005 % ophthalmic solution Place 1 drop into both eyes at bedtime. 08/24/21   Donato Schultz, DO  linaclotide (LINZESS) 145 MCG CAPS capsule TAKE 1 CAPSULE DAILY BEFORE BREAKFAST 01/20/23   Zola Button, Myrene Buddy R, DO  mirtazapine (REMERON) 15 MG tablet TAKE 1 TABLET AT BEDTIME 04/16/23   Zola Button, Grayling Congress, DO  morphine (MS CONTIN) 30 MG 12 hr tablet Take 30 mg by mouth every 12 (twelve) hours. 01/21/22   [provider]  Multiple Vitamin (MULTIVITAMIN WITH MINERALS) TABS tablet Take 1 tablet by mouth daily with supper.    [provider]  omeprazole (PRILOSEC) 20 MG capsule Take 1 capsule (20 mg total) by mouth 2 (two) times daily before a meal. 08/06/22   Zola Button, Grayling Congress, DO  senna (SENOKOT) 8.6 MG TABS tablet Take 1 tablet (8.6 mg total) by mouth daily. Patient taking differently: Take 2 tablets by mouth in the morning. 11/29/21   Regalado, Belkys A, MD  valsartan (DIOVAN) 40 MG tablet  TAKE 1 TABLET DAILY WITH BREAKFAST 01/20/23   Lowne [name], Yvonne R, DO  XARELTO 20 MG TABS tablet TAKE 1 TABLET DAILY WITH SUPPER, START ONLY AFTER THE STARTER PACK HAS BEEN COMPLETED 10/28/22   Donato Schultz, DO      Allergies    Gramicidin, Sulfa antibiotics, and Wound dressing adhesive    Review of Systems   Review of Systems Review of systems completed and notable as per HPI.  ROS otherwise negative.   Physical Exam Updated Vital Signs BP (!) 136/43   Pulse 69   Temp 98 F (36.7 C)   Resp 20   Ht 5\' 4"  (1.626 m)   Wt 68.5 kg   SpO2 93%   BMI 25.92 kg/m  Physical Exam Vitals and nursing note reviewed.   Constitutional:      General: She is not in acute distress.    Appearance: She is well-developed.  HENT:     Head: Normocephalic and atraumatic.     Mouth/Throat:     Mouth: Mucous membranes are moist.     Pharynx: Oropharynx is clear.  Eyes:     Extraocular Movements: Extraocular movements intact.     Conjunctiva/sclera: Conjunctivae normal.     Pupils: Pupils are equal, round, and reactive to light.  Cardiovascular:     Rate and Rhythm: Regular rhythm. Bradycardia present.     Heart sounds: No murmur heard. Pulmonary:     Effort: Pulmonary effort is normal. No respiratory distress.     Breath sounds: Normal breath sounds.  Abdominal:     Palpations: Abdomen is soft.     Tenderness: There is no abdominal tenderness.  Musculoskeletal:        General: No swelling.     Cervical back: Neck supple.     Right lower leg: Edema present.     Left lower leg: Edema present.  Skin:    General: Skin is warm and dry.     Capillary Refill: Capillary refill takes less than 2 seconds.  Neurological:     General: No focal deficit present.     Mental Status: She is alert and oriented to person, place, and time. Mental status is at baseline.     Cranial Nerves: No cranial nerve deficit.     Sensory: No sensory deficit.     Motor: No weakness.     Deep Tendon Reflexes: Reflexes normal.  Psychiatric:        Mood and Affect: Mood normal.     ED Results / Procedures / Treatments   Labs (all labs ordered are listed, but only abnormal results are displayed) Labs Reviewed  COMPREHENSIVE METABOLIC PANEL - Abnormal; Notable for the following components:      Result Value   Glucose, Bld 173 (*)    BUN 26 (*)    Albumin 3.3 (*)    GFR, Estimated 58 (*)    All other components within normal limits  CBC WITH DIFFERENTIAL/PLATELET - Abnormal; Notable for the following components:   Hemoglobin 11.7 (*)    All other components within normal limits  URINALYSIS, ROUTINE W REFLEX MICROSCOPIC -  Abnormal; Notable for the following components:   Glucose, UA >=500 (*)    Protein, ur 30 (*)    All other components within normal limits  BRAIN NATRIURETIC PEPTIDE - Abnormal; Notable for the following components:   B Natriuretic Peptide 334.3 (*)    All other components within normal limits  URINALYSIS, MICROSCOPIC (REFLEX) - Abnormal;  Notable for the following components:   Bacteria, UA RARE (*)    All other components within normal limits  TSH  TROPONIN I (HIGH SENSITIVITY)  TROPONIN I (HIGH SENSITIVITY)    EKG EKG Interpretation Date/Time:  Sunday July 27 2023 11:57:31 EDT Ventricular Rate:  62 PR Interval:  168 QRS Duration:  141 QT Interval:  456 QTC Calculation: 464 R Axis:   -53  Text Interpretation: Sinus rhythm Ventricular bigeminy RBBB and LAFB Probable left ventricular hypertrophy Confirmed by Fulton Reek [phone]) on 07/27/2023 12:03:02 PM  Radiology CT Head Wo Contrast  Result Date: 07/27/2023 CLINICAL DATA:  Bradycardia EXAM: CT HEAD WITHOUT CONTRAST TECHNIQUE: Contiguous axial images were obtained from the base of the skull through the vertex without intravenous contrast. RADIATION DOSE REDUCTION: This exam was performed according to the departmental dose-optimization program which includes automated exposure control, adjustment of the mA and/or kV according to patient size and/or use of iterative reconstruction technique. COMPARISON:  None Available. FINDINGS: Brain: No acute intracranial hemorrhage. No focal mass lesion. No CT evidence of acute infarction. No midline shift or mass effect. No hydrocephalus. Basilar cisterns are patent. There are periventricular and subcortical white matter hypodensities. Generalized cortical atrophy. Vascular: No hyperdense vessel or unexpected calcification. Skull: Normal. Negative for fracture or focal lesion. Sinuses/Orbits: Paranasal sinuses and mastoid air cells are clear. Orbits are clear. Other: None. IMPRESSION: 1. No acute  intracranial findings. 2. Atrophy and white matter microvascular disease. Electronically Signed   By: Genevive Bi M.D.   On: 07/27/2023 13:35   DG Chest 2 View  Result Date: 07/27/2023 CLINICAL DATA:  Fatigue.  Weakness.  Bradycardia. EXAM: CHEST - 2 VIEW COMPARISON:  05/01/2023 FINDINGS: The heart size and mediastinal contours are within normal limits. Stable linear opacity in left lung base, consistent with scarring. No evidence of acute infiltrate or edema. Thoracic spine neurostimulator leads noted. IMPRESSION: No active cardiopulmonary disease. Electronically Signed   By: Danae Orleans M.D.   On: 07/27/2023 12:58    Procedures Procedures    Medications Ordered in ED Medications - No data to display  ED Course/ Medical Decision Making/ A&P Clinical Course as of 07/27/23 1535  Sun Jul 27, 2023  1501 [place] [JD]    Clinical Course User Index [JD] Laurence Spates, MD                                 Medical Decision Making Amount and/or Complexity of Data Reviewed Labs: ordered. Radiology: ordered.  Risk Decision regarding hospitalization.   Medical Decision Making:   AFTEN LASHOMB is a 89 y.o. female who presented to the ED today with fatigue, cognitive changes.  Vital signs reviewed notable for wide pulse pressure although daughter states this is baseline.  Patient is intermittently bradycardic with significant PVC burden as well as occasional bigeminy.  I do not see any signs of acute ischemia and no chest pain, low concern for ACS.  I am concerned for possible stenotic bradycardia although she is not hypotensive, dizzy and no syncope.  She is history of significant PVC burden as well as bigeminy as well.  The cognitive slowing is concerning as well as her fatigue, could be related to the bradycardia.  She also has new lower extremity edema which is concerning for possible heart failure although not short of breath.  Consider possible infectious source or CVA as well  although no focal deficits.  Will  plan for broad workup.   Patient placed on continuous vitals and telemetry monitoring while in ED which was reviewed periodically.  Reviewed and confirmed nursing documentation for past medical history, family history, social history.  Initial Study Results:   Laboratory  All laboratory results reviewed.  Labs notable for newly elevated BNP.  Last echo in 2020 was unremarkable.  Mild anemia.  CMP grossly unremarkable.  Urinalysis without signs of infection.  Troponin normal.  EKG EKG was reviewed independently.  Radiology:  All images reviewed independently.  Chest x-ray unremarkable.  CT head without stroke.  Agree with radiology report at this time.      Consults: Case discussed with cardiology, hospitalist.   Reassessment and Plan:   Workup notable for new elevated BNP.  She does have lower extremity edema which could be causing some heart failure and fatigue although is not hypoxic.  No signs of stroke or other acute reversible metabolic cause of her fatigue.  I discussed the patient with Dr. Servando Salina given she follows with cardiology and she is having possible symptomatic bradycardia as well as new onset heart failure.  She recommends admission to the hospitalist at [facility] for further management.  Discussed with hospitalist and admitted.  Family updated on plan.   Patient's presentation is most consistent with acute complicated illness / injury requiring diagnostic workup.           Final Clinical Impression(s) / ED Diagnoses Final diagnoses:  Bradycardia    Rx / DC Orders ED Discharge Orders     None         Laurence Spates, MD 07/27/23 1535

## 2023-07-27 NOTE — H&P (Signed)
History and Physical    Nellene Yuhas Hole VHQ:[id number] DOB: August 02, 1928 DOA: 07/27/2023  DOS: the patient was seen and examined on 07/27/2023  PCP: Donato Schultz, DO   Patient coming from:  transfer from [facility], was at home  I have personally briefly reviewed patient's old medical records in [facility]  Ms. Double, a 89 y/o with medical h/o DM, HTN, HLD, GERD, chronic pain,, h/o PE  CHF was seen for bradycardia by Dr. Alex Gardener May '84. She has had increased fatigue and dizziness and presented to [facility] for evaluation. She was found to be bradycardic. EDP consulted Dr. Servando Salina, [facility], who recommended directed admission to cardiac telemetry for further evaluation   ED Course: T 97.4  157/46  HR 69  RR 18. Patient is a very bright chipper and delightful nanogenarian in no distress. Lab: CBCD nl, glucose 173, Cr 0.9 BNP 330 TSH 05/05/23 3.55. CT head - no acute findings. TRH called to admit patient to telemetry, order echo and cardiology will see 07/28/23  Review of Systems:  Review of Systems  Constitutional: Negative.   HENT:  Positive for hearing loss. Negative for congestion and tinnitus.   Eyes: Negative.   Respiratory: Negative.    Cardiovascular:  Negative for chest pain, palpitations, orthopnea and PND.  Gastrointestinal: Negative.   Genitourinary: Negative.   Musculoskeletal: Negative.   Skin: Negative.   Neurological: Negative.   Endo/Heme/Allergies: Negative.   Psychiatric/Behavioral: Negative.      Past Medical History:  Diagnosis Date   Arthritis    Choledocholithiasis    Diabetes mellitus without complication (HCC)    Diverticulitis    Esophageal cancer (HCC) dx'd 2020   GERD (gastroesophageal reflux disease)    Hyperlipidemia    Hypertension    OSA (obstructive sleep apnea)    Urine incontinence    Uterine cancer [facility])     Past Surgical History:  Procedure Laterality Date   ABDOMINAL HYSTERECTOMY  1972   APPENDECTOMY     BILIARY DILATION   03/05/2021   Procedure: BILIARY DILATION;  Surgeon: Lemar Lofty., MD;  Location: [facility] ENDOSCOPY;  Service: Gastroenterology;;   BILIARY DILATION  11/27/2021   Procedure: BILIARY DILATION;  Surgeon: Rachael Fee, MD;  Location: [facility] ENDOSCOPY;  Service: Endoscopy;;   BILIARY DILATION  02/11/2022   Procedure: BILIARY DILATION;  Surgeon: Lemar Lofty., MD;  Location: [facility] ENDOSCOPY;  Service: Gastroenterology;;   BILIARY STENT PLACEMENT  11/27/2021   Procedure: BILIARY STENT PLACEMENT;  Surgeon: Rachael Fee, MD;  Location: [facility] ENDOSCOPY;  Service: Endoscopy;;   BIOPSY  03/05/2021   Procedure: BIOPSY;  Surgeon: Lemar Lofty., MD;  Location: [facility] ENDOSCOPY;  Service: Gastroenterology;;   BIOPSY  11/27/2021   Procedure: BIOPSY;  Surgeon: Rachael Fee, MD;  Location: [facility] ENDOSCOPY;  Service: Endoscopy;;   CHOLECYSTECTOMY     ENDOSCOPIC RETROGRADE CHOLANGIOPANCREATOGRAPHY (ERCP) WITH PROPOFOL N/A 03/05/2021   Procedure: ENDOSCOPIC RETROGRADE CHOLANGIOPANCREATOGRAPHY (ERCP) WITH PROPOFOL;  Surgeon: Lemar Lofty., MD;  Location: [facility] ENDOSCOPY;  Service: Gastroenterology;  Laterality: N/A;   ENDOSCOPIC RETROGRADE CHOLANGIOPANCREATOGRAPHY (ERCP) WITH PROPOFOL N/A 11/27/2021   Procedure: ENDOSCOPIC RETROGRADE CHOLANGIOPANCREATOGRAPHY (ERCP) WITH PROPOFOL;  Surgeon: Rachael Fee, MD;  Location: [facility] ENDOSCOPY;  Service: Endoscopy;  Laterality: N/A;  possible EGD prior   ENDOSCOPIC RETROGRADE CHOLANGIOPANCREATOGRAPHY (ERCP) WITH PROPOFOL N/A 02/11/2022   Procedure: ENDOSCOPIC RETROGRADE CHOLANGIOPANCREATOGRAPHY (ERCP) WITH PROPOFOL;  Surgeon: Meridee Score Netty Starring., MD;  Location: [facility] ENDOSCOPY;  Service: Gastroenterology;  Laterality: N/A;  ESOPHAGOGASTRODUODENOSCOPY N/A 11/27/2021   Procedure: ESOPHAGOGASTRODUODENOSCOPY (EGD);  Surgeon: Rachael Fee, MD;  Location: [facility] ENDOSCOPY;  Service: Endoscopy;  Laterality: N/A;   ESOPHAGOGASTRODUODENOSCOPY (EGD) WITH  PROPOFOL N/A 03/05/2021   Procedure: ESOPHAGOGASTRODUODENOSCOPY (EGD) WITH PROPOFOL;  Surgeon: Meridee Score Netty Starring., MD;  Location: [facility] ENDOSCOPY;  Service: Gastroenterology;  Laterality: N/A;   EUS N/A 03/05/2021   Procedure: UPPER ENDOSCOPIC ULTRASOUND (EUS) RADIAL;  Surgeon: Meridee Score Netty Starring., MD;  Location: [facility] ENDOSCOPY;  Service: Gastroenterology;  Laterality: N/A;   LITHOTRIPSY  02/11/2022   Procedure: LITHOTRIPSY;  Surgeon: Mansouraty, Netty Starring., MD;  Location: [facility] ENDOSCOPY;  Service: Gastroenterology;;   LUMBAR LAMINECTOMY     REMOVAL OF STONES  03/05/2021   Procedure: REMOVAL OF STONES;  Surgeon: Lemar Lofty., MD;  Location: [facility] ENDOSCOPY;  Service: Gastroenterology;;   REMOVAL OF STONES  11/27/2021   Procedure: REMOVAL OF STONES;  Surgeon: Rachael Fee, MD;  Location: [facility] ENDOSCOPY;  Service: Endoscopy;;   REMOVAL OF STONES  02/11/2022   Procedure: REMOVAL OF STONES;  Surgeon: Lemar Lofty., MD;  Location: [facility] ENDOSCOPY;  Service: Gastroenterology;;   SPINAL CORD STIMULATOR IMPLANT     Not active any more.   SPYGLASS CHOLANGIOSCOPY N/A 03/05/2021   Procedure: SPYGLASS CHOLANGIOSCOPY;  Surgeon: Lemar Lofty., MD;  Location: [facility] ENDOSCOPY;  Service: Gastroenterology;  Laterality: N/A;   SPYGLASS CHOLANGIOSCOPY N/A 02/11/2022   Procedure: SPYGLASS CHOLANGIOSCOPY;  Surgeon: Lemar Lofty., MD;  Location: [facility] ENDOSCOPY;  Service: Gastroenterology;  Laterality: N/A;   SPYGLASS LITHOTRIPSY N/A 03/05/2021   Procedure: VHQIONGE LITHOTRIPSY;  Surgeon: Lemar Lofty., MD;  Location: [facility] ENDOSCOPY;  Service: Gastroenterology;  Laterality: N/A;   SPYGLASS LITHOTRIPSY N/A 02/11/2022   Procedure: XBMWUXLK LITHOTRIPSY;  Surgeon: Lemar Lofty., MD;  Location: [facility] ENDOSCOPY;  Service: Gastroenterology;  Laterality: N/A;   STENT REMOVAL  02/11/2022   Procedure: STENT REMOVAL;  Surgeon: Lemar Lofty., MD;  Location: [facility]  ENDOSCOPY;  Service: Gastroenterology;;   TONSILLECTOMY     TOTAL KNEE ARTHROPLASTY Right     Soc Hx - widowed after a long marriage. 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 She has several grandchildren and has one great grandchild. She is I-ADLs. Discussed with her her wishes in the event of cardiac or respiratory arrest. She clearly states, and her daughter concurs, she is DNR/DNI   reports that she quit smoking about 34 years ago. Her smoking use included cigarettes. She started smoking about 69 years ago. She has a 35 pack-year smoking history. She has never used smokeless tobacco. She reports that she does not currently use alcohol. She reports that she does not use drugs.  Allergies  Allergen Reactions   Gramicidin Other (See Comments)    Unknown reaction type   Sulfa Antibiotics Rash   Wound Dressing Adhesive Rash    Family History  Problem Relation Age of Onset   Diabetes Mother    Heart disease Father    Lung cancer Sister    Lung cancer Son    Hypertension Son    Hypertension Daughter    Diabetes Son    Hypertension Son    Diabetes Daughter    Hypertension Daughter     Prior to Admission medications   Medication Sig Start Date End Date Taking? Authorizing Provider  acetaminophen (TYLENOL) 325 MG tablet Take 352-650 mg by mouth every 6 (six) hours as needed for mild pain or moderate pain.   Yes [provider]  bumetanide (  BUMEX) 1 MG tablet Take 0.5-1 tablets (0.5-1 mg total) by mouth daily as needed (swelling). 08/06/22  Yes Seabron Spates R, DO  CREON [id number] units CPEP capsule TAKE 1 CAPSULE THREE TIMES A DAY BEFORE MEALS 05/05/23  Yes Seabron Spates R, DO  docusate sodium (COLACE) 100 MG capsule Take 1 capsule (100 mg total) by mouth 2 (two) times daily. 03/18/21  Yes Osvaldo Shipper, MD  famciclovir [facility]) 125 MG tablet TAKE 1 TABLET DAILY 03/31/23  Yes Seabron Spates R, DO  glimepiride (AMARYL) 4 MG tablet Take 1 tablet  (4 mg total) by mouth 2 (two) times daily. 07/03/23  Yes Seabron Spates R, DO  glucose blood (FREESTYLE LITE) test strip CHECK BLOOD SUGAR TWICE A DAY 11/20/22  Yes Donato Schultz, DO  JARDIANCE 25 MG TABS tablet TAKE 1 TABLET DAILY BEFORE BREAKFAST 02/04/23  Yes Seabron Spates R, DO  latanoprost (XALATAN) 0.005 % ophthalmic solution Place 1 drop into both eyes at bedtime. 08/24/21  Yes Donato Schultz, DO  linaclotide [facility]) 145 MCG CAPS capsule TAKE 1 CAPSULE DAILY BEFORE BREAKFAST 01/20/23  Yes Seabron Spates R, DO  mirtazapine (REMERON) 15 MG tablet TAKE 1 TABLET AT BEDTIME 04/16/23  Yes Seabron Spates R, DO  morphine (MS CONTIN) 30 MG 12 hr tablet Take 30 mg by mouth every 12 (twelve) hours. 01/21/22  Yes [provider]  Multiple Vitamin (MULTIVITAMIN WITH MINERALS) TABS tablet Take 1 tablet by mouth daily with supper.   Yes [provider]  omeprazole (PRILOSEC) 20 MG capsule Take 1 capsule (20 mg total) by mouth 2 (two) times daily before a meal. 08/06/22  Yes Lowne [name], Grayling Congress, DO  senna (SENOKOT) 8.6 MG TABS tablet Take 1 tablet (8.6 mg total) by mouth daily. Patient taking differently: Take 2 tablets by mouth in the morning. 11/29/21  Yes Regalado, Belkys A, MD  valsartan (DIOVAN) 40 MG tablet TAKE 1 TABLET DAILY WITH BREAKFAST 01/20/23  Yes Lowne [name], Yvonne R, DO  XARELTO 20 MG TABS tablet TAKE 1 TABLET DAILY WITH SUPPER, START ONLY AFTER THE STARTER PACK HAS BEEN COMPLETED 10/28/22  Yes Donato Schultz, [place]    Physical Exam: Vitals:   07/27/23 1630 07/27/23 1645 07/27/23 1657 07/27/23 1754  BP: (!) 155/59 (!) 147/47  (!) 157/46  Pulse: 67 (!) 57 (!) 50 69  Resp: 19 11  18   Temp:    (!) 97.4 F (36.3 C)  TempSrc:    Oral  SpO2: 100% 100%  98%  Weight:      Height:        Physical Exam Vitals and nursing note reviewed.  Constitutional:      General: She is not in acute distress.    Appearance: Normal appearance. She is  normal weight. She is not ill-appearing.  HENT:     Head: Normocephalic and atraumatic.     Nose: Nose normal.     Mouth/Throat:     Mouth: Mucous membranes are moist.     Pharynx: Oropharynx is clear.     Comments: Full dentures Eyes:     Extraocular Movements: Extraocular movements intact.     Conjunctiva/sclera: Conjunctivae normal.     Pupils: Pupils are equal, round, and reactive to light.  Neck:     Vascular: No carotid bruit.  Cardiovascular:     Rate and Rhythm: Normal rate. Rhythm irregular.     Pulses: Normal pulses.  Heart sounds: Normal heart sounds.     Comments: bigeminy Pulmonary:     Effort: Pulmonary effort is normal.     Breath sounds: Normal breath sounds.  Abdominal:     General: Bowel sounds are normal.     Palpations: Abdomen is soft.     Tenderness: There is no abdominal tenderness.  Musculoskeletal:        General: No swelling or tenderness. Normal range of motion.     Cervical back: Neck supple. No tenderness.     Right lower leg: No edema.     Left lower leg: No edema.  Lymphadenopathy:     Cervical: No cervical adenopathy.  Skin:    General: Skin is warm and dry.  Neurological:     General: No focal deficit present.     Mental Status: She is alert and oriented to person, place, and time. Mental status is at baseline.     Cranial Nerves: No cranial nerve deficit.  Psychiatric:        Mood and Affect: Mood normal.        Behavior: Behavior normal.      Labs on Admission: I have personally reviewed following labs and imaging studies  CBC: Recent Labs  Lab 07/27/23 1326  WBC 7.0  NEUTROABS 5.5  HGB 11.7*  HCT 36.9  MCV 88.5  PLT 178   Basic Metabolic Panel: Recent Labs  Lab 07/27/23 1326  NA 139  K 4.1  CL 110  CO2 22  GLUCOSE 173*  BUN 26*  CREATININE 0.92  CALCIUM 9.2   GFR: Estimated Creatinine Clearance: 36.3 mL/min (by C-G formula based on SCr of 0.92 mg/dL). Liver Function Tests: Recent Labs  Lab 07/27/23 1326   AST 16  ALT 21  ALKPHOS 71  BILITOT 0.4  PROT 6.5  ALBUMIN 3.3*   No results for input(s): "LIPASE", "AMYLASE" in the last 168 hours. No results for input(s): "AMMONIA" in the last 168 hours. Coagulation Profile: No results for input(s): "INR", "PROTIME" in the last 168 hours. Cardiac Enzymes: No results for input(s): "CKTOTAL", "CKMB", "CKMBINDEX", "TROPONINI" in the last 168 hours. BNP (last 3 results) No results for input(s): "PROBNP" in the last 8760 hours. HbA1C: No results for input(s): "HGBA1C" in the last 72 hours. CBG: No results for input(s): "GLUCAP" in the last 168 hours. Lipid Profile: No results for input(s): "CHOL", "HDL", "LDLCALC", "TRIG", "CHOLHDL", "LDLDIRECT" in the last 72 hours. Thyroid Function Tests: No results for input(s): "TSH", "T4TOTAL", "FREET4", "T3FREE", "THYROIDAB" in the last 72 hours. Anemia Panel: No results for input(s): "VITAMINB12", "FOLATE", "FERRITIN", "TIBC", "IRON", "RETICCTPCT" in the last 72 hours. Urine analysis:    Component Value Date/Time   COLORURINE YELLOW 07/27/2023 1326   APPEARANCEUR CLEAR 07/27/2023 1326   LABSPEC 1.020 07/27/2023 1326   PHURINE 5.5 07/27/2023 1326   GLUCOSEU >=500 (A) 07/27/2023 1326   HGBUR NEGATIVE 07/27/2023 1326   BILIRUBINUR NEGATIVE 07/27/2023 1326   BILIRUBINUR negative 10/19/2020 1440   KETONESUR NEGATIVE 07/27/2023 1326   PROTEINUR 30 (A) 07/27/2023 1326   UROBILINOGEN 0.2 10/19/2020 1440   NITRITE NEGATIVE 07/27/2023 1326   LEUKOCYTESUR NEGATIVE 07/27/2023 1326    Radiological Exams on Admission: I have personally reviewed images CT Head Wo Contrast  Result Date: 07/27/2023 CLINICAL DATA:  Bradycardia EXAM: CT HEAD WITHOUT CONTRAST TECHNIQUE: Contiguous axial images were obtained from the base of the skull through the vertex without intravenous contrast. RADIATION DOSE REDUCTION: This exam was performed according to the departmental dose-optimization  program which includes automated  exposure control, adjustment of the mA and/or kV according to patient size and/or use of iterative reconstruction technique. COMPARISON:  None Available. FINDINGS: Brain: No acute intracranial hemorrhage. No focal mass lesion. No CT evidence of acute infarction. No midline shift or mass effect. No hydrocephalus. Basilar cisterns are patent. There are periventricular and subcortical white matter hypodensities. Generalized cortical atrophy. Vascular: No hyperdense vessel or unexpected calcification. Skull: Normal. Negative for fracture or focal lesion. Sinuses/Orbits: Paranasal sinuses and mastoid air cells are clear. Orbits are clear. Other: None. IMPRESSION: 1. No acute intracranial findings. 2. Atrophy and white matter microvascular disease. Electronically Signed   By: Genevive Bi M.D.   On: 07/27/2023 13:35   DG Chest 2 View  Result Date: 07/27/2023 CLINICAL DATA:  Fatigue.  Weakness.  Bradycardia. EXAM: CHEST - 2 VIEW COMPARISON:  05/01/2023 FINDINGS: The heart size and mediastinal contours are within normal limits. Stable linear opacity in left lung base, consistent with scarring. No evidence of acute infiltrate or edema. Thoracic spine neurostimulator leads noted. IMPRESSION: No active cardiopulmonary disease. Electronically Signed   By: Danae Orleans M.D.   On: 07/27/2023 12:58    EKG: I have personally reviewed EKG: Ventricular bigeminy, underlying vertricular rate of approximately 60  Assessment/Plan Principal Problem:   Symptomatic sinus bradycardia Active Problems:   Gastroesophageal reflux disease with esophagitis without hemorrhage   Hypertension   Diabetes mellitus without complication (HCC)   Hyperlipidemia    Assessment and Plan: * Symptomatic sinus bradycardia Patient with documented bigeminy with underlying bradycardia. This was addressed in May '24. She presented now with increased fatigue and dizziness. At admission exam underlying rhythm approximately 60  Plan Per  Cardiology recommendation - 2D echo ordered  Will continue home medications  Cardiology to see 07/28/23  Hyperlipidemia Last lipid panel 10/02/22  total cholesterol 135, HDL 71, LDL 57  Plan Continue home regimen  Diabetes mellitus without complication (HCC) Patient well controlled for her age with last A1C 7.3% 05/05/23  Plan Continue home regimen  Ss coverage  Hypertension BP adequately controlled on present regimen  Plan Continue home medication  Gastroesophageal reflux disease with esophagitis without hemorrhage No active complaints.   Plan Continue PPI       DVT prophylaxis: Xarelto Code Status: DNR/DNI(Do NOT Intubate) Family Communication: daughter present during interview and exam. Agrees with tx plan  Disposition Plan: home when medically stable - anticipate 24-48 hrs  Consults called: cardiology - Dr. Servando Salina  Admission status: Observation, Telemetry bed   Illene Regulus, MD [facility] 07/27/2023, 7:08 PM

## 2023-07-27 NOTE — Assessment & Plan Note (Signed)
Patient with documented bigeminy with underlying bradycardia. This was addressed in May '24. She presented now with increased fatigue and dizziness. At admission exam underlying rhythm approximately 60  Plan Per Cardiology recommendation - 2D echo ordered  Will continue home medications  Cardiology to see 07/28/23

## 2023-07-28 ENCOUNTER — Encounter (HOSPITAL_COMMUNITY): Payer: Self-pay | Admitting: Internal Medicine

## 2023-07-28 ENCOUNTER — Inpatient Hospital Stay (HOSPITAL_COMMUNITY): Payer: Medicare Other

## 2023-07-28 DIAGNOSIS — I452 Bifascicular block: Secondary | ICD-10-CM

## 2023-07-28 DIAGNOSIS — E785 Hyperlipidemia, unspecified: Secondary | ICD-10-CM

## 2023-07-28 DIAGNOSIS — I10 Essential (primary) hypertension: Secondary | ICD-10-CM

## 2023-07-28 DIAGNOSIS — I493 Ventricular premature depolarization: Secondary | ICD-10-CM

## 2023-07-28 DIAGNOSIS — I5031 Acute diastolic (congestive) heart failure: Secondary | ICD-10-CM

## 2023-07-28 DIAGNOSIS — E119 Type 2 diabetes mellitus without complications: Secondary | ICD-10-CM

## 2023-07-28 DIAGNOSIS — R001 Bradycardia, unspecified: Secondary | ICD-10-CM | POA: Diagnosis not present

## 2023-07-28 LAB — GLUCOSE, CAPILLARY
Glucose-Capillary: 76 mg/dL (ref 70–99)
Glucose-Capillary: 121 mg/dL — ABNORMAL HIGH (ref 70–99)
Glucose-Capillary: 130 mg/dL — ABNORMAL HIGH (ref 70–99)
Glucose-Capillary: 149 mg/dL — ABNORMAL HIGH (ref 70–99)

## 2023-07-28 NOTE — Progress Notes (Signed)
PROGRESS NOTE  Sherry Holloway WUJ:[id number] DOB: [birth year] DOA: 07/27/2023 PCP: Donato Schultz, DO   LOS: 1 day   Brief Narrative / Interim history: 89 y/o with medical h/o DM, HTN, HLD, GERD, chronic pain,, h/o PE  CHF was seen for bradycardia by Dr. Alex Gardener May '84. She has had increased fatigue and dizziness and presented to [facility] for evaluation. She was found to be bradycardic. EDP consulted Dr. Servando Salina, [facility], who recommended directed admission to cardiac telemetry for further evaluation   Subjective / 24h Interval events: Continues to feel weak and fatigued this morning.  Assesement and Plan: Principal Problem:   Symptomatic sinus bradycardia Active Problems:   Gastroesophageal reflux disease with esophagitis without hemorrhage   Hypertension   Diabetes mellitus without complication (HCC)   Hyperlipidemia   Principal problem Symptomatic sinus bradycardia, PVCs-cardiology consulted, appreciate input.  2D echo is pending.  Continues to remain fatigued and weak this morning. -PT evaluation pending  Active problems Chronic pain-continue home regimen  Essential hypertension-continue home medications  History of PE-on Xarelto  History of esophageal cancer-follows with oncology as an outpatient, clinically in remission  DM2 -A1c 7.3 which represents adequate control in her age group.  Continue sliding scale  Lab Results  Component Value Date   HGBA1C 7.3 (H) 05/05/2023   CBG (last 3)  Recent Labs    07/27/23 2136 07/28/23 0730  GLUCAP 139* 76    Scheduled Meds:  docusate sodium  100 mg Oral BID   empagliflozin  25 mg Oral QAC breakfast   glimepiride  4 mg Oral BID WC   insulin aspart  0-15 Units Subcutaneous TID WC   irbesartan  75 mg Oral Daily   latanoprost  1 drop Both Eyes QHS   linaclotide  145 mcg Oral QAC breakfast   lipase/protease/amylase  36,000 Units Oral TID AC   mirtazapine  15 mg Oral QHS   morphine  30 mg Oral Q12H   pantoprazole   40 mg Oral Daily   rivaroxaban  20 mg Oral Q supper   senna  2 tablet Oral q AM   sodium chloride flush  3 mL Intravenous Q12H   valACYclovir  250 mg Oral Daily   Continuous Infusions:  sodium chloride     PRN Meds:.sodium chloride, acetaminophen **OR** acetaminophen, bumetanide, mouth rinse, sodium chloride flush, zolpidem  Current Outpatient Medications  Medication Instructions   acetaminophen (TYLENOL) 352-650 mg, Oral, Every 6 hours PRN   bumetanide (BUMEX) 0.5-1 mg, Oral, Daily PRN   CREON 36000-114000 units CPEP capsule TAKE 1 CAPSULE THREE TIMES A DAY BEFORE MEALS   docusate sodium (COLACE) 100 mg, Oral, 2 times daily   famciclovir (FAMVIR) 125 mg, Oral, Daily   glimepiride (AMARYL) 4 mg, Oral, 2 times daily   glucose blood (FREESTYLE LITE) test strip CHECK BLOOD SUGAR TWICE A DAY   Jardiance 25 mg, Oral, Daily before breakfast   latanoprost (XALATAN) 0.005 % ophthalmic solution 1 drop, Both Eyes, Daily at bedtime   linaclotide (LINZESS) 145 MCG CAPS capsule TAKE 1 CAPSULE DAILY BEFORE BREAKFAST   mirtazapine (REMERON) 15 mg, Oral, Daily at bedtime   morphine (MS CONTIN) 30 mg, Oral, Every 12 hours   Multiple Vitamin (MULTIVITAMIN WITH MINERALS) TABS tablet 1 tablet, Oral, Daily with supper   omeprazole (PRILOSEC) 20 mg, Oral, 2 times daily before meals   senna (SENOKOT) 8.6 mg, Oral, Daily   valsartan (DIOVAN) 40 mg, Oral, Daily with breakfast   XARELTO 20 MG TABS  tablet TAKE 1 TABLET DAILY WITH SUPPER, START ONLY AFTER THE STARTER PACK HAS BEEN COMPLETED    Diet Orders (From admission, onward)     Start     Ordered   07/27/23 1902  Diet heart healthy/carb modified Room service appropriate? Yes with Assist; Fluid consistency: Thin  Diet effective now       Question Answer Comment  Diet-HS Snack? Nothing   Room service appropriate? Yes with Assist   Fluid consistency: Thin      07/27/23 1903            DVT prophylaxis:  rivaroxaban (XARELTO) tablet 20 mg    Lab Results  Component Value Date   PLT 178 07/27/2023      Code Status: DNR  Family Communication: daughter at bedside   Status is: Inpatient Remains inpatient appropriate because: persistent symtpms  Level of care: Telemetry Cardiac  Consultants:  Cardiology   Objective: Vitals:   07/27/23 1754 07/27/23 2030 07/28/23 0003 07/28/23 0334  BP: (!) 157/46 (!) 120/55 (!) 143/47 (!) 137/41  Pulse: 69 67  (!) 51  Resp: 18 18 18 20   Temp: (!) 97.4 F (36.3 C) 98 F (36.7 C) 98.2 F (36.8 C) 97.7 F (36.5 C)  TempSrc: Oral Oral Oral Oral  SpO2: 98% 96% 97% 98%  Weight:      Height:       No intake or output data in the 24 hours ending 07/28/23 1128 Wt Readings from Last 3 Encounters:  07/27/23 68.5 kg  05/05/23 68.8 kg  05/01/23 70.6 kg    Examination:  Constitutional: NAD Eyes: no scleral icterus ENMT: Mucous membranes are moist.  Neck: normal, supple Respiratory: clear to auscultation bilaterally, no wheezing, no crackles. Normal respiratory effort. No accessory muscle use.  Cardiovascular: Regular rate and rhythm, no murmurs / rubs / gallops. No LE edema. Bradycardic Abdomen: non distended, no tenderness. Bowel sounds positive.  Musculoskeletal: no clubbing / cyanosis.  Data Reviewed: I have independently reviewed following labs and imaging studies   CBC Recent Labs  Lab 07/27/23 1326  WBC 7.0  HGB 11.7*  HCT 36.9  PLT 178  MCV 88.5  MCH 28.1  MCHC 31.7  RDW 14.5  LYMPHSABS 1.1  MONOABS 0.4  EOSABS 0.1  BASOSABS 0.0    Recent Labs  Lab 07/27/23 1326  NA 139  K 4.1  CL 110  CO2 22  GLUCOSE 173*  BUN 26*  CREATININE 0.92  CALCIUM 9.2  AST 16  ALT 21  ALKPHOS 71  BILITOT 0.4  ALBUMIN 3.3*  TSH 2.056  BNP 334.3*    ------------------------------------------------------------------------------------------------------------------ No results for input(s): "CHOL", "HDL", "LDLCALC", "TRIG", "CHOLHDL", "LDLDIRECT" in the last 72  hours.  Lab Results  Component Value Date   HGBA1C 7.3 (H) 05/05/2023   ------------------------------------------------------------------------------------------------------------------ Recent Labs    07/27/23 1326  TSH 2.056    Cardiac Enzymes No results for input(s): "CKMB", "TROPONINI", "MYOGLOBIN" in the last 168 hours.  Invalid input(s): "CK" ------------------------------------------------------------------------------------------------------------------    Component Value Date/Time   BNP 334.3 (H) 07/27/2023 1326    CBG: Recent Labs  Lab 07/27/23 2136 07/28/23 0730  GLUCAP 139* 76    No results found for this or any previous visit (from the past 240 hour(s)).   Radiology Studies: CT Head Wo Contrast  Result Date: 07/27/2023 CLINICAL DATA:  Bradycardia EXAM: CT HEAD WITHOUT CONTRAST TECHNIQUE: Contiguous axial images were obtained from the base of the skull through the vertex without intravenous contrast. RADIATION  DOSE REDUCTION: This exam was performed according to the departmental dose-optimization program which includes automated exposure control, adjustment of the mA and/or kV according to patient size and/or use of iterative reconstruction technique. COMPARISON:  None Available. FINDINGS: Brain: No acute intracranial hemorrhage. No focal mass lesion. No CT evidence of acute infarction. No midline shift or mass effect. No hydrocephalus. Basilar cisterns are patent. There are periventricular and subcortical white matter hypodensities. Generalized cortical atrophy. Vascular: No hyperdense vessel or unexpected calcification. Skull: Normal. Negative for fracture or focal lesion. Sinuses/Orbits: Paranasal sinuses and mastoid air cells are clear. Orbits are clear. Other: None. IMPRESSION: 1. No acute intracranial findings. 2. Atrophy and white matter microvascular disease. Electronically Signed   By: Genevive Bi M.D.   On: 07/27/2023 13:35   DG Chest 2 View  Result  Date: 07/27/2023 CLINICAL DATA:  Fatigue.  Weakness.  Bradycardia. EXAM: CHEST - 2 VIEW COMPARISON:  05/01/2023 FINDINGS: The heart size and mediastinal contours are within normal limits. Stable linear opacity in left lung base, consistent with scarring. No evidence of acute infiltrate or edema. Thoracic spine neurostimulator leads noted. IMPRESSION: No active cardiopulmonary disease. Electronically Signed   By: Danae Orleans M.D.   On: 07/27/2023 12:58     Pamella Pert, MD, PhD [facility]  Between 7 am - 7 pm I am available, please contact me via Amion (for emergencies) or Securechat (non urgent messages)  Between 7 pm - 7 am I am not available, please contact night coverage MD/APP via Amion

## 2023-07-28 NOTE — Consult Note (Addendum)
Cardiology Consultation   Patient ID: Sherry Holloway MRN: [id number]; DOB: 17-Jun-1928  Admit date: 07/27/2023 Date of Consult: 07/28/2023  PCP:  Donato Schultz, DO   [facility] Providers Cardiologist:  Lance Muss, MD        Patient Profile:   Sherry Holloway is a 89 y.o. female with a hx of DM2, HTN, HLD GERD, chronic pain, hx PE, CHF and hx of bradycardia from 04/2023, RBBB and LAFB who is being seen 07/28/2023 for the evaluation of bradycardia and fatigue at the request of Dr. Elvera Lennox.  History of Present Illness:   Sherry Holloway was seen in 04/2023 by Dr. Eldridge Dace for bradycardia and n no rate slowing meds.  She has RBBB and LAFB. She had been seen in ER with brady due and dizziness.  She was in bigeminy PVCs.which may seem bradycardic.  She wore a monitor and SR with freq PVCs representing 11% of all beats.  No VT.  Rare PACs.    And rare 4-5 beats of ectopic atrial tachy.  Patient had a min HR of 47 bpm, max HR of 158 bpm, and avg HR of 64 bpm.  Echo 03/2021 with EF 60-65%, mild LVH G1DD  Pt presented to ER with slow HR. In the 68s and some with Bigeminy PVCs.  Her daughter reports her mentation is slower than usual.   She has increased fatigue and dizziness.  She is usually up and about but could not make herself do anything.  + lightheadedness.  She was admitted by [facility].  BP on arrival 155/49.     Na 139 K+ 4.1 BUN 26 Cr 0.92 albumin 3.3 BNP 334 Hs troponin 9 and 8 Hgb 11.7 WBC 7 plts 178  TSH 2.056 U/A + glucose  EKG:  The EKG was personally reviewed and demonstrates:  SR with Bigeminy PVCs, RBBB and LAFB   Telemetry:  Telemetry was personally reviewed and demonstrates:  SB in the 50s then at times freq PVCs.    CT head no acute intracranial findings, Atrophy and white matter microvascular disease.  2V CXR  NAD  BP 153/54 P 76  to 54 afebrile    She never started lopressor that is om telephone note.   Past Medical History:  Diagnosis Date    Arthritis    Choledocholithiasis    Diabetes mellitus without complication (HCC)    Diverticulitis    Esophageal cancer (HCC) dx'd 2020   GERD (gastroesophageal reflux disease)    Hyperlipidemia    Hypertension    OSA (obstructive sleep apnea)    Urine incontinence    Uterine cancer [facility])     Past Surgical History:  Procedure Laterality Date   ABDOMINAL HYSTERECTOMY  1972   APPENDECTOMY     BILIARY DILATION  03/05/2021   Procedure: BILIARY DILATION;  Surgeon: Lemar Lofty., MD;  Location: [facility] ENDOSCOPY;  Service: Gastroenterology;;   BILIARY DILATION  11/27/2021   Procedure: BILIARY DILATION;  Surgeon: Rachael Fee, MD;  Location: [facility] ENDOSCOPY;  Service: Endoscopy;;   BILIARY DILATION  02/11/2022   Procedure: BILIARY DILATION;  Surgeon: Lemar Lofty., MD;  Location: [facility] ENDOSCOPY;  Service: Gastroenterology;;   BILIARY STENT PLACEMENT  11/27/2021   Procedure: BILIARY STENT PLACEMENT;  Surgeon: Rachael Fee, MD;  Location: [facility] ENDOSCOPY;  Service: Endoscopy;;   BIOPSY  03/05/2021   Procedure: BIOPSY;  Surgeon: Lemar Lofty., MD;  Location: [facility] ENDOSCOPY;  Service: Gastroenterology;;   BIOPSY  11/27/2021  Procedure: BIOPSY;  Surgeon: Rachael Fee, MD;  Location: [facility] ENDOSCOPY;  Service: Endoscopy;;   CHOLECYSTECTOMY     ENDOSCOPIC RETROGRADE CHOLANGIOPANCREATOGRAPHY (ERCP) WITH PROPOFOL N/A 03/05/2021   Procedure: ENDOSCOPIC RETROGRADE CHOLANGIOPANCREATOGRAPHY (ERCP) WITH PROPOFOL;  Surgeon: Lemar Lofty., MD;  Location: [facility] ENDOSCOPY;  Service: Gastroenterology;  Laterality: N/A;   ENDOSCOPIC RETROGRADE CHOLANGIOPANCREATOGRAPHY (ERCP) WITH PROPOFOL N/A 11/27/2021   Procedure: ENDOSCOPIC RETROGRADE CHOLANGIOPANCREATOGRAPHY (ERCP) WITH PROPOFOL;  Surgeon: Rachael Fee, MD;  Location: [facility] ENDOSCOPY;  Service: Endoscopy;  Laterality: N/A;  possible EGD prior   ENDOSCOPIC RETROGRADE CHOLANGIOPANCREATOGRAPHY (ERCP) WITH PROPOFOL N/A  02/11/2022   Procedure: ENDOSCOPIC RETROGRADE CHOLANGIOPANCREATOGRAPHY (ERCP) WITH PROPOFOL;  Surgeon: Meridee Score Netty Starring., MD;  Location: [facility] ENDOSCOPY;  Service: Gastroenterology;  Laterality: N/A;   ESOPHAGOGASTRODUODENOSCOPY N/A 11/27/2021   Procedure: ESOPHAGOGASTRODUODENOSCOPY (EGD);  Surgeon: Rachael Fee, MD;  Location: [facility] ENDOSCOPY;  Service: Endoscopy;  Laterality: N/A;   ESOPHAGOGASTRODUODENOSCOPY (EGD) WITH PROPOFOL N/A 03/05/2021   Procedure: ESOPHAGOGASTRODUODENOSCOPY (EGD) WITH PROPOFOL;  Surgeon: Meridee Score Netty Starring., MD;  Location: [facility] ENDOSCOPY;  Service: Gastroenterology;  Laterality: N/A;   EUS N/A 03/05/2021   Procedure: UPPER ENDOSCOPIC ULTRASOUND (EUS) RADIAL;  Surgeon: Meridee Score Netty Starring., MD;  Location: [facility] ENDOSCOPY;  Service: Gastroenterology;  Laterality: N/A;   LITHOTRIPSY  02/11/2022   Procedure: LITHOTRIPSY;  Surgeon: Mansouraty, Netty Starring., MD;  Location: [facility] ENDOSCOPY;  Service: Gastroenterology;;   LUMBAR LAMINECTOMY     REMOVAL OF STONES  03/05/2021   Procedure: REMOVAL OF STONES;  Surgeon: Lemar Lofty., MD;  Location: [facility] ENDOSCOPY;  Service: Gastroenterology;;   REMOVAL OF STONES  11/27/2021   Procedure: REMOVAL OF STONES;  Surgeon: Rachael Fee, MD;  Location: [facility] ENDOSCOPY;  Service: Endoscopy;;   REMOVAL OF STONES  02/11/2022   Procedure: REMOVAL OF STONES;  Surgeon: Lemar Lofty., MD;  Location: [facility] ENDOSCOPY;  Service: Gastroenterology;;   SPINAL CORD STIMULATOR IMPLANT     Not active any more.   SPYGLASS CHOLANGIOSCOPY N/A 03/05/2021   Procedure: SPYGLASS CHOLANGIOSCOPY;  Surgeon: Lemar Lofty., MD;  Location: [facility] ENDOSCOPY;  Service: Gastroenterology;  Laterality: N/A;   SPYGLASS CHOLANGIOSCOPY N/A 02/11/2022   Procedure: SPYGLASS CHOLANGIOSCOPY;  Surgeon: Lemar Lofty., MD;  Location: [facility] ENDOSCOPY;  Service: Gastroenterology;  Laterality: N/A;   SPYGLASS LITHOTRIPSY N/A 03/05/2021   Procedure:  WRUEAVWU LITHOTRIPSY;  Surgeon: Lemar Lofty., MD;  Location: [facility] ENDOSCOPY;  Service: Gastroenterology;  Laterality: N/A;   SPYGLASS LITHOTRIPSY N/A 02/11/2022   Procedure: [id number] LITHOTRIPSY;  Surgeon: Lemar Lofty., MD;  Location: [facility] ENDOSCOPY;  Service: Gastroenterology;  Laterality: N/A;   STENT REMOVAL  02/11/2022   Procedure: STENT REMOVAL;  Surgeon: Lemar Lofty., MD;  Location: [facility] ENDOSCOPY;  Service: Gastroenterology;;   TONSILLECTOMY     TOTAL KNEE ARTHROPLASTY Right      Home Medications:  Prior to Admission medications   Medication Sig Start Date End Date Taking? Authorizing Provider  acetaminophen (TYLENOL) 325 MG tablet Take 352-650 mg by mouth every 6 (six) hours as needed for mild pain or moderate pain.   Yes [provider]  bumetanide (BUMEX) 1 MG tablet Take 0.5-1 tablets (0.5-1 mg total) by mouth daily as needed (swelling). 08/06/22  Yes Seabron Spates R, DO  CREON [id number] units CPEP capsule TAKE 1 CAPSULE THREE TIMES A DAY BEFORE MEALS 05/05/23  Yes Seabron Spates R, DO  docusate sodium (COLACE) 100 MG capsule Take 1 capsule (100 mg total) by mouth 2 (two) times daily.  03/18/21  Yes Osvaldo Shipper, MD  famciclovir [facility]) 125 MG tablet TAKE 1 TABLET DAILY 03/31/23  Yes Seabron Spates R, DO  glimepiride (AMARYL) 4 MG tablet Take 1 tablet (4 mg total) by mouth 2 (two) times daily. 07/03/23  Yes Seabron Spates R, DO  glucose blood (FREESTYLE LITE) test strip CHECK BLOOD SUGAR TWICE A DAY 11/20/22  Yes Donato Schultz, DO  JARDIANCE 25 MG TABS tablet TAKE 1 TABLET DAILY BEFORE BREAKFAST 02/04/23  Yes Seabron Spates R, DO  latanoprost (XALATAN) 0.005 % ophthalmic solution Place 1 drop into both eyes at bedtime. 08/24/21  Yes Donato Schultz, DO  linaclotide [facility]) 145 MCG CAPS capsule TAKE 1 CAPSULE DAILY BEFORE BREAKFAST 01/20/23  Yes Seabron Spates R, DO  mirtazapine (REMERON) 15 MG tablet TAKE 1  TABLET AT BEDTIME 04/16/23  Yes Seabron Spates R, DO  morphine (MS CONTIN) 30 MG 12 hr tablet Take 30 mg by mouth every 12 (twelve) hours. 01/21/22  Yes [provider]  Multiple Vitamin (MULTIVITAMIN WITH MINERALS) TABS tablet Take 1 tablet by mouth daily with supper.   Yes [provider]  omeprazole (PRILOSEC) 20 MG capsule Take 1 capsule (20 mg total) by mouth 2 (two) times daily before a meal. 08/06/22  Yes Lowne [name], Grayling Congress, DO  senna (SENOKOT) 8.6 MG TABS tablet Take 1 tablet (8.6 mg total) by mouth daily. Patient taking differently: Take 2 tablets by mouth in the morning. 11/29/21  Yes Regalado, Belkys A, MD  valsartan (DIOVAN) 40 MG tablet TAKE 1 TABLET DAILY WITH BREAKFAST 01/20/23  Yes Lowne [name], Yvonne R, DO  XARELTO 20 MG TABS tablet TAKE 1 TABLET DAILY WITH SUPPER, START ONLY AFTER THE STARTER PACK HAS BEEN COMPLETED 10/28/22  Yes Donato Schultz, DO    Inpatient Medications: Scheduled Meds:  docusate sodium  100 mg Oral BID   empagliflozin  25 mg Oral QAC breakfast   glimepiride  4 mg Oral BID WC   insulin aspart  0-15 Units Subcutaneous TID WC   irbesartan  75 mg Oral Daily   latanoprost  1 drop Both Eyes QHS   linaclotide  145 mcg Oral QAC breakfast   lipase/protease/amylase  36,000 Units Oral TID AC   mirtazapine  15 mg Oral QHS   morphine  30 mg Oral Q12H   pantoprazole  40 mg Oral Daily   rivaroxaban  20 mg Oral Q supper   senna  2 tablet Oral q AM   sodium chloride flush  3 mL Intravenous Q12H   valACYclovir  250 mg Oral Daily   Continuous Infusions:  sodium chloride     PRN Meds: sodium chloride, acetaminophen **OR** acetaminophen, bumetanide, mouth rinse, sodium chloride flush, zolpidem  Allergies:    Allergies  Allergen Reactions   Gramicidin Other (See Comments)    Unknown reaction type   Sulfa Antibiotics Rash   Wound Dressing Adhesive Rash    Social History:   Social History   Socioeconomic History   Marital status:  Widowed    Spouse name: Not on file   Number of children: 4   Years of education: Not on file   Highest education level: Not on file  Occupational History   Occupation: Retired Lawyer  Tobacco Use   Smoking status: Former    Current packs/day: 0.00    Average packs/day: 1 pack/day for 35.0 years (35.0 ttl pk-yrs)    Types: Cigarettes  Start date: 01/17/1954    Quit date: 01/17/1989    Years since quitting: 34.5   Smokeless tobacco: Never  Vaping Use   Vaping status: Never Used  Substance and Sexual Activity   Alcohol use: Not Currently   Drug use: Never   Sexual activity: Not Currently  Other Topics Concern   Not on file  Social History Narrative   Just moved from [place]--- and moved into an apt 2.4 miles from her daughter    Social Determinants of Health   Financial Resource Strain: Low Risk  (04/02/2022)   Overall Financial Resource Strain (CARDIA)    Difficulty of Paying Living Expenses: Not hard at all  Food Insecurity: No Food Insecurity (07/27/2023)   Hunger Vital Sign    Worried About Running Out of Food in the Last Year: Never true    Ran Out of Food in the Last Year: Never true  Transportation Needs: No Transportation Needs (07/27/2023)   PRAPARE - Administrator, Civil Service (Medical): No    Lack of Transportation (Non-Medical): No  Physical Activity: Sufficiently Active (04/02/2022)   Exercise Vital Sign    Days of Exercise per Week: 5 days    Minutes of Exercise per Session: 60 min  Stress: No Stress Concern Present (04/02/2022)   [employer] of Occupational Health - Occupational Stress Questionnaire    Feeling of Stress : Not at all  Social Connections: Moderately Isolated (04/02/2022)   Social Connection and Isolation Panel [NHANES]    Frequency of Communication with Friends and Family: More than three times a week    Frequency of Social Gatherings with Friends and Family: Three times a week    Attends Religious Services: More than 4 times per year     Active Member of Clubs or Organizations: No    Attends Banker Meetings: Never    Marital Status: Widowed  Intimate Partner Violence: Not At Risk (07/27/2023)   Humiliation, Afraid, Rape, and Kick questionnaire    Fear of Current or Ex-Partner: No    Emotionally Abused: No    Physically Abused: No    Sexually Abused: No    Family History:    Family History  Problem Relation Age of Onset   Diabetes Mother    Heart disease Father    Lung cancer Sister    Lung cancer Son    Hypertension Son    Hypertension Daughter    Diabetes Son    Hypertension Son    Diabetes Daughter    Hypertension Daughter      ROS:  Please see the history of present illness.  General:no colds or fevers, no weight changes Skin:no rashes or ulcers HEENT:no blurred vision, no congestion CV:see HPI PUL:see HPI GI:no diarrhea constipation or melena, no indigestion GU:no hematuria, no dysuria MS:no joint pain, no claudication Neuro:no syncope, no lightheadedness Endo:+ diabetes, no thyroid disease  All other ROS reviewed and negative.     Physical Exam/Data:   Vitals:   07/27/23 1754 07/27/23 2030 07/28/23 0003 07/28/23 0334  BP: (!) 157/46 (!) 120/55 (!) 143/47 (!) 137/41  Pulse: 69 67  (!) 51  Resp: 18 18 18 20   Temp: (!) 97.4 F (36.3 C) 98 F (36.7 C) 98.2 F (36.8 C) 97.7 F (36.5 C)  TempSrc: Oral Oral Oral Oral  SpO2: 98% 96% 97% 98%  Weight:      Height:       No intake or output data in the 24 hours  ending 07/28/23 1348    07/27/2023   11:55 AM 05/05/2023    8:33 AM 05/01/2023    2:23 PM  Last 3 Weights  Weight (lbs) 151 lb 151 lb 9.6 oz 155 lb 9.6 oz  Weight (kg) 68.493 kg 68.765 kg 70.58 kg     Body mass index is 25.92 kg/m.  General:  Well nourished, well developed, in no acute distress HEENT: normal Neck: no JVD Vascular: No carotid bruits; Distal pulses 2+ bilaterally Cardiac:  normal S1, S2; RRR; no murmur  Lungs:  clear to auscultation bilaterally,  no wheezing, rhonchi or rales  Abd: soft, nontender, no hepatomegaly  Ext: no edema Musculoskeletal:  No deformities, BUE and BLE strength normal and equal Skin: warm and dry  Neuro:  alert and oriented  X 3 MAE follows commands, no focal abnormalities noted Psych:  Normal affect     Relevant CV Studies: Echo pending   Outpt monitoring for 3 days   Echo 03/17/21 IMPRESSIONS     1. Left ventricular ejection fraction, by estimation, is 60 to 65%. The  left ventricle has normal function. The left ventricle has no regional  wall motion abnormalities. There is mild left ventricular hypertrophy.  Left ventricular diastolic parameters  are consistent with Grade I diastolic dysfunction (impaired relaxation).   2. Right ventricular systolic function is normal. The right ventricular  size is normal.   3. The mitral valve is normal in structure. No evidence of mitral valve  regurgitation. No evidence of mitral stenosis.   4. The aortic valve is tricuspid. Aortic valve regurgitation is not  visualized. Mild aortic valve sclerosis is present, with no evidence of  aortic valve stenosis.   5. The inferior vena cava is normal in size with greater than 50%  respiratory variability, suggesting right atrial pressure of 3 mmHg.   FINDINGS   Left Ventricle: Left ventricular ejection fraction, by estimation, is 60  to 65%. The left ventricle has normal function. The left ventricle has no  regional wall motion abnormalities. The left ventricular internal cavity  size was normal in size. There is   mild left ventricular hypertrophy. Left ventricular diastolic parameters  are consistent with Grade I diastolic dysfunction (impaired relaxation).   Right Ventricle: The right ventricular size is normal. No increase in  right ventricular wall thickness. Right ventricular systolic function is  normal.   Left Atrium: Left atrial size was normal in size.   Right Atrium: Right atrial size was normal in  size.   Pericardium: There is no evidence of pericardial effusion.   Mitral Valve: The mitral valve is normal in structure. Mild mitral annular  calcification. No evidence of mitral valve regurgitation. No evidence of  mitral valve stenosis.   Tricuspid Valve: The tricuspid valve is normal in structure. Tricuspid  valve regurgitation is trivial. No evidence of tricuspid stenosis.   Aortic Valve: The aortic valve is tricuspid. Aortic valve regurgitation is  not visualized. Mild aortic valve sclerosis is present, with no evidence  of aortic valve stenosis.   Pulmonic Valve: The pulmonic valve was normal in structure. Pulmonic valve  regurgitation is not visualized. No evidence of pulmonic stenosis.   Aorta: The aortic root is normal in size and structure.   Venous: The inferior vena cava is normal in size with greater than 50%  respiratory variability, suggesting right atrial pressure of 3 mmHg.   IAS/Shunts: No atrial level shunt detected by color flow Doppler.   Laboratory Data:  High Sensitivity  Troponin:   Recent Labs  Lab 07/27/23 1326 07/27/23 1532  TROPONINIHS 9 8     Chemistry Recent Labs  Lab 07/27/23 1326  NA 139  K 4.1  CL 110  CO2 22  GLUCOSE 173*  BUN 26*  CREATININE 0.92  CALCIUM 9.2  GFRNONAA 58*  ANIONGAP 7    Recent Labs  Lab 07/27/23 1326  PROT 6.5  ALBUMIN 3.3*  AST 16  ALT 21  ALKPHOS 71  BILITOT 0.4   Lipids No results for input(s): "CHOL", "TRIG", "HDL", "LABVLDL", "LDLCALC", "CHOLHDL" in the last 168 hours.  Hematology Recent Labs  Lab 07/27/23 1326  WBC 7.0  RBC 4.17  HGB 11.7*  HCT 36.9  MCV 88.5  MCH 28.1  MCHC 31.7  RDW 14.5  PLT 178   Thyroid  Recent Labs  Lab 07/27/23 1326  TSH 2.056    BNP Recent Labs  Lab 07/27/23 1326  BNP 334.3*    DDimer No results for input(s): "DDIMER" in the last 168 hours.   Radiology/Studies:  CT Head Wo Contrast  Result Date: 07/27/2023 CLINICAL DATA:  Bradycardia EXAM:  CT HEAD WITHOUT CONTRAST TECHNIQUE: Contiguous axial images were obtained from the base of the skull through the vertex without intravenous contrast. RADIATION DOSE REDUCTION: This exam was performed according to the departmental dose-optimization program which includes automated exposure control, adjustment of the mA and/or kV according to patient size and/or use of iterative reconstruction technique. COMPARISON:  None Available. FINDINGS: Brain: No acute intracranial hemorrhage. No focal mass lesion. No CT evidence of acute infarction. No midline shift or mass effect. No hydrocephalus. Basilar cisterns are patent. There are periventricular and subcortical white matter hypodensities. Generalized cortical atrophy. Vascular: No hyperdense vessel or unexpected calcification. Skull: Normal. Negative for fracture or focal lesion. Sinuses/Orbits: Paranasal sinuses and mastoid air cells are clear. Orbits are clear. Other: None. IMPRESSION: 1. No acute intracranial findings. 2. Atrophy and white matter microvascular disease. Electronically Signed   By: Genevive Bi M.D.   On: 07/27/2023 13:35   DG Chest 2 View  Result Date: 07/27/2023 CLINICAL DATA:  Fatigue.  Weakness.  Bradycardia. EXAM: CHEST - 2 VIEW COMPARISON:  05/01/2023 FINDINGS: The heart size and mediastinal contours are within normal limits. Stable linear opacity in left lung base, consistent with scarring. No evidence of acute infiltrate or edema. Thoracic spine neurostimulator leads noted. IMPRESSION: No active cardiopulmonary disease. Electronically Signed   By: Danae Orleans M.D.   On: 07/27/2023 12:58     Assessment and Plan:   Symptomatic Sinus Bradycardia though in SR with big PVCs on monitor only wore monitor 3 days and 6 hours.  With PVC burden 11%.   Dr. Eldridge Dace held back on BB with some bradycardia as well.   Offered to consult with EP.   She had increase of fatigue and lightheadedness though BP was stable to elevated.  Currently HR SB at  51.   No BB per pt and daughter.   Probable EP consult in pt that is usually active with sewing, oil painting, keeping her own house.  HTN/DM2/HLD per IM Anticoagulation for PE on xarelto    Risk Assessment/Risk Scores:     For questions or updates, please contact [facility] Please consult [email or web address] for contact info under    Signed, Nada Boozer, NP  07/28/2023 1:48 PM  Patient seen and examined. Agree with assessment and plan.  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  She was admitted in May 2020 for with bradycardia and at that time had bigeminal rhythm with heart rates in the 40s.  Valuated during that admission by Dr. Jiles Garter.  She subsequently wore a monitor which showed sinus rhythm with frequent PVCs representing 11% of beats without evidence for VT.  She did have rare SVT/ectopic atrial tachycardia 4-5 beats.  She has seen Dr. Royann Shivers in the past.  She presented to the hospital today again with slow heart rate in the 50s and was noted to have transient bigeminal rhythm.  She has noticed some increased fatigability and dizziness she denies any episodic chest tightness.   Presently, she feels well.  When she is not having ectopy or heart rate is around 60 bpm, she does have pauses post PVC.    He has not been on any beta-blocker therapy at home.  Blood pressure currently is 137/41 with pulse in the mid 50s.  She appears younger than her 89 years of age.  There is no JVD.  Her lungs are clear.  Rhythm is regular with a 1/6 systolic murmur.  There is no S3 gallop.  Abdomen is soft and nontender.  There is no significant edema, clubbing or cyanosis.  Neurologic exam is grossly nonfocal.  An echo Doppler study from April 2022  showed EF at 60 to 65% with mild LVH, grade 1 diastolic dysfunction, and mild aortic valve sclerosis.  Resolute, will follow-up laboratory, monitor cardiac rhythm.  Recheck echo Doppler study.  She is on anticoagulation for PE with Xarelto.  Will obtain EP evaluation as recommended above.   Lennette Bihari, MD, [facility] 07/28/2023 4:56 PM

## 2023-07-28 NOTE — Evaluation (Signed)
Physical Therapy Evaluation Patient Details Name: Sherry Holloway MRN: [id number] DOB: [birth year] Today's Date: 07/28/2023  History of Present Illness  89 y/o admitted with increased fatigue and dizziness. She was found to be bradycardic.  PMH:  DM, HTN, HLD, GERD, chronic pain,, h/o PE  CHF was seen for bradycardia by Dr. Alex Gardener May '84.  Clinical Impression  Pt admitted with above diagnosis. Pt was able to ambulate well with RW without LOB.  Should progress well and has equipment. Would benefit from HHPT. Will follow acutely.  Pt currently with functional limitations due to the deficits listed below (see PT Problem List). Pt will benefit from acute skilled PT to increase their independence and safety with mobility to allow discharge.           If plan is discharge home, recommend the following: Assistance with cooking/housework;Assist for transportation;Help with stairs or ramp for entrance   Can travel by private vehicle        Equipment Recommendations None recommended by PT  Recommendations for Other Services       Functional Status Assessment Patient has had a recent decline in their functional status and demonstrates the ability to make significant improvements in function in a reasonable and predictable amount of time.     Precautions / Restrictions Precautions Precautions: Fall Restrictions Weight Bearing Restrictions: No      Mobility  Bed Mobility Overal bed mobility: Independent                  Transfers Overall transfer level: Needs assistance Equipment used: Rolling walker (2 wheels) Transfers: Sit to/from Stand Sit to Stand: Contact guard assist           General transfer comment: A little steadying assist and cues for hand placement    Ambulation/Gait Ambulation/Gait assistance: Contact guard assist Gait Distance (Feet): 200 Feet Assistive device: Rolling walker (2 wheels) Gait Pattern/deviations: Step-through pattern, Decreased stride  length   Gait velocity interpretation: 1.31 - 2.62 ft/sec, indicative of limited community ambulator   General Gait Details: Pt was able to ambulate with RW wtih overall good stabiilty and no LOB.  Min challenges to balance.  Stairs            Wheelchair Mobility     Tilt Bed    Modified Rankin (Stroke Patients Only)       Balance                                             Pertinent Vitals/Pain Pain Assessment Pain Assessment: No/denies pain    Home Living Family/patient expects to be discharged to:: Private residence Living Arrangements: Alone Available Help at Discharge: Family;Available PRN/intermittently (daughter is a Engineer, civil (consulting) at [employer] and lives close by to pt) Type of Home: House Home Access: Level entry       Home Layout: One level Home Equipment: Rollator (4 wheels);Grab bars - tub/shower;Shower seat      Prior Function Prior Level of Function : Independent/Modified Independent             Mobility Comments: Pt states she always uses rollator ADLs Comments: Pt was I B/D PTA, Pt cleans and cooks some     Extremity/Trunk Assessment   Upper Extremity Assessment Upper Extremity Assessment: Defer to OT evaluation    Lower Extremity Assessment Lower Extremity Assessment: Overall WFL for tasks assessed  Cervical / Trunk Assessment Cervical / Trunk Assessment: Normal  Communication   Communication Communication: No apparent difficulties  Cognition Arousal: Alert Behavior During Therapy: WFL for tasks assessed/performed Overall Cognitive Status: Within Functional Limits for tasks assessed                                          General Comments General comments (skin integrity, edema, etc.): 57 bpm, 99% RA    Exercises     Assessment/Plan    PT Assessment Patient needs continued PT services  PT Problem List Decreased activity tolerance;Decreased balance;Decreased mobility;Decreased safety  awareness;Decreased knowledge of use of DME;Decreased knowledge of precautions;Cardiopulmonary status limiting activity       PT Treatment Interventions DME instruction;Gait training;Functional mobility training;Therapeutic activities;Therapeutic exercise;Balance training;Wheelchair mobility training;Patient/family education    PT Goals (Current goals can be found in the Care Plan section)  Acute Rehab PT Goals Patient Stated Goal: to go home PT Goal Formulation: With patient Time For Goal Achievement: 08/11/23 Potential to Achieve Goals: Good    Frequency Min 1X/week     Co-evaluation               AM-PAC PT "6 Clicks" Mobility  Outcome Measure Help needed turning from your back to your side while in a flat bed without using bedrails?: None Help needed moving from lying on your back to sitting on the side of a flat bed without using bedrails?: A Little Help needed moving to and from a bed to a chair (including a wheelchair)?: A Little Help needed standing up from a chair using your arms (e.g., wheelchair or bedside chair)?: A Little Help needed to walk in hospital room?: A Little Help needed climbing 3-5 steps with a railing? : A Little 6 Click Score: 19    End of Session Equipment Utilized During Treatment: Gait belt Activity Tolerance: Patient limited by fatigue Patient left: in chair;with call bell/phone within reach;with chair alarm set Nurse Communication: Mobility status PT Visit Diagnosis: Muscle weakness (generalized) (M62.81)    Time: 3329-5188 PT Time Calculation (min) (ACUTE ONLY): 21 min   Charges:   PT Evaluation $PT Eval Moderate Complexity: 1 Mod   PT General Charges $$ ACUTE PT VISIT: 1 Visit          M,PT Acute Rehab Services [phone]   Bevelyn Buckles 07/28/2023, 3:02 PM

## 2023-07-28 NOTE — Progress Notes (Signed)
   07/28/23 1103  Spiritual Encounters  Type of Visit Initial  Care provided to: Patient  Referral source Patient request  Reason for visit Routine spiritual support  OnCall Visit No  Spiritual Framework  Presenting Themes Meaning/purpose/sources of inspiration;Community and relationships  Community/Connection Family   Chaplain responded to a request to visit Pt. Pt shared with Chaplain about her family and feelings about this time at the hospital. Pt misses being at home and do things by herself, like feeding birds and gardening. Pt also feels she lacks motivation for painting, which she used to do very often up until three years ago, when her husband died. Chaplain and Pt talked about her motivation or inspiration for painting again. Pt recalled joyfully a painting she did for her mother and both Chaplain accompanied Pt explore what could bring that inspiration back again. Pt feels painting brings her life and that she would like to find that motivation again. Pt was very grateful with Chaplain's visit.

## 2023-07-28 NOTE — Progress Notes (Signed)
Mobility Specialist Progress Note:    07/28/23 1708  Mobility  Activity Ambulated with assistance in hallway  Level of Assistance Contact guard assist, steadying assist  Assistive Device Front wheel walker  Distance Ambulated (ft) 300 ft  Activity Response Tolerated well  Mobility Referral Yes  $Mobility charge 1 Mobility  Mobility Specialist Start Time (ACUTE ONLY) 1430  Mobility Specialist Stop Time (ACUTE ONLY) 1442  Mobility Specialist Time Calculation (min) (ACUTE ONLY) 12 min   Received pt in bed having no complaints and agreeable to mobility. Pt was asymptomatic throughout ambulation and returned to room w/o fault. Left in bed w/ call bell in reach and all needs met.   Thompson Grayer Mobility Specialist  Please contact vis Secure Chat or  Rehab Office [phone]

## 2023-07-28 NOTE — Plan of Care (Signed)

## 2023-07-28 NOTE — Progress Notes (Signed)
Echocardiogram 2D Echocardiogram has been performed.  Warren Lacy  RDCS 07/28/2023, 3:38 PM

## 2023-07-29 DIAGNOSIS — R001 Bradycardia, unspecified: Secondary | ICD-10-CM | POA: Diagnosis not present

## 2023-07-29 LAB — GLUCOSE, CAPILLARY
Glucose-Capillary: 80 mg/dL (ref 70–99)
Glucose-Capillary: 99 mg/dL (ref 70–99)
Glucose-Capillary: 149 mg/dL — ABNORMAL HIGH (ref 70–99)

## 2023-07-29 NOTE — Progress Notes (Signed)
[facility] PROGRESS NOTE  Sherry Holloway (DOB: [birth year]) RUE:[id number] PCP: Donato Schultz, DO Outpatient Specialists: Cardiology, Dr. Eldridge Dace  Brief Narrative: Sherry Holloway is a 89 y.o. female with a history of NIDT2DM, HTN, HLD, GERD, chronic pain, h/o PE on DOAC, HFpEF, bradycardia who presented to the ED on 07/27/2023 for fatigue and dizziness, found to be bradycardic into 50's bpm with bigeminy PVCs with related lightheadedness. ECG also demonstrated RBBB, LAFB. She was admitted [facility] for cardiology evaluation, EP consultation pending.   Subjective: No real changes. She got up and walked with PT, some fatigue but no syncope. No palpitations or chest pain.   Objective: BP (!) 182/46 (BP Location: Right Arm)   Pulse (!) 45   Temp 98.3 F (36.8 C) (Oral)   Resp 18   Ht 5\' 4"  (1.626 m)   Wt 68.5 kg   SpO2 98%   BMI 25.92 kg/m   Gen: Elderly but well nourished female in no distress in chair Pulm: Clear, nonlabored  CV: Regular bradycardia with occasional premature beat. No MRG or pitting edema GI: Soft, NT, ND, +BS Neuro: Alert and oriented. No new focal deficits. Ext: Warm, no deformities. Skin: No rashes, lesions or ulcers on visualized skin   Assessment & Plan: Symptomatic sinus bradycardia, PVCs: - Avoid negative chronotropic agents - Echo showed LVEF 65-70%, mod asymmetric septal LVH, indeterminate diastolic parameters, elevated LVEDP. - Maintain stable metabolic parameters. TSH is normal at 2.056.  - Awaiting EP consultation.     Chronic pain: Stable. - Continue home MS CONTIN   Chronic HFpEF, HTN:  - Continue bumex (defer to cardiology Re: additional dose today), jardiance, ARB. SBP elevated.   History of PE: - Continue xarelto   History of esophageal cancer: Clinically in remission - Follows with oncology as an outpatient  NIDT2DM: HbA1c 7.3% which is adequate control for this 89yo F.  - Giving SSI in place of home oral medications  at this time.   Tyrone Nine, MD [facility] [email or web address] 07/29/2023, 11:21 AM

## 2023-07-29 NOTE — Discharge Summary (Signed)
Physician Discharge Summary   Patient: Sherry Holloway MRN: [id number] DOB: [birth year]  Admit date:     07/27/2023  Discharge date: {dischdate:26783}  Discharge Physician: Tyrone Nine   PCP: Donato Schultz, DO   Recommendations at discharge:  {Tip this will not be part of the note when signed- Example include specific recommendations for outpatient follow-up, pending tests to follow-up on. (Optional):26781}  ***  Discharge Diagnoses: Principal Problem:   Symptomatic sinus bradycardia Active Problems:   Gastroesophageal reflux disease with esophagitis without hemorrhage   Hypertension   Diabetes mellitus without complication (HCC)   Hyperlipidemia  Resolved Problems:   * No resolved hospital problems. [facility] Course: No notes on file  Assessment and Plan: * Symptomatic sinus bradycardia Patient with documented bigeminy with underlying bradycardia. This was addressed in May '24. She presented now with increased fatigue and dizziness. At admission exam underlying rhythm approximately 60  Plan Per Cardiology recommendation - 2D echo ordered  Will continue home medications  Cardiology to see 07/28/23  Hyperlipidemia Last lipid panel 10/02/22  total cholesterol 135, HDL 71, LDL 57  Plan Continue home regimen  Diabetes mellitus without complication (HCC) Patient well controlled for her age with last A1C 7.3% 05/05/23  Plan Continue home regimen  Ss coverage  Hypertension BP adequately controlled on present regimen  Plan Continue home medication  Gastroesophageal reflux disease with esophagitis without hemorrhage No active complaints.   Plan Continue PPI      {Tip this will not be part of the note when signed Body mass index is 25.92 kg/m. , ,  (Optional):26781}  {(NOTE) Pain control PDMP Statment (Optional):26782} Consultants: *** Procedures performed: ***  Disposition: {Plan; Disposition:26390} Diet recommendation:  Discharge Diet Orders (From  admission, onward)     Start     Ordered   07/29/23 0000  Diet - low sodium heart healthy        07/29/23 1445           {Diet_Plan:26776} DISCHARGE MEDICATION: Allergies as of 07/29/2023       Reactions   Gramicidin Other (See Comments)   Unknown reaction type   Sulfa Antibiotics Rash   Wound Dressing Adhesive Rash        Medication List     TAKE these medications    acetaminophen 325 MG tablet Commonly known as: TYLENOL Take 352-650 mg by mouth every 6 (six) hours as needed for mild pain or moderate pain.   bumetanide 1 MG tablet Commonly known as: BUMEX Take 0.5-1 tablets (0.5-1 mg total) by mouth daily as needed (swelling). What changed:  how much to take when to take this   Creon 36000-114000 units Cpep capsule Generic drug: lipase/protease/amylase TAKE 1 CAPSULE THREE TIMES A DAY BEFORE MEALS What changed: See the new instructions.   docusate sodium 100 MG capsule Commonly known as: Colace Take 1 capsule (100 mg total) by mouth 2 (two) times daily. What changed:  how much to take when to take this   famciclovir 125 MG tablet Commonly known as: FAMVIR TAKE 1 TABLET DAILY   FREESTYLE LITE test strip Generic drug: glucose blood CHECK BLOOD SUGAR TWICE A DAY   glimepiride 4 MG tablet Commonly known as: AMARYL Take 1 tablet (4 mg total) by mouth 2 (two) times daily.   Jardiance 25 MG Tabs tablet Generic drug: empagliflozin TAKE 1 TABLET DAILY BEFORE BREAKFAST   latanoprost 0.005 % ophthalmic solution Commonly known as: XALATAN Place 1 drop into both eyes  at bedtime.   Linzess 145 MCG Caps capsule Generic drug: linaclotide TAKE 1 CAPSULE DAILY BEFORE BREAKFAST What changed: See the new instructions.   mirtazapine 15 MG tablet Commonly known as: REMERON TAKE 1 TABLET AT BEDTIME   morphine 30 MG 12 hr tablet Commonly known as: MS CONTIN Take 30 mg by mouth every 12 (twelve) hours.   multivitamin with minerals Tabs tablet Take 1 tablet  by mouth daily with supper.   omeprazole 20 MG capsule Commonly known as: PRILOSEC Take 1 capsule (20 mg total) by mouth 2 (two) times daily before a meal.   senna 8.6 MG Tabs tablet Commonly known as: SENOKOT Take 1 tablet (8.6 mg total) by mouth daily. What changed: when to take this   valsartan 40 MG tablet Commonly known as: DIOVAN TAKE 1 TABLET DAILY WITH BREAKFAST   Xarelto 20 MG Tabs tablet Generic drug: rivaroxaban TAKE 1 TABLET DAILY WITH SUPPER, START ONLY AFTER THE STARTER PACK HAS BEEN COMPLETED What changed: See the new instructions.        Follow-up Information     Donato Schultz, DO Follow up.   Specialty: Family Medicine Contact information: [address] [place] [address] [phone]         Thurmon Fair, MD Follow up.   Specialty: Cardiology Contact information: [address] [place] [address] [phone]                Discharge Exam: Ceasar Mons Weights   07/27/23 1155  Weight: 68.5 kg   ***  Condition at discharge: {DC Condition:26389}  The results of significant diagnostics from this hospitalization (including imaging, microbiology, ancillary and laboratory) are listed below for reference.   Imaging Studies: ECHOCARDIOGRAM COMPLETE  Result Date: 07/28/2023    ECHOCARDIOGRAM REPORT   Patient Name:   Sherry Holloway Date of Exam: 07/28/2023 Medical Rec #:  [id number]        Height:       64.0 in Accession #:    [id number]       Weight:       151.0 lb Date of Birth:  1928-05-01        BSA:          1.736 m Patient Age:    89 years         BP:           153/54 mmHg Patient Gender: F                HR:           61 bpm. Exam Location:  Inpatient Procedure: 2D Echo, Color Doppler and Cardiac Doppler Indications:    I50.31 Acute diastolic (congestive) heart failure  History:        Patient has prior history of Echocardiogram examinations, most                 recent 03/17/2021. Risk  Factors:Hypertension, Diabetes,                 Dyslipidemia and Sleep Apnea.  Sonographer:    Irving Burton Senior RDCS Referring Phys: Rosalyn Gess NORINS IMPRESSIONS  1. Left ventricular ejection fraction, by estimation, is 65 to 70%. The left ventricle has normal function. The left ventricle has no regional wall motion abnormalities. There is moderate asymmetric left ventricular hypertrophy of the septal segment. Left ventricular diastolic parameters are indeterminate. Elevated left ventricular end-diastolic pressure.  2. Right ventricular systolic function  is normal. The right ventricular size is normal. There is normal pulmonary artery systolic pressure. The estimated right ventricular systolic pressure is 18.5 mmHg.  3. The mitral valve is degenerative. Mild mitral valve regurgitation. No evidence of mitral stenosis.  4. The aortic valve has an indeterminant number of cusps. There is mild thickening of the aortic valve. Aortic valve regurgitation is trivial.  5. The inferior vena cava is normal in size with <50% respiratory variability, suggesting right atrial pressure of 8 mmHg. FINDINGS  Left Ventricle: Left ventricular ejection fraction, by estimation, is 65 to 70%. The left ventricle has normal function. The left ventricle has no regional wall motion abnormalities. The left ventricular internal cavity size was normal in size. There is  moderate asymmetric left ventricular hypertrophy of the septal segment. Left ventricular diastolic parameters are indeterminate. Elevated left ventricular end-diastolic pressure. Right Ventricle: The right ventricular size is normal. No increase in right ventricular wall thickness. Right ventricular systolic function is normal. There is normal pulmonary artery systolic pressure. The tricuspid regurgitant velocity is 1.62 m/s, and  with an assumed right atrial pressure of 8 mmHg, the estimated right ventricular systolic pressure is 18.5 mmHg. Left Atrium: Left atrial size was normal in  size. Right Atrium: Right atrial size was normal in size. Pericardium: There is no evidence of pericardial effusion. Mitral Valve: The mitral valve is degenerative in appearance. There is mild thickening of the mitral valve leaflet(s). Mild mitral valve regurgitation. No evidence of mitral valve stenosis. Tricuspid Valve: The tricuspid valve is normal in structure. Tricuspid valve regurgitation is trivial. No evidence of tricuspid stenosis. Aortic Valve: The aortic valve has an indeterminant number of cusps. There is mild thickening of the aortic valve. There is moderate aortic valve annular calcification. Aortic valve regurgitation is trivial. Pulmonic Valve: The pulmonic valve was normal in structure. Pulmonic valve regurgitation is not visualized. Aorta: The aortic root is normal in size and structure and the ascending aorta was not well visualized. Venous: The inferior vena cava is normal in size with less than 50% respiratory variability, suggesting right atrial pressure of 8 mmHg. IAS/Shunts: No atrial level shunt detected by color flow Doppler.  LEFT VENTRICLE PLAX 2D LVIDd:         3.80 cm   Diastology LVIDs:         2.30 cm   LV e' medial:    4.13 cm/s LV PW:         1.10 cm   LV E/e' medial:  21.6 LV IVS:        1.30 cm   LV e' lateral:   7.40 cm/s LVOT diam:     2.10 cm   LV E/e' lateral: 12.1 LV SV:         75 LV SV Index:   43 LVOT Area:     3.46 cm  RIGHT VENTRICLE RV S prime:     9.25 cm/s TAPSE (M-mode): 2.0 cm LEFT ATRIUM             Index        RIGHT ATRIUM           Index LA diam:        3.00 cm 1.73 cm/m   RA Area:     13.80 cm LA Vol (A2C):   61.0 ml 35.14 ml/m  RA Volume:   29.00 ml  16.70 ml/m LA Vol (A4C):   42.3 ml 24.36 ml/m LA Biplane Vol: 51.9 ml 29.89 ml/m  AORTIC VALVE LVOT Vmax:   84.70 cm/s LVOT Vmean:  64.400 cm/s LVOT VTI:    0.216 m  AORTA Ao Root diam: 3.40 cm MITRAL VALVE                TRICUSPID VALVE MV Area (PHT): 2.19 cm     TR Peak grad:   10.5 mmHg MV Decel Time:  347 msec     TR Vmax:        162.00 cm/s MV E velocity: 89.30 cm/s MV A velocity: 116.00 cm/s  SHUNTS MV E/A ratio:  0.77         Systemic VTI:  0.22 m                             Systemic Diam: 2.10 cm Kardie Tobb DO Electronically signed by Thomasene Ripple DO Signature Date/Time: 07/28/2023/5:24:35 PM    Final    CT Head Wo Contrast  Result Date: 07/27/2023 CLINICAL DATA:  Bradycardia EXAM: CT HEAD WITHOUT CONTRAST TECHNIQUE: Contiguous axial images were obtained from the base of the skull through the vertex without intravenous contrast. RADIATION DOSE REDUCTION: This exam was performed according to the departmental dose-optimization program which includes automated exposure control, adjustment of the mA and/or kV according to patient size and/or use of iterative reconstruction technique. COMPARISON:  None Available. FINDINGS: Brain: No acute intracranial hemorrhage. No focal mass lesion. No CT evidence of acute infarction. No midline shift or mass effect. No hydrocephalus. Basilar cisterns are patent. There are periventricular and subcortical white matter hypodensities. Generalized cortical atrophy. Vascular: No hyperdense vessel or unexpected calcification. Skull: Normal. Negative for fracture or focal lesion. Sinuses/Orbits: Paranasal sinuses and mastoid air cells are clear. Orbits are clear. Other: None. IMPRESSION: 1. No acute intracranial findings. 2. Atrophy and white matter microvascular disease. Electronically Signed   By: Genevive Bi M.D.   On: 07/27/2023 13:35   DG Chest 2 View  Result Date: 07/27/2023 CLINICAL DATA:  Fatigue.  Weakness.  Bradycardia. EXAM: CHEST - 2 VIEW COMPARISON:  05/01/2023 FINDINGS: The heart size and mediastinal contours are within normal limits. Stable linear opacity in left lung base, consistent with scarring. No evidence of acute infiltrate or edema. Thoracic spine neurostimulator leads noted. IMPRESSION: No active cardiopulmonary disease. Electronically Signed   By:  Danae Orleans M.D.   On: 07/27/2023 12:58    Microbiology: Results for orders placed or performed during the hospital encounter of 11/25/21  Resp Panel by RT-PCR (Flu A&B, Covid) Nasopharyngeal Swab     Status: None   Collection Time: 11/25/21  8:15 PM   Specimen: Nasopharyngeal Swab; Nasopharyngeal(NP) swabs in vial transport medium  Result Value Ref Range Status   SARS Coronavirus 2 by RT PCR NEGATIVE NEGATIVE Final    Comment: (NOTE) SARS-CoV-2 target nucleic acids are NOT DETECTED.  The SARS-CoV-2 RNA is generally detectable in upper respiratory specimens during the acute phase of infection. The lowest concentration of SARS-CoV-2 viral copies this assay can detect is 138 copies/mL. A negative result does not preclude SARS-Cov-2 infection and should not be used as the sole basis for treatment or other patient management decisions. A negative result may occur with  improper specimen collection/handling, submission of specimen other than nasopharyngeal swab, presence of viral mutation(s) within the areas targeted by this assay, and inadequate number of viral copies(<138 copies/mL). A negative result must be combined with clinical observations, patient history, and epidemiological information. The expected result is  Negative.  Fact Sheet for Patients:  BloggerCourse.com  Fact Sheet for Healthcare Providers:  SeriousBroker.it  This test is no t yet approved or cleared by the [place] FDA and  has been authorized for detection and/or diagnosis of SARS-CoV-2 by FDA under an Emergency Use Authorization (EUA). This EUA will remain  in effect (meaning this test can be used) for the duration of the COVID-19 declaration under Section 564(b)(1) of the Act, 21 U.S.C.section 360bbb-3(b)(1), unless the authorization is terminated  or revoked sooner.       Influenza A by PCR NEGATIVE NEGATIVE Final   Influenza B by PCR NEGATIVE  NEGATIVE Final    Comment: (NOTE) The Xpert Xpress SARS-CoV-2/FLU/RSV plus assay is intended as an aid in the diagnosis of influenza from Nasopharyngeal swab specimens and should not be used as a sole basis for treatment. Nasal washings and aspirates are unacceptable for Xpert Xpress SARS-CoV-2/FLU/RSV testing.  Fact Sheet for Patients: BloggerCourse.com  Fact Sheet for Healthcare Providers: SeriousBroker.it  This test is not yet approved or cleared by the [place] FDA and has been authorized for detection and/or diagnosis of SARS-CoV-2 by FDA under an Emergency Use Authorization (EUA). This EUA will remain in effect (meaning this test can be used) for the duration of the COVID-19 declaration under Section 564(b)(1) of the Act, 21 U.S.C. section 360bbb-3(b)(1), unless the authorization is terminated or revoked.  Performed at [facility], [address] [place], [place] [address]   Resp Panel by RT-PCR (Flu A&B, Covid) Nasopharyngeal Swab     Status: None   Collection Time: 11/29/21  1:49 PM   Specimen: Nasopharyngeal Swab; Nasopharyngeal(NP) swabs in vial transport medium  Result Value Ref Range Status   SARS Coronavirus 2 by RT PCR NEGATIVE NEGATIVE Final    Comment: (NOTE) SARS-CoV-2 target nucleic acids are NOT DETECTED.  The SARS-CoV-2 RNA is generally detectable in upper respiratory specimens during the acute phase of infection. The lowest concentration of SARS-CoV-2 viral copies this assay can detect is 138 copies/mL. A negative result does not preclude SARS-Cov-2 infection and should not be used as the sole basis for treatment or other patient management decisions. A negative result may occur with  improper specimen collection/handling, submission of specimen other than nasopharyngeal swab, presence of viral mutation(s) within the areas targeted by this assay, and inadequate number of viral copies(<138  copies/mL). A negative result must be combined with clinical observations, patient history, and epidemiological information. The expected result is Negative.  Fact Sheet for Patients:  BloggerCourse.com  Fact Sheet for Healthcare Providers:  SeriousBroker.it  This test is no t yet approved or cleared by the [place] FDA and  has been authorized for detection and/or diagnosis of SARS-CoV-2 by FDA under an Emergency Use Authorization (EUA). This EUA will remain  in effect (meaning this test can be used) for the duration of the COVID-19 declaration under Section 564(b)(1) of the Act, 21 U.S.C.section 360bbb-3(b)(1), unless the authorization is terminated  or revoked sooner.       Influenza A by PCR NEGATIVE NEGATIVE Final   Influenza B by PCR NEGATIVE NEGATIVE Final    Comment: (NOTE) The Xpert Xpress SARS-CoV-2/FLU/RSV plus assay is intended as an aid in the diagnosis of influenza from Nasopharyngeal swab specimens and should not be used as a sole basis for treatment. Nasal washings and aspirates are unacceptable for Xpert Xpress SARS-CoV-2/FLU/RSV testing.  Fact Sheet for Patients: BloggerCourse.com  Fact Sheet for Healthcare Providers: SeriousBroker.it  This  test is not yet approved or cleared by the [place] and has been authorized for detection and/or diagnosis of SARS-CoV-2 by FDA under an Emergency Use Authorization (EUA). This EUA will remain in effect (meaning this test can be used) for the duration of the COVID-19 declaration under Section 564(b)(1) of the Act, 21 U.S.C. section 360bbb-3(b)(1), unless the authorization is terminated or revoked.  Performed at [facility], [address], [place], [place] [address]     Labs: CBC: Recent Labs  Lab 07/27/23 1326  WBC 7.0  NEUTROABS 5.5  HGB 11.7*  HCT 36.9  MCV 88.5  PLT 178   Basic  Metabolic Panel: Recent Labs  Lab 07/27/23 1326  NA 139  K 4.1  CL 110  CO2 22  GLUCOSE 173*  BUN 26*  CREATININE 0.92  CALCIUM 9.2   Liver Function Tests: Recent Labs  Lab 07/27/23 1326  AST 16  ALT 21  ALKPHOS 71  BILITOT 0.4  PROT 6.5  ALBUMIN 3.3*   CBG: Recent Labs  Lab 07/28/23 1136 07/28/23 1639 07/28/23 2103 07/29/23 0733 07/29/23 1147  GLUCAP 121* 130* 149* 80 99    Discharge time spent: {LESS THAN/GREATER THAN:26388} 30 minutes.  Signed: Tyrone Nine, MD [facility] 07/29/2023

## 2023-07-29 NOTE — TOC Initial Note (Signed)
Transition of Care [facility]) - Initial/Assessment Note    Patient Details  Name: Sherry Holloway MRN: [id number] Date of Birth: January 05, 1928  Transition of Care [facility]) CM/SW Contact:    Gala Lewandowsky, RN Phone Number: 07/29/2023, 4:35 PM  Clinical Narrative:   Patient presented for symptomatic bradycardia. PTA patient was from home alone; however, has support of daughter. Case Manager spoke with patient and daughter and both agreeable to home health PT with [facility]. Referral made to [facility] and start of care to begin within 24-48 hours post transition home. No further needs identified at this time.                  Expected Discharge Plan: Home w Home Health Services Barriers to Discharge: No Barriers Identified   Patient Goals and CMS Choice Patient states their goals for this hospitalization and ongoing recovery are:: to return home   Choice offered to / list presented to : Patient, Adult Children Product/process development scientist Health)      Expected Discharge Plan and Services In-house Referral: NA Discharge Planning Services: CM Consult Post Acute Care Choice: Home Health Living arrangements for the past 2 months: Single Family Home Expected Discharge Date: 07/29/23                 DME Agency: NA       HH Arranged: PT HH Agency: [facility] Date HH Agency Contacted: 07/29/23 Time HH Agency Contacted: 1634 Representative spoke with at [facility] Agency: Jerilynn Som  Prior Living Arrangements/Services Living arrangements for the past 2 months: Single Family Home Lives with:: Self Patient language and need for interpreter reviewed:: Yes Do you feel safe going back to the place where you live?: Yes      Need for Family Participation in Patient Care: Yes (Comment) Care giver support system in place?: Yes (comment)   Criminal Activity/Legal Involvement Pertinent to Current Situation/Hospitalization: No - Comment as needed  Activities of Daily Living Home Assistive  Devices/Equipment: CBG Meter, Walker (specify type) ADL Screening (condition at time of admission) Patient's cognitive ability adequate to safely complete daily activities?: Yes Is the patient deaf or have difficulty hearing?: Yes Does the patient have difficulty seeing, even when wearing glasses/contacts?: Yes Does the patient have difficulty concentrating, remembering, or making decisions?: No Patient able to express need for assistance with ADLs?: Yes Does the patient have difficulty dressing or bathing?: No Independently performs ADLs?: Yes (appropriate for developmental age) Does the patient have difficulty walking or climbing stairs?: Yes Weakness of Legs: Right Weakness of Arms/Hands: None  Permission Sought/Granted Permission sought to share information with : Family Supports, Magazine features editor, Case Estate manager/land agent granted to share information with : Yes, Verbal Permission Granted     Permission granted to share info w AGENCY: [facility]        Emotional Assessment Appearance:: Appears stated age Attitude/Demeanor/Rapport: Engaged Affect (typically observed): Appropriate Orientation: : Oriented to Self, Oriented to Place, Oriented to  Time, Oriented to Situation Alcohol / Substance Use: Not Applicable Psych Involvement: No (comment)  Admission diagnosis:  Bradycardia [R00.1] Symptomatic sinus bradycardia [R00.1] Patient Active Problem List   Diagnosis Date Noted   Symptomatic sinus bradycardia 07/27/2023   Bradycardia 05/02/2023   Dizziness 05/02/2023   At high risk for injury related to fall 02/06/2023   Balance disorder 01/30/2023   Vulvar rash 04/02/2022   Hyperlipidemia associated with type 2 diabetes mellitus (HCC) 04/02/2022   Rash and nonspecific skin eruption 02/19/2022  Chronic pain disorder 11/26/2021   Choledocholithiasis 11/25/2021   Personal history of esophageal cancer 08/26/2021   Weight loss, unintentional 08/26/2021   Type 2  diabetes mellitus with diabetic polyneuropathy, without long-term current use of insulin (HCC) 06/12/2021   Snoring 04/30/2021   At risk for central sleep apnea 04/30/2021   Acute pulmonary embolism (HCC) 03/29/2021   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) 03/19/2021   Other pulmonary embolism with acute cor pulmonale, unspecified chronicity (HCC)    Subclavian arterial stenosis (HCC) 02/13/2021   Controlled type 2 diabetes mellitus with hyperglycemia, without long-term current use of insulin (HCC) 02/06/2021   Acquired trigger finger of left middle finger 01/09/2021   Acquired trigger finger of left ring finger 01/09/2021   Trigger finger of left hand 12/24/2020   Plantar wart 12/24/2020   Pancreatic mass 10/19/2020   Gastroesophageal reflux disease with esophagitis without hemorrhage 10/19/2020   Hypertension 10/19/2020   Diabetes mellitus without complication (HCC) 10/19/2020   Vaginal odor 10/19/2020   Hyperlipidemia 10/19/2020   OSA (obstructive sleep apnea) 10/19/2020   Chronic bilateral back pain 10/19/2020   PCP:  Donato Schultz, DO Pharmacy:   [facility] DELIVERY - [place], [place] - [address] [place] [address] Phone: [phone] Fax: [phone]  [id number] - [address] [place] - [address] [facility] [address] [place] [address] Phone: [phone] Fax: [phone]  Social Determinants of Health (SDOH) Social History: SDOH Screenings   Food Insecurity: No Food Insecurity (07/27/2023)  Housing: Low Risk  (07/27/2023)  Transportation Needs: No Transportation Needs (07/27/2023)  Utilities: Not At Risk (07/27/2023)  Alcohol Screen: Low Risk  (04/02/2022)  Depression (PHQ2-9): Low Risk  (01/30/2023)  Financial Resource Strain: Low Risk  (04/02/2022)  Physical Activity: Sufficiently Active (04/02/2022)  Social Connections: Moderately Isolated (04/02/2022)  Stress: No  Stress Concern Present (04/02/2022)  Tobacco Use: Medium Risk (07/27/2023)   Readmission Risk Interventions     No data to display

## 2023-07-29 NOTE — Consult Note (Cosign Needed Addendum)
ELECTROPHYSIOLOGY CONSULT NOTE    Patient ID: Sherry Holloway MRN: [id number], DOB/AGE: [id number] 87 y.o.  Admit date: 07/27/2023 Date of Consult: 07/29/2023  Primary Physician: Zola Button, Grayling Congress, DO Primary Cardiologist: Thurmon Fair, MD  Electrophysiologist: New   Referring Provider: Dr. Tresa Endo  Patient Profile: Sherry Holloway is a 89 y.o. female with a history of NIDT2DM, HTN, HLD, GERD, chronic pain, h/o PE on DOAC, HFpEF, chronic sinus bradycardia who is being seen today for the evaluation of bradycardia and fatigue at the request of Dr. Tresa Endo.  HPI:  Sherry Holloway is a 89 y.o. female with medical history as above.   Previously seen by PCP 05/01/2023 for "bradycardia" as well as intermittent dizziness with no specific trigger. EKG at that time showed bigeminy and she was sent to ED for evaluation. With frequent PVCs felt to have effective bradycardia in the setting of ectopy. Monitor placed  Monitor showed min HR 47 bpm, Max 158 bpm, but 64 on average. 12 short runs of SVT noted, the longest 5 beats. Rare PACs.  PVCs at 11%.   She presented to [facility] 8/12 with fatigue and dizziness, as well as bradycardia in the 50s.  Frequent PVCs again noted.   Echocardiogram shows EF 65-70% with moderate asymmetric LVH of the septal segment. Normal RV.   She states overall she is very active. At home on Sunday she had a single episode of HR of 42 by her home BP machine. She is not sure if it came up after that. She denies syncope. Occasional has dizziness with rapid standing for a "split second". She denies chest pain or significant SOB. Occasional edema, on diuretics.   Labs Potassium4.1 (08/11 1326)   Creatinine, ser  0.92 (08/11 1326) PLT  178 (08/11 1326) HGB  11.7* (08/11 1326) WBC 7.0 (08/11 1326) Troponin I (High Sensitivity)8 (08/11 1532).    Past Medical History:  Diagnosis Date   Arthritis    Choledocholithiasis    Diabetes mellitus without complication (HCC)     Diverticulitis    Esophageal cancer (HCC) dx'd 2020   GERD (gastroesophageal reflux disease)    Hyperlipidemia    Hypertension    OSA (obstructive sleep apnea)    Urine incontinence    Uterine cancer (HCC)      Surgical History:  Past Surgical History:  Procedure Laterality Date   ABDOMINAL HYSTERECTOMY  1972   APPENDECTOMY     BILIARY DILATION  03/05/2021   Procedure: BILIARY DILATION;  Surgeon: Lemar Lofty., MD;  Location: [facility] ENDOSCOPY;  Service: Gastroenterology;;   BILIARY DILATION  11/27/2021   Procedure: BILIARY DILATION;  Surgeon: Rachael Fee, MD;  Location: [facility] ENDOSCOPY;  Service: Endoscopy;;   BILIARY DILATION  02/11/2022   Procedure: BILIARY DILATION;  Surgeon: Lemar Lofty., MD;  Location: [facility] ENDOSCOPY;  Service: Gastroenterology;;   BILIARY STENT PLACEMENT  11/27/2021   Procedure: BILIARY STENT PLACEMENT;  Surgeon: Rachael Fee, MD;  Location: [facility] ENDOSCOPY;  Service: Endoscopy;;   BIOPSY  03/05/2021   Procedure: BIOPSY;  Surgeon: Lemar Lofty., MD;  Location: [facility] ENDOSCOPY;  Service: Gastroenterology;;   BIOPSY  11/27/2021   Procedure: BIOPSY;  Surgeon: Rachael Fee, MD;  Location: [facility] ENDOSCOPY;  Service: Endoscopy;;   CHOLECYSTECTOMY     ENDOSCOPIC RETROGRADE CHOLANGIOPANCREATOGRAPHY (ERCP) WITH PROPOFOL N/A 03/05/2021   Procedure: ENDOSCOPIC RETROGRADE CHOLANGIOPANCREATOGRAPHY (ERCP) WITH PROPOFOL;  Surgeon: Lemar Lofty., MD;  Location: [facility] ENDOSCOPY;  Service: Gastroenterology;  Laterality: N/A;  ENDOSCOPIC RETROGRADE CHOLANGIOPANCREATOGRAPHY (ERCP) WITH PROPOFOL N/A 11/27/2021   Procedure: ENDOSCOPIC RETROGRADE CHOLANGIOPANCREATOGRAPHY (ERCP) WITH PROPOFOL;  Surgeon: Rachael Fee, MD;  Location: [facility] ENDOSCOPY;  Service: Endoscopy;  Laterality: N/A;  possible EGD prior   ENDOSCOPIC RETROGRADE CHOLANGIOPANCREATOGRAPHY (ERCP) WITH PROPOFOL N/A 02/11/2022   Procedure: ENDOSCOPIC RETROGRADE CHOLANGIOPANCREATOGRAPHY  (ERCP) WITH PROPOFOL;  Surgeon: Meridee Score Netty Starring., MD;  Location: [facility] ENDOSCOPY;  Service: Gastroenterology;  Laterality: N/A;   ESOPHAGOGASTRODUODENOSCOPY N/A 11/27/2021   Procedure: ESOPHAGOGASTRODUODENOSCOPY (EGD);  Surgeon: Rachael Fee, MD;  Location: [facility] ENDOSCOPY;  Service: Endoscopy;  Laterality: N/A;   ESOPHAGOGASTRODUODENOSCOPY (EGD) WITH PROPOFOL N/A 03/05/2021   Procedure: ESOPHAGOGASTRODUODENOSCOPY (EGD) WITH PROPOFOL;  Surgeon: Meridee Score Netty Starring., MD;  Location: [facility] ENDOSCOPY;  Service: Gastroenterology;  Laterality: N/A;   EUS N/A 03/05/2021   Procedure: UPPER ENDOSCOPIC ULTRASOUND (EUS) RADIAL;  Surgeon: Meridee Score Netty Starring., MD;  Location: [facility] ENDOSCOPY;  Service: Gastroenterology;  Laterality: N/A;   LITHOTRIPSY  02/11/2022   Procedure: LITHOTRIPSY;  Surgeon: Mansouraty, Netty Starring., MD;  Location: [facility] ENDOSCOPY;  Service: Gastroenterology;;   LUMBAR LAMINECTOMY     REMOVAL OF STONES  03/05/2021   Procedure: REMOVAL OF STONES;  Surgeon: Lemar Lofty., MD;  Location: [facility] ENDOSCOPY;  Service: Gastroenterology;;   REMOVAL OF STONES  11/27/2021   Procedure: REMOVAL OF STONES;  Surgeon: Rachael Fee, MD;  Location: [facility] ENDOSCOPY;  Service: Endoscopy;;   REMOVAL OF STONES  02/11/2022   Procedure: REMOVAL OF STONES;  Surgeon: Lemar Lofty., MD;  Location: [facility] ENDOSCOPY;  Service: Gastroenterology;;   SPINAL CORD STIMULATOR IMPLANT     Not active any more.   SPYGLASS CHOLANGIOSCOPY N/A 03/05/2021   Procedure: SPYGLASS CHOLANGIOSCOPY;  Surgeon: Lemar Lofty., MD;  Location: [facility] ENDOSCOPY;  Service: Gastroenterology;  Laterality: N/A;   SPYGLASS CHOLANGIOSCOPY N/A 02/11/2022   Procedure: SPYGLASS CHOLANGIOSCOPY;  Surgeon: Lemar Lofty., MD;  Location: [facility] ENDOSCOPY;  Service: Gastroenterology;  Laterality: N/A;   SPYGLASS LITHOTRIPSY N/A 03/05/2021   Procedure: GEXBMWUX LITHOTRIPSY;  Surgeon: Lemar Lofty., MD;  Location: [facility]  ENDOSCOPY;  Service: Gastroenterology;  Laterality: N/A;   SPYGLASS LITHOTRIPSY N/A 02/11/2022   Procedure: LKGMWNUU LITHOTRIPSY;  Surgeon: Lemar Lofty., MD;  Location: [facility] ENDOSCOPY;  Service: Gastroenterology;  Laterality: N/A;   STENT REMOVAL  02/11/2022   Procedure: STENT REMOVAL;  Surgeon: Lemar Lofty., MD;  Location: [facility] ENDOSCOPY;  Service: Gastroenterology;;   TONSILLECTOMY     TOTAL KNEE ARTHROPLASTY Right      Medications Prior to Admission  Medication Sig Dispense Refill Last Dose   acetaminophen (TYLENOL) 325 MG tablet Take 352-650 mg by mouth every 6 (six) hours as needed for mild pain or moderate pain.   Past Week   bumetanide (BUMEX) 1 MG tablet Take 0.5-1 tablets (0.5-1 mg total) by mouth daily as needed (swelling). (Patient taking differently: Take 0.5 mg by mouth daily.) 90 tablet 1 07/27/2023 at 0900   CREON 36000-114000 units CPEP capsule TAKE 1 CAPSULE THREE TIMES A DAY BEFORE MEALS (Patient taking differently: Take 36,000 Units by mouth 3 (three) times daily before meals.) 2 capsule 4 07/27/2023 at 0900   docusate sodium (COLACE) 100 MG capsule Take 1 capsule (100 mg total) by mouth 2 (two) times daily. (Patient taking differently: Take 200 mg by mouth daily.) 10 capsule 0 07/27/2023 at 0900   famciclovir (FAMVIR) 125 MG tablet TAKE 1 TABLET DAILY 90 tablet 3 07/27/2023 at 0900   glimepiride (AMARYL) 4 MG tablet Take 1 tablet (4  mg total) by mouth 2 (two) times daily. 180 tablet 0 07/27/2023 at 0900   glucose blood (FREESTYLE LITE) test strip CHECK BLOOD SUGAR TWICE A DAY 200 strip 12 07/27/2023   JARDIANCE 25 MG TABS tablet TAKE 1 TABLET DAILY BEFORE BREAKFAST 90 tablet 3 07/27/2023 at 0900   latanoprost (XALATAN) 0.005 % ophthalmic solution Place 1 drop into both eyes at bedtime. 2.5 mL 2 07/27/2023 at 2100   linaclotide (LINZESS) 145 MCG CAPS capsule TAKE 1 CAPSULE DAILY BEFORE BREAKFAST (Patient taking differently: Take 145 mcg by mouth daily before  breakfast.) 90 capsule 1 07/27/2023 at 0900   mirtazapine (REMERON) 15 MG tablet TAKE 1 TABLET AT BEDTIME 90 tablet 3 07/27/2023 at 2100   morphine (MS CONTIN) 30 MG 12 hr tablet Take 30 mg by mouth every 12 (twelve) hours.   07/27/2023 at 0900   Multiple Vitamin (MULTIVITAMIN WITH MINERALS) TABS tablet Take 1 tablet by mouth daily with supper.   07/27/2023 at 2100   omeprazole (PRILOSEC) 20 MG capsule Take 1 capsule (20 mg total) by mouth 2 (two) times daily before a meal. 180 capsule 1 07/27/2023 at 0900   senna (SENOKOT) 8.6 MG TABS tablet Take 1 tablet (8.6 mg total) by mouth daily. (Patient taking differently: Take 1 tablet by mouth every evening.) 120 tablet 0 07/27/2023 at 2100   valsartan (DIOVAN) 40 MG tablet TAKE 1 TABLET DAILY WITH BREAKFAST 90 tablet 1 07/27/2023 at 0900   XARELTO 20 MG TABS tablet TAKE 1 TABLET DAILY WITH SUPPER, START ONLY AFTER THE STARTER PACK HAS BEEN COMPLETED (Patient taking differently: Take 20 mg by mouth daily with supper.) 90 tablet 3 07/26/2023 at 1800    Inpatient Medications:   docusate sodium  100 mg Oral BID   empagliflozin  25 mg Oral QAC breakfast   glimepiride  4 mg Oral BID WC   insulin aspart  0-15 Units Subcutaneous TID WC   irbesartan  75 mg Oral Daily   latanoprost  1 drop Both Eyes QHS   linaclotide  145 mcg Oral QAC breakfast   lipase/protease/amylase  36,000 Units Oral TID AC   mirtazapine  15 mg Oral QHS   morphine  30 mg Oral Q12H   pantoprazole  40 mg Oral Daily   rivaroxaban  20 mg Oral Q supper   senna  2 tablet Oral q AM   sodium chloride flush  3 mL Intravenous Q12H   valACYclovir  250 mg Oral Daily    Allergies:  Allergies  Allergen Reactions   Gramicidin Other (See Comments)    Unknown reaction type   Sulfa Antibiotics Rash   Wound Dressing Adhesive Rash    Family History  Problem Relation Age of Onset   Diabetes Mother    Heart disease Father    Lung cancer Sister    Lung cancer Son    Hypertension Son     Hypertension Daughter    Diabetes Son    Hypertension Son    Diabetes Daughter    Hypertension Daughter      Physical Exam: Vitals:   07/28/23 1412 07/28/23 1935 07/28/23 2015 07/29/23 0613  BP:  (!) 134/48  (!) 182/46  Pulse:  (!) 52 (!) 57 (!) 45  Resp: 18 18  18   Temp: 98.2 F (36.8 C) 98.1 F (36.7 C)  98.3 F (36.8 C)  TempSrc: Oral Oral  Oral  SpO2:  96% 95% 98%  Weight:      Height:  GEN- NAD, A&O x 3, normal affect HEENT: Normocephalic, atraumatic Lungs- CTAB, Normal effort.  Heart- Regular rate and rhythm, No M/G/R.  GI- Soft, NT, ND.  Extremities- No clubbing, cyanosis, or edema   Radiology/Studies: ECHOCARDIOGRAM COMPLETE  Result Date: 07/28/2023    ECHOCARDIOGRAM REPORT   Patient Name:   Laportia Damery Bily Date of Exam: 07/28/2023 Medical Rec #:  [id number]        Height:       64.0 in Accession #:    [id number]       Weight:       151.0 lb Date of Birth:  03-09-1928        BSA:          1.736 m Patient Age:    89 years         BP:           153/54 mmHg Patient Gender: F                HR:           61 bpm. Exam Location:  Inpatient Procedure: 2D Echo, Color Doppler and Cardiac Doppler Indications:    I50.31 Acute diastolic (congestive) heart failure  History:        Patient has prior history of Echocardiogram examinations, most                 recent 03/17/2021. Risk Factors:Hypertension, Diabetes,                 Dyslipidemia and Sleep Apnea.  Sonographer:    Irving Burton Senior RDCS Referring Phys: Rosalyn Gess NORINS IMPRESSIONS  1. Left ventricular ejection fraction, by estimation, is 65 to 70%. The left ventricle has normal function. The left ventricle has no regional wall motion abnormalities. There is moderate asymmetric left ventricular hypertrophy of the septal segment. Left ventricular diastolic parameters are indeterminate. Elevated left ventricular end-diastolic pressure.  2. Right ventricular systolic function is normal. The right ventricular size is normal. There is  normal pulmonary artery systolic pressure. The estimated right ventricular systolic pressure is 18.5 mmHg.  3. The mitral valve is degenerative. Mild mitral valve regurgitation. No evidence of mitral stenosis.  4. The aortic valve has an indeterminant number of cusps. There is mild thickening of the aortic valve. Aortic valve regurgitation is trivial.  5. The inferior vena cava is normal in size with <50% respiratory variability, suggesting right atrial pressure of 8 mmHg. FINDINGS  Left Ventricle: Left ventricular ejection fraction, by estimation, is 65 to 70%. The left ventricle has normal function. The left ventricle has no regional wall motion abnormalities. The left ventricular internal cavity size was normal in size. There is  moderate asymmetric left ventricular hypertrophy of the septal segment. Left ventricular diastolic parameters are indeterminate. Elevated left ventricular end-diastolic pressure. Right Ventricle: The right ventricular size is normal. No increase in right ventricular wall thickness. Right ventricular systolic function is normal. There is normal pulmonary artery systolic pressure. The tricuspid regurgitant velocity is 1.62 m/s, and  with an assumed right atrial pressure of 8 mmHg, the estimated right ventricular systolic pressure is 18.5 mmHg. Left Atrium: Left atrial size was normal in size. Right Atrium: Right atrial size was normal in size. Pericardium: There is no evidence of pericardial effusion. Mitral Valve: The mitral valve is degenerative in appearance. There is mild thickening of the mitral valve leaflet(s). Mild mitral valve regurgitation. No evidence of mitral valve stenosis. Tricuspid Valve: The tricuspid valve is normal in  structure. Tricuspid valve regurgitation is trivial. No evidence of tricuspid stenosis. Aortic Valve: The aortic valve has an indeterminant number of cusps. There is mild thickening of the aortic valve. There is moderate aortic valve annular calcification.  Aortic valve regurgitation is trivial. Pulmonic Valve: The pulmonic valve was normal in structure. Pulmonic valve regurgitation is not visualized. Aorta: The aortic root is normal in size and structure and the ascending aorta was not well visualized. Venous: The inferior vena cava is normal in size with less than 50% respiratory variability, suggesting right atrial pressure of 8 mmHg. IAS/Shunts: No atrial level shunt detected by color flow Doppler.  LEFT VENTRICLE PLAX 2D LVIDd:         3.80 cm   Diastology LVIDs:         2.30 cm   LV e' medial:    4.13 cm/s LV PW:         1.10 cm   LV E/e' medial:  21.6 LV IVS:        1.30 cm   LV e' lateral:   7.40 cm/s LVOT diam:     2.10 cm   LV E/e' lateral: 12.1 LV SV:         75 LV SV Index:   43 LVOT Area:     3.46 cm  RIGHT VENTRICLE RV S prime:     9.25 cm/s TAPSE (M-mode): 2.0 cm LEFT ATRIUM             Index        RIGHT ATRIUM           Index LA diam:        3.00 cm 1.73 cm/m   RA Area:     13.80 cm LA Vol (A2C):   61.0 ml 35.14 ml/m  RA Volume:   29.00 ml  16.70 ml/m LA Vol (A4C):   42.3 ml 24.36 ml/m LA Biplane Vol: 51.9 ml 29.89 ml/m  AORTIC VALVE LVOT Vmax:   84.70 cm/s LVOT Vmean:  64.400 cm/s LVOT VTI:    0.216 m  AORTA Ao Root diam: 3.40 cm MITRAL VALVE                TRICUSPID VALVE MV Area (PHT): 2.19 cm     TR Peak grad:   10.5 mmHg MV Decel Time: 347 msec     TR Vmax:        162.00 cm/s MV E velocity: 89.30 cm/s MV A velocity: 116.00 cm/s  SHUNTS MV E/A ratio:  0.77         Systemic VTI:  0.22 m                             Systemic Diam: 2.10 cm Kardie Tobb DO Electronically signed by Thomasene Ripple DO Signature Date/Time: 07/28/2023/5:24:35 PM    Final    CT Head Wo Contrast  Result Date: 07/27/2023 CLINICAL DATA:  Bradycardia EXAM: CT HEAD WITHOUT CONTRAST TECHNIQUE: Contiguous axial images were obtained from the base of the skull through the vertex without intravenous contrast. RADIATION DOSE REDUCTION: This exam was performed according to the  departmental dose-optimization program which includes automated exposure control, adjustment of the mA and/or kV according to patient size and/or use of iterative reconstruction technique. COMPARISON:  None Available. FINDINGS: Brain: No acute intracranial hemorrhage. No focal mass lesion. No CT evidence of acute infarction. No midline shift or mass effect. No hydrocephalus. Basilar cisterns  are patent. There are periventricular and subcortical white matter hypodensities. Generalized cortical atrophy. Vascular: No hyperdense vessel or unexpected calcification. Skull: Normal. Negative for fracture or focal lesion. Sinuses/Orbits: Paranasal sinuses and mastoid air cells are clear. Orbits are clear. Other: None. IMPRESSION: 1. No acute intracranial findings. 2. Atrophy and white matter microvascular disease. Electronically Signed   By: Genevive Bi M.D.   On: 07/27/2023 13:35   DG Chest 2 View  Result Date: 07/27/2023 CLINICAL DATA:  Fatigue.  Weakness.  Bradycardia. EXAM: CHEST - 2 VIEW COMPARISON:  05/01/2023 FINDINGS: The heart size and mediastinal contours are within normal limits. Stable linear opacity in left lung base, consistent with scarring. No evidence of acute infiltrate or edema. Thoracic spine neurostimulator leads noted. IMPRESSION: No active cardiopulmonary disease. Electronically Signed   By: Danae Orleans M.D.   On: 07/27/2023 12:58    EKG: on arrival shows underlying NSR rate of 60, though with frequent PVCs in a pattern of bigeminy. 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  (personally reviewed)  Assessment/Plan:  Chronic bradycardia PVCs It is not clear that her HRs are significantly contributing to her functional status.  She is having PVCs here, though not sustained bigeminy as prior.  Apart form an episode of fatigue Sunday, she is otherwise  not symptomatic.  HR was 42 at that time checked on home BP machine, which may have correlated to a period of PVCs, as she has had no sustained bradycardia here.  We discussed at length the indications for PPM and at this juncture, it is not clear a PPM would significantly affect her functional status.  ? Consider mexitil at low dose to treat PVCs and follow vs watchful waiting, though worry about her ability to tolerate.   Chronic HFpEF Continue home medications  H/o PE On Xarelto.   Dr. Nelly Laurence to see. It is not clear at this juncture that PPM would provide significant benefit. No sustained bradycardia here, leading me to suspect that value of 42 at home was likely bradysphygmia in setting of PVCs.   For questions or updates, please contact [facility] Please consult [email or web address] for contact info under Cardiology/STEMI.  Dustin Flock, PA-C  07/29/2023 11:37 AM

## 2023-07-29 NOTE — Plan of Care (Signed)
  Problem: Health Behavior/Discharge Planning: Goal: Ability to manage health-related needs will improve Outcome: Progressing   Problem: Clinical Measurements: Goal: Will remain free from infection Outcome: Progressing Goal: Diagnostic test results will improve Outcome: Progressing Goal: Respiratory complications will improve Outcome: Progressing Goal: Cardiovascular complication will be avoided Outcome: Progressing   Problem: Nutrition: Goal: Adequate nutrition will be maintained Outcome: Progressing   Problem: Safety: Goal: Ability to remain free from injury will improve Outcome: Progressing

## 2023-07-29 NOTE — Progress Notes (Signed)
Mobility Specialist Progress Note:    07/29/23 1058  Mobility  Activity Ambulated with assistance in hallway  Level of Assistance Contact guard assist, steadying assist  Assistive Device Front wheel walker  Distance Ambulated (ft) 350 ft  Activity Response Tolerated well  Mobility Referral Yes  $Mobility charge 1 Mobility  Mobility Specialist Start Time (ACUTE ONLY) 0947  Mobility Specialist Stop Time (ACUTE ONLY) [id number]  Mobility Specialist Time Calculation (min) (ACUTE ONLY) 11 min   Received pt in bed having no complaints and agreeable to mobility. Pt was asymptomatic throughout ambulation and returned to room w/o fault. Left in bed w/ call bell in reach and all needs met.   Thompson Grayer Mobility Specialist  Please contact vis Secure Chat or  Rehab Office [phone]

## 2023-07-31 ENCOUNTER — Telehealth: Payer: Self-pay | Admitting: *Deleted

## 2023-07-31 ENCOUNTER — Encounter: Payer: Self-pay | Admitting: *Deleted

## 2023-07-31 NOTE — Transitions of Care (Post Inpatient/ED Visit) (Signed)
07/31/2023  Name: Sherry Holloway MRN: [id number] DOB: December 22, 1927  Today's TOC FU Call Status: Today's TOC FU Call Status:: Successful TOC FU Call Completed TOC FU Call Complete Date: 07/31/23  Transition Care Management Follow-up Telephone Call Date of Discharge: 07/29/23 Discharge Facility: [facility]) Type of Discharge: Inpatient Admission Primary Inpatient Discharge Diagnosis:: symptomatic bradycardia without pacemaker insertion How have you been since you were released from the hospital?: Better (per daughter: "She is doing okay, overall better.  I am a cardiac nurse at [employer], and I look after all of her affairs and manage her medications.  I can't review medications today because I am at work and in charge.  Thanks for getting [place] this appoinment") Any questions or concerns?: No  Items Reviewed: Did you receive and understand the discharge instructions provided?: Yes Medications obtained,verified, and reconciled?: No (confirmed RN daughter/ caregiver manages medications and denies questions/ concerns around medications today; verified no medication changes at time of hospital discharge) Medications Not Reviewed Reasons:: Other: (daughter/ caregiver is at work and is unable to review medications today) Any new allergies since your discharge?: No Dietary orders reviewed?: Yes Type of Diet Ordered:: "Healthy as possible" Do you have support at home?: Yes People in Home: alone Name of Support/Comfort Primary Source: Reports resides alone; essentially independent in self-care activities; supportive local RN daughter assists as/ if needed/ indicated  Medications Reviewed Today: Medications Reviewed Today     Reviewed by Michaela Corner, RN (Registered Nurse) on 07/31/23 at 1016  Med List Status: <None>   Medication Order Taking? Sig Documenting Provider Last Dose Status Informant  acetaminophen (TYLENOL) 325 MG tablet [id number] No Take 352-650 mg by mouth every 6 (six) hours as  needed for mild pain or moderate pain. [provider] Past Week Active Child           Med Note Jonnie Kind Jul 31, 2023 10:16 AM) 07/31/23: daughter declines medication review during [facility] call: she is at work   bumetanide (BUMEX) 1 MG tablet [id number] No Take 0.5-1 tablets (0.5-1 mg total) by mouth daily as needed (swelling).  Patient taking differently: Take 0.5 mg by mouth daily.   Zola Button, Myrene Buddy R, DO 07/27/2023 0900 Active Child  CREON 36000-114000 units CPEP capsule [id number] No TAKE 1 CAPSULE THREE TIMES A DAY BEFORE MEALS  Patient taking differently: Take 36,000 Units by mouth 3 (three) times daily before meals.   Zola Button, Myrene Buddy R, DO 07/27/2023 0900 Active Child  docusate sodium (COLACE) 100 MG capsule [id number] No Take 1 capsule (100 mg total) by mouth 2 (two) times daily.  Patient taking differently: Take 200 mg by mouth daily.   Osvaldo Shipper, MD 07/27/2023 0900 Active Child  famciclovir (FAMVIR) 125 MG tablet [id number] No TAKE 1 TABLET DAILY Donato Schultz, DO 07/27/2023 0900 Active Child  glimepiride (AMARYL) 4 MG tablet [id number] No Take 1 tablet (4 mg total) by mouth 2 (two) times daily. Zola Button, Myrene Buddy R, DO 07/27/2023 0900 Active Child  glucose blood (FREESTYLE LITE) test strip [id number] No CHECK BLOOD SUGAR TWICE A DAY Donato Schultz, DO 07/27/2023 Active Child  JARDIANCE 25 MG TABS tablet [id number] No TAKE 1 TABLET DAILY BEFORE BREAKFAST Donato Schultz, DO 07/27/2023 0900 Active Child  latanoprost (XALATAN) 0.005 % ophthalmic solution [id number] No Place 1 drop into both eyes at bedtime. Zola Button, Myrene Buddy R, DO 07/27/2023 2100 Active Child  linaclotide (LINZESS) 145 MCG CAPS capsule [id number]  No TAKE 1 CAPSULE DAILY BEFORE BREAKFAST  Patient taking differently: Take 145 mcg by mouth daily before breakfast.   Zola Button, Yvonne R, DO 07/27/2023 0900 Active Child  mirtazapine (REMERON) 15 MG tablet [id number] No TAKE 1 TABLET AT  BEDTIME Donato Schultz, DO 07/27/2023 2100 Active Child  morphine (MS CONTIN) 30 MG 12 hr tablet [id number] No Take 30 mg by mouth every 12 (twelve) hours. [provider] 07/27/2023 0900 Active Child  Multiple Vitamin (MULTIVITAMIN WITH MINERALS) TABS tablet [id number] No Take 1 tablet by mouth daily with supper. [provider] 07/27/2023 2100 Active Child  omeprazole (PRILOSEC) 20 MG capsule [id number] No Take 1 capsule (20 mg total) by mouth 2 (two) times daily before a meal. Donato Schultz, DO 07/27/2023 0900 Active Child  senna (SENOKOT) 8.6 MG TABS tablet [id number] No Take 1 tablet (8.6 mg total) by mouth daily.  Patient taking differently: Take 1 tablet by mouth every evening.   Alba Cory, MD 07/27/2023 2100 Active Child  valsartan (DIOVAN) 40 MG tablet [id number] No TAKE 1 TABLET DAILY WITH BREAKFAST Seabron Spates R, DO 07/27/2023 0900 Active Child  XARELTO 20 MG TABS tablet [id number] No TAKE 1 TABLET DAILY WITH SUPPER, START ONLY AFTER THE STARTER PACK HAS BEEN COMPLETED  Patient taking differently: Take 20 mg by mouth daily with supper.   Zola Button, Grayling Congress, DO 07/26/2023 1800 Active Child            Home Care and Equipment/Supplies: Were Home Health Services Ordered?: Yes Name of Home Health Agency:: [facility]- PT [phone] Has Agency set up a time to come to your home?: Yes First Home Health Visit Date: 07/31/23 Any new equipment or medical supplies ordered?: No  Functional Questionnaire: Do you need assistance with bathing/showering or dressing?: No (daughter assists/ supervises as indicated) Do you need assistance with meal preparation?: Yes (daughter assists as indicated) Do you need assistance with eating?: No Do you have difficulty maintaining continence: No Do you need assistance with getting out of bed/getting out of a chair/moving?: No Do you have difficulty managing or taking your medications?: Yes (daughter  manages all aspects of medication management)  Follow up appointments reviewed: PCP Follow-up appointment confirmed?: Yes (care coordination outreach in real-time with scheduling care guide to successfully schedule hospital follow up PCP appointment 08/04/23) Date of PCP follow-up appointment?: 09/04/23 Follow-up Provider: PCP Specialist Hospital Follow-up appointment confirmed?: No Reason Specialist Follow-Up Not Confirmed: Patient has Specialist Provider Number and will Call for Appointment Do you need transportation to your follow-up appointment?: No Do you understand care options if your condition(s) worsen?: Yes-patient verbalized understanding  SDOH Interventions Today    Flowsheet Row Most Recent Value  SDOH Interventions   Food Insecurity Interventions Intervention Not Indicated  Transportation Interventions Intervention Not Indicated  [daughter provides transportation]      TOC Interventions Today    Flowsheet Row Most Recent Value  TOC Interventions   TOC Interventions Discussed/Reviewed TOC Interventions Discussed, Arranged PCP follow up within 7 days/Care Guide scheduled  [Daughter declines need for ongoing/ further care coordination outreach,  no care coordination needs identified at time of TOC call today- declines taking my number for care coordination needs in future-- daughter is a cardiac RN at [employer]]      Interventions Today    Flowsheet Row Most Recent Value  Chronic Disease   Chronic disease during today's visit Other  [symptomatic bradycardia]  General Interventions  General Interventions Discussed/Reviewed General Interventions Discussed, Doctor Visits, Durable Medical Equipment (DME)  Doctor Visits Discussed/Reviewed Doctor Visits Discussed, PCP, Specialist  Durable Medical Equipment (DME) Val Riles currently requiring/ using assistive devices - walker]  PCP/Specialist Visits Compliance with follow-up visit  Exercise Interventions    Exercise Discussed/Reviewed Exercise Discussed  [verified home health PT is currently active]  Nutrition Interventions   Nutrition Discussed/Reviewed Nutrition Discussed  Pharmacy Interventions   Pharmacy Dicussed/Reviewed Pharmacy Topics Discussed  Safety Interventions   Safety Discussed/Reviewed Safety Discussed, Fall Risk      Caryl Pina, RN, BSN, CCRN Alumnus RN CM Care Coordination/ Transition of Care- [facility] Care Management [phone]: direct office

## 2023-08-01 ENCOUNTER — Telehealth: Payer: Self-pay | Admitting: Family Medicine

## 2023-08-01 NOTE — Telephone Encounter (Signed)
Caller/Agency: [facility]) Callback Number: [phone] Requesting OT/PT/Skilled Nursing/Social Work/Speech Therapy: PT Frequency: 1 w 8

## 2023-08-04 ENCOUNTER — Encounter: Payer: Self-pay | Admitting: Family Medicine

## 2023-08-04 ENCOUNTER — Ambulatory Visit (INDEPENDENT_AMBULATORY_CARE_PROVIDER_SITE_OTHER): Payer: Medicare Other | Admitting: Family Medicine

## 2023-08-04 VITALS — BP 142/68 | HR 48 | Temp 98.3°F | Resp 18 | Ht 64.0 in | Wt 152.8 lb

## 2023-08-04 DIAGNOSIS — Z7984 Long term (current) use of oral hypoglycemic drugs: Secondary | ICD-10-CM

## 2023-08-04 DIAGNOSIS — R5383 Other fatigue: Secondary | ICD-10-CM | POA: Diagnosis not present

## 2023-08-04 DIAGNOSIS — R0602 Shortness of breath: Secondary | ICD-10-CM | POA: Diagnosis not present

## 2023-08-04 DIAGNOSIS — E1142 Type 2 diabetes mellitus with diabetic polyneuropathy: Secondary | ICD-10-CM | POA: Diagnosis not present

## 2023-08-04 DIAGNOSIS — E559 Vitamin D deficiency, unspecified: Secondary | ICD-10-CM | POA: Diagnosis not present

## 2023-08-04 DIAGNOSIS — E1169 Type 2 diabetes mellitus with other specified complication: Secondary | ICD-10-CM

## 2023-08-04 DIAGNOSIS — K769 Liver disease, unspecified: Secondary | ICD-10-CM

## 2023-08-04 DIAGNOSIS — E1165 Type 2 diabetes mellitus with hyperglycemia: Secondary | ICD-10-CM

## 2023-08-04 DIAGNOSIS — I2699 Other pulmonary embolism without acute cor pulmonale: Secondary | ICD-10-CM

## 2023-08-04 DIAGNOSIS — R001 Bradycardia, unspecified: Secondary | ICD-10-CM | POA: Diagnosis not present

## 2023-08-04 DIAGNOSIS — I10 Essential (primary) hypertension: Secondary | ICD-10-CM

## 2023-08-04 DIAGNOSIS — E785 Hyperlipidemia, unspecified: Secondary | ICD-10-CM | POA: Diagnosis not present

## 2023-08-04 LAB — POC URINALSYSI DIPSTICK (AUTOMATED)
Bilirubin, UA: NEGATIVE
Blood, UA: NEGATIVE
Glucose, UA: POSITIVE — AB
Ketones, UA: NEGATIVE
Leukocytes, UA: NEGATIVE
Nitrite, UA: NEGATIVE
Protein, UA: NEGATIVE
Spec Grav, UA: 1.01 (ref 1.010–1.025)
Urobilinogen, UA: 0.2 E.U./dL
pH, UA: 5 (ref 5.0–8.0)

## 2023-08-04 LAB — BRAIN NATRIURETIC PEPTIDE: Pro B Natriuretic peptide (BNP): 251 pg/mL — ABNORMAL HIGH (ref 0.0–100.0)

## 2023-08-04 NOTE — Assessment & Plan Note (Signed)
Lab Results  Component Value Date   HGBA1C 7.3 (H) 05/05/2023

## 2023-08-04 NOTE — Assessment & Plan Note (Signed)
Per cardiology Pt is exhausted

## 2023-08-04 NOTE — Telephone Encounter (Signed)
[facility]) called back to f/u on previous msg. Please call & advise them.

## 2023-08-04 NOTE — Progress Notes (Signed)
Established Patient Office Visit  Subjective   Patient ID: Sherry Holloway, female    DOB: 24-Aug-1928  Age: 89 y.o. MRN: [id number]  Chief Complaint  Patient presents with   Hospitalization Follow-up    Bradycardia,     HPI Discussed the use of AI scribe software for clinical note transcription with the patient, who gave verbal consent to proceed.  History of Present Illness   The patient, with a history of bradycardia and a pancreatic mass, presents with unusual fatigue. The fatigue was significant enough to warrant a hospital admission for bradycardia. However, despite the low heart rate, a pacemaker was not placed as the heart rate was not deemed slow enough. The patient's daughter reports that the patient's fatigue has been ongoing and unusual, even before the hospital admission. The patient's heart rate often reads in the mid to upper 40s. The patient also has a pancreatic mass, which appears to be stable. The patient's daughter reports that the patient occasionally has light-colored stools, which she is currently monitoring.      Patient Active Problem List   Diagnosis Date Noted   Symptomatic sinus bradycardia 07/27/2023   Bradycardia 05/02/2023   Dizziness 05/02/2023   At high risk for injury related to fall 02/06/2023   Balance disorder 01/30/2023   Vulvar rash 04/02/2022   Hyperlipidemia associated with type 2 diabetes mellitus (HCC) 04/02/2022   Rash and nonspecific skin eruption 02/19/2022   Chronic pain disorder 11/26/2021   Choledocholithiasis 11/25/2021   Personal history of esophageal cancer 08/26/2021   Weight loss, unintentional 08/26/2021   Type 2 diabetes mellitus with diabetic polyneuropathy, without long-term current use of insulin (HCC) 06/12/2021   Snoring 04/30/2021   At risk for central sleep apnea 04/30/2021   Acute pulmonary embolism (HCC) 03/29/2021   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) 03/19/2021   Other pulmonary embolism with acute cor  pulmonale, unspecified chronicity (HCC)    Subclavian arterial stenosis (HCC) 02/13/2021   Controlled type 2 diabetes mellitus with hyperglycemia, without long-term current use of insulin (HCC) 02/06/2021   Acquired trigger finger of left middle finger 01/09/2021   Acquired trigger finger of left ring finger 01/09/2021   Trigger finger of left hand 12/24/2020   Plantar wart 12/24/2020   Pancreatic mass 10/19/2020   Gastroesophageal reflux disease with esophagitis without hemorrhage 10/19/2020   Hypertension 10/19/2020   Diabetes mellitus without complication (HCC) 10/19/2020   Vaginal odor 10/19/2020   Hyperlipidemia 10/19/2020   OSA (obstructive sleep apnea) 10/19/2020   Chronic bilateral back pain 10/19/2020   Past Medical History:  Diagnosis Date   Arthritis    Choledocholithiasis    Diabetes mellitus without complication (HCC)    Diverticulitis    Esophageal cancer (HCC) dx'd 2020   GERD (gastroesophageal reflux disease)    Hyperlipidemia    Hypertension    OSA (obstructive sleep apnea)    Urine incontinence    Uterine cancer (HCC)    Past Surgical History:  Procedure Laterality Date   ABDOMINAL HYSTERECTOMY  1972   APPENDECTOMY     BILIARY DILATION  03/05/2021   Procedure: BILIARY DILATION;  Surgeon: Lemar Lofty., MD;  Location: [facility] ENDOSCOPY;  Service: Gastroenterology;;   BILIARY DILATION  11/27/2021   Procedure: BILIARY DILATION;  Surgeon: Rachael Fee, MD;  Location: [facility] ENDOSCOPY;  Service: Endoscopy;;   BILIARY DILATION  02/11/2022   Procedure: BILIARY DILATION;  Surgeon: Lemar Lofty., MD;  Location: [facility] ENDOSCOPY;  Service: Gastroenterology;;  BILIARY STENT PLACEMENT  11/27/2021   Procedure: BILIARY STENT PLACEMENT;  Surgeon: Rachael Fee, MD;  Location: [facility] ENDOSCOPY;  Service: Endoscopy;;   BIOPSY  03/05/2021   Procedure: BIOPSY;  Surgeon: Lemar Lofty., MD;  Location: [facility]OSCOPY;  Service: Gastroenterology;;   BIOPSY   11/27/2021   Procedure: BIOPSY;  Surgeon: Rachael Fee, MD;  Location: [facility] ENDOSCOPY;  Service: Endoscopy;;   CHOLECYSTECTOMY     ENDOSCOPIC RETROGRADE CHOLANGIOPANCREATOGRAPHY (ERCP) WITH PROPOFOL N/A 03/05/2021   Procedure: ENDOSCOPIC RETROGRADE CHOLANGIOPANCREATOGRAPHY (ERCP) WITH PROPOFOL;  Surgeon: Lemar Lofty., MD;  Location: [facility] ENDOSCOPY;  Service: Gastroenterology;  Laterality: N/A;   ENDOSCOPIC RETROGRADE CHOLANGIOPANCREATOGRAPHY (ERCP) WITH PROPOFOL N/A 11/27/2021   Procedure: ENDOSCOPIC RETROGRADE CHOLANGIOPANCREATOGRAPHY (ERCP) WITH PROPOFOL;  Surgeon: Rachael Fee, MD;  Location: [facility] ENDOSCOPY;  Service: Endoscopy;  Laterality: N/A;  possible EGD prior   ENDOSCOPIC RETROGRADE CHOLANGIOPANCREATOGRAPHY (ERCP) WITH PROPOFOL N/A 02/11/2022   Procedure: ENDOSCOPIC RETROGRADE CHOLANGIOPANCREATOGRAPHY (ERCP) WITH PROPOFOL;  Surgeon: Meridee Score Netty Starring., MD;  Location: [facility] ENDOSCOPY;  Service: Gastroenterology;  Laterality: N/A;   ESOPHAGOGASTRODUODENOSCOPY N/A 11/27/2021   Procedure: ESOPHAGOGASTRODUODENOSCOPY (EGD);  Surgeon: Rachael Fee, MD;  Location: [facility] ENDOSCOPY;  Service: Endoscopy;  Laterality: N/A;   ESOPHAGOGASTRODUODENOSCOPY (EGD) WITH PROPOFOL N/A 03/05/2021   Procedure: ESOPHAGOGASTRODUODENOSCOPY (EGD) WITH PROPOFOL;  Surgeon: Meridee Score Netty Starring., MD;  Location: [facility] ENDOSCOPY;  Service: Gastroenterology;  Laterality: N/A;   EUS N/A 03/05/2021   Procedure: UPPER ENDOSCOPIC ULTRASOUND (EUS) RADIAL;  Surgeon: Meridee Score Netty Starring., MD;  Location: [facility] ENDOSCOPY;  Service: Gastroenterology;  Laterality: N/A;   LITHOTRIPSY  02/11/2022   Procedure: LITHOTRIPSY;  Surgeon: Mansouraty, Netty Starring., MD;  Location: [facility] ENDOSCOPY;  Service: Gastroenterology;;   LUMBAR LAMINECTOMY     REMOVAL OF STONES  03/05/2021   Procedure: REMOVAL OF STONES;  Surgeon: Lemar Lofty., MD;  Location: [facility] ENDOSCOPY;  Service: Gastroenterology;;   REMOVAL OF STONES   11/27/2021   Procedure: REMOVAL OF STONES;  Surgeon: Rachael Fee, MD;  Location: [facility] END[facility]COPY;  Service: Endoscopy;;   REMOVAL OF STONES  02/11/2022   Procedure: REMOVAL OF STONES;  Surgeon: Lemar Lofty., MD;  Location: [facility] ENDOSCOPY;  Service: Gastroenterology;;   SPINAL CORD STIMULATOR IMPLANT     Not active any more.   SPYGLASS CHOLANGIOSCOPY N/A 03/05/2021   Procedure: SPYGLASS CHOLANGIOSCOPY;  Surgeon: Lemar Lofty., MD;  Location: [facility] ENDOSCOPY;  Service: Gastroenterology;  Laterality: N/A;   SPYGLASS CHOLANGIOSCOPY N/A 02/11/2022   Procedure: SPYGLASS CHOLANGIOSCOPY;  Surgeon: Lemar Lofty., MD;  Location: [facility] ENDOSCOPY;  Service: Gastroenterology;  Laterality: N/A;   SPYGLASS LITHOTRIPSY N/A 03/05/2021   Procedure: [id number] LITHOTRIPSY;  Surgeon: Lemar Lofty., MD;  Location: [facility] ENDOSCOPY;  Service: Gastroenterology;  Laterality: N/A;   SPYGLASS LITHOTRIPSY N/A 02/11/2022   Procedure: [id number] LITHOTRIPSY;  Surgeon: Lemar Lofty., MD;  Location: [facility] ENDOSCOPY;  Service: Gastroenterology;  Laterality: N/A;   STENT REMOVAL  02/11/2022   Procedure: STENT REMOVAL;  Surgeon: Lemar Lofty., MD;  Location: [facility] ENDOSCOPY;  Service: Gastroenterology;;   TONSILLECTOMY     TOTAL KNEE ARTHROPLASTY Right    Social History   Tobacco Use   Smoking status: Former    Current packs/day: 0.00    Average packs/day: 1 pack/day for 35.0 years (35.0 ttl pk-yrs)    Types: Cigarettes    Start date: 01/17/1954    Quit date: 01/17/1989    Years since quitting: 34.5   Smokeless tobacco: Never  Vaping Use  Vaping status: Never Used  Substance Use Topics   Alcohol use: Not Currently   Drug use: Never   Social History   Socioeconomic History   Marital status: Widowed    Spouse name: Not on file   Number of children: 4   Years of education: Not on file   Highest education level: Not on file  Occupational History   Occupation: Retired Lawyer   Tobacco Use   Smoking status: Former    Current packs/day: 0.00    Average packs/day: 1 pack/day for 35.0 years (35.0 ttl pk-yrs)    Types: Cigarettes    Start date: 01/17/1954    Quit date: 01/17/1989    Years since quitting: 34.5   Smokeless tobacco: Never  Vaping Use   Vaping status: Never Used  Substance and Sexual Activity   Alcohol use: Not Currently   Drug use: Never   Sexual activity: Not Currently  Other Topics Concern   Not on file  Social History Narrative   Just moved from [place]--- and moved into an apt 2.4 miles from her daughter    Social Determinants of Health   Financial Resource Strain: Low Risk  (04/02/2022)   Overall Financial Resource Strain (CARDIA)    Difficulty of Paying Living Expenses: Not hard at all  Food Insecurity: No Food Insecurity (07/31/2023)   Hunger Vital Sign    Worried About Running Out of Food in the Last Year: Never true    Ran Out of Food in the Last Year: Never true  Transportation Needs: No Transportation Needs (07/31/2023)   PRAPARE - Administrator, Civil Service (Medical): No    Lack of Transportation (Non-Medical): No  Physical Activity: Sufficiently Active (04/02/2022)   Exercise Vital Sign    Days of Exercise per Week: 5 days    Minutes of Exercise per Session: 60 min  Stress: No Stress Concern Present (04/02/2022)   [employer] of Occupational Health - Occupational Stress Questionnaire    Feeling of Stress : Not at all  Social Connections: Moderately Isolated (04/02/2022)   Social Connection and Isolation Panel [NHANES]    Frequency of Communication with Friends and Family: More than three times a week    Frequency of Social Gatherings with Friends and Family: Three times a week    Attends Religious Services: More than 4 times per year    Active Member of Clubs or Organizations: No    Attends Banker Meetings: Never    Marital Status: Widowed  Intimate Partner Violence: Not At Risk (07/27/2023)    Humiliation, Afraid, Rape, and Kick questionnaire    Fear of Current or Ex-Partner: No    Emotionally Abused: No    Physically Abused: No    Sexually Abused: No   Family Status  Relation Name Status   Mother  Deceased   Father  Deceased   Sister  Deceased   MGM  Deceased   MGF  Deceased   PGM  Deceased   PGF  Deceased   Son  Deceased   Sister  Deceased   Sister  Deceased   Sister  Deceased   Sister  Deceased   Sister  Deceased   Sister  Deceased   Daughter  Alive   Son  Alive   Daughter  Alive  No partnership data on file   Family History  Problem Relation Age of Onset   Diabetes Mother    Heart disease Father    Lung cancer Sister  Lung cancer Son    Hypertension Son    Hypertension Daughter    Diabetes Son    Hypertension Son    Diabetes Daughter    Hypertension Daughter    Allergies  Allergen Reactions   Gramicidin Other (See Comments)    Unknown reaction type   Sulfa Antibiotics Rash   Wound Dressing Adhesive Rash      Review of Systems  Constitutional:  Negative for chills, fever and malaise/fatigue.  HENT:  Negative for congestion and hearing loss.   Eyes:  Negative for blurred vision and discharge.  Respiratory:  Negative for cough, sputum production and shortness of breath.   Cardiovascular:  Negative for chest pain, palpitations and leg swelling.  Gastrointestinal:  Negative for abdominal pain, blood in stool, constipation, diarrhea, heartburn, nausea and vomiting.  Genitourinary:  Negative for dysuria, frequency, hematuria and urgency.  Musculoskeletal:  Negative for back pain, falls and myalgias.  Skin:  Negative for rash.  Neurological:  Negative for dizziness, sensory change, loss of consciousness, weakness and headaches.  Endo/Heme/Allergies:  Negative for environmental allergies. Does not bruise/bleed easily.  Psychiatric/Behavioral:  Negative for depression and suicidal ideas. The patient is not nervous/anxious and does not have insomnia.        Objective:     BP (!) 142/68 (BP Location: Left Arm, Patient Position: Sitting, Cuff Size: Normal)   Pulse (!) 48   Temp 98.3 F (36.8 C) (Oral)   Resp 18   Ht 5\' 4"  (1.626 m)   Wt 152 lb 12.8 oz (69.3 kg)   SpO2 98%   BMI 26.23 kg/m  BP Readings from Last 3 Encounters:  08/04/23 (!) 142/68  07/29/23 (!) 82/53  05/05/23 (!) 138/46   Wt Readings from Last 3 Encounters:  08/04/23 152 lb 12.8 oz (69.3 kg)  07/27/23 151 lb (68.5 kg)  05/05/23 151 lb 9.6 oz (68.8 kg)   SpO2 Readings from Last 3 Encounters:  08/04/23 98%  07/29/23 96%  05/05/23 98%      Physical Exam Vitals and nursing note reviewed.      Results for orders placed or performed in visit on 08/04/23  POCT Urinalysis Dipstick (Automated)  Result Value Ref Range   Color, UA yellow    Clarity, UA clear    Glucose, UA Positive (A) Negative   Bilirubin, UA negative    Ketones, UA negative    Spec Grav, UA 1.010 1.010 - 1.025   Blood, UA negative    pH, UA 5.0 5.0 - 8.0   Protein, UA Negative Negative   Urobilinogen, UA 0.2 0.2 or 1.0 E.U./dL   Nitrite, UA negative    Leukocytes, UA Negative Negative    Last CBC Lab Results  Component Value Date   WBC 7.0 07/27/2023   HGB 11.7 (L) 07/27/2023   HCT 36.9 07/27/2023   MCV 88.5 07/27/2023   MCH 28.1 07/27/2023   RDW 14.5 07/27/2023   PLT 178 07/27/2023   Last metabolic panel Lab Results  Component Value Date   GLUCOSE 173 (H) 07/27/2023   NA 139 07/27/2023   K 4.1 07/27/2023   CL 110 07/27/2023   CO2 22 07/27/2023   BUN 26 (H) 07/27/2023   CREATININE 0.92 07/27/2023   GFRNONAA 58 (L) 07/27/2023   CALCIUM 9.2 07/27/2023   PHOS 2.5 11/28/2021   PROT 6.5 07/27/2023   ALBUMIN 3.3 (L) 07/27/2023   BILITOT 0.4 07/27/2023   ALKPHOS 71 07/27/2023   AST 16 07/27/2023   ALT  21 07/27/2023   ANIONGAP 7 07/27/2023   Last lipids Lab Results  Component Value Date   CHOL 135 10/02/2022   HDL 56.90 10/02/2022   LDLCALC 57 10/02/2022    LDLDIRECT 71.0 04/02/2022   TRIG 109.0 10/02/2022   CHOLHDL 2 10/02/2022   Last hemoglobin A1c Lab Results  Component Value Date   HGBA1C 7.3 (H) 05/05/2023   Last thyroid functions Lab Results  Component Value Date   TSH 2.056 07/27/2023   Last vitamin D No results found for: "25OHVITD2", "25OHVITD3", "VD25OH" Last vitamin B12 and Folate No results found for: "VITAMINB12", "FOLATE"  The ASCVD Risk score (Arnett DK, et al., 2019) failed to calculate for the following reasons:   The 2019 ASCVD risk score is only valid for ages 55 to 13    Assessment & Plan:   Problem List Items Addressed This Visit       Unprioritized   Hypertension   Hyperlipidemia   Relevant Orders   Lipid panel   CBC with Differential/Platelet   Comprehensive metabolic panel   Type 2 diabetes mellitus with diabetic polyneuropathy, without long-term current use of insulin (HCC)    hgba1c acceptable, minimize simple carbs. Increase exercise as tolerated. Continue current meds       Relevant Orders   Comprehensive metabolic panel   Hemoglobin A1c   Microalbumin / creatinine urine ratio   Hyperlipidemia associated with type 2 diabetes mellitus (HCC)   Controlled type 2 diabetes mellitus with hyperglycemia, without long-term current use of insulin (HCC)    Lab Results  Component Value Date   HGBA1C 7.3 (H) 05/05/2023         Bradycardia - Primary    Per cardiology Pt is exhausted        Relevant Orders   POCT Urinalysis Dipstick (Automated) (Completed)   B Nat Peptide   Acute pulmonary embolism (HCC)    On Xaralto        Other Visit Diagnoses     Other fatigue       Relevant Orders   Vitamin B12   POCT Urinalysis Dipstick (Automated) (Completed)   B Nat Peptide   Vitamin D deficiency       Relevant Orders   VITAMIN D 25 Hydroxy (Vit-D Deficiency, Fractures)   Disease of liver       Relevant Orders   POCT Urinalysis Dipstick (Automated) (Completed)   SOB (shortness of  breath)       Relevant Orders   B Nat Peptide     Assessment and Plan    Bradycardia with PVCs Patient recently hospitalized for bradycardia. No pacemaker placed as heart rate deemed not slow enough. Patient experiencing significant fatigue, possibly related to low heart rate. Follow-up with electrophysiology (EP) scheduled. -Continue monitoring symptoms and heart rate. -Attend scheduled EP appointment.  Possible Vitamin Deficiencies Considering potential vitamin deficiencies as a cause for fatigue. -Order labs for B12, D, and thyroid levels.  Mild Anemia Hemoglobin 11.7, slightly anemic. -Monitor hemoglobin levels.  Pancreatic Mass Stable, no recent changes. -Continue monitoring.  Urinary Tract Some bacteria present in recent urine sample, possible protein spillage. -Repeat urine sample for analysis.  BNP Elevation BNP slightly elevated at 348, indicating possible heart issue. -Repeat BNP test.  Peripheral Edema Mild swelling in ankles, more pronounced in right ankle. -Monitor for changes or increase in swelling.        No follow-ups on file.    Donato Schultz, DO

## 2023-08-04 NOTE — Assessment & Plan Note (Signed)
hgba1c acceptable, minimize simple carbs. Increase exercise as tolerated. Continue current meds 

## 2023-08-04 NOTE — Assessment & Plan Note (Signed)
On Xaralto

## 2023-08-05 LAB — CBC WITH DIFFERENTIAL/PLATELET
Basophils Absolute: 0 10*3/uL (ref 0.0–0.1)
Basophils Relative: 0.5 % (ref 0.0–3.0)
Eosinophils Absolute: 0.1 10*3/uL (ref 0.0–0.7)
Eosinophils Relative: 1.4 % (ref 0.0–5.0)
HCT: 37.5 % (ref 36.0–46.0)
Hemoglobin: 12 g/dL (ref 12.0–15.0)
Lymphocytes Relative: 21.9 % (ref 12.0–46.0)
Lymphs Abs: 1.4 10*3/uL (ref 0.7–4.0)
MCHC: 32 g/dL (ref 30.0–36.0)
MCV: 87.8 fl (ref 78.0–100.0)
Monocytes Absolute: 0.3 10*3/uL (ref 0.1–1.0)
Monocytes Relative: 5.3 % (ref 3.0–12.0)
Neutro Abs: 4.6 10*3/uL (ref 1.4–7.7)
Neutrophils Relative %: 70.9 % (ref 43.0–77.0)
Platelets: 184 10*3/uL (ref 150.0–400.0)
RBC: 4.27 Mil/uL (ref 3.87–5.11)
RDW: 15.1 % (ref 11.5–15.5)
WBC: 6.5 10*3/uL (ref 4.0–10.5)

## 2023-08-05 LAB — LIPID PANEL
Cholesterol: 134 mg/dL (ref 0–200)
HDL: 51.1 mg/dL (ref >39.00)
LDL Cholesterol: 44 mg/dL (ref 0–99)
NonHDL: 83.24
Total CHOL/HDL Ratio: 3
Triglycerides: 195 mg/dL — ABNORMAL HIGH (ref 0.0–149.0)
VLDL: 39 mg/dL (ref 0.0–40.0)

## 2023-08-05 LAB — COMPREHENSIVE METABOLIC PANEL
ALT: 16 U/L (ref 0–35)
AST: 11 U/L (ref 0–37)
Albumin: 3.6 g/dL (ref 3.5–5.2)
Alkaline Phosphatase: 81 U/L (ref 39–117)
BUN: 24 mg/dL — ABNORMAL HIGH (ref 6–23)
CO2: 26 meq/L (ref 19–32)
Calcium: 9 mg/dL (ref 8.4–10.5)
Chloride: 105 meq/L (ref 96–112)
Creatinine, Ser: 0.92 mg/dL (ref 0.40–1.20)
GFR: 53.68 mL/min — ABNORMAL LOW (ref >60.00)
Glucose, Bld: 132 mg/dL — ABNORMAL HIGH (ref 70–99)
Potassium: 4.1 meq/L (ref 3.5–5.1)
Sodium: 139 meq/L (ref 135–145)
Total Bilirubin: 0.3 mg/dL (ref 0.2–1.2)
Total Protein: 6.1 g/dL (ref 6.0–8.3)

## 2023-08-05 LAB — MICROALBUMIN / CREATININE URINE RATIO
Creatinine,U: 30.6 mg/dL
Microalb Creat Ratio: 9.3 mg/g (ref 0.0–30.0)
Microalb, Ur: 2.9 mg/dL — ABNORMAL HIGH (ref 0.0–1.9)

## 2023-08-05 LAB — VITAMIN B12: Vitamin B-12: 239 pg/mL (ref 211–911)

## 2023-08-05 LAB — VITAMIN D 25 HYDROXY (VIT D DEFICIENCY, FRACTURES): VITD: 24.2 ng/mL — ABNORMAL LOW (ref 30.00–100.00)

## 2023-08-05 LAB — HEMOGLOBIN A1C: Hgb A1c MFr Bld: 7.2 % — ABNORMAL HIGH (ref 4.6–6.5)

## 2023-08-05 NOTE — Telephone Encounter (Signed)
Verbal given 

## 2023-08-06 ENCOUNTER — Other Ambulatory Visit: Payer: Self-pay | Admitting: Family Medicine

## 2023-08-06 DIAGNOSIS — K5901 Slow transit constipation: Secondary | ICD-10-CM

## 2023-08-07 ENCOUNTER — Telehealth: Payer: Self-pay | Admitting: Family Medicine

## 2023-08-07 NOTE — Telephone Encounter (Signed)
Rhonda with [facility] called to request the last office visit note so they can get diagnosis for plan of care for Medicare. Please fax #[phone] / Attn: Bjorn Loser

## 2023-08-07 NOTE — Progress Notes (Signed)
Cardiology Clinic Note   Date: 08/08/2023 ID: Anslie, Wecker 1928/11/05, MRN [id number]  Primary Cardiologist:  Thurmon Fair, MD  Patient Profile    Sherry Holloway is a 89 y.o. female who presents to the clinic today for hospital follow up.     Past medical history significant for: Chronic diastolic heart failure. Echo 07/28/2023: EF 65 to 70%.  Moderate asymmetric LVH of the septal segment.  Indeterminate diastolic parameters.  Normal RV function.  Normal PA pressure.  Degenerative mitral valve with mild MR. Mild thickening of aortic valve with trivial AI.  Palpitations/PVCs. 3-day ZIO 05/16/2023: Dominant rhythm normal sinus rhythm normal circadian variation.  Very frequent PVCs (11%).  No evidence of V. tach.  Rare PACs and rare 4-5 beat runs of ectopic atrial tachycardia.  1 triggered event for lightheadedness with rare PVCs seen around that time. Bradycardia. Carotid artery disease/Subclavian arterial stenosis. Carotid duplex 02/13/2021: Bilateral ICA 1 to 39%.  Bilateral ECA > 50%.  Left subclavian artery stenotic. Hypertension. Hyperlipidemia. Lipid panel 08/04/2023: LDL 44, HDL 51, TG 195, total 134. PE. T2DM. OSA. GERD. Esophageal cancer     History of Present Illness    Sherry Holloway was first evaluated by Dr. Royann Shivers on 05/01/2023 during ED visit for dizziness and abnormal EKG.  Patient presented to the ED on 05/01/2023 per PCP for ongoing dizziness x 3 weeks and abnormal EKG.  Dr. Royann Shivers reviewed the EKG which showed ventricular bigeminy felt to possibly be causing bradycardia.  There was evidence of conduction abnormalities (RBBB and LAFB present since 2022).  He recommended outpatient arrhythmia monitor and follow-up in clinic.  Deferred empirical suppression of PVCs with beta-blockers until clarification that she is not having episodes of high-grade AV block.  Patient wore a 3-day ZIO monitor which showed 11% PVC burden and no VT.  Seen in follow-up by Dr.  Eldridge Dace on 05/05/2023 with no further episodes of dizziness since ED visit.  It was felt given patient's age caution should be used when considering medications like beta-blocker.  Recommendation to stay hydrated and consider other causes of dizziness prior to starting BB.  Patient presented to the ED at [facility] with her daughter on 07/27/2023 with bradycardia.  Patient and daughter reported over the last couple of weeks she has been fatigued with slower mentation.  Patient reported feeling foggy.  She denied headaches, weakness, numbness, vision changes, chest pain, shortness of breath.  No syncope or falls.  No recent fevers, abdominal pain, vomiting or diarrhea.  Some worsening lower extremity edema that was new.  Troponin negative x 2.  BNP 334.  TSH normal at 2.056.  Patient transferred to [facility] for admission.  Echo showed normal LV/RV function with normal PA pressure and no significant valvular abnormalities.  Patient was evaluated by EP.  It was felt medications to suppress PVCs but also decrease heart rate which is low at baseline.  Patient reported she is not interested in having a pacemaker placed at this time and there is a question of pacemaker with provide significant benefit.  It was felt heart rate readings in the 40s at home likely bradysphygmia  in setting of PVCs.  Patient was discharged on 07/29/2023.  Today, patient is accompanied by her daughter. She is doing well since discharge from hospital. She still feels some fatigue but it is improved from prior to hospital admission. She has been working with physical therapy once a week and she would like to resume  her walks going about 3 blocks with a neighbor. She reports lower extremity edema is at baseline. She takes 1/2 tablet of Bumex daily. Legs are normal in the morning and edema progresses throughout the day. She denies shortness of breath or DOE. She lives alone with her daughter close by and is able to do ADLs and light  household activities independently. She is good about staying hydrated. Daughter wonders if patient would benefit from a longer heart monitor. Recent lab work from PCP showed low normal B12 and low vitamin D. This could be contributing to some of patient's fatigue. Her daughter is going to contact PCP to see what should be done for this.     ROS: All other systems reviewed and are otherwise negative except as noted in History of Present Illness.  Studies Reviewed       EKG not ordered today.   Risk Assessment/Calculations      HYPERTENSION CONTROL Vitals:   08/08/23 1350 08/08/23 1728  BP: (!) 160/46 (!) 148/58    The patient's blood pressure is elevated above target today.  In order to address the patient's elevated BP: Blood pressure will be monitored at home to determine if medication changes need to be made.           Physical Exam    VS:  BP (!) 160/46   Pulse 60   Ht 5\' 4"  (1.626 m)   Wt 154 lb (69.9 kg)   SpO2 95%   BMI 26.43 kg/m  , BMI Body mass index is 26.43 kg/m.  GEN: Well nourished, well developed, in no acute distress. Neck: No JVD or carotid bruits. Cardiac:  RRR. Occasional extrasystole. No murmurs. No rubs or gallops.   Respiratory:  Respirations regular and unlabored. Clear to auscultation without rales, wheezing or rhonchi. GI: Soft, nontender, nondistended. Extremities: Radials 2+ on the right and absent on the left. DP/PT 2+ and equal bilaterally. No clubbing or cyanosis. Mild edema bilateral lower extremities.   Skin: Warm and dry, no rash. Neuro: Strength intact.  Assessment & Plan    Bradycardia/PVCs/fatigue.  3-day ZIO May 2024 showed 11% PVC burden.  Patient with recent hospital admission for symptomatic bradycardia.  Patient evaluated by EP and felt not to be a candidate for pacemaker.  Patient reports she would be willing to get a pacemaker if it was indicated. Patient continues to have bradycardia and fatigue. Fatigue is improving since  hospital discharge. She has been working with physical therapy once a week with good tolerance. She would like to resume walking about 3 blocks with her neighbor. She lives alone with her daughter close by and is independent with ADLs and household activities. HR today 60 bpm. RRR with occasional extrasystole on exam today. Discussed repeating heart monitor for a longer duration. I do not see the utility in that at this point. Daughter and patient are in agreement. Patient is instructed to begin walking slowly going a shorter distance than usual and progressing as tolerated.  Chronic diastolic heart failure.  Echo August 2024 showed normal LV/RV function, moderate LVH, normal PA pressure without significant valvular abnormalities.  Patient denies shortness of breath or DOE. She has mild lower extremity edema that is normal in the morning and progresses throughout the day. She does not elevate her legs often throughout the day. She does not wear compression socks.  Euvolemic and well compensated on exam. Continue valsartan and Bumex.  Hypertension: BP today 160/46 on intake and 148/58 on my recheck.  Patient denies headaches, dizziness or vision changes. Continue valsartan.  PE. Patient on Xarelto managed by PCP. She denies spontaneous bleeding concerns. Continue to manage with PCP.   Disposition: Return in 2 months or sooner as needed.          Signed, Etta Grandchild. Mihail Prettyman, DNP, NP-C

## 2023-08-07 NOTE — Telephone Encounter (Signed)
Last note faxed

## 2023-08-08 ENCOUNTER — Encounter: Payer: Self-pay | Admitting: Family Medicine

## 2023-08-08 ENCOUNTER — Encounter: Payer: Self-pay | Admitting: Student

## 2023-08-08 ENCOUNTER — Ambulatory Visit: Payer: Medicare Other | Admitting: Student

## 2023-08-08 VITALS — BP 148/58 | HR 60 | Ht 64.0 in | Wt 154.0 lb

## 2023-08-08 DIAGNOSIS — I2782 Chronic pulmonary embolism: Secondary | ICD-10-CM | POA: Diagnosis present

## 2023-08-08 DIAGNOSIS — I493 Ventricular premature depolarization: Secondary | ICD-10-CM | POA: Insufficient documentation

## 2023-08-08 DIAGNOSIS — R001 Bradycardia, unspecified: Secondary | ICD-10-CM | POA: Insufficient documentation

## 2023-08-08 DIAGNOSIS — R5383 Other fatigue: Secondary | ICD-10-CM | POA: Diagnosis not present

## 2023-08-08 DIAGNOSIS — I10 Essential (primary) hypertension: Secondary | ICD-10-CM | POA: Diagnosis present

## 2023-08-08 DIAGNOSIS — I5032 Chronic diastolic (congestive) heart failure: Secondary | ICD-10-CM | POA: Insufficient documentation

## 2023-08-08 NOTE — Patient Instructions (Signed)
Medication Instructions:  Your physician recommends that you continue on your current medications as directed. Please refer to the Current Medication list given to you today.  *If you need a refill on your cardiac medications before your next appointment, please call your pharmacy*   Lab Work: NONE ordered at this time of appointment     Testing/Procedures: NONE ordered at this time of appointment    Follow-Up: At [facility], you and your health needs are our priority.  As part of our continuing mission to provide you with exceptional heart care, we have created designated Provider Care Teams.  These Care Teams include your primary Cardiologist (physician) and Advanced Practice Providers (APPs -  Physician Assistants and Nurse Practitioners) who all work together to provide you with the care you need, when you need it.  We recommend signing up for the patient portal called "MyChart".  Sign up information is provided on this After Visit Summary.  MyChart is used to connect with patients for Virtual Visits (Telemedicine).  Patients are able to view lab/test results, encounter notes, upcoming appointments, etc.  Non-urgent messages can be sent to your provider as well.   To learn more about what you can do with MyChart, go to [email or web address].    Your next appointment:   2 month(s)  Provider:   Thurmon Fair, MD

## 2023-08-22 ENCOUNTER — Emergency Department (HOSPITAL_BASED_OUTPATIENT_CLINIC_OR_DEPARTMENT_OTHER): Payer: Medicare Other

## 2023-08-22 ENCOUNTER — Other Ambulatory Visit: Payer: Self-pay

## 2023-08-22 ENCOUNTER — Emergency Department (HOSPITAL_BASED_OUTPATIENT_CLINIC_OR_DEPARTMENT_OTHER)
Admission: EM | Admit: 2023-08-22 | Discharge: 2023-08-22 | Disposition: A | Discharge status: Home / Self Care | Payer: Medicare Other | Attending: Emergency Medicine | Admitting: Emergency Medicine

## 2023-08-22 ENCOUNTER — Encounter (HOSPITAL_BASED_OUTPATIENT_CLINIC_OR_DEPARTMENT_OTHER): Payer: Self-pay

## 2023-08-22 DIAGNOSIS — Z8541 Personal history of malignant neoplasm of cervix uteri: Secondary | ICD-10-CM | POA: Diagnosis not present

## 2023-08-22 DIAGNOSIS — E119 Type 2 diabetes mellitus without complications: Secondary | ICD-10-CM | POA: Insufficient documentation

## 2023-08-22 DIAGNOSIS — W01198A Fall on same level from slipping, tripping and stumbling with subsequent striking against other object, initial encounter: Secondary | ICD-10-CM | POA: Insufficient documentation

## 2023-08-22 DIAGNOSIS — I10 Essential (primary) hypertension: Secondary | ICD-10-CM | POA: Diagnosis not present

## 2023-08-22 DIAGNOSIS — S0990XA Unspecified injury of head, initial encounter: Secondary | ICD-10-CM | POA: Insufficient documentation

## 2023-08-22 DIAGNOSIS — W19XXXA Unspecified fall, initial encounter: Secondary | ICD-10-CM

## 2023-08-22 DIAGNOSIS — Z8501 Personal history of malignant neoplasm of esophagus: Secondary | ICD-10-CM | POA: Insufficient documentation

## 2023-08-22 DIAGNOSIS — Z87891 Personal history of nicotine dependence: Secondary | ICD-10-CM | POA: Diagnosis not present

## 2023-08-22 MED ORDER — ACETAMINOPHEN 500 MG PO TABS
1000.0000 mg | ORAL_TABLET | Freq: Once | ORAL | Status: AC
  Administered 2023-08-22: 1000 mg via ORAL
  Filled 2023-08-22: qty 2

## 2023-08-22 NOTE — ED Provider Notes (Signed)
[place] [facility] Provider Note  CSN: [id number] Arrival date & time: 08/22/23 1457  Chief Complaint(s) Fall  HPI Sherry Holloway is a 89 y.o. female with history of diabetes, prior pulmonary embolism on Xarelto, GERD, presenting to the emergency department with fall.  Patient reports she fell this afternoon.  She was trying to make sure door was locked when she lost her balance and fell backwards.  Reports some scrapes on her elbows, mild bottom pain on the right, mild headache.  No loss of consciousness.  No nausea or vomiting.  No fevers or chills.  No chest pain or shortness of breath.  No abdominal pain.   Past Medical History Past Medical History:  Diagnosis Date   Arthritis    Choledocholithiasis    Diabetes mellitus without complication (HCC)    Diverticulitis    Esophageal cancer (HCC) dx'd 2020   GERD (gastroesophageal reflux disease)    Hyperlipidemia    Hypertension    OSA (obstructive sleep apnea)    Urine incontinence    Uterine cancer [facility])    Patient Active Problem List   Diagnosis Date Noted   Symptomatic sinus bradycardia 07/27/2023   Bradycardia 05/02/2023   Dizziness 05/02/2023   At high risk for injury related to fall 02/06/2023   Balance disorder 01/30/2023   Vulvar rash 04/02/2022   Hyperlipidemia associated with type 2 diabetes mellitus (HCC) 04/02/2022   Rash and nonspecific skin eruption 02/19/2022   Chronic pain disorder 11/26/2021   Choledocholithiasis 11/25/2021   Personal history of esophageal cancer 08/26/2021   Weight loss, unintentional 08/26/2021   Type 2 diabetes mellitus with diabetic polyneuropathy, without long-term current use of insulin (HCC) 06/12/2021   Snoring 04/30/2021   At risk for central sleep apnea 04/30/2021   Acute pulmonary embolism (HCC) 03/29/2021   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) 03/19/2021   Other pulmonary embolism with acute cor pulmonale, unspecified chronicity  (HCC)    Subclavian arterial stenosis (HCC) 02/13/2021   Controlled type 2 diabetes mellitus with hyperglycemia, without long-term current use of insulin (HCC) 02/06/2021   Acquired trigger finger of left middle finger 01/09/2021   Acquired trigger finger of left ring finger 01/09/2021   Trigger finger of left hand 12/24/2020   Plantar wart 12/24/2020   Pancreatic mass 10/19/2020   Gastroesophageal reflux disease with esophagitis without hemorrhage 10/19/2020   Hypertension 10/19/2020   Diabetes mellitus without complication (HCC) 10/19/2020   Vaginal odor 10/19/2020   Hyperlipidemia 10/19/2020   OSA (obstructive sleep apnea) 10/19/2020   Chronic bilateral back pain 10/19/2020   Home Medication(s) Prior to Admission medications   Medication Sig Start Date End Date Taking? Authorizing Provider  acetaminophen (TYLENOL) 325 MG tablet Take 352-650 mg by mouth every 6 (six) hours as needed for mild pain or moderate pain.    [provider]  bumetanide (BUMEX) 1 MG tablet Take 0.5-1 tablets (0.5-1 mg total) by mouth daily as needed (swelling). Patient taking differently: Take 0.5 mg by mouth daily. 08/06/22   Lowne [name], Yvonne R, DO  CREON 36000-114000 units CPEP capsule TAKE 1 CAPSULE THREE TIMES A DAY BEFORE MEALS Patient taking differently: Take 36,000 Units by mouth 3 (three) times daily before meals. 05/05/23   Donato Schultz, DO  docusate sodium (COLACE) 100 MG capsule Take 1 capsule (100 mg total) by mouth 2 (two) times daily. Patient taking differently: Take 200 mg by mouth daily. 03/18/21   Osvaldo Shipper, MD  famciclovir (FAMVIR) 125 MG tablet TAKE 1 TABLET DAILY 03/31/23   Zola Button, Grayling Congress, DO  glimepiride (AMARYL) 4 MG tablet Take 1 tablet (4 mg total) by mouth 2 (two) times daily. 07/03/23   Seabron Spates R, DO  glucose blood (FREESTYLE LITE) test strip CHECK BLOOD SUGAR TWICE A DAY 11/20/22   Zola Button, Grayling Congress, DO  JARDIANCE 25 MG TABS tablet TAKE 1  TABLET DAILY BEFORE BREAKFAST 02/04/23   Zola Button, Grayling Congress, DO  latanoprost (XALATAN) 0.005 % ophthalmic solution Place 1 drop into both eyes at bedtime. 08/24/21   Donato Schultz, DO  linaclotide [facility]) 145 MCG CAPS capsule Take 1 capsule (145 mcg total) by mouth daily before breakfast. 08/07/23   Zola Button, Grayling Congress, DO  mirtazapine (REMERON) 15 MG tablet TAKE 1 TABLET AT BEDTIME 04/16/23   Zola Button, Grayling Congress, DO  morphine (MS CONTIN) 30 MG 12 hr tablet Take 30 mg by mouth every 12 (twelve) hours. 01/21/22   [provider]  Multiple Vitamin (MULTIVITAMIN WITH MINERALS) TABS tablet Take 1 tablet by mouth daily with supper.    [provider]  omeprazole (PRILOSEC) 20 MG capsule Take 1 capsule (20 mg total) by mouth 2 (two) times daily before a meal. 08/06/22   Zola Button, Grayling Congress, DO  senna (SENOKOT) 8.6 MG TABS tablet Take 1 tablet (8.6 mg total) by mouth daily. Patient taking differently: Take 1 tablet by mouth every evening. 11/29/21   Regalado, Belkys A, MD  valsartan (DIOVAN) 40 MG tablet TAKE 1 TABLET DAILY WITH BREAKFAST 01/20/23   Lowne [name], Yvonne R, DO  XARELTO 20 MG TABS tablet TAKE 1 TABLET DAILY WITH SUPPER, START ONLY AFTER THE STARTER PACK HAS BEEN COMPLETED Patient taking differently: Take 20 mg by mouth daily with supper. 10/28/22   Donato Schultz, DO                                                                                                                                    Past Surgical History Past Surgical History:  Procedure Laterality Date   ABDOMINAL HYSTERECTOMY  1972   APPENDECTOMY     BILIARY DILATION  03/05/2021   Procedure: BILIARY DILATION;  Surgeon: Meridee Score Netty Starring., MD;  Location: [facility] ENDOSCOPY;  Service: Gastroenterology;;   BILIARY DILATION  11/27/2021   Procedure: BILIARY DILATION;  Surgeon: Rachael Fee, MD;  Location: [facility] ENDOSCOPY;  Service: Endoscopy;;   BILIARY DILATION  02/11/2022   Procedure: BILIARY  DILATION;  Surgeon: Lemar Lofty., MD;  Location: [facility] ENDOSCOPY;  Service: Gastroenterology;;   BILIARY STENT PLACEMENT  11/27/2021   Procedure: BILIARY STENT PLACEMENT;  Surgeon: Rachael Fee, MD;  Location: [facility] ENDOSCOPY;  Service: Endoscopy;;   BIOPSY  03/05/2021   Procedure: BIOPSY;  Surgeon: Lemar Lofty., MD;  Location: [facility] ENDOSCOPY;  Service: Gastroenterology;;   BIOPSY  11/27/2021  Procedure: BIOPSY;  Surgeon: Rachael Fee, MD;  Location: [facility] ENDOSCOPY;  Service: Endoscopy;;   CHOLECYSTECTOMY     ENDOSCOPIC RETROGRADE CHOLANGIOPANCREATOGRAPHY (ERCP) WITH PROPOFOL N/A 03/05/2021   Procedure: ENDOSCOPIC RETROGRADE CHOLANGIOPANCREATOGRAPHY (ERCP) WITH PROPOFOL;  Surgeon: Lemar Lofty., MD;  Location: [facility] ENDOSCOPY;  Service: Gastroenterology;  Laterality: N/A;   ENDOSCOPIC RETROGRADE CHOLANGIOPANCREATOGRAPHY (ERCP) WITH PROPOFOL N/A 11/27/2021   Procedure: ENDOSCOPIC RETROGRADE CHOLANGIOPANCREATOGRAPHY (ERCP) WITH PROPOFOL;  Surgeon: Rachael Fee, MD;  Location: [facility] ENDOSCOPY;  Service: Endoscopy;  Laterality: N/A;  possible EGD prior   ENDOSCOPIC RETROGRADE CHOLANGIOPANCREATOGRAPHY (ERCP) WITH PROPOFOL N/A 02/11/2022   Procedure: ENDOSCOPIC RETROGRADE CHOLANGIOPANCREATOGRAPHY (ERCP) WITH PROPOFOL;  Surgeon: Meridee Score Netty Starring., MD;  Location: [facility] ENDOSCOPY;  Service: Gastroenterology;  Laterality: N/A;   ESOPHAGOGASTRODUODENOSCOPY N/A 11/27/2021   Procedure: ESOPHAGOGASTRODUODENOSCOPY (EGD);  Surgeon: Rachael Fee, MD;  Location: [facility] ENDOSCOPY;  Service: Endoscopy;  Laterality: N/A;   ESOPHAGOGASTRODUODENOSCOPY (EGD) WITH PROPOFOL N/A 03/05/2021   Procedure: ESOPHAGOGASTRODUODENOSCOPY (EGD) WITH PROPOFOL;  Surgeon: Meridee Score Netty Starring., MD;  Location: [facility] ENDOSCOPY;  Service: Gastroenterology;  Laterality: N/A;   EUS N/A 03/05/2021   Procedure: UPPER ENDOSCOPIC ULTRASOUND (EUS) RADIAL;  Surgeon: Meridee Score Netty Starring., MD;  Location: [facility]  ENDOSCOPY;  Service: Gastroenterology;  Laterality: N/A;   LITHOTRIPSY  02/11/2022   Procedure: LITHOTRIPSY;  Surgeon: Mansouraty, Netty Starring., MD;  Location: [facility] ENDOSCOPY;  Service: Gastroenterology;;   LUMBAR LAMINECTOMY     REMOVAL OF STONES  03/05/2021   Procedure: REMOVAL OF STONES;  Surgeon: Lemar Lofty., MD;  Location: [facility] ENDOSCOPY;  Service: Gastroenterology;;   REMOVAL OF STONES  11/27/2021   Procedure: REMOVAL OF STONES;  Surgeon: Rachael Fee, MD;  Location: [facility] ENDOSCOPY;  Service: Endoscopy;;   REMOVAL OF STONES  02/11/2022   Procedure: REMOVAL OF STONES;  Surgeon: Lemar Lofty., MD;  Location: [facility] ENDOSCOPY;  Service: Gastroenterology;;   SPINAL CORD STIMULATOR IMPLANT     Not active any more.   SPYGLASS CHOLANGIOSCOPY N/A 03/05/2021   Procedure: SPYGLASS CHOLANGIOSCOPY;  Surgeon: Lemar Lofty., MD;  Location: [facility] ENDOSCOPY;  Service: Gastroenterology;  Laterality: N/A;   SPYGLASS CHOLANGIOSCOPY N/A 02/11/2022   Procedure: SPYGLASS CHOLANGIOSCOPY;  Surgeon: Lemar Lofty., MD;  Location: [facility] ENDOSCOPY;  Service: Gastroenterology;  Laterality: N/A;   SPYGLASS LITHOTRIPSY N/A 03/05/2021   Procedure: ZOXWRUEA LITHOTRIPSY;  Surgeon: Lemar Lofty., MD;  Location: [facility] ENDOSCOPY;  Service: Gastroenterology;  Laterality: N/A;   SPYGLASS LITHOTRIPSY N/A 02/11/2022   Procedure: [id number] LITHOTRIPSY;  Surgeon: Lemar Lofty., MD;  Location: [facility] ENDOSCOPY;  Service: Gastroenterology;  Laterality: N/A;   STENT REMOVAL  02/11/2022   Procedure: STENT REMOVAL;  Surgeon: Lemar Lofty., MD;  Location: [facility] ENDOSCOPY;  Service: Gastroenterology;;   TONSILLECTOMY     TOTAL KNEE ARTHROPLASTY Right    Family History Family History  Problem Relation Age of Onset   Diabetes Mother    Heart disease Father    Lung cancer Sister    Lung cancer Son    Hypertension Son    Hypertension Daughter    Diabetes Son    Hypertension Son     Diabetes Daughter    Hypertension Daughter     Social History Social History   Tobacco Use   Smoking status: Former    Current packs/day: 0.00    Average packs/day: 1 pack/day for 35.0 years (35.0 ttl pk-yrs)    Types: Cigarettes    Start date: 01/17/1954    Quit date: 01/17/1989  Years since quitting: 34.6   Smokeless tobacco: Never  Vaping Use   Vaping status: Never Used  Substance Use Topics   Alcohol use: Not Currently   Drug use: Never   Allergies Gramicidin, Sulfa antibiotics, and Wound dressing adhesive  Review of Systems Review of Systems  All other systems reviewed and are negative.   Physical Exam Vital Signs  I have reviewed the triage vital signs BP (!) 142/45   Pulse (!) 52   Temp 98 F (36.7 C) (Oral)   Resp 16   Ht 5\' 4"  (1.626 m)   Wt 69 kg   SpO2 93%   BMI 26.11 kg/m  Physical Exam Vitals and nursing note reviewed.  Constitutional:      General: She is not in acute distress.    Appearance: She is well-developed.  HENT:     Head: Normocephalic and atraumatic.     Mouth/Throat:     Mouth: Mucous membranes are moist.  Eyes:     Pupils: Pupils are equal, round, and reactive to light.  Cardiovascular:     Rate and Rhythm: Normal rate and regular rhythm.     Heart sounds: No murmur heard. Pulmonary:     Effort: Pulmonary effort is normal. No respiratory distress.     Breath sounds: Normal breath sounds.  Abdominal:     General: Abdomen is flat.     Palpations: Abdomen is soft.     Tenderness: There is no abdominal tenderness.  Musculoskeletal:        General: No tenderness.     Right lower leg: No edema.     Left lower leg: No edema.     Comments: No midline C, T, L-spine tenderness.  Full active range of motion of the bilateral upper extremities throughout including the elbows, 2 small abrasions over the elbows but no underlying bony tenderness.  No other tenderness in the upper extremities.  Bilateral lower extremities with chronic right  knee deformity which patient reports is at baseline, otherwise range of motion full throughout including bilateral hips, left knee, bilateral ankles.  Skin:    General: Skin is warm and dry.  Neurological:     General: No focal deficit present.     Mental Status: She is alert. Mental status is at baseline.  Psychiatric:        Mood and Affect: Mood normal.        Behavior: Behavior normal.     ED Results and Treatments Labs (all labs ordered are listed, but only abnormal results are displayed) Labs Reviewed - No data to display                                                                                                                        Radiology DG Hip Unilat With Pelvis 2-3 Views Right  Result Date: 08/22/2023 CLINICAL DATA:  Fall with right-sided hip pain and decreased range of motion EXAM: DG HIP (WITH OR WITHOUT PELVIS) 2-3V RIGHT COMPARISON:  07/23/2021 FINDINGS: Partially visualized right-sided stimulator generator. Left-sided hip replacement partially imaged and appears normal in alignment. Pubic symphysis and rami appear intact. SI joints are non widened. No acute fracture or malalignment. Vascular calcifications. IMPRESSION: No acute osseous abnormality. Electronically Signed   By: Jasmine Pang M.D.   On: 08/22/2023 18:09   CT Head Wo Contrast  Result Date: 08/22/2023 CLINICAL DATA:  Head trauma, minor (Age >= 65y); Neck trauma (Age >= 65y) EXAM: CT HEAD WITHOUT CONTRAST CT CERVICAL SPINE WITHOUT CONTRAST TECHNIQUE: Multidetector CT imaging of the head and cervical spine was performed following the standard protocol without intravenous contrast. Multiplanar CT image reconstructions of the cervical spine were also generated. RADIATION DOSE REDUCTION: This exam was performed according to the departmental dose-optimization program which includes automated exposure control, adjustment of the mA and/or kV according to patient size and/or use of iterative reconstruction technique.  COMPARISON:  07/27/23 head CT FINDINGS: CT HEAD FINDINGS Brain: No evidence of acute infarction, hemorrhage, hydrocephalus, extra-axial collection or mass lesion/mass effect. Generalized volume loss Vascular: No hyperdense vessel or unexpected calcification. Skull: Acute fracture. There is likely an osseous hemangioma in the left frontal bone. Unchanged sclerotic lesion in the left frontal bone (series 302, image 40). Sinuses/Orbits: 2 no middle ear or mastoid effusion. Paranasal sinuses are notable for frothy secretions in the left sphenoid sinus, which can be seen in the setting of acute sinusitis. Bilateral lens replacement. Orbits are otherwise unremarkable. Other: Negative CT CERVICAL SPINE FINDINGS Alignment: Normal. Skull base and vertebrae: No acute fracture. No primary bone lesion or focal pathologic process. Soft tissues and spinal canal: No prevertebral fluid or swelling. No visible canal hematoma. Disc levels:  Moderate spinal canal narrowing at C5-C6. Upper chest: Negative. Other: Extensive atherosclerotic calcifications including of the carotid bifurcations bilaterally as well as in the proximal left subclavian artery. IMPRESSION: 1. No acute intracranial abnormality. 2. No acute fracture or traumatic malalignment of the cervical spine. 3. Frothy secretions in the left sphenoid sinus, which can be seen in the setting of acute sinusitis. 4. Extensive atherosclerotic calcifications including of the carotid bifurcations bilaterally as well as in the proximal left subclavian artery. Consider further evaluation with a carotid ultrasound. Electronically Signed   By: Lorenza Cambridge M.D.   On: 08/22/2023 17:55   CT Cervical Spine Wo Contrast  Result Date: 08/22/2023 CLINICAL DATA:  Head trauma, minor (Age >= 65y); Neck trauma (Age >= 65y) EXAM: CT HEAD WITHOUT CONTRAST CT CERVICAL SPINE WITHOUT CONTRAST TECHNIQUE: Multidetector CT imaging of the head and cervical spine was performed following the standard  protocol without intravenous contrast. Multiplanar CT image reconstructions of the cervical spine were also generated. RADIATION DOSE REDUCTION: This exam was performed according to the departmental dose-optimization program which includes automated exposure control, adjustment of the mA and/or kV according to patient size and/or use of iterative reconstruction technique. COMPARISON:  07/27/23 head CT FINDINGS: CT HEAD FINDINGS Brain: No evidence of acute infarction, hemorrhage, hydrocephalus, extra-axial collection or mass lesion/mass effect. Generalized volume loss Vascular: No hyperdense vessel or unexpected calcification. Skull: Acute fracture. There is likely an osseous hemangioma in the left frontal bone. Unchanged sclerotic lesion in the left frontal bone (series 302, image 40). Sinuses/Orbits: 2 no middle ear or mastoid effusion. Paranasal sinuses are notable for frothy secretions in the left sphenoid sinus, which can be seen in the setting of acute sinusitis. Bilateral lens replacement. Orbits are otherwise unremarkable. Other: Negative CT CERVICAL SPINE FINDINGS Alignment: Normal. Skull base and vertebrae:  No acute fracture. No primary bone lesion or focal pathologic process. Soft tissues and spinal canal: No prevertebral fluid or swelling. No visible canal hematoma. Disc levels:  Moderate spinal canal narrowing at C5-C6. Upper chest: Negative. Other: Extensive atherosclerotic calcifications including of the carotid bifurcations bilaterally as well as in the proximal left subclavian artery. IMPRESSION: 1. No acute intracranial abnormality. 2. No acute fracture or traumatic malalignment of the cervical spine. 3. Frothy secretions in the left sphenoid sinus, which can be seen in the setting of acute sinusitis. 4. Extensive atherosclerotic calcifications including of the carotid bifurcations bilaterally as well as in the proximal left subclavian artery. Consider further evaluation with a carotid ultrasound.  Electronically Signed   By: Lorenza Cambridge M.D.   On: 08/22/2023 17:55    Pertinent labs & imaging results that were available during my care of the patient were reviewed by me and considered in my medical decision making (see MDM for details).  Medications Ordered in ED Medications  acetaminophen (TYLENOL) tablet 1,000 mg (1,000 mg Oral Given 08/22/23 1533)                                                                                                                                     Procedures Procedures  (including critical care time)  Medical Decision Making / ED Course   MDM:  89 year old presenting to the emergency department with fall.  Patient well-appearing, had ground-level fall where she lost balance after attempting to grab a door handle.  Has a few scrapes on her elbows but otherwise nonfocal exam.  No wounds to the head.  Given fall while on blood thinners will obtain CT head, did report some right hip pain so will obtain x-ray of the hip.  Do not think patient needs x-rays of the elbows as she has full painless range of motion of bilateral elbows with no underlying bony tenderness.  Will give Tylenol for pain.    Clinical Course as of 08/22/23 1824  Caleen Essex Aug 22, 2023  1824 CT scans of the head and neck negative.  X-ray of the right hip negative.  Patient able to ambulate with walker. Will discharge patient to home. All questions answered. Patient comfortable with plan of discharge. Return precautions discussed with patient and specified on the after visit summary.  [WS]    Clinical Course User Index [WS] Lonell Grandchild, MD     Additional history obtained: -Additional history obtained from family   Imaging Studies ordered: I ordered imaging studies including CT head and neck, XR hip On my interpretation imaging demonstrates no acute process I independently visualized and interpreted imaging. I agree with the radiologist interpretation   Medicines  ordered and prescription drug management: Meds ordered this encounter  Medications   acetaminophen (TYLENOL) tablet 1,000 mg    -I have reviewed the patients home medicines and have made adjustments as needed  Social Determinants of Health:  Diagnosis or treatment significantly limited by social determinants of health: lives alone   Reevaluation: After the interventions noted above, I reevaluated the patient and found that their symptoms have improved  Co morbidities that complicate the patient evaluation  Past Medical History:  Diagnosis Date   Arthritis    Choledocholithiasis    Diabetes mellitus without complication (HCC)    Diverticulitis    Esophageal cancer (HCC) dx'd 2020   GERD (gastroesophageal reflux disease)    Hyperlipidemia    Hypertension    OSA (obstructive sleep apnea)    Urine incontinence    Uterine cancer (HCC)       Dispostion: Disposition decision including need for hospitalization was considered, and patient discharged from emergency department.    Final Clinical Impression(s) / ED Diagnoses Final diagnoses:  Injury of head, initial encounter  Fall in home, initial encounter     This chart was dictated using voice recognition software.  Despite best efforts to proofread,  errors can occur which can change the documentation meaning.    Lonell Grandchild, MD 08/22/23 [phone]

## 2023-08-22 NOTE — ED Triage Notes (Signed)
The patient fell and hit her head around 2:30. No LOC. She does take a blood thinner.

## 2023-08-22 NOTE — Discharge Instructions (Signed)
We evaluated you for your fall and head injury.  Your testing including CT scan and hip x-ray did not show any dangerous findings.  Please be careful at home to avoid future falls.  You may have a mild concussion from hitting her head.  Please take at 1000 mg of Tylenol every 6 hours as needed for pain.  If you notice specific activities such as reading or watching TV causes your headache to worsen, try to avoid these activities.  Please follow-up with your primary doctor.  If you have any new or worsening symptoms such as severe headaches, dizziness or fainting, difficulty breathing, trouble walking, worsening pain, or any other new symptoms, please return to the emergency department for reassessment.

## 2023-08-29 ENCOUNTER — Telehealth: Payer: Self-pay | Admitting: Family Medicine

## 2023-08-29 NOTE — Telephone Encounter (Signed)
FYI

## 2023-08-29 NOTE — Telephone Encounter (Signed)
Flor from [facility] called to notify Dr. Laury Axon that the pt recently had a fall on Friday 9/06. She was seen in ER and had an X-ray done. According to the pt, her results showed that she did not injure anything but she is still in pain. Flor stated that the pt had an appointment for physical therapy but canceled.

## 2023-09-01 LAB — HM DIABETES EYE EXAM

## 2023-09-10 ENCOUNTER — Ambulatory Visit: Payer: Medicare Other | Admitting: Family Medicine

## 2023-09-10 ENCOUNTER — Telehealth (HOSPITAL_BASED_OUTPATIENT_CLINIC_OR_DEPARTMENT_OTHER): Payer: Self-pay

## 2023-09-10 ENCOUNTER — Encounter: Payer: Self-pay | Admitting: Family Medicine

## 2023-09-10 VITALS — BP 128/43 | HR 58 | Ht 64.0 in | Wt 152.0 lb

## 2023-09-10 DIAGNOSIS — R001 Bradycardia, unspecified: Secondary | ICD-10-CM

## 2023-09-10 DIAGNOSIS — E559 Vitamin D deficiency, unspecified: Secondary | ICD-10-CM | POA: Diagnosis not present

## 2023-09-10 DIAGNOSIS — R5383 Other fatigue: Secondary | ICD-10-CM | POA: Diagnosis not present

## 2023-09-10 DIAGNOSIS — I6523 Occlusion and stenosis of bilateral carotid arteries: Secondary | ICD-10-CM | POA: Diagnosis not present

## 2023-09-10 LAB — COMPREHENSIVE METABOLIC PANEL
ALT: 18 U/L (ref 0–35)
AST: 10 U/L (ref 0–37)
Albumin: 3.7 g/dL (ref 3.5–5.2)
Alkaline Phosphatase: 105 U/L (ref 39–117)
BUN: 29 mg/dL — ABNORMAL HIGH (ref 6–23)
CO2: 26 mEq/L (ref 19–32)
Calcium: 9.3 mg/dL (ref 8.4–10.5)
Chloride: 105 mEq/L (ref 96–112)
Creatinine, Ser: 1.02 mg/dL (ref 0.40–1.20)
GFR: 47.39 mL/min — ABNORMAL LOW (ref >60.00)
Glucose, Bld: 113 mg/dL — ABNORMAL HIGH (ref 70–99)
Potassium: 4.5 mEq/L (ref 3.5–5.1)
Sodium: 138 mEq/L (ref 135–145)
Total Bilirubin: 0.3 mg/dL (ref 0.2–1.2)
Total Protein: 6.2 g/dL (ref 6.0–8.3)

## 2023-09-10 LAB — CBC WITH DIFFERENTIAL/PLATELET
Basophils Absolute: 0 10*3/uL (ref 0.0–0.1)
Basophils Relative: 0.6 % (ref 0.0–3.0)
Eosinophils Absolute: 0.1 10*3/uL (ref 0.0–0.7)
Eosinophils Relative: 1.6 % (ref 0.0–5.0)
HCT: 37.2 % (ref 36.0–46.0)
Hemoglobin: 11.6 g/dL — ABNORMAL LOW (ref 12.0–15.0)
Lymphocytes Relative: 27.8 % (ref 12.0–46.0)
Lymphs Abs: 1.9 10*3/uL (ref 0.7–4.0)
MCHC: 31.2 g/dL (ref 30.0–36.0)
MCV: 87.9 fl (ref 78.0–100.0)
Monocytes Absolute: 0.4 10*3/uL (ref 0.1–1.0)
Monocytes Relative: 5.5 % (ref 3.0–12.0)
Neutro Abs: 4.3 10*3/uL (ref 1.4–7.7)
Neutrophils Relative %: 64.5 % (ref 43.0–77.0)
Platelets: 165 10*3/uL (ref 150.0–400.0)
RBC: 4.24 Mil/uL (ref 3.87–5.11)
RDW: 15.2 % (ref 11.5–15.5)
WBC: 6.7 10*3/uL (ref 4.0–10.5)

## 2023-09-10 LAB — B12 AND FOLATE PANEL
Folate: >24.2 ng/mL (ref >5.9)
Vitamin B-12: 925 pg/mL — ABNORMAL HIGH (ref 211–911)

## 2023-09-10 LAB — TSH: TSH: 3.57 u[IU]/mL (ref 0.35–5.50)

## 2023-09-10 LAB — VITAMIN D 25 HYDROXY (VIT D DEFICIENCY, FRACTURES): VITD: 27.08 ng/mL — ABNORMAL LOW (ref 30.00–100.00)

## 2023-09-10 NOTE — Assessment & Plan Note (Signed)
History of significant PVC burden and bradycardia. Recent fatigue may be related. -Continue current management plan and follow-up with Cardiology as scheduled. Declined repeat EKG today. Stable in office.

## 2023-09-10 NOTE — Progress Notes (Signed)
Acute Office Visit  Subjective:     Patient ID: BLAKLEIGH WAYCHOFF, female    DOB: 10-Jun-1928, 89 y.o.   MRN: [id number]  Chief Complaint  Patient presents with   Fall    HPI Patient is in today for hospital follow-up, fall.  Patient went to [facility] Emergency Department on 08/22/2023 after falling.  She reportedly fell backwards.  She had a normal exam other than a few scrapes on her arms.  They did an x-ray of her right hip which did not show any acute findings.  CT head without contrast with no acute intracranial abnormality and no acute fracture or traumatic malalignment of the cervical spine along with possible signs of acute sinusitis and extensive atherosclerotic calcifications including carotid bifurcations bilaterally as well as in the proximal left subclavian artery.  This consider further evaluation with carotid ultrasound recommended.  Patient was given Tylenol for pain and discharged home.  Of note she did see vascular team in March 2022 after it was discovered that she had 60 to 80 mmHg difference in systolic blood pressure between the left and right arms.  She had carotid ultrasound at that time that showed minimal 1 to 39% stenosis bilaterally with retrograde flow in left vertebral artery.  Specialist suggested that she likely had subclavian stenosis versus occlusion with retrograde flow in the left vertebral artery. Being that this was an asymptomatic incidental finding they recommended observation at that time.     Discussed the use of AI scribe software for clinical note transcription with the patient, who gave verbal consent to proceed.  History of Present Illness   The patient, with a history of bile duct blockages requiring multiple ERCPs, esophageal cancer, and cardiovascular disease, presents for a follow-up after a recent fall. She fell on her right side, with the most significant pain in the hip, and also hit the back of her head. A CT scan and X-rays were  performed, revealing no acute issues. The patient reports residual soreness but denies any other symptoms related to the fall. She experienced transient dizziness post-fall, which has since resolved.  The patient's primary concern is a recent increase in fatigue, which is not her norm. She also reports a brief episode of abdominal pain and yellow bowel movements, which have since returned to normal. The patient has a history of jaundice due to bile duct blockages, but no recent jaundice was observed.  The patient has a history of bradycardia and ventricular bigeminy, which has been monitored by cardiology. She is not currently considered a candidate for a pacemaker. The patient's heart rate has been noted to drop as low as 46 bpm, which could contribute to her fatigue.  The patient's vitamin D and B12 levels were slightly low at her last visit, and she has been taking sublingual supplements for both. The patient's daughter is considering adding a milk thistle supplement to her regimen for liver support.  The patient lives alone and maintains an active lifestyle, including doing her own laundry and cooking. The recent increase in fatigue has led to several days where the patient has not gotten dressed, which is unusual for her. 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 Today is the best she has felt in the past week or so.            ROS All review of systems negative except what  is listed in the HPI      Objective:    BP (!) 128/43   Pulse (!) 58   Ht 5\' 4"  (1.626 m)   Wt 152 lb (68.9 kg)   SpO2 97%   BMI 26.09 kg/m    Physical Exam Vitals reviewed.  Constitutional:      Appearance: Normal appearance.  Cardiovascular:     Rate and Rhythm: Bradycardia present. Rhythm irregular.     Heart sounds: Normal heart sounds.  Pulmonary:     Effort: Pulmonary effort is  normal.     Breath sounds: Normal breath sounds.  Skin:    General: Skin is warm and dry.  Neurological:     Mental Status: She is alert and oriented to person, place, and time.  Psychiatric:        Mood and Affect: Mood normal.        Behavior: Behavior normal.        Thought Content: Thought content normal.        Judgment: Judgment normal.     Results for orders placed or performed in visit on 09/10/23  CBC with Differential/Platelet  Result Value Ref Range   WBC 6.7 4.0 - 10.5 K/uL   RBC 4.24 3.87 - 5.11 Mil/uL   Hemoglobin 11.6 (L) 12.0 - 15.0 g/dL   HCT 86.5 78.4 - 69.6 %   MCV 87.9 78.0 - 100.0 fl   MCHC 31.2 30.0 - 36.0 g/dL   RDW 29.5 28.4 - 13.2 %   Platelets 165.0 150.0 - 400.0 K/uL   Neutrophils Relative % 64.5 43.0 - 77.0 %   Lymphocytes Relative 27.8 12.0 - 46.0 %   Monocytes Relative 5.5 3.0 - 12.0 %   Eosinophils Relative 1.6 0.0 - 5.0 %   Basophils Relative 0.6 0.0 - 3.0 %   Neutro Abs 4.3 1.4 - 7.7 K/uL   Lymphs Abs 1.9 0.7 - 4.0 K/uL   Monocytes Absolute 0.4 0.1 - 1.0 K/uL   Eosinophils Absolute 0.1 0.0 - 0.7 K/uL   Basophils Absolute 0.0 0.0 - 0.1 K/uL  Comprehensive metabolic panel  Result Value Ref Range   Sodium 138 135 - 145 mEq/L   Potassium 4.5 3.5 - 5.1 mEq/L   Chloride 105 96 - 112 mEq/L   CO2 26 19 - 32 mEq/L   Glucose, Bld 113 (H) 70 - 99 mg/dL   BUN 29 (H) 6 - 23 mg/dL   Creatinine, Ser 4.40 0.40 - 1.20 mg/dL   Total Bilirubin 0.3 0.2 - 1.2 mg/dL   Alkaline Phosphatase 105 39 - 117 U/L   AST 10 0 - 37 U/L   ALT 18 0 - 35 U/L   Total Protein 6.2 6.0 - 8.3 g/dL   Albumin 3.7 3.5 - 5.2 g/dL   GFR 10.27 (L) >25.36 mL/min   Calcium 9.3 8.4 - 10.5 mg/dL  TSH  Result Value Ref Range   TSH 3.57 0.35 - 5.50 uIU/mL  B12 and Folate Panel  Result Value Ref Range   Vitamin B-12 925 (H) 211 - 911 pg/mL   Folate >24.2 >5.9 ng/mL  VITAMIN D 25 Hydroxy (Vit-D Deficiency, Fractures)  Result Value Ref Range   VITD 27.08 (L) 30.00 - 100.00 ng/mL         Assessment & Plan:   Problem List Items Addressed This Visit       Active Problems   Bradycardia    History of significant PVC burden and bradycardia. Recent fatigue may  be related. -Continue current management plan and follow-up with Cardiology as scheduled. Declined repeat EKG today. Stable in office.       Other Visit Diagnoses     Fatigue, unspecified type    -  Primary Increased fatigue compared to baseline. Possible causes include recent fall, ongoing cardiac issues, and potential GI dysfunction. -Order comprehensive lab work to assess for any changes. -Continue monitoring symptoms.    Relevant Orders   CBC with Differential/Platelet (Completed)   Comprehensive metabolic panel (Completed)   TSH (Completed)   B12 and Folate Panel (Completed)   VITAMIN D 25 Hydroxy (Vit-D Deficiency, Fractures) (Completed)   Vitamin D deficiency     Previously low Vitamin D levels. Currently on Vitamin D3 5000 IU supplement. -Order lab work to reassess Vitamin D levels.   Relevant Orders   VITAMIN D 25 Hydroxy (Vit-D Deficiency, Fractures) (Completed)   Calcification of both carotid arteries     History of minimal carotid artery and subclavian stenosis. Recent CT scan suggested repeat carotid ultrasound. -Order carotid ultrasound to assess for any progression of disease.   Relevant Orders   US Carotid Bilateral       No orders of the defined types were placed in this encounter.   Return if symptoms worsen or fail to improve.  Clayborne Dana, NP

## 2023-09-22 ENCOUNTER — Ambulatory Visit (HOSPITAL_BASED_OUTPATIENT_CLINIC_OR_DEPARTMENT_OTHER)
Admission: RE | Admit: 2023-09-22 | Discharge: 2023-09-22 | Disposition: A | Discharge status: Home / Self Care | Payer: Medicare Other | Source: Ambulatory Visit | Attending: Family Medicine | Admitting: Family Medicine

## 2023-09-22 DIAGNOSIS — I6523 Occlusion and stenosis of bilateral carotid arteries: Secondary | ICD-10-CM | POA: Diagnosis present

## 2023-09-28 ENCOUNTER — Other Ambulatory Visit: Payer: Self-pay | Admitting: Family Medicine

## 2023-09-28 ENCOUNTER — Encounter: Payer: Self-pay | Admitting: Family Medicine

## 2023-09-28 DIAGNOSIS — K2100 Gastro-esophageal reflux disease with esophagitis, without bleeding: Secondary | ICD-10-CM

## 2023-09-29 MED ORDER — OMEPRAZOLE 20 MG PO CPDR
20.0000 mg | DELAYED_RELEASE_CAPSULE | Freq: Two times a day (BID) | ORAL | 1 refills | Status: DC
Start: 2023-09-29 — End: 2023-12-23

## 2023-09-30 NOTE — Addendum Note (Signed)
Addended by: Hyman Hopes B on: 09/30/2023 12:39 PM   Modules accepted: Orders

## 2023-10-01 ENCOUNTER — Other Ambulatory Visit: Payer: Self-pay | Admitting: Family Medicine

## 2023-10-06 ENCOUNTER — Telehealth: Payer: Self-pay

## 2023-10-06 NOTE — Telephone Encounter (Signed)
Pt's daughter, Michael Litter, called stating that the pt had been seen approx 2 yrs ago and was to f/u PRN. She recently has been experiencing a loss of energy, went to her PCP who ordered a carotid US, and asked to f/u with this office.  Reviewed pt's chart, returned call for clarification, two identifiers used. Reviewed carotid US with daughter, pt has appt with cardiology to f/u on PVC's, and plan is to wait to be seen by [facility] until needed. Confirmed understanding.

## 2023-10-13 ENCOUNTER — Encounter: Payer: Self-pay | Admitting: Cardiovascular Disease

## 2023-10-13 ENCOUNTER — Ambulatory Visit: Payer: Medicare Other | Attending: Cardiovascular Disease | Admitting: Cardiovascular Disease

## 2023-10-13 VITALS — BP 144/60 | HR 34 | Ht 64.0 in | Wt 150.0 lb

## 2023-10-13 DIAGNOSIS — I452 Bifascicular block: Secondary | ICD-10-CM | POA: Diagnosis not present

## 2023-10-13 DIAGNOSIS — D6869 Other thrombophilia: Secondary | ICD-10-CM | POA: Diagnosis present

## 2023-10-13 DIAGNOSIS — I493 Ventricular premature depolarization: Secondary | ICD-10-CM | POA: Diagnosis present

## 2023-10-13 DIAGNOSIS — R001 Bradycardia, unspecified: Secondary | ICD-10-CM | POA: Diagnosis not present

## 2023-10-13 DIAGNOSIS — Z86711 Personal history of pulmonary embolism: Secondary | ICD-10-CM

## 2023-10-13 NOTE — Progress Notes (Signed)
Cardiology Office Note:    Date:  10/13/2023   ID:  Sherry Holloway, DOB [birth year], MRN [id number]  PCP:  Zola Button, Grayling Congress, DO   [facility] Providers Cardiologist:  Thurmon Fair, MD     Referring MD: Zola Button, Grayling Congress, *   Chief Complaint  Patient presents with   Fatigue    History of Present Illness:    Sherry Holloway is a 89 y.o. female with a hx of hyperlipidemia, hypertension, bifascicular block and effective bradycardia due to very frequent PVCs/bigeminy.  She had a period of extreme fatigue and made the appointment a few weeks ago, but this has now resolved.  She actually feels quite well.  She continues to live independently, with assistance from her daughter.  Unfortunately, another one of her daughters recently passed away and she is still grieving that loss.  She has not had syncope.  She denies palpitations or dyspnea at rest or with activity.  She has never complained of chest discomfort.  She has no focal neurological complaints, edema or claudication.  No recent problems with cough or hemoptysis.  When checked by pulse oximetry today her heart rate is 33 bpm, but on the ECG her true heart rate is 69 bpm (sinus rhythm with bigeminal PVCs).  She has unchanged right bundle branch block and left anterior fascicular block.  There is no evidence of high-grade AV block.  Past Medical History:  Diagnosis Date   Arthritis    Choledocholithiasis    Diabetes mellitus without complication (HCC)    Diverticulitis    Esophageal cancer (HCC) dx'd 2020   GERD (gastroesophageal reflux disease)    Hyperlipidemia    Hypertension    OSA (obstructive sleep apnea)    Urine incontinence    Uterine cancer [facility])     Past Surgical History:  Procedure Laterality Date   ABDOMINAL HYSTERECTOMY  1972   APPENDECTOMY     BILIARY DILATION  03/05/2021   Procedure: BILIARY DILATION;  Surgeon: Lemar Lofty., MD;  Location: [facility];  Service:  Gastroenterology;;   BILIARY DILATION  11/27/2021   Procedure: BILIARY DILATION;  Surgeon: Rachael Fee, MD;  Location: [facility] ENDOSCOPY;  Service: Endoscopy;;   BILIARY DILATION  02/11/2022   Procedure: BILIARY DILATION;  Surgeon: Lemar Lofty., MD;  Location: [facility];  Service: Gastroenterology;;   BILIARY STENT PLACEMENT  11/27/2021   Procedure: BILIARY STENT PLACEMENT;  Surgeon: Rachael Fee, MD;  Location: [facility] ENDOSCOPY;  Service: Endoscopy;;   BIOPSY  03/05/2021   Procedure: BIOPSY;  Surgeon: Lemar Lofty., MD;  Location: [facility];  Service: Gastroenterology;;   BIOPSY  11/27/2021   Procedure: BIOPSY;  Surgeon: Rachael Fee, MD;  Location: [facility] ENDOSCOPY;  Service: Endoscopy;;   CHOLECYSTECTOMY     ENDOSCOPIC RETROGRADE CHOLANGIOPANCREATOGRAPHY (ERCP) WITH PROPOFOL N/A 03/05/2021   Procedure: ENDOSCOPIC RETROGRADE CHOLANGIOPANCREATOGRAPHY (ERCP) WITH PROPOFOL;  Surgeon: Lemar Lofty., MD;  Location: [facility] ENDOSCOPY;  Service: Gastroenterology;  Laterality: N/A;   ENDOSCOPIC RETROGRADE CHOLANGIOPANCREATOGRAPHY (ERCP) WITH PROPOFOL N/A 11/27/2021   Procedure: ENDOSCOPIC RETROGRADE CHOLANGIOPANCREATOGRAPHY (ERCP) WITH PROPOFOL;  Surgeon: Rachael Fee, MD;  Location: [facility] ENDOSCOPY;  Service: Endoscopy;  Laterality: N/A;  possible EGD prior   ENDOSCOPIC RETROGRADE CHOLANGIOPANCREATOGRAPHY (ERCP) WITH PROPOFOL N/A 02/11/2022   Procedure: ENDOSCOPIC RETROGRADE CHOLANGIOPANCREATOGRAPHY (ERCP) WITH PROPOFOL;  Surgeon: Meridee Score Netty Starring., MD;  Location: [facility] ENDOSCOPY;  Service: Gastroenterology;  Laterality: N/A;   ESOPHAGOGASTRODUODENOSCOPY N/A 11/27/2021   Procedure: ESOPHAGOGASTRODUODENOSCOPY (EGD);  Surgeon: Rachael Fee, MD;  Location: [facility] ENDOSCOPY;  Service: Endoscopy;  Laterality: N/A;   ESOPHAGOGASTRODUODENOSCOPY (EGD) WITH PROPOFOL N/A 03/05/2021   Procedure: ESOPHAGOGASTRODUODENOSCOPY (EGD) WITH PROPOFOL;  Surgeon: Meridee Score Netty Starring., MD;  Location: [facility] ENDOSCOPY;  Service: Gastroenterology;  Laterality: N/A;   EUS N/A 03/05/2021   Procedure: UPPER ENDOSCOPIC ULTRASOUND (EUS) RADIAL;  Surgeon: Meridee Score Netty Starring., MD;  Location: [facility] ENDOSCOPY;  Service: Gastroenterology;  Laterality: N/A;   LITHOTRIPSY  02/11/2022   Procedure: LITHOTRIPSY;  Surgeon: Mansouraty, Netty Starring., MD;  Location: [facility];  Service: Gastroenterology;;   LUMBAR LAMINECTOMY     REMOVAL OF STONES  03/05/2021   Procedure: REMOVAL OF STONES;  Surgeon: Lemar Lofty., MD;  Location: [facility]COPY;  Service: Gastroenterology;;   REMOVAL OF STONES  11/27/2021   Procedure: REMOVAL OF STONES;  Surgeon: Rachael Fee, MD;  Location: [facility] ENDOSCOPY;  Service: Endoscopy;;   REMOVAL OF STONES  02/11/2022   Procedure: REMOVAL OF STONES;  Surgeon: Lemar Lofty., MD;  Location: [facility]OSCOPY;  Service: Gastroenterology;;   SPINAL CORD STIMULATOR IMPLANT     Not active any more.   SPYGLASS CHOLANGIOSCOPY N/A 03/05/2021   Procedure: SPYGLASS CHOLANGIOSCOPY;  Surgeon: Lemar Lofty., MD;  Location: [facility];  Service: Gastroenterology;  Laterality: N/A;   SPYGLASS CHOLANGIOSCOPY N/A 02/11/2022   Procedure: SPYGLASS CHOLANGIOSCOPY;  Surgeon: Lemar Lofty., MD;  Location: [facility] ENDOSCOPY;  Service: Gastroenterology;  Laterality: N/A;   SPYGLASS LITHOTRIPSY N/A 03/05/2021   Procedure: GEXBMWUX LITHOTRIPSY;  Surgeon: Lemar Lofty., MD;  Location: [facility]OSCOPY;  Service: Gastroenterology;  Laterality: N/A;   SPYGLASS LITHOTRIPSY N/A 02/11/2022   Procedure: LKGMWNUU LITHOTRIPSY;  Surgeon: Lemar Lofty., MD;  Location: [facility]OSCOPY;  Service: Gastroenterology;  Laterality: N/A;   STENT REMOVAL  02/11/2022   Procedure: STENT REMOVAL;  Surgeon: Lemar Lofty., MD;  Location: [facility] ENDOSCOPY;  Service: Gastroenterology;;   TONSILLECTOMY     TOTAL KNEE ARTHROPLASTY Right     Current  Medications: Current Meds  Medication Sig   acetaminophen (TYLENOL) 325 MG tablet Take 352-650 mg by mouth every 6 (six) hours as needed for mild pain or moderate pain.   bumetanide (BUMEX) 1 MG tablet Take 0.5-1 tablets (0.5-1 mg total) by mouth daily as needed (swelling). (Patient taking differently: Take 0.5 mg by mouth daily.)   CREON 36000-114000 units CPEP capsule TAKE 1 CAPSULE THREE TIMES A DAY BEFORE MEALS (Patient taking differently: Take 36,000 Units by mouth 3 (three) times daily before meals.)   docusate sodium (COLACE) 100 MG capsule Take 1 capsule (100 mg total) by mouth 2 (two) times daily. (Patient taking differently: Take 200 mg by mouth daily.)   famciclovir (FAMVIR) 125 MG tablet TAKE 1 TABLET DAILY   glimepiride (AMARYL) 4 MG tablet Take 1 tablet (4 mg total) by mouth 2 (two) times daily.   glucose blood (FREESTYLE LITE) test strip CHECK BLOOD SUGAR TWICE A DAY   JARDIANCE 25 MG TABS tablet TAKE 1 TABLET DAILY BEFORE BREAKFAST   latanoprost (XALATAN) 0.005 % ophthalmic solution Place 1 drop into both eyes at bedtime.   linaclotide (LINZESS) 145 MCG CAPS capsule Take 1 capsule (145 mcg total) by mouth daily before breakfast.   mirtazapine (REMERON) 15 MG tablet TAKE 1 TABLET AT BEDTIME   morphine (MS CONTIN) 30 MG 12 hr tablet Take 30 mg by mouth every 12 (twelve) hours.   Multiple Vitamin (MULTIVITAMIN WITH MINERALS) TABS tablet Take 1 tablet by mouth daily  with supper.   omeprazole (PRILOSEC) 20 MG capsule Take 1 capsule (20 mg total) by mouth 2 (two) times daily before a meal.   senna (SENOKOT) 8.6 MG TABS tablet Take 1 tablet (8.6 mg total) by mouth daily. (Patient taking differently: Take 1 tablet by mouth every evening.)   valsartan (DIOVAN) 40 MG tablet TAKE 1 TABLET DAILY WITH BREAKFAST   XARELTO 20 MG TABS tablet TAKE 1 TABLET DAILY WITH SUPPER, START ONLY AFTER THE STARTER PACK HAS BEEN COMPLETED (Patient taking differently: Take 20 mg by mouth daily with supper.)      Allergies:   Gramicidin, Sulfa antibiotics, and Wound dressing adhesive   Social History   Socioeconomic History   Marital status: Widowed    Spouse name: Not on file   Number of children: 4   Years of education: Not on file   Highest education level: Not on file  Occupational History   Occupation: Retired Lawyer  Tobacco Use   Smoking status: Former    Current packs/day: 0.00    Average packs/day: 1 pack/day for 35.0 years (35.0 ttl pk-yrs)    Types: Cigarettes    Start date: 01/17/1954    Quit date: 01/17/1989    Years since quitting: 34.7   Smokeless tobacco: Never  Vaping Use   Vaping status: Never Used  Substance and Sexual Activity   Alcohol use: Not Currently   Drug use: Never   Sexual activity: Not Currently  Other Topics Concern   Not on file  Social History Narrative   Just moved from [place]--- and moved into an apt 2.4 miles from her daughter    Social Determinants of Health   Financial Resource Strain: Low Risk  (04/02/2022)   Overall Financial Resource Strain (CARDIA)    Difficulty of Paying Living Expenses: Not hard at all  Food Insecurity: No Food Insecurity (07/31/2023)   Hunger Vital Sign    Worried About Running Out of Food in the Last Year: Never true    Ran Out of Food in the Last Year: Never true  Transportation Needs: No Transportation Needs (07/31/2023)   PRAPARE - Administrator, Civil Service (Medical): No    Lack of Transportation (Non-Medical): No  Physical Activity: Sufficiently Active (04/02/2022)   Exercise Vital Sign    Days of Exercise per Week: 5 days    Minutes of Exercise per Session: 60 min  Stress: No Stress Concern Present (04/02/2022)   [employer] of Occupational Health - Occupational Stress Questionnaire    Feeling of Stress : Not at all  Social Connections: Moderately Isolated (04/02/2022)   Social Connection and Isolation Panel [NHANES]    Frequency of Communication with Friends and Family: More than three times a  week    Frequency of Social Gatherings with Friends and Family: Three times a week    Attends Religious Services: More than 4 times per year    Active Member of Clubs or Organizations: No    Attends Banker Meetings: Never    Marital Status: Widowed     Family History: The patient's family history includes Diabetes in her daughter, mother, and son; Heart disease in her father; Hypertension in her daughter, daughter, son, and son; Lung cancer in her sister and son.  ROS:   Please see the history of present illness.     All other systems reviewed and are negative.  EKGs/Labs/Other Studies Reviewed:    The following studies were reviewed today: Echocardiogram 07/28/2023  1. Left ventricular ejection fraction, by estimation, is 65 to 70%. The  left ventricle has normal function. The left ventricle has no regional  wall motion abnormalities. There is moderate asymmetric left ventricular  hypertrophy of the septal segment.  Left ventricular diastolic parameters are indeterminate. Elevated left  ventricular end-diastolic pressure.   2. Right ventricular systolic function is normal. The right ventricular  size is normal. There is normal pulmonary artery systolic pressure. The  estimated right ventricular systolic pressure is 18.5 mmHg.   3. The mitral valve is degenerative. Mild mitral valve regurgitation. No  evidence of mitral stenosis.   4. The aortic valve has an indeterminant number of cusps. There is mild  thickening of the aortic valve. Aortic valve regurgitation is trivial.   5. The inferior vena cava is normal in size with <50% respiratory  variability, suggesting right atrial pressure of 8 mmHg.    EKG Interpretation Date/Time:  Monday October 13 2023 14:28:09 EDT Ventricular Rate:  69 PR Interval:  162 QRS Duration:  136 QT Interval:  434 QTC Calculation: 465 R Axis:   -44  Text Interpretation: Sinus rhythm with frequent Premature ventricular complexes  and Premature atrial complexes Left axis deviation Right bundle branch block Minimal voltage criteria for LVH, may be normal variant ( R in aVL ) Septal infarct , age undetermined When compared with ECG of 27-Jul-2023 11:57, No significant change since last tracing Confirmed by Sandrika Schwinn [phone]) on 10/13/2023 2:32:07 PM    Recent Labs: 05/01/2023: Magnesium 2.1 07/27/2023: B Natriuretic Peptide 334.3 08/04/2023: Pro B Natriuretic peptide (BNP) 251.0 09/10/2023: ALT 18; BUN 29; Creatinine, Ser 1.02; Hemoglobin 11.6; Platelets 165.0; Potassium 4.5; Sodium 138; TSH 3.57  Recent Lipid Panel    Component Value Date/Time   CHOL 134 08/04/2023 1422   TRIG 195.0 (H) 08/04/2023 1422   HDL 51.10 08/04/2023 1422   CHOLHDL 3 08/04/2023 1422   VLDL 39.0 08/04/2023 1422   LDLCALC 44 08/04/2023 1422   LDLCALC 78 10/19/2020 1430   LDLDIRECT 71.0 04/02/2022 1737     Risk Assessment/Calculations:      HYPERTENSION CONTROL Vitals:   10/13/23 1413 10/13/23 1454  BP: (!) 158/42 (!) 144/60    The patient's blood pressure is elevated above target today.  In order to address the patient's elevated BP: Blood pressure will be monitored at home to determine if medication changes need to be made.            Physical Exam:    VS:  BP (!) 144/60   Pulse (!) 34   Ht 5\' 4"  (1.626 m)   Wt 150 lb (68 kg)   SpO2 94%   BMI 25.75 kg/m     Wt Readings from Last 3 Encounters:  10/13/23 150 lb (68 kg)  09/10/23 152 lb (68.9 kg)  08/22/23 152 lb 1.9 oz (69 kg)     GEN:  Well nourished, well developed in no acute distress HEENT: Normal NECK: No JVD; No carotid bruits LYMPHATICS: No lymphadenopathy CARDIAC: Bigeminal rhythm,  no murmurs, rubs, gallops RESPIRATORY:  Clear to auscultation without rales, wheezing or rhonchi  ABDOMEN: Soft, non-tender, non-distended MUSCULOSKELETAL:  No edema; No deformity  SKIN: Warm and dry NEUROLOGIC:  Alert and oriented x 3 PSYCHIATRIC:  Normal affect    ASSESSMENT:    1. PVC's (premature ventricular contractions)   2. Bradycardia   3. Bifascicular block   4. History of pulmonary embolism   5. Acquired thrombophilia (HCC)    PLAN:  In order of problems listed above:  Mrs. Wiesner has artifactual bradycardia (bradysphygmia) due to very frequent PVCs in a pattern of bigeminy.  Despite the fact that this is the rhythm today she no longer complains of fatigue.  She has never had syncope or evidence of high-grade AV block.  She did not tolerate treatment with beta-blockers alone.  We discussed the fact that to correct the effects of the frequent PVCs we would have to both implant a pacemaker and use beta-blockers to suppress the PVCs.    Reviewed the fact that she does have extensive evidence of intraventricular conduction disease and it is possible that a pacemaker will become necessary in the future due to high-grade AV block or complete heart block.  At this point aggressive intervention does not appear necessary.  Will continue to monitor on a yearly basis, but she can call sooner if symptoms worsen.  Effective bradycardia with very long post PVC pauses also explains her very broad pulse pressure.  I do not think we should add any antihypertensive medications.           Medication Adjustments/Labs and Tests Ordered: Current medicines are reviewed at length with the patient today.  Concerns regarding medicines are outlined above.  Orders Placed This Encounter  Procedures   EKG 12-Lead   No orders of the defined types were placed in this encounter.   Patient Instructions  Medication Instructions:  No changes *If you need a refill on your cardiac medications before your next appointment, please call your pharmacy*  Follow-Up: At [facility], you and your health needs are our priority.  As part of our continuing mission to provide you with exceptional heart care, we have created designated Provider Care Teams.   These Care Teams include your primary Cardiologist (physician) and Advanced Practice Providers (APPs -  Physician Assistants and Nurse Practitioners) who all work together to provide you with the care you need, when you need it.  We recommend signing up for the patient portal called "MyChart".  Sign up information is provided on this After Visit Summary.  MyChart is used to connect with patients for Virtual Visits (Telemedicine).  Patients are able to view lab/test results, encounter notes, upcoming appointments, etc.  Non-urgent messages can be sent to your provider as well.   To learn more about what you can do with MyChart, go to [email or web address].    Your next appointment:   1 year(s)  Provider:   Thurmon Fair, MD       Signed, Thurmon Fair, MD  10/13/2023 2:59 PM     [facility]

## 2023-10-13 NOTE — Patient Instructions (Signed)

## 2023-10-23 ENCOUNTER — Other Ambulatory Visit: Payer: Self-pay | Admitting: Family Medicine

## 2023-10-27 ENCOUNTER — Encounter (HOSPITAL_COMMUNITY): Payer: Self-pay

## 2023-10-27 ENCOUNTER — Telehealth: Payer: Self-pay | Admitting: Gastroenterology

## 2023-10-27 ENCOUNTER — Other Ambulatory Visit: Payer: Self-pay

## 2023-10-27 ENCOUNTER — Inpatient Hospital Stay (HOSPITAL_COMMUNITY)
Admission: EM | Admit: 2023-10-27 | Discharge: 2023-10-31 | DRG: 374 | Disposition: A | Discharge status: Home / Self Care | Payer: Medicare Other | Attending: Internal Medicine | Admitting: Internal Medicine

## 2023-10-27 ENCOUNTER — Observation Stay (HOSPITAL_COMMUNITY): Payer: Medicare Other

## 2023-10-27 DIAGNOSIS — Z86711 Personal history of pulmonary embolism: Secondary | ICD-10-CM | POA: Insufficient documentation

## 2023-10-27 DIAGNOSIS — M545 Low back pain, unspecified: Secondary | ICD-10-CM | POA: Diagnosis present

## 2023-10-27 DIAGNOSIS — R1010 Upper abdominal pain, unspecified: Secondary | ICD-10-CM

## 2023-10-27 DIAGNOSIS — R1012 Left upper quadrant pain: Secondary | ICD-10-CM | POA: Diagnosis not present

## 2023-10-27 DIAGNOSIS — Z881 Allergy status to other antibiotic agents status: Secondary | ICD-10-CM

## 2023-10-27 DIAGNOSIS — R109 Unspecified abdominal pain: Secondary | ICD-10-CM | POA: Diagnosis present

## 2023-10-27 DIAGNOSIS — Z85828 Personal history of other malignant neoplasm of skin: Secondary | ICD-10-CM

## 2023-10-27 DIAGNOSIS — K2970 Gastritis, unspecified, without bleeding: Secondary | ICD-10-CM | POA: Diagnosis present

## 2023-10-27 DIAGNOSIS — Z882 Allergy status to sulfonamides status: Secondary | ICD-10-CM

## 2023-10-27 DIAGNOSIS — Z7984 Long term (current) use of oral hypoglycemic drugs: Secondary | ICD-10-CM

## 2023-10-27 DIAGNOSIS — G894 Chronic pain syndrome: Secondary | ICD-10-CM | POA: Diagnosis present

## 2023-10-27 DIAGNOSIS — Z888 Allergy status to other drugs, medicaments and biological substances status: Secondary | ICD-10-CM

## 2023-10-27 DIAGNOSIS — Z833 Family history of diabetes mellitus: Secondary | ICD-10-CM

## 2023-10-27 DIAGNOSIS — I110 Hypertensive heart disease with heart failure: Secondary | ICD-10-CM | POA: Diagnosis present

## 2023-10-27 DIAGNOSIS — K8050 Calculus of bile duct without cholangitis or cholecystitis without obstruction: Secondary | ICD-10-CM | POA: Diagnosis present

## 2023-10-27 DIAGNOSIS — Z66 Do not resuscitate: Secondary | ICD-10-CM | POA: Diagnosis present

## 2023-10-27 DIAGNOSIS — C154 Malignant neoplasm of middle third of esophagus: Secondary | ICD-10-CM

## 2023-10-27 DIAGNOSIS — G8929 Other chronic pain: Secondary | ICD-10-CM | POA: Diagnosis present

## 2023-10-27 DIAGNOSIS — Z96651 Presence of right artificial knee joint: Secondary | ICD-10-CM | POA: Diagnosis present

## 2023-10-27 DIAGNOSIS — E785 Hyperlipidemia, unspecified: Secondary | ICD-10-CM | POA: Diagnosis present

## 2023-10-27 DIAGNOSIS — R748 Abnormal levels of other serum enzymes: Secondary | ICD-10-CM | POA: Diagnosis not present

## 2023-10-27 DIAGNOSIS — R7401 Elevation of levels of liver transaminase levels: Secondary | ICD-10-CM | POA: Diagnosis present

## 2023-10-27 DIAGNOSIS — Z8501 Personal history of malignant neoplasm of esophagus: Secondary | ICD-10-CM

## 2023-10-27 DIAGNOSIS — D490 Neoplasm of unspecified behavior of digestive system: Principal | ICD-10-CM

## 2023-10-27 DIAGNOSIS — E876 Hypokalemia: Secondary | ICD-10-CM | POA: Diagnosis not present

## 2023-10-27 DIAGNOSIS — Z79891 Long term (current) use of opiate analgesic: Secondary | ICD-10-CM

## 2023-10-27 DIAGNOSIS — Z801 Family history of malignant neoplasm of trachea, bronchus and lung: Secondary | ICD-10-CM

## 2023-10-27 DIAGNOSIS — Z79899 Other long term (current) drug therapy: Secondary | ICD-10-CM

## 2023-10-27 DIAGNOSIS — K831 Obstruction of bile duct: Principal | ICD-10-CM

## 2023-10-27 DIAGNOSIS — E1165 Type 2 diabetes mellitus with hyperglycemia: Secondary | ICD-10-CM | POA: Diagnosis present

## 2023-10-27 DIAGNOSIS — M25461 Effusion, right knee: Secondary | ICD-10-CM | POA: Diagnosis present

## 2023-10-27 DIAGNOSIS — G4733 Obstructive sleep apnea (adult) (pediatric): Secondary | ICD-10-CM | POA: Diagnosis present

## 2023-10-27 DIAGNOSIS — K259 Gastric ulcer, unspecified as acute or chronic, without hemorrhage or perforation: Secondary | ICD-10-CM | POA: Diagnosis present

## 2023-10-27 DIAGNOSIS — Z87891 Personal history of nicotine dependence: Secondary | ICD-10-CM

## 2023-10-27 DIAGNOSIS — Z90710 Acquired absence of both cervix and uterus: Secondary | ICD-10-CM

## 2023-10-27 DIAGNOSIS — E871 Hypo-osmolality and hyponatremia: Secondary | ICD-10-CM | POA: Diagnosis not present

## 2023-10-27 DIAGNOSIS — Z7901 Long term (current) use of anticoagulants: Secondary | ICD-10-CM

## 2023-10-27 DIAGNOSIS — Z8249 Family history of ischemic heart disease and other diseases of the circulatory system: Secondary | ICD-10-CM

## 2023-10-27 DIAGNOSIS — I5032 Chronic diastolic (congestive) heart failure: Secondary | ICD-10-CM | POA: Diagnosis present

## 2023-10-27 DIAGNOSIS — M549 Dorsalgia, unspecified: Secondary | ICD-10-CM | POA: Diagnosis not present

## 2023-10-27 DIAGNOSIS — R7989 Other specified abnormal findings of blood chemistry: Principal | ICD-10-CM

## 2023-10-27 DIAGNOSIS — Z9049 Acquired absence of other specified parts of digestive tract: Secondary | ICD-10-CM

## 2023-10-27 DIAGNOSIS — Z8542 Personal history of malignant neoplasm of other parts of uterus: Secondary | ICD-10-CM

## 2023-10-27 DIAGNOSIS — Z9682 Presence of neurostimulator: Secondary | ICD-10-CM

## 2023-10-27 DIAGNOSIS — K219 Gastro-esophageal reflux disease without esophagitis: Secondary | ICD-10-CM | POA: Diagnosis present

## 2023-10-27 LAB — COMPREHENSIVE METABOLIC PANEL
ALT: 163 U/L — ABNORMAL HIGH (ref 0–44)
AST: 312 U/L — ABNORMAL HIGH (ref 15–41)
Albumin: 3.7 g/dL (ref 3.5–5.0)
Alkaline Phosphatase: 265 U/L — ABNORMAL HIGH (ref 38–126)
Anion gap: 8 (ref 5–15)
BUN: 28 mg/dL — ABNORMAL HIGH (ref 8–23)
CO2: 22 mmol/L (ref 22–32)
Calcium: 9.6 mg/dL (ref 8.9–10.3)
Chloride: 107 mmol/L (ref 98–111)
Creatinine, Ser: 0.85 mg/dL (ref 0.44–1.00)
GFR, Estimated: >60 mL/min (ref >60)
Glucose, Bld: 201 mg/dL — ABNORMAL HIGH (ref 70–99)
Potassium: 4.2 mmol/L (ref 3.5–5.1)
Sodium: 137 mmol/L (ref 135–145)
Total Bilirubin: 1.4 mg/dL — ABNORMAL HIGH (ref <1.2)
Total Protein: 7.2 g/dL (ref 6.5–8.1)

## 2023-10-27 LAB — GLUCOSE, CAPILLARY: Glucose-Capillary: 206 mg/dL — ABNORMAL HIGH (ref 70–99)

## 2023-10-27 LAB — CBC WITH DIFFERENTIAL/PLATELET
Abs Immature Granulocytes: 0.04 10*3/uL (ref 0.00–0.07)
Basophils Absolute: 0 10*3/uL (ref 0.0–0.1)
Basophils Relative: 0 %
Eosinophils Absolute: 0 10*3/uL (ref 0.0–0.5)
Eosinophils Relative: 0 %
HCT: 40.4 % (ref 36.0–46.0)
Hemoglobin: 12.6 g/dL (ref 12.0–15.0)
Immature Granulocytes: 0 %
Lymphocytes Relative: 5 %
Lymphs Abs: 0.7 10*3/uL (ref 0.7–4.0)
MCH: 28.1 pg (ref 26.0–34.0)
MCHC: 31.2 g/dL (ref 30.0–36.0)
MCV: 90.2 fL (ref 80.0–100.0)
Monocytes Absolute: 0.4 10*3/uL (ref 0.1–1.0)
Monocytes Relative: 3 %
Neutro Abs: 12.6 10*3/uL — ABNORMAL HIGH (ref 1.7–7.7)
Neutrophils Relative %: 92 %
Platelets: 199 10*3/uL (ref 150–400)
RBC: 4.48 MIL/uL (ref 3.87–5.11)
RDW: 14.5 % (ref 11.5–15.5)
WBC: 13.7 10*3/uL — ABNORMAL HIGH (ref 4.0–10.5)
nRBC: 0 % (ref 0.0–0.2)

## 2023-10-27 LAB — TROPONIN I (HIGH SENSITIVITY): Troponin I (High Sensitivity): 11 ng/L (ref <18)

## 2023-10-27 LAB — APTT: aPTT: 38 s — ABNORMAL HIGH (ref 24–36)

## 2023-10-27 LAB — CK: Total CK: 60 U/L (ref 38–234)

## 2023-10-27 LAB — HEPARIN LEVEL (UNFRACTIONATED): Heparin Unfractionated: 0.72 [IU]/mL — ABNORMAL HIGH (ref 0.30–0.70)

## 2023-10-27 LAB — LIPASE, BLOOD: Lipase: 21 U/L (ref 11–51)

## 2023-10-27 MED ORDER — HYDROMORPHONE HCL 1 MG/ML IJ SOLN: 0.5000 mg | INTRAMUSCULAR | Status: DC | PRN

## 2023-10-27 MED ORDER — ONDANSETRON HCL 4 MG/2ML IJ SOLN
4.0000 mg | Freq: Once | INTRAMUSCULAR | Status: AC
  Administered 2023-10-27: 4 mg via INTRAVENOUS
  Filled 2023-10-27: qty 2

## 2023-10-27 MED ORDER — IRBESARTAN 75 MG PO TABS
75.0000 mg | ORAL_TABLET | Freq: Every day | ORAL | Status: DC
  Administered 2023-10-27 – 2023-10-29 (×3): 75 mg via ORAL
  Filled 2023-10-27 (×4): qty 1

## 2023-10-27 MED ORDER — MIRTAZAPINE 15 MG PO TABS
15.0000 mg | ORAL_TABLET | Freq: Every day | ORAL | Status: DC
  Administered 2023-10-27 – 2023-10-30 (×4): 15 mg via ORAL
  Filled 2023-10-27 (×4): qty 1

## 2023-10-27 MED ORDER — HEPARIN (PORCINE) 25000 UT/250ML-% IV SOLN
950.0000 [IU]/h | INTRAVENOUS | Status: DC
  Administered 2023-10-27: 950 [IU]/h via INTRAVENOUS
  Filled 2023-10-27: qty 250

## 2023-10-27 MED ORDER — INSULIN ASPART 100 UNIT/ML IJ SOLN
0.0000 [IU] | Freq: Every day | INTRAMUSCULAR | Status: DC
  Administered 2023-10-27: 2 [IU] via SUBCUTANEOUS
  Filled 2023-10-27: qty 0.05

## 2023-10-27 MED ORDER — INSULIN ASPART 100 UNIT/ML IJ SOLN
0.0000 [IU] | Freq: Three times a day (TID) | INTRAMUSCULAR | Status: DC
  Filled 2023-10-27: qty 0.09

## 2023-10-27 MED ORDER — SODIUM CHLORIDE 0.9 % IV BOLUS
1000.0000 mL | Freq: Once | INTRAVENOUS | Status: AC
  Administered 2023-10-27: 1000 mL via INTRAVENOUS

## 2023-10-27 MED ORDER — ONDANSETRON HCL 4 MG/2ML IJ SOLN
4.0000 mg | Freq: Four times a day (QID) | INTRAMUSCULAR | Status: DC | PRN
  Administered 2023-10-28: 4 mg via INTRAVENOUS
  Filled 2023-10-27: qty 2

## 2023-10-27 MED ORDER — ONDANSETRON HCL 4 MG PO TABS: 4.0000 mg | ORAL_TABLET | Freq: Four times a day (QID) | ORAL | Status: DC | PRN

## 2023-10-27 MED ORDER — MORPHINE SULFATE (PF) 4 MG/ML IV SOLN
4.0000 mg | Freq: Once | INTRAVENOUS | Status: AC
  Administered 2023-10-27: 4 mg via INTRAVENOUS
  Filled 2023-10-27: qty 1

## 2023-10-27 MED ORDER — MORPHINE SULFATE ER 30 MG PO TBCR
30.0000 mg | EXTENDED_RELEASE_TABLET | Freq: Two times a day (BID) | ORAL | Status: DC
  Administered 2023-10-27 – 2023-10-31 (×8): 30 mg via ORAL
  Filled 2023-10-27 (×8): qty 1

## 2023-10-27 NOTE — ED Triage Notes (Signed)
Pt BIB EMS from Home due to intermittent 10/10 abdominal pain for the past 2 weeks but today the pain is worst and not going away. Pt daughter called EMS. Hx of abdominal pain in the past. Pt states can not use right arm for BP or IV.

## 2023-10-27 NOTE — H&P (Addendum)
History and Physical    Patient: Sherry Holloway ZOX:[id number] DOB: 04-26-1928 DOA: 10/27/2023 DOS: the patient was seen and examined on 10/27/2023 PCP: Donato Schultz, DO  Patient coming from: Home.  Lives alone.  Uses rolling walker at baseline.  Chief Complaint:  Chief Complaint  Patient presents with   Abdominal Pain   HPI: Sherry Holloway is a 89 y.o. female with PMH of esophageal cancer in remission, choledocholithiasis previously treated by ERCP in 01/2022, NIDDM-2, PE on Xarelto, chronic pain on opiate, HTN, and pancreatic insufficiency brought to ED by EMS due to severe abdominal pain.  He is provided by patient and patient's daughter at bedside.  Patient reports 2 brief episodes of abdominal pain about 2 weeks ago that lasted few minutes and resolved.  She was in her usual state of health and ate her breakfast that includes yogurt, toast, coffee and blackberries.  About 12 noon she started experiencing LUQ pain that has gradually gotten worse and became intense.  She called her daughter who is an Char[employer] fundraiser.  They later called GI office and EMS, and patient was brought to ED.  Patient denies radiation of pain.  She is not able to characterize the pain other than calling it severe.  Pain lasted from noon to about 4:30 PM, and resolved after IV morphine and some movements with the guidance of her daughter.  Currently he rates her pain 0/10.  She denies nausea, vomiting, diarrhea, constipation or UTI symptoms.  She reports light brown stool and yellow urine.  Denies melena or hematochezia.  Denies fever, chills, runny nose, sore throat, chest pain, cough or shortness of breath.  Denies focal neurosymptoms.  Reports taking her medications as prescribed.  Last dose of Xarelto was the evening of 11/10.  Patient lives alone.  Uses rolling walker at baseline.  Denies smoking cigarette, drinking alcohol recreational drug use.  In ED, vital signs stable except for mildly elevated BP.  AST 312.   ALT 163.  Total bili 1.4.  Her LFTs were normal in 08/2023.  Lipase 21.  Glucose 201.  Troponin 11. WBC 13.7 with left shift.  EDP discussed patient case with on-call GI, Dr. Barron Alvine who recommended MRCP, and GI evaluation in the morning.  Patient was given IV morphine and IV Zofran x 1.  Hospitalist service called for admission.  Review of Systems: As mentioned in the history of present illness. All other systems reviewed and are negative. Past Medical History:  Diagnosis Date   Arthritis    Choledocholithiasis    Diabetes mellitus without complication (HCC)    Diverticulitis    Esophageal cancer (HCC) dx'd 2020   GERD (gastroesophageal reflux disease)    Hyperlipidemia    Hypertension    OSA (obstructive sleep apnea)    Urine incontinence    Uterine cancer [facility])    Past Surgical History:  Procedure Laterality Date   ABDOMINAL HYSTERECTOMY  1972   APPENDECTOMY     BILIARY DILATION  03/05/2021   Procedure: BILIARY DILATION;  Surgeon: Lemar Lofty., MD;  Location: [facility] ENDOSCOPY;  Service: Gastroenterology;;   BILIARY DILATION  11/27/2021   Procedure: BILIARY DILATION;  Surgeon: Rachael Fee, MD;  Location: [facility] ENDOSCOPY;  Service: Endoscopy;;   BILIARY DILATION  02/11/2022   Procedure: BILIARY DILATION;  Surgeon: Lemar Lofty., MD;  Location: [facility] ENDOSCOPY;  Service: Gastroenterology;;   BILIARY STENT PLACEMENT  11/27/2021   Procedure: BILIARY STENT PLACEMENT;  Surgeon: Rachael Fee, MD;  Location: [facility] ENDOSCOPY;  Service: Endoscopy;;   BIOPSY  03/05/2021   Procedure: BIOPSY;  Surgeon: Lemar Lofty., MD;  Location: [facility] ENDOSCOPY;  Service: Gastroenterology;;   BIOPSY  11/27/2021   Procedure: BIOPSY;  Surgeon: Rachael Fee, MD;  Location: [facility] ENDOSCOPY;  Service: Endoscopy;;   CHOLECYSTECTOMY     ENDOSCOPIC RETROGRADE CHOLANGIOPANCREATOGRAPHY (ERCP) WITH PROPOFOL N/A 03/05/2021   Procedure: ENDOSCOPIC RETROGRADE CHOLANGIOPANCREATOGRAPHY (ERCP)  WITH PROPOFOL;  Surgeon: Lemar Lofty., MD;  Location: [facility] ENDOSCOPY;  Service: Gastroenterology;  Laterality: N/A;   ENDOSCOPIC RETROGRADE CHOLANGIOPANCREATOGRAPHY (ERCP) WITH PROPOFOL N/A 11/27/2021   Procedure: ENDOSCOPIC RETROGRADE CHOLANGIOPANCREATOGRAPHY (ERCP) WITH PROPOFOL;  Surgeon: Rachael Fee, MD;  Location: [facility] ENDOSCOPY;  Service: Endoscopy;  Laterality: N/A;  possible EGD prior   ENDOSCOPIC RETROGRADE CHOLANGIOPANCREATOGRAPHY (ERCP) WITH PROPOFOL N/A 02/11/2022   Procedure: ENDOSCOPIC RETROGRADE CHOLANGIOPANCREATOGRAPHY (ERCP) WITH PROPOFOL;  Surgeon: Meridee Score Netty Starring., MD;  Location: [facility] ENDOSCOPY;  Service: Gastroenterology;  Laterality: N/A;   ESOPHAGOGASTRODUODENOSCOPY N/A 11/27/2021   Procedure: ESOPHAGOGASTRODUODENOSCOPY (EGD);  Surgeon: Rachael Fee, MD;  Location: [facility] ENDOSCOPY;  Service: Endoscopy;  Laterality: N/A;   ESOPHAGOGASTRODUODENOSCOPY (EGD) WITH PROPOFOL N/A 03/05/2021   Procedure: ESOPHAGOGASTRODUODENOSCOPY (EGD) WITH PROPOFOL;  Surgeon: Meridee Score Netty Starring., MD;  Location: [facility] ENDOSCOPY;  Service: Gastroenterology;  Laterality: N/A;   EUS N/A 03/05/2021   Procedure: UPPER ENDOSCOPIC ULTRASOUND (EUS) RADIAL;  Surgeon: Meridee Score Netty Starring., MD;  Location: [facility] ENDOSCOPY;  Service: Gastroenterology;  Laterality: N/A;   LITHOTRIPSY  02/11/2022   Procedure: LITHOTRIPSY;  Surgeon: Mansouraty, Netty Starring., MD;  Location: [facility] ENDOSCOPY;  Service: Gastroenterology;;   LUMBAR LAMINECTOMY     REMOVAL OF STONES  03/05/2021   Procedure: REMOVAL OF STONES;  Surgeon: Lemar Lofty., MD;  Location: [facility] ENDOSCOPY;  Service: Gastroenterology;;   REMOVAL OF STONES  11/27/2021   Procedure: REMOVAL OF STONES;  Surgeon: Rachael Fee, MD;  Location: [facility] ENDOSCOPY;  Service: Endoscopy;;   REMOVAL OF STONES  02/11/2022   Procedure: REMOVAL OF STONES;  Surgeon: Lemar Lofty., MD;  Location: [facility] ENDOSCOPY;  Service: Gastroenterology;;   SPINAL  CORD STIMULATOR IMPLANT     Not active any more.   SPYGLASS CHOLANGIOSCOPY N/A 03/05/2021   Procedure: SPYGLASS CHOLANGIOSCOPY;  Surgeon: Lemar Lofty., MD;  Location: [facility] ENDOSCOPY;  Service: Gastroenterology;  Laterality: N/A;   SPYGLASS CHOLANGIOSCOPY N/A 02/11/2022   Procedure: SPYGLASS CHOLANGIOSCOPY;  Surgeon: Lemar Lofty., MD;  Location: [facility] ENDOSCOPY;  Service: Gastroenterology;  Laterality: N/A;   SPYGLASS LITHOTRIPSY N/A 03/05/2021   Procedure: [id number] LITHOTRIPSY;  Surgeon: Lemar Lofty., MD;  Location: [facility] ENDOSCOPY;  Service: Gastroenterology;  Laterality: N/A;   SPYGLASS LITHOTRIPSY N/A 02/11/2022   Procedure: [id number] LITHOTRIPSY;  Surgeon: Lemar Lofty., MD;  Location: [facility] ENDOSCOPY;  Service: Gastroenterology;  Laterality: N/A;   STENT REMOVAL  02/11/2022   Procedure: STENT REMOVAL;  Surgeon: Meridee Score Netty Starring., MD;  Location: [facility] ENDOSCOPY;  Service: Gastroenterology;;   TONSILLECTOMY     TOTAL KNEE ARTHROPLASTY Right    Social History:  reports that she quit smoking about 34 years ago. Her smoking use included cigarettes. She started smoking about 69 years ago. She has a 35 pack-year smoking history. She has never used smokeless tobacco. She reports that she does not currently use alcohol. She reports that she does not use drugs.  Allergies  Allergen Reactions   Gramicidin Other (See Comments)    Gramicidin is an antibiotic peptide that is used to treat local infections caused by  gram-positive bacteria. Unknown reaction   Sulfa Antibiotics Rash   Wound Dressing Adhesive Rash and Other (See Comments)    Some dressings break out the skin    Family History  Problem Relation Age of Onset   Diabetes Mother    Heart disease Father    Lung cancer Sister    Lung cancer Son    Hypertension Son    Hypertension Daughter    Diabetes Son    Hypertension Son    Diabetes Daughter    Hypertension Daughter     Prior to Admission  medications   Medication Sig Start Date End Date Taking? Authorizing Provider  bumetanide (BUMEX) 1 MG tablet Take 0.5-1 tablets (0.5-1 mg total) by mouth daily as needed (swelling). Patient taking differently: Take 0.5 mg by mouth in the morning. 08/06/22  Yes Lowne [name], Yvonne R, DO  CREON 36000-114000 units CPEP capsule TAKE 1 CAPSULE THREE TIMES A DAY BEFORE MEALS Patient taking differently: Take 36,000 Units by mouth See admin instructions. Take 36,000 units by mouth before breakfast and supper/evening meal 05/05/23  Yes Lowne Florina Ou R, DO  docusate sodium (COLACE) 100 MG capsule Take 1 capsule (100 mg total) by mouth 2 (two) times daily. Patient taking differently: Take 100-200 mg by mouth See admin instructions. Take 200 mg by mouth in the morning and 100 mg in the evening 03/18/21  Yes Osvaldo Shipper, MD  famciclovir [facility]) 125 MG tablet TAKE 1 TABLET DAILY 03/31/23  Yes Seabron Spates R, DO  glimepiride (AMARYL) 4 MG tablet Take 1 tablet (4 mg total) by mouth 2 (two) times daily. 10/01/23  Yes Seabron Spates R, DO  JARDIANCE 25 MG TABS tablet TAKE 1 TABLET DAILY BEFORE BREAKFAST 02/04/23  Yes Seabron Spates R, DO  latanoprost (XALATAN) 0.005 % ophthalmic solution Place 1 drop into both eyes at bedtime. 08/24/21  Yes Donato Schultz, DO  linaclotide [facility]) 145 MCG CAPS capsule Take 1 capsule (145 mcg total) by mouth daily before breakfast. 08/07/23  Yes Zola Button, Yvonne R, DO  mirtazapine (REMERON) 15 MG tablet TAKE 1 TABLET AT BEDTIME 04/16/23  Yes Seabron Spates R, DO  morphine (MS CONTIN) 30 MG 12 hr tablet Take 30 mg by mouth every 12 (twelve) hours. 01/21/22  Yes [provider]  Multiple Vitamin (MULTIVITAMIN WITH MINERALS) TABS tablet Take 1 tablet by mouth daily with supper.   Yes [provider]  omeprazole (PRILOSEC) 20 MG capsule Take 1 capsule (20 mg total) by mouth 2 (two) times daily before a meal. 09/29/23  Yes Lowne [name],  Yvonne R, DO  senna-docusate (SENNA S) 8.6-50 MG tablet Take 1 tablet by mouth See admin instructions. Take 1 tablet by mouth in the morning and evening   Yes [provider]  TYLENOL 500 MG tablet Take 500-1,000 mg by mouth every 6 (six) hours as needed for mild pain (pain score 1-3) or headache.   Yes [provider]  valsartan (DIOVAN) 40 MG tablet TAKE 1 TABLET DAILY WITH BREAKFAST 01/20/23  Yes Lowne [name], Yvonne R, DO  XARELTO 20 MG TABS tablet TAKE 1 TABLET DAILY WITH SUPPER, START ONLY AFTER THE STARTER PACK HAS BEEN COMPLETED Patient taking differently: Take 20 mg by mouth daily with supper. 10/23/23  Yes Seabron Spates R, DO  glucose blood (FREESTYLE LITE) test strip CHECK BLOOD SUGAR TWICE A DAY 11/20/22   Zola Button, Grayling Congress, DO  senna (SENOKOT) 8.6 MG TABS tablet Take  1 tablet (8.6 mg total) by mouth daily. Patient not taking: Reported on 10/27/2023 11/29/21   Alba Cory, MD    Physical Exam: Vitals:   10/27/23 1413 10/27/23 1700 10/27/23 1811 10/27/23 1852  BP: (!) 174/145 (!) 141/116    Pulse: 63 82    Resp: 20 18    Temp: 97.8 F (36.6 C)  97.6 F (36.4 C)   TempSrc: Oral     SpO2: 95% 91%    Weight:   68 kg 68 kg  Height:    5\' 4"  (1.626 m)   GENERAL: No apparent distress.  Nontoxic. HEENT: MMM.  Vision and hearing grossly intact.  NECK: Supple.  No apparent JVD.  RESP:  No IWOB.  Fair aeration bilaterally. CVS:  RRR. Heart sounds normal.  ABD/GI/GU: BS+. Abd soft, NTND.  No Murphy. MSK/EXT:   No apparent deformity. Moves extremities.  Trace BLE edema. SKIN: no apparent skin lesion or wound NEURO: Awake and alert. Oriented appropriately.  No apparent focal neuro deficit. PSYCH: Calm. Normal affect.   Data Reviewed: See HPI  Assessment and Plan: Principal Problem:   Abdominal pain Active Problems:   Uncontrolled type 2 diabetes mellitus with hyperglycemia, without long-term current use of insulin (HCC)   Chronic bilateral back  pain   History of pulmonary embolism   Elevated liver enzymes  LLQ abdominal pain: Presents with acute LUQ abdominal pain.  No associated nausea, vomiting, fever, diarrhea or constipation.  History of choledocholithiasis for which she had ERCP in 2023.  LFT elevated with AST predominance.  Also mildly elevated bilirubin.  Lipase within normal.  Reports regular bowel movement this morning.  She is currently pain-free after IV morphine in ED. abdominal exam is benign.  -[facility] consulted and recommended MRCP and evaluation in the morning.  Unfortunately, patient has spinal stimulator for pain management that need to be checked with pain clinic before proceeding with MRCP -Obtain RUQ ultrasound -Monitor LFT -Continue home morphine with as needed Dilaudid for pain control -Clear liquid tonight -Bridge Xarelto with IV heparin in case of any procedure.  Last dose on Xarelto the evening of 11/10  Uncontrolled DM-2 with hyperglycemia: A1c 7.2% in 07/2023 -CBG monitoring and SSI-sensitive  Essential hypertension: BP slightly elevated likely from pain -Avapro instead of home valsartan -IV hydralazine as needed -Pain control  History of pulmonary embolism: On Xarelto at home.  Reports good compliance -IV heparin  Low back pain: On MS Contin at home -Continue MS Contin -IV Dilaudid as needed for breakthrough pain  Leukocytosis with left shift: Mild.  Has no fever.  Low suspicion for infection at this point.  Leukocytosis likely demargination -Recheck in the morning -Hold off antibiotics for now  Med reconciliation pending at time of admission/this dictation   Advance Care Planning:   Code Status: Limited: Do not attempt resuscitation (DNR) -DNR-LIMITED -Do Not Intubate/DNI .  Discussed with patient and patient's daughter at bedside  Consults: [facility]  Family Communication: Updated patient's daughter at bedside  Severity of Illness: The appropriate patient status for this patient is  OBSERVATION. Observation status is judged to be reasonable and necessary in order to provide the required intensity of service to ensure the patient's safety. The patient's presenting symptoms, physical exam findings, and initial radiographic and laboratory data in the context of their medical condition is felt to place them at decreased risk for further clinical deterioration. Furthermore, it is anticipated that the patient will be medically stable for discharge from the  hospital within 2 midnights of admission.   Author: Almon Hercules, MD 10/27/2023 7:02 PM  For on call review [email or web address].

## 2023-10-27 NOTE — Telephone Encounter (Signed)
Inbound call from patient's daughter, would like a message sent to Dr. Meridee Score, advising him that patient is currently at [facility] being admitted due to her current GI issues. She states she has had to go to the hospital multiple times due to a "clogged Bile duct". Daughter is requesting for one of our providers to go and evaluate patient at the hospital. She states patient is in immense pain. Please advise.

## 2023-10-27 NOTE — Progress Notes (Signed)
PHARMACY - ANTICOAGULATION CONSULT NOTE  Pharmacy Consult for heparin  Indication: hx pulmonary embolus (PTA xarelto on hold)  Allergies  Allergen Reactions   Gramicidin Other (See Comments)    Gramicidin is an antibiotic peptide that is used to treat local infections caused by gram-positive bacteria. Unknown reaction   Sulfa Antibiotics Rash   Wound Dressing Adhesive Rash and Other (See Comments)    Some dressings break out the skin    Patient Measurements: height 64 inches , weight 68 kg   Heparin Dosing Weight: 68 kg  Vital Signs: Temp: 97.6 F (36.4 C) (11/11 1811) Temp Source: Oral (11/11 1413) BP: 141/116 (11/11 1700) Pulse Rate: 82 (11/11 1700)  Labs: Recent Labs    10/27/23 1522  HGB 12.6  HCT 40.4  PLT 199  CREATININE 0.85  TROPONINIHS 11    CrCl cannot be calculated (Unknown ideal weight.).   Medical History: Past Medical History:  Diagnosis Date   Arthritis    Choledocholithiasis    Diabetes mellitus without complication (HCC)    Diverticulitis    Esophageal cancer (HCC) dx'd 2020   GERD (gastroesophageal reflux disease)    Hyperlipidemia    Hypertension    OSA (obstructive sleep apnea)    Urine incontinence    Uterine cancer (HCC)     Medications:  - on Xarelto 20 mg daily PTA (last dose taken on 10/26/23 at 7p)   Assessment: Patient is a 89 y.o F with hx PE on xarelto PTA who presented to the ED on 10/27/23 with c/o abdominal pain. Pharmacy has been consulted to transition anticoag. to heparin drip on admission.  Today, 10/27/2023: - cbc ok - scr <1  Goal of Therapy:  Heparin level 0.3-0.7 units/ml aPTT 66-102 seconds Monitor platelets by anticoagulation protocol: Yes   Plan:  - baseline heparin level and aPTT - heparin drip at 950 units/hr - check 8 hr aptt and heparin level - monitor for s/sx bleeding   Rebel Laughridge P 10/27/2023,6:48 PM

## 2023-10-27 NOTE — ED Provider Notes (Signed)
[facility]ERGENCY DEPARTMENT AT [facility] Provider Note   CSN: [id number] Arrival date & time: 10/27/23  1358     History  Chief Complaint  Patient presents with   Abdominal Pain    Sherry Holloway is a 89 y.o. female.  89 yo F with a chief complaints of recurrent upper abdominal discomfort.  This been an ongoing issue for her.  She has had this off and on for years.  She is known to have recurrent common bile duct stones.  Has had multiple ERCPs.  This pain feels similar has been going off and on for the past couple weeks.  Worsening and much more consistent over the past couple days and has had persistent pain today for over 4 hours.  Last time the family said that she did not get an ERCP until she was acutely jaundiced.   Abdominal Pain      Home Medications Prior to Admission medications   Medication Sig Start Date End Date Taking? Authorizing Provider  bumetanide (BUMEX) 1 MG tablet Take 0.5-1 tablets (0.5-1 mg total) by mouth daily as needed (swelling). Patient taking differently: Take 0.5 mg by mouth in the morning. 08/06/22  Yes Lowne [name], Yvonne R, DO  CREON 36000-114000 units CPEP capsule TAKE 1 CAPSULE THREE TIMES A DAY BEFORE MEALS Patient taking differently: Take 36,000 Units by mouth See admin instructions. Take 36,000 units by mouth before breakfast and supper/evening meal 05/05/23  Yes Lowne Florina Ou R, DO  docusate sodium (COLACE) 100 MG capsule Take 1 capsule (100 mg total) by mouth 2 (two) times daily. Patient taking differently: Take 100-200 mg by mouth See admin instructions. Take 200 mg by mouth in the morning and 100 mg in the evening 03/18/21  Yes Osvaldo Shipper, MD  famciclovir [facility]) 125 MG tablet TAKE 1 TABLET DAILY 03/31/23  Yes Seabron Spates R, DO  glimepiride (AMARYL) 4 MG tablet Take 1 tablet (4 mg total) by mouth 2 (two) times daily. 10/01/23  Yes Seabron Spates R, DO  JARDIANCE 25 MG TABS tablet TAKE 1 TABLET DAILY  BEFORE BREAKFAST 02/04/23  Yes Seabron Spates R, DO  latanoprost (XALATAN) 0.005 % ophthalmic solution Place 1 drop into both eyes at bedtime. 08/24/21  Yes Donato Schultz, DO  linaclotide [facility]) 145 MCG CAPS capsule Take 1 capsule (145 mcg total) by mouth daily before breakfast. 08/07/23  Yes Zola Button, Yvonne R, DO  mirtazapine (REMERON) 15 MG tablet TAKE 1 TABLET AT BEDTIME 04/16/23  Yes Seabron Spates R, DO  morphine (MS CONTIN) 30 MG 12 hr tablet Take 30 mg by mouth every 12 (twelve) hours. 01/21/22  Yes [provider]  Multiple Vitamin (MULTIVITAMIN WITH MINERALS) TABS tablet Take 1 tablet by mouth daily with supper.   Yes [provider]  omeprazole (PRILOSEC) 20 MG capsule Take 1 capsule (20 mg total) by mouth 2 (two) times daily before a meal. 09/29/23  Yes Lowne [name], Yvonne R, DO  senna-docusate (SENNA S) 8.6-50 MG tablet Take 1 tablet by mouth See admin instructions. Take 1 tablet by mouth in the morning and evening   Yes [provider]  TYLENOL 500 MG tablet Take 500-1,000 mg by mouth every 6 (six) hours as needed for mild pain (pain score 1-3) or headache.   Yes [provider]  valsartan (DIOVAN) 40 MG tablet TAKE 1 TABLET DAILY WITH BREAKFAST 01/20/23  Yes Lowne [name], Yvonne R, DO  XARELTO 20 MG  TABS tablet TAKE 1 TABLET DAILY WITH SUPPER, START ONLY AFTER THE STARTER PACK HAS BEEN COMPLETED Patient taking differently: Take 20 mg by mouth daily with supper. 10/23/23  Yes Seabron Spates R, DO  glucose blood (FREESTYLE LITE) test strip CHECK BLOOD SUGAR TWICE A DAY 11/20/22   Zola Button, Grayling Congress, DO  senna (SENOKOT) 8.6 MG TABS tablet Take 1 tablet (8.6 mg total) by mouth daily. Patient not taking: Reported on 10/27/2023 11/29/21   Hartley Barefoot A, MD      Allergies    Gramicidin, Sulfa antibiotics, and Wound dressing adhesive    Review of Systems   Review of Systems  Gastrointestinal:  Positive for abdominal pain.     Physical Exam Updated Vital Signs BP (!) 179/51 (BP Location: Right Arm)   Pulse 75   Temp 98.6 F (37 C) (Oral)   Resp 17   Ht 5\' 4"  (1.626 m)   Wt 68 kg   SpO2 99%   BMI 25.75 kg/m  Physical Exam Vitals and nursing note reviewed.  Constitutional:      General: She is not in acute distress.    Appearance: She is well-developed. She is not diaphoretic.  HENT:     Head: Normocephalic and atraumatic.  Eyes:     Pupils: Pupils are equal, round, and reactive to light.  Cardiovascular:     Rate and Rhythm: Normal rate and regular rhythm.     Heart sounds: No murmur heard.    No friction rub. No gallop.  Pulmonary:     Effort: Pulmonary effort is normal.     Breath sounds: No wheezing or rales.  Abdominal:     General: There is no distension.     Palpations: Abdomen is soft.     Tenderness: There is abdominal tenderness.     Comments: Pain worse in the epigastrium and right upper quadrant.  Negative Murphy sign.  Musculoskeletal:        General: No tenderness.     Cervical back: Normal range of motion and neck supple.  Skin:    General: Skin is warm and dry.  Neurological:     Mental Status: She is alert and oriented to person, place, and time.  Psychiatric:        Behavior: Behavior normal.     ED Results / Procedures / Treatments   Labs (all labs ordered are listed, but only abnormal results are displayed) Labs Reviewed  CBC WITH DIFFERENTIAL/PLATELET - Abnormal; Notable for the following components:      Result Value   WBC 13.7 (*)    Neutro Abs 12.6 (*)    All other components within normal limits  COMPREHENSIVE METABOLIC PANEL - Abnormal; Notable for the following components:   Glucose, Bld 201 (*)    BUN 28 (*)    AST 312 (*)    ALT 163 (*)    Alkaline Phosphatase 265 (*)    Total Bilirubin 1.4 (*)    All other components within normal limits  LIPASE, BLOOD  HEPARIN LEVEL (UNFRACTIONATED)  APTT  APTT  HEPARIN LEVEL (UNFRACTIONATED)  CBC  CK   COMPREHENSIVE METABOLIC PANEL  CBC  MAGNESIUM  CK  TROPONIN I (HIGH SENSITIVITY)    EKG None  Radiology No results found.  Procedures Procedures    Medications Ordered in ED Medications  morphine (MS CONTIN) 12 hr tablet 30 mg (has no administration in time range)  irbesartan (AVAPRO) tablet 75 mg (75 mg Oral Given 10/27/23  1935)  mirtazapine (REMERON) tablet 15 mg (has no administration in time range)  HYDROmorphone (DILAUDID) injection 0.5 mg (has no administration in time range)  ondansetron (ZOFRAN) tablet 4 mg (has no administration in time range)    Or  ondansetron (ZOFRAN) injection 4 mg (has no administration in time range)  insulin aspart (novoLOG) injection 0-9 Units (has no administration in time range)  insulin aspart (novoLOG) injection 0-5 Units (has no administration in time range)  heparin ADULT infusion 100 units/mL (25000 units/246mL) (has no administration in time range)  sodium chloride 0.9 % bolus 1,000 mL (1,000 mLs Intravenous New Bag/Given 10/27/23 1545)  morphine (PF) 4 MG/ML injection 4 mg (4 mg Intravenous Given 10/27/23 1540)  ondansetron (ZOFRAN) injection 4 mg (4 mg Intravenous Given 10/27/23 1538)    ED Course/ Medical Decision Making/ A&P                                 Medical Decision Making Amount and/or Complexity of Data Reviewed Labs: ordered. Radiology: ordered. ECG/medicine tests: ordered.  Risk Prescription drug management. Decision regarding hospitalization.   89 yo F with a chief complaints of epigastric abdominal discomfort.  This has been going on for a couple weeks.  Her daughter is here and provides further history tells me that she has had episodic common bile duct stones.  Has required removal about once a year or so.  Will treat pain and nausea.  LFTs and lipase.  Will discuss with GI.  LFTs and lipase are elevated from baseline.  Leukocytosis of 13,000 with a neutrophilic predominance.  I discussed case with  Dr. Barron Alvine, gastroenterology.  Recommends MRCP and medical admission.  The patients results and plan were reviewed and discussed.   Any x-rays performed were independently reviewed by myself.   Differential diagnosis were considered with the presenting HPI.  Medications  morphine (MS CONTIN) 12 hr tablet 30 mg (has no administration in time range)  irbesartan (AVAPRO) tablet 75 mg (75 mg Oral Given 10/27/23 1935)  mirtazapine (REMERON) tablet 15 mg (has no administration in time range)  HYDROmorphone (DILAUDID) injection 0.5 mg (has no administration in time range)  ondansetron (ZOFRAN) tablet 4 mg (has no administration in time range)    Or  ondansetron (ZOFRAN) injection 4 mg (has no administration in time range)  insulin aspart (novoLOG) injection 0-9 Units (has no administration in time range)  insulin aspart (novoLOG) injection 0-5 Units (has no administration in time range)  heparin ADULT infusion 100 units/mL (25000 units/216mL) (has no administration in time range)  sodium chloride 0.9 % bolus 1,000 mL (1,000 mLs Intravenous New Bag/Given 10/27/23 1545)  morphine (PF) 4 MG/ML injection 4 mg (4 mg Intravenous Given 10/27/23 1540)  ondansetron (ZOFRAN) injection 4 mg (4 mg Intravenous Given 10/27/23 1538)    Vitals:   10/27/23 1700 10/27/23 1811 10/27/23 1852 10/27/23 1924  BP: (!) 141/116   (!) 179/51  Pulse: 82   75  Resp: 18   17  Temp:  97.6 F (36.4 C)  98.6 F (37 C)  TempSrc:    Oral  SpO2: 91%   99%  Weight:  68 kg 68 kg   Height:   5\' 4"  (1.626 m)     Final diagnoses:  LFT elevation  Upper abdominal pain    Admission/ observation were discussed with the admitting physician, patient and/or family and they are comfortable with the plan.  Final Clinical Impression(s) / ED Diagnoses Final diagnoses:  LFT elevation  Upper abdominal pain    Rx / DC Orders ED Discharge Orders     None         Melene Plan, DO 10/27/23 1943

## 2023-10-27 NOTE — Telephone Encounter (Signed)
I spoke with the pt daughter and she tells me that she has spoken to the ED doc and he explained the consult process to her.  She has not questions at this time.

## 2023-10-27 NOTE — ED Notes (Signed)
ED TO INPATIENT HANDOFF REPORT  ED Nurse Name and Phone #: Suann Larry Name/Age/Gender Sherry Holloway 89 y.o. female Room/Bed: WA05/WA05  Code Status   Code Status: Prior  Home/SNF/Other Home Patient oriented to: self, place, time, and situation Is this baseline? Yes   Triage Complete: Triage complete  Chief Complaint Abdominal pain [R10.9]  Triage Note Pt BIB EMS from Home due to intermittent 10/10 abdominal pain for the past 2 weeks but today the pain is worst and not going away. Pt daughter called EMS. Hx of abdominal pain in the past. Pt states can not use right arm for BP or IV.   Allergies Allergies  Allergen Reactions   Gramicidin Other (See Comments)    Unknown reaction type   Sulfa Antibiotics Rash   Wound Dressing Adhesive Rash    Level of Care/Admitting Diagnosis ED Disposition     ED Disposition  Admit   Condition  --   Comment  Hospital Area: [facility] [[id number]]  Level of Care: Med-Surg [16]  May place patient in observation at [facility] or [facility] if equivalent level of care is available:: No  Covid Evaluation: Asymptomatic - no recent exposure (last 10 days) testing not required  Diagnosis: Abdominal pain [161096]  Admitting Physician: Almon Hercules [[id number]]  Attending Physician: Almon Hercules [[id number]]          B Medical/Surgery History Past Medical History:  Diagnosis Date   Arthritis    Choledocholithiasis    Diabetes mellitus without complication (HCC)    Diverticulitis    Esophageal cancer (HCC) dx'd 2020   GERD (gastroesophageal reflux disease)    Hyperlipidemia    Hypertension    OSA (obstructive sleep apnea)    Urine incontinence    Uterine cancer (HCC)    Past Surgical History:  Procedure Laterality Date   ABDOMINAL HYSTERECTOMY  1972   APPENDECTOMY     BILIARY DILATION  03/05/2021   Procedure: BILIARY DILATION;  Surgeon: Lemar Lofty., MD;  Location: [facility] ENDOSCOPY;  Service:  Gastroenterology;;   BILIARY DILATION  11/27/2021   Procedure: BILIARY DILATION;  Surgeon: Rachael Fee, MD;  Location: [facility] ENDOSCOPY;  Service: Endoscopy;;   BILIARY DILATION  02/11/2022   Procedure: BILIARY DILATION;  Surgeon: Lemar Lofty., MD;  Location: [facility] ENDOSCOPY;  Service: Gastroenterology;;   BILIARY STENT PLACEMENT  11/27/2021   Procedure: BILIARY STENT PLACEMENT;  Surgeon: Rachael Fee, MD;  Location: [facility] ENDOSCOPY;  Service: Endoscopy;;   BIOPSY  03/05/2021   Procedure: BIOPSY;  Surgeon: Lemar Lofty., MD;  Location: [facility] ENDOSCOPY;  Service: Gastroenterology;;   BIOPSY  11/27/2021   Procedure: BIOPSY;  Surgeon: Rachael Fee, MD;  Location: [facility] ENDOSCOPY;  Service: Endoscopy;;   CHOLECYSTECTOMY     ENDOSCOPIC RETROGRADE CHOLANGIOPANCREATOGRAPHY (ERCP) WITH PROPOFOL N/A 03/05/2021   Procedure: ENDOSCOPIC RETROGRADE CHOLANGIOPANCREATOGRAPHY (ERCP) WITH PROPOFOL;  Surgeon: Lemar Lofty., MD;  Location: [facility] ENDOSCOPY;  Service: Gastroenterology;  Laterality: N/A;   ENDOSCOPIC RETROGRADE CHOLANGIOPANCREATOGRAPHY (ERCP) WITH PROPOFOL N/A 11/27/2021   Procedure: ENDOSCOPIC RETROGRADE CHOLANGIOPANCREATOGRAPHY (ERCP) WITH PROPOFOL;  Surgeon: Rachael Fee, MD;  Location: [facility] ENDOSCOPY;  Service: Endoscopy;  Laterality: N/A;  possible EGD prior   ENDOSCOPIC RETROGRADE CHOLANGIOPANCREATOGRAPHY (ERCP) WITH PROPOFOL N/A 02/11/2022   Procedure: ENDOSCOPIC RETROGRADE CHOLANGIOPANCREATOGRAPHY (ERCP) WITH PROPOFOL;  Surgeon: Meridee Score Netty Starring., MD;  Location: [facility] ENDOSCOPY;  Service: Gastroenterology;  Laterality: N/A;   ESOPHAGOGASTRODUODENOSCOPY N/A 11/27/2021   Procedure: ESOPHAGOGASTRODUODENOSCOPY (EGD);  Surgeon: Christella Hartigan,  Melton Alar, MD;  Location: [facility] ENDOSCOPY;  Service: Endoscopy;  Laterality: N/A;   ESOPHAGOGASTRODUODENOSCOPY (EGD) WITH PROPOFOL N/A 03/05/2021   Procedure: ESOPHAGOGASTRODUODENOSCOPY (EGD) WITH PROPOFOL;  Surgeon: Meridee Score Netty Starring., MD;  Location: [facility] ENDOSCOPY;  Service: Gastroenterology;  Laterality: N/A;   EUS N/A 03/05/2021   Procedure: UPPER ENDOSCOPIC ULTRASOUND (EUS) RADIAL;  Surgeon: Meridee Score Netty Starring., MD;  Location: [facility] ENDOSCOPY;  Service: Gastroenterology;  Laterality: N/A;   LITHOTRIPSY  02/11/2022   Procedure: LITHOTRIPSY;  Surgeon: Mansouraty, Netty Starring., MD;  Location: [facility] ENDOSCOPY;  Service: Gastroenterology;;   LUMBAR LAMINECTOMY     REMOVAL OF STONES  03/05/2021   Procedure: REMOVAL OF STONES;  Surgeon: Lemar Lofty., MD;  Location: [facility] ENDOSCOPY;  Service: Gastroenterology;;   REMOVAL OF STONES  11/27/2021   Procedure: REMOVAL OF STONES;  Surgeon: Rachael Fee, MD;  Location: [facility] ENDOSCOPY;  Service: Endoscopy;;   REMOVAL OF STONES  02/11/2022   Procedure: REMOVAL OF STONES;  Surgeon: Lemar Lofty., MD;  Location: [facility] ENDOSCOPY;  Service: Gastroenterology;;   SPINAL CORD STIMULATOR IMPLANT     Not active any more.   SPYGLASS CHOLANGIOSCOPY N/A 03/05/2021   Procedure: SPYGLASS CHOLANGIOSCOPY;  Surgeon: Lemar Lofty., MD;  Location: [facility] ENDOSCOPY;  Service: Gastroenterology;  Laterality: N/A;   SPYGLASS CHOLANGIOSCOPY N/A 02/11/2022   Procedure: SPYGLASS CHOLANGIOSCOPY;  Surgeon: Lemar Lofty., MD;  Location: [facility] ENDOSCOPY;  Service: Gastroenterology;  Laterality: N/A;   SPYGLASS LITHOTRIPSY N/A 03/05/2021   Procedure: [id number] LITHOTRIPSY;  Surgeon: Lemar Lofty., MD;  Location: [facility] ENDOSCOPY;  Service: Gastroenterology;  Laterality: N/A;   SPYGLASS LITHOTRIPSY N/A 02/11/2022   Procedure: [id number] LITHOTRIPSY;  Surgeon: Lemar Lofty., MD;  Location: [facility] ENDOSCOPY;  Service: Gastroenterology;  Laterality: N/A;   STENT REMOVAL  02/11/2022   Procedure: STENT REMOVAL;  Surgeon: Lemar Lofty., MD;  Location: [facility] ENDOSCOPY;  Service: Gastroenterology;;   TONSILLECTOMY     TOTAL KNEE ARTHROPLASTY Right      A IV  Location/Drains/Wounds Patient Lines/Drains/Airways Status     Active Line/Drains/Airways     Name Placement date Placement time Site Days   Peripheral IV 10/27/23 20 G Left Antecubital 10/27/23  1529  Antecubital  less than 1            Intake/Output Last 24 hours No intake or output data in the 24 hours ending 10/27/23 1814  Labs/Imaging Results for orders placed or performed during the hospital encounter of 10/27/23 (from the past 48 hour(s))  CBC with Differential     Status: Abnormal   Collection Time: 10/27/23  3:22 PM  Result Value Ref Range   WBC 13.7 (H) 4.0 - 10.5 K/uL   RBC 4.48 3.87 - 5.11 MIL/uL   Hemoglobin 12.6 12.0 - 15.0 g/dL   HCT 59.5 63.8 - 75.6 %   MCV 90.2 80.0 - 100.0 fL   MCH 28.1 26.0 - 34.0 pg   MCHC 31.2 30.0 - 36.0 g/dL   RDW 43.3 29.5 - 18.8 %   Platelets 199 150 - 400 K/uL   nRBC 0.0 0.0 - 0.2 %   Neutrophils Relative % 92 %   Neutro Abs 12.6 (H) 1.7 - 7.7 K/uL   Lymphocytes Relative 5 %   Lymphs Abs 0.7 0.7 - 4.0 K/uL   Monocytes Relative 3 %   Monocytes Absolute 0.4 0.1 - 1.0 K/uL   Eosinophils Relative 0 %   Eosinophils Absolute 0.0 0.0 - 0.5 K/uL   Basophils Relative  0 %   Basophils Absolute 0.0 0.0 - 0.1 K/uL   Immature Granulocytes 0 %   Abs Immature Granulocytes 0.04 0.00 - 0.07 K/uL    Comment: Performed at [facility], [address], [place], [place] [address]  Comprehensive metabolic panel     Status: Abnormal   Collection Time: 10/27/23  3:22 PM  Result Value Ref Range   Sodium 137 135 - 145 mmol/L   Potassium 4.2 3.5 - 5.1 mmol/L   Chloride 107 98 - 111 mmol/L   CO2 22 22 - 32 mmol/L   Glucose, Bld 201 (H) 70 - 99 mg/dL    Comment: Glucose reference range applies only to samples taken after fasting for at least 8 hours.   BUN 28 (H) 8 - 23 mg/dL   Creatinine, Ser 6.04 0.44 - 1.00 mg/dL   Calcium 9.6 8.9 - 54.0 mg/dL   Total Protein 7.2 6.5 - 8.1 g/dL   Albumin 3.7 3.5 - 5.0 g/dL   AST 981 (H) 15  - 41 U/L   ALT 163 (H) 0 - 44 U/L   Alkaline Phosphatase 265 (H) 38 - 126 U/L   Total Bilirubin 1.4 (H) <1.2 mg/dL   GFR, Estimated >19 >14 mL/min    Comment: (NOTE) Calculated using the CKD-EPI Creatinine Equation (2021)    Anion gap 8 5 - 15    Comment: Performed at [facility], [address] [place], [place] [address]  Lipase, blood     Status: None   Collection Time: 10/27/23  3:22 PM  Result Value Ref Range   Lipase 21 11 - 51 U/L    Comment: Performed at [facility], [address] [place], [place] [address]  Troponin I (High Sensitivity)     Status: None   Collection Time: 10/27/23  3:22 PM  Result Value Ref Range   Troponin I (High Sensitivity) 11 <18 ng/L    Comment: (NOTE) Elevated high sensitivity troponin I (hsTnI) values and significant  changes across serial measurements may suggest ACS but many other  chronic and acute conditions are known to elevate hsTnI results.  Refer to the "Links" section for chest pain algorithms and additional  guidance. Performed at [facility], [address] [place], [place] [address]    No results found.  Pending Labs Unresulted Labs (From admission, onward)    None       Vitals/Pain Today's Vitals   10/27/23 1417 10/27/23 1700 10/27/23 1756 10/27/23 1811  BP:  (!) 141/116    Pulse:  82    Resp:  18    Temp:    97.6 F (36.4 C)  TempSrc:      SpO2:  91%    PainSc: 10-Worst pain ever  1      Isolation Precautions No active isolations  Medications Medications  sodium chloride 0.9 % bolus 1,000 mL (1,000 mLs Intravenous New Bag/Given 10/27/23 1545)  morphine (PF) 4 MG/ML injection 4 mg (4 mg Intravenous Given 10/27/23 1540)  ondansetron (ZOFRAN) injection 4 mg (4 mg Intravenous Given 10/27/23 1538)    Mobility non-ambulatory     Focused Assessments Abdominal Pain   R Recommendations: See Admitting Provider Note  Report given to:    Additional Notes: .

## 2023-10-27 NOTE — Progress Notes (Signed)
Pt has EKG order, results notified to on call Nelia Shi.

## 2023-10-28 ENCOUNTER — Encounter (HOSPITAL_COMMUNITY): Payer: Self-pay | Admitting: Student

## 2023-10-28 ENCOUNTER — Encounter: Payer: Self-pay | Admitting: Family Medicine

## 2023-10-28 ENCOUNTER — Observation Stay (HOSPITAL_COMMUNITY): Payer: Medicare Other

## 2023-10-28 DIAGNOSIS — M545 Low back pain, unspecified: Secondary | ICD-10-CM | POA: Diagnosis present

## 2023-10-28 DIAGNOSIS — K8050 Calculus of bile duct without cholangitis or cholecystitis without obstruction: Secondary | ICD-10-CM | POA: Diagnosis not present

## 2023-10-28 DIAGNOSIS — R7989 Other specified abnormal findings of blood chemistry: Secondary | ICD-10-CM | POA: Diagnosis not present

## 2023-10-28 DIAGNOSIS — M5440 Lumbago with sciatica, unspecified side: Secondary | ICD-10-CM | POA: Diagnosis not present

## 2023-10-28 DIAGNOSIS — R1010 Upper abdominal pain, unspecified: Secondary | ICD-10-CM

## 2023-10-28 DIAGNOSIS — D490 Neoplasm of unspecified behavior of digestive system: Secondary | ICD-10-CM | POA: Diagnosis present

## 2023-10-28 DIAGNOSIS — I110 Hypertensive heart disease with heart failure: Secondary | ICD-10-CM | POA: Diagnosis present

## 2023-10-28 DIAGNOSIS — I5032 Chronic diastolic (congestive) heart failure: Secondary | ICD-10-CM | POA: Diagnosis present

## 2023-10-28 DIAGNOSIS — R1013 Epigastric pain: Secondary | ICD-10-CM

## 2023-10-28 DIAGNOSIS — K862 Cyst of pancreas: Secondary | ICD-10-CM | POA: Diagnosis not present

## 2023-10-28 DIAGNOSIS — Z7901 Long term (current) use of anticoagulants: Secondary | ICD-10-CM | POA: Diagnosis not present

## 2023-10-28 DIAGNOSIS — E785 Hyperlipidemia, unspecified: Secondary | ICD-10-CM | POA: Diagnosis present

## 2023-10-28 DIAGNOSIS — K219 Gastro-esophageal reflux disease without esophagitis: Secondary | ICD-10-CM | POA: Diagnosis present

## 2023-10-28 DIAGNOSIS — K838 Other specified diseases of biliary tract: Secondary | ICD-10-CM | POA: Diagnosis not present

## 2023-10-28 DIAGNOSIS — Z7984 Long term (current) use of oral hypoglycemic drugs: Secondary | ICD-10-CM | POA: Diagnosis not present

## 2023-10-28 DIAGNOSIS — M25461 Effusion, right knee: Secondary | ICD-10-CM | POA: Diagnosis present

## 2023-10-28 DIAGNOSIS — Z79899 Other long term (current) drug therapy: Secondary | ICD-10-CM | POA: Diagnosis not present

## 2023-10-28 DIAGNOSIS — K259 Gastric ulcer, unspecified as acute or chronic, without hemorrhage or perforation: Secondary | ICD-10-CM

## 2023-10-28 DIAGNOSIS — Z8249 Family history of ischemic heart disease and other diseases of the circulatory system: Secondary | ICD-10-CM | POA: Diagnosis not present

## 2023-10-28 DIAGNOSIS — Z85828 Personal history of other malignant neoplasm of skin: Secondary | ICD-10-CM | POA: Diagnosis not present

## 2023-10-28 DIAGNOSIS — G894 Chronic pain syndrome: Secondary | ICD-10-CM | POA: Diagnosis present

## 2023-10-28 DIAGNOSIS — E1165 Type 2 diabetes mellitus with hyperglycemia: Secondary | ICD-10-CM | POA: Diagnosis present

## 2023-10-28 DIAGNOSIS — Z79891 Long term (current) use of opiate analgesic: Secondary | ICD-10-CM | POA: Diagnosis not present

## 2023-10-28 DIAGNOSIS — Z66 Do not resuscitate: Secondary | ICD-10-CM | POA: Diagnosis present

## 2023-10-28 DIAGNOSIS — K8051 Calculus of bile duct without cholangitis or cholecystitis with obstruction: Secondary | ICD-10-CM | POA: Diagnosis not present

## 2023-10-28 DIAGNOSIS — K2970 Gastritis, unspecified, without bleeding: Secondary | ICD-10-CM | POA: Diagnosis present

## 2023-10-28 DIAGNOSIS — G4733 Obstructive sleep apnea (adult) (pediatric): Secondary | ICD-10-CM | POA: Diagnosis present

## 2023-10-28 DIAGNOSIS — K831 Obstruction of bile duct: Secondary | ICD-10-CM | POA: Diagnosis present

## 2023-10-28 DIAGNOSIS — R7401 Elevation of levels of liver transaminase levels: Secondary | ICD-10-CM | POA: Diagnosis present

## 2023-10-28 DIAGNOSIS — E871 Hypo-osmolality and hyponatremia: Secondary | ICD-10-CM | POA: Diagnosis not present

## 2023-10-28 DIAGNOSIS — Z87891 Personal history of nicotine dependence: Secondary | ICD-10-CM | POA: Diagnosis not present

## 2023-10-28 DIAGNOSIS — E876 Hypokalemia: Secondary | ICD-10-CM | POA: Diagnosis not present

## 2023-10-28 LAB — CBC
HCT: 34.4 % — ABNORMAL LOW (ref 36.0–46.0)
Hemoglobin: 11.2 g/dL — ABNORMAL LOW (ref 12.0–15.0)
MCH: 28.6 pg (ref 26.0–34.0)
MCHC: 32.6 g/dL (ref 30.0–36.0)
MCV: 88 fL (ref 80.0–100.0)
Platelets: 169 10*3/uL (ref 150–400)
RBC: 3.91 MIL/uL (ref 3.87–5.11)
RDW: 14.6 % (ref 11.5–15.5)
WBC: 9.8 10*3/uL (ref 4.0–10.5)
nRBC: 0 % (ref 0.0–0.2)

## 2023-10-28 LAB — COMPREHENSIVE METABOLIC PANEL
ALT: 650 U/L — ABNORMAL HIGH (ref 0–44)
AST: 683 U/L — ABNORMAL HIGH (ref 15–41)
Albumin: 2.9 g/dL — ABNORMAL LOW (ref 3.5–5.0)
Alkaline Phosphatase: 310 U/L — ABNORMAL HIGH (ref 38–126)
Anion gap: 7 (ref 5–15)
BUN: 29 mg/dL — ABNORMAL HIGH (ref 8–23)
CO2: 21 mmol/L — ABNORMAL LOW (ref 22–32)
Calcium: 8.8 mg/dL — ABNORMAL LOW (ref 8.9–10.3)
Chloride: 108 mmol/L (ref 98–111)
Creatinine, Ser: 0.9 mg/dL (ref 0.44–1.00)
GFR, Estimated: 60 mL/min — ABNORMAL LOW (ref >60)
Glucose, Bld: 100 mg/dL — ABNORMAL HIGH (ref 70–99)
Potassium: 3.9 mmol/L (ref 3.5–5.1)
Sodium: 136 mmol/L (ref 135–145)
Total Bilirubin: 2.2 mg/dL — ABNORMAL HIGH (ref <1.2)
Total Protein: 5.7 g/dL — ABNORMAL LOW (ref 6.5–8.1)

## 2023-10-28 LAB — HEPARIN LEVEL (UNFRACTIONATED)
Heparin Unfractionated: 0.32 [IU]/mL (ref 0.30–0.70)
Heparin Unfractionated: 0.24 [IU]/mL — ABNORMAL LOW (ref 0.30–0.70)
Heparin Unfractionated: 0.2 [IU]/mL — ABNORMAL LOW (ref 0.30–0.70)

## 2023-10-28 LAB — APTT
aPTT: 84 s — ABNORMAL HIGH (ref 24–36)
aPTT: 67 s — ABNORMAL HIGH (ref 24–36)

## 2023-10-28 LAB — MAGNESIUM: Magnesium: 2.1 mg/dL (ref 1.7–2.4)

## 2023-10-28 LAB — GLUCOSE, CAPILLARY
Glucose-Capillary: 70 mg/dL (ref 70–99)
Glucose-Capillary: 61 mg/dL — ABNORMAL LOW (ref 70–99)
Glucose-Capillary: 72 mg/dL (ref 70–99)
Glucose-Capillary: 151 mg/dL — ABNORMAL HIGH (ref 70–99)

## 2023-10-28 LAB — CK: Total CK: 50 U/L (ref 38–234)

## 2023-10-28 MED ORDER — HEPARIN (PORCINE) 25000 UT/250ML-% IV SOLN
1200.0000 [IU]/h | INTRAVENOUS | Status: AC
  Administered 2023-10-28 (×2): 1050 [IU]/h via INTRAVENOUS
  Filled 2023-10-28: qty 250

## 2023-10-28 MED ORDER — LACTATED RINGERS IV SOLN: INTRAVENOUS | Status: DC

## 2023-10-28 MED ORDER — IOHEXOL 350 MG/ML SOLN
100.0000 mL | Freq: Once | INTRAVENOUS | Status: AC | PRN
  Administered 2023-10-28: 80 mL via INTRAVENOUS

## 2023-10-28 MED ORDER — INSULIN ASPART 100 UNIT/ML IJ SOLN
0.0000 [IU] | Freq: Three times a day (TID) | INTRAMUSCULAR | Status: DC
  Administered 2023-10-29: 2 [IU] via SUBCUTANEOUS
  Administered 2023-10-30: 1 [IU] via SUBCUTANEOUS
  Administered 2023-10-30: 2 [IU] via SUBCUTANEOUS
  Administered 2023-10-31: 1 [IU] via SUBCUTANEOUS

## 2023-10-28 MED ORDER — HEPARIN (PORCINE) 25000 UT/250ML-% IV SOLN
1050.0000 [IU]/h | INTRAVENOUS | Status: DC
  Administered 2023-10-28: 1050 [IU]/h via INTRAVENOUS

## 2023-10-28 MED ORDER — SODIUM CHLORIDE 0.9 % IV SOLN: INTRAVENOUS | Status: DC

## 2023-10-28 MED ORDER — DEXTROSE-SODIUM CHLORIDE 5-0.45 % IV SOLN
INTRAVENOUS | Status: AC
Start: 2023-10-28 — End: 2023-10-29

## 2023-10-28 MED ORDER — DEXTROSE 50 % IV SOLN
INTRAVENOUS | Status: AC
  Administered 2023-10-28: 25 mL
  Filled 2023-10-28: qty 50

## 2023-10-28 MED ORDER — HYDRALAZINE HCL 20 MG/ML IJ SOLN: 10.0000 mg | Freq: Four times a day (QID) | INTRAMUSCULAR | Status: DC | PRN

## 2023-10-28 MED ORDER — PIPERACILLIN-TAZOBACTAM 3.375 G IVPB
3.3750 g | Freq: Three times a day (TID) | INTRAVENOUS | Status: DC
Start: 2023-10-28 — End: 2023-10-29
  Administered 2023-10-28 – 2023-10-29 (×4): 3.375 g via INTRAVENOUS
  Filled 2023-10-28 (×4): qty 50

## 2023-10-28 NOTE — Consult Note (Signed)
Consultation  Referring Provider: TRH/ Rai Primary Care Physician:  Zola Button, Grayling Congress, DO Primary Gastroenterologist:  Dr.Mansouraty  Reason for Consultation: Acute abdominal pain, elevated LFTs, history of choledocholithiasis  HPI: Sherry Holloway is a 89 y.o. female, known to Dr. Irish Lack Roddy from previous ERCP, last done in February 2023. Patient is status post remote cholecystectomy.  She was hospitalized in December 2022 with acute abdominal pain.  This is in the setting of previous ERCP and sphincterotomy done in 2020 while she was living in [place], then EUS/ERCP March 2022 which showed patent biliary sphincterotomy, severely dilated main bile duct, choledocholithiasis was found but complete removal was not accomplished with sweeping initially, she had sphincteroplasty done and then remainder of stones removed.  Also had spyglass with no other abnormalities noted. She had March 2022 EUS which showed a small submucosal ulcerating nodule with no bleeding or stigmata of bleeding in the midesophagus. Pancreatic duct was noted to be dilated in the pancreatic head genu of the pancreas and the body of the pancreas, dilated CBD at 10.3 mm.  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  Patient says that she has had a few recent episodes of epigastric pain that have been mild and short-lived.  However Monday she had an episode that was fairly severe and more prolonged lasting at least 4 hours.  She had not felt well since then and has had some nausea.  She was brought to the ER last evening and admitted.  She was unaware of any fever or chills at home.  She has not had any persistent ongoing pain but did have an episode of vomiting this morning He says these episodes are reminiscent of previous episodes with choledocholithiasis.  Ultrasound on admission showed her to be status  postcholecystectomy with CBD of 18 mm with filling defects up to 14 mm in the bile duct.   Labs today-WBC 9.8/hemoglobin 11.2/hematocrit 34.4 BUN 29/creatinine 0.91 T. bili up to 2.2 today/alk phos 310/AST 683/ALT 650  CT of the abdomen and pelvis has been ordered, done and report pending.   Past Medical History:  Diagnosis Date   Arthritis    Choledocholithiasis    Diabetes mellitus without complication (HCC)    Diverticulitis    Esophageal cancer (HCC) dx'd 2020   GERD (gastroesophageal reflux disease)    Hyperlipidemia    Hypertension    OSA (obstructive sleep apnea)    Urine incontinence    Uterine cancer [facility])     Past Surgical History:  Procedure Laterality Date   ABDOMINAL HYSTERECTOMY  1972   APPENDECTOMY     BILIARY DILATION  03/05/2021   Procedure: BILIARY DILATION;  Surgeon: Lemar Lofty., MD;  Location: [facility] ENDOSCOPY;  Service: Gastroenterology;;   BILIARY DILATION  11/27/2021   Procedure: BILIARY DILATION;  Surgeon: Rachael Fee, MD;  Location: [facility] ENDOSCOPY;  Service: Endoscopy;;   BILIARY DILATION  02/11/2022   Procedure: BILIARY DILATION;  Surgeon: Lemar Lofty., MD;  Location: [facility] ENDOSCOPY;  Service: Gastroenterology;;   BILIARY STENT PLACEMENT  11/27/2021   Procedure: BILIARY STENT PLACEMENT;  Surgeon: Rachael Fee, MD;  Location: [facility] ENDOSCOPY;  Service: Endoscopy;;   BIOPSY  03/05/2021   Procedure: BIOPSY;  Surgeon: Lemar Lofty., MD;  Location: [facility] ENDOSCOPY;  Service: Gastroenterology;;   BIOPSY  11/27/2021   Procedure: BIOPSY;  Surgeon: Rachael Fee, MD;  Location: [facility]  ENDOSCOPY;  Service: Endoscopy;;   CHOLECYSTECTOMY     ENDOSCOPIC RETROGRADE CHOLANGIOPANCREATOGRAPHY (ERCP) WITH PROPOFOL N/A 03/05/2021   Procedure: ENDOSCOPIC RETROGRADE CHOLANGIOPANCREATOGRAPHY (ERCP) WITH PROPOFOL;  Surgeon: Meridee Score Netty Starring., MD;  Location: [facility] ENDOSCOPY;  Service: Gastroenterology;  Laterality: N/A;   ENDOSCOPIC  RETROGRADE CHOLANGIOPANCREATOGRAPHY (ERCP) WITH PROPOFOL N/A 11/27/2021   Procedure: ENDOSCOPIC RETROGRADE CHOLANGIOPANCREATOGRAPHY (ERCP) WITH PROPOFOL;  Surgeon: Rachael Fee, MD;  Location: [facility] ENDOSCOPY;  Service: Endoscopy;  Laterality: N/A;  possible EGD prior   ENDOSCOPIC RETROGRADE CHOLANGIOPANCREATOGRAPHY (ERCP) WITH PROPOFOL N/A 02/11/2022   Procedure: ENDOSCOPIC RETROGRADE CHOLANGIOPANCREATOGRAPHY (ERCP) WITH PROPOFOL;  Surgeon: Meridee Score Netty Starring., MD;  Location: [facility] ENDOSCOPY;  Service: Gastroenterology;  Laterality: N/A;   ESOPHAGOGASTRODUODENOSCOPY N/A 11/27/2021   Procedure: ESOPHAGOGASTRODUODENOSCOPY (EGD);  Surgeon: Rachael Fee, MD;  Location: [facility] ENDOSCOPY;  Service: Endoscopy;  Laterality: N/A;   ESOPHAGOGASTRODUODENOSCOPY (EGD) WITH PROPOFOL N/A 03/05/2021   Procedure: ESOPHAGOGASTRODUODENOSCOPY (EGD) WITH PROPOFOL;  Surgeon: Meridee Score Netty Starring., MD;  Location: [facility] ENDOSCOPY;  Service: Gastroenterology;  Laterality: N/A;   EUS N/A 03/05/2021   Procedure: UPPER ENDOSCOPIC ULTRASOUND (EUS) RADIAL;  Surgeon: Meridee Score Netty Starring., MD;  Location: [facility] ENDOSCOPY;  Service: Gastroenterology;  Laterality: N/A;   LITHOTRIPSY  02/11/2022   Procedure: LITHOTRIPSY;  Surgeon: Mansouraty, Netty Starring., MD;  Location: [facility] ENDOSCOPY;  Service: Gastroenterology;;   LUMBAR LAMINECTOMY     REMOVAL OF STONES  03/05/2021   Procedure: REMOVAL OF STONES;  Surgeon: Lemar Lofty., MD;  Location: [facility] ENDOSCOPY;  Service: Gastroenterology;;   REMOVAL OF STONES  11/27/2021   Procedure: REMOVAL OF STONES;  Surgeon: Rachael Fee, MD;  Location: [facility] ENDOSCOPY;  Service: Endoscopy;;   REMOVAL OF STONES  02/11/2022   Procedure: REMOVAL OF STONES;  Surgeon: Lemar Lofty., MD;  Location: [facility] ENDOSCOPY;  Service: Gastroenterology;;   SPINAL CORD STIMULATOR IMPLANT     Not active any more.   SPYGLASS CHOLANGIOSCOPY N/A 03/05/2021   Procedure: SPYGLASS CHOLANGIOSCOPY;  Surgeon:  Lemar Lofty., MD;  Location: [facility] ENDOSCOPY;  Service: Gastroenterology;  Laterality: N/A;   SPYGLASS CHOLANGIOSCOPY N/A 02/11/2022   Procedure: SPYGLASS CHOLANGIOSCOPY;  Surgeon: Lemar Lofty., MD;  Location: [facility] ENDOSCOPY;  Service: Gastroenterology;  Laterality: N/A;   SPYGLASS LITHOTRIPSY N/A 03/05/2021   Procedure: [id number] LITHOTRIPSY;  Surgeon: Lemar Lofty., MD;  Location: [facility] ENDOSCOPY;  Service: Gastroenterology;  Laterality: N/A;   SPYGLASS LITHOTRIPSY N/A 02/11/2022   Procedure: [id number] LITHOTRIPSY;  Surgeon: Lemar Lofty., MD;  Location: [facility] ENDOSCOPY;  Service: Gastroenterology;  Laterality: N/A;   STENT REMOVAL  02/11/2022   Procedure: STENT REMOVAL;  Surgeon: Lemar Lofty., MD;  Location: [facility] ENDOSCOPY;  Service: Gastroenterology;;   TONSILLECTOMY     TOTAL KNEE ARTHROPLASTY Right     Prior to Admission medications   Medication Sig Start Date End Date Taking? Authorizing Provider  bumetanide (BUMEX) 1 MG tablet Take 0.5-1 tablets (0.5-1 mg total) by mouth daily as needed (swelling). Patient taking differently: Take 0.5 mg by mouth in the morning. 08/06/22  Yes Lowne [name], Yvonne R, DO  CREON 36000-114000 units CPEP capsule TAKE 1 CAPSULE THREE TIMES A DAY BEFORE MEALS Patient taking differently: Take 36,000 Units by mouth See admin instructions. Take 36,000 units by mouth before breakfast and supper/evening meal 05/05/23  Yes Lowne Florina Ou R, DO  docusate sodium (COLACE) 100 MG capsule Take 1 capsule (100 mg total) by mouth 2 (two) times daily. Patient taking differently: Take 100-200 mg by mouth See admin instructions. Take  200 mg by mouth in the morning and 100 mg in the evening 03/18/21  Yes Osvaldo Shipper, MD  famciclovir [facility]) 125 MG tablet TAKE 1 TABLET DAILY 03/31/23  Yes Seabron Spates R, DO  glimepiride (AMARYL) 4 MG tablet Take 1 tablet (4 mg total) by mouth 2 (two) times daily. 10/01/23  Yes Seabron Spates  R, DO  JARDIANCE 25 MG TABS tablet TAKE 1 TABLET DAILY BEFORE BREAKFAST 02/04/23  Yes Seabron Spates R, DO  latanoprost (XALATAN) 0.005 % ophthalmic solution Place 1 drop into both eyes at bedtime. 08/24/21  Yes Donato Schultz, DO  linaclotide [facility]) 145 MCG CAPS capsule Take 1 capsule (145 mcg total) by mouth daily before breakfast. 08/07/23  Yes Zola Button, Yvonne R, DO  mirtazapine (REMERON) 15 MG tablet TAKE 1 TABLET AT BEDTIME 04/16/23  Yes Seabron Spates R, DO  morphine (MS CONTIN) 30 MG 12 hr tablet Take 30 mg by mouth every 12 (twelve) hours. 01/21/22  Yes [provider]  Multiple Vitamin (MULTIVITAMIN WITH MINERALS) TABS tablet Take 1 tablet by mouth daily with supper.   Yes [provider]  omeprazole (PRILOSEC) 20 MG capsule Take 1 capsule (20 mg total) by mouth 2 (two) times daily before a meal. 09/29/23  Yes Lowne [name], Yvonne R, DO  senna-docusate (SENNA S) 8.6-50 MG tablet Take 1 tablet by mouth See admin instructions. Take 1 tablet by mouth in the morning and evening   Yes [provider]  TYLENOL 500 MG tablet Take 500-1,000 mg by mouth every 6 (six) hours as needed for mild pain (pain score 1-3) or headache.   Yes [provider]  valsartan (DIOVAN) 40 MG tablet TAKE 1 TABLET DAILY WITH BREAKFAST 01/20/23  Yes Lowne [name], Yvonne R, DO  XARELTO 20 MG TABS tablet TAKE 1 TABLET DAILY WITH SUPPER, START ONLY AFTER THE STARTER PACK HAS BEEN COMPLETED Patient taking differently: Take 20 mg by mouth daily with supper. 10/23/23  Yes Seabron Spates R, DO  glucose blood (FREESTYLE LITE) test strip CHECK BLOOD SUGAR TWICE A DAY 11/20/22   Zola Button, Grayling Congress, DO  senna (SENOKOT) 8.6 MG TABS tablet Take 1 tablet (8.6 mg total) by mouth daily. Patient not taking: Reported on 10/27/2023 11/29/21   Alba Cory, MD    Current Facility-Administered Medications  Medication Dose Route Frequency Provider Last Rate Last Admin   0.9 %   sodium chloride infusion   Intravenous Continuous Mansouraty, Netty Starring., MD       heparin ADULT infusion 100 units/mL (25000 units/268mL)  950 Units/hr Intravenous Continuous Lucia Gaskins, RPH 9.5 mL/hr at 10/27/23 2027 950 Units/hr at 10/27/23 2027   hydrALAZINE (APRESOLINE) injection 10 mg  10 mg Intravenous Q6H PRN Rai, Ripudeep K, MD       HYDROmorphone (DILAUDID) injection 0.5 mg  0.5 mg Intravenous Q3H PRN Gonfa, Taye T, MD       insulin aspart (novoLOG) injection 0-5 Units  0-5 Units Subcutaneous QHS Candelaria Stagers T, MD   2 Units at 10/27/23 2209   insulin aspart (novoLOG) injection 0-9 Units  0-9 Units Subcutaneous TID WC Gonfa, Taye T, MD       irbesartan (AVAPRO) tablet 75 mg  75 mg Oral Daily Candelaria Stagers T, MD   75 mg at 10/28/23 1136   lactated ringers infusion   Intravenous Continuous Rai, Ripudeep K, MD 75 mL/hr at 10/28/23 0801 New Bag at 10/28/23 0801   mirtazapine (  REMERON) tablet 15 mg  15 mg Oral QHS Candelaria Stagers T, MD   15 mg at 10/27/23 2207   morphine (MS CONTIN) 12 hr tablet 30 mg  30 mg Oral Q12H Gonfa, Taye T, MD   30 mg at 10/28/23 1136   ondansetron (ZOFRAN) tablet 4 mg  4 mg Oral Q6H PRN Almon Hercules, MD       Or   ondansetron (ZOFRAN) injection 4 mg  4 mg Intravenous Q6H PRN Candelaria Stagers T, MD   4 mg at 10/28/23 0857   piperacillin-tazobactam (ZOSYN) IVPB 3.375 g  3.375 g Intravenous Q8H Shade, Christine E, RPH 12.5 mL/hr at 10/28/23 1141 3.375 g at 10/28/23 1141    Allergies as of 10/27/2023 - Review Complete 10/27/2023  Allergen Reaction Noted   Gramicidin Other (See Comments) 01/17/2021   Sulfa antibiotics Rash 10/19/2020   Wound dressing adhesive Rash and Other (See Comments) 10/19/2020    Family History  Problem Relation Age of Onset   Diabetes Mother    Heart disease Father    Lung cancer Sister    Lung cancer Son    Hypertension Son    Hypertension Daughter    Diabetes Son    Hypertension Son    Diabetes Daughter    Hypertension Daughter      Social History   Socioeconomic History   Marital status: Widowed    Spouse name: Not on file   Number of children: 4   Years of education: Not on file   Highest education level: Not on file  Occupational History   Occupation: Retired Lawyer  Tobacco Use   Smoking status: Former    Current packs/day: 0.00    Average packs/day: 1 pack/day for 35.0 years (35.0 ttl pk-yrs)    Types: Cigarettes    Start date: 01/17/1954    Quit date: 01/17/1989    Years since quitting: 34.8   Smokeless tobacco: Never  Vaping Use   Vaping status: Never Used  Substance and Sexual Activity   Alcohol use: Not Currently   Drug use: Never   Sexual activity: Not Currently  Other Topics Concern   Not on file  Social History Narrative   Just moved from [place]--- and moved into an apt 2.4 miles from her daughter    Social Determinants of Health   Financial Resource Strain: Low Risk  (04/02/2022)   Overall Financial Resource Strain (CARDIA)    Difficulty of Paying Living Expenses: Not hard at all  Food Insecurity: No Food Insecurity (10/27/2023)   Hunger Vital Sign    Worried About Running Out of Food in the Last Year: Never true    Ran Out of Food in the Last Year: Never true  Transportation Needs: No Transportation Needs (10/27/2023)   PRAPARE - Administrator, Civil Service (Medical): No    Lack of Transportation (Non-Medical): No  Physical Activity: Sufficiently Active (04/02/2022)   Exercise Vital Sign    Days of Exercise per Week: 5 days    Minutes of Exercise per Session: 60 min  Stress: No Stress Concern Present (04/02/2022)   [employer] of Occupational Health - Occupational Stress Questionnaire    Feeling of Stress : Not at all  Social Connections: Moderately Isolated (04/02/2022)   Social Connection and Isolation Panel [NHANES]    Frequency of Communication with Friends and Family: More than three times a week    Frequency of Social Gatherings with Friends and Family: Three  times a week  Attends Religious Services: More than 4 times per year    Active Member of Clubs or Organizations: No    Attends Banker Meetings: Never    Marital Status: Widowed  Intimate Partner Violence: Not At Risk (10/27/2023)   Humiliation, Afraid, Rape, and Kick questionnaire    Fear of Current or Ex-Partner: No    Emotionally Abused: No    Physically Abused: No    Sexually Abused: No    Review of Systems: Pertinent positive and negative review of systems were noted in the above HPI section.  All other review of systems was otherwise negative.   Physical Exam: Vital signs in last 24 hours: Temp:  [97.6 F (36.4 C)-98.6 F (37 C)] 98.2 F (36.8 C) (11/12 1053) Pulse Rate:  [57-82] 57 (11/12 1053) Resp:  [17-20] 18 (11/12 1053) BP: (122-179)/(39-145) 138/39 (11/12 1053) SpO2:  [91 %-99 %] 97 % (11/12 1053) Weight:  [68 kg] 68 kg (11/11 1852) Last BM Date : 10/27/23 General:   Alert,  Well-developed, well-nourished, very elderly female pleasant and cooperative in NAD, sitting up in the chair Head:  Normocephalic and atraumatic. Eyes:  Sclera clear, no icterus.   Conjunctiva pink. Ears:  Normal auditory acuity. Nose:  No deformity, discharge,  or lesions. Mouth:  No deformity or lesions.   Neck:  Supple; no masses or thyromegaly. Lungs:  Clear throughout to auscultation.   No wheezes, crackles, or rhonchi.  Heart:  Regular rate and rhythm; no murmurs, clicks, rubs,  or gallops. Abdomen:  Soft, very mildly tender in the epigastrium, no guarding or rebound BS active,nonpalp mass or hsm.   Rectal: Not done Msk:  Symmetrical without gross deformities. . Pulses:  Normal pulses noted. Extremities:  Without clubbing or edema. Neurologic:  Alert and  oriented x4;  grossly normal neurologically. Skin:  Intact without significant lesions or rashes.. Psych:  Alert and cooperative. Normal mood and affect.  Intake/Output from previous day: 11/11 0701 - 11/12  0700 In: 86.7 [P.O.:15; I.V.:71.7] Out: 0  Intake/Output this shift: No intake/output data recorded.  Lab Results: Recent Labs    10/27/23 1522 10/28/23 0444  WBC 13.7* 9.8  HGB 12.6 11.2*  HCT 40.4 34.4*  PLT 199 169   BMET Recent Labs    10/27/23 1522 10/28/23 0444  NA 137 136  K 4.2 3.9  CL 107 108  CO2 22 21*  GLUCOSE 201* 100*  BUN 28* 29*  CREATININE 0.85 0.90  CALCIUM 9.6 8.8*   LFT Recent Labs    10/28/23 0444  PROT 5.7*  ALBUMIN 2.9*  AST 683*  ALT 650*  ALKPHOS 310*  BILITOT 2.2*   PT/INR No results for input(s): "LABPROT", "INR" in the last 72 hours. Hepatitis Panel No results for input(s): "HEPBSAG", "HCVAB", "HEPAIGM", "HEPBIGM" in the last 72 hours.   IMPRESSION:  #78 89 year old female with previous history of choledocholithiasis, and IPMN involving the entire main duct.  Who presents with recurrent abdominal pain and elevated LFTs concerning for recurrent choledocholithiasis  Patient does not meet SIRS/sepsis criteria  #2 status post remote cholecystectomy #3 esophageal lesion noted at EGD /EUS 2022-path consistent with invasive squamous cell carcinoma-see status post radiation//chemo-in remissio #4 history of prior pulmonary embolism-on chronic Xarelto last dose yesterday #5 diabetes mellitus #6.  Chronic pain syndrome on chronic narcotics/MS Contin No. 7 history of pancreatic insufficiency  PLAN: Full liquid diet today, n.p.o. after midnight Will review CT when available Start IV antibiotics/Zosyn Hold Xarelto-okay to continue heparin today,  heparin will need to be held for 6 hours prior to procedure tomorrow Will be scheduled for ERCP with Dr. Meridee Score tomorrow 10/29/2023 and early afternoon.  Procedure was discussed in detail with the patient, will also discuss with her daughter later today, reviewed indications risks and benefits in detail.   Tiersa Dayley EsterwoodPA-C  10/28/2023, 11:41 AM

## 2023-10-28 NOTE — Progress Notes (Signed)
Pharmacy Antibiotic Note  Sherry Holloway is a 89 y.o. female admitted on 10/27/2023 with intra-abdominal infection.  Pharmacy has been consulted for Zosyn dosing.  Plan: Zosyn 3.375g IV Q8H infused over 4hrs. Pharmacy will sign off notes, but will peripherally follow up renal function, culture results, and clinical course.   Height: 5\' 4"  (162.6 cm) Weight: 68 kg (150 lb) IBW/kg (Calculated) : 54.7  Temp (24hrs), Avg:98 F (36.7 C), Min:97.6 F (36.4 C), Max:98.6 F (37 C)  Recent Labs  Lab 10/27/23 1522 10/28/23 0444  WBC 13.7* 9.8  CREATININE 0.85 0.90    Estimated Creatinine Clearance: 37 mL/min (by C-G formula based on SCr of 0.9 mg/dL).    Allergies  Allergen Reactions   Gramicidin Other (See Comments)    Gramicidin is an antibiotic peptide that is used to treat local infections caused by gram-positive bacteria. Unknown reaction   Sulfa Antibiotics Rash   Wound Dressing Adhesive Rash and Other (See Comments)    Some dressings break out the skin    Antimicrobials this admission: 11/12 Zosyn >>   Dose adjustments this admission:   Microbiology results: none  Thank you for allowing pharmacy to be a part of this patient's care.  Lynann Beaver PharmD, BCPS [facility] main pharmacy [phone] 10/28/2023 10:21 AM

## 2023-10-28 NOTE — Plan of Care (Signed)
  Problem: Education: Goal: Ability to describe self-care measures that may prevent or decrease complications (Diabetes Survival Skills Education) will improve Outcome: Progressing   Problem: Coping: Goal: Ability to adjust to condition or change in health will improve Outcome: Progressing   Problem: Fluid Volume: Goal: Ability to maintain a balanced intake and output will improve Outcome: Progressing   Problem: Health Behavior/Discharge Planning: Goal: Ability to manage health-related needs will improve Outcome: Progressing   Problem: Metabolic: Goal: Ability to maintain appropriate glucose levels will improve Outcome: Progressing   Problem: Nutritional: Goal: Maintenance of adequate nutrition will improve Outcome: Progressing

## 2023-10-28 NOTE — Progress Notes (Signed)
PHARMACY - ANTICOAGULATION CONSULT NOTE  Pharmacy Consult for heparin  Indication: hx pulmonary embolus (PTA xarelto on hold)  Allergies  Allergen Reactions   Gramicidin Other (See Comments)    Gramicidin is an antibiotic peptide that is used to treat local infections caused by gram-positive bacteria. Unknown reaction   Sulfa Antibiotics Rash   Wound Dressing Adhesive Rash and Other (See Comments)    Some dressings break out the skin    Patient Measurements: height 64 inches , weight 68 kg Height: 5\' 4"  (162.6 cm) Weight: 68 kg (150 lb) IBW/kg (Calculated) : 54.7 Heparin Dosing Weight: 68 kg  Vital Signs: Temp: 98.6 F (37 C) (11/11 1924) Temp Source: Oral (11/11 1924) BP: 179/51 (11/11 1924) Pulse Rate: 75 (11/11 1924)  Labs: Recent Labs    10/27/23 1522 10/27/23 1950 10/28/23 0444  HGB 12.6  --  11.2*  HCT 40.4  --  34.4*  PLT 199  --  169  APTT  --  38* 84*  HEPARINUNFRC  --  0.72* 0.32  CREATININE 0.85  --   --   CKTOTAL  --  60  --   TROPONINIHS 11  --   --     Estimated Creatinine Clearance: 39.2 mL/min (by C-G formula based on SCr of 0.85 mg/dL).   Medical History: Past Medical History:  Diagnosis Date   Arthritis    Choledocholithiasis    Diabetes mellitus without complication (HCC)    Diverticulitis    Esophageal cancer (HCC) dx'd 2020   GERD (gastroesophageal reflux disease)    Hyperlipidemia    Hypertension    OSA (obstructive sleep apnea)    Urine incontinence    Uterine cancer (HCC)     Medications:  - on Xarelto 20 mg daily PTA (last dose taken on 10/26/23 at 7p)   Assessment: Patient is a 89 y.o F with hx PE on xarelto PTA who presented to the ED on 10/27/23 with c/o abdominal pain. Pharmacy has been consulted to transition anticoag. to heparin drip on admission.  Today, 10/28/2023: aPTT 84 therapeutic on 950 units/hr HL 0.32 Hgb 11.2, plts 169 No bleeding noted  - Goal of Therapy:  Heparin level 0.3-0.7 units/ml aPTT 66-102  seconds Monitor platelets by anticoagulation protocol: Yes   Plan:  - continue heparin drip at 950 units/hr - check 8 hr aptt and heparin level - monitor for s/sx bleeding   Arley Phenix RPh 10/28/2023, 5:17 AM

## 2023-10-28 NOTE — Progress Notes (Signed)
PHARMACY - ANTICOAGULATION CONSULT NOTE  Pharmacy Consult for heparin  Indication: hx pulmonary embolus (PTA xarelto on hold)  Allergies  Allergen Reactions   Gramicidin Other (See Comments)    Gramicidin is an antibiotic peptide that is used to treat local infections caused by gram-positive bacteria. Unknown reaction   Sulfa Antibiotics Rash   Wound Dressing Adhesive Rash and Other (See Comments)    Some dressings break out the skin    Patient Measurements:  Height: 5\' 4"  (162.6 cm) Weight: 68 kg (150 lb) IBW/kg (Calculated) : 54.7 Heparin Dosing Weight: TBW  Vital Signs: Temp: 98.6 F (37 C) (11/12 2128) Temp Source: Oral (11/12 2128) BP: 137/38 (11/12 2128) Pulse Rate: 56 (11/12 2128)  Labs: Recent Labs    10/27/23 1522 10/27/23 1950 10/27/23 1950 10/28/23 0444 10/28/23 1227 10/28/23 2149  HGB 12.6  --   --  11.2*  --   --   HCT 40.4  --   --  34.4*  --   --   PLT 199  --   --  169  --   --   APTT  --  38*  --  84* 67*  --   HEPARINUNFRC  --  0.72*   < > 0.32 0.24* 0.20*  CREATININE 0.85  --   --  0.90  --   --   CKTOTAL  --  60  --  50  --   --   TROPONINIHS 11  --   --   --   --   --    < > = values in this interval not displayed.    Estimated Creatinine Clearance: 37 mL/min (by C-G formula based on SCr of 0.9 mg/dL).   Medical History: Past Medical History:  Diagnosis Date   Arthritis    Choledocholithiasis    Diabetes mellitus without complication (HCC)    Diverticulitis    Esophageal cancer (HCC) dx'd 2020   GERD (gastroesophageal reflux disease)    Hyperlipidemia    Hypertension    OSA (obstructive sleep apnea)    Urine incontinence    Uterine cancer (HCC)     Medications:  - Xarelto 20 mg daily PTA (last dose taken on 10/26/23 at 7p)   Assessment: Patient is a 89 y.o F with hx PE on xarelto PTA who presented to the ED on 10/27/23 with c/o abdominal pain. Pharmacy has been consulted to transition anticoag. to heparin drip on  admission.  Today, 10/28/2023: aPTT 67, low end of therapeutic range on heparin 950 units/hr HL 0.24, subtherapeutic  CBC:  Hgb 11.2, plts 169 No bleeding or complications noted; no IV interruptions or pauses.    2149 HL 0.2 subtherapeutic on 1050 units/hr  - Goal of Therapy:  Heparin level 0.3-0.7 units/ml aPTT 66-102 seconds Monitor platelets by anticoagulation protocol: Yes   Plan:  - Increase to heparin drip to 1200 units/hr - monitor for s/sx bleeding  - HOLD heparin on 11/13 at 06:15 (6 hours prior to ERCP scheduled on 11/13 at 12:15)  Arley Phenix RPh 10/28/2023, 10:49 PM

## 2023-10-28 NOTE — H&P (View-Only) (Signed)
Consultation  Referring Provider: TRH/ Rai Primary Care Physician:  Zola Button, Grayling Congress, DO Primary Gastroenterologist:  Dr.Mansouraty  Reason for Consultation: Acute abdominal pain, elevated LFTs, history of choledocholithiasis  HPI: Sherry Holloway is a 89 y.o. female, known to Dr. Irish Lack Roddy from previous ERCP, last done in February 2023. Patient is status post remote cholecystectomy.  She was hospitalized in December 2022 with acute abdominal pain.  This is in the setting of previous ERCP and sphincterotomy done in 2020 while she was living in [place], then EUS/ERCP March 2022 which showed patent biliary sphincterotomy, severely dilated main bile duct, choledocholithiasis was found but complete removal was not accomplished with sweeping initially, she had sphincteroplasty done and then remainder of stones removed.  Also had spyglass with no other abnormalities noted. She had March 2022 EUS which showed a small submucosal ulcerating nodule with no bleeding or stigmata of bleeding in the midesophagus. Pancreatic duct was noted to be dilated in the pancreatic head genu of the pancreas and the body of the pancreas, dilated CBD at 10.3 mm.  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  Patient says that she has had a few recent episodes of epigastric pain that have been mild and short-lived.  However Monday she had an episode that was fairly severe and more prolonged lasting at least 4 hours.  She had not felt well since then and has had some nausea.  She was brought to the ER last evening and admitted.  She was unaware of any fever or chills at home.  She has not had any persistent ongoing pain but did have an episode of vomiting this morning He says these episodes are reminiscent of previous episodes with choledocholithiasis.  Ultrasound on admission showed her to be status  postcholecystectomy with CBD of 18 mm with filling defects up to 14 mm in the bile duct.   Labs today-WBC 9.8/hemoglobin 11.2/hematocrit 34.4 BUN 29/creatinine 0.91 T. bili up to 2.2 today/alk phos 310/AST 683/ALT 650  CT of the abdomen and pelvis has been ordered, done and report pending.   Past Medical History:  Diagnosis Date   Arthritis    Choledocholithiasis    Diabetes mellitus without complication (HCC)    Diverticulitis    Esophageal cancer (HCC) dx'd 2020   GERD (gastroesophageal reflux disease)    Hyperlipidemia    Hypertension    OSA (obstructive sleep apnea)    Urine incontinence    Uterine cancer [facility])     Past Surgical History:  Procedure Laterality Date   ABDOMINAL HYSTERECTOMY  1972   APPENDECTOMY     BILIARY DILATION  03/05/2021   Procedure: BILIARY DILATION;  Surgeon: Lemar Lofty., MD;  Location: [facility] ENDOSCOPY;  Service: Gastroenterology;;   BILIARY DILATION  11/27/2021   Procedure: BILIARY DILATION;  Surgeon: Rachael Fee, MD;  Location: [facility] ENDOSCOPY;  Service: Endoscopy;;   BILIARY DILATION  02/11/2022   Procedure: BILIARY DILATION;  Surgeon: Lemar Lofty., MD;  Location: [facility] ENDOSCOPY;  Service: Gastroenterology;;   BILIARY STENT PLACEMENT  11/27/2021   Procedure: BILIARY STENT PLACEMENT;  Surgeon: Rachael Fee, MD;  Location: [facility] ENDOSCOPY;  Service: Endoscopy;;   BIOPSY  03/05/2021   Procedure: BIOPSY;  Surgeon: Lemar Lofty., MD;  Location: [facility] ENDOSCOPY;  Service: Gastroenterology;;   BIOPSY  11/27/2021   Procedure: BIOPSY;  Surgeon: Rachael Fee, MD;  Location: [facility]  ENDOSCOPY;  Service: Endoscopy;;   CHOLECYSTECTOMY     ENDOSCOPIC RETROGRADE CHOLANGIOPANCREATOGRAPHY (ERCP) WITH PROPOFOL N/A 03/05/2021   Procedure: ENDOSCOPIC RETROGRADE CHOLANGIOPANCREATOGRAPHY (ERCP) WITH PROPOFOL;  Surgeon: Meridee Score Netty Starring., MD;  Location: [facility] ENDOSCOPY;  Service: Gastroenterology;  Laterality: N/A;   ENDOSCOPIC  RETROGRADE CHOLANGIOPANCREATOGRAPHY (ERCP) WITH PROPOFOL N/A 11/27/2021   Procedure: ENDOSCOPIC RETROGRADE CHOLANGIOPANCREATOGRAPHY (ERCP) WITH PROPOFOL;  Surgeon: Rachael Fee, MD;  Location: [facility] ENDOSCOPY;  Service: Endoscopy;  Laterality: N/A;  possible EGD prior   ENDOSCOPIC RETROGRADE CHOLANGIOPANCREATOGRAPHY (ERCP) WITH PROPOFOL N/A 02/11/2022   Procedure: ENDOSCOPIC RETROGRADE CHOLANGIOPANCREATOGRAPHY (ERCP) WITH PROPOFOL;  Surgeon: Meridee Score Netty Starring., MD;  Location: [facility] ENDOSCOPY;  Service: Gastroenterology;  Laterality: N/A;   ESOPHAGOGASTRODUODENOSCOPY N/A 11/27/2021   Procedure: ESOPHAGOGASTRODUODENOSCOPY (EGD);  Surgeon: Rachael Fee, MD;  Location: [facility] ENDOSCOPY;  Service: Endoscopy;  Laterality: N/A;   ESOPHAGOGASTRODUODENOSCOPY (EGD) WITH PROPOFOL N/A 03/05/2021   Procedure: ESOPHAGOGASTRODUODENOSCOPY (EGD) WITH PROPOFOL;  Surgeon: Meridee Score Netty Starring., MD;  Location: [facility] ENDOSCOPY;  Service: Gastroenterology;  Laterality: N/A;   EUS N/A 03/05/2021   Procedure: UPPER ENDOSCOPIC ULTRASOUND (EUS) RADIAL;  Surgeon: Meridee Score Netty Starring., MD;  Location: [facility] ENDOSCOPY;  Service: Gastroenterology;  Laterality: N/A;   LITHOTRIPSY  02/11/2022   Procedure: LITHOTRIPSY;  Surgeon: Mansouraty, Netty Starring., MD;  Location: [facility] ENDOSCOPY;  Service: Gastroenterology;;   LUMBAR LAMINECTOMY     REMOVAL OF STONES  03/05/2021   Procedure: REMOVAL OF STONES;  Surgeon: Lemar Lofty., MD;  Location: [facility] ENDOSCOPY;  Service: Gastroenterology;;   REMOVAL OF STONES  11/27/2021   Procedure: REMOVAL OF STONES;  Surgeon: Rachael Fee, MD;  Location: [facility] ENDOSCOPY;  Service: Endoscopy;;   REMOVAL OF STONES  02/11/2022   Procedure: REMOVAL OF STONES;  Surgeon: Lemar Lofty., MD;  Location: [facility] ENDOSCOPY;  Service: Gastroenterology;;   SPINAL CORD STIMULATOR IMPLANT     Not active any more.   SPYGLASS CHOLANGIOSCOPY N/A 03/05/2021   Procedure: SPYGLASS CHOLANGIOSCOPY;  Surgeon:  Lemar Lofty., MD;  Location: [facility] ENDOSCOPY;  Service: Gastroenterology;  Laterality: N/A;   SPYGLASS CHOLANGIOSCOPY N/A 02/11/2022   Procedure: SPYGLASS CHOLANGIOSCOPY;  Surgeon: Lemar Lofty., MD;  Location: [facility] ENDOSCOPY;  Service: Gastroenterology;  Laterality: N/A;   SPYGLASS LITHOTRIPSY N/A 03/05/2021   Procedure: [id number] LITHOTRIPSY;  Surgeon: Lemar Lofty., MD;  Location: [facility] ENDOSCOPY;  Service: Gastroenterology;  Laterality: N/A;   SPYGLASS LITHOTRIPSY N/A 02/11/2022   Procedure: [id number] LITHOTRIPSY;  Surgeon: Lemar Lofty., MD;  Location: [facility] ENDOSCOPY;  Service: Gastroenterology;  Laterality: N/A;   STENT REMOVAL  02/11/2022   Procedure: STENT REMOVAL;  Surgeon: Lemar Lofty., MD;  Location: [facility] ENDOSCOPY;  Service: Gastroenterology;;   TONSILLECTOMY     TOTAL KNEE ARTHROPLASTY Right     Prior to Admission medications   Medication Sig Start Date End Date Taking? Authorizing Provider  bumetanide (BUMEX) 1 MG tablet Take 0.5-1 tablets (0.5-1 mg total) by mouth daily as needed (swelling). Patient taking differently: Take 0.5 mg by mouth in the morning. 08/06/22  Yes Lowne [name], Yvonne R, DO  CREON 36000-114000 units CPEP capsule TAKE 1 CAPSULE THREE TIMES A DAY BEFORE MEALS Patient taking differently: Take 36,000 Units by mouth See admin instructions. Take 36,000 units by mouth before breakfast and supper/evening meal 05/05/23  Yes Lowne Florina Ou R, DO  docusate sodium (COLACE) 100 MG capsule Take 1 capsule (100 mg total) by mouth 2 (two) times daily. Patient taking differently: Take 100-200 mg by mouth See admin instructions. Take  200 mg by mouth in the morning and 100 mg in the evening 03/18/21  Yes Osvaldo Shipper, MD  famciclovir [facility]) 125 MG tablet TAKE 1 TABLET DAILY 03/31/23  Yes Seabron Spates R, DO  glimepiride (AMARYL) 4 MG tablet Take 1 tablet (4 mg total) by mouth 2 (two) times daily. 10/01/23  Yes Seabron Spates  R, DO  JARDIANCE 25 MG TABS tablet TAKE 1 TABLET DAILY BEFORE BREAKFAST 02/04/23  Yes Seabron Spates R, DO  latanoprost (XALATAN) 0.005 % ophthalmic solution Place 1 drop into both eyes at bedtime. 08/24/21  Yes Donato Schultz, DO  linaclotide [facility]) 145 MCG CAPS capsule Take 1 capsule (145 mcg total) by mouth daily before breakfast. 08/07/23  Yes Zola Button, Yvonne R, DO  mirtazapine (REMERON) 15 MG tablet TAKE 1 TABLET AT BEDTIME 04/16/23  Yes Seabron Spates R, DO  morphine (MS CONTIN) 30 MG 12 hr tablet Take 30 mg by mouth every 12 (twelve) hours. 01/21/22  Yes [provider]  Multiple Vitamin (MULTIVITAMIN WITH MINERALS) TABS tablet Take 1 tablet by mouth daily with supper.   Yes [provider]  omeprazole (PRILOSEC) 20 MG capsule Take 1 capsule (20 mg total) by mouth 2 (two) times daily before a meal. 09/29/23  Yes Lowne [name], Yvonne R, DO  senna-docusate (SENNA S) 8.6-50 MG tablet Take 1 tablet by mouth See admin instructions. Take 1 tablet by mouth in the morning and evening   Yes [provider]  TYLENOL 500 MG tablet Take 500-1,000 mg by mouth every 6 (six) hours as needed for mild pain (pain score 1-3) or headache.   Yes [provider]  valsartan (DIOVAN) 40 MG tablet TAKE 1 TABLET DAILY WITH BREAKFAST 01/20/23  Yes Lowne [name], Yvonne R, DO  XARELTO 20 MG TABS tablet TAKE 1 TABLET DAILY WITH SUPPER, START ONLY AFTER THE STARTER PACK HAS BEEN COMPLETED Patient taking differently: Take 20 mg by mouth daily with supper. 10/23/23  Yes Seabron Spates R, DO  glucose blood (FREESTYLE LITE) test strip CHECK BLOOD SUGAR TWICE A DAY 11/20/22   Zola Button, Grayling Congress, DO  senna (SENOKOT) 8.6 MG TABS tablet Take 1 tablet (8.6 mg total) by mouth daily. Patient not taking: Reported on 10/27/2023 11/29/21   Alba Cory, MD    Current Facility-Administered Medications  Medication Dose Route Frequency Provider Last Rate Last Admin   0.9 %   sodium chloride infusion   Intravenous Continuous Mansouraty, Netty Starring., MD       heparin ADULT infusion 100 units/mL (25000 units/268mL)  950 Units/hr Intravenous Continuous Lucia Gaskins, RPH 9.5 mL/hr at 10/27/23 2027 950 Units/hr at 10/27/23 2027   hydrALAZINE (APRESOLINE) injection 10 mg  10 mg Intravenous Q6H PRN Rai, Ripudeep K, MD       HYDROmorphone (DILAUDID) injection 0.5 mg  0.5 mg Intravenous Q3H PRN Gonfa, Taye T, MD       insulin aspart (novoLOG) injection 0-5 Units  0-5 Units Subcutaneous QHS Candelaria Stagers T, MD   2 Units at 10/27/23 2209   insulin aspart (novoLOG) injection 0-9 Units  0-9 Units Subcutaneous TID WC Gonfa, Taye T, MD       irbesartan (AVAPRO) tablet 75 mg  75 mg Oral Daily Candelaria Stagers T, MD   75 mg at 10/28/23 1136   lactated ringers infusion   Intravenous Continuous Rai, Ripudeep K, MD 75 mL/hr at 10/28/23 0801 New Bag at 10/28/23 0801   mirtazapine (  REMERON) tablet 15 mg  15 mg Oral QHS Candelaria Stagers T, MD   15 mg at 10/27/23 2207   morphine (MS CONTIN) 12 hr tablet 30 mg  30 mg Oral Q12H Gonfa, Taye T, MD   30 mg at 10/28/23 1136   ondansetron (ZOFRAN) tablet 4 mg  4 mg Oral Q6H PRN Almon Hercules, MD       Or   ondansetron (ZOFRAN) injection 4 mg  4 mg Intravenous Q6H PRN Candelaria Stagers T, MD   4 mg at 10/28/23 0857   piperacillin-tazobactam (ZOSYN) IVPB 3.375 g  3.375 g Intravenous Q8H Shade, Christine E, RPH 12.5 mL/hr at 10/28/23 1141 3.375 g at 10/28/23 1141    Allergies as of 10/27/2023 - Review Complete 10/27/2023  Allergen Reaction Noted   Gramicidin Other (See Comments) 01/17/2021   Sulfa antibiotics Rash 10/19/2020   Wound dressing adhesive Rash and Other (See Comments) 10/19/2020    Family History  Problem Relation Age of Onset   Diabetes Mother    Heart disease Father    Lung cancer Sister    Lung cancer Son    Hypertension Son    Hypertension Daughter    Diabetes Son    Hypertension Son    Diabetes Daughter    Hypertension Daughter      Social History   Socioeconomic History   Marital status: Widowed    Spouse name: Not on file   Number of children: 4   Years of education: Not on file   Highest education level: Not on file  Occupational History   Occupation: Retired Lawyer  Tobacco Use   Smoking status: Former    Current packs/day: 0.00    Average packs/day: 1 pack/day for 35.0 years (35.0 ttl pk-yrs)    Types: Cigarettes    Start date: 01/17/1954    Quit date: 01/17/1989    Years since quitting: 34.8   Smokeless tobacco: Never  Vaping Use   Vaping status: Never Used  Substance and Sexual Activity   Alcohol use: Not Currently   Drug use: Never   Sexual activity: Not Currently  Other Topics Concern   Not on file  Social History Narrative   Just moved from [place]--- and moved into an apt 2.4 miles from her daughter    Social Determinants of Health   Financial Resource Strain: Low Risk  (04/02/2022)   Overall Financial Resource Strain (CARDIA)    Difficulty of Paying Living Expenses: Not hard at all  Food Insecurity: No Food Insecurity (10/27/2023)   Hunger Vital Sign    Worried About Running Out of Food in the Last Year: Never true    Ran Out of Food in the Last Year: Never true  Transportation Needs: No Transportation Needs (10/27/2023)   PRAPARE - Administrator, Civil Service (Medical): No    Lack of Transportation (Non-Medical): No  Physical Activity: Sufficiently Active (04/02/2022)   Exercise Vital Sign    Days of Exercise per Week: 5 days    Minutes of Exercise per Session: 60 min  Stress: No Stress Concern Present (04/02/2022)   [employer] of Occupational Health - Occupational Stress Questionnaire    Feeling of Stress : Not at all  Social Connections: Moderately Isolated (04/02/2022)   Social Connection and Isolation Panel [NHANES]    Frequency of Communication with Friends and Family: More than three times a week    Frequency of Social Gatherings with Friends and Family: Three  times a week  Attends Religious Services: More than 4 times per year    Active Member of Clubs or Organizations: No    Attends Banker Meetings: Never    Marital Status: Widowed  Intimate Partner Violence: Not At Risk (10/27/2023)   Humiliation, Afraid, Rape, and Kick questionnaire    Fear of Current or Ex-Partner: No    Emotionally Abused: No    Physically Abused: No    Sexually Abused: No    Review of Systems: Pertinent positive and negative review of systems were noted in the above HPI section.  All other review of systems was otherwise negative.   Physical Exam: Vital signs in last 24 hours: Temp:  [97.6 F (36.4 C)-98.6 F (37 C)] 98.2 F (36.8 C) (11/12 1053) Pulse Rate:  [57-82] 57 (11/12 1053) Resp:  [17-20] 18 (11/12 1053) BP: (122-179)/(39-145) 138/39 (11/12 1053) SpO2:  [91 %-99 %] 97 % (11/12 1053) Weight:  [68 kg] 68 kg (11/11 1852) Last BM Date : 10/27/23 General:   Alert,  Well-developed, well-nourished, very elderly female pleasant and cooperative in NAD, sitting up in the chair Head:  Normocephalic and atraumatic. Eyes:  Sclera clear, no icterus.   Conjunctiva pink. Ears:  Normal auditory acuity. Nose:  No deformity, discharge,  or lesions. Mouth:  No deformity or lesions.   Neck:  Supple; no masses or thyromegaly. Lungs:  Clear throughout to auscultation.   No wheezes, crackles, or rhonchi.  Heart:  Regular rate and rhythm; no murmurs, clicks, rubs,  or gallops. Abdomen:  Soft, very mildly tender in the epigastrium, no guarding or rebound BS active,nonpalp mass or hsm.   Rectal: Not done Msk:  Symmetrical without gross deformities. . Pulses:  Normal pulses noted. Extremities:  Without clubbing or edema. Neurologic:  Alert and  oriented x4;  grossly normal neurologically. Skin:  Intact without significant lesions or rashes.. Psych:  Alert and cooperative. Normal mood and affect.  Intake/Output from previous day: 11/11 0701 - 11/12  0700 In: 86.7 [P.O.:15; I.V.:71.7] Out: 0  Intake/Output this shift: No intake/output data recorded.  Lab Results: Recent Labs    10/27/23 1522 10/28/23 0444  WBC 13.7* 9.8  HGB 12.6 11.2*  HCT 40.4 34.4*  PLT 199 169   BMET Recent Labs    10/27/23 1522 10/28/23 0444  NA 137 136  K 4.2 3.9  CL 107 108  CO2 22 21*  GLUCOSE 201* 100*  BUN 28* 29*  CREATININE 0.85 0.90  CALCIUM 9.6 8.8*   LFT Recent Labs    10/28/23 0444  PROT 5.7*  ALBUMIN 2.9*  AST 683*  ALT 650*  ALKPHOS 310*  BILITOT 2.2*   PT/INR No results for input(s): "LABPROT", "INR" in the last 72 hours. Hepatitis Panel No results for input(s): "HEPBSAG", "HCVAB", "HEPAIGM", "HEPBIGM" in the last 72 hours.   IMPRESSION:  #78 89 year old female with previous history of choledocholithiasis, and IPMN involving the entire main duct.  Who presents with recurrent abdominal pain and elevated LFTs concerning for recurrent choledocholithiasis  Patient does not meet SIRS/sepsis criteria  #2 status post remote cholecystectomy #3 esophageal lesion noted at EGD /EUS 2022-path consistent with invasive squamous cell carcinoma-see status post radiation//chemo-in remissio #4 history of prior pulmonary embolism-on chronic Xarelto last dose yesterday #5 diabetes mellitus #6.  Chronic pain syndrome on chronic narcotics/MS Contin No. 7 history of pancreatic insufficiency  PLAN: Full liquid diet today, n.p.o. after midnight Will review CT when available Start IV antibiotics/Zosyn Hold Xarelto-okay to continue heparin today,  heparin will need to be held for 6 hours prior to procedure tomorrow Will be scheduled for ERCP with Dr. Meridee Score tomorrow 10/29/2023 and early afternoon.  Procedure was discussed in detail with the patient, will also discuss with her daughter later today, reviewed indications risks and benefits in detail.   Tiersa Dayley EsterwoodPA-C  10/28/2023, 11:41 AM

## 2023-10-28 NOTE — Progress Notes (Signed)
PHARMACY - ANTICOAGULATION CONSULT NOTE  Pharmacy Consult for heparin  Indication: hx pulmonary embolus (PTA xarelto on hold)  Allergies  Allergen Reactions   Gramicidin Other (See Comments)    Gramicidin is an antibiotic peptide that is used to treat local infections caused by gram-positive bacteria. Unknown reaction   Sulfa Antibiotics Rash   Wound Dressing Adhesive Rash and Other (See Comments)    Some dressings break out the skin    Patient Measurements:  Height: 5\' 4"  (162.6 cm) Weight: 68 kg (150 lb) IBW/kg (Calculated) : 54.7 Heparin Dosing Weight: TBW  Vital Signs: Temp: 98.2 F (36.8 C) (11/12 1053) Temp Source: Oral (11/12 1053) BP: 138/39 (11/12 1053) Pulse Rate: 57 (11/12 1053)  Labs: Recent Labs    10/27/23 1522 10/27/23 1950 10/28/23 0444  HGB 12.6  --  11.2*  HCT 40.4  --  34.4*  PLT 199  --  169  APTT  --  38* 84*  HEPARINUNFRC  --  0.72* 0.32  CREATININE 0.85  --  0.90  CKTOTAL  --  60 50  TROPONINIHS 11  --   --     Estimated Creatinine Clearance: 37 mL/min (by C-G formula based on SCr of 0.9 mg/dL).   Medical History: Past Medical History:  Diagnosis Date   Arthritis    Choledocholithiasis    Diabetes mellitus without complication (HCC)    Diverticulitis    Esophageal cancer (HCC) dx'd 2020   GERD (gastroesophageal reflux disease)    Hyperlipidemia    Hypertension    OSA (obstructive sleep apnea)    Urine incontinence    Uterine cancer (HCC)     Medications:  - Xarelto 20 mg daily PTA (last dose taken on 10/26/23 at 7p)   Assessment: Patient is a 89 y.o F with hx PE on xarelto PTA who presented to the ED on 10/27/23 with c/o abdominal pain. Pharmacy has been consulted to transition anticoag. to heparin drip on admission.  Today, 10/28/2023: aPTT 67, low end of therapeutic range on heparin 950 units/hr HL 0.24, subtherapeutic  CBC:  Hgb 11.2, plts 169 No bleeding or complications noted; no IV interruptions or pauses.    -  Goal of Therapy:  Heparin level 0.3-0.7 units/ml aPTT 66-102 seconds Monitor platelets by anticoagulation protocol: Yes   Plan:  - Increase to heparin drip at 1050 units/hr - check 8 hr aptt and heparin level - monitor for s/sx bleeding  - HOLD heparin on 11/13 at 06:15 (6 hours prior to ERCP scheduled on 11/13 at 12:15)  Lynann Beaver PharmD, BCPS [facility] main pharmacy [phone] 10/28/2023 1:13 PM

## 2023-10-28 NOTE — Progress Notes (Signed)
[facility]                                                                              Sherry Holloway, is a 89 y.o. female, DOB - 01-Sep-1928, IRJ:[id number] Admit date - 10/27/2023    Outpatient Primary MD for the patient is Sherry Holloway, Grayling Congress, DO  LOS - 0  days  Chief Complaint  Patient presents with   Abdominal Pain       Brief summary   Patient is a 89 year old female with esophageal CA in remission, choledocholithiasis previously treated by ERCP in 01/2022, DM type II, PE on Xarelto, chronic pain, HTN, pancreatic insufficiency presented to ED with severe abdominal pain.  Patient had 2 brief episodes of abdominal pain about 2 weeks ago that lasted few minutes and resolved.  On the day of admission, she started having LUQ abdominal pain, gradually got worse and became intense.  Pain improved with IV morphine in ED.  No hematochezia or melena, fevers, chest pain or shortness of breath.  Last dose of Xarelto was the evening of 11/10. In ED, temp 97.8 F, RR 20, pulse 63, BP 174/145 AST 312, ALT 163, alk phos 265, total bilirubin 1.4 WB 13.7, hemoglobin 12.6   Assessment & Plan    Principal Problem: Acute abdominal pain with transaminitis, choledocholithiasis -Currently pain improving, had a regular BM on the day of admission, recurrent episodes -RUQ US showed dilated common bile duct measuring 18 mm with any defects in the CBD, measuring up to 14 mm concerning for choledocholithiasis, intrahepatic biliary duct dilation, s/p cholecystectomy -Patient has spinal stimulator, unable to do MRCP -Currently n.p.o., IVF, GI consulted, discussed with Dr. Meridee Score, needs CT abdomen pancreas protocol, will need ERCP, recommended IV antibiotics to prevent cholangitis. -Continue pain control, will follow GI recommendations.  Active Problems:   Uncontrolled type 2 diabetes mellitus with hyperglycemia, without long-term current use of insulin (HCC) -Hemoglobin A1c 7.2 on  08/04/2023 -Hold Jardiance, glimepiride -Currently np.o., continue sliding scale insulin    Chronic bilateral back pain -Currently stable, patient has spinal stimulator - continue MS Contin (outpatient dose), IV Dilaudid as needed    History of pulmonary embolism -On Xarelto at home, awaiting ERCP, will place on IV heparin for now  Hypertension -Continue Avapro, placed on IV hydralazine as needed with parameters   History of diastolic CHF -Currently compensated, n.p.o. -Hold Bumex -2D echo 07/2023 had shown EF of 65 to 70%, elevated left ventricular end-diastolic pressure   Estimated body mass index is 25.75 kg/m as calculated from the following:   Height as of this encounter: 5\' 4"  (1.626 m).   Weight as of this encounter: 68 kg.  Code Status: DNR DVT Prophylaxis:  IV heparin   Level of Care: Level of care: Med-Surg Family Communication: Updated patient's daughter at the bedside Disposition Plan:      Remains inpatient appropriate:      Procedures:    Consultants:   Gastroenterology  Antimicrobials:   Anti-infectives (From admission, onward)    None          Medications  insulin aspart  0-5 Units Subcutaneous QHS  insulin aspart  0-9 Units Subcutaneous TID WC   irbesartan  75 mg Oral Daily   mirtazapine  15 mg Oral QHS   morphine  30 mg Oral Q12H      Subjective:   Sherry Holloway was seen and examined today.  Sitting up in the chair, abdominal pain currently controlled, no acute nausea vomiting.  No acute chest pain, shortness of breath, dizziness or lightheadedness.  No acute events overnight.    Objective:   Vitals:   10/27/23 1811 10/27/23 1852 10/27/23 1924 10/28/23 0602  BP:   (!) 179/51 (!) 122/47  Pulse:   75 61  Resp:   17 17  Temp: 97.6 F (36.4 C)  98.6 F (37 C) 98 F (36.7 C)  TempSrc:   Oral Oral  SpO2:   99% 95%  Weight: 68 kg 68 kg    Height:  5\' 4"  (1.626 m)      Intake/Output Summary (Last 24 hours) at 10/28/2023  1017 Last data filed at 10/28/2023 0935 Gross per 24 hour  Intake 106.65 ml  Output 0 ml  Net 106.65 ml     Wt Readings from Last 3 Encounters:  10/27/23 68 kg  10/13/23 68 kg  09/10/23 68.9 kg     Exam General: Alert and oriented x 3, NAD Cardiovascular: S1 S2 auscultated,  RRR Respiratory: Clear to auscultation bilaterally, no wheezing Gastrointestinal: Soft, nontender, mildly distended, + bowel sounds Ext: trace pedal edema bilaterally Neuro: no new deficits Psych: Normal affect     Data Reviewed:  I have personally reviewed following labs    CBC Lab Results  Component Value Date   WBC 9.8 10/28/2023   RBC 3.91 10/28/2023   HGB 11.2 (L) 10/28/2023   HCT 34.4 (L) 10/28/2023   MCV 88.0 10/28/2023   MCH 28.6 10/28/2023   PLT 169 10/28/2023   MCHC 32.6 10/28/2023   RDW 14.6 10/28/2023   LYMPHSABS 0.7 10/27/2023   MONOABS 0.4 10/27/2023   EOSABS 0.0 10/27/2023   BASOSABS 0.0 10/27/2023     Last metabolic panel Lab Results  Component Value Date   NA 136 10/28/2023   K 3.9 10/28/2023   CL 108 10/28/2023   CO2 21 (L) 10/28/2023   BUN 29 (H) 10/28/2023   CREATININE 0.90 10/28/2023   GLUCOSE 100 (H) 10/28/2023   GFRNONAA 60 (L) 10/28/2023   CALCIUM 8.8 (L) 10/28/2023   PHOS 2.5 11/28/2021   PROT 5.7 (L) 10/28/2023   ALBUMIN 2.9 (L) 10/28/2023   BILITOT 2.2 (H) 10/28/2023   ALKPHOS 310 (H) 10/28/2023   AST 683 (H) 10/28/2023   ALT 650 (H) 10/28/2023   ANIONGAP 7 10/28/2023    CBG (last 3)  Recent Labs    10/27/23 2145 10/28/23 0733  GLUCAP 206* 70      Coagulation Profile: No results for input(s): "INR", "PROTIME" in the last 168 hours.   Radiology Studies: I have personally reviewed the imaging studies  US Abdomen Limited RUQ (LIVER/GB)  Result Date: 10/27/2023 CLINICAL DATA:  Elevated liver enzymes. EXAM: ULTRASOUND ABDOMEN LIMITED RIGHT UPPER QUADRANT COMPARISON:  None Available. FINDINGS: Gallbladder: Gallbladder is surgically  absent. Common bile duct: Diameter: Dilated measuring 18 mm. There are filling defects/echogenic areas within the common bile duct measuring up to 14 mm. There is also intrahepatic biliary ductal dilatation. Liver: No focal lesion identified. Mildly diffusely heterogeneous echotexture. Within normal limits in parenchymal echogenicity. Portal vein is patent on color Doppler imaging with normal direction of blood flow  towards the liver. Other: None. IMPRESSION: 1. Dilated common bile duct measuring 18 mm with filling defects/echogenic areas within the common bile duct measuring up to 14 mm. Findings are concerning for choledocholithiasis. Recommend further evaluation with MRCP. 2. Intrahepatic biliary ductal dilatation. 3. Status post cholecystectomy. Electronically Signed   By: Darliss Cheney M.D.   On: 10/27/2023 23:11       Talyssa Gibas M.D. [facility] 10/28/2023, 10:17 AM  Available via Epic secure chat 7am-7pm After 7 pm, please refer to night coverage provider listed on amion.  CBD

## 2023-10-28 NOTE — Inpatient Diabetes Management (Signed)
Inpatient Diabetes Program Recommendations  AACE/ADA: New Consensus Statement on Inpatient Glycemic Control (2015)  Target Ranges:  Prepandial:   less than 140 mg/dL      Peak postprandial:   less than 180 mg/dL (1-2 hours)      Critically ill patients:  140 - 180 mg/dL   Lab Results  Component Value Date   GLUCAP 61 (L) 10/28/2023   HGBA1C 7.2 (H) 08/04/2023    Review of Glycemic Control  Latest Reference Range & Units 10/27/23 21:45 10/28/23 07:33 10/28/23 11:24  Glucose-Capillary 70 - 99 mg/dL 161 (H)  Novolog 2 units 70 61 (L)  (H): Data is abnormally high (L): Data is abnormally low  Diabetes history: DM Outpatient Diabetes medications: Jardiance 25 mg every day, Amaryl 4 mg QD Current orders for Inpatient glycemic control: Novolog 0-6 units TID  Inpatient Diabetes Program Recommendations:    Hypoglycemia today after receiving 2 units of Novolog last night.  Could order a custom correction scale to start correction when glucose is >200 mg/dL.    Will continue to follow while inpatient.  Thank you, Dulce Sellar, MSN, CDCES Diabetes Coordinator Inpatient Diabetes Program [phone] (team pager from 8a-5p)

## 2023-10-29 ENCOUNTER — Encounter (HOSPITAL_COMMUNITY): Payer: Self-pay | Admitting: Internal Medicine

## 2023-10-29 ENCOUNTER — Telehealth: Payer: Self-pay

## 2023-10-29 ENCOUNTER — Encounter (HOSPITAL_COMMUNITY)
Admission: EM | Disposition: A | Discharge status: Home / Self Care | Payer: Self-pay | Source: Home / Self Care | Attending: Internal Medicine

## 2023-10-29 ENCOUNTER — Inpatient Hospital Stay (HOSPITAL_COMMUNITY): Payer: Medicare Other

## 2023-10-29 DIAGNOSIS — R7989 Other specified abnormal findings of blood chemistry: Secondary | ICD-10-CM | POA: Diagnosis not present

## 2023-10-29 DIAGNOSIS — C154 Malignant neoplasm of middle third of esophagus: Secondary | ICD-10-CM

## 2023-10-29 DIAGNOSIS — K862 Cyst of pancreas: Secondary | ICD-10-CM | POA: Diagnosis not present

## 2023-10-29 DIAGNOSIS — K8051 Calculus of bile duct without cholangitis or cholecystitis with obstruction: Secondary | ICD-10-CM | POA: Diagnosis not present

## 2023-10-29 DIAGNOSIS — K2970 Gastritis, unspecified, without bleeding: Secondary | ICD-10-CM | POA: Diagnosis not present

## 2023-10-29 DIAGNOSIS — D490 Neoplasm of unspecified behavior of digestive system: Secondary | ICD-10-CM

## 2023-10-29 DIAGNOSIS — K259 Gastric ulcer, unspecified as acute or chronic, without hemorrhage or perforation: Secondary | ICD-10-CM | POA: Diagnosis not present

## 2023-10-29 DIAGNOSIS — G8929 Other chronic pain: Secondary | ICD-10-CM

## 2023-10-29 DIAGNOSIS — R1010 Upper abdominal pain, unspecified: Secondary | ICD-10-CM

## 2023-10-29 DIAGNOSIS — K838 Other specified diseases of biliary tract: Secondary | ICD-10-CM | POA: Diagnosis not present

## 2023-10-29 HISTORY — PX: REMOVAL OF STONES: SHX5545

## 2023-10-29 HISTORY — PX: BILIARY STENT PLACEMENT: SHX5538

## 2023-10-29 HISTORY — PX: BALLOON DILATION: SHX5330

## 2023-10-29 HISTORY — PX: BIOPSY: SHX5522

## 2023-10-29 HISTORY — PX: LITHOTRIPSY: SHX5546

## 2023-10-29 HISTORY — PX: ESOPHAGOGASTRODUODENOSCOPY: SHX5428

## 2023-10-29 HISTORY — PX: ERCP: SHX5425

## 2023-10-29 LAB — COMPREHENSIVE METABOLIC PANEL
ALT: 356 U/L — ABNORMAL HIGH (ref 0–44)
AST: 143 U/L — ABNORMAL HIGH (ref 15–41)
Albumin: 2.5 g/dL — ABNORMAL LOW (ref 3.5–5.0)
Alkaline Phosphatase: 232 U/L — ABNORMAL HIGH (ref 38–126)
Anion gap: 6 (ref 5–15)
BUN: 18 mg/dL (ref 8–23)
CO2: 21 mmol/L — ABNORMAL LOW (ref 22–32)
Calcium: 8.3 mg/dL — ABNORMAL LOW (ref 8.9–10.3)
Chloride: 103 mmol/L (ref 98–111)
Creatinine, Ser: 0.91 mg/dL (ref 0.44–1.00)
GFR, Estimated: 59 mL/min — ABNORMAL LOW (ref >60)
Glucose, Bld: 85 mg/dL (ref 70–99)
Potassium: 3.8 mmol/L (ref 3.5–5.1)
Sodium: 130 mmol/L — ABNORMAL LOW (ref 135–145)
Total Bilirubin: 1.5 mg/dL — ABNORMAL HIGH (ref <1.2)
Total Protein: 5.1 g/dL — ABNORMAL LOW (ref 6.5–8.1)

## 2023-10-29 LAB — CBC
HCT: 31 % — ABNORMAL LOW (ref 36.0–46.0)
Hemoglobin: 10.1 g/dL — ABNORMAL LOW (ref 12.0–15.0)
MCH: 28.5 pg (ref 26.0–34.0)
MCHC: 32.6 g/dL (ref 30.0–36.0)
MCV: 87.6 fL (ref 80.0–100.0)
Platelets: 138 10*3/uL — ABNORMAL LOW (ref 150–400)
RBC: 3.54 MIL/uL — ABNORMAL LOW (ref 3.87–5.11)
RDW: 14.9 % (ref 11.5–15.5)
WBC: 6.5 10*3/uL (ref 4.0–10.5)
nRBC: 0 % (ref 0.0–0.2)

## 2023-10-29 LAB — GLUCOSE, CAPILLARY
Glucose-Capillary: 119 mg/dL — ABNORMAL HIGH (ref 70–99)
Glucose-Capillary: 95 mg/dL (ref 70–99)
Glucose-Capillary: 78 mg/dL (ref 70–99)
Glucose-Capillary: 178 mg/dL — ABNORMAL HIGH (ref 70–99)

## 2023-10-29 SURGERY — ENDOSCOPIC RETROGRADE CHOLANGIOPANCREATOGRAPHY (ERCP)
Anesthesia: General

## 2023-10-29 SURGERY — ERCP, WITH INTERVENTION IF INDICATED
Anesthesia: General

## 2023-10-29 SURGERY — FULL UPPER ENDOSCOPIC ULTRASOUND (EUS) RADIAL
Anesthesia: Monitor Anesthesia Care

## 2023-10-29 MED ORDER — SUCRALFATE 1 G PO TABS
1.0000 g | ORAL_TABLET | Freq: Two times a day (BID) | ORAL | Status: DC
  Administered 2023-10-29 – 2023-10-31 (×4): 1 g via ORAL
  Filled 2023-10-29 (×4): qty 1

## 2023-10-29 MED ORDER — VALACYCLOVIR HCL 500 MG PO TABS
1000.0000 mg | ORAL_TABLET | Freq: Every day | ORAL | Status: DC
  Administered 2023-10-29 – 2023-10-31 (×3): 1000 mg via ORAL
  Filled 2023-10-29 (×3): qty 2

## 2023-10-29 MED ORDER — HEPARIN (PORCINE) 25000 UT/250ML-% IV SOLN
1400.0000 [IU]/h | INTRAVENOUS | Status: DC
  Administered 2023-10-30 – 2023-10-31 (×2): 1350 [IU]/h via INTRAVENOUS
  Filled 2023-10-29 (×2): qty 250

## 2023-10-29 MED ORDER — DOCUSATE SODIUM 100 MG PO CAPS
100.0000 mg | ORAL_CAPSULE | Freq: Two times a day (BID) | ORAL | Status: DC
  Administered 2023-10-29 – 2023-10-31 (×4): 100 mg via ORAL
  Filled 2023-10-29 (×4): qty 1

## 2023-10-29 MED ORDER — PANTOPRAZOLE SODIUM 40 MG PO TBEC
40.0000 mg | DELAYED_RELEASE_TABLET | Freq: Two times a day (BID) | ORAL | Status: DC
  Administered 2023-10-29 – 2023-10-31 (×4): 40 mg via ORAL
  Filled 2023-10-29 (×4): qty 1

## 2023-10-29 MED ORDER — LIDOCAINE 2% (20 MG/ML) 5 ML SYRINGE
INTRAMUSCULAR | Status: DC | PRN
  Administered 2023-10-29: 60 mg via INTRAVENOUS

## 2023-10-29 MED ORDER — FENTANYL CITRATE (PF) 100 MCG/2ML IJ SOLN
INTRAMUSCULAR | Status: DC | PRN
  Administered 2023-10-29 (×4): 25 ug via INTRAVENOUS

## 2023-10-29 MED ORDER — CIPROFLOXACIN HCL 500 MG PO TABS
500.0000 mg | ORAL_TABLET | Freq: Two times a day (BID) | ORAL | Status: DC
  Administered 2023-10-29 – 2023-10-31 (×4): 500 mg via ORAL
  Filled 2023-10-29 (×4): qty 1

## 2023-10-29 MED ORDER — SUGAMMADEX SODIUM 200 MG/2ML IV SOLN
INTRAVENOUS | Status: DC | PRN
  Administered 2023-10-29: 150 mg via INTRAVENOUS

## 2023-10-29 MED ORDER — FENTANYL CITRATE (PF) 100 MCG/2ML IJ SOLN
INTRAMUSCULAR | Status: AC
  Filled 2023-10-29: qty 2

## 2023-10-29 MED ORDER — DICLOFENAC SUPPOSITORY 100 MG
RECTAL | Status: AC
  Filled 2023-10-29: qty 1

## 2023-10-29 MED ORDER — ROCURONIUM BROMIDE 10 MG/ML (PF) SYRINGE
PREFILLED_SYRINGE | INTRAVENOUS | Status: DC | PRN
  Administered 2023-10-29: 50 mg via INTRAVENOUS

## 2023-10-29 MED ORDER — LINACLOTIDE 145 MCG PO CAPS
145.0000 ug | ORAL_CAPSULE | Freq: Every day | ORAL | Status: DC
  Administered 2023-10-30 – 2023-10-31 (×2): 145 ug via ORAL
  Filled 2023-10-29 (×2): qty 1

## 2023-10-29 MED ORDER — SENNA 8.6 MG PO TABS
1.0000 | ORAL_TABLET | Freq: Every day | ORAL | Status: DC
  Administered 2023-10-29 – 2023-10-30 (×2): 8.6 mg via ORAL
  Filled 2023-10-29 (×2): qty 1

## 2023-10-29 MED ORDER — DEXTROSE-SODIUM CHLORIDE 5-0.45 % IV SOLN
INTRAVENOUS | Status: DC | PRN
  Administered 2023-10-29: 10 mL/h via INTRAVENOUS

## 2023-10-29 MED ORDER — ONDANSETRON HCL 4 MG/2ML IJ SOLN
INTRAMUSCULAR | Status: DC | PRN
  Administered 2023-10-29: 4 mg via INTRAVENOUS

## 2023-10-29 MED ORDER — SODIUM CHLORIDE 0.9 % IV SOLN
INTRAVENOUS | Status: DC | PRN
  Administered 2023-10-29: 30 mL

## 2023-10-29 MED ORDER — GLUCAGON HCL RDNA (DIAGNOSTIC) 1 MG IJ SOLR
INTRAMUSCULAR | Status: AC
  Filled 2023-10-29: qty 1

## 2023-10-29 MED ORDER — PANCRELIPASE (LIP-PROT-AMYL) 12000-38000 UNITS PO CPEP
36000.0000 [IU] | ORAL_CAPSULE | Freq: Two times a day (BID) | ORAL | Status: DC
  Administered 2023-10-30 – 2023-10-31 (×3): 36000 [IU] via ORAL
  Filled 2023-10-29 (×4): qty 3

## 2023-10-29 MED ORDER — GLUCAGON HCL RDNA (DIAGNOSTIC) 1 MG IJ SOLR
INTRAMUSCULAR | Status: DC | PRN
Start: 2023-10-29 — End: 2023-10-29
  Administered 2023-10-29 (×3): .25 mg via INTRAVENOUS

## 2023-10-29 MED ORDER — EPHEDRINE SULFATE-NACL 50-0.9 MG/10ML-% IV SOSY
PREFILLED_SYRINGE | INTRAVENOUS | Status: DC | PRN
Start: 2023-10-29 — End: 2023-10-29
  Administered 2023-10-29: 10 mg via INTRAVENOUS

## 2023-10-29 MED ORDER — PROPOFOL 10 MG/ML IV BOLUS
INTRAVENOUS | Status: DC | PRN
  Administered 2023-10-29: 100 mg via INTRAVENOUS

## 2023-10-29 MED ORDER — DICLOFENAC SUPPOSITORY 100 MG
RECTAL | Status: DC | PRN
  Administered 2023-10-29: 100 mg via RECTAL

## 2023-10-29 MED ORDER — DEXTROSE-SODIUM CHLORIDE 5-0.45 % IV SOLN: INTRAVENOUS | Status: DC

## 2023-10-29 NOTE — Anesthesia Postprocedure Evaluation (Signed)
Anesthesia Post Note  Patient: Sherry Holloway  Procedure(s) Performed: ENDOSCOPIC RETROGRADE CHOLANGIOPANCREATOGRAPHY (ERCP) ESOPHAGOGASTRODUODENOSCOPY (EGD) BIOPSY Billiary BALLOON DILATION REMOVAL OF STONES and sludge BILIARY STENT PLACEMENT manual LITHOTRIPSY     Patient location during evaluation: Endoscopy Anesthesia Type: General Level of consciousness: oriented, awake and alert and awake Pain management: pain level controlled Vital Signs Assessment: post-procedure vital signs reviewed and stable Respiratory status: spontaneous breathing, nonlabored ventilation and respiratory function stable Cardiovascular status: blood pressure returned to baseline and stable Postop Assessment: no headache, no backache and no apparent nausea or vomiting Anesthetic complications: no Comments: Right knee hematoma. Questionable if new vs old. Proceduralist to follow up with primary team to discuss treatment.   No notable events documented.  Last Vitals:  Vitals:   10/29/23 1500 10/29/23 1510  BP: (!) 164/43 (!) 159/35  Pulse: (!) 59 (!) 58  Resp: 11 12  Temp:    SpO2: 93% 91%    Last Pain:  Vitals:   10/29/23 1510  TempSrc:   PainSc: 0-No pain                 Collene Schlichter

## 2023-10-29 NOTE — Transfer of Care (Signed)
Immediate Anesthesia Transfer of Care Note  Patient: Sherry Holloway  Procedure(s) Performed: ENDOSCOPIC RETROGRADE CHOLANGIOPANCREATOGRAPHY (ERCP) ESOPHAGOGASTRODUODENOSCOPY (EGD) BIOPSY Billiary BALLOON DILATION REMOVAL OF STONES and sludge BILIARY STENT PLACEMENT  Patient Location: PACU  Anesthesia Type:General  Level of Consciousness: drowsy  Airway & Oxygen Therapy: Patient Spontanous Breathing and Patient connected to face mask oxygen  Post-op Assessment: Report given to RN, Post -op Vital signs reviewed and stable, and Patient moving all extremities X 4  Post vital signs: Reviewed and stable  Last Vitals:  Vitals Value Taken Time  BP 119/73 10/29/23 1440  Temp    Pulse 65 10/29/23 1443  Resp 13 10/29/23 1443  SpO2 100 % 10/29/23 1443  Vitals shown include unfiled device data.  Last Pain:  Vitals:   10/29/23 1139  TempSrc: Tympanic  PainSc: 0-No pain      Patients Stated Pain Goal: 0 (10/29/23 0929)  Complications: No notable events documented.

## 2023-10-29 NOTE — Op Note (Addendum)
[facility] Patient Name: Sherry Holloway Procedure Date: 10/29/2023 MRN: [id number] Attending MD: Corliss Parish , MD, [id number] Date of Birth: 1928/12/13 CSN: [id number] Age: 89 Admit Type: Inpatient Procedure:                ERCP Indications:              Bile duct stone(s), Biliary dilation on Computed                            Tomogram Scan, Abnormal abdominal ultrasound,                            Elevated liver enzymes, Prior Endoscopic Retrograde                            Cholangiopancreatography Providers:                Corliss Parish, MD, Stephens Shire RN, RN, Suzy Bouchard, RN, Kandice Robinsons, Technician, Derinda Late, Technician Referring MD:             Inpatient Medical Service Medicines:                General Anesthesia, Indomethacin 100 mg PR,                            Glucagon 0.5 mg IV, She is already on scheduled                            antibiotics Complications:            No immediate complications. Estimated Blood Loss:     Estimated blood loss was minimal. Procedure:                Pre-Anesthesia Assessment:                           - Prior to the procedure, a History and Physical                            was performed, and patient medications and                            allergies were reviewed. The patient's tolerance of                            previous anesthesia was also reviewed. The risks                            and benefits of the procedure and the sedation                            options and risks were discussed with  the patient.                            All questions were answered, and informed consent                            was obtained. Prior Anticoagulants: The patient has                            taken heparin, last dose was day of procedure. ASA                            Grade Assessment: III - A patient with severe                             systemic disease. After reviewing the risks and                            benefits, the patient was deemed in satisfactory                            condition to undergo the procedure.                           After obtaining informed consent, the scope was                            passed under direct vision. Throughout the                            procedure, the patient's blood pressure, pulse, and                            oxygen saturations were monitored continuously. The                            [id number]-H190 ([id number]) Olympus endoscope was introduced                            through the mouth, and used to inject contrast into                            and used to locate the major papilla. The [id number]-[id number]                            ([id number]) Olympus duodenoscope was introduced                            through the mouth, and used to inject contrast into                            and used to inject contrast into the bile duct. The  ERCP was accomplished without difficulty. The                            patient tolerated the procedure. Scope In: Scope Out: Findings:      A scout film of the abdomen was obtained. Surgical clips, consistent       with a previous cholecystectomy, were seen in the area of the right       upper quadrant of the abdomen.      A standard esophagogastroduodenoscopy scope was used for the examination       of the upper gastrointestinal tract. The scope was passed under direct       vision through the upper GI tract. Diffuse mild mucosal changes       characterized by congestion and altered texture were found in the mid       esophagus (28-30 cm). No gross lesions were noted in the entire       esophagus. The Z-line was irregular and was found 39 cm from the       incisors. Diffuse severe inflammation characterized by erosions,       friability, granularity and ulcers was found in the entire examined       stomach - biopsied  for HP evaluation. No gross lesions were noted in the       duodenal bulb and duodenal sweep. Large mucous ball at least 4-5 cm in       size was noted in the D2 region (this took 10 minutes to suction in       effort of improving visualization. A biliary sphincterotomy had been       performed. The sphincterotomy appeared open. The major papilla was       patulous with mucin. The major papilla pancreatic orifice was congested       - this was sampled to rule out dysplastic changes. The major papilla was       located entirely within a diverticulum.      A short 0.035 inch Soft Jagwire was passed into the biliary tree. The       Hydratome sphincterotome was passed over the guidewire and the bile duct       was then deeply cannulated. Contrast was injected. I personally       interpreted the bile duct images. Ductal flow of contrast was adequate.       Image quality was adequate. Contrast extended to the hepatic ducts.       Opacification of the entire biliary tree except for the gallbladder was       successful. The entire biliary tree was severely dilated. The largest       diameter was 18 mm. The entire biliary tree contained filling defects       thought to be stones and sludge. Dilation of the common bile duct       distally as a sphincteroplasty with a 09-26-11 mm x 3 cm CRE balloon (to       a maximum balloon size of 12 mm) dilator was successful for a total of 3       minutes. The biliary tree was swept with a retrieval balloon. Sludge was       swept from the duct. 3 stones were removed. Multiple stones remained.       Mechanical lithotripsy with a basket-type device was successful for       breaking further  stones. The biliary tree was swept with a retrieval       balloon. Sludge was swept from the duct. Four stones were removed.       Multiple stones remained and were beginning to cause impaction distally.       Decision made to allow biliary decompression. Two plastic biliary  stents       10 Fr by 7 cm & 10 Fr by 9 cm with a single external flap and a single       internal flap were placed into the common bile duct. The stents were in       good position.      A formal pancreatogram was not performed.      The duodenoscope was withdrawn from the patient. Impression:               - Congested, texture changed mucosa in the                            esophagus @ site of previous SCC - biopsied.                           - No other gross lesions in the entire esophagus.                           - Z-line irregular, 39 cm from the incisors.                           - Gastritis and gastric ulcers - biopsied for HP                            evaluation.                           - No gross lesions in the duodenal bulb and in the                            sweep.                           - The major papilla was located entirely within a                            diverticulum.                           - The major papilla appeared patulous with a large                            mucous ball (4-5 cm in size) that took 10 minutes                            to clear.                           - The major pancreatic orifice appeared congested -  biopsied to rule out dysplasia.                           - Prior biliary sphincterotomy appeared open.                           - Multiple filling defects consistent with stones                            and sludge was seen on the cholangiogram.                           - The entire biliary tree was severely dilated.                           - Choledocholithiasis was found. Partial removal                            was accomplished with biliary balloon                            sphincteroplasty, balloon sweeping, mechanical                            lithotripsy; 2 biliary stents placed for further                            decompression. Moderate Sedation:      Not Applicable - Patient had  care per Anesthesia. Recommendation:           - The patient will be observed post-procedure,                            until all discharge criteria are met.                           - Return patient to hospital ward for ongoing care.                           - Advance diet as tolerated.                           - Observe patient's clinical course.                           - Continue ABx for 3-days (if no issues then can                            consider Ciprofloxacin 500 mg BID) to decrease risk                            of post-interventional infectious risk.                           - Increase PPI to 40 mg twice daily.                           -  Carafate twice daily for 1 month.                           - Repeat EGD/ERCP in 3-4 months for stent removal,                            further removal of choledocholithiasis.                           - I suspect that the patient will continue to have                            issues as a result of the significant mucous                            production leading ot blockage of the biliary                            orifice and subseqently leading to biliary sludge                            and likely stone-ball development (this is my                            theory) as it does not appear that there is any                            true stenosis causing issues at this time. No                            medication will help this, and this is likely to                            recur again in the future.                           - Watch for pancreatitis, bleeding, perforation,                            and cholangitis.                           - Medicine service and daughter were made aware of                            a right knee effusion vs fluid collection that was                            noted by our endoscopy team in recovery (though per                            daughter report has had some fluid in the  region)  but with her being on blood thinners need to                            consider if there is any bleeding into this site.                           - Heparin restart @200AM  without bolus if needed.                            If DOAC to be restarted then restart on 11/15 to                            decrease risk of post-interventional bleeding.                           - The findings and recommendations were discussed                            with the patient.                           - The findings and recommendations were discussed                            with the patient's family.                           - The findings and recommendations were discussed                            with the referring physician. Procedure Code(s):        --- Professional ---                           [phone], Endoscopic retrograde                            cholangiopancreatography (ERCP); with placement of                            endoscopic stent into biliary or pancreatic duct,                            including pre- and post-dilation and guide wire                            passage, when performed, including sphincterotomy,                            when performed, each stent                           43274, 59, Endoscopic retrograde  cholangiopancreatography (ERCP); with placement of                            endoscopic stent into biliary or pancreatic duct,                            including pre- and post-dilation and guide wire                            passage, when performed, including sphincterotomy,                            when performed, each stent                           [id number], Endoscopic retrograde                            cholangiopancreatography (ERCP); with destruction                            of calculi, any method (eg, mechanical,                            electrohydraulic, lithotripsy) Diagnosis  Code(s):        --- Professional ---                           K22.89, Other specified disease of esophagus                           K29.70, Gastritis, unspecified, without bleeding                           K83.8, Other specified diseases of biliary tract                           K80.50, Calculus of bile duct without cholangitis                            or cholecystitis without obstruction                           R74.8, Abnormal levels of other serum enzymes                           Z98.890, Other specified postprocedural states                           R93.5, Abnormal findings on diagnostic imaging of                            other abdominal regions, including retroperitoneum                           R93.2, Abnormal findings on diagnostic imaging of  liver and biliary tract CPT copyright 2022 [employer] Association. All rights reserved. The codes documented in this report are preliminary and upon coder review may  be revised to meet current compliance requirements. Corliss Parish, MD 10/29/2023 3:07:19 PM Number of Addenda: 0

## 2023-10-29 NOTE — Plan of Care (Signed)
  Problem: Fluid Volume: Goal: Ability to maintain a balanced intake and output will improve Outcome: Progressing   

## 2023-10-29 NOTE — Progress Notes (Signed)
Both hearing aides and upper and lower dentures back in place on patient.

## 2023-10-29 NOTE — Telephone Encounter (Signed)
Recall has been entered as ordered  

## 2023-10-29 NOTE — Consult Note (Signed)
WOC consulted during pressure injury skin assessments for bilateral heel ulcers. Assessment completed. Patient has skin changes/discoloration of the bilateral, her heels while they are darkened it is consistent with the other discoloration of her feet. Each heel blanches easily.  I raised the foot rest on her recliner to elevate them a bit, which improves the color as well.   Re consult if needed, will not follow at this time. Thanks  Sinclaire Artiga [employer], RN,CWOCN, CNS, CWON-AP [phone])

## 2023-10-29 NOTE — Anesthesia Procedure Notes (Signed)
Procedure Name: Intubation Date/Time: 10/29/2023 12:38 PM  Performed by: Nelle Don, CRNAPre-anesthesia Checklist: Patient identified, Emergency Drugs available, Suction available and Patient being monitored Patient Re-evaluated:Patient Re-evaluated prior to induction Oxygen Delivery Method: Circle system utilized Preoxygenation: Pre-oxygenation with 100% oxygen Induction Type: IV induction Ventilation: Mask ventilation without difficulty Laryngoscope Size: Mac and 4 Grade View: Grade I Tube type: Oral Tube size: 7.0 mm Number of attempts: 1 Airway Equipment and Method: Stylet Placement Confirmation: ETT inserted through vocal cords under direct vision, positive ETCO2 and breath sounds checked- equal and bilateral Secured at: 22 cm Tube secured with: Tape Dental Injury: Teeth and Oropharynx as per pre-operative assessment

## 2023-10-29 NOTE — Anesthesia Preprocedure Evaluation (Addendum)
Anesthesia Evaluation  Patient identified by MRN, date of birth, ID band Patient awake    Reviewed: Allergy & Precautions, NPO status , Patient's Chart, lab work & pertinent test results  Airway Mallampati: II  TM Distance: >3 FB Neck ROM: Full    Dental no notable dental hx.    Pulmonary sleep apnea , former smoker   Pulmonary exam normal breath sounds clear to auscultation       Cardiovascular hypertension, Pt. on medications + Peripheral Vascular Disease  Normal cardiovascular exam Rhythm:Regular Rate:Normal     Neuro/Psych  Neuromuscular disease  negative psych ROS   GI/Hepatic ,GERD  Medicated,,  Endo/Other  diabetes, Type 2, Oral Hypoglycemic Agents    Renal/GU   negative genitourinary   Musculoskeletal   Abdominal   Peds  Hematology   Anesthesia Other Findings   Reproductive/Obstetrics                             Anesthesia Physical Anesthesia Plan  ASA: 2  Anesthesia Plan: General   Post-op Pain Management: Minimal or no pain anticipated   Induction: Intravenous  PONV Risk Score and Plan: 3 and Treatment may vary due to age or medical condition, Ondansetron and Dexamethasone  Airway Management Planned: Oral ETT  Additional Equipment: None  Intra-op Plan:   Post-operative Plan: Extubation in OR  Informed Consent: I have reviewed the patients History and Physical, chart, labs and discussed the procedure including the risks, benefits and alternatives for the proposed anesthesia with the patient or authorized representative who has indicated his/her understanding and acceptance.    Discussed DNR with patient and Suspend DNR.   Dental advisory given  Plan Discussed with: CRNA  Anesthesia Plan Comments:         Anesthesia Quick Evaluation

## 2023-10-29 NOTE — Progress Notes (Signed)
Patient returned to unit with swelling in her right knee, RN and patient's daughter assessed patient's knee, patient agreed with both RN and her daughter that her knee had grown in size since prior to her ERCP, MD aware of situation.

## 2023-10-29 NOTE — Progress Notes (Signed)
PHARMACY - ANTICOAGULATION CONSULT NOTE  Pharmacy Consult for heparin  Indication: hx pulmonary embolus (PTA xarelto on hold)  Allergies  Allergen Reactions   Gramicidin Other (See Comments)    Gramicidin is an antibiotic peptide that is used to treat local infections caused by gram-positive bacteria. Unknown reaction   Sulfa Antibiotics Rash   Wound Dressing Adhesive Rash and Other (See Comments)    Some dressings break out the skin    Patient Measurements:  Height: 5\' 4"  (162.6 cm) Weight: 68 kg (149 lb 14.6 oz) IBW/kg (Calculated) : 54.7 Heparin Dosing Weight: TBW  Vital Signs: Temp: 97.7 F (36.5 C) (11/13 1440) Temp Source: Temporal (11/13 1440) BP: 159/35 (11/13 1510) Pulse Rate: 58 (11/13 1510)  Labs: Recent Labs    10/27/23 1522 10/27/23 1950 10/27/23 1950 10/28/23 0444 10/28/23 1227 10/28/23 2149 10/29/23 0434  HGB 12.6  --   --  11.2*  --   --  10.1*  HCT 40.4  --   --  34.4*  --   --  31.0*  PLT 199  --   --  169  --   --  138*  APTT  --  38*  --  84* 67*  --   --   HEPARINUNFRC  --  0.72*   < > 0.32 0.24* 0.20*  --   CREATININE 0.85  --   --  0.90  --   --  0.91  CKTOTAL  --  60  --  50  --   --   --   TROPONINIHS 11  --   --   --   --   --   --    < > = values in this interval not displayed.    Estimated Creatinine Clearance: 36.6 mL/min (by C-G formula based on SCr of 0.91 mg/dL).   Medical History: Past Medical History:  Diagnosis Date   Arthritis    Choledocholithiasis    Diabetes mellitus without complication (HCC)    Diverticulitis    Esophageal cancer (HCC) dx'd 2020   GERD (gastroesophageal reflux disease)    Hyperlipidemia    Hypertension    OSA (obstructive sleep apnea)    Urine incontinence    Uterine cancer (HCC)     Medications:  - Xarelto 20 mg daily PTA (last dose taken on 10/26/23 at 7p)   Assessment: Patient is a 89 y.o F with hx PE on xarelto PTA who presented to the ED on 10/27/23 with c/o abdominal pain. Pharmacy  has been consulted to transition anticoag. to heparin drip on admission.  Today, 10/29/2023: Heparin stopped this AM at at [facility] for ERCP. Per OP note, it is safe to resume IV heparin tonight at 0200 with no bolus. And safe to resume DOAC on 11/15.    - Goal of Therapy:  Heparin level 0.3-0.7 units/ml aPTT 66-102 seconds Monitor platelets by anticoagulation protocol: Yes   Plan:  Restart IV heparin 11/14 at 0200 at rate of 1200 units/hr Recheck heparin level 8 hours after heparin restarted Daily CBC   Hessie Knows, PharmD, BCPS Secure Chat if ?s 10/29/2023 3:38 PM

## 2023-10-29 NOTE — Telephone Encounter (Signed)
-----   Message from [facility] sent at 10/29/2023  3:46 PM EST ----- Regarding: Repeat ERCP Sherry Holloway, Repeat ERCP recall in 3-4 months for stent removal and biliary clean out. Thanks. GM

## 2023-10-29 NOTE — Interval H&P Note (Signed)
History and Physical Interval Note:  10/29/2023 12:07 PM  Sherry Holloway  has presented today for surgery, with the diagnosis of biliary obstruction, choledocholithiasis.  The various methods of treatment have been discussed with the patient and family. After consideration of risks, benefits and other options for treatment, the patient has consented to  Procedure(s): ENDOSCOPIC RETROGRADE CHOLANGIOPANCREATOGRAPHY (ERCP) (N/A) UPPER ENDOSCOPIC ULTRASOUND (EUS) LINEAR (N/A) ESOPHAGOGASTRODUODENOSCOPY (EGD) (N/A) as a surgical intervention.  The patient's history has been reviewed, patient examined, no change in status, stable for surgery.  I have reviewed the patient's chart and labs.  Questions were answered to the patient's satisfaction.    The risks of an EUS including intestinal perforation, bleeding, infection, aspiration, and medication effects were discussed as was the possibility it may not give a definitive diagnosis if a biopsy is performed.  When a biopsy of the pancreas is done as part of the EUS, there is an additional risk of pancreatitis at the rate of about 1-2%.  It was explained that procedure related pancreatitis is typically mild, although it can be severe and even life threatening, which is why we do not perform random pancreatic biopsies and only biopsy a lesion/area we feel is concerning enough to warrant the risk.  The risks of an ERCP were discussed at length, including but not limited to the risk of perforation, bleeding, abdominal pain, post-ERCP pancreatitis (while usually mild can be severe and even life threatening).    [employer]

## 2023-10-29 NOTE — Progress Notes (Signed)
[facility] Progress Note Patient: Sherry Holloway BJY:[id number] DOB: [birth year] DOA: 10/27/2023  DOS: the patient was seen and examined on 10/29/2023  Brief hospital course: PMH of esophageal cancer, cholecystectomy with recurrent choledocholithiasis, type II DM, PE on Xarelto, HTN, chronic pancreatic insufficiency. Present to the hospital because of abdominal pain. Found to have choledocholithiasis again. Underwent ERCP with stent placement.  Assessment and Plan: Acute biliary colic with transaminitis secondary to choledocholithiasis Underwent ERCP Sphincter of plasty and stent placed. Monitor for pain control Diet will be advanced as tolerated Continue with IV hydration. Resume anticoagulation at 2 AM.  Right knee swelling. Per family appears to be chronic. Will monitor for now.  Diabetes mellitus, uncontrolled with hyperglycemia without long-term insulin use. On Jardiance glimepiride on hold. Hemoglobin A1c 7.2. Continue sliding scale insulin.  Chronic pain. On MS Contin. Will monitor for now.  History of PE. On Xarelto at home. Will resume anticoagulation with heparin for now.  HTN. Continue blood pressure medication for now.  Chronic diastolic CHF. Volume status adequate. On Bumex. On hold.   Subjective: No nausea no vomiting or no fever no chills were seen after the procedure therefore sleepy and drowsy.  Physical Exam: General: in Mild distress, No Rash Cardiovascular: S1 and S2 Present, No Murmur Respiratory: Good respiratory effort, Bilateral Air entry present. No Crackles, No wheezes Abdomen: Bowel Sound present, No tenderness Extremities: No edema, right knee swelling seen. Neuro: Drowsy.  Occasionally able to follow commands.  Data Reviewed: I have Reviewed nursing notes, Vitals, and Lab results. Since last encounter, pertinent lab results CBC and BMP   . I have ordered test including CBC and BMP  . I have discussed pt's care plan and test  results with GI  .   Disposition: Status is: Inpatient Remains inpatient appropriate because: Monitor for improvement in overall tolerance  Family Communication: Discussed with daughter at bedside Level of care: Med-Surg   Vitals:   10/29/23 1452 10/29/23 1500 10/29/23 1510 10/29/23 1806  BP: (!) 172/58 (!) 164/43 (!) 159/35 (!) 126/42  Pulse: 61 (!) 59 (!) 58 (!) 49  Resp: 18 11 12 18   Temp:    (!) 97.5 F (36.4 C)  TempSrc:    Oral  SpO2: 98% 93% 91% 95%  Weight:      Height:         Author: Lynden Oxford, MD 10/29/2023 7:35 PM  Please look on [email or web address] to find out who is on call.

## 2023-10-29 NOTE — Hospital Course (Signed)
Brief hospital course: PMH of esophageal cancer, cholecystectomy with recurrent choledocholithiasis, type II DM, PE on Xarelto, HTN, chronic pancreatic insufficiency. Present to the hospital because of abdominal pain. Found to have choledocholithiasis again. Underwent ERCP with stent placement.  Assessment and Plan: Acute biliary colic with transaminitis secondary to choledocholithiasis Underwent ERCP Sphincter of plasty and stent placed. Monitor for pain control Diet will be advanced as tolerated Continue with IV hydration. Resume anticoagulation at 2 AM. GI currently signed off.  Right knee swelling. Per family appears to be chronic.  Currently stable in the hospital. Will monitor for now.  Diabetes mellitus, uncontrolled with hyperglycemia without long-term insulin use. On Jardiance glimepiride on hold. Hemoglobin A1c 7.2. Continue sliding scale insulin.  Chronic pain. On MS Contin. Will monitor for now.  History of PE. On Xarelto at home. Will resume anticoagulation with heparin for now.  Resume Xarelto on 11/15.  HTN. Continue blood pressure medication for now.  Chronic diastolic CHF. Volume status adequate. On Bumex. On hold.  Hyponatremia. Hypokalemia. Likely partially lab error. There is blood sugar of 471 most likely suggest that the patient's labs were not drawn properly given that repeat CBG showed 96 of blood glucose. Repeat labs showed sodium of 133 which is appropriate still on the lower side. Potassium 3.3.  Replaced. Monitor.

## 2023-10-29 NOTE — Progress Notes (Signed)
Post ERCP/EGD and tangerine sized soft raised area upon and swollen larger area over patella area noted to left knee. Dr. Meridee Score examined area. Daughter was notified and reported that her mother has had a tangerine sized area to her knee. Daughter expected to follow up in the near future to determine if this area has increased in size. Norman Clay, RN did trace both areas with a skin marker.

## 2023-10-30 ENCOUNTER — Encounter (HOSPITAL_COMMUNITY): Payer: Self-pay | Admitting: Gastroenterology

## 2023-10-30 DIAGNOSIS — D490 Neoplasm of unspecified behavior of digestive system: Secondary | ICD-10-CM | POA: Diagnosis not present

## 2023-10-30 DIAGNOSIS — K8051 Calculus of bile duct without cholangitis or cholecystitis with obstruction: Secondary | ICD-10-CM | POA: Diagnosis not present

## 2023-10-30 DIAGNOSIS — K8050 Calculus of bile duct without cholangitis or cholecystitis without obstruction: Secondary | ICD-10-CM | POA: Diagnosis not present

## 2023-10-30 DIAGNOSIS — K2970 Gastritis, unspecified, without bleeding: Secondary | ICD-10-CM | POA: Diagnosis not present

## 2023-10-30 DIAGNOSIS — K831 Obstruction of bile duct: Secondary | ICD-10-CM

## 2023-10-30 LAB — BASIC METABOLIC PANEL
Anion gap: 8 (ref 5–15)
BUN: 18 mg/dL (ref 8–23)
CO2: 21 mmol/L — ABNORMAL LOW (ref 22–32)
Calcium: 9 mg/dL (ref 8.9–10.3)
Chloride: 104 mmol/L (ref 98–111)
Creatinine, Ser: 0.88 mg/dL (ref 0.44–1.00)
GFR, Estimated: >60 mL/min (ref >60)
Glucose, Bld: 141 mg/dL — ABNORMAL HIGH (ref 70–99)
Potassium: 3.3 mmol/L — ABNORMAL LOW (ref 3.5–5.1)
Sodium: 133 mmol/L — ABNORMAL LOW (ref 135–145)

## 2023-10-30 LAB — COMPREHENSIVE METABOLIC PANEL
ALT: 199 U/L — ABNORMAL HIGH (ref 0–44)
AST: 30 U/L (ref 15–41)
Albumin: 2.2 g/dL — ABNORMAL LOW (ref 3.5–5.0)
Alkaline Phosphatase: 176 U/L — ABNORMAL HIGH (ref 38–126)
Anion gap: 5 (ref 5–15)
BUN: 19 mg/dL (ref 8–23)
CO2: 22 mmol/L (ref 22–32)
Calcium: 7.7 mg/dL — ABNORMAL LOW (ref 8.9–10.3)
Chloride: 101 mmol/L (ref 98–111)
Creatinine, Ser: 1.1 mg/dL — ABNORMAL HIGH (ref 0.44–1.00)
GFR, Estimated: 47 mL/min — ABNORMAL LOW (ref >60)
Glucose, Bld: 471 mg/dL — ABNORMAL HIGH (ref 70–99)
Potassium: 2.9 mmol/L — ABNORMAL LOW (ref 3.5–5.1)
Sodium: 128 mmol/L — ABNORMAL LOW (ref 135–145)
Total Bilirubin: 0.7 mg/dL (ref <1.2)
Total Protein: 4.7 g/dL — ABNORMAL LOW (ref 6.5–8.1)

## 2023-10-30 LAB — GLUCOSE, CAPILLARY
Glucose-Capillary: 212 mg/dL — ABNORMAL HIGH (ref 70–99)
Glucose-Capillary: 96 mg/dL (ref 70–99)
Glucose-Capillary: 203 mg/dL — ABNORMAL HIGH (ref 70–99)
Glucose-Capillary: 179 mg/dL — ABNORMAL HIGH (ref 70–99)
Glucose-Capillary: 246 mg/dL — ABNORMAL HIGH (ref 70–99)

## 2023-10-30 LAB — MAGNESIUM: Magnesium: 2 mg/dL (ref 1.7–2.4)

## 2023-10-30 LAB — CBC
HCT: 28.8 % — ABNORMAL LOW (ref 36.0–46.0)
Hemoglobin: 8.9 g/dL — ABNORMAL LOW (ref 12.0–15.0)
MCH: 28 pg (ref 26.0–34.0)
MCHC: 30.9 g/dL (ref 30.0–36.0)
MCV: 90.6 fL (ref 80.0–100.0)
Platelets: 121 10*3/uL — ABNORMAL LOW (ref 150–400)
RBC: 3.18 MIL/uL — ABNORMAL LOW (ref 3.87–5.11)
RDW: 14.8 % (ref 11.5–15.5)
WBC: 4.9 10*3/uL (ref 4.0–10.5)
nRBC: 0 % (ref 0.0–0.2)

## 2023-10-30 LAB — OSMOLALITY: Osmolality: 287 mosm/kg (ref 275–295)

## 2023-10-30 LAB — HEPARIN LEVEL (UNFRACTIONATED)
Heparin Unfractionated: 0.2 [IU]/mL — ABNORMAL LOW (ref 0.30–0.70)
Heparin Unfractionated: 0.35 [IU]/mL (ref 0.30–0.70)

## 2023-10-30 LAB — CANCER ANTIGEN 19-9: CA 19-9: 37 U/mL — ABNORMAL HIGH (ref 0–35)

## 2023-10-30 MED ORDER — HYDRALAZINE HCL 20 MG/ML IJ SOLN: 10.0000 mg | INTRAMUSCULAR | Status: DC | PRN

## 2023-10-30 MED ORDER — ENSURE ENLIVE PO LIQD
237.0000 mL | Freq: Three times a day (TID) | ORAL | Status: DC
  Administered 2023-10-30 – 2023-10-31 (×3): 237 mL via ORAL

## 2023-10-30 MED ORDER — POTASSIUM CHLORIDE CRYS ER 20 MEQ PO TBCR: 40.0000 meq | EXTENDED_RELEASE_TABLET | ORAL | Status: DC

## 2023-10-30 NOTE — Progress Notes (Signed)
PHARMACY - ANTICOAGULATION CONSULT NOTE  Pharmacy Consult for Heparin Indication: pulmonary embolus  Allergies  Allergen Reactions   Gramicidin Other (See Comments)    Gramicidin is an antibiotic peptide that is used to treat local infections caused by gram-positive bacteria. Unknown reaction   Sulfa Antibiotics Rash   Wound Dressing Adhesive Rash and Other (See Comments)    Some dressings break out the skin    Patient Measurements: Height: 5\' 4"  (162.6 cm) Weight: 68 kg (149 lb 14.6 oz) IBW/kg (Calculated) : 54.7  Vital Signs: Temp: 98.2 F (36.8 C) (11/14 2050) Temp Source: Oral (11/14 2050) BP: 98/58 (11/14 2050) Pulse Rate: 53 (11/14 2050)  Labs: Recent Labs    10/28/23 0444 10/28/23 1227 10/28/23 2149 10/29/23 0434 10/30/23 0433 10/30/23 0950 10/30/23 2023  HGB 11.2*  --   --  10.1* 8.9*  --   --   HCT 34.4*  --   --  31.0* 28.8*  --   --   PLT 169  --   --  138* 121*  --   --   APTT 84* 67*  --   --   --   --   --   HEPARINUNFRC 0.32 0.24* 0.20*  --   --  0.20* 0.35  CREATININE 0.90  --   --  0.91 1.10* 0.88  --   CKTOTAL 50  --   --   --   --   --   --     Estimated Creatinine Clearance: 37.8 mL/min (by C-G formula based on SCr of 0.88 mg/dL).   Medical History: Past Medical History:  Diagnosis Date   Arthritis    Choledocholithiasis    Diabetes mellitus without complication (HCC)    Diverticulitis    Esophageal cancer (HCC) dx'd 2020   GERD (gastroesophageal reflux disease)    Hyperlipidemia    Hypertension    OSA (obstructive sleep apnea)    Urine incontinence    Uterine cancer (HCC)     Assessment: AC/Heme: hx PE on xarelto 20 mg daily PTA (last dose taken on 10/26/23 at 7p) --> heparin drip on 11/11 - 11/13: Hep off for ERCP - 11/14: Resume IV Heparin (0200). Heparin level 0.35 now back in goal range.. Plan DOAC resumption tomorrow.  Goal of Therapy:  Heparin level 0.3-0.7 units/ml Monitor platelets by anticoagulation protocol: Yes    Plan:  Con't IV heparin at 1350 units/hr Daily HL and CBC   Madiha Bambrick S. Merilynn Finland, PharmD, BCPS Clinical Staff Pharmacist [email or web address] Merilynn Finland, Tiarra Anastacio Stillinger 10/30/2023,9:00 PM

## 2023-10-30 NOTE — Plan of Care (Signed)
  Problem: Education: Goal: Ability to describe self-care measures that may prevent or decrease complications (Diabetes Survival Skills Education) will improve Outcome: Progressing   Problem: Nutritional: Goal: Maintenance of adequate nutrition will improve Outcome: Progressing   

## 2023-10-30 NOTE — Progress Notes (Signed)
PHARMACY - ANTICOAGULATION CONSULT NOTE  Pharmacy Consult for heparin  Indication: hx pulmonary embolus (PTA xarelto on hold)  Allergies  Allergen Reactions   Gramicidin Other (See Comments)    Gramicidin is an antibiotic peptide that is used to treat local infections caused by gram-positive bacteria. Unknown reaction   Sulfa Antibiotics Rash   Wound Dressing Adhesive Rash and Other (See Comments)    Some dressings break out the skin    Patient Measurements:  Height: 5\' 4"  (162.6 cm) Weight: 68 kg (149 lb 14.6 oz) IBW/kg (Calculated) : 54.7 Heparin Dosing Weight: TBW  Vital Signs: Temp: 97.9 F (36.6 C) (11/14 0605) BP: 146/50 (11/14 0605) Pulse Rate: 53 (11/14 0605)  Labs: Recent Labs    10/27/23 1522 10/27/23 1522 10/27/23 1950 10/28/23 0444 10/28/23 1227 10/28/23 2149 10/29/23 0434 10/30/23 0433 10/30/23 0950  HGB 12.6  --   --  11.2*  --   --  10.1* 8.9*  --   HCT 40.4  --   --  34.4*  --   --  31.0* 28.8*  --   PLT 199  --   --  169  --   --  138* 121*  --   APTT  --   --  38* 84* 67*  --   --   --   --   HEPARINUNFRC  --    < > 0.72* 0.32 0.24* 0.20*  --   --  0.20*  CREATININE 0.85  --   --  0.90  --   --  0.91 1.10*  --   CKTOTAL  --   --  60 50  --   --   --   --   --   TROPONINIHS 11  --   --   --   --   --   --   --   --    < > = values in this interval not displayed.    Estimated Creatinine Clearance: 30.3 mL/min (A) (by C-G formula based on SCr of 1.1 mg/dL (H)).   Medical History: Past Medical History:  Diagnosis Date   Arthritis    Choledocholithiasis    Diabetes mellitus without complication (HCC)    Diverticulitis    Esophageal cancer (HCC) dx'd 2020   GERD (gastroesophageal reflux disease)    Hyperlipidemia    Hypertension    OSA (obstructive sleep apnea)    Urine incontinence    Uterine cancer (HCC)     Medications:  - Xarelto 20 mg daily PTA (last dose taken on 10/26/23 at 7p)   Assessment: Patient is a 89 y.o F with hx PE on  xarelto PTA who presented to the ED on 10/27/23 with c/o abdominal pain. Pharmacy has been consulted to transition anticoag. to heparin drip on admission.  Today, 10/30/2023: HL is 0.20, subtherapeutic Hgb 8.9 decrease Plt 121, low  Scr 1.10 mg/dl, bumped up  No line or bleeding issues with heparin drip    - Goal of Therapy:  Heparin level 0.3-0.7 units/ml aPTT 66-102 seconds Monitor platelets by anticoagulation protocol: Yes   Plan:  Increase heparin drip to 1350 units/hr  Recheck heparin level 8 hours after heparin rate change  Daily CBC, Daily HL  F/u ability to resume DOAC on 11/15     Adalberto Cole, PharmD, BCPS 10/30/2023 11:23 AM

## 2023-10-30 NOTE — Progress Notes (Signed)
Mobility Specialist - Progress Note   10/30/23 1031  Mobility  Activity Ambulated with assistance in hallway;Ambulated with assistance to bathroom  Level of Assistance Contact guard assist, steadying assist  Assistive Device Front wheel walker  Distance Ambulated (ft) 160 ft  Activity Response Tolerated well  Mobility Referral Yes  $Mobility charge 1 Mobility  Mobility Specialist Start Time (ACUTE ONLY) 1006  Mobility Specialist Stop Time (ACUTE ONLY) 1025  Mobility Specialist Time Calculation (min) (ACUTE ONLY) 19 min   Pt received in bed and agreeable to mobility. Pt was modA from STS & CG during ambulation. Prior to ambulating, pt requested assistance to bathroom. No complaints during session. Pt to recliner after session with all needs met.   [facility]

## 2023-10-30 NOTE — Progress Notes (Signed)
[facility] Progress Note Patient: Sherry Holloway WUJ:[id number] DOB: May 27, 1928 DOA: 10/27/2023  DOS: the patient was seen and examined on 10/30/2023  Brief hospital course: PMH of esophageal cancer, cholecystectomy with recurrent choledocholithiasis, type II DM, PE on Xarelto, HTN, chronic pancreatic insufficiency. Present to the hospital because of abdominal pain. Found to have choledocholithiasis again. Underwent ERCP with stent placement.  Assessment and Plan: Acute biliary colic with transaminitis secondary to choledocholithiasis Underwent ERCP Sphincter of plasty and stent placed. Monitor for pain control Diet will be advanced as tolerated Continue with IV hydration. Resume anticoagulation at 2 AM. GI currently signed off.  Right knee swelling. Per family appears to be chronic.  Currently stable in the hospital. Will monitor for now.  Diabetes mellitus, uncontrolled with hyperglycemia without long-term insulin use. On Jardiance glimepiride on hold. Hemoglobin A1c 7.2. Continue sliding scale insulin.  Chronic pain. On MS Contin. Will monitor for now.  History of PE. On Xarelto at home. Will resume anticoagulation with heparin for now.  Resume Xarelto on 11/15.  HTN. Continue blood pressure medication for now.  Chronic diastolic CHF. Volume status adequate. On Bumex. On hold.  Hyponatremia. Hypokalemia. Likely partially lab error. There is blood sugar of 471 most likely suggest that the patient's labs were not drawn properly given that repeat CBG showed 96 of blood glucose. Repeat labs showed sodium of 133 which is appropriate still on the lower side. Potassium 3.3.  Replaced. Monitor.   Subjective: No nausea no vomiting no fever no chills.  Ambulating in the hallway.  Abdominal pain well-controlled.  Oral intake adequate.  Physical Exam: General: in Mild distress, No Rash Cardiovascular: S1 and S2 Present, No Murmur Respiratory: Good respiratory  effort, Bilateral Air entry present. No Crackles, No wheezes Abdomen: Bowel Sound present, No tenderness Extremities: No edema, knee swelling appears to be stable. Neuro: Alert and oriented x3, no new focal deficit  Data Reviewed: I have Reviewed nursing notes, Vitals, and Lab results. Since last encounter, pertinent lab results CBC and BMP   . I have ordered test including CBC and BMP  .  Disposition: Status is: Inpatient Remains inpatient appropriate because: Monitor for stabilization while on anticoagulation   Family Communication: No one at bedside discussed with daughter on 11/13. Level of care: Med-Surg   Vitals:   10/29/23 1806 10/29/23 2039 10/30/23 0605 10/30/23 1342  BP: (!) 126/42 (!) 138/43 (!) 146/50 (!) 125/36  Pulse: (!) 49  (!) 53 (!) 48  Resp: 18 18 18 18   Temp: (!) 97.5 F (36.4 C) 97.9 F (36.6 C) 97.9 F (36.6 C) 98.2 F (36.8 C)  TempSrc: Oral   Oral  SpO2: 95% 97% 97% 100%  Weight:      Height:         Author: Lynden Oxford, MD 10/30/2023 6:03 PM  Please look on [email or web address] to find out who is on call.

## 2023-10-30 NOTE — Progress Notes (Signed)
Progress Note   Subjective  Chief Complaint: Acute abdominal pain, elevated LFTs, history of choledocholithiasis  10/29/23 ERCP with congested, texture change mucosa in the esophagus at site of previous SCC was biopsied, Z-line irregular, gastritis and gastric ulcers biopsy for HP, major papula appeared patulous with a large mucus ball, major pancreatic orifice appeared congested, prior biliary sphincterotomy appeared open, multiple filling defects consistent with stones and sludge, entire biliary tree severely dilated, choledocholithiasis found and partial removal accomplished, 2 biliary stents placed for further decompression.  Today, patient eating her regular diet and feeling well.  No abdominal pain, no nausea, vomiting.  Has a few questions about her procedure.   Objective   Vital signs in last 24 hours: Temp:  [97.5 F (36.4 C)-97.9 F (36.6 C)] 97.9 F (36.6 C) (11/14 0605) Pulse Rate:  [49-73] 53 (11/14 0605) Resp:  [11-18] 18 (11/14 0605) BP: (119-172)/(29-73) 146/50 (11/14 0605) SpO2:  [91 %-100 %] 97 % (11/14 0605) Weight:  [68 kg] 68 kg (11/13 1139) Last BM Date : 10/27/23 General:    white female in NAD Heart:  Regular rate and rhythm; no murmurs Lungs: Respirations even and unlabored, lungs CTA bilaterally Abdomen:  Soft, nontender and nondistended. Normal bowel sounds. Psych:  Cooperative. Normal mood and affect.  Intake/Output from previous day: 11/13 0701 - 11/14 0700 In: 1435.7 [P.O.:350; I.V.:1085.7] Out: 1550 [Urine:1550] Intake/Output this shift: Total I/O In: 360 [P.O.:360] Out: 0   Lab Results: Recent Labs    10/28/23 0444 10/29/23 0434 10/30/23 0433  WBC 9.8 6.5 4.9  HGB 11.2* 10.1* 8.9*  HCT 34.4* 31.0* 28.8*  PLT 169 138* 121*   BMET Recent Labs    10/28/23 0444 10/29/23 0434 10/30/23 0433  NA 136 130* 128*  K 3.9 3.8 2.9*  CL 108 103 101  CO2 21* 21* 22  GLUCOSE 100* 85 471*  BUN 29* 18 19  CREATININE 0.90 0.91 1.10*   CALCIUM 8.8* 8.3* 7.7*      Latest Ref Rng & Units 10/30/2023    4:33 AM 10/29/2023    4:34 AM 10/28/2023    4:44 AM  Hepatic Function  Total Protein 6.5 - 8.1 g/dL 4.7  5.1  5.7   Albumin 3.5 - 5.0 g/dL 2.2  2.5  2.9   AST 15 - 41 U/L 30  143  683   ALT 0 - 44 U/L 199  356  650   Alk Phosphatase 38 - 126 U/L 176  232  310   Total Bilirubin <1.2 mg/dL 0.7  1.5  2.2     Studies/Results: DG ERCP  Result Date: 10/30/2023 CLINICAL DATA:  89-year-old female with history of choledocholithiasis. EXAM: ERCP TECHNIQUE: Multiple spot images obtained with the fluoroscopic device and submitted for interpretation post-procedure. FLUOROSCOPY TIME:  Fluoroscopy Time:  4 minutes, 33 seconds Radiation Exposure Index (if provided by the fluoroscopic device): 54.96 mGy Number of Acquired Spot Images: 17 COMPARISON:  10/27/2023, 10/28/2023 FINDINGS: Endoscope is positioned in the duodenum. There is retrograde cannulation of the common bile duct. Balloon dilation of the region of the ampulla. Retrograde cholangiogram demonstrates multiple rounded filling defects throughout the common bile duct which appears moderately dilated. There is suggestion of moderate central intrahepatic biliary ductal dilation. Two plastic common bile duct stents were placed. IMPRESSION: ERCP as above. These images were submitted for radiologic interpretation only. Please see the procedural report for the full procedural details, amount of contrast, and the fluoroscopy time utilized. Marliss Coots, MD [facility] Electronically Signed   By: Marliss Coots M.D.   On: 10/30/2023 07:49   DG C-Arm 1-60 Min-No Report  Result Date: 10/29/2023 Fluoroscopy was utilized by the requesting physician.  No radiographic interpretation.   CT PANCREAS ABDOMEN W WO CONTRAST  Result Date: 10/28/2023 CLINICAL DATA:  Abdominal pain, jaundice, IPMN suspected * Tracking Code: BO * EXAM: CT ABDOMEN  WITHOUT AND WITH CONTRAST TECHNIQUE: Multidetector CT imaging of the abdomen was performed following the standard protocol before and following the bolus administration of intravenous contrast. RADIATION DOSE REDUCTION: This exam was performed according to the departmental dose-optimization program which includes automated exposure control, adjustment of the mA and/or kV according to patient size and/or use of iterative reconstruction technique. CONTRAST:  80mL OMNIPAQUE IOHEXOL 350 MG/ML SOLN COMPARISON:  Abdominal ultrasound, 10/27/2023, CT abdomen pelvis, 11/25/2021 FINDINGS: Lower chest: No acute abnormality. Coronary artery calcifications. Bibasilar scarring or atelectasis. Hepatobiliary: No focal liver abnormality is seen. Status post cholecystectomy. Severe intra and extrahepatic biliary ductal dilatation. The common bile duct, common hepatic duct, and remnant cystic duct are packed with faintly calcified gallstones, measuring up to 1.5 cm (series 13, image 36). Pancreas: Significant interval enlargement of a cystic lesion within the pancreatic head extending to the ampulla, measuring 4.1 x 3.3 cm (series 7, image 55). Severe pancreatic atrophy with diffuse dilatation of the pancreatic duct measuring up to 1.2 cm in caliber (series 7, image 39). Spleen: Normal in size without significant abnormality. Adrenals/Urinary Tract: Adrenal glands are unremarkable. Kidneys are normal, without renal calculi, solid lesion, or hydronephrosis. Stomach/Bowel: Stomach is within normal limits. No evidence of bowel wall thickening, distention, or inflammatory changes. Large burden of stool throughout the included colon. Vascular/Lymphatic: Aortic atherosclerosis. No enlarged abdominal or pelvic lymph nodes. Other: No abdominal wall hernia or abnormality. No ascites. Musculoskeletal: No acute or significant osseous findings. IMPRESSION: 1. Status post cholecystectomy. Severe intra and extrahepatic biliary ductal dilatation.  The common bile duct, common hepatic duct, and remnant cystic duct are packed with faintly calcified gallstones, measuring up to 1.5 cm. 2. Significant interval enlargement of a cystic lesion within the pancreatic head extending to the ampulla, measuring 4.1 x 3.3 cm. Severe pancreatic atrophy with diffuse dilatation of the pancreatic duct measuring up to 1.2 cm in caliber. 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 Consider EUS/FNA if not already performed and if clinically appropriate given advanced patient age. 3. Large burden of stool. 4. Coronary artery disease. Aortic Atherosclerosis (ICD10-I70.0). Electronically Signed   By: Jearld Lesch M.D.   On: 10/28/2023 15:31      Assessment / Plan:    Assessment: 1.  Elevated LFTs and recurrent choledocholithiasis: Status post ERCP 10/29/2023 with partial removal of choledocholithiasis and 2 stents placed, LFTs trending down patient doing well this morning with no abdominal pain 2.  Status post remote cholecystectomy 3.  Esophageal lesion noted EGD/EUS 2022-rebiopsied yesterday, biopsies pending 4.  History of prior pulmonary embolism-on chronic Xarelto 5.  Diabetes 6.  Chronic pain syndrome on chronic narcotics  Plan: 1.  Continue antibiotics for 3 days (if no issues and can consider Ciprofloxacin 500 mg twice daily) to decrease risk of post interventional infection 2.  Increase PPI to 40 mg twice daily 3.  Carafate twice daily for a month 4.  Repeat EGD/ERCP in 3 to 4 months for stent removal, discussed this with the patient today.  Arrangements will be made  by our office.  We will sign off. Please let us know if we may be of any further assistance.   LOS: 2 days   Unk Lightning  10/30/2023, 10:22 AM

## 2023-10-30 NOTE — Plan of Care (Signed)
  Problem: Activity: Goal: Risk for activity intolerance will decrease Outcome: Progressing   

## 2023-10-31 DIAGNOSIS — K8050 Calculus of bile duct without cholangitis or cholecystitis without obstruction: Secondary | ICD-10-CM | POA: Diagnosis not present

## 2023-10-31 LAB — CBC
HCT: 32.9 % — ABNORMAL LOW (ref 36.0–46.0)
Hemoglobin: 10.2 g/dL — ABNORMAL LOW (ref 12.0–15.0)
MCH: 27.7 pg (ref 26.0–34.0)
MCHC: 31 g/dL (ref 30.0–36.0)
MCV: 89.4 fL (ref 80.0–100.0)
Platelets: 138 10*3/uL — ABNORMAL LOW (ref 150–400)
RBC: 3.68 MIL/uL — ABNORMAL LOW (ref 3.87–5.11)
RDW: 14.7 % (ref 11.5–15.5)
WBC: 4 10*3/uL (ref 4.0–10.5)
nRBC: 0 % (ref 0.0–0.2)

## 2023-10-31 LAB — COMPREHENSIVE METABOLIC PANEL
ALT: 149 U/L — ABNORMAL HIGH (ref 0–44)
AST: 20 U/L (ref 15–41)
Albumin: 2.4 g/dL — ABNORMAL LOW (ref 3.5–5.0)
Alkaline Phosphatase: 192 U/L — ABNORMAL HIGH (ref 38–126)
Anion gap: 5 (ref 5–15)
BUN: 18 mg/dL (ref 8–23)
CO2: 22 mmol/L (ref 22–32)
Calcium: 8.6 mg/dL — ABNORMAL LOW (ref 8.9–10.3)
Chloride: 107 mmol/L (ref 98–111)
Creatinine, Ser: 0.62 mg/dL (ref 0.44–1.00)
GFR, Estimated: >60 mL/min (ref >60)
Glucose, Bld: 184 mg/dL — ABNORMAL HIGH (ref 70–99)
Potassium: 3.7 mmol/L (ref 3.5–5.1)
Sodium: 134 mmol/L — ABNORMAL LOW (ref 135–145)
Total Bilirubin: 0.6 mg/dL (ref <1.2)
Total Protein: 5.3 g/dL — ABNORMAL LOW (ref 6.5–8.1)

## 2023-10-31 LAB — GLUCOSE, CAPILLARY
Glucose-Capillary: 140 mg/dL — ABNORMAL HIGH (ref 70–99)
Glucose-Capillary: 164 mg/dL — ABNORMAL HIGH (ref 70–99)

## 2023-10-31 LAB — HEPARIN LEVEL (UNFRACTIONATED): Heparin Unfractionated: 0.28 [IU]/mL — ABNORMAL LOW (ref 0.30–0.70)

## 2023-10-31 LAB — MAGNESIUM: Magnesium: 2.2 mg/dL (ref 1.7–2.4)

## 2023-10-31 MED ORDER — BUMETANIDE 1 MG PO TABS: 0.5000 mg | ORAL_TABLET | Freq: Every day | ORAL | Status: DC | PRN

## 2023-10-31 MED ORDER — HEPARIN (PORCINE) 25000 UT/250ML-% IV SOLN: 1400.0000 [IU]/h | INTRAVENOUS | Status: AC

## 2023-10-31 MED ORDER — CIPROFLOXACIN HCL 500 MG PO TABS: 500.0000 mg | ORAL_TABLET | Freq: Two times a day (BID) | ORAL | 0 refills | Status: AC

## 2023-10-31 MED ORDER — RIVAROXABAN 10 MG PO TABS
20.0000 mg | ORAL_TABLET | Freq: Every day | ORAL | Status: DC
Start: 2023-10-31 — End: 2023-10-31
  Administered 2023-10-31: 20 mg via ORAL
  Filled 2023-10-31: qty 2

## 2023-10-31 NOTE — Progress Notes (Signed)
   10/31/23 1426  TOC Brief Assessment  Insurance and Status Reviewed  Patient has primary care physician Yes  Home environment has been reviewed to dc to daughter's home  Prior level of function: independent/ supervision  Prior/Current Home Services No current home services  Social Determinants of Health Reivew SDOH reviewed no interventions necessary  Readmission risk has been reviewed Yes  Transition of care needs no transition of care needs at this time

## 2023-10-31 NOTE — Plan of Care (Signed)

## 2023-10-31 NOTE — Progress Notes (Signed)
Mobility Specialist - Progress Note   10/31/23 0948  Mobility  Activity Ambulated with assistance in hallway;Ambulated with assistance to bathroom  Level of Assistance Contact guard assist, steadying assist  Assistive Device Front wheel walker  Distance Ambulated (ft) 160 ft  Activity Response Tolerated well  Mobility Referral Yes  $Mobility charge 1 Mobility  Mobility Specialist Start Time (ACUTE ONLY) [id number]  Mobility Specialist Stop Time (ACUTE ONLY) 0848  Mobility Specialist Time Calculation (min) (ACUTE ONLY) 20 min   Pt received in bed and agreeable to mobility. Prior to ambulating, pt requested assistance to the bathroom. Pt was minA from STS & CG during ambulation. No complaints during session. Pt to recliner after session with all needs met.    [facility]

## 2023-10-31 NOTE — Progress Notes (Signed)
Pt was discharged home today. Instructions were reviewed with patient, and questions were answered. Pt was taken to main entrance via wheelchair by NT.  

## 2023-10-31 NOTE — Progress Notes (Signed)
PHARMACY - ANTICOAGULATION CONSULT NOTE  Pharmacy Consult for Rivaroxaban Indication: pulmonary embolus  Allergies  Allergen Reactions   Gramicidin Other (See Comments)    Gramicidin is an antibiotic peptide that is used to treat local infections caused by gram-positive bacteria. Unknown reaction   Sulfa Antibiotics Rash   Wound Dressing Adhesive Rash and Other (See Comments)    Some dressings break out the skin    Patient Measurements: Height: 5\' 4"  (162.6 cm) Weight: 68 kg (149 lb 14.6 oz) IBW/kg (Calculated) : 54.7  Vital Signs: Temp: 97.8 F (36.6 C) (11/15 0513) Temp Source: Oral (11/15 0513) BP: 97/57 (11/15 0513) Pulse Rate: 61 (11/15 0513)  Labs: Recent Labs    10/28/23 1227 10/28/23 2149 10/29/23 0434 10/30/23 0433 10/30/23 0950 10/30/23 2023 10/31/23 0435  HGB  --    < > 10.1* 8.9*  --   --  10.2*  HCT  --   --  31.0* 28.8*  --   --  32.9*  PLT  --   --  138* 121*  --   --  138*  APTT 67*  --   --   --   --   --   --   HEPARINUNFRC 0.24*   < >  --   --  0.20* 0.35 0.28*  CREATININE  --    < > 0.91 1.10* 0.88  --  0.62   < > = values in this interval not displayed.    Estimated Creatinine Clearance: 41.6 mL/min (by C-G formula based on SCr of 0.62 mg/dL).   Medical History: Past Medical History:  Diagnosis Date   Arthritis    Choledocholithiasis    Diabetes mellitus without complication (HCC)    Diverticulitis    Esophageal cancer (HCC) dx'd 2020   GERD (gastroesophageal reflux disease)    Hyperlipidemia    Hypertension    OSA (obstructive sleep apnea)    Urine incontinence    Uterine cancer (HCC)    Medications: Infusions:  heparin 1,400 Units/hr (10/31/23 0612)     Assessment: Patient is a 89 y.o F with hx PE on xarelto PTA who presented to the ED on 10/27/23 with c/o abdominal pain. Pharmacy was consulted to transition to heparin drip on  on admission. Heparin was held on 11/13 for ERCP and resumed on 11/14 AM.  Pharmacy is now  consulted to transition back to Rivaroxaban.   Today, 10/31/2023: Heparin level 0.28 this morning, slightly subtherapeutic and dose increased CBC: Hgb 10.2 and Plt 138k No bleeding or complications reported.   Goal of Therapy:  Heparin level 0.3-0.7 units/ml Monitor platelets by anticoagulation protocol: Yes   Plan:  - at 12:00 d/c heparin drip and restart Xarelto 20mg  daily - Pharmacy will sign off notes, but will continue to follow up peripherally.    Lynann Beaver PharmD, BCPS [facility] main pharmacy [phone] 10/31/2023 7:49 AM

## 2023-10-31 NOTE — Consult Note (Signed)
[facility] Liaison Consult Note    10/31/2023  Favor Massoth Cooperman Sep 13, 1928 [id number]  Insurance: [employer]   Primary Care Provider: Zola Button, Grayling Congress, DO, with [place] [facility], this provider is listed for the transition of care follow up appointments  and community [facility] calls   [facility] Liaison screened the patient remotely at [facility].    The patient was screened for 4 day hospitalization with noted high risk score for unplanned readmission risk 2 ED visit and 2 hospital admissions in 6 months.  The patient was assessed for potential Community Care Coordination service needs for post hospital transition for care coordination. Review of patient's electronic medical record reveals patient is transitioning home with daughter.   Plan: [facility] Liaison will continue to follow progress and disposition to asess for post hospital community care coordination/management needs.  Referral request for community care coordination: to be followed by [facility] Team.   [facility] does not replace or interfere with any arrangements made by the Inpatient Transition of Care team.   For questions contact:   Charlesetta Shanks, RN, BSN, CCM [place]  [facility], [facility] Liaison Direct Dial: [phone] or secure chat Email: [email or web address]

## 2023-10-31 NOTE — Progress Notes (Signed)
PHARMACY - ANTICOAGULATION CONSULT NOTE  Pharmacy Consult for Heparin Indication: pulmonary embolus  Allergies  Allergen Reactions   Gramicidin Other (See Comments)    Gramicidin is an antibiotic peptide that is used to treat local infections caused by gram-positive bacteria. Unknown reaction   Sulfa Antibiotics Rash   Wound Dressing Adhesive Rash and Other (See Comments)    Some dressings break out the skin    Patient Measurements: Height: 5\' 4"  (162.6 cm) Weight: 68 kg (149 lb 14.6 oz) IBW/kg (Calculated) : 54.7  Vital Signs: Temp: 97.8 F (36.6 C) (11/15 0513) Temp Source: Oral (11/15 0513) BP: 97/57 (11/15 0513) Pulse Rate: 61 (11/15 0513)  Labs: Recent Labs    10/28/23 1227 10/28/23 2149 10/29/23 0434 10/30/23 0433 10/30/23 0950 10/30/23 2023 10/31/23 0435  HGB  --    < > 10.1* 8.9*  --   --  10.2*  HCT  --   --  31.0* 28.8*  --   --  32.9*  PLT  --   --  138* 121*  --   --  138*  APTT 67*  --   --   --   --   --   --   HEPARINUNFRC 0.24*   < >  --   --  0.20* 0.35 0.28*  CREATININE  --    < > 0.91 1.10* 0.88  --  0.62   < > = values in this interval not displayed.    Estimated Creatinine Clearance: 41.6 mL/min (by C-G formula based on SCr of 0.62 mg/dL).   Medical History: Past Medical History:  Diagnosis Date   Arthritis    Choledocholithiasis    Diabetes mellitus without complication (HCC)    Diverticulitis    Esophageal cancer (HCC) dx'd 2020   GERD (gastroesophageal reflux disease)    Hyperlipidemia    Hypertension    OSA (obstructive sleep apnea)    Urine incontinence    Uterine cancer (HCC)     Assessment: AC/Heme: hx PE on xarelto 20 mg daily PTA (last dose taken on 10/26/23 at 7p) --> heparin drip on 11/11 - 11/13: Hep off for ERCP - 11/14: Resume IV Heparin (0200). Heparin level 0.35 now back in goal range.Marland KitchenMarland KitchenPlan DOAC resumption tomorrow. - 11/15: Heparin level slightly low at 0.28  Goal of Therapy:  Heparin level 0.3-0.7  units/ml Monitor platelets by anticoagulation protocol: Yes   Plan:  - Increase IV heparin to 1400 units/hr - Daily HL and CBC - Monitor for s/sx of bleeding  - F/u transition to DOAC    Cherylin Mylar, PharmD Clinical Pharmacist  11/15/20246:01 AM

## 2023-11-03 ENCOUNTER — Encounter: Payer: Self-pay | Admitting: Gastroenterology

## 2023-11-03 ENCOUNTER — Telehealth: Payer: Self-pay | Admitting: Gastroenterology

## 2023-11-03 ENCOUNTER — Telehealth: Payer: Self-pay | Admitting: *Deleted

## 2023-11-03 ENCOUNTER — Other Ambulatory Visit: Payer: Self-pay

## 2023-11-03 DIAGNOSIS — K8689 Other specified diseases of pancreas: Secondary | ICD-10-CM

## 2023-11-03 MED ORDER — OMEPRAZOLE 40 MG PO CPDR: 40.0000 mg | DELAYED_RELEASE_CAPSULE | Freq: Two times a day (BID) | ORAL | 0 refills | Status: DC

## 2023-11-03 MED ORDER — SUCRALFATE 1 GM/10ML PO SUSP: 1.0000 g | Freq: Two times a day (BID) | ORAL | 0 refills | Status: DC

## 2023-11-03 NOTE — Telephone Encounter (Signed)
The pt did not get prescription for carafate or the increase of PPI sent to her pharmacies.  I have reviewed and per the ERCP note the medications have been sent to her mail order for PPI and local pharmacy for carafate per the pt daughter instructions. Nothing further needed at this time.

## 2023-11-03 NOTE — Discharge Summary (Addendum)
Physician Discharge Summary   Patient: Sherry Holloway MRN: [id number] DOB: 1928/07/26  Admit date:     10/27/2023  Discharge date: 10/31/2023  Discharge Physician: Lynden Oxford  PCP: Donato Schultz, DO  Recommendations at discharge: Follow-up with PCP in 1 week. Follow-up with GI Follow-up with orthopedic as already scheduled before   Follow-up Information     Donato Schultz, DO. Schedule an appointment as soon as possible for a visit in 1 week(s).   Specialty: Family Medicine Contact information: [address] [place] [address] [phone]         [facility]. Schedule an appointment as soon as possible for a visit in 1 month(s).   Specialty: Gastroenterology Why: for follow up and discussion about stent removal timing. Contact information: [address] 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 Present to the hospital because of abdominal pain. Found to have choledocholithiasis again. Underwent ERCP with stent placement.  Assessment and Plan: Acute biliary colic with transaminitis secondary to choledocholithiasis Underwent ERCP, Sphincteroplasty and stent placed. Tolerating oral diet.  No bleeding on heparin as well as on Eliquis. GI currently  signed off.  Outpatient follow-up for stent removal.  Right knee swelling. Per family appears to be chronic.  Currently stable in the hospital for more than 48 hours.  Recommend outpatient follow-up.  Diabetes mellitus, uncontrolled with hyperglycemia without long-term insulin use. On Jardiance glimepiride on hold. Hemoglobin A1c 7.2.  Continue home regimen.  Chronic pain. On MS Contin. Will monitor for now.  History of PE. On Xarelto at home. Tolerating anticoagulation. Continue home regimen.  HTN. Continue blood pressure medication for now.  Chronic diastolic CHF. Volume status adequate. On Bumex. On hold.  Hyponatremia. Hypokalemia. Corrected.  Hyperglycemia Likely lab error.  Consultants:  GI  Procedures performed:  ERCP  DISCHARGE MEDICATION: Allergies as of 10/31/2023       Reactions   Gramicidin Other (See Comments)   Gramicidin is an antibiotic peptide that is used to treat local infections caused by gram-positive bacteria. Unknown reaction   Sulfa Antibiotics Rash   Wound Dressing Adhesive Rash, Other (See Comments)   Some dressings break out the skin        Medication List     STOP taking these medications    glimepiride 4 MG tablet Commonly known as: AMARYL   valsartan 40 MG tablet Commonly known as: DIOVAN       TAKE these medications    bumetanide 1 MG tablet Commonly known as: BUMEX Take 0.5-1 tablets (0.5-1 mg total) by mouth daily as needed (swelling). What changed:  how much to take when to take this reasons to take this   ciprofloxacin 500 MG tablet Commonly known as: CIPRO Take 1 tablet (500 mg total) by mouth 2 (two) times  daily for 3 days.   Creon 36000-114000 units Cpep capsule Generic drug: lipase/protease/amylase TAKE 1 CAPSULE THREE TIMES A DAY BEFORE MEALS What changed: See the new instructions.   docusate sodium 100 MG capsule Commonly known as: Colace Take 1 capsule (100 mg total) by mouth 2 (two) times  daily. What changed:  how much to take when to take this additional instructions   famciclovir 125 MG tablet Commonly known as: FAMVIR TAKE 1 TABLET DAILY   FREESTYLE LITE test strip Generic drug: glucose blood CHECK BLOOD SUGAR TWICE A DAY   Jardiance 25 MG Tabs tablet Generic drug: empagliflozin TAKE 1 TABLET DAILY BEFORE BREAKFAST   latanoprost 0.005 % ophthalmic solution Commonly known as: XALATAN Place 1 drop into both eyes at bedtime.   linaclotide 145 MCG Caps capsule Commonly known as: Linzess Take 1 capsule (145 mcg total) by mouth daily before breakfast.   mirtazapine 15 MG tablet Commonly known as: REMERON TAKE 1 TABLET AT BEDTIME   morphine 30 MG 12 hr tablet Commonly known as: MS CONTIN Take 30 mg by mouth every 12 (twelve) hours.   multivitamin with minerals Tabs tablet Take 1 tablet by mouth daily with supper.   omeprazole 20 MG capsule Commonly known as: PRILOSEC Take 1 capsule (20 mg total) by mouth 2 (two) times daily before a meal.   senna 8.6 MG Tabs tablet Commonly known as: SENOKOT Take 1 tablet (8.6 mg total) by mouth daily.   Senna S 8.6-50 MG tablet Generic drug: senna-docusate Take 1 tablet by mouth See admin instructions. Take 1 tablet by mouth in the morning and evening   TYLENOL 500 MG tablet Generic drug: acetaminophen Take 500-1,000 mg by mouth every 6 (six) hours as needed for mild pain (pain score 1-3) or headache.   Xarelto 20 MG Tabs tablet Generic drug: rivaroxaban TAKE 1 TABLET DAILY WITH SUPPER, START ONLY AFTER THE STARTER PACK HAS BEEN COMPLETED What changed: See the new instructions.       Disposition: Home Diet recommendation: Carb modified diet  Discharge Exam: Vitals:   10/30/23 1342 10/30/23 2050 10/31/23 0513 10/31/23 1446  BP: (!) 125/36 (!) 98/58 (!) 97/57 (!) 169/47  Pulse: (!) 48 (!) 53 61 62  Resp: 18 16 15 18   Temp: 98.2 F (36.8 C) 98.2 F (36.8 C) 97.8 F (36.6 C) 98 F (36.7 C)  TempSrc:  Oral Oral Oral Oral  SpO2: 100% 96% 96% 99%  Weight:      Height:       General: Appear in no distress; no visible Abnormal Neck Mass Or lumps, Conjunctiva normal Cardiovascular: S1 and S2 Present, no Murmur, Respiratory: good respiratory effort, Bilateral Air entry present and CTA, no Crackles, no wheezes Abdomen: Bowel Sound present, Non tender  Extremities: Unchanged fluctuant right knee swelling, no pedal edema Neurology: alert and oriented to time, place, and person  Filed Weights   10/27/23 1811 10/27/23 1852 10/29/23 1139  Weight: 68 kg 68 kg 68 kg   Condition at discharge: stable  The results of significant diagnostics from this hospitalization (including imaging, microbiology, ancillary and laboratory) are listed below for reference.   Imaging Studies: DG ERCP  Result Date: 10/30/2023 CLINICAL DATA:  66-year-old female with history of choledocholithiasis. EXAM: ERCP TECHNIQUE: Multiple spot images obtained with the fluoroscopic device and submitted for interpretation post-procedure. FLUOROSCOPY TIME:  Fluoroscopy Time:  4 minutes, 33 seconds Radiation Exposure Index (if provided by the fluoroscopic device): 54.96 mGy Number of Acquired Spot Images: 17  COMPARISON:  10/27/2023, 10/28/2023 FINDINGS: Endoscope is positioned in the duodenum. There is retrograde cannulation of the common bile duct. Balloon dilation of the region of the ampulla. Retrograde cholangiogram demonstrates multiple rounded filling defects throughout the common bile duct which appears moderately dilated. There is suggestion of moderate central intrahepatic biliary ductal dilation. Two plastic common bile duct stents were placed. IMPRESSION: ERCP as above. These images were submitted for radiologic interpretation only. Please see the procedural report for the full procedural details, amount of contrast, and the fluoroscopy time utilized. Marliss Coots, MD [facility]  Radiology Electronically Signed   By: Marliss Coots M.D.   On: 10/30/2023 07:49   DG C-Arm 1-60 Min-No Report  Result Date: 10/29/2023 Fluoroscopy was utilized by the requesting physician.  No radiographic interpretation.   CT PANCREAS ABDOMEN W WO CONTRAST  Result Date: 10/28/2023 CLINICAL DATA:  Abdominal pain, jaundice, IPMN suspected * Tracking Code: BO * EXAM: CT ABDOMEN WITHOUT AND WITH CONTRAST TECHNIQUE: Multidetector CT imaging of the abdomen was performed following the standard protocol before and following the bolus administration of intravenous contrast. RADIATION DOSE REDUCTION: This exam was performed according to the departmental dose-optimization program which includes automated exposure control, adjustment of the mA and/or kV according to patient size and/or use of iterative reconstruction technique. CONTRAST:  80mL OMNIPAQUE IOHEXOL 350 MG/ML SOLN COMPARISON:  Abdominal ultrasound, 10/27/2023, CT abdomen pelvis, 11/25/2021 FINDINGS: Lower chest: No acute abnormality. Coronary artery calcifications. Bibasilar scarring or atelectasis. Hepatobiliary: No focal liver abnormality is seen. Status post cholecystectomy. Severe intra and extrahepatic biliary ductal dilatation. The common bile duct, common hepatic duct, and remnant cystic duct are packed with faintly calcified gallstones, measuring up to 1.5 cm (series 13, image 36). Pancreas: Significant interval enlargement of a cystic lesion within the pancreatic head extending to the ampulla, measuring 4.1 x 3.3 cm (series 7, image 55). Severe pancreatic atrophy with diffuse dilatation of the pancreatic duct measuring up to 1.2 cm in caliber (series 7, image 39). Spleen: Normal in size without significant abnormality. Adrenals/Urinary Tract: Adrenal glands are unremarkable. Kidneys are normal, without renal calculi, solid lesion, or hydronephrosis. Stomach/Bowel: Stomach is within normal limits. No evidence of bowel wall thickening, distention,  or inflammatory changes. Large burden of stool throughout the included colon. Vascular/Lymphatic: Aortic atherosclerosis. No enlarged abdominal or pelvic lymph nodes. Other: No abdominal wall hernia or abnormality. No ascites. Musculoskeletal: No acute or significant osseous findings. IMPRESSION: 1. Status post cholecystectomy. Severe intra and extrahepatic biliary ductal dilatation. The common bile duct, common hepatic duct, and remnant cystic duct are packed with faintly calcified gallstones, measuring up to 1.5 cm. 2. Significant interval enlargement of a cystic lesion within the pancreatic head extending to the ampulla, measuring 4.1 x 3.3 cm. Severe pancreatic atrophy with diffuse dilatation of the pancreatic duct measuring up to 1.2 cm in caliber. 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 Consider EUS/FNA if not already performed and if clinically appropriate given advanced patient age. 3. Large burden of stool. 4. Coronary artery disease. Aortic Atherosclerosis (ICD10-I70.0). Electronically Signed   By: Jearld Lesch M.D.   On: 10/28/2023 15:31   US Abdomen Limited RUQ (LIVER/GB)  Result Date: 10/27/2023 CLINICAL DATA:  Elevated liver enzymes. EXAM: ULTRASOUND ABDOMEN LIMITED RIGHT UPPER QUADRANT COMPARISON:  None Available. FINDINGS: Gallbladder: Gallbladder is surgically absent. Common bile duct: Diameter: Dilated measuring 18 mm. There are filling defects/echogenic areas within the common bile  duct measuring up to 14 mm. There is also intrahepatic biliary ductal dilatation. Liver: No focal lesion identified. Mildly diffusely heterogeneous echotexture. Within normal limits in parenchymal echogenicity. Portal vein is patent on color Doppler imaging with normal direction of blood flow towards the liver. Other: None. IMPRESSION: 1. Dilated common bile duct measuring 18 mm with filling defects/echogenic areas within the common bile  duct measuring up to 14 mm. Findings are concerning for choledocholithiasis. Recommend further evaluation with MRCP. 2. Intrahepatic biliary ductal dilatation. 3. Status post cholecystectomy. Electronically Signed   By: Darliss Cheney M.D.   On: 10/27/2023 23:11    Microbiology: Results for orders placed or performed during the hospital encounter of 11/25/21  Resp Panel by RT-PCR (Flu A&B, Covid) Nasopharyngeal Swab     Status: None   Collection Time: 11/25/21  8:15 PM   Specimen: Nasopharyngeal Swab; Nasopharyngeal(NP) swabs in vial transport medium  Result Value Ref Range Status   SARS Coronavirus 2 by RT PCR NEGATIVE NEGATIVE Final    Comment: (NOTE) SARS-CoV-2 target nucleic acids are NOT DETECTED.  The SARS-CoV-2 RNA is generally detectable in upper respiratory specimens during the acute phase of infection. The lowest concentration of SARS-CoV-2 viral copies this assay can detect is 138 copies/mL. A negative result does not preclude SARS-Cov-2 infection and should not be used as the sole basis for treatment or other patient management decisions. A negative result may occur with  improper specimen collection/handling, submission of specimen other than nasopharyngeal swab, presence of viral mutation(s) within the areas targeted by this assay, and inadequate number of viral copies(<138 copies/mL). A negative result must be combined with clinical observations, patient history, and epidemiological information. The expected result is Negative.  Fact Sheet for Patients:  BloggerCourse.com  Fact Sheet for Healthcare Providers:  SeriousBroker.it  This test is no t yet approved or cleared by the [place] FDA and  has been authorized for detection and/or diagnosis of SARS-CoV-2 by FDA under an Emergency Use Authorization (EUA). This EUA will remain  in effect (meaning this test can be used) for the duration of the COVID-19 declaration  under Section 564(b)(1) of the Act, 21 U.S.C.section 360bbb-3(b)(1), unless the authorization is terminated  or revoked sooner.       Influenza A by PCR NEGATIVE NEGATIVE Final   Influenza B by PCR NEGATIVE NEGATIVE Final    Comment: (NOTE) The Xpert Xpress SARS-CoV-2/FLU/RSV plus assay is intended as an aid in the diagnosis of influenza from Nasopharyngeal swab specimens and should not be used as a sole basis for treatment. Nasal washings and aspirates are unacceptable for Xpert Xpress SARS-CoV-2/FLU/RSV testing.  Fact Sheet for Patients: BloggerCourse.com  Fact Sheet for Healthcare Providers: SeriousBroker.it  This test is not yet approved or cleared by the [place] FDA and has been authorized for detection and/or diagnosis of SARS-CoV-2 by FDA under an Emergency Use Authorization (EUA). This EUA will remain in effect (meaning this test can be used) for the duration of the COVID-19 declaration under Section 564(b)(1) of the Act, 21 U.S.C. section 360bbb-3(b)(1), unless the authorization is terminated or revoked.  Performed at [facility], [address] [place], [place] [address]   Resp Panel by RT-PCR (Flu A&B, Covid) Nasopharyngeal Swab     Status: None   Collection Time: 11/29/21  1:49 PM   Specimen: Nasopharyngeal Swab; Nasopharyngeal(NP) swabs in vial transport medium  Result Value Ref Range Status   SARS Coronavirus 2 by RT PCR NEGATIVE NEGATIVE Final  Comment: (NOTE) SARS-CoV-2 target nucleic acids are NOT DETECTED.  The SARS-CoV-2 RNA is generally detectable in upper respiratory specimens during the acute phase of infection. The lowest concentration of SARS-CoV-2 viral copies this assay can detect is 138 copies/mL. A negative result does not preclude SARS-Cov-2 infection and should not be used as the sole basis for treatment or other patient management decisions. A negative result may occur  with  improper specimen collection/handling, submission of specimen other than nasopharyngeal swab, presence of viral mutation(s) within the areas targeted by this assay, and inadequate number of viral copies(<138 copies/mL). A negative result must be combined with clinical observations, patient history, and epidemiological information. The expected result is Negative.  Fact Sheet for Patients:  BloggerCourse.com  Fact Sheet for Healthcare Providers:  SeriousBroker.it  This test is no t yet approved or cleared by the [place] FDA and  has been authorized for detection and/or diagnosis of SARS-CoV-2 by FDA under an Emergency Use Authorization (EUA). This EUA will remain  in effect (meaning this test can be used) for the duration of the COVID-19 declaration under Section 564(b)(1) of the Act, 21 U.S.C.section 360bbb-3(b)(1), unless the authorization is terminated  or revoked sooner.       Influenza A by PCR NEGATIVE NEGATIVE Final   Influenza B by PCR NEGATIVE NEGATIVE Final    Comment: (NOTE) The Xpert Xpress SARS-CoV-2/FLU/RSV plus assay is intended as an aid in the diagnosis of influenza from Nasopharyngeal swab specimens and should not be used as a sole basis for treatment. Nasal washings and aspirates are unacceptable for Xpert Xpress SARS-CoV-2/FLU/RSV testing.  Fact Sheet for Patients: BloggerCourse.com  Fact Sheet for Healthcare Providers: SeriousBroker.it  This test is not yet approved or cleared by the [place] FDA and has been authorized for detection and/or diagnosis of SARS-CoV-2 by FDA under an Emergency Use Authorization (EUA). This EUA will remain in effect (meaning this test can be used) for the duration of the COVID-19 declaration under Section 564(b)(1) of the Act, 21 U.S.C. section 360bbb-3(b)(1), unless the authorization is terminated  or revoked.  Performed at [facility], [address] [place], [place] [address]    Labs: CBC: Recent Labs  Lab 10/27/23 1522 10/28/23 0444 10/29/23 0434 10/30/23 0433 10/31/23 0435  WBC 13.7* 9.8 6.5 4.9 4.0  NEUTROABS 12.6*  --   --   --   --   HGB 12.6 11.2* 10.1* 8.9* 10.2*  HCT 40.4 34.4* 31.0* 28.8* 32.9*  MCV 90.2 88.0 87.6 90.6 89.4  PLT 199 169 138* 121* 138*   Basic Metabolic Panel: Recent Labs  Lab 10/28/23 0444 10/29/23 0434 10/30/23 0433 10/30/23 0950 10/31/23 0435  NA 136 130* 128* 133* 134*  K 3.9 3.8 2.9* 3.3* 3.7  CL 108 103 101 104 107  CO2 21* 21* 22 21* 22  GLUCOSE 100* 85 471* 141* 184*  BUN 29* 18 19 18 18   CREATININE 0.90 0.91 1.10* 0.88 0.62  CALCIUM 8.8* 8.3* 7.7* 9.0 8.6*  MG 2.1  --  2.0  --  2.2   Liver Function Tests: Recent Labs  Lab 10/27/23 1522 10/28/23 0444 10/29/23 0434 10/30/23 0433 10/31/23 0435  AST 312* 683* 143* 30 20  ALT 163* 650* 356* 199* 149*  ALKPHOS 265* 310* 232* 176* 192*  BILITOT 1.4* 2.2* 1.5* 0.7 0.6  PROT 7.2 5.7* 5.1* 4.7* 5.3*  ALBUMIN 3.7 2.9* 2.5* 2.2* 2.4*   CBG: Recent Labs  Lab 10/30/23 1143 10/30/23 1629 10/30/23 2047  10/31/23 0734 10/31/23 1134  GLUCAP 203* 179* 246* 140* 164*    Discharge time spent: greater than 30 minutes.  Author: Lynden Oxford, MD  [facility] 10/31/2023

## 2023-11-03 NOTE — Transitions of Care (Post Inpatient/ED Visit) (Signed)
11/03/2023  Name: Sherry Holloway MRN: [id number] DOB: 04-10-1928  Today's TOC FU Call Status: Today's TOC FU Call Status:: Successful TOC FU Call Completed TOC FU Call Complete Date: 11/03/23 Patient's Name and Date of Birth confirmed.  Transition Care Management Follow-up Telephone Call Date of Discharge: 10/31/23 Discharge Facility: [facility]) Type of Discharge: Inpatient Admission Primary Inpatient Discharge Diagnosis:: Abdominal pain How have you been since you were released from the hospital?: Better Any questions or concerns?: No  Items Reviewed: Did you receive and understand the discharge instructions provided?: Yes Medications obtained,verified, and reconciled?: Yes (Medications Reviewed) (Patient daughter has followed up with gastroenterologist and [facility]) Any new allergies since your discharge?: No Dietary orders reviewed?: No Do you have support at home?: Yes People in Home: alone Name of Support/Comfort Primary Source: Jodette daughter  Medications Reviewed Today: Medications Reviewed Today     Reviewed by Luella Cook, RN (Case Manager) on 11/03/23 at 1342  Med List Status: <None>   Medication Order Taking? Sig Documenting Provider Last Dose Status Informant  bumetanide (BUMEX) 1 MG tablet [id number] Yes Take 0.5-1 tablets (0.5-1 mg total) by mouth daily as needed (swelling). Rolly Salter, MD Taking Active   ciprofloxacin (CIPRO) 500 MG tablet [id number] Yes Take 1 tablet (500 mg total) by mouth 2 (two) times daily for 3 days. Rolly Salter, MD Taking Active   CREON [phone] units CPEP capsule [id number] Yes TAKE 1 CAPSULE THREE TIMES A DAY BEFORE MEALS  Patient taking differently: Take 36,000 Units by mouth See admin instructions. Take 36,000 units by mouth before breakfast and supper/evening meal   Zola Button, Grayling Congress, DO Taking Active Family Member  docusate sodium (COLACE) 100 MG capsule  [id number] Yes Take 1 capsule (100 mg total) by mouth 2 (two) times daily.  Patient taking differently: Take 100-200 mg by mouth See admin instructions. Take 200 mg by mouth in the morning and 100 mg in the evening   Osvaldo Shipper, MD Taking Active Family Member  famciclovir [facility]) 125 MG tablet [id number] Yes TAKE 1 TABLET DAILY Donato Schultz, DO Taking Active Family Member  glucose blood (FREESTYLE LITE) test strip [id number] Yes CHECK BLOOD SUGAR TWICE A DAY Donato Schultz, DO Taking Active   JARDIANCE 25 MG TABS tablet [id number] Yes TAKE 1 TABLET DAILY BEFORE BREAKFAST Donato Schultz, DO Taking Active Family Member  latanoprost (XALATAN) 0.005 % ophthalmic solution [id number] Yes Place 1 drop into both eyes at bedtime. Donato Schultz, DO Taking Active Family Member  linaclotide [facility]) 145 MCG CAPS capsule [id number] Yes Take 1 capsule (145 mcg total) by mouth daily before breakfast. Zola Button, Grayling Congress, DO Taking Active Family Member  mirtazapine (REMERON) 15 MG tablet [id number] Yes TAKE 1 TABLET AT BEDTIME Donato Schultz, DO Taking Active Family Member  morphine (MS CONTIN) 30 MG 12 hr tablet [id number] Yes Take 30 mg by mouth every 12 (twelve) hours. [provider] Taking Active Family Member  Multiple Vitamin (MULTIVITAMIN WITH MINERALS) TABS tablet [id number] Yes Take 1 tablet by mouth daily with supper. [provider] Taking Active Family Member  omeprazole (PRILOSEC) 20 MG capsule [id number] Yes Take 1 capsule (20 mg total) by mouth 2 (two) times daily before a meal. Zola Button, Grayling Congress, DO Taking Active Family Member  omeprazole (PRILOSEC) 40 MG capsule [id number] Yes Take 1 capsule (40 mg total) by mouth in the  morning and at bedtime. Mansouraty, Netty Starring., MD Taking Active   senna (SENOKOT) 8.6 MG TABS tablet [id number] No Take 1 tablet (8.6 mg total) by mouth daily.  Patient not taking: Reported on 10/27/2023   Alba Cory, MD Not Taking Active Family Member  senna-docusate (SENNA S) 8.6-50 MG tablet [id number] Yes Take 1 tablet by mouth See admin instructions. Take 1 tablet by mouth in the morning and evening [provider] Taking Active Family Member  sucralfate (CARAFATE) 1 GM/10ML suspension [id number] Yes Take 10 mLs (1 g total) by mouth 2 (two) times daily. Mansouraty, Netty Starring., MD Taking Active   TYLENOL 500 MG tablet [id number] Yes Take 500-1,000 mg by mouth every 6 (six) hours as needed for mild pain (pain score 1-3) or headache. [provider] Taking Active Family Member  XARELTO 20 MG TABS tablet [id number] Yes TAKE 1 TABLET DAILY WITH SUPPER, START ONLY AFTER THE STARTER PACK HAS BEEN COMPLETED  Patient taking differently: Take 20 mg by mouth daily with supper.   Donato Schultz, DO Taking Active Family Member            Home Care and Equipment/Supplies: Were Home Health Services Ordered?: NA Any new equipment or medical supplies ordered?: NA  Functional Questionnaire: Do you need assistance with bathing/showering or dressing?: No Do you need assistance with meal preparation?: No Do you need assistance with eating?: No Do you have difficulty maintaining continence: No Do you need assistance with getting out of bed/getting out of a chair/moving?: No Do you have difficulty managing or taking your medications?: No  Follow up appointments reviewed: PCP Follow-up appointment confirmed?: Yes Date of PCP follow-up appointment?: 11/07/23 Follow-up Provider: Seabron Spates DO [id number]/ Ortho [id number] Specialist Hospital Follow-up appointment confirmed?: Yes Date of Specialist follow-up appointment?: 11/03/23 (daughter spoke with dr office and got meds ordered) Follow-Up Specialty Provider:: Gi specialist Do you need transportation to your follow-up appointment?: No Do you understand care options if your condition(s) worsen?: Yes-patient verbalized  understanding  SDOH Interventions Today    Flowsheet Row Most Recent Value  SDOH Interventions   Food Insecurity Interventions Intervention Not Indicated  Housing Interventions Intervention Not Indicated  Transportation Interventions Intervention Not Indicated, Patient Resources (Friends/Family)  Utilities Interventions Intervention Not Indicated      Gean Maidens BSN RN Population Health- Transition of Care Team.  [facility] [phone]

## 2023-11-03 NOTE — Telephone Encounter (Signed)
PT daughter is calling to discuss the new medications recommended during discharge for her mother. Please advise.

## 2023-11-04 LAB — SURGICAL PATHOLOGY

## 2023-11-07 ENCOUNTER — Ambulatory Visit: Payer: Medicare Other | Admitting: Family Medicine

## 2023-11-07 ENCOUNTER — Encounter: Payer: Self-pay | Admitting: Family Medicine

## 2023-11-07 VITALS — BP 120/60 | HR 52 | Temp 97.7°F | Resp 18 | Ht 64.0 in | Wt 152.2 lb

## 2023-11-07 DIAGNOSIS — E785 Hyperlipidemia, unspecified: Secondary | ICD-10-CM

## 2023-11-07 DIAGNOSIS — C154 Malignant neoplasm of middle third of esophagus: Secondary | ICD-10-CM

## 2023-11-07 DIAGNOSIS — E1169 Type 2 diabetes mellitus with other specified complication: Secondary | ICD-10-CM | POA: Diagnosis not present

## 2023-11-07 DIAGNOSIS — I10 Essential (primary) hypertension: Secondary | ICD-10-CM

## 2023-11-07 DIAGNOSIS — E1142 Type 2 diabetes mellitus with diabetic polyneuropathy: Secondary | ICD-10-CM

## 2023-11-07 DIAGNOSIS — R748 Abnormal levels of other serum enzymes: Secondary | ICD-10-CM

## 2023-11-07 DIAGNOSIS — D490 Neoplasm of unspecified behavior of digestive system: Secondary | ICD-10-CM

## 2023-11-07 NOTE — Patient Instructions (Signed)
It is important to avoid accidents which may result in broken bones.  Here are a few ideas on how to make your home safer so you will be less likely to trip or fall.  Use nonskid mats or non slip strips in your shower or tub, on your bathroom floor and around sinks.  If you know that you have spilled water, wipe it up! In the bathroom, it is important to have properly installed grab bars on the walls or on the edge of the tub.  Towel racks are NOT strong enough for you to hold onto or to pull on for support. Stairs and hallways should have enough light.  Add lamps or night lights if you need ore light. It is good to have handrails on both sides of the stairs if possible.  Always fix broken handrails right away. It is important to see the edges of steps.  Paint the edges of outdoor steps white so you can see them better.  Put colored tape on the edge of inside steps. Throw-rugs are dangerous because they can slide.  Removing the rugs is the best idea, but if they must stay, add adhesive carpet tape to prevent slipping. Do not keep things on stairs or in the halls.  Remove small furniture that blocks the halls as it may cause you to trip.  Keep telephone and electrical cords out of the way where you walk. Always were sturdy, rubber-soled shoes for good support.  Never wear just socks, especially on the stairs.  Socks may cause you to slip or fall.  Do not wear full-length housecoats as you can easily trip on the bottom.  Place the things you use the most on the shelves that are the easiest to reach.  If you use a stepstool, make sure it is in good condition.  If you feel unsteady, DO NOT climb, ask for help. If a health professional advises you to use a cane or walker, do not be ashamed.  These items can keep you from falling and breaking your bones.

## 2023-11-07 NOTE — Assessment & Plan Note (Signed)
Per GI and oncology

## 2023-11-07 NOTE — Telephone Encounter (Signed)
Carafate needs to have a PA to be filled. Daughter is calling to have a nurse let her know when it is filled. Please advise.

## 2023-11-07 NOTE — Assessment & Plan Note (Signed)
Pt has fu with GI

## 2023-11-07 NOTE — Assessment & Plan Note (Signed)
Per oncology

## 2023-11-07 NOTE — Assessment & Plan Note (Signed)
Check labs today Meds d/c in hospital

## 2023-11-07 NOTE — Progress Notes (Signed)
Established Patient Office Visit  Subjective   Patient ID: Sherry Holloway, female    DOB: [birth year]  Age: 89 y.o. MRN: [id number]  Chief Complaint  Patient presents with   Hospitalization Follow-up    Pt would like to discuss medications that were stopped by hospital.    HPI Discussed the use of AI scribe software for clinical note transcription with the patient, who gave verbal consent to proceed.  History of Present Illness   The patient, with a history of multiple medical conditions, presents for a follow-up visit. She reports that her bowel function has improved significantly after a period of constipation. The patient was taken off several medications, including Amaryl and Diovan, due to concerns about low blood pressure and hypoglycemia. She reports that her appetite has decreased, and she is eating less than usual.  The patient also reports issues with her hearing aids, which were initially functional but have since been problematic. She is scheduled for a follow-up appointment to address this issue.  The patient has a history of a persistent, size-changing mass, suspected to be a hematoma. The mass does not cause pain and varies in size depending on the patient's activity level. Multiple consultations have been sought for this issue, but due to the patient's age and potential risks, no intervention has been pursued.  The patient's blood work results were previously abnormal, with out-of-range values for hemoglobin, ALT, AST, creatinine, and alkaline phosphatase. However, these values were reportedly improving. The patient is due for follow-up labs to monitor these levels.  The patient received her flu shot approximately six weeks ago at a local pharmacy. She has chosen not to receive additional COVID-19 vaccinations. She has also received a pneumonia vaccine.      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) 10/29/2023   Abdominal pain 10/27/2023   History of pulmonary embolism 10/27/2023   Elevated liver enzymes 10/27/2023   Symptomatic sinus bradycardia 07/27/2023   Bradycardia 05/02/2023   Dizziness 05/02/2023   At high risk for injury related to fall 02/06/2023   Balance disorder 01/30/2023   Vulvar rash 04/02/2022   Hyperlipidemia associated with type 2 diabetes mellitus (HCC) 04/02/2022   Rash and nonspecific skin eruption 02/19/2022   Chronic pain disorder 11/26/2021   Choledocholithiasis 11/25/2021   Personal history of esophageal cancer 08/26/2021   Weight loss, unintentional 08/26/2021   Type 2 diabetes mellitus with diabetic polyneuropathy, without long-term current use of insulin (HCC) 06/12/2021   Snoring 04/30/2021   At risk for central sleep apnea 04/30/2021   Acute pulmonary embolism (HCC) 03/29/2021   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) 03/19/2021   Other pulmonary embolism with acute cor pulmonale, unspecified chronicity (HCC)    Subclavian arterial stenosis (HCC) 02/13/2021   Acquired trigger finger of left middle finger 01/09/2021   Acquired trigger finger of left ring finger 01/09/2021   Trigger finger of left hand 12/24/2020   Plantar wart 12/24/2020   Pancreatic mass 10/19/2020   Gastroesophageal reflux disease with esophagitis without hemorrhage 10/19/2020   Hypertension 10/19/2020   Uncontrolled type 2 diabetes mellitus with hyperglycemia, without long-term current use of insulin (HCC) 10/19/2020   Vaginal odor 10/19/2020   Hyperlipidemia 10/19/2020   OSA (obstructive sleep apnea) 10/19/2020   Chronic bilateral back pain  10/19/2020   Past Medical History:  Diagnosis Date   Arthritis    Choledocholithiasis    Diabetes mellitus without complication (HCC)    Diverticulitis    Esophageal cancer (HCC)  dx'd 2020   GERD (gastroesophageal reflux disease)    Hyperlipidemia    Hypertension    OSA (obstructive sleep apnea)    Urine incontinence    Uterine cancer (HCC)    Past Surgical History:  Procedure Laterality Date   ABDOMINAL HYSTERECTOMY  1972   APPENDECTOMY     BALLOON DILATION N/A 10/29/2023   Procedure: Billiary BALLOON DILATION;  Surgeon: Lemar Lofty., MD;  Location: [facility];  Service: Gastroenterology;  Laterality: N/A;   BILIARY DILATION  03/05/2021   Procedure: BILIARY DILATION;  Surgeon: Meridee Score Netty Starring., MD;  Location: [facility];  Service: Gastroenterology;;   BILIARY DILATION  11/27/2021   Procedure: BILIARY DILATION;  Surgeon: Rachael Fee, MD;  Location: [facility] ENDOSCOPY;  Service: Endoscopy;;   BILIARY DILATION  02/11/2022   Procedure: BILIARY DILATION;  Surgeon: Lemar Lofty., MD;  Location: [facility];  Service: Gastroenterology;;   BILIARY STENT PLACEMENT  11/27/2021   Procedure: BILIARY STENT PLACEMENT;  Surgeon: Rachael Fee, MD;  Location[facility] END[facility]COPY;  Service: Endoscopy;;   BILIARY STENT PLACEMENT N/A 10/29/2023   Procedure: BILIARY STENT PLACEMENT;  Surgeon: Lemar Lofty., MD;  Location: [facility];  Service: Gastroenterology;  Laterality: N/A;   BIOPSY  03/05/2021   Procedure: BIOPSY;  Surgeon: Lemar Lofty., MD;  Location: [facility];  Service: Gastroenterology;;   BIOPSY  11/27/2021   Procedure: BIOPSY;  Surgeon: Rachael Fee, MD;  Location: [facility] ENDOSCOPY;  Service: Endoscopy;;   BIOPSY  10/29/2023   Procedure: BIOPSY;  Surgeon: Lemar Lofty., MD;  Location: [facility];  Service: Gastroenterology;;   CHOLECYSTECTOMY     ENDOSCOPIC RETROGRADE CHOLANGIOPANCREATOGRAPHY (ERCP) WITH PROPOFOL N/A 03/05/2021   Procedure: ENDOSCOPIC RETROGRADE CHOLANGIOPANCREATOGRAPHY (ERCP) WITH PROPOFOL;  Surgeon: Lemar Lofty., MD;  Location: [facility];  Service: Gastroenterology;   Laterality: N/A;   ENDOSCOPIC RETROGRADE CHOLANGIOPANCREATOGRAPHY (ERCP) WITH PROPOFOL N/A 11/27/2021   Procedure: ENDOSCOPIC RETROGRADE CHOLANGIOPANCREATOGRAPHY (ERCP) WITH PROPOFOL;  Surgeon: Rachael Fee, MD;  Location: [facility] ENDOSCOPY;  Service: Endoscopy;  Laterality: N/A;  possible EGD prior   ENDOSCOPIC RETROGRADE CHOLANGIOPANCREATOGRAPHY (ERCP) WITH PROPOFOL N/A 02/11/2022   Procedure: ENDOSCOPIC RETROGRADE CHOLANGIOPANCREATOGRAPHY (ERCP) WITH PROPOFOL;  Surgeon: Meridee Score Netty Starring., MD;  Location: [facility];  Service: Gastroenterology;  Laterality: N/A;   ERCP N/A 10/29/2023   Procedure: ENDOSCOPIC RETROGRADE CHOLANGIOPANCREATOGRAPHY (ERCP);  Surgeon: Lemar Lofty., MD;  Location: [facility];  Service: Gastroenterology;  Laterality: N/A;   ESOPHAGOGASTRODUODENOSCOPY N/A 11/27/2021   Procedure: ESOPHAGOGASTRODUODENOSCOPY (EGD);  Surgeon: Rachael Fee, MD;  Location: [facility] ENDOSCOPY;  Service: Endoscopy;  Laterality: N/A;   ESOPHAGOGASTRODUODENOSCOPY N/A 10/29/2023   Procedure: ESOPHAGOGASTRODUODENOSCOPY (EGD);  Surgeon: Lemar Lofty., MD;  Location: [facility];  Service: Gastroenterology;  Laterality: N/A;   ESOPHAGOGASTRODUODENOSCOPY (EGD) WITH PROPOFOL N/A 03/05/2021   Procedure: ESOPHAGOGASTRODUODENOSCOPY (EGD) WITH PROPOFOL;  Surgeon: Meridee Score Netty Starring., MD;  Location: [facility];  Service: Gastroenterology;  Laterality: N/A;   EUS N/A 03/05/2021   Procedure: UPPER ENDOSCOPIC ULTRASOUND (EUS) RADIAL;  Surgeon: Meridee Score Netty Starring., MD;  Location: [facility];  Service: Gastroenterology;  Laterality: N/A;   LITHOTRIPSY  02/11/2022   Procedure: LITHOTRIPSY;  Surgeon: Meridee Score Netty Starring., MD;  Location: [facility];  Service: Gastroenterology;;   LITHOTRIPSY  10/29/2023   Procedure: LITHOTRIPSY;  Surgeon: Meridee Score,  Netty Starring., MD;  Location: [facility];  Service: Gastroenterology;;   LUMBAR LAMINECTOMY     REMOVAL OF STONES   03/05/2021   Procedure: REMOVAL OF STONES;  Surgeon: Lemar Lofty., MD;  Location: [facility];  Service: Gastroenterology;;   REMOVAL OF STONES  11/27/2021   Procedure: REMOVAL OF STONES;  Surgeon: Rachael Fee, MD;  Location: [facility] ENDOSCOPY;  Service: Endoscopy;;   REMOVAL OF STONES  02/11/2022   Procedure: REMOVAL OF STONES;  Surgeon: Lemar Lofty., MD;  Location: [facility]COPY;  Service: Gastroenterology;;   REMOVAL OF STONES  10/29/2023   Procedure: REMOVAL OF STONES;  Surgeon: Lemar Lofty., MD;  Location: [facility]COPY;  Service: Gastroenterology;;   SPINAL CORD STIMULATOR IMPLANT     Not active any more.   SPYGLASS CHOLANGIOSCOPY N/A 03/05/2021   Procedure: SPYGLASS CHOLANGIOSCOPY;  Surgeon: Lemar Lofty., MD;  Location: [facility];  Service: Gastroenterology;  Laterality: N/A;   SPYGLASS CHOLANGIOSCOPY N/A 02/11/2022   Procedure: SPYGLASS CHOLANGIOSCOPY;  Surgeon: Lemar Lofty., MD;  Location: [facility];  Service: Gastroenterology;  Laterality: N/A;   SPYGLASS LITHOTRIPSY N/A 03/05/2021   Procedure: [id number] LITHOTRIPSY;  Surgeon: Lemar Lofty., MD;  Location: [facility];  Service: Gastroenterology;  Laterality: N/A;   SPYGLASS LITHOTRIPSY N/A 02/11/2022   Procedure: [id number] LITHOTRIPSY;  Surgeon: Lemar Lofty., MD;  Location: [facility];  Service: Gastroenterology;  Laterality: N/A;   STENT REMOVAL  02/11/2022   Procedure: STENT REMOVAL;  Surgeon: Lemar Lofty., MD;  Location: [facility];  Service: Gastroenterology;;   TONSILLECTOMY     TOTAL KNEE ARTHROPLASTY Right    Social History   Tobacco Use   Smoking status: Former    Current packs/day: 0.00    Average packs/day: 1 pack/day for 35.0 years (35.0 ttl pk-yrs)    Types: Cigarettes    Start date: 01/17/1954    Quit date: 01/17/1989    Years since quitting: 34.8   Smokeless tobacco: Never  Vaping Use   Vaping status: Never Used   Substance Use Topics   Alcohol use: Not Currently   Drug use: Never   Social History   Socioeconomic History   Marital status: Widowed    Spouse name: Not on file   Number of children: 4   Years of education: Not on file   Highest education level: Not on file  Occupational History   Occupation: Retired Lawyer  Tobacco Use   Smoking status: Former    Current packs/day: 0.00    Average packs/day: 1 pack/day for 35.0 years (35.0 ttl pk-yrs)    Types: Cigarettes    Start date: 01/17/1954    Quit date: 01/17/1989    Years since quitting: 34.8   Smokeless tobacco: Never  Vaping Use   Vaping status: Never Used  Substance and Sexual Activity   Alcohol use: Not Currently   Drug use: Never   Sexual activity: Not Currently  Other Topics Concern   Not on file  Social History Narrative   Just moved from [place]--- and moved into an apt 2.4 miles from her daughter    Social Determinants of Health   Financial Resource Strain: Low Risk  (04/02/2022)   Overall Financial Resource Strain (CARDIA)    Difficulty of Paying Living Expenses: Not hard at all  Food Insecurity: No Food Insecurity (11/03/2023)   Hunger Vital Sign    Worried About Running Out of Food in the Last Year: Never true    Ran Out of  Food in the Last Year: Never true  Transportation Needs: No Transportation Needs (11/03/2023)   PRAPARE - Administrator, Civil Service (Medical): No    Lack of Transportation (Non-Medical): No  Physical Activity: Sufficiently Active (04/02/2022)   Exercise Vital Sign    Days of Exercise per Week: 5 days    Minutes of Exercise per Session: 60 min  Stress: No Stress Concern Present (04/02/2022)   [employer] of Occupational Health - Occupational Stress Questionnaire    Feeling of Stress : Not at all  Social Connections: Moderately Isolated (04/02/2022)   Social Connection and Isolation Panel [NHANES]    Frequency of Communication with Friends and Family: More than three times a week     Frequency of Social Gatherings with Friends and Family: Three times a week    Attends Religious Services: More than 4 times per year    Active Member of Clubs or Organizations: No    Attends Banker Meetings: Never    Marital Status: Widowed  Intimate Partner Violence: Not At Risk (11/03/2023)   Humiliation, Afraid, Rape, and Kick questionnaire    Fear of Current or Ex-Partner: No    Emotionally Abused: No    Physically Abused: No    Sexually Abused: No   Family Status  Relation Name Status   Mother  Deceased   Father  Deceased   Sister  Deceased   MGM  Deceased   MGF  Deceased   PGM  Deceased   PGF  Deceased   Son  Deceased   Sister  Deceased   Sister  Deceased   Sister  Deceased   Sister  Deceased   Sister  Deceased   Sister  Deceased   Daughter  Alive   Son  Alive   Daughter  Alive  No partnership data on file   Family History  Problem Relation Age of Onset   Diabetes Mother    Heart disease Father    Lung cancer Sister    Lung cancer Son    Hypertension Son    Hypertension Daughter    Diabetes Son    Hypertension Son    Diabetes Daughter    Hypertension Daughter    Allergies  Allergen Reactions   Gramicidin Other (See Comments)    Gramicidin is an antibiotic peptide that is used to treat local infections caused by gram-positive bacteria. Unknown reaction   Sulfa Antibiotics Rash   Wound Dressing Adhesive Rash and Other (See Comments)    Some dressings break out the skin      Review of Systems  Constitutional:  Negative for fever and malaise/fatigue.  HENT:  Negative for congestion.   Eyes:  Negative for blurred vision.  Respiratory:  Negative for shortness of breath.   Cardiovascular:  Negative for chest pain, palpitations and leg swelling.  Gastrointestinal:  Negative for abdominal pain, blood in stool and nausea.  Genitourinary:  Negative for dysuria and frequency.  Musculoskeletal:  Negative for falls.  Skin:  Negative for rash.   Neurological:  Negative for dizziness, loss of consciousness and headaches.  Endo/Heme/Allergies:  Negative for environmental allergies.  Psychiatric/Behavioral:  Negative for depression. The patient is not nervous/anxious.       Objective:     BP 120/60 (BP Location: Right Arm, Patient Position: Sitting, Cuff Size: Normal)   Pulse (!) 52   Temp 97.7 F (36.5 C) (Oral)   Resp 18   Ht 5\' 4"  (1.626 m)  Wt 152 lb 3.2 oz (69 kg)   SpO2 97%   BMI 26.13 kg/m  BP Readings from Last 3 Encounters:  11/07/23 120/60  10/31/23 (!) 169/47  10/13/23 (!) 144/60   Wt Readings from Last 3 Encounters:  11/07/23 152 lb 3.2 oz (69 kg)  10/29/23 149 lb 14.6 oz (68 kg)  10/13/23 150 lb (68 kg)   SpO2 Readings from Last 3 Encounters:  11/07/23 97%  10/31/23 99%  10/13/23 94%      Physical Exam Vitals and nursing note reviewed.  Constitutional:      General: She is not in acute distress.    Appearance: Normal appearance. She is well-developed.  HENT:     Head: Normocephalic and atraumatic.  Eyes:     General: No scleral icterus.       Right eye: No discharge.        Left eye: No discharge.  Cardiovascular:     Rate and Rhythm: Normal rate and regular rhythm.     Heart sounds: No murmur heard. Pulmonary:     Effort: Pulmonary effort is normal. No respiratory distress.     Breath sounds: Normal breath sounds.  Musculoskeletal:        General: Normal range of motion.     Cervical back: Normal range of motion and neck supple.     Right lower leg: No edema.     Left lower leg: No edema.  Skin:    General: Skin is warm and dry.  Neurological:     Mental Status: She is alert and oriented to person, place, and time.  Psychiatric:        Mood and Affect: Mood normal.        Behavior: Behavior normal.        Thought Content: Thought content normal.        Judgment: Judgment normal.      No results found for any visits on 11/07/23.  Last CBC Lab Results  Component Value  Date   WBC 4.0 10/31/2023   HGB 10.2 (L) 10/31/2023   HCT 32.9 (L) 10/31/2023   MCV 89.4 10/31/2023   MCH 27.7 10/31/2023   RDW 14.7 10/31/2023   PLT 138 (L) 10/31/2023   Last metabolic panel Lab Results  Component Value Date   GLUCOSE 184 (H) 10/31/2023   NA 134 (L) 10/31/2023   K 3.7 10/31/2023   CL 107 10/31/2023   CO2 22 10/31/2023   BUN 18 10/31/2023   CREATININE 0.62 10/31/2023   GFRNONAA >60 10/31/2023   CALCIUM 8.6 (L) 10/31/2023   PHOS 2.5 11/28/2021   PROT 5.3 (L) 10/31/2023   ALBUMIN 2.4 (L) 10/31/2023   BILITOT 0.6 10/31/2023   ALKPHOS 192 (H) 10/31/2023   AST 20 10/31/2023   ALT 149 (H) 10/31/2023   ANIONGAP 5 10/31/2023   Last lipids Lab Results  Component Value Date   CHOL 134 08/04/2023   HDL 51.10 08/04/2023   LDLCALC 44 08/04/2023   LDLDIRECT 71.0 04/02/2022   TRIG 195.0 (H) 08/04/2023   CHOLHDL 3 08/04/2023   Last hemoglobin A1c Lab Results  Component Value Date   HGBA1C 7.2 (H) 08/04/2023   Last thyroid functions Lab Results  Component Value Date   TSH 3.57 09/10/2023   Last vitamin D Lab Results  Component Value Date   VD25OH 27.08 (L) 09/10/2023   Last vitamin B12 and Folate Lab Results  Component Value Date   VITAMINB12 925 (H) 09/10/2023   FOLATE >24.2  09/10/2023      The ASCVD Risk score (Arnett DK, et al., 2019) failed to calculate for the following reasons:   The 2019 ASCVD risk score is only valid for ages 38 to 53    Assessment & Plan:   Problem List Items Addressed This Visit       Unprioritized   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)   Hypertension   Relevant Orders   CBC with Differential/Platelet   Comprehensive metabolic panel   Hemoglobin A1c   Lipid panel   TSH   Hyperlipidemia associated with type 2 diabetes mellitus (HCC)   Relevant Orders   CBC with Differential/Platelet   Comprehensive metabolic panel   Lipid panel   TSH   Type 2 diabetes mellitus with diabetic polyneuropathy, without  long-term current use of insulin (HCC) - Primary    Check labs today Meds d/c in hospital       Relevant Orders   Hemoglobin A1c   Lipid panel   TSH   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 Amaryl and Glucobant were discontinued to prioritize minimizing hypoglycemia risk over strict glycemic control, considering the risks of confusion, seizures, and loss of consciousness. The benefits of stopping these medications include a reduced risk of hypoglycemia. We will order lab work to monitor glucose levels and other relevant parameters and encourage regular meals to prevent hypoglycemia. Alternative approaches include dietary modifications and close monitoring.  Hypertension   Her blood pressure is well-controlled at 120/60 mmHg following the discontinuation of Diovan due to low diastolic readings (30s-40s), with risks of low diastolic pressure including dizziness and fainting. The benefits of stopping Diovan include stabilized blood pressure. We will monitor blood pressure regularly and continue current management without Diovan, considering lifestyle modifications and monitoring as alternative approaches.  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 Previously managed with a heparin drip, she is now on Xarelto. The hematoma is not painful, and ultrasounds suggest it is blood. Surgical intervention is deemed too high risk due to her age and the hematoma's thickness, with risks including bleeding and infection. The benefits of non-intervention include avoiding these surgical risks. We will  continue monitoring the hematoma and avoid surgical intervention unless absolutely necessary, with continued monitoring as an alternative approach.  Hearing Loss   She has difficulty with hearing aids, which were initially effective but have become problematic, including issues with one hearing aid not working properly. Risks of untreated hearing loss include social isolation and cognitive decline, while the benefits of hearing aid adjustment include improved hearing and quality of life. We will follow up with an audiologist for hearing aid adjustment or replacement, considering different hearing aid models or assistive listening devices as alternative approaches.  General Health Maintenance   She received a flu shot approximately six weeks ago at [facility] and opted not to receive an additional COVID-19 vaccine but has received the pneumonia vaccine. We will confirm the flu shot administration date with [facility] and proceed with no additional COVID-19 vaccine at this  time.  Follow-up   We will follow up with a GI specialist and a pain management specialist. We will also provide a letter stating she is not able to stay alone if needed.        Return in about 6 months (around 05/06/2024), or if symptoms worsen or fail to improve.    Donato Schultz, DO

## 2023-11-08 LAB — LIPID PANEL
Cholesterol: 130 mg/dL (ref <200)
HDL: 47 mg/dL — ABNORMAL LOW (ref >=50)
LDL Cholesterol (Calc): 55 mg/dL
Non-HDL Cholesterol (Calc): 83 mg/dL (ref <130)
Total CHOL/HDL Ratio: 2.8 (calc) (ref <5.0)
Triglycerides: 214 mg/dL — ABNORMAL HIGH (ref <150)

## 2023-11-08 LAB — COMPREHENSIVE METABOLIC PANEL
AG Ratio: 1.5 (calc) (ref 1.0–2.5)
ALT: 55 U/L — ABNORMAL HIGH (ref 6–29)
AST: 21 U/L (ref 10–35)
Albumin: 3.5 g/dL — ABNORMAL LOW (ref 3.6–5.1)
Alkaline phosphatase (APISO): 261 U/L — ABNORMAL HIGH (ref 37–153)
BUN/Creatinine Ratio: 28 (calc) — ABNORMAL HIGH (ref 6–22)
BUN: 26 mg/dL — ABNORMAL HIGH (ref 7–25)
CO2: 26 mmol/L (ref 20–32)
Calcium: 9 mg/dL (ref 8.6–10.4)
Chloride: 101 mmol/L (ref 98–110)
Creat: 0.94 mg/dL (ref 0.60–0.95)
Globulin: 2.4 g/dL (ref 1.9–3.7)
Glucose, Bld: 171 mg/dL — ABNORMAL HIGH (ref 65–99)
Potassium: 4.4 mmol/L (ref 3.5–5.3)
Sodium: 135 mmol/L (ref 135–146)
Total Bilirubin: 0.5 mg/dL (ref 0.2–1.2)
Total Protein: 5.9 g/dL — ABNORMAL LOW (ref 6.1–8.1)

## 2023-11-08 LAB — CBC WITH DIFFERENTIAL/PLATELET
Absolute Lymphocytes: 1747 {cells}/uL (ref 850–3900)
Absolute Monocytes: 445 {cells}/uL (ref 200–950)
Basophils Absolute: 39 {cells}/uL (ref 0–200)
Basophils Relative: 0.5 %
Eosinophils Absolute: 101 {cells}/uL (ref 15–500)
Eosinophils Relative: 1.3 %
HCT: 36.8 % (ref 35.0–45.0)
Hemoglobin: 11.7 g/dL (ref 11.7–15.5)
MCH: 27.9 pg (ref 27.0–33.0)
MCHC: 31.8 g/dL — ABNORMAL LOW (ref 32.0–36.0)
MCV: 87.6 fL (ref 80.0–100.0)
MPV: 12.8 fL — ABNORMAL HIGH (ref 7.5–12.5)
Monocytes Relative: 5.7 %
Neutro Abs: 5468 {cells}/uL (ref 1500–7800)
Neutrophils Relative %: 70.1 %
Platelets: 252 10*3/uL (ref 140–400)
RBC: 4.2 10*6/uL (ref 3.80–5.10)
RDW: 13.8 % (ref 11.0–15.0)
Total Lymphocyte: 22.4 %
WBC: 7.8 10*3/uL (ref 3.8–10.8)

## 2023-11-08 LAB — TSH: TSH: 4 m[IU]/L (ref 0.40–4.50)

## 2023-11-08 LAB — HEMOGLOBIN A1C
Hgb A1c MFr Bld: 7.3 %{Hb} — ABNORMAL HIGH (ref <5.7)
Mean Plasma Glucose: 163 mg/dL
eAG (mmol/L): 9 mmol/L

## 2023-11-20 ENCOUNTER — Other Ambulatory Visit (HOSPITAL_COMMUNITY): Payer: Self-pay

## 2023-11-20 NOTE — Telephone Encounter (Signed)
Pharmacy Patient Advocate Encounter   Received notification from Pt Calls Messages that prior authorization for Carafate suspension is required/requested.   Insurance verification completed.   The patient is insured through [employer] .   Per test claim: The current 30 day co-pay is, $16.00.  No PA needed at this time. This test claim was processed through [facility]- copay amounts may vary at other pharmacies due to pharmacy/plan contracts, or as the patient moves through the different stages of their insurance plan.

## 2023-12-23 ENCOUNTER — Ambulatory Visit (INDEPENDENT_AMBULATORY_CARE_PROVIDER_SITE_OTHER): Payer: Medicare Other | Admitting: Gastroenterology

## 2023-12-23 ENCOUNTER — Encounter: Payer: Self-pay | Admitting: Gastroenterology

## 2023-12-23 ENCOUNTER — Other Ambulatory Visit (INDEPENDENT_AMBULATORY_CARE_PROVIDER_SITE_OTHER): Payer: Medicare Other

## 2023-12-23 VITALS — BP 140/66 | HR 62 | Ht 64.0 in | Wt 145.8 lb

## 2023-12-23 DIAGNOSIS — K8050 Calculus of bile duct without cholangitis or cholecystitis without obstruction: Secondary | ICD-10-CM

## 2023-12-23 DIAGNOSIS — K8689 Other specified diseases of pancreas: Secondary | ICD-10-CM | POA: Diagnosis not present

## 2023-12-23 DIAGNOSIS — Z9889 Other specified postprocedural states: Secondary | ICD-10-CM

## 2023-12-23 DIAGNOSIS — K259 Gastric ulcer, unspecified as acute or chronic, without hemorrhage or perforation: Secondary | ICD-10-CM | POA: Diagnosis not present

## 2023-12-23 DIAGNOSIS — D490 Neoplasm of unspecified behavior of digestive system: Secondary | ICD-10-CM

## 2023-12-23 DIAGNOSIS — D136 Benign neoplasm of pancreas: Secondary | ICD-10-CM

## 2023-12-23 DIAGNOSIS — Z8501 Personal history of malignant neoplasm of esophagus: Secondary | ICD-10-CM

## 2023-12-23 DIAGNOSIS — Z7901 Long term (current) use of anticoagulants: Secondary | ICD-10-CM | POA: Diagnosis not present

## 2023-12-23 LAB — HEPATIC FUNCTION PANEL
ALT: 15 U/L (ref 0–35)
AST: 11 U/L (ref 0–37)
Albumin: 4 g/dL (ref 3.5–5.2)
Alkaline Phosphatase: 111 U/L (ref 39–117)
Bilirubin, Direct: 0.1 mg/dL (ref 0.0–0.3)
Total Bilirubin: 0.3 mg/dL (ref 0.2–1.2)
Total Protein: 7 g/dL (ref 6.0–8.3)

## 2023-12-23 LAB — COMPREHENSIVE METABOLIC PANEL
ALT: 15 U/L (ref 0–35)
AST: 11 U/L (ref 0–37)
Albumin: 4 g/dL (ref 3.5–5.2)
Alkaline Phosphatase: 111 U/L (ref 39–117)
BUN: 25 mg/dL — ABNORMAL HIGH (ref 6–23)
CO2: 27 meq/L (ref 19–32)
Calcium: 9.6 mg/dL (ref 8.4–10.5)
Chloride: 105 meq/L (ref 96–112)
Creatinine, Ser: 0.88 mg/dL (ref 0.40–1.20)
GFR: 56.47 mL/min — ABNORMAL LOW (ref >60.00)
Glucose, Bld: 144 mg/dL — ABNORMAL HIGH (ref 70–99)
Potassium: 4.1 meq/L (ref 3.5–5.1)
Sodium: 138 meq/L (ref 135–145)
Total Bilirubin: 0.3 mg/dL (ref 0.2–1.2)
Total Protein: 7 g/dL (ref 6.0–8.3)

## 2023-12-23 LAB — CBC
HCT: 40.9 % (ref 36.0–46.0)
Hemoglobin: 13.4 g/dL (ref 12.0–15.0)
MCHC: 32.9 g/dL (ref 30.0–36.0)
MCV: 87.3 fL (ref 78.0–100.0)
Platelets: 175 10*3/uL (ref 150.0–400.0)
RBC: 4.69 Mil/uL (ref 3.87–5.11)
RDW: 15.4 % (ref 11.5–15.5)
WBC: 8.7 10*3/uL (ref 4.0–10.5)

## 2023-12-23 MED ORDER — SUCRALFATE 1 G PO TABS: 1.0000 g | ORAL_TABLET | Freq: Two times a day (BID) | ORAL | 2 refills | Status: DC

## 2023-12-23 NOTE — Progress Notes (Signed)
 GASTROENTEROLOGY OUTPATIENT CLINIC VISIT   Primary Care Provider Antonio Cyndee Jamee JONELLE, DO [address] [place] [address] [phone]  Patient Profile: Sherry Holloway is a 89 y.o. 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  The patient presents to the [facility] for an evaluation and management of problem(s) noted below:  Problem List 1. Choledocholithiasis   2. IPMN (intraductal papillary mucinous neoplasm)   3. Multiple gastric ulcers   4. History of biliary stent insertion   5. Personal history of esophageal cancer   6. Chronic anticoagulation    Discussed the use of AI scribe software for clinical note transcription with the patient, who gave verbal consent to proceed.  History of Present Illness Please see prior GI notes for full details of HPI.  Interval History The patient presents for follow-up after recent hospitalization for biliary obstruction in setting of progressive MD-IPMN leading to recurrent biliary sludge/choledocholithiasis requiring repeat ERCP and sludge removal and stenting.  She reports doing well since discharge without new symptomatology.  She has not had new labs in the last few weeks.  The patient's age and the nature of the pancreas cysts limit surgical options for MD-IPMN and patient and daughter are aware of this.  She remains on Xarelto  but can come off of this as she has in the past.  During her procedure, gastric ulcers were noted, and PPI therapy and Carafate  had been ordered, but it seems there was a prior authorization issues with the Carafate , so she never received this.  She denies pyrosis or acid indigestion or dyspeptic symptoms.  She remains on 40 mg PPI BID.   No other changes in bowel habits or blood in her stools.  No jaundice or darkened urine has been noted.   GI Review of Systems Positive as above Negative for dysphagia, odynophagia, nausea, vomiting, pain, alteration of bowel habits, melena, hematochezia Review of Systems General: Denies fevers/chills/weight loss unintentionally Cardiovascular: Denies chest pain Pulmonary: Denies shortness of breath Gastroenterological: See HPI Genitourinary: Denies darkened urine Hematological: Denies easy bruising/bleeding Dermatological: Denies jaundice Psychological: Mood is stable   Medications Current Outpatient Medications  Medication Sig Dispense Refill   bumetanide  (BUMEX ) 1 MG tablet Take 0.5-1 tablets (0.5-1 mg total) by mouth daily as needed (swelling).     CREON  36000-114000 units CPEP capsule TAKE 1 CAPSULE THREE TIMES A DAY BEFORE MEALS (Patient taking differently: Take 36,000 Units by mouth See admin instructions. Take 36,000 units by mouth before breakfast and supper/evening meal) 2 capsule 4   docusate sodium  (COLACE) 100 MG capsule Take 1 capsule (100 mg total) by mouth 2 (two) times daily. (Patient taking differently: Take 100-200 mg by mouth See admin instructions. Take 200 mg by mouth in the morning and 100 mg in the evening) 10 capsule 0   famciclovir  (FAMVIR ) 125 MG tablet TAKE 1 TABLET DAILY 90 tablet 3   glucose blood (FREESTYLE LITE) test strip CHECK BLOOD SUGAR TWICE A DAY 200 strip 12   JARDIANCE  25 MG TABS tablet TAKE 1 TABLET DAILY BEFORE BREAKFAST 90 tablet 3   latanoprost  (XALATAN ) 0.005 % ophthalmic solution Place 1 drop into both eyes at bedtime. 2.5 mL 2   linaclotide  (LINZESS ) 145 MCG CAPS capsule Take 1 capsule (145 mcg total) by mouth daily before breakfast. 90  capsule 1   mirtazapine  (REMERON ) 15 MG tablet TAKE 1 TABLET AT BEDTIME 90 tablet 3   morphine  (MS CONTIN ) 30 MG 12 hr tablet Take 30 mg by mouth every 12 (twelve) hours.     Multiple Vitamin (MULTIVITAMIN  WITH MINERALS) TABS tablet Take 1 tablet by mouth daily with supper.     omeprazole  (PRILOSEC) 40 MG capsule Take 1 capsule (40 mg total) by mouth in the morning and at bedtime. 180 capsule 0   senna (SENOKOT) 8.6 MG TABS tablet Take 1 tablet (8.6 mg total) by mouth daily. 120 tablet 0   senna-docusate (SENNA S) 8.6-50 MG tablet Take 1 tablet by mouth See admin instructions. Take 1 tablet by mouth in the morning and evening     sucralfate  (CARAFATE ) 1 g tablet Take 1 tablet (1 g total) by mouth 2 (two) times daily. 60 tablet 2   TYLENOL  500 MG tablet Take 500-1,000 mg by mouth every 6 (six) hours as needed for mild pain (pain score 1-3) or headache.     XARELTO  20 MG TABS tablet TAKE 1 TABLET DAILY WITH SUPPER, START ONLY AFTER THE STARTER PACK HAS BEEN COMPLETED (Patient taking differently: Take 20 mg by mouth daily with supper.) 90 tablet 3   No current facility-administered medications for this visit.    Allergies Allergies  Allergen Reactions   Gramicidin Other (See Comments)    Gramicidin is an antibiotic peptide that is used to treat local infections caused by gram-positive bacteria. Unknown reaction   Sulfa Antibiotics Rash   Wound Dressing Adhesive Rash and Other (See Comments)    Some dressings break out the skin    Histories Past Medical History:  Diagnosis Date   Arthritis    Choledocholithiasis    Diabetes mellitus without complication (HCC)    Diverticulitis    Esophageal cancer (HCC) dx'd 2020   GERD (gastroesophageal reflux disease)    Hyperlipidemia    Hypertension    OSA (obstructive sleep apnea)    Urine incontinence    Uterine cancer (HCC)    Past Surgical History:  Procedure Laterality Date   ABDOMINAL HYSTERECTOMY  1972   APPENDECTOMY     BALLOON DILATION N/A 10/29/2023   Procedure: Billiary BALLOON DILATION;  Surgeon: Wilhelmenia Aloha Raddle., MD;  Location: [facility];  Service: Gastroenterology;  Laterality: N/A;   BILIARY DILATION  03/05/2021    Procedure: BILIARY DILATION;  Surgeon: Wilhelmenia Aloha Raddle., MD;  Location: [facility];  Service: Gastroenterology;;   BILIARY DILATION  11/27/2021   Procedure: BILIARY DILATION;  Surgeon: Teressa Toribio SQUIBB, MD;  Location: [facility];  Service: Endoscopy;;   BILIARY DILATION  02/11/2022   Procedure: BILIARY DILATION;  Surgeon: Wilhelmenia Aloha Raddle., MD;  Location: [facility];  Service: Gastroenterology;;   BILIARY STENT PLACEMENT  11/27/2021   Procedure: BILIARY STENT PLACEMENT;  Surgeon: Teressa Toribio SQUIBB, MD;  Location: [facility] ENDOSCOPY;  Service: Endoscopy;;   BILIARY STENT PLACEMENT N/A 10/29/2023   Procedure: BILIARY STENT PLACEMENT;  Surgeon: Wilhelmenia Aloha Raddle., MD;  Location: [facility];  Service: Gastroenterology;  Laterality: N/A;   BIOPSY  03/05/2021   Procedure: BIOPSY;  Surgeon: Wilhelmenia Aloha Raddle., MD;  Location: [facility];  Service: Gastroenterology;;   BIOPSY  11/27/2021   Procedure: BIOPSY;  Surgeon: Teressa Toribio SQUIBB, MD;  Location[facility];  Service: Endoscopy;;   BIOPSY  10/29/2023   Procedure: BIOPSY;  Surgeon: Wilhelmenia Aloha Raddle., MD;  Location: [facility];  Service: Gastroenterology;;   CHOLECYSTECTOMY  ENDOSCOPIC RETROGRADE CHOLANGIOPANCREATOGRAPHY (ERCP) WITH PROPOFOL  N/A 03/05/2021   Procedure: ENDOSCOPIC RETROGRADE CHOLANGIOPANCREATOGRAPHY (ERCP) WITH PROPOFOL ;  Surgeon: Wilhelmenia Aloha Raddle., MD;  Location: [facility];  Service: Gastroenterology;  Laterality: N/A;   ENDOSCOPIC RETROGRADE CHOLANGIOPANCREATOGRAPHY (ERCP) WITH PROPOFOL  N/A 11/27/2021   Procedure: ENDOSCOPIC RETROGRADE CHOLANGIOPANCREATOGRAPHY (ERCP) WITH PROPOFOL ;  Surgeon: Teressa Toribio SQUIBB, MD;  Location: [facility] ENDOSCOPY;  Service: Endoscopy;  Laterality: N/A;  possible EGD prior   ENDOSCOPIC RETROGRADE CHOLANGIOPANCREATOGRAPHY (ERCP) WITH PROPOFOL  N/A 02/11/2022   Procedure: ENDOSCOPIC RETROGRADE CHOLANGIOPANCREATOGRAPHY (ERCP) WITH PROPOFOL ;  Surgeon: Wilhelmenia  Aloha Raddle., MD;  Location: [facility];  Service: Gastroenterology;  Laterality: N/A;   ERCP N/A 10/29/2023   Procedure: ENDOSCOPIC RETROGRADE CHOLANGIOPANCREATOGRAPHY (ERCP);  Surgeon: Wilhelmenia Aloha Raddle., MD;  Location: [facility];  Service: Gastroenterology;  Laterality: N/A;   ESOPHAGOGASTRODUODENOSCOPY N/A 11/27/2021   Procedure: ESOPHAGOGASTRODUODENOSCOPY (EGD);  Surgeon: Teressa Toribio SQUIBB, MD;  Location: [facility] ENDOSCOPY;  Service: Endoscopy;  Laterality: N/A;   ESOPHAGOGASTRODUODENOSCOPY N/A 10/29/2023   Procedure: ESOPHAGOGASTRODUODENOSCOPY (EGD);  Surgeon: Wilhelmenia Aloha Raddle., MD;  Location: [facility];  Service: Gastroenterology;  Laterality: N/A;   ESOPHAGOGASTRODUODENOSCOPY (EGD) WITH PROPOFOL  N/A 03/05/2021   Procedure: ESOPHAGOGASTRODUODENOSCOPY (EGD) WITH PROPOFOL ;  Surgeon: Wilhelmenia Aloha Raddle., MD;  Location: [facility];  Service: Gastroenterology;  Laterality: N/A;   EUS N/A 03/05/2021   Procedure: UPPER ENDOSCOPIC ULTRASOUND (EUS) RADIAL;  Surgeon: Wilhelmenia Aloha Raddle., MD;  Location: [facility];  Service: Gastroenterology;  Laterality: N/A;   LITHOTRIPSY  02/11/2022   Procedure: LITHOTRIPSY;  Surgeon: Wilhelmenia Aloha Raddle., MD;  Location: [facility];  Service: Gastroenterology;;   LITHOTRIPSY  10/29/2023   Procedure: LITHOTRIPSY;  Surgeon: Wilhelmenia Aloha Raddle., MD;  Location: [facility];  Service: Gastroenterology;;   LUMBAR LAMINECTOMY     REMOVAL OF STONES  03/05/2021   Procedure: REMOVAL OF STONES;  Surgeon: Wilhelmenia Aloha Raddle., MD;  Location: [facility];  Service: Gastroenterology;;   REMOVAL OF STONES  11/27/2021   Procedure: REMOVAL OF STONES;  Surgeon: Teressa Toribio SQUIBB, MD;  Location: [facility] ENDOSCOPY;  Service: Endoscopy;;   REMOVAL OF STONES  02/11/2022   Procedure: REMOVAL OF STONES;  Surgeon: Wilhelmenia Aloha Raddle., MD;  Location: [facility];  Service: Gastroenterology;;   REMOVAL OF STONES  10/29/2023   Procedure: REMOVAL OF  STONES;  Surgeon: Wilhelmenia Aloha Raddle., MD;  Location: [facility];  Service: Gastroenterology;;   SPINAL CORD STIMULATOR IMPLANT     Not active any more.   SPYGLASS CHOLANGIOSCOPY N/A 03/05/2021   Procedure: SPYGLASS CHOLANGIOSCOPY;  Surgeon: Wilhelmenia Aloha Raddle., MD;  Location: [facility];  Service: Gastroenterology;  Laterality: N/A;   SPYGLASS CHOLANGIOSCOPY N/A 02/11/2022   Procedure: SPYGLASS CHOLANGIOSCOPY;  Surgeon: Wilhelmenia Aloha Raddle., MD;  Location: [facility];  Service: Gastroenterology;  Laterality: N/A;   SPYGLASS LITHOTRIPSY N/A 03/05/2021   Procedure: DEBHOJDD LITHOTRIPSY;  Surgeon: Wilhelmenia Aloha Raddle., MD;  Location: [facility];  Service: Gastroenterology;  Laterality: N/A;   SPYGLASS LITHOTRIPSY N/A 02/11/2022   Procedure: DEBHOJDD LITHOTRIPSY;  Surgeon: Wilhelmenia Aloha Raddle., MD;  Location: [facility];  Service: Gastroenterology;  Laterality: N/A;   STENT REMOVAL  02/11/2022   Procedure: STENT REMOVAL;  Surgeon: Wilhelmenia Aloha Raddle., MD;  Location: [facility];  Service: Gastroenterology;;   TONSILLECTOMY     TOTAL KNEE ARTHROPLASTY Right    Social History   Socioeconomic History   Marital status: Widowed    Spouse name: Not on file   Number of children: 4   Years of education: Not on file   Highest education  level: Not on file  Occupational History   Occupation: Retired LAWYER  Tobacco Use   Smoking status: Former    Current packs/day: 0.00    Average packs/day: 1 pack/day for 35.0 years (35.0 ttl pk-yrs)    Types: Cigarettes    Start date: 01/17/1954    Quit date: 01/17/1989    Years since quitting: 34.9   Smokeless tobacco: Never  Vaping Use   Vaping status: Never Used  Substance and Sexual Activity   Alcohol  use: Not Currently   Drug use: Never   Sexual activity: Not Currently  Other Topics Concern   Not on file  Social History Narrative   Just moved from [place]--- and moved into an apt 2.4 miles from her daughter    Social Drivers  of Health   Financial Resource Strain: Low Risk  (04/02/2022)   Overall Financial Resource Strain (CARDIA)    Difficulty of Paying Living Expenses: Not hard at all  Food Insecurity: No Food Insecurity (11/03/2023)   Hunger Vital Sign    Worried About Running Out of Food in the Last Year: Never true    Ran Out of Food in the Last Year: Never true  Transportation Needs: No Transportation Needs (11/03/2023)   PRAPARE - Administrator, Civil Service (Medical): No    Lack of Transportation (Non-Medical): No  Physical Activity: Sufficiently Active (04/02/2022)   Exercise Vital Sign    Days of Exercise per Week: 5 days    Minutes of Exercise per Session: 60 min  Stress: No Stress Concern Present (04/02/2022)   [employer] of Occupational Health - Occupational Stress Questionnaire    Feeling of Stress : Not at all  Social Connections: Moderately Isolated (04/02/2022)   Social Connection and Isolation Panel [NHANES]    Frequency of Communication with Friends and Family: More than three times a week    Frequency of Social Gatherings with Friends and Family: Three times a week    Attends Religious Services: More than 4 times per year    Active Member of Clubs or Organizations: No    Attends Banker Meetings: Never    Marital Status: Widowed  Intimate Partner Violence: Not At Risk (11/03/2023)   Humiliation, Afraid, Rape, and Kick questionnaire    Fear of Current or Ex-Partner: No    Emotionally Abused: No    Physically Abused: No    Sexually Abused: No   Family History  Problem Relation Age of Onset   Diabetes Mother    Heart disease Father    Lung cancer Sister    Hypertension Daughter    Diabetes Daughter    Hypertension Daughter    Lung cancer Son    Hypertension Son    Diabetes Son    Hypertension Son    Colon cancer Neg Hx    Esophageal cancer Neg Hx    Inflammatory bowel disease Neg Hx    Liver disease Neg Hx    Pancreatic cancer Neg Hx     Rectal cancer Neg Hx    Stomach cancer Neg Hx    I have reviewed her medical, social, and family history in detail and updated the electronic medical record as necessary.    PHYSICAL EXAMINATION  BP (!) 140/66   Pulse 62   Ht 5' 4 (1.626 m)   Wt 145 lb 12.8 oz (66.1 kg)   BMI 25.03 kg/m  Wt Readings from Last 3 Encounters:  12/23/23 145 lb 12.8 oz (66.1 kg)  11/07/23 152 lb 3.2 oz (69 kg)  10/29/23 149 lb 14.6 oz (68 kg)  GEN: NAD, appears stated age, doesn't appear chronically ill PSYCH: Cooperative, without pressured speech EYE: Conjunctivae pink, sclerae anicteric ENT: MMM CV: Nontachycardic RESP: No audible wheezing GI: NABS, soft, NT/ND, without rebound or guarding MSK/EXT: No significant lower extremity edema SKIN: No jaundice NEURO:  Alert & Oriented x 3, no focal deficits   REVIEW OF DATA  I reviewed the following data at the time of this encounter:  GI Procedures and Studies  November 2020 for ERCP - Congested, texture changed mucosa in the esophagus @ site of previous SCC - biopsied. - No other gross lesions in the entire esophagus. - Z-line irregular, 39 cm from the incisors. - Gastritis and gastric ulcers - biopsied for HP evaluation. - No gross lesions in the duodenal bulb and in the sweep. - The major papilla was located entirely within a diverticulum. - The major papilla appeared patulous with a large mucous ball (4-5 cm in size) that took 10 minutes to clear. - The major pancreatic orifice appeared congested - biopsied to rule out dysplasia. - Prior biliary sphincterotomy appeared open. - Multiple filling defects consistent with stones and sludge was seen on the cholangiogram. - The entire biliary tree was severely dilated. - Choledocholithiasis was found. Partial removal was accomplished with biliary balloon sphincteroplasty, balloon sweeping, mechanical lithotripsy; 2 biliary stents placed for further decompression.  Laboratory Studies  Reviewed those in  epic  Imaging Studies  November 2024 CT abdomen pancreas protocol IMPRESSION: 1. Status post cholecystectomy. Severe intra and extrahepatic biliary ductal dilatation. The common bile duct, common hepatic duct, and remnant cystic duct are packed with faintly calcified gallstones, measuring up to 1.5 cm. 2. Significant interval enlargement of a cystic lesion within the pancreatic head extending to the ampulla, measuring 4.1 x 3.3 cm. Severe pancreatic atrophy with diffuse dilatation of the pancreatic duct measuring up to 1.2 cm in caliber. 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 Consider EUS/FNA if not already performed and if clinically appropriate given advanced patient age. 3. Large burden of stool. 4. Coronary artery disease.   ASSESSMENT  Ms. Widener is a 89 y.o. 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   The patient is seen today for evaluation and management of:  1. Choledocholithiasis   2. IPMN (intraductal papillary mucinous neoplasm)   3. Multiple gastric ulcers   4. History of biliary stent insertion   5. Personal history of esophageal cancer   6. Chronic anticoagulation    The patient is hemodynamically and clinically stable at this time.  Unfortunately, as a result of the patient's anatomy and the development of this main duct IPMN, she is at risk of recurrent issues of biliary obstruction as a result of the significant mucous plugging that is occurring from the mucus production of the main duct IPMN sitting within the ampullary diverticulum in the enlarged biliary tract opening.  Will going to do everything we can to optimize her bile duct flow and  remove choledocholithiasis as able.  Will plan to proceed with EHL interventions on our next procedure that we will schedule.  Hopefully we can clear everything out with just 1 more procedure though she may require another.  I  suspect the biliary stenting has been effective as she looks to be doing well at this point.  Unfortunately as she has no surgical options due to her age of Whipple procedure being possible, I am not sure that stenting itself will be ideal and will likely just pose a risk in the long-term if left in place for infection complications or stent migration.  Time will tell.  We could also consider the role of ursodiol to maybe decrease her risk of redevelopment of stones, but these are biliary sludge stones rather than calcific stones, so I am not sure if it will be entirely effective.  Something to consider.  Will get approval for hold of her blood thinner from her primary care provider and move forward with scheduling this in the next few months.  We wish her an early happy 89th birthday.  The risks of an ERCP were discussed at length, including but not limited to the risk of perforation, bleeding, abdominal pain, post-ERCP pancreatitis (while usually mild can be severe and even life threatening).  The risks and benefits of endoscopic evaluation were discussed with the patient; these include but are not limited to the risk of perforation, infection, bleeding, missed lesions, lack of diagnosis, severe illness requiring hospitalization, as well as anesthesia and sedation related illnesses.  The patient and/or family is agreeable to proceed.  All patient questions were answered to the best of my ability, and the patient agrees to the aforementioned plan of action with follow-up as indicated.   PLAN  Laboratories as outlined below Proceed with scheduling EGD/ERCP Continue twice daily PPI 40 mg Initiate Carafate  1 g twice daily for further healing of gastric ulcers Follow-up CA 19-9 lab  level Consider ursodiol in future May continue current Creon  that she has been on   Orders Placed This Encounter  Procedures   Procedural/ Surgical Case Request: ENDOSCOPIC RETROGRADE CHOLANGIOPANCREATOGRAPHY (ERCP) WITH PROPOFOL    CBC   Comp Met (CMET)   Cancer antigen 19-9   Ambulatory referral to Gastroenterology    New Prescriptions   SUCRALFATE  (CARAFATE ) 1 G TABLET    Take 1 tablet (1 g total) by mouth 2 (two) times daily.   Modified Medications   No medications on file    Planned Follow Up No follow-ups on file.   Total Time in Face-to-Face and in Coordination of Care for patient including independent/personal interpretation/review of prior testing, medical history, examination, medication adjustment, communicating results with the patient directly, and documentation within the EHR is 30 minutes.SABRA Aloha Finner, MD [facility] Advanced [facility]oscopy Office # [phone]

## 2023-12-23 NOTE — Patient Instructions (Signed)
 Your provider has requested that you go to the basement level for lab work before leaving today. Press B on the elevator. The lab is located at the first door on the left as you exit the elevator.  You have been scheduled for an endoscopy. Please follow written instructions given to you at your visit today.  If you use inhalers (even only as needed), please bring them with you on the day of your procedure.  If you take any of the following medications, they will need to be adjusted prior to your procedure:   DO NOT TAKE 7 DAYS PRIOR TO TEST- Trulicity (dulaglutide) Ozempic, Wegovy (semaglutide) Mounjaro (tirzepatide) Bydureon Bcise (exanatide extended release)  DO NOT TAKE 1 DAY PRIOR TO YOUR TEST Rybelsus (semaglutide) Adlyxin (lixisenatide) Victoza (liraglutide) Byetta (exanatide) ___________________________________________________________________________    We have sent the following medications to your pharmacy for you to pick up at your convenience: Carafate    Due to recent changes in healthcare laws, you may see the results of your imaging and laboratory studies on MyChart before your provider has had a chance to review them.  We understand that in some cases there may be results that are confusing or concerning to you. Not all laboratory results come back in the same time frame and the provider may be waiting for multiple results in order to interpret others.  Please give us  48 hours in order for your provider to thoroughly review all the results before contacting the office for clarification of your results.   Thank you for choosing me and [facility].  Dr. Wilhelmenia

## 2023-12-24 ENCOUNTER — Encounter: Payer: Self-pay | Admitting: Gastroenterology

## 2023-12-24 DIAGNOSIS — K259 Gastric ulcer, unspecified as acute or chronic, without hemorrhage or perforation: Secondary | ICD-10-CM | POA: Insufficient documentation

## 2023-12-24 DIAGNOSIS — Z7901 Long term (current) use of anticoagulants: Secondary | ICD-10-CM | POA: Insufficient documentation

## 2023-12-24 DIAGNOSIS — Z9889 Other specified postprocedural states: Secondary | ICD-10-CM | POA: Insufficient documentation

## 2023-12-24 LAB — CANCER ANTIGEN 19-9: CA 19-9: 15 U/mL (ref <34)

## 2024-01-07 ENCOUNTER — Other Ambulatory Visit: Payer: Self-pay | Admitting: Family Medicine

## 2024-01-07 ENCOUNTER — Telehealth: Payer: Self-pay | Admitting: Gastroenterology

## 2024-01-07 DIAGNOSIS — K5901 Slow transit constipation: Secondary | ICD-10-CM

## 2024-01-07 NOTE — Telephone Encounter (Signed)
Patient daughter inquiring if patient can have ERCP done sooner due to patient experiencing lower abdominal pain in the last four days. States the patient pain starts around dinner time. Please advise.   Thank you

## 2024-01-07 NOTE — Telephone Encounter (Signed)
I spoke with the pt daughter and made her aware that there are no sooner appts at this time.  She will take the pt to the ED if the pain is too much for her to handle.  The pt has been advised of the information and verbalized understanding.

## 2024-01-13 ENCOUNTER — Emergency Department (HOSPITAL_COMMUNITY): Payer: Medicare Other

## 2024-01-13 ENCOUNTER — Emergency Department (HOSPITAL_COMMUNITY)
Admission: EM | Admit: 2024-01-13 | Discharge: 2024-01-13 | Disposition: A | Discharge status: Home / Self Care | Payer: Medicare Other | Attending: Emergency Medicine | Admitting: Emergency Medicine

## 2024-01-13 DIAGNOSIS — D490 Neoplasm of unspecified behavior of digestive system: Secondary | ICD-10-CM

## 2024-01-13 DIAGNOSIS — K8050 Calculus of bile duct without cholangitis or cholecystitis without obstruction: Secondary | ICD-10-CM

## 2024-01-13 DIAGNOSIS — R1011 Right upper quadrant pain: Secondary | ICD-10-CM | POA: Insufficient documentation

## 2024-01-13 DIAGNOSIS — K5900 Constipation, unspecified: Secondary | ICD-10-CM | POA: Diagnosis not present

## 2024-01-13 DIAGNOSIS — R630 Anorexia: Secondary | ICD-10-CM | POA: Diagnosis not present

## 2024-01-13 DIAGNOSIS — Z7901 Long term (current) use of anticoagulants: Secondary | ICD-10-CM | POA: Diagnosis not present

## 2024-01-13 DIAGNOSIS — R112 Nausea with vomiting, unspecified: Secondary | ICD-10-CM | POA: Diagnosis present

## 2024-01-13 DIAGNOSIS — G8929 Other chronic pain: Secondary | ICD-10-CM | POA: Diagnosis present

## 2024-01-13 LAB — COMPREHENSIVE METABOLIC PANEL
ALT: 17 U/L (ref 0–44)
AST: 15 U/L (ref 15–41)
Albumin: 3.2 g/dL — ABNORMAL LOW (ref 3.5–5.0)
Alkaline Phosphatase: 88 U/L (ref 38–126)
Anion gap: 10 (ref 5–15)
BUN: 21 mg/dL (ref 8–23)
CO2: 26 mmol/L (ref 22–32)
Calcium: 9.4 mg/dL (ref 8.9–10.3)
Chloride: 100 mmol/L (ref 98–111)
Creatinine, Ser: 0.73 mg/dL (ref 0.44–1.00)
GFR, Estimated: >60 mL/min (ref >60)
Glucose, Bld: 157 mg/dL — ABNORMAL HIGH (ref 70–99)
Potassium: 3.4 mmol/L — ABNORMAL LOW (ref 3.5–5.1)
Sodium: 136 mmol/L (ref 135–145)
Total Bilirubin: 0.5 mg/dL (ref 0.0–1.2)
Total Protein: 6.3 g/dL — ABNORMAL LOW (ref 6.5–8.1)

## 2024-01-13 LAB — URINALYSIS, ROUTINE W REFLEX MICROSCOPIC
Bacteria, UA: NONE SEEN
Bilirubin Urine: NEGATIVE
Glucose, UA: >=500 mg/dL — AB
Hgb urine dipstick: NEGATIVE
Ketones, ur: 5 mg/dL — AB
Nitrite: NEGATIVE
Protein, ur: NEGATIVE mg/dL
Specific Gravity, Urine: 1.026 (ref 1.005–1.030)
pH: 5 (ref 5.0–8.0)

## 2024-01-13 LAB — CBC
HCT: 38.2 % (ref 36.0–46.0)
Hemoglobin: 12 g/dL (ref 12.0–15.0)
MCH: 27.6 pg (ref 26.0–34.0)
MCHC: 31.4 g/dL (ref 30.0–36.0)
MCV: 88 fL (ref 80.0–100.0)
Platelets: 207 10*3/uL (ref 150–400)
RBC: 4.34 MIL/uL (ref 3.87–5.11)
RDW: 14.2 % (ref 11.5–15.5)
WBC: 11.7 10*3/uL — ABNORMAL HIGH (ref 4.0–10.5)
nRBC: 0 % (ref 0.0–0.2)

## 2024-01-13 LAB — I-STAT CG4 LACTIC ACID, ED: Lactic Acid, Venous: 0.8 mmol/L (ref 0.5–1.9)

## 2024-01-13 LAB — LIPASE, BLOOD: Lipase: 20 U/L (ref 11–51)

## 2024-01-13 MED ORDER — ONDANSETRON HCL 4 MG/2ML IJ SOLN
4.0000 mg | Freq: Once | INTRAMUSCULAR | Status: AC
  Administered 2024-01-13: 4 mg via INTRAVENOUS
  Filled 2024-01-13: qty 2

## 2024-01-13 MED ORDER — ONDANSETRON 4 MG PO TBDP: 4.0000 mg | ORAL_TABLET | Freq: Three times a day (TID) | ORAL | 0 refills | Status: DC | PRN

## 2024-01-13 MED ORDER — SODIUM CHLORIDE 0.9 % IV BOLUS
1000.0000 mL | Freq: Once | INTRAVENOUS | Status: AC
Start: 2024-01-13 — End: 2024-01-13
  Administered 2024-01-13: 1000 mL via INTRAVENOUS

## 2024-01-13 MED ORDER — IOHEXOL 300 MG/ML  SOLN
100.0000 mL | Freq: Once | INTRAMUSCULAR | Status: AC | PRN
  Administered 2024-01-13: 100 mL via INTRAVENOUS

## 2024-01-13 NOTE — ED Provider Notes (Signed)
[place] EMERGENCY DEPARTMENT AT [facility] Provider Note   CSN: [id number] Arrival date & time: 01/13/24  9629     History  Chief Complaint  Patient presents with   Abdominal Pain    Sherry Holloway is a 89 y.o. female.  She is brought in by her daughter for 3 to 4 days of increased right upper quadrant pain nausea poor appetite 1 episode of vomiting.  She has known history of liver disease cystic pancreatic neoplasm and has 2 plastic stents in.  Follows with Dr. Meridee Score and is post to have the stents replaced in March.  Daughter feels like the stents may be occluded, as this has happened before and patient looks more jaundiced again.  No fevers or chills.  No chest pain or shortness of breath.  Chronic back pain.  Worsening right upper quadrant pain.  Has been mostly taking liquids and eating fruit.  Chronic constipation.  On anticoagulation  The history is provided by the patient and a relative.  Abdominal Pain Pain location:  RUQ Pain quality: aching   Pain severity:  Moderate Onset quality:  Gradual Duration:  5 days Timing:  Constant Progression:  Worsening Chronicity:  Recurrent Associated symptoms: anorexia, constipation, nausea and vomiting   Associated symptoms: no chest pain, no cough, no diarrhea, no dysuria, no fever and no hematemesis        Home Medications Prior to Admission medications   Medication Sig Start Date End Date Taking? Authorizing Provider  bumetanide (BUMEX) 1 MG tablet Take 0.5-1 tablets (0.5-1 mg total) by mouth daily as needed (swelling). 10/31/23   Rolly Salter, MD  CREON [phone] units CPEP capsule TAKE 1 CAPSULE THREE TIMES A DAY BEFORE MEALS Patient taking differently: Take 36,000 Units by mouth See admin instructions. Take 36,000 units by mouth before breakfast and supper/evening meal 05/05/23   Zola Button, Grayling Congress, DO  docusate sodium (COLACE) 100 MG capsule Take 1 capsule (100 mg total) by mouth 2 (two) times  daily. Patient taking differently: Take 100-200 mg by mouth See admin instructions. Take 200 mg by mouth in the morning and 100 mg in the evening 03/18/21   Osvaldo Shipper, MD  famciclovir [facility]) 125 MG tablet TAKE 1 TABLET DAILY 03/31/23   Zola Button, Myrene Buddy R, DO  glucose blood (FREESTYLE LITE) test strip CHECK BLOOD SUGAR TWICE A DAY 11/20/22   Zola Button, Grayling Congress, DO  JARDIANCE 25 MG TABS tablet TAKE 1 TABLET DAILY BEFORE BREAKFAST 02/04/23   Zola Button, Yvonne R, DO  latanoprost (XALATAN) 0.005 % ophthalmic solution Place 1 drop into both eyes at bedtime. 08/24/21   Donato Schultz, DO  LINZESS 145 MCG CAPS capsule TAKE 1 CAPSULE DAILY BEFORE BREAKFAST 01/08/24   Zola Button, Myrene Buddy R, DO  mirtazapine (REMERON) 15 MG tablet TAKE 1 TABLET AT BEDTIME 04/16/23   Zola Button, Grayling Congress, DO  morphine (MS CONTIN) 30 MG 12 hr tablet Take 30 mg by mouth every 12 (twelve) hours. 01/21/22   [provider]  Multiple Vitamin (MULTIVITAMIN WITH MINERALS) TABS tablet Take 1 tablet by mouth daily with supper.    [provider]  omeprazole (PRILOSEC) 40 MG capsule Take 1 capsule (40 mg total) by mouth in the morning and at bedtime. 11/03/23 02/01/24  Mansouraty, Netty Starring., MD  senna (SENOKOT) 8.6 MG TABS tablet Take 1 tablet (8.6 mg total) by mouth daily. 11/29/21   Regalado, Belkys A, MD  senna-docusate (SENNA S) 8.6-50 MG  tablet Take 1 tablet by mouth See admin instructions. Take 1 tablet by mouth in the morning and evening    [provider]  sucralfate (CARAFATE) 1 g tablet Take 1 tablet (1 g total) by mouth 2 (two) times daily. 12/23/23   Mansouraty, Netty Starring., MD  TYLENOL 500 MG tablet Take 500-1,000 mg by mouth every 6 (six) hours as needed for mild pain (pain score 1-3) or headache.    [provider]  XARELTO 20 MG TABS tablet TAKE 1 TABLET DAILY WITH SUPPER, START ONLY AFTER THE STARTER PACK HAS BEEN COMPLETED Patient taking differently: Take 20 mg by mouth daily  with supper. 10/23/23   Donato Schultz, DO      Allergies    Gramicidin, Sulfa antibiotics, and Wound dressing adhesive    Review of Systems   Review of Systems  Constitutional:  Negative for fever.  Respiratory:  Negative for cough.   Cardiovascular:  Negative for chest pain.  Gastrointestinal:  Positive for abdominal pain, anorexia, constipation, nausea and vomiting. Negative for diarrhea and hematemesis.  Genitourinary:  Negative for dysuria.  Skin:  Positive for color change.    Physical Exam Updated Vital Signs Pulse 64   Temp 98 F (36.7 C) (Oral)   Resp 20   SpO2 96%  Physical Exam Vitals and nursing note reviewed.  Constitutional:      General: She is not in acute distress.    Appearance: She is well-developed.  HENT:     Head: Normocephalic and atraumatic.  Eyes:     Conjunctiva/sclera: Conjunctivae normal.  Cardiovascular:     Rate and Rhythm: Normal rate and regular rhythm.     Heart sounds: No murmur heard. Pulmonary:     Effort: Pulmonary effort is normal. No respiratory distress.     Breath sounds: Normal breath sounds.  Abdominal:     Palpations: Abdomen is soft.     Tenderness: There is abdominal tenderness in the right upper quadrant. There is no guarding or rebound.  Musculoskeletal:        General: No swelling.     Cervical back: Neck supple.  Skin:    General: Skin is warm and dry.     Capillary Refill: Capillary refill takes less than 2 seconds.  Neurological:     General: No focal deficit present.     Mental Status: She is alert.     ED Results / Procedures / Treatments   Labs (all labs ordered are listed, but only abnormal results are displayed) Labs Reviewed  COMPREHENSIVE METABOLIC PANEL - Abnormal; Notable for the following components:      Result Value   Potassium 3.4 (*)    Glucose, Bld 157 (*)    Total Protein 6.3 (*)    Albumin 3.2 (*)    All other components within normal limits  CBC - Abnormal; Notable for the  following components:   WBC 11.7 (*)    All other components within normal limits  URINALYSIS, ROUTINE W REFLEX MICROSCOPIC - Abnormal; Notable for the following components:   APPearance HAZY (*)    Glucose, UA >=500 (*)    Ketones, ur 5 (*)    Leukocytes,Ua MODERATE (*)    All other components within normal limits  CULTURE, BLOOD (ROUTINE X 2)  CULTURE, BLOOD (ROUTINE X 2)  LIPASE, BLOOD  I-STAT CG4 LACTIC ACID, ED    EKG None  Radiology CT ABDOMEN PELVIS W CONTRAST Result Date: 01/13/2024 CLINICAL DATA:  Right  upper quadrant pain.  Biliary stents. EXAM: CT ABDOMEN AND PELVIS WITH CONTRAST TECHNIQUE: Multidetector CT imaging of the abdomen and pelvis was performed using the standard protocol following bolus administration of intravenous contrast. RADIATION DOSE REDUCTION: This exam was performed according to the departmental dose-optimization program which includes automated exposure control, adjustment of the mA and/or kV according to patient size and/or use of iterative reconstruction technique. CONTRAST:  OMNIPAQUE IOHEXOL 300 MG/ML  SOLN COMPARISON:  10/27/2023 FINDINGS: Lower chest: Focal opacity along the medial right lower lobe on image 17/6 has minimally changed since 11/25/2021. 4 mm ground-glass nodular opacity in the right lower lobe on image 7/6 is stable since 03/15/2021. No large pleural effusions. Hepatobiliary: There are 2 nonmetallic biliary stents extends down the common bile duct into the duodenum. The left intrahepatic bile ducts are dilated and gas-filled. There is no significant biliary dilatation in the right hepatic lobe. Common bile duct appears to be dilated containing fluid and gas. Limited evaluation for bile duct stones. Portal venous system is patent. Evidence for focal fat in the liver near the falciform ligament. Pancreas: Diffuse dilatation of the main pancreatic duct. No acute pancreatic inflammation. Pancreas is atrophic. Spleen: Normal in size without  focal abnormality. Adrenals/Urinary Tract: Adrenal glands are within normal limits. No suspicious renal lesions. No hydronephrosis. Normal appearance of the urinary bladder. Stomach/Bowel: Normal appearance of the stomach. The duodenum is distended with fluid at the location of the biliary stents. No small bowel dilatation. Rectum is distended with stool. Colonic diverticulosis. No acute bowel inflammation. Vascular/Lymphatic: Abdominal aorta is heavily calcified without aneurysm. No calcifications near the origin of the SMA. No significant lymph node enlargement in the abdomen or pelvis. Reproductive: Uterus appears to be surgically absent. No evidence for an adnexal mass. Limited evaluation of the pelvic structures due to artifact from the spinal neurostimulator generator on the right side of the abdomen. Other: Negative for free fluid. Negative for free air. Small periumbilical or hernia or hernias containing fat. Musculoskeletal: Left hip arthroplasty is located. There is a neurostimulator generator on the right side of the abdomen with leads going into the epidural space at L1-L2. Anterolisthesis of L4-L5 and L5-S1 appears to be secondary to facet arthropathy. Disc space narrowing at L5-S1. IMPRESSION: 1. Pneumobilia with biliary stents. These are expected findings and no evidence for an acute biliary or hepatic abnormality. Limited evaluation for bile duct stones. 2. Stable diffuse dilatation of the main pancreatic duct. 3. Colonic diverticulosis without acute inflammation. 4. Aortic Atherosclerosis (ICD10-I70.0). Electronically Signed   By: Richarda Overlie M.D.   On: 01/13/2024 12:09    Procedures Procedures    Medications Ordered in ED Medications  sodium chloride 0.9 % bolus 1,000 mL (0 mLs Intravenous Stopped 01/13/24 1105)  ondansetron (ZOFRAN) injection 4 mg (4 mg Intravenous Given 01/13/24 0816)  iohexol (OMNIPAQUE) 300 MG/ML solution 100 mL (100 mLs Intravenous Contrast Given 01/13/24 1106)    ED  Course/ Medical Decision Making/ A&P Clinical Course as of 01/13/24 1655  Tue Jan 13, 2024  1020 Patient was seen by [facility] Wilmon Pali.  She reviewed this with her attending Dr. Marina Goodell and they are wanting a CT abdomen and pelvis with contrast.  They will continue to follow along [MB]  1238 CT not showing any acute findings.  Reviewed with GI and they feel if patient is feeling well enough she can be discharged to follow-up outpatient.  Reviewed this with the daughter and the patient and they are comfortable  plan for discharge.  Return instructions discussed [MB]    Clinical Course User Index [MB] Terrilee Files, MD                                 Medical Decision Making Amount and/or Complexity of Data Reviewed Labs: ordered. Radiology: ordered.  Risk Prescription drug management.   This patient complains of right upper quadrant pain nausea vomiting jaundice; this involves an extensive number of treatment Options and is a complaint that carries with it a high risk of complications and morbidity. The differential includes bili obstruction, cholangitis, dehydration, infection  I ordered, reviewed and interpreted labs, which included CBC with mildly elevated white count, chemistries with low potassium elevated glucose LFTs normal, urinalysis equivocal for infection, blood culture sent, lactate normal I ordered medication IV fluids and reviewed PMP when indicated. I ordered imaging studies which included CT abdomen and pelvis and I independently    visualized and interpreted imaging which showed stable findings Additional history obtained from patient's daughter Previous records obtained and reviewed in epic including recent GI notes I consulted [facility] [place] Dr. Marina Goodell and PA Milas Gain and discussed lab and imaging findings and discussed disposition.  Cardiac monitoring reviewed, sinus rhythm Social determinants considered, no significant barriers Critical Interventions: None  After  the interventions stated above, I reevaluated the patient and found patient's pain to be improved Admission and further testing considered, no indications for admission at this time.  Recommended close follow-up with GI.  Return instructions discussed.         Final Clinical Impression(s) / ED Diagnoses Final diagnoses:  RUQ abdominal pain  Nausea and vomiting, unspecified vomiting type    Rx / DC Orders ED Discharge Orders     None         Terrilee Files, MD 01/13/24 1657

## 2024-01-13 NOTE — ED Triage Notes (Signed)
Pt scheduled for ERCP 02/19/24, and has 2 stents. Here surgeon on leave, and family reports pt starting to get jaundice. Pt c/o abd bloating , vomiting x this morning

## 2024-01-13 NOTE — Discharge Instructions (Signed)
You were seen in the emergency department for right upper quadrant abdominal pain nausea vomiting.  You had lab work and a CAT scan of your abdomen and pelvis that did not show a definite cause of your symptoms.  You were evaluated by [facility] and they felt you were okay for discharge to follow-up outpatient with your GI team.  Return to the emergency department if any worsening or concerning symptoms.

## 2024-01-13 NOTE — Consult Note (Addendum)
Consultation Note   Referring Provider:  Emergency Services PCP: Zola Button, Grayling Congress, DO Primary Gastroenterologist: Corliss Parish, MD        Reason for Consultation: RUQ pain, CBD stones  DOA: 01/13/2024         Hospital Day: 1   ASSESSMENT    Brief Narrative:  89 y.o. 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 Pancreatic insufficiency  Recurrent choledocholithiasis.  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 She is s/p multiple ERCPs with sphincterotomy, stone extraction, lithotripsy,  and stenting. Not a surgical candidate due to age 52 recent ERCP completed in November at which time bile duct was only partially cleared but stent placed.  Main duct IPMN   RUQ pain / vomiting ( main reason for ED evaluation though daughter was concerned about jaundice.  Her liver chemistries are actually normal  Pancreatic insufficiency Takes Creon  Chronic anticoagulation   Chronic constipation     PLAN:   --CT AP with contrast today. If no acute findings patient can probably be discharged from ED.  --She is already scheduled for ERCP with stent removal and lithotripsy in March with Dr. Meridee Score --Continue current bowel regimen ( linzess, colace, senna).    HPI   Brief GI history Patient has a history of recurrent choledocholithiasis.  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  Her anatomy plus the IPMN has caused significant mucous plugging and biliary stasis.  Due to age she is not a surgical candidate.  Her last ERCP was  November 2024.  Multiple stones and sludge found in bile duct.  The entire biliary tree was severely dilated.  Only partial removal of stone/sludge removed with biliary sphincterotomy, balloon sweeping, lithotripsy.  Two. biliary stents were placed.  She was doing okay at the time of follow-up in the office in January.  Arrangements Maray for repeat ERCP to be done in March for lithotripsy and stent removal  Interval history Patient has chronic intermittent RUQ pain accompanying her chronic biliary problems.  Several days after her last office visit in early January she began having recurrent RUQ pain.  The pain mainly occurs after her evening meals.  Episodes are transient lasting less than 30 minutes.  Unlike previous times the pain has not been severe.  In fact,  massaging her RUQ and laying down and stretching has helped.  She feels bloated in the RUQ . A large bowel movement a couple days ago seem to help.  What is new and concerning for patient and daughter is some nausea and vomiting.  Patient has had intermittent nausea for few days then last night and today had some episodes of vomiting.  No fevers.  She is not having any abdominal pain today.   Notable ED labs / Imaging  UA pending Lipase 20 Potassium 3.4 Renal function normal LFTs normal WBC 11.7, hemoglobin 12, platelets 207   Previous GI Evaluations  Most recent ERCP 10/29/23 - Congested, texture changed mucosa in the esophagus @ site of previous SCC - biopsied. - No other gross lesions in the entire esophagus. - Z-line irregular, 39 cm from the incisors. - Gastritis and gastric ulcers - biopsied for HP evaluation. - No gross lesions in the duodenal bulb and in the sweep. - The major papilla was located entirely within a diverticulum. - The major papilla appeared patulous with a large mucous ball (4-5 cm in size) that took 10 minutes to clear. - The major pancreatic orifice appeared congested - biopsied to rule out dysplasia. - Prior  biliary sphincterotomy appeared open. - Multiple filling defects consistent with stones and sludge was seen on the cholangiogram. - The entire biliary tree was severely dilated. - Choledocholithiasis was found. Partial removal was accomplished with biliary balloon sphincteroplasty, balloon sweeping, mechanical lithotripsy; 2 biliary stents placed for further decompression.  Labs and Imaging: Recent Labs    01/13/24 0806  WBC 11.7*  HGB 12.0  HCT 38.2  PLT 207   Recent Labs    01/13/24 0806  NA 136  K 3.4*  CL 100  CO2 26  GLUCOSE 157*  BUN 21  CREATININE 0.73  CALCIUM 9.4   Recent Labs    01/13/24 0806  PROT 6.3*  ALBUMIN 3.2*  AST 15  ALT 17  ALKPHOS 88  BILITOT 0.5   No results for input(s): "HEPBSAG", "HCVAB", "HEPAIGM", "HEPBIGM" in the last 72 hours. No results for input(s): "LABPROT", "INR" in the last 72 hours.    Past Medical History:  Diagnosis Date   Arthritis    Choledocholithiasis    Diabetes mellitus without complication (HCC)    Diverticulitis    Esophageal cancer (HCC) dx'd 2020   GERD (gastroesophageal reflux disease)    Hyperlipidemia    Hypertension    OSA (obstructive sleep apnea)    Urine incontinence    Uterine cancer (HCC)     Past Surgical History:  Procedure Laterality Date   ABDOMINAL HYSTERECTOMY  1972   APPENDECTOMY     BALLOON DILATION N/A 10/29/2023   Procedure: Billiary BALLOON DILATION;  Surgeon: Lemar Lofty., MD;  Location: [facility] ENDOSCOPY;  Service: Gastroenterology;  Laterality: N/A;   BILIARY DILATION  03/05/2021   Procedure: BILIARY DILATION;  Surgeon: Meridee Score Netty Starring., MD;  Location: [facility] ENDOSCOPY;  Service: Gastroenterology;;   BILIARY DILATION  11/27/2021   Procedure: BILIARY DILATION;  Surgeon: Rachael Fee, MD;  Location: [facility] ENDOSCOPY;  Service: Endoscopy;;   BILIARY DILATION  02/11/2022   Procedure: BILIARY DILATION;  Surgeon: Lemar Lofty., MD;  Location: [facility] ENDOSCOPY;  Service:  Gastroenterology;;   BILIARY STENT PLACEMENT  11/27/2021   Procedure: BILIARY STENT PLACEMENT;  Surgeon: Rachael Fee, MD;  Location: [facility] ENDOSCOPY;  Service: Endoscopy;;   BILIARY STENT PLACEMENT N/A 10/29/2023   Procedure: BILIARY STENT PLACEMENT;  Surgeon: Lemar Lofty., MD;  Location: [facility] ENDOSCOPY;  Service: Gastroenterology;  Laterality: N/A;   BIOPSY  03/05/2021   Procedure: BIOPSY;  Surgeon: Lemar Lofty., MD;  Location: [facility] ENDOSCOPY;  Service: Gastroenterology;;   BIOPSY  11/27/2021   Procedure: BIOPSY;  Surgeon: Rachael Fee, MD;  Location: [facility] ENDOSCOPY;  Service: Endoscopy;;   BIOPSY  10/29/2023   Procedure: BIOPSY;  Surgeon: Lemar Lofty., MD;  Location: [facility] ENDOSCOPY;  Service: Gastroenterology;;   CHOLECYSTECTOMY     ENDOSCOPIC RETROGRADE CHOLANGIOPANCREATOGRAPHY (ERCP) WITH PROPOFOL N/A 03/05/2021   Procedure: ENDOSCOPIC RETROGRADE CHOLANGIOPANCREATOGRAPHY (ERCP) WITH PROPOFOL;  Surgeon: Lemar Lofty., MD;  Location: [facility]OSCOPY;  Service: Gastroenterology;  Laterality: N/A;   ENDOSCOPIC RETROGRADE CHOLANGIOPANCREATOGRAPHY (ERCP) WITH PROPOFOL N/A 11/27/2021   Procedure: ENDOSCOPIC RETROGRADE CHOLANGIOPANCREATOGRAPHY (ERCP) WITH PROPOFOL;  Surgeon: Rachael Fee, MD;  Location: [facility] ENDOSCOPY;  Service: Endoscopy;  Laterality: N/A;  possible EGD prior   ENDOSCOPIC RETROGRADE CHOLANGIOPANCREATOGRAPHY (ERCP) WITH PROPOFOL N/A 02/11/2022   Procedure: ENDOSCOPIC RETROGRADE CHOLANGIOPANCREATOGRAPHY (ERCP) WITH PROPOFOL;  Surgeon: Meridee Score Netty Starring., MD;  Location: [facility] ENDOSCOPY;  Service: Gastroenterology;  Laterality: N/A;   ERCP N/A 10/29/2023   Procedure: ENDOSCOPIC RETROGRADE CHOLANGIOPANCREATOGRAPHY (ERCP);  Surgeon: Lemar Lofty., MD;  Location: [facility] ENDOSCOPY;  Service: Gastroenterology;  Laterality: N/A;   ESOPHAGOGASTRODUODENOSCOPY N/A 11/27/2021   Procedure: ESOPHAGOGASTRODUODENOSCOPY (EGD);  Surgeon: Rachael Fee, MD;  Location: [facility] ENDOSCOPY;  Service: Endoscopy;  Laterality: N/A;   ESOPHAGOGASTRODUODENOSCOPY N/A 10/29/2023   Procedure: ESOPHAGOGASTRODUODENOSCOPY (EGD);  Surgeon: Lemar Lofty., MD;  Location: [facility] ENDOSCOPY;  Service: Gastroenterology;  Laterality: N/A;   ESOPHAGOGASTRODUODENOSCOPY (EGD) WITH PROPOFOL N/A 03/05/2021   Procedure: ESOPHAGOGASTRODUODENOSCOPY (EGD) WITH PROPOFOL;  Surgeon: Meridee Score Netty Starring., MD;  Location: [facility] ENDOSCOPY;  Service: Gastroenterology;  Laterality: N/A;   EUS N/A 03/05/2021   Procedure: UPPER ENDOSCOPIC ULTRASOUND (EUS) RADIAL;  Surgeon: Meridee Score Netty Starring., MD;  Location: [facility] ENDOSCOPY;  Service: Gastroenterology;  Laterality: N/A;   LITHOTRIPSY  02/11/2022   Procedure: LITHOTRIPSY;  Surgeon: Meridee Score Netty Starring., MD;  Location: [facility] ENDOSCOPY;  Service: Gastroenterology;;   LITHOTRIPSY  10/29/2023   Procedure: LITHOTRIPSY;  Surgeon: Meridee Score Netty Starring., MD;  Location: [facility] ENDOSCOPY;  Service: Gastroenterology;;   LUMBAR LAMINECTOMY     REMOVAL OF STONES  03/05/2021   Procedure: REMOVAL OF STONES;  Surgeon: Lemar Lofty., MD;  Location: [facility] ENDOSCOPY;  Service: Gastroenterology;;   REMOVAL OF STONES  11/27/2021   Procedure: REMOVAL OF STONES;  Surgeon: Rachael Fee, MD;  Location: [facility] ENDOSCOPY;  Service: Endoscopy;;   REMOVAL OF STONES  02/11/2022   Procedure: REMOVAL OF STONES;  Surgeon: Lemar Lofty., MD;  Location: [facility] ENDOSCOPY;  Service: Gastroenterology;;   REMOVAL OF STONES  10/29/2023   Procedure: REMOVAL OF STONES;  Surgeon: Lemar Lofty., MD;  Location: [facility] ENDOSCOPY;  Service: Gastroenterology;;   SPINAL CORD STIMULATOR IMPLANT     Not active any more.   SPYGLASS CHOLANGIOSCOPY N/A 03/05/2021   Procedure: SPYGLASS CHOLANGIOSCOPY;  Surgeon: Lemar Lofty., MD;  Location: [facility] ENDOSCOPY;  Service: Gastroenterology;  Laterality: N/A;   SPYGLASS CHOLANGIOSCOPY N/A 02/11/2022   Procedure:  SPYGLASS CHOLANGIOSCOPY;  Surgeon: Lemar Lofty., MD;  Location: [facility] ENDOSCOPY;  Service: Gastroenterology;  Laterality: N/A;   SPYGLASS LITHOTRIPSY N/A 03/05/2021   Procedure: NWGNFAOZ LITHOTRIPSY;  Surgeon: Lemar Lofty., MD;  Location: [facility] ENDOSCOPY;  Service: Gastroenterology;  Laterality: N/A;   SPYGLASS LITHOTRIPSY N/A 02/11/2022   Procedure: HYQMVHQI LITHOTRIPSY;  Surgeon: Lemar Lofty., MD;  Location: [facility] ENDOSCOPY;  Service: Gastroenterology;  Laterality: N/A;   STENT REMOVAL  02/11/2022   Procedure: STENT REMOVAL;  Surgeon: Lemar Lofty., MD;  Location: [facility] ENDOSCOPY;  Service: Gastroenterology;;   TONSILLECTOMY     TOTAL KNEE ARTHROPLASTY Right     Family History  Problem Relation Age of Onset   Diabetes Mother    Heart disease Father    Lung cancer Sister    Hypertension Daughter    Diabetes Daughter    Hypertension Daughter    Lung cancer Son    Hypertension Son    Diabetes  Son    Hypertension Son    Colon cancer Neg Hx    Esophageal cancer Neg Hx    Inflammatory bowel disease Neg Hx    Liver disease Neg Hx    Pancreatic cancer Neg Hx    Rectal cancer Neg Hx    Stomach cancer Neg Hx     Prior to Admission medications   Medication Sig Start Date End Date Taking? Authorizing Provider  bumetanide (BUMEX) 1 MG tablet Take 0.5-1 tablets (0.5-1 mg total) by mouth daily as needed (swelling). 10/31/23   Rolly Salter, MD  CREON [id number] units CPEP capsule TAKE 1 CAPSULE THREE TIMES A DAY BEFORE MEALS Patient taking differently: Take 36,000 Units by mouth See admin instructions. Take 36,000 units by mouth before breakfast and supper/evening meal 05/05/23   Zola Button, Grayling Congress, DO  docusate sodium (COLACE) 100 MG capsule Take 1 capsule (100 mg total) by mouth 2 (two) times daily. Patient taking differently: Take 100-200 mg by mouth See admin instructions. Take 200 mg by mouth in the morning and 100 mg in the evening 03/18/21    Osvaldo Shipper, MD  famciclovir [facility]) 125 MG tablet TAKE 1 TABLET DAILY 03/31/23   Zola Button, Myrene Buddy R, DO  glucose blood (FREESTYLE LITE) test strip CHECK BLOOD SUGAR TWICE A DAY 11/20/22   Zola Button, Grayling Congress, DO  JARDIANCE 25 MG TABS tablet TAKE 1 TABLET DAILY BEFORE BREAKFAST 02/04/23   Zola Button, Yvonne R, DO  latanoprost (XALATAN) 0.005 % ophthalmic solution Place 1 drop into both eyes at bedtime. 08/24/21   Donato Schultz, DO  LINZESS 145 MCG CAPS capsule TAKE 1 CAPSULE DAILY BEFORE BREAKFAST 01/08/24   Zola Button, Myrene Buddy R, DO  mirtazapine (REMERON) 15 MG tablet TAKE 1 TABLET AT BEDTIME 04/16/23   Zola Button, Grayling Congress, DO  morphine (MS CONTIN) 30 MG 12 hr tablet Take 30 mg by mouth every 12 (twelve) hours. 01/21/22   [provider]  Multiple Vitamin (MULTIVITAMIN WITH MINERALS) TABS tablet Take 1 tablet by mouth daily with supper.    [provider]  omeprazole (PRILOSEC) 40 MG capsule Take 1 capsule (40 mg total) by mouth in the morning and at bedtime. 11/03/23 02/01/24  Mansouraty, Netty Starring., MD  senna (SENOKOT) 8.6 MG TABS tablet Take 1 tablet (8.6 mg total) by mouth daily. 11/29/21   Regalado, Belkys A, MD  senna-docusate (SENNA S) 8.6-50 MG tablet Take 1 tablet by mouth See admin instructions. Take 1 tablet by mouth in the morning and evening    [provider]  sucralfate (CARAFATE) 1 g tablet Take 1 tablet (1 g total) by mouth 2 (two) times daily. 12/23/23   Mansouraty, Netty Starring., MD  TYLENOL 500 MG tablet Take 500-1,000 mg by mouth every 6 (six) hours as needed for mild pain (pain score 1-3) or headache.    [provider]  XARELTO 20 MG TABS tablet TAKE 1 TABLET DAILY WITH SUPPER, START ONLY AFTER THE STARTER PACK HAS BEEN COMPLETED Patient taking differently: Take 20 mg by mouth daily with supper. 10/23/23   Seabron Spates R, DO    No current facility-administered medications for this encounter.   Current Outpatient Medications   Medication Sig Dispense Refill   bumetanide (BUMEX) 1 MG tablet Take 0.5-1 tablets (0.5-1 mg total) by mouth daily as needed (swelling).     CREON 36000-114000 units CPEP capsule TAKE 1 CAPSULE THREE TIMES A DAY BEFORE MEALS (Patient taking differently: Take  36,000 Units by mouth See admin instructions. Take 36,000 units by mouth before breakfast and supper/evening meal) 2 capsule 4   docusate sodium (COLACE) 100 MG capsule Take 1 capsule (100 mg total) by mouth 2 (two) times daily. (Patient taking differently: Take 100-200 mg by mouth See admin instructions. Take 200 mg by mouth in the morning and 100 mg in the evening) 10 capsule 0   famciclovir (FAMVIR) 125 MG tablet TAKE 1 TABLET DAILY 90 tablet 3   glucose blood (FREESTYLE LITE) test strip CHECK BLOOD SUGAR TWICE A DAY 200 strip 12   JARDIANCE 25 MG TABS tablet TAKE 1 TABLET DAILY BEFORE BREAKFAST 90 tablet 3   latanoprost (XALATAN) 0.005 % ophthalmic solution Place 1 drop into both eyes at bedtime. 2.5 mL 2   LINZESS 145 MCG CAPS capsule TAKE 1 CAPSULE DAILY BEFORE BREAKFAST 90 capsule 3   mirtazapine (REMERON) 15 MG tablet TAKE 1 TABLET AT BEDTIME 90 tablet 3   morphine (MS CONTIN) 30 MG 12 hr tablet Take 30 mg by mouth every 12 (twelve) hours.     Multiple Vitamin (MULTIVITAMIN WITH MINERALS) TABS tablet Take 1 tablet by mouth daily with supper.     omeprazole (PRILOSEC) 40 MG capsule Take 1 capsule (40 mg total) by mouth in the morning and at bedtime. 180 capsule 0   senna (SENOKOT) 8.6 MG TABS tablet Take 1 tablet (8.6 mg total) by mouth daily. 120 tablet 0   senna-docusate (SENNA S) 8.6-50 MG tablet Take 1 tablet by mouth See admin instructions. Take 1 tablet by mouth in the morning and evening     sucralfate (CARAFATE) 1 g tablet Take 1 tablet (1 g total) by mouth 2 (two) times daily. 60 tablet 2   TYLENOL 500 MG tablet Take 500-1,000 mg by mouth every 6 (six) hours as needed for mild pain (pain score 1-3) or headache.     XARELTO 20  MG TABS tablet TAKE 1 TABLET DAILY WITH SUPPER, START ONLY AFTER THE STARTER PACK HAS BEEN COMPLETED (Patient taking differently: Take 20 mg by mouth daily with supper.) 90 tablet 3    Allergies as of 01/13/2024 - Reviewed 01/13/2024  Allergen Reaction Noted   Gramicidin Other (See Comments) 01/17/2021   Sulfa antibiotics Rash 10/19/2020   Wound dressing adhesive Rash and Other (See Comments) 10/19/2020    Social History   Socioeconomic History   Marital status: Widowed    Spouse name: Not on file   Number of children: 4   Years of education: Not on file   Highest education level: Not on file  Occupational History   Occupation: Retired Lawyer  Tobacco Use   Smoking status: Former    Current packs/day: 0.00    Average packs/day: 1 pack/day for 35.0 years (35.0 ttl pk-yrs)    Types: Cigarettes    Start date: 01/17/1954    Quit date: 01/17/1989    Years since quitting: 35.0   Smokeless tobacco: Never  Vaping Use   Vaping status: Never Used  Substance and Sexual Activity   Alcohol use: Not Currently   Drug use: Never   Sexual activity: Not Currently  Other Topics Concern   Not on file  Social History Narrative   Just moved from [place]--- and moved into an apt 2.4 miles from her daughter    Social Drivers of Health   Financial Resource Strain: Low Risk  (04/02/2022)   Overall Financial Resource Strain (CARDIA)    Difficulty of Paying Living Expenses:  Not hard at all  Food Insecurity: No Food Insecurity (11/03/2023)   Hunger Vital Sign    Worried About Running Out of Food in the Last Year: Never true    Ran Out of Food in the Last Year: Never true  Transportation Needs: No Transportation Needs (11/03/2023)   PRAPARE - Administrator, Civil Service (Medical): No    Lack of Transportation (Non-Medical): No  Physical Activity: Sufficiently Active (04/02/2022)   Exercise Vital Sign    Days of Exercise per Week: 5 days    Minutes of Exercise per Session: 60 min  Stress: No  Stress Concern Present (04/02/2022)   [employer] of Occupational Health - Occupational Stress Questionnaire    Feeling of Stress : Not at all  Social Connections: Moderately Isolated (04/02/2022)   Social Connection and Isolation Panel [NHANES]    Frequency of Communication with Friends and Family: More than three times a week    Frequency of Social Gatherings with Friends and Family: Three times a week    Attends Religious Services: More than 4 times per year    Active Member of Clubs or Organizations: No    Attends Banker Meetings: Never    Marital Status: Widowed  Intimate Partner Violence: Not At Risk (11/03/2023)   Humiliation, Afraid, Rape, and Kick questionnaire    Fear of Current or Ex-Partner: No    Emotionally Abused: No    Physically Abused: No    Sexually Abused: No     Code Status   Code Status: Prior  Review of Systems: Positive for fatigue . All other systems reviewed and negative except where noted in HPI.  Physical Exam: Vital signs in last 24 hours: Temp:  [98 F (36.7 C)] 98 F (36.7 C) (01/28 0706) Pulse Rate:  [64] 64 (01/28 0706) Resp:  [20] 20 (01/28 0706) SpO2:  [96 %] 96 % (01/28 0706) Weight:  [67.1 kg] 67.1 kg (01/28 0819)    General:  Pleasant fe female in NAD Psych:  Cooperative. Normal mood and affect Eyes: Pupils equal Ears:  Hard of hearing Nose: No deformity, discharge or lesions Neck:  Supple, no masses felt Lungs:  Clear to auscultation.  Heart:  Regular rate, regular rhythm.  Abdomen:  Soft, nondistended, nontender, active bowel sounds, no masses felt Rectal :  Deferred Msk: Symmetrical without gross deformities.  Neurologic:  Alert, oriented, grossly normal neurologically Extremities : No edema Skin:  Intact without significant lesions.    Intake/Output from previous day: No intake/output data recorded. Intake/Output this shift:  No intake/output data recorded.   Willette Cluster, NP-C   01/13/2024, 10:24  AM  GI ATTENDING  History, laboratories, x-rays, prior ERCP examinations all personally reviewed.  Agree with comprehensive consultation note as outlined above.  No evidence for stent dysfunction.  Liver test normal.  Mild leukocytosis and abnormal urinalysis noted.  CT pending.  No indication for biliary intervention based on current information and results.  Agree with plans for CT scan to assess for other significant causes for right-sided pain.  Exam benign currently.  If CT unremarkable, she can go home and keep her follow-up with Dr. Meridee Score as planned.  Wilhemina Bonito. Eda Keys., M.D. [facility] Division of Gastroenterology

## 2024-01-14 ENCOUNTER — Encounter: Payer: Self-pay | Admitting: Gastroenterology

## 2024-01-14 DIAGNOSIS — K8050 Calculus of bile duct without cholangitis or cholecystitis without obstruction: Secondary | ICD-10-CM

## 2024-01-14 DIAGNOSIS — D490 Neoplasm of unspecified behavior of digestive system: Secondary | ICD-10-CM

## 2024-01-14 DIAGNOSIS — Z9889 Other specified postprocedural states: Secondary | ICD-10-CM

## 2024-01-14 NOTE — Telephone Encounter (Signed)
She has an ERCP on 3/6 is this ok?

## 2024-01-14 NOTE — Telephone Encounter (Signed)
Dr Meridee Score the pt has an ERCP in 3/6. She was seen in the ED (see notes) would you like to see her in the office prior?

## 2024-01-15 ENCOUNTER — Inpatient Hospital Stay: Payer: Medicare Other | Attending: Oncology | Admitting: Oncology

## 2024-01-15 VITALS — BP 180/44 | HR 63 | Temp 98.1°F | Resp 18 | Ht 64.0 in | Wt 140.8 lb

## 2024-01-15 DIAGNOSIS — C154 Malignant neoplasm of middle third of esophagus: Secondary | ICD-10-CM | POA: Diagnosis not present

## 2024-01-15 DIAGNOSIS — Z86711 Personal history of pulmonary embolism: Secondary | ICD-10-CM | POA: Insufficient documentation

## 2024-01-15 DIAGNOSIS — I10 Essential (primary) hypertension: Secondary | ICD-10-CM | POA: Insufficient documentation

## 2024-01-15 DIAGNOSIS — Z8542 Personal history of malignant neoplasm of other parts of uterus: Secondary | ICD-10-CM | POA: Diagnosis not present

## 2024-01-15 DIAGNOSIS — Z8501 Personal history of malignant neoplasm of esophagus: Secondary | ICD-10-CM | POA: Insufficient documentation

## 2024-01-15 DIAGNOSIS — E119 Type 2 diabetes mellitus without complications: Secondary | ICD-10-CM | POA: Diagnosis not present

## 2024-01-15 DIAGNOSIS — Z7901 Long term (current) use of anticoagulants: Secondary | ICD-10-CM | POA: Diagnosis not present

## 2024-01-15 NOTE — Progress Notes (Signed)
[facility] OFFICE PROGRESS NOTE   Diagnosis: Esophagus cancer  INTERVAL HISTORY:   [name] returns as scheduled.  Good appetite.  No dysphagia.  She has chronic constipation relieved with a stool softener and laxative regimen. She developed upper abdominal pain earlier this week.  She is seen in the emergency room 01/12/2023.  A CT revealed no acute change.  The pain improved the following day.  She was seen by gastroenterology.  She is scheduled for an ERCP in March.  Objective:  Vital signs in last 24 hours:  Blood pressure (!) 180/44, pulse 63, temperature 98.1 F (36.7 C), temperature source Temporal, resp. rate 18, height 5\' 4"  (1.626 m), weight 140 lb 12.8 oz (63.9 kg), SpO2 100%.    Lymphatics: No cervical, supraclavicular, axillary, or inguinal nodes Resp: Scattered coarse end inspiratory rhonchi, no respiratory distress Cardio: Regular rate and rhythm GI: Nontender, no mass, no hepatosplenomegaly, right abdomen stimulator device Vascular: No leg edema   Lab Results:  Lab Results  Component Value Date   WBC 11.7 (H) 01/13/2024   HGB 12.0 01/13/2024   HCT 38.2 01/13/2024   MCV 88.0 01/13/2024   PLT 207 01/13/2024   NEUTROABS 5,468 11/07/2023    CMP  Lab Results  Component Value Date   NA 136 01/13/2024   K 3.4 (L) 01/13/2024   CL 100 01/13/2024   CO2 26 01/13/2024   GLUCOSE 157 (H) 01/13/2024   BUN 21 01/13/2024   CREATININE 0.73 01/13/2024   CALCIUM 9.4 01/13/2024   PROT 6.3 (L) 01/13/2024   ALBUMIN 3.2 (L) 01/13/2024   AST 15 01/13/2024   ALT 17 01/13/2024   ALKPHOS 88 01/13/2024   BILITOT 0.5 01/13/2024   GFRNONAA >60 01/13/2024    Lab Results  Component Value Date   KGM010 37 (H) 10/28/2023     Imaging:  CT ABDOMEN PELVIS W CONTRAST Result Date: 01/13/2024 CLINICAL DATA:  Right upper quadrant pain.  Biliary stents. EXAM: CT ABDOMEN AND PELVIS WITH CONTRAST TECHNIQUE: Multidetector CT imaging of the abdomen and pelvis  was performed using the standard protocol following bolus administration of intravenous contrast. RADIATION DOSE REDUCTION: This exam was performed according to the departmental dose-optimization program which includes automated exposure control, adjustment of the mA and/or kV according to patient size and/or use of iterative reconstruction technique. CONTRAST:  OMNIPAQUE IOHEXOL 300 MG/ML  SOLN COMPARISON:  10/27/2023 FINDINGS: Lower chest: Focal opacity along the medial right lower lobe on image 17/6 has minimally changed since 11/25/2021. 4 mm ground-glass nodular opacity in the right lower lobe on image 7/6 is stable since 03/15/2021. No large pleural effusions. Hepatobiliary: There are 2 nonmetallic biliary stents extends down the common bile duct into the duodenum. The left intrahepatic bile ducts are dilated and gas-filled. There is no significant biliary dilatation in the right hepatic lobe. Common bile duct appears to be dilated containing fluid and gas. Limited evaluation for bile duct stones. Portal venous system is patent. Evidence for focal fat in the liver near the falciform ligament. Pancreas: Diffuse dilatation of the main pancreatic duct. No acute pancreatic inflammation. Pancreas is atrophic. Spleen: Normal in size without focal abnormality. Adrenals/Urinary Tract: Adrenal glands are within normal limits. No suspicious renal lesions. No hydronephrosis. Normal appearance of the urinary bladder. Stomach/Bowel: Normal appearance of the stomach. The duodenum is distended with fluid at the location of the biliary stents. No small bowel dilatation. Rectum is distended with stool. Colonic diverticulosis. No acute bowel inflammation. Vascular/Lymphatic: Abdominal  aorta is heavily calcified without aneurysm. No calcifications near the origin of the SMA. No significant lymph node enlargement in the abdomen or pelvis. Reproductive: Uterus appears to be surgically absent. No evidence for an adnexal mass.  Limited evaluation of the pelvic structures due to artifact from the spinal neurostimulator generator on the right side of the abdomen. Other: Negative for free fluid. Negative for free air. Small periumbilical or hernia or hernias containing fat. Musculoskeletal: Left hip arthroplasty is located. There is a neurostimulator generator on the right side of the abdomen with leads going into the epidural space at L1-L2. Anterolisthesis of L4-L5 and L5-S1 appears to be secondary to facet arthropathy. Disc space narrowing at L5-S1. IMPRESSION: 1. Pneumobilia with biliary stents. These are expected findings and no evidence for an acute biliary or hepatic abnormality. Limited evaluation for bile duct stones. 2. Stable diffuse dilatation of the main pancreatic duct. 3. Colonic diverticulosis without acute inflammation. 4. Aortic Atherosclerosis (ICD10-I70.0). Electronically Signed   By: Richarda Overlie M.D.   On: 01/13/2024 12:09    Medications: I have reviewed the patient's current medications.   Assessment/Plan:  Esophagus cancer Upper EUS 03/05/2021-small submucosal ulcerating nodule with no bleeding and no stigmata of recent bleeding in the midesophagus, 29 to 31 cm from the incisors.  Lesion nonobstructing and not circumferential.  Patchy mildly erythematous mucosa without bleeding was found in the gastric body and gastric antrum.  The esophagus lesion was staged T1sm N0 MX.  Pancreatic parenchymal abnormalities consisting of atrophy were noted in the entire pancreas.  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  There was dilation of the common bile duct.  No malignant appearing lymph nodes were visualized in the middle paraesophageal mediastinum, lower paraesophageal mediastinum, celiac region, peripancreatic region and porta hepatis region.  Esophagus  biopsy showed invasive carcinoma.  Stomach biopsy showed reactive gastropathy; no intestinal metaplasia, dysplasia or malignancy. PET 04/02/2021- low-level hypermetabolism associated with a 0.7 cm nodule at the mid thoracic esophagus, hypermetabolic cystic pancreas head mass with diffuse intrahepatic and extra fatigability duct dilatation compatible with malignant main duct IPMN, no evidence of metastatic disease CT chest and pelvis 03/16/2021-nonocclusive right lower lobe pulmonary embolus extending into segmental branches of the right lower lobe.  Nodular area at the left lung base with bandlike changes that extend from this area on the current study and on the prior study.  This may represent a combination of scarring and atelectasis.  Measures 1.4 x 1.2 cm, previously 2.1 x 1.9 cm.  Suggestion of slight interval decrease in size as well of a right lower lobe process with nodular features.  Persistent marked biliary duct distention. Radiation/Xeloda 07/26/2021-09/04/2021 Endoscopy at the time of ERCP 11/27/2021-no residual tumor seen Endoscopy at time of ERCP 10/29/2023-no esophageal mass CT abdomen/pelvis 01/05/2024: Pneumobilia with biliary stents, diffuse dilation of the main pancreatic duct, diverticulosis CT scan of the abdomen on 01/25/2021 due to history of a pancreatic mass.  The CT showed gross intra and extrahepatic biliary ductal dilatation, the central common bile duct appeared to contain a poorly calcified calculus or soft tissue nodule measuring approximately 1.9 cm.  There was diffuse atrophy of the pancreatic parenchyma and dilatation of the main pancreatic duct.  There was a hypodense mass or gross pancreatic ductal dilatation within the pancreatic head measuring 3.5 x 2.0 x 2.0 cm.  Nodular appearing consolidation  of the dependent left lung base measuring 2.4 x 1.8 cm, nonspecific. CT abdomen/pelvis 11/25/2021-3.4 cm cystic lesion in the pancreas head/uncinate, intrahepatic and extrapelvic  ductal dilatation with evidence of choledocholithiasis in the distal common duct                               ERCP by Dr. Meridee Score on 03/05/2021.  The major papilla was located entirely within a pantaloon diverticulum.  Prior biliary sphincterotomy appeared open.  Pancreatic duct fishmouth deformity noted.  Filling defects consistent with stones and sludge were seen on the cholangiogram.  The entire main bile duct was severely dilated.  Choledocholithiasis was found.  Biliary sphincteroplasty performed.  Electrohydraulic lithotripsy of a remaining stone performed.    Pulmonary embolus on chest CT 03/15/2021-on Xarelto.  Bilateral lower extremity venous Doppler study with no significant femoral-popliteal DVT.  Limited assessment of the calf veins.  5.7 cm left popliteal fossa Baker's cyst. GERD Hypertension Diabetes OSA Subclavian arterial stenosis History of uterine cancer Chronic back pain maintained on MS Contin, followed by Dr. Ethelene Hal IPMN- diagnosed in July 2020 Biliary stones-status post ERCP/sphincterotomy 07/14/2019 for removal of stones Admission 11/25/2021 with recurrent choledocholithiasis, status post ERCP and stone removal 11/27/2021, bile duct stent placed 02/11/2022-ERCP with removal of biliary stent and removal of choledocholithiasis 10/29/2023-ERCP with removal of biliary stones and biliary stent placement        Disposition: Ms. Dolliver is in clinical remission from esophagus cancer.  She will continue endoscopic/ERCP follow-up with Dr. Meridee Score for management of recurrent choledocholithiasis, surveillance for esophagus cancer, and the IPMN.  She plans to continue clinical follow-up with Dr. Laury Axon for multiple medical conditions. She will be discharged to the medical oncology clinic.  I am available to see her as needed.  Thornton Papas, MD  01/15/2024  2:40 PM

## 2024-01-17 ENCOUNTER — Other Ambulatory Visit: Payer: Self-pay | Admitting: Gastroenterology

## 2024-01-18 LAB — CULTURE, BLOOD (ROUTINE X 2)
Culture: NO GROWTH
Special Requests: ADEQUATE

## 2024-01-19 ENCOUNTER — Encounter: Payer: Self-pay | Admitting: Family Medicine

## 2024-01-19 ENCOUNTER — Other Ambulatory Visit: Payer: Self-pay | Admitting: Family Medicine

## 2024-01-19 DIAGNOSIS — I10 Essential (primary) hypertension: Secondary | ICD-10-CM

## 2024-01-19 MED ORDER — VALSARTAN 40 MG PO TABS: 40.0000 mg | ORAL_TABLET | Freq: Every day | ORAL | 3 refills | Status: DC

## 2024-01-22 NOTE — Addendum Note (Signed)
 Addended by: Aneita Keens on: 01/22/2024 04:55 PM   Modules accepted: Orders

## 2024-01-26 ENCOUNTER — Other Ambulatory Visit (INDEPENDENT_AMBULATORY_CARE_PROVIDER_SITE_OTHER): Payer: Medicare Other

## 2024-01-26 DIAGNOSIS — D490 Neoplasm of unspecified behavior of digestive system: Secondary | ICD-10-CM | POA: Diagnosis not present

## 2024-01-26 DIAGNOSIS — K8050 Calculus of bile duct without cholangitis or cholecystitis without obstruction: Secondary | ICD-10-CM

## 2024-01-26 DIAGNOSIS — Z9889 Other specified postprocedural states: Secondary | ICD-10-CM | POA: Diagnosis not present

## 2024-01-26 LAB — COMPREHENSIVE METABOLIC PANEL
ALT: 12 U/L (ref 0–35)
AST: 10 U/L (ref 0–37)
Albumin: 3.6 g/dL (ref 3.5–5.2)
Alkaline Phosphatase: 87 U/L (ref 39–117)
BUN: 13 mg/dL (ref 6–23)
CO2: 25 meq/L (ref 19–32)
Calcium: 9.5 mg/dL (ref 8.4–10.5)
Chloride: 100 meq/L (ref 96–112)
Creatinine, Ser: 0.82 mg/dL (ref 0.40–1.20)
GFR: 61.42 mL/min (ref >60.00)
Glucose, Bld: 152 mg/dL — ABNORMAL HIGH (ref 70–99)
Potassium: 4.1 meq/L (ref 3.5–5.1)
Sodium: 135 meq/L (ref 135–145)
Total Bilirubin: 0.4 mg/dL (ref 0.2–1.2)
Total Protein: 6.5 g/dL (ref 6.0–8.3)

## 2024-01-26 NOTE — Telephone Encounter (Signed)
 Ro do you know if this pt will be on carafate  long term. She is asking for 90 day supply but I do not see any mention of how long he thinks she may be on it.

## 2024-01-27 ENCOUNTER — Encounter: Payer: Self-pay | Admitting: Gastroenterology

## 2024-01-27 NOTE — Telephone Encounter (Signed)
OK for long-term use if she likes. No more than twice daily. Thanks. GM

## 2024-01-28 ENCOUNTER — Other Ambulatory Visit: Payer: Self-pay

## 2024-01-28 MED ORDER — SUCRALFATE 1 G PO TABS
1.0000 g | ORAL_TABLET | Freq: Two times a day (BID) | ORAL | 2 refills | Status: DC
Start: 2024-01-28 — End: 2024-03-08

## 2024-01-28 NOTE — Telephone Encounter (Signed)
Refill for Carafate 90 day supply sent to [facility] pharmacy.

## 2024-01-30 ENCOUNTER — Other Ambulatory Visit: Payer: Self-pay | Admitting: Family Medicine

## 2024-02-02 ENCOUNTER — Telehealth: Payer: Self-pay | Admitting: Gastroenterology

## 2024-02-02 NOTE — Telephone Encounter (Signed)
 I spoke with the pt's daughter who wanted to update the patient condition. She states that her abd discomfort is about the same but she has started to complain of her food not tasting right-she says it taste "spoiled". The daughter is trying to get nutrition in by adding Ensure, oatmeal and other fluids to prevent dehydration.  She has the ERCP set up for 3/6 and she will keep that as planned.  I will forward to Dr Sable Feil as an Lorain Childes and other recommendations.

## 2024-02-02 NOTE — Telephone Encounter (Signed)
 No further recommendations. If an earlier ERCP slot opens up, she can be placed (though will need antiplatelet hold).

## 2024-02-02 NOTE — Telephone Encounter (Signed)
 Patients dauhter called requesting to speak with a nurse regarding the ERCP that is scheduled.

## 2024-02-04 ENCOUNTER — Emergency Department (HOSPITAL_BASED_OUTPATIENT_CLINIC_OR_DEPARTMENT_OTHER): Payer: Medicare Other

## 2024-02-04 ENCOUNTER — Other Ambulatory Visit: Payer: Self-pay

## 2024-02-04 ENCOUNTER — Encounter (HOSPITAL_BASED_OUTPATIENT_CLINIC_OR_DEPARTMENT_OTHER): Payer: Self-pay

## 2024-02-04 ENCOUNTER — Inpatient Hospital Stay (HOSPITAL_BASED_OUTPATIENT_CLINIC_OR_DEPARTMENT_OTHER)
Admission: EM | Admit: 2024-02-04 | Discharge: 2024-02-20 | DRG: 389 | Disposition: A | Discharge status: Skilled Nursing Facility | Payer: Medicare Other | Attending: Family Medicine | Admitting: Family Medicine

## 2024-02-04 DIAGNOSIS — I708 Atherosclerosis of other arteries: Secondary | ICD-10-CM | POA: Diagnosis present

## 2024-02-04 DIAGNOSIS — G894 Chronic pain syndrome: Secondary | ICD-10-CM | POA: Diagnosis present

## 2024-02-04 DIAGNOSIS — Z66 Do not resuscitate: Secondary | ICD-10-CM | POA: Diagnosis present

## 2024-02-04 DIAGNOSIS — K219 Gastro-esophageal reflux disease without esophagitis: Secondary | ICD-10-CM | POA: Diagnosis present

## 2024-02-04 DIAGNOSIS — Z8542 Personal history of malignant neoplasm of other parts of uterus: Secondary | ICD-10-CM

## 2024-02-04 DIAGNOSIS — Z8711 Personal history of peptic ulcer disease: Secondary | ICD-10-CM

## 2024-02-04 DIAGNOSIS — Z91048 Other nonmedicinal substance allergy status: Secondary | ICD-10-CM

## 2024-02-04 DIAGNOSIS — I2609 Other pulmonary embolism with acute cor pulmonale: Secondary | ICD-10-CM | POA: Diagnosis present

## 2024-02-04 DIAGNOSIS — K581 Irritable bowel syndrome with constipation: Secondary | ICD-10-CM | POA: Diagnosis present

## 2024-02-04 DIAGNOSIS — K2100 Gastro-esophageal reflux disease with esophagitis, without bleeding: Secondary | ICD-10-CM | POA: Diagnosis present

## 2024-02-04 DIAGNOSIS — K56609 Unspecified intestinal obstruction, unspecified as to partial versus complete obstruction: Secondary | ICD-10-CM | POA: Diagnosis not present

## 2024-02-04 DIAGNOSIS — E876 Hypokalemia: Secondary | ICD-10-CM | POA: Diagnosis not present

## 2024-02-04 DIAGNOSIS — K567 Ileus, unspecified: Secondary | ICD-10-CM | POA: Diagnosis not present

## 2024-02-04 DIAGNOSIS — Z87891 Personal history of nicotine dependence: Secondary | ICD-10-CM

## 2024-02-04 DIAGNOSIS — R109 Unspecified abdominal pain: Secondary | ICD-10-CM | POA: Diagnosis present

## 2024-02-04 DIAGNOSIS — Z86711 Personal history of pulmonary embolism: Secondary | ICD-10-CM

## 2024-02-04 DIAGNOSIS — C154 Malignant neoplasm of middle third of esophagus: Secondary | ICD-10-CM | POA: Diagnosis present

## 2024-02-04 DIAGNOSIS — T402X5A Adverse effect of other opioids, initial encounter: Secondary | ICD-10-CM | POA: Diagnosis present

## 2024-02-04 DIAGNOSIS — Z9049 Acquired absence of other specified parts of digestive tract: Secondary | ICD-10-CM

## 2024-02-04 DIAGNOSIS — J439 Emphysema, unspecified: Secondary | ICD-10-CM | POA: Diagnosis present

## 2024-02-04 DIAGNOSIS — I10 Essential (primary) hypertension: Secondary | ICD-10-CM | POA: Diagnosis present

## 2024-02-04 DIAGNOSIS — K8689 Other specified diseases of pancreas: Secondary | ICD-10-CM | POA: Diagnosis not present

## 2024-02-04 DIAGNOSIS — Z79899 Other long term (current) drug therapy: Secondary | ICD-10-CM

## 2024-02-04 DIAGNOSIS — K5909 Other constipation: Secondary | ICD-10-CM | POA: Insufficient documentation

## 2024-02-04 DIAGNOSIS — G8929 Other chronic pain: Secondary | ICD-10-CM | POA: Diagnosis not present

## 2024-02-04 DIAGNOSIS — Z801 Family history of malignant neoplasm of trachea, bronchus and lung: Secondary | ICD-10-CM

## 2024-02-04 DIAGNOSIS — K861 Other chronic pancreatitis: Secondary | ICD-10-CM | POA: Diagnosis present

## 2024-02-04 DIAGNOSIS — R439 Unspecified disturbances of smell and taste: Secondary | ICD-10-CM | POA: Diagnosis present

## 2024-02-04 DIAGNOSIS — E1142 Type 2 diabetes mellitus with diabetic polyneuropathy: Secondary | ICD-10-CM | POA: Diagnosis present

## 2024-02-04 DIAGNOSIS — E871 Hypo-osmolality and hyponatremia: Secondary | ICD-10-CM | POA: Diagnosis present

## 2024-02-04 DIAGNOSIS — Z85828 Personal history of other malignant neoplasm of skin: Secondary | ICD-10-CM

## 2024-02-04 DIAGNOSIS — Z7901 Long term (current) use of anticoagulants: Secondary | ICD-10-CM | POA: Diagnosis not present

## 2024-02-04 DIAGNOSIS — K8051 Calculus of bile duct without cholangitis or cholecystitis with obstruction: Secondary | ICD-10-CM | POA: Diagnosis present

## 2024-02-04 DIAGNOSIS — K599 Functional intestinal disorder, unspecified: Secondary | ICD-10-CM | POA: Diagnosis not present

## 2024-02-04 DIAGNOSIS — K560 Paralytic ileus: Secondary | ICD-10-CM | POA: Diagnosis present

## 2024-02-04 DIAGNOSIS — D72829 Elevated white blood cell count, unspecified: Secondary | ICD-10-CM | POA: Diagnosis not present

## 2024-02-04 DIAGNOSIS — K30 Functional dyspepsia: Secondary | ICD-10-CM | POA: Diagnosis present

## 2024-02-04 DIAGNOSIS — E1165 Type 2 diabetes mellitus with hyperglycemia: Secondary | ICD-10-CM | POA: Diagnosis present

## 2024-02-04 DIAGNOSIS — K5669 Other partial intestinal obstruction: Secondary | ICD-10-CM | POA: Diagnosis not present

## 2024-02-04 DIAGNOSIS — Z96651 Presence of right artificial knee joint: Secondary | ICD-10-CM | POA: Diagnosis present

## 2024-02-04 DIAGNOSIS — R112 Nausea with vomiting, unspecified: Secondary | ICD-10-CM | POA: Diagnosis present

## 2024-02-04 DIAGNOSIS — E785 Hyperlipidemia, unspecified: Secondary | ICD-10-CM | POA: Diagnosis present

## 2024-02-04 DIAGNOSIS — Z882 Allergy status to sulfonamides status: Secondary | ICD-10-CM

## 2024-02-04 DIAGNOSIS — Z86718 Personal history of other venous thrombosis and embolism: Secondary | ICD-10-CM

## 2024-02-04 DIAGNOSIS — R21 Rash and other nonspecific skin eruption: Secondary | ICD-10-CM | POA: Diagnosis present

## 2024-02-04 DIAGNOSIS — R339 Retention of urine, unspecified: Secondary | ICD-10-CM | POA: Diagnosis not present

## 2024-02-04 DIAGNOSIS — Z6822 Body mass index (BMI) 22.0-22.9, adult: Secondary | ICD-10-CM

## 2024-02-04 DIAGNOSIS — Z9682 Presence of neurostimulator: Secondary | ICD-10-CM

## 2024-02-04 DIAGNOSIS — K9289 Other specified diseases of the digestive system: Secondary | ICD-10-CM | POA: Insufficient documentation

## 2024-02-04 DIAGNOSIS — R1031 Right lower quadrant pain: Secondary | ICD-10-CM | POA: Diagnosis not present

## 2024-02-04 DIAGNOSIS — E440 Moderate protein-calorie malnutrition: Secondary | ICD-10-CM | POA: Diagnosis present

## 2024-02-04 DIAGNOSIS — G4733 Obstructive sleep apnea (adult) (pediatric): Secondary | ICD-10-CM | POA: Diagnosis present

## 2024-02-04 DIAGNOSIS — R140 Abdominal distension (gaseous): Secondary | ICD-10-CM | POA: Diagnosis not present

## 2024-02-04 DIAGNOSIS — Z8501 Personal history of malignant neoplasm of esophagus: Secondary | ICD-10-CM

## 2024-02-04 DIAGNOSIS — Z90710 Acquired absence of both cervix and uterus: Secondary | ICD-10-CM

## 2024-02-04 DIAGNOSIS — Z7984 Long term (current) use of oral hypoglycemic drugs: Secondary | ICD-10-CM

## 2024-02-04 DIAGNOSIS — K831 Obstruction of bile duct: Secondary | ICD-10-CM | POA: Diagnosis not present

## 2024-02-04 DIAGNOSIS — Z833 Family history of diabetes mellitus: Secondary | ICD-10-CM

## 2024-02-04 DIAGNOSIS — Z8249 Family history of ischemic heart disease and other diseases of the circulatory system: Secondary | ICD-10-CM

## 2024-02-04 LAB — CBC
HCT: 37.2 % (ref 36.0–46.0)
Hemoglobin: 12 g/dL (ref 12.0–15.0)
MCH: 27.1 pg (ref 26.0–34.0)
MCHC: 32.3 g/dL (ref 30.0–36.0)
MCV: 84.2 fL (ref 80.0–100.0)
Platelets: 264 10*3/uL (ref 150–400)
RBC: 4.42 MIL/uL (ref 3.87–5.11)
RDW: 14.1 % (ref 11.5–15.5)
WBC: 17 10*3/uL — ABNORMAL HIGH (ref 4.0–10.5)
nRBC: 0 % (ref 0.0–0.2)

## 2024-02-04 LAB — URINALYSIS, ROUTINE W REFLEX MICROSCOPIC
Bilirubin Urine: NEGATIVE
Glucose, UA: >=500 mg/dL — AB
Hgb urine dipstick: NEGATIVE
Ketones, ur: 40 mg/dL — AB
Leukocytes,Ua: NEGATIVE
Nitrite: NEGATIVE
Protein, ur: 100 mg/dL — AB
Specific Gravity, Urine: 1.015 (ref 1.005–1.030)
pH: 5.5 (ref 5.0–8.0)

## 2024-02-04 LAB — COMPREHENSIVE METABOLIC PANEL
ALT: 15 U/L (ref 0–44)
AST: 10 U/L — ABNORMAL LOW (ref 15–41)
Albumin: 2.8 g/dL — ABNORMAL LOW (ref 3.5–5.0)
Alkaline Phosphatase: 75 U/L (ref 38–126)
Anion gap: 11 (ref 5–15)
BUN: 20 mg/dL (ref 8–23)
CO2: 21 mmol/L — ABNORMAL LOW (ref 22–32)
Calcium: 9.2 mg/dL (ref 8.9–10.3)
Chloride: 98 mmol/L (ref 98–111)
Creatinine, Ser: 0.61 mg/dL (ref 0.44–1.00)
GFR, Estimated: >60 mL/min (ref >60)
Glucose, Bld: 177 mg/dL — ABNORMAL HIGH (ref 70–99)
Potassium: 3.5 mmol/L (ref 3.5–5.1)
Sodium: 130 mmol/L — ABNORMAL LOW (ref 135–145)
Total Bilirubin: 0.5 mg/dL (ref 0.0–1.2)
Total Protein: 6.1 g/dL — ABNORMAL LOW (ref 6.5–8.1)

## 2024-02-04 LAB — URINALYSIS, MICROSCOPIC (REFLEX)
RBC / HPF: NONE SEEN RBC/hpf (ref 0–5)
WBC, UA: NONE SEEN WBC/hpf (ref 0–5)

## 2024-02-04 LAB — LIPASE, BLOOD: Lipase: 19 U/L (ref 11–51)

## 2024-02-04 MED ORDER — DOCUSATE SODIUM 100 MG PO CAPS
100.0000 mg | ORAL_CAPSULE | Freq: Every day | ORAL | Status: DC
Start: 2024-02-04 — End: 2024-02-08
  Administered 2024-02-04 – 2024-02-07 (×3): 100 mg via ORAL
  Filled 2024-02-04 (×3): qty 1

## 2024-02-04 MED ORDER — ONDANSETRON HCL 4 MG PO TABS: 4.0000 mg | ORAL_TABLET | Freq: Four times a day (QID) | ORAL | Status: DC | PRN

## 2024-02-04 MED ORDER — PANCRELIPASE (LIP-PROT-AMYL) 36000-114000 UNITS PO CPEP
36000.0000 [IU] | ORAL_CAPSULE | Freq: Two times a day (BID) | ORAL | Status: DC
  Administered 2024-02-06 – 2024-02-13 (×14): 36000 [IU] via ORAL
  Filled 2024-02-04 (×15): qty 1

## 2024-02-04 MED ORDER — IOHEXOL 300 MG/ML  SOLN
100.0000 mL | Freq: Once | INTRAMUSCULAR | Status: AC | PRN
  Administered 2024-02-04: 100 mL via INTRAVENOUS

## 2024-02-04 MED ORDER — FENTANYL CITRATE PF 50 MCG/ML IJ SOSY
50.0000 ug | PREFILLED_SYRINGE | Freq: Once | INTRAMUSCULAR | Status: AC
  Administered 2024-02-04: 50 ug via INTRAVENOUS
  Filled 2024-02-04: qty 1

## 2024-02-04 MED ORDER — DEXTROSE IN LACTATED RINGERS 5 % IV SOLN: INTRAVENOUS | Status: AC

## 2024-02-04 MED ORDER — LINACLOTIDE 145 MCG PO CAPS
145.0000 ug | ORAL_CAPSULE | Freq: Every day | ORAL | Status: DC
  Administered 2024-02-07: 145 ug via ORAL
  Filled 2024-02-04 (×3): qty 1

## 2024-02-04 MED ORDER — PANTOPRAZOLE SODIUM 40 MG PO TBEC
40.0000 mg | DELAYED_RELEASE_TABLET | Freq: Every day | ORAL | Status: DC
  Administered 2024-02-06 – 2024-02-13 (×8): 40 mg via ORAL
  Filled 2024-02-04 (×9): qty 1

## 2024-02-04 MED ORDER — SUCRALFATE 1 G PO TABS
1.0000 g | ORAL_TABLET | Freq: Two times a day (BID) | ORAL | Status: DC
  Administered 2024-02-04 – 2024-02-20 (×27): 1 g via ORAL
  Filled 2024-02-04 (×27): qty 1

## 2024-02-04 MED ORDER — RIVAROXABAN 20 MG PO TABS: 20.0000 mg | ORAL_TABLET | Freq: Every day | ORAL | Status: DC

## 2024-02-04 MED ORDER — MORPHINE SULFATE (PF) 4 MG/ML IV SOLN
4.0000 mg | Freq: Once | INTRAVENOUS | Status: AC
  Administered 2024-02-04: 4 mg via INTRAVENOUS
  Filled 2024-02-04: qty 1

## 2024-02-04 MED ORDER — MORPHINE SULFATE ER 30 MG PO TBCR
30.0000 mg | EXTENDED_RELEASE_TABLET | Freq: Two times a day (BID) | ORAL | Status: DC
  Administered 2024-02-04 – 2024-02-12 (×15): 30 mg via ORAL
  Filled 2024-02-04 (×15): qty 1

## 2024-02-04 MED ORDER — ONDANSETRON HCL 4 MG/2ML IJ SOLN
4.0000 mg | Freq: Four times a day (QID) | INTRAMUSCULAR | Status: DC | PRN
  Administered 2024-02-11 – 2024-02-13 (×4): 4 mg via INTRAVENOUS
  Filled 2024-02-04 (×4): qty 2

## 2024-02-04 MED ORDER — HYDROMORPHONE HCL 1 MG/ML IJ SOLN
1.0000 mg | INTRAMUSCULAR | Status: DC | PRN
  Administered 2024-02-06 – 2024-02-12 (×9): 1 mg via INTRAVENOUS
  Filled 2024-02-04 (×9): qty 1

## 2024-02-04 MED ORDER — ONDANSETRON HCL 4 MG/2ML IJ SOLN
4.0000 mg | Freq: Once | INTRAMUSCULAR | Status: AC
  Administered 2024-02-04: 4 mg via INTRAVENOUS
  Filled 2024-02-04: qty 2

## 2024-02-04 MED ORDER — MIRTAZAPINE 15 MG PO TABS
15.0000 mg | ORAL_TABLET | Freq: Every day | ORAL | Status: DC
  Administered 2024-02-04 – 2024-02-13 (×10): 15 mg via ORAL
  Filled 2024-02-04 (×11): qty 1

## 2024-02-04 MED ORDER — DOCUSATE SODIUM 100 MG PO CAPS
200.0000 mg | ORAL_CAPSULE | Freq: Every day | ORAL | Status: DC
  Administered 2024-02-07: 200 mg via ORAL
  Filled 2024-02-04 (×2): qty 2

## 2024-02-04 MED ORDER — IRBESARTAN 75 MG PO TABS
75.0000 mg | ORAL_TABLET | Freq: Every day | ORAL | Status: DC
  Administered 2024-02-06 – 2024-02-13 (×8): 75 mg via ORAL
  Filled 2024-02-04 (×8): qty 1

## 2024-02-04 MED ORDER — EMPAGLIFLOZIN 25 MG PO TABS
25.0000 mg | ORAL_TABLET | Freq: Every day | ORAL | Status: DC
  Administered 2024-02-06: 25 mg via ORAL
  Filled 2024-02-04 (×3): qty 1

## 2024-02-04 MED ORDER — LATANOPROST 0.005 % OP SOLN
1.0000 [drp] | Freq: Every day | OPHTHALMIC | Status: DC
  Administered 2024-02-04 – 2024-02-19 (×16): 1 [drp] via OPHTHALMIC
  Filled 2024-02-04: qty 2.5

## 2024-02-04 MED ORDER — SODIUM CHLORIDE 0.9 % IV BOLUS
1000.0000 mL | Freq: Once | INTRAVENOUS | Status: AC
  Administered 2024-02-04: 1000 mL via INTRAVENOUS

## 2024-02-04 NOTE — ED Triage Notes (Signed)
 Pt c/o abdominal pain & poor appetite. Pt is due for ERCP in March.

## 2024-02-04 NOTE — H&P (Signed)
 History and Physical    Patient: Sherry Holloway RUE:[id number] DOB: [birth year] DOA: 02/04/2024 DOS: the patient was seen and examined on 02/04/2024 PCP: Donato Schultz, DO  Patient coming from: Home  Chief Complaint:  Chief Complaint  Patient presents with   Abdominal Pain   HPI: Sherry Holloway is a 89 y.o. female with medical history significant of essential hypertension, hyperlipidemia, GERD, esophageal cancer, diabetes, obstructive sleep apnea, biliary stones status post biliary stent placement back in November last year that presented to the ER with some abdominal pain.  Abdominal pain has been on and off since mid January.  Patient was recently seen in the January where workup was done and discharged home because findings with stable.  Pain has continued to persist.  Patient had about 15 pounds unintentional weight loss severe abdominal pain and right upper quadrant pain into the brain to the right lower quadrant as well as across.  Patient has been having significant nausea but no vomiting.  She also has loss of taste.  Patient has had recent diarrhea.  Also noted with very yellow stools.  Work of the ER suggested small bowel obstruction.  There is also suspicion for biliary colic.  General surgery Dr. Carolynne Edouard was consulted by ER.  Recommended admission to medical service and they will follow.  Review of Systems: As mentioned in the history of present illness. All other systems reviewed and are negative. Past Medical History:  Diagnosis Date   Arthritis    Choledocholithiasis    Diabetes mellitus without complication (HCC)    Diverticulitis    Esophageal cancer (HCC) dx'd 2020   GERD (gastroesophageal reflux disease)    Hyperlipidemia    Hypertension    OSA (obstructive sleep apnea)    Urine incontinence    Uterine cancer (HCC)    Past Surgical History:  Procedure Laterality Date   ABDOMINAL HYSTERECTOMY  1972   APPENDECTOMY     BALLOON DILATION N/A 10/29/2023    Procedure: Billiary BALLOON DILATION;  Surgeon: Lemar Lofty., MD;  Location: [facility];  Service: Gastroenterology;  Laterality: N/A;   BILIARY DILATION  03/05/2021   Procedure: BILIARY DILATION;  Surgeon: Meridee Score Netty Starring., MD;  Location: [facility];  Service: Gastroenterology;;   BILIARY DILATION  11/27/2021   Procedure: BILIARY DILATION;  Surgeon: Rachael Fee, MD;  Location: [facility] ENDOSCOPY;  Service: Endoscopy;;   BILIARY DILATION  02/11/2022   Procedure: BILIARY DILATION;  Surgeon: Lemar Lofty., MD;  Location: [facility];  Service: Gastroenterology;;   BILIARY STENT PLACEMENT  11/27/2021   Procedure: BILIARY STENT PLACEMENT;  Surgeon: Rachael Fee, MD;  Location: [facility] ENDOSCOPY;  Service: Endoscopy;;   BILIARY STENT PLACEMENT N/A 10/29/2023   Procedure: BILIARY STENT PLACEMENT;  Surgeon: Lemar Lofty., MD;  Location: [facility];  Service: Gastroenterology;  Laterality: N/A;   BIOPSY  03/05/2021   Procedure: BIOPSY;  Surgeon: Lemar Lofty., MD;  Location: [facility];  Service: Gastroenterology;;   BIOPSY  11/27/2021   Procedure: BIOPSY;  Surgeon: Rachael Fee, MD;  Location: [facility] ENDOSCOPY;  Service: Endoscopy;;   BIOPSY  10/29/2023   Procedure: BIOPSY;  Surgeon: Lemar Lofty., MD;  Location: [facility];  Service: Gastroenterology;;   CHOLECYSTECTOMY     ENDOSCOPIC RETROGRADE CHOLANGIOPANCREATOGRAPHY (ERCP) WITH PROPOFOL N/A 03/05/2021   Procedure: ENDOSCOPIC RETROGRADE CHOLANGIOPANCREATOGRAPHY (ERCP) WITH PROPOFOL;  Surgeon: Lemar Lofty., MD;  Location: [facility];  Service: Gastroenterology;  Laterality: N/A;   ENDOSCOPIC RETROGRADE CHOLANGIOPANCREATOGRAPHY (ERCP)  WITH PROPOFOL N/A 11/27/2021   Procedure: ENDOSCOPIC RETROGRADE CHOLANGIOPANCREATOGRAPHY (ERCP) WITH PROPOFOL;  Surgeon: Rachael Fee, MD;  Location: [facility] ENDOSCOPY;  Service: Endoscopy;  Laterality: N/A;  possible EGD prior   ENDOSCOPIC  RETROGRADE CHOLANGIOPANCREATOGRAPHY (ERCP) WITH PROPOFOL N/A 02/11/2022   Procedure: ENDOSCOPIC RETROGRADE CHOLANGIOPANCREATOGRAPHY (ERCP) WITH PROPOFOL;  Surgeon: Meridee Score Netty Starring., MD;  Location: [facility];  Service: Gastroenterology;  Laterality: N/A;   ERCP N/A 10/29/2023   Procedure: ENDOSCOPIC RETROGRADE CHOLANGIOPANCREATOGRAPHY (ERCP);  Surgeon: Lemar Lofty., MD;  Location: [facility];  Service: Gastroenterology;  Laterality: N/A;   ESOPHAGOGASTRODUODENOSCOPY N/A 11/27/2021   Procedure: ESOPHAGOGASTRODUODENOSCOPY (EGD);  Surgeon: Rachael Fee, MD;  Location: [facility] ENDOSCOPY;  Service: Endoscopy;  Laterality: N/A;   ESOPHAGOGASTRODUODENOSCOPY N/A 10/29/2023   Procedure: ESOPHAGOGASTRODUODENOSCOPY (EGD);  Surgeon: Lemar Lofty., MD;  Location: [facility];  Service: Gastroenterology;  Laterality: N/A;   ESOPHAGOGASTRODUODENOSCOPY (EGD) WITH PROPOFOL N/A 03/05/2021   Procedure: ESOPHAGOGASTRODUODENOSCOPY (EGD) WITH PROPOFOL;  Surgeon: Meridee Score Netty Starring., MD;  Location: [facility];  Service: Gastroenterology;  Laterality: N/A;   EUS N/A 03/05/2021   Procedure: UPPER ENDOSCOPIC ULTRASOUND (EUS) RADIAL;  Surgeon: Meridee Score Netty Starring., MD;  Location: [facility];  Service: Gastroenterology;  Laterality: N/A;   LITHOTRIPSY  02/11/2022   Procedure: LITHOTRIPSY;  Surgeon: Meridee Score Netty Starring., MD;  Location: [facility]COPY;  Service: Gastroenterology;;   LITHOTRIPSY  10/29/2023   Procedure: LITHOTRIPSY;  Surgeon: Meridee Score Netty Starring., MD;  Location: [facility];  Service: Gastroenterology;;   LUMBAR LAMINECTOMY     REMOVAL OF STONES  03/05/2021   Procedure: REMOVAL OF STONES;  Surgeon: Lemar Lofty., MD;  Location: [facility];  Service: Gastroenterology;;   REMOVAL OF STONES  11/27/2021   Procedure: REMOVAL OF STONES;  Surgeon: Rachael Fee, MD;  Location: [facility] ENDOSCOPY;  Service: Endoscopy;;   REMOVAL OF STONES  02/11/2022    Procedure: REMOVAL OF STONES;  Surgeon: Lemar Lofty., MD;  Location: [facility]OSCOPY;  Service: Gastroenterology;;   REMOVAL OF STONES  10/29/2023   Procedure: REMOVAL OF STONES;  Surgeon: Lemar Lofty., MD;  Location: [facility]OSCOPY;  Service: Gastroenterology;;   SPINAL CORD STIMULATOR IMPLANT     Not active any more.   SPYGLASS CHOLANGIOSCOPY N/A 03/05/2021   Procedure: SPYGLASS CHOLANGIOSCOPY;  Surgeon: Lemar Lofty., MD;  Location: [facility]COPY;  Service: Gastroenterology;  Laterality: N/A;   SPYGLASS CHOLANGIOSCOPY N/A 02/11/2022   Procedure: SPYGLASS CHOLANGIOSCOPY;  Surgeon: Lemar Lofty., MD;  Location: [facility];  Service: Gastroenterology;  Laterality: N/A;   SPYGLASS LITHOTRIPSY N/A 03/05/2021   Procedure: ZOXWRUEA LITHOTRIPSY;  Surgeon: Lemar Lofty., MD;  Location: [facility];  Service: Gastroenterology;  Laterality: N/A;   SPYGLASS LITHOTRIPSY N/A 02/11/2022   Procedure: [id number] LITHOTRIPSY;  Surgeon: Lemar Lofty., MD;  Location: [facility];  Service: Gastroenterology;  Laterality: N/A;   STENT REMOVAL  02/11/2022   Procedure: STENT REMOVAL;  Surgeon: Meridee Score Netty Starring., MD;  Location: [facility]OSCOPY;  Service: Gastroenterology;;   TONSILLECTOMY     TOTAL KNEE ARTHROPLASTY Right    Social History:  reports that she quit smoking about 35 years ago. Her smoking use included cigarettes. She started smoking about 70 years ago. She has a 35 pack-year smoking history. She has never used smokeless tobacco. She reports that she does not currently use alcohol. She reports that she does not use drugs.  Allergies  Allergen Reactions   Gramicidin Other (See Comments)    Gramicidin is an antibiotic peptide that is used  to treat local infections caused by gram-positive bacteria. Unknown reaction   Sulfa Antibiotics Rash   Wound Dressing Adhesive Rash and Other (See Comments)    Some dressings break out the skin     Family History  Problem Relation Age of Onset   Diabetes Mother    Heart disease Father    Lung cancer Sister    Hypertension Daughter    Diabetes Daughter    Hypertension Daughter    Lung cancer Son    Hypertension Son    Diabetes Son    Hypertension Son    Colon cancer Neg Hx    Esophageal cancer Neg Hx    Inflammatory bowel disease Neg Hx    Liver disease Neg Hx    Pancreatic cancer Neg Hx    Rectal cancer Neg Hx    Stomach cancer Neg Hx     Prior to Admission medications   Medication Sig Start Date End Date Taking? Authorizing Provider  XARELTO 20 MG TABS tablet TAKE 1 TABLET DAILY WITH SUPPER, START ONLY AFTER THE STARTER PACK HAS BEEN COMPLETED Patient taking differently: Take 20 mg by mouth daily with supper. 10/23/23  Yes Seabron Spates R, DO  bumetanide (BUMEX) 1 MG tablet Take 0.5-1 tablets (0.5-1 mg total) by mouth daily as needed (swelling). Patient not taking: Reported on 01/15/2024 10/31/23   Rolly Salter, MD  CREON [phone] units CPEP capsule TAKE 1 CAPSULE THREE TIMES A DAY BEFORE MEALS Patient taking differently: Take 36,000 Units by mouth See admin instructions. Take 36,000 units by mouth before breakfast and supper/evening meal 05/05/23   Zola Button, Grayling Congress, DO  docusate sodium (COLACE) 100 MG capsule Take 1 capsule (100 mg total) by mouth 2 (two) times daily. Patient taking differently: Take 100-200 mg by mouth See admin instructions. Take 200 mg by mouth in the morning and 100 mg in the evening 03/18/21   Osvaldo Shipper, MD  famciclovir [facility]) 125 MG tablet TAKE 1 TABLET DAILY 03/31/23   Zola Button, Myrene Buddy R, DO  glucose blood (FREESTYLE LITE) test strip CHECK BLOOD SUGAR TWICE A DAY 11/20/22   Zola Button, Yvonne R, DO  JARDIANCE 25 MG TABS tablet TAKE 1 TABLET DAILY BEFORE BREAKFAST 01/30/24   Zola Button, Grayling Congress, DO  latanoprost (XALATAN) 0.005 % ophthalmic solution Place 1 drop into both eyes at bedtime. 08/24/21   Donato Schultz, DO   LINZESS 145 MCG CAPS capsule TAKE 1 CAPSULE DAILY BEFORE BREAKFAST 01/08/24   Zola Button, Myrene Buddy R, DO  mirtazapine (REMERON) 15 MG tablet TAKE 1 TABLET AT BEDTIME 04/16/23   Zola Button, Grayling Congress, DO  morphine (MS CONTIN) 30 MG 12 hr tablet Take 30 mg by mouth every 12 (twelve) hours. 01/21/22   [provider]  Multiple Vitamin (MULTIVITAMIN WITH MINERALS) TABS tablet Take 1 tablet by mouth daily with supper.    [provider]  omeprazole (PRILOSEC) 40 MG capsule TAKE 1 CAPSULE IN THE MORNING AND AT BEDTIME 01/20/24   Mansouraty, Netty Starring., MD  ondansetron (ZOFRAN-ODT) 4 MG disintegrating tablet Take 1 tablet (4 mg total) by mouth every 8 (eight) hours as needed for nausea or vomiting. Patient not taking: Reported on 01/15/2024 01/13/24   Terrilee Files, MD  senna-docusate (SENNA S) 8.6-50 MG tablet Take 1 tablet by mouth See admin instructions. Take 1 tablet by mouth in the morning and evening    [provider]  sucralfate (CARAFATE) 1 g tablet Take 1 tablet (  1 g total) by mouth 2 (two) times daily. 01/28/24   Mansouraty, Netty Starring., MD  TYLENOL 500 MG tablet Take 500-1,000 mg by mouth every 6 (six) hours as needed for mild pain (pain score 1-3) or headache.    [provider]  valsartan (DIOVAN) 40 MG tablet Take 1 tablet (40 mg total) by mouth daily. 01/19/24   Donato Schultz, DO    Physical Exam: Vitals:   02/04/24 1503 02/04/24 1505 02/04/24 1928 02/04/24 2059  BP:  (!) 171/57 (!) 164/48 (!) 172/55  Pulse:  69 68 65  Resp:  18 20 20   Temp:  98.9 F (37.2 C) 98.4 F (36.9 C) 98.3 F (36.8 C)  TempSrc:  Oral Oral Oral  SpO2:  98% 97%   Weight: 59 kg     Height: 5\' 4"  (1.626 m)      Constitutional: NAD, calm, comfortable Eyes: PERRL, lids and conjunctivae normal ENMT: Mucous membranes are moist. Posterior pharynx clear of any exudate or lesions.Normal dentition.  Neck: normal, supple, no masses, no thyromegaly Respiratory: clear to  auscultation bilaterally, no wheezing, no crackles. Normal respiratory effort. No accessory muscle use.  Cardiovascular: Regular rate and rhythm, no murmurs / rubs / gallops. No extremity edema. 2+ pedal pulses. No carotid bruits.  Abdomen: Mildly distended and tender no masses palpated. No hepatosplenomegaly. Bowel sounds positive.  Musculoskeletal: Good range of motion, no joint swelling or tenderness, Skin: no rashes, lesions, ulcers. No induration Neurologic: CN 2-12 grossly intact. Sensation intact, DTR normal. Strength 5/5 in all 4.  Psychiatric: Normal judgment and insight. Alert and oriented x 3. Normal mood  Data Reviewed:  Blood pressure was 71/57, sodium 130, CO2 21, albumin 2.8 white count 17.0, urinalysis is negative.  CT abdomen pelvis shows small bowel obstruction with transition point in the right anatomic pelvis, liver appears mildly static with chronic intrahepatic and extrahepatic biliary ductal dilatation with 2 biliary stents in place.  Also chronic pancreatic ductal dilatation with chronic calcific pancreatitis.  Assessment and Plan:  #1 possible small bowel obstruction: Patient has no vomiting.  Just some nausea.  No significant distention.  Patient will be admitted.  Initial bowel rest.  No NG tube for now.  Pain control, hydration.  Await return of full bowel function.  #2 chronic calcific pancreatitis: May be because of nausea abdominal pain.  Pain control and supportive care  #3 chronic biliary obstruction.:  Possible biliary colic.  Continue pain and supportive care.  #4 GERD: Resume PPIs when able  #5 hyperlipidemia: Resume home regimen.  #6 history of esophageal cancer: In remission.  Continue outpatient oncology follow-up  #7 type 2 diabetes: Initiate sliding scale insulin while in the hospital.  #8 chronic pain syndrome: Continue chronic pain management.    Advance Care Planning:   Code Status: Limited: Do not attempt resuscitation (DNR) -DNR-LIMITED -Do  Not Intubate/DNI    Consults: Dr. Carolynne Edouard: General surgery  Family Communication: No family at bedside  Severity of Illness: The appropriate patient status for this patient is INPATIENT. Inpatient status is judged to be reasonable and necessary in order to provide the required intensity of service to ensure the patient's safety. The patient's presenting symptoms, physical exam findings, and initial radiographic and laboratory data in the context of their chronic comorbidities is felt to place them at high risk for further clinical deterioration. Furthermore, it is not anticipated that the patient will be medically stable for discharge from the hospital within 2 midnights of admission.   *  I certify that at the point of admission it is my clinical judgment that the patient will require inpatient hospital care spanning beyond 2 midnights from the point of admission due to high intensity of service, high risk for further deterioration and high frequency of surveillance required.*  AuthorLonia Blood, MD 02/04/2024 10:20 PM  For on call review [email or web address].

## 2024-02-04 NOTE — ED Provider Notes (Signed)
 Sherry Holloway Provider Note   CSN: [id number] Arrival date & time: 02/04/24  1458     History  Chief Complaint  Patient presents with   Abdominal Pain    Sherry Holloway is a 89 y.o. female.   Abdominal Pain 89 year old female history of diabetes, GERD, hypertension, hyperlipidemia, esophageal cancer, IPMN and biliary stones status post biliary stent placement most recently in November 2024 presenting for abdominal pain.  She has had abdominal pain since mid January.  She was seen in the ED January 28 and had reassuring workup and was discharged home.  Her pain has persisted.  She has had over 15 pounds unintentional weight loss, severe abdominal pain right upper quadrant into her right lower quadrant.  She has had nausea but no vomiting.  Very poor p.o. tolerance.  She has yellow stools.  Some diarrhea few days ago but none since.  No blood or constipation.  No melena.  No chest pain or shortness of breath or fevers.  No urinary symptoms.     Home Medications Prior to Admission medications   Medication Sig Start Date End Date Taking? Authorizing Provider  bumetanide (BUMEX) 1 MG tablet Take 0.5-1 tablets (0.5-1 mg total) by mouth daily as needed (swelling). Patient not taking: Reported on 01/15/2024 10/31/23   Rolly Salter, MD  CREON [phone] units CPEP capsule TAKE 1 CAPSULE THREE TIMES A DAY BEFORE MEALS Patient taking differently: Take 36,000 Units by mouth See admin instructions. Take 36,000 units by mouth before breakfast and supper/evening meal 05/05/23   Zola Button, Grayling Congress, DO  docusate sodium (COLACE) 100 MG capsule Take 1 capsule (100 mg total) by mouth 2 (two) times daily. Patient taking differently: Take 100-200 mg by mouth See admin instructions. Take 200 mg by mouth in the morning and 100 mg in the evening 03/18/21   Osvaldo Shipper, MD  famciclovir [facility]) 125 MG tablet TAKE 1 TABLET DAILY 03/31/23   Zola Button, Myrene Buddy  R, DO  glucose blood (FREESTYLE LITE) test strip CHECK BLOOD SUGAR TWICE A DAY 11/20/22   Zola Button, Yvonne R, DO  JARDIANCE 25 MG TABS tablet TAKE 1 TABLET DAILY BEFORE BREAKFAST 01/30/24   Zola Button, Grayling Congress, DO  latanoprost (XALATAN) 0.005 % ophthalmic solution Place 1 drop into both eyes at bedtime. 08/24/21   Donato Schultz, DO  LINZESS 145 MCG CAPS capsule TAKE 1 CAPSULE DAILY BEFORE BREAKFAST 01/08/24   Zola Button, Myrene Buddy R, DO  mirtazapine (REMERON) 15 MG tablet TAKE 1 TABLET AT BEDTIME 04/16/23   Zola Button, Grayling Congress, DO  morphine (MS CONTIN) 30 MG 12 hr tablet Take 30 mg by mouth every 12 (twelve) hours. 01/21/22   [provider]  Multiple Vitamin (MULTIVITAMIN WITH MINERALS) TABS tablet Take 1 tablet by mouth daily with supper.    [provider]  omeprazole (PRILOSEC) 40 MG capsule TAKE 1 CAPSULE IN THE MORNING AND AT BEDTIME 01/20/24   Mansouraty, Netty Starring., MD  ondansetron (ZOFRAN-ODT) 4 MG disintegrating tablet Take 1 tablet (4 mg total) by mouth every 8 (eight) hours as needed for nausea or vomiting. Patient not taking: Reported on 01/15/2024 01/13/24   Terrilee Files, MD  senna-docusate (SENNA S) 8.6-50 MG tablet Take 1 tablet by mouth See admin instructions. Take 1 tablet by mouth in the morning and evening    [provider]  sucralfate (CARAFATE) 1 g tablet Take 1 tablet (1 g total) by mouth  2 (two) times daily. 01/28/24   Mansouraty, Netty Starring., MD  TYLENOL 500 MG tablet Take 500-1,000 mg by mouth every 6 (six) hours as needed for mild pain (pain score 1-3) or headache.    [provider]  valsartan (DIOVAN) 40 MG tablet Take 1 tablet (40 mg total) by mouth daily. 01/19/24   Lowne [name], Yvonne R, DO  XARELTO 20 MG TABS tablet TAKE 1 TABLET DAILY WITH SUPPER, START ONLY AFTER THE STARTER PACK HAS BEEN COMPLETED Patient taking differently: Take 20 mg by mouth daily with supper. 10/23/23   Donato Schultz, DO      Allergies     Gramicidin, Sulfa antibiotics, and Wound dressing adhesive    Review of Systems   Review of Systems  Gastrointestinal:  Positive for abdominal pain.  Review of systems completed and notable as per HPI.  ROS otherwise negative.   Physical Exam Updated Vital Signs BP (!) 171/57 (BP Location: Right Arm)   Pulse 69   Temp 98.9 F (37.2 C) (Oral)   Resp 18   Ht 5\' 4"  (1.626 m)   Wt 59 kg   SpO2 98%   BMI 22.31 kg/m  Physical Exam Vitals and nursing note reviewed.  Constitutional:      General: She is not in acute distress.    Appearance: She is well-developed.  HENT:     Head: Normocephalic and atraumatic.  Eyes:     Conjunctiva/sclera: Conjunctivae normal.  Cardiovascular:     Rate and Rhythm: Normal rate and regular rhythm.     Heart sounds: Normal heart sounds. No murmur heard. Pulmonary:     Effort: Pulmonary effort is normal. No respiratory distress.     Breath sounds: Normal breath sounds.  Abdominal:     Palpations: Abdomen is soft.     Tenderness: There is abdominal tenderness in the right upper quadrant and right lower quadrant. There is no right CVA tenderness, left CVA tenderness, guarding or rebound.  Musculoskeletal:        General: No swelling.     Cervical back: Neck supple.  Skin:    General: Skin is warm and dry.     Capillary Refill: Capillary refill takes less than 2 seconds.  Neurological:     Mental Status: She is alert.  Psychiatric:        Mood and Affect: Mood normal.     ED Results / Procedures / Treatments   Labs (all labs ordered are listed, but only abnormal results are displayed) Labs Reviewed  COMPREHENSIVE METABOLIC PANEL - Abnormal; Notable for the following components:      Result Value   Sodium 130 (*)    CO2 21 (*)    Glucose, Bld 177 (*)    Total Protein 6.1 (*)    Albumin 2.8 (*)    AST 10 (*)    All other components within normal limits  CBC - Abnormal; Notable for the following components:   WBC 17.0 (*)    All  other components within normal limits  URINALYSIS, ROUTINE W REFLEX MICROSCOPIC - Abnormal; Notable for the following components:   Glucose, UA >=500 (*)    Ketones, ur 40 (*)    Protein, ur 100 (*)    All other components within normal limits  URINALYSIS, MICROSCOPIC (REFLEX) - Abnormal; Notable for the following components:   Bacteria, UA RARE (*)    All other components within normal limits  LIPASE, BLOOD    EKG EKG Interpretation  Date/Time:  Wednesday February 04 2024 15:52:16 EST Ventricular Rate:  66 PR Interval:  161 QRS Duration:  142 QT Interval:  444 QTC Calculation: 466 R Axis:   -48  Text Interpretation: Sinus rhythm Multiple premature complexes, vent & supraven RBBB and LAFB Probable left ventricular hypertrophy Confirmed by Fulton Reek [phone]) on 02/04/2024 3:54:02 PM  Radiology CT ABDOMEN PELVIS W CONTRAST Result Date: 02/04/2024 CLINICAL DATA:  Right upper quadrant pain. EXAM: CT ABDOMEN AND PELVIS WITH CONTRAST TECHNIQUE: Multidetector CT imaging of the abdomen and pelvis was performed using the standard protocol following bolus administration of intravenous contrast. RADIATION DOSE REDUCTION: This exam was performed according to the departmental dose-optimization program which includes automated exposure control, adjustment of the mA and/or kV according to patient size and/or use of iterative reconstruction technique. CONTRAST:  OMNIPAQUE IOHEXOL 300 MG/ML  SOLN COMPARISON:  01/13/2024. FINDINGS: Lower chest: Scarring in the lung bases. Heart is at the upper limits of normal in size. No pericardial or pleural effusion. Atherosclerotic calcification of the aorta. Distal esophagus is grossly unremarkable. Hepatobiliary: Extensive pneumobilia with biliary stents in place, unchanged from 01/13/2024. Liver is slightly decreased in attenuation diffusely with probable scattered hyperdense perfusion anomalies. Cholecystectomy. Pancreas: Marked pancreatic ductal  dilatation, 10 mm, stable. Pancreatic atrophy and calcifications Spleen: Negative. Adrenals/Urinary Tract: Adrenal glands and kidneys are unremarkable. Ureters are decompressed. Bladder is grossly unremarkable. Stomach/Bowel: Dilated stomach, duodenum and jejunum with an apparent transition Holloway in the right anatomic pelvis (301/49). Remainder of the small bowel is decompressed. Colon is grossly unremarkable. Vascular/Lymphatic: Atherosclerotic calcification of the aorta. No pathologically enlarged lymph nodes. Reproductive: Hysterectomy.  No adnexal mass. Other: No free fluid.  Mesenteries and peritoneum are unremarkable. Musculoskeletal: Left hip arthroplasty. Osteopenia. Degenerative changes in the spine. Spinal stimulator wires. Dextroconvex scoliosis. IMPRESSION: 1. Small-bowel obstruction with transition Holloway in the right anatomic pelvis. 2. Liver appears mildly steatotic. 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 3. Chronic pancreatic ductal dilatation. Chronic calcific pancreatitis. 4.  Aortic atherosclerosis (ICD10-I70.0). Electronically Signed   By: Leanna Battles M.D.   On: 02/04/2024 17:07    Procedures Procedures    Medications Ordered in ED Medications  sodium chloride 0.9 % bolus 1,000 mL (0 mLs Intravenous Stopped 02/04/24 1757)  fentaNYL (SUBLIMAZE) injection 50 mcg (50 mcg Intravenous Given 02/04/24 1602)  ondansetron (ZOFRAN) injection 4 mg (4 mg Intravenous Given 02/04/24 1600)  iohexol (OMNIPAQUE) 300 MG/ML solution 100 mL (100 mLs Intravenous Contrast Given 02/04/24 1644)  morphine (PF) 4 MG/ML injection 4 mg (4 mg Intravenous Given 02/04/24 1825)    ED Course/ Medical Decision Making/ A&P Clinical Course as of 02/04/24 1827  Wed Feb 04, 2024  1727 Dr. Carolynne Edouard surgery: medicine admit, surgery to consult [JD]  1826 Discussed with hospitalist for admission. [JD]    Clinical Course User Index [JD] Laurence Spates, MD                                  Medical Decision Making Amount and/or Complexity of Data Reviewed Labs: ordered. Radiology: ordered.  Risk Prescription drug management. Decision regarding hospitalization.   Medical Decision Making:   BRITTINIE WHERLEY is a 89 y.o. female who presented to the ED today with abdominal pain.  Vital signs reviewed.  On exam she is thin.  She has had p.o. intolerance as well as unintentional weight loss due to the belly  pain.  She is quite tender but not peritonitic.  I am concern for possible biliary obstruction, cholangitis, cholecystitis, appendicitis.  No chest pain or shortness of breath lower suspicion for ACS, PE.  Her lab work is notable for worsening of her leukocytosis, as well as sodium 130 as well as low albumin likely due to malnutrition.  Her transaminases and bilirubin are actually normal.  Kidney function looks normal as well.   Patient placed on continuous vitals and telemetry monitoring while in ED which was reviewed periodically.  Reviewed and confirmed nursing documentation for past medical history, family history, social history.  Reassessment and Plan:   CT scan notable for obstruction.  On reassessment she feeling somewhat better.  She is intermittently passing gas and had a bowel move today.  No vomiting here but is somewhat nauseous.  Additional pain medication ordered.  No signs of biliary obstruction.  She has leukocytosis but no focal signs of infection.  Discussed with Dr. Carolynne Edouard with general surgery who feels she would be best served by medicine admission and surgery will consult.  Discussed with hospitalist Dr. Mikeal Hawthorne for admission.  Patient updated on plan.   Patient's presentation is most consistent with acute complicated illness / injury requiring diagnostic workup.           Final Clinical Impression(s) / ED Diagnoses Final diagnoses:  SBO (small bowel obstruction) (HCC)    Rx / DC Orders ED Discharge Orders     None          Laurence Spates, MD 02/04/24 1827

## 2024-02-05 ENCOUNTER — Inpatient Hospital Stay (HOSPITAL_COMMUNITY): Payer: Medicare Other

## 2024-02-05 DIAGNOSIS — K56609 Unspecified intestinal obstruction, unspecified as to partial versus complete obstruction: Secondary | ICD-10-CM | POA: Diagnosis not present

## 2024-02-05 LAB — COMPREHENSIVE METABOLIC PANEL
ALT: 13 U/L (ref 0–44)
AST: 9 U/L — ABNORMAL LOW (ref 15–41)
Albumin: 2.3 g/dL — ABNORMAL LOW (ref 3.5–5.0)
Alkaline Phosphatase: 59 U/L (ref 38–126)
Anion gap: 11 (ref 5–15)
BUN: 15 mg/dL (ref 8–23)
CO2: 21 mmol/L — ABNORMAL LOW (ref 22–32)
Calcium: 8.6 mg/dL — ABNORMAL LOW (ref 8.9–10.3)
Chloride: 101 mmol/L (ref 98–111)
Creatinine, Ser: 0.55 mg/dL (ref 0.44–1.00)
GFR, Estimated: >60 mL/min (ref >60)
Glucose, Bld: 131 mg/dL — ABNORMAL HIGH (ref 70–99)
Potassium: 3.3 mmol/L — ABNORMAL LOW (ref 3.5–5.1)
Sodium: 133 mmol/L — ABNORMAL LOW (ref 135–145)
Total Bilirubin: 0.5 mg/dL (ref 0.0–1.2)
Total Protein: 5 g/dL — ABNORMAL LOW (ref 6.5–8.1)

## 2024-02-05 LAB — CBC
HCT: 34.3 % — ABNORMAL LOW (ref 36.0–46.0)
Hemoglobin: 10.8 g/dL — ABNORMAL LOW (ref 12.0–15.0)
MCH: 27.6 pg (ref 26.0–34.0)
MCHC: 31.5 g/dL (ref 30.0–36.0)
MCV: 87.7 fL (ref 80.0–100.0)
Platelets: 210 10*3/uL (ref 150–400)
RBC: 3.91 MIL/uL (ref 3.87–5.11)
RDW: 14.1 % (ref 11.5–15.5)
WBC: 11.8 10*3/uL — ABNORMAL HIGH (ref 4.0–10.5)
nRBC: 0 % (ref 0.0–0.2)

## 2024-02-05 MED ORDER — HYDRALAZINE HCL 20 MG/ML IJ SOLN
10.0000 mg | INTRAMUSCULAR | Status: DC | PRN
  Administered 2024-02-06 – 2024-02-13 (×2): 10 mg via INTRAVENOUS
  Filled 2024-02-05 (×2): qty 1

## 2024-02-05 MED ORDER — ORAL CARE MOUTH RINSE
15.0000 mL | OROMUCOSAL | Status: DC | PRN
  Administered 2024-02-07: 15 mL via OROMUCOSAL

## 2024-02-05 MED ORDER — DIATRIZOATE MEGLUMINE & SODIUM 66-10 % PO SOLN
90.0000 mL | Freq: Once | ORAL | Status: AC
  Administered 2024-02-05: 90 mL via NASOGASTRIC
  Filled 2024-02-05: qty 90

## 2024-02-05 MED ORDER — MENTHOL 3 MG MT LOZG: 1.0000 | LOZENGE | OROMUCOSAL | Status: DC | PRN

## 2024-02-05 MED ORDER — SIMETHICONE 40 MG/0.6ML PO SUSP
80.0000 mg | Freq: Four times a day (QID) | ORAL | Status: DC | PRN
  Filled 2024-02-05: qty 1.2

## 2024-02-05 MED ORDER — ALUM & MAG HYDROXIDE-SIMETH 200-200-20 MG/5ML PO SUSP
30.0000 mL | Freq: Four times a day (QID) | ORAL | Status: DC | PRN
  Administered 2024-02-06: 30 mL via ORAL
  Filled 2024-02-05: qty 30

## 2024-02-05 MED ORDER — PHENOL 1.4 % MT LIQD
2.0000 | OROMUCOSAL | Status: DC | PRN
Start: 2024-02-05 — End: 2024-02-05
  Filled 2024-02-05: qty 177

## 2024-02-05 MED ORDER — MAGIC MOUTHWASH
15.0000 mL | Freq: Four times a day (QID) | ORAL | Status: DC | PRN
  Filled 2024-02-05: qty 15

## 2024-02-05 NOTE — TOC Initial Note (Signed)
 Transition of Care [facility]) - Initial/Assessment Note    Patient Details  Name: Sherry Holloway MRN: [id number] Date of Birth: 06-11-1928  Transition of [name]) CM/SW Contact:    Adrian Prows, RN Phone Number: 02/05/2024, 12:25 PM  Clinical Narrative:                 Sherry Holloway w/ pt and dtr Alfonso Ramus [phone]); pt lives a home; she will d/c to her dtr's home; dtr will provide transportation; pt verified PCP/insurance; they deny pt experiencing SDOH risks; pt has walker, wheelchair, BSC, shower chair; she also has grab bars in shower; pt does not have HH services or home oxygen; TOC is following.  Expected Discharge Plan: Home/Self Care Barriers to Discharge: Continued Medical Work up   Patient Goals and CMS Choice Patient states their goals for this hospitalization and ongoing recovery are:: home CMS Medicare.gov Compare Post Acute Care list provided to:: Patient        Expected Discharge Plan and Services   Discharge Planning Services: CM Consult   Living arrangements for the past 2 months: Apartment                                      Prior Living Arrangements/Services Living arrangements for the past 2 months: Apartment Lives with:: Self Patient language and need for interpreter reviewed:: Yes Do you feel safe going back to the place where you live?: Yes      Need for Family Participation in Patient Care: Yes (Comment) Care giver support system in place?: Yes (comment) Current home services: DME (walker, wheelchair, BSC, shower chair) Criminal Activity/Legal Involvement Pertinent to Current Situation/Hospitalization: No - Comment as needed  Activities of Daily Living   ADL Screening (condition at time of admission) Independently performs ADLs?: No Does the patient have a NEW difficulty with bathing/dressing/toileting/self-feeding that is expected to last >3 days?: No Does the patient have a NEW difficulty with getting in/out of bed,  walking, or climbing stairs that is expected to last >3 days?: No Does the patient have a NEW difficulty with communication that is expected to last >3 days?: No Is the patient deaf or have difficulty hearing?: Yes Does the patient have difficulty seeing, even when wearing glasses/contacts?: Yes Does the patient have difficulty concentrating, remembering, or making decisions?: Yes  Permission Sought/Granted   Permission granted to share information with : Yes, Verbal Permission Granted  Share Information with NAME: Case Manager     Permission granted to share info w Relationship: Kem Boroughs (dtr) [phone]     Emotional Assessment Appearance:: Appears stated age Attitude/Demeanor/Rapport: Gracious Affect (typically observed): Accepting Orientation: : Oriented to Self, Oriented to Place, Oriented to  Time, Oriented to Situation Alcohol / Substance Use: Not Applicable Psych Involvement: No (comment)  Admission diagnosis:  SBO (small bowel obstruction) (HCC) [K56.609] Intractable nausea and vomiting [R11.2] Patient Active Problem List   Diagnosis Date Noted   Intractable nausea and vomiting 02/04/2024   SBO (small bowel obstruction) (HCC) 02/04/2024   Multiple gastric ulcers 12/24/2023   History of biliary stent insertion 12/24/2023   Chronic anticoagulation 12/24/2023   Biliary obstruction 10/30/2023   LFT elevation 10/29/2023   IPMN (intraductal papillary mucinous neoplasm) 10/29/2023   Abdominal pain, chronic, right upper quadrant 10/29/2023   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) 10/29/2023   Abdominal pain 10/27/2023   History of pulmonary embolism 10/27/2023  Elevated liver enzymes 10/27/2023   Symptomatic sinus bradycardia 07/27/2023   Bradycardia 05/02/2023   Dizziness 05/02/2023   At high risk for injury related to fall 02/06/2023   Balance disorder 01/30/2023   Vulvar rash 04/02/2022   Hyperlipidemia associated with type 2 diabetes mellitus  (HCC) 04/02/2022   Rash and nonspecific skin eruption 02/19/2022   Chronic pain disorder 11/26/2021   Choledocholithiasis 11/25/2021   Personal history of esophageal cancer 08/26/2021   Weight loss, unintentional 08/26/2021   Type 2 diabetes mellitus with diabetic polyneuropathy, without long-term current use of insulin (HCC) 06/12/2021   Snoring 04/30/2021   At risk for central sleep apnea 04/30/2021   Acute pulmonary embolism (HCC) 03/29/2021   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) 03/19/2021   Other pulmonary embolism with acute cor pulmonale, unspecified chronicity (HCC)    Subclavian arterial stenosis (HCC) 02/13/2021   Acquired trigger finger of left middle finger 01/09/2021   Acquired trigger finger of left ring finger 01/09/2021   Trigger finger of left hand 12/24/2020   Plantar wart 12/24/2020   Pancreatic mass 10/19/2020   Gastroesophageal reflux disease with esophagitis without hemorrhage 10/19/2020   Hypertension 10/19/2020   Uncontrolled type 2 diabetes mellitus with hyperglycemia, without long-term current use of insulin (HCC) 10/19/2020   Vaginal odor 10/19/2020   Hyperlipidemia 10/19/2020   OSA (obstructive sleep apnea) 10/19/2020   Chronic bilateral back pain 10/19/2020   PCP:  Donato Schultz, DO Pharmacy:   [id number] [place], [place] - [address] AT [facility] OF [address] [place] [address] Phone: [phone] Fax: [phone]  [facility] - [place], [place] - [address] [place] [address] Phone: [phone] Fax: [phone]     Social Drivers of Health (SDOH) Social History: SDOH Screenings   Food Insecurity: No Food Insecurity (02/05/2024)  Housing: Low Risk  (02/05/2024)  Transportation Needs: No Transportation Needs (02/05/2024)  Utilities: Not At Risk (02/05/2024)  Alcohol Screen: Low Risk  (04/02/2022)  Depression (PHQ2-9): Low Risk   (01/30/2023)  Financial Resource Strain: Low Risk  (04/02/2022)  Physical Activity: Sufficiently Active (04/02/2022)  Social Connections: Moderately Isolated (02/04/2024)  Stress: No Stress Concern Present (04/02/2022)  Tobacco Use: Medium Risk (02/04/2024)   SDOH Interventions: Food Insecurity Interventions: Intervention Not Indicated, Inpatient TOC Housing Interventions: Intervention Not Indicated, Inpatient TOC Transportation Interventions: Intervention Not Indicated, Inpatient TOC Utilities Interventions: Intervention Not Indicated   Readmission Risk Interventions    02/05/2024   12:19 PM 10/31/2023    2:26 PM  Readmission Risk Prevention Plan  Transportation Screening Complete Complete  PCP or Specialist Appt within 3-5 Days Complete Complete  HRI or Home Care Consult Complete Complete  Social Work Consult for Recovery Care Planning/Counseling Complete Complete  Palliative Care Screening Not Applicable Not Applicable  Medication Review Oceanographer) Complete Complete

## 2024-02-05 NOTE — Progress Notes (Signed)
 PROGRESS NOTE  Sherry Holloway  NGE:[id number] DOB: [birth year] DOA: 02/04/2024 PCP: Donato Schultz, DO  Consultants  Brief Narrative: 89 y.o. female with medical history significant of essential hypertension, hyperlipidemia, GERD, esophageal cancer, diabetes, obstructive sleep apnea, biliary stones status post biliary stent placement back in November last year that presented to the ER with some abdominal pain.  Abdominal pain has been on and off since mid January.  Patient was recently seen in the January where workup was done and discharged home because findings with stable.  Pain has continued to persist.  Patient had about 15 pounds unintentional weight loss severe abdominal pain and right upper quadrant pain.  CT in ER suggested SBO.    Assessment & Plan: #1 possible small bowel obstruction:  - still with pain this AM.  Seen by surgery, appreciate input. Not surgical issue currently.  - NGT placed.  Small bowel series started - pain under fair control   - currently NPO.  On D5 fluids    #2 chronic calcific pancreatitis:  -  Pain control and supportive care no issues currently.    #3 chronic biliary obstruction.:   - defer outpt GI.     #4 GERD:  -Resume PPIs when able   #5 hyperlipidemia:  -Resume home regimen when taking NPO.  .   #6 history of esophageal cancer:  -In remission.  Continue outpatient oncology follow-up   #7 type 2 diabetes:  -Initiate sliding scale insulin while in the hospital.   #8 chronic pain syndrome:  - Continue chronic pain management.  #9 PE: History of the same in 2022.  Reason she is on eliquis, deemed high risk due to hx/o esophageal CA - hold while in house.         Pressure Injury 10/29/23 Heel Anterior;Right Deep Tissue Pressure Injury - Purple or maroon localized area of discolored intact skin or blood-filled blister due to damage of underlying soft tissue from pressure and/or shear. Heel ulcer (Active)  10/29/23 0947  Location:  Heel  Location Orientation: Anterior;Right  Staging: Deep Tissue Pressure Injury - Purple or maroon localized area of discolored intact skin or blood-filled blister due to damage of underlying soft tissue from pressure and/or shear.  Wound Description (Comments): Heel ulcer  Present on Admission:    DVT prophylaxis:  Place and maintain sequential compression device Start: 02/05/24 1041  Code Status:   Code Status: Limited: Do not attempt resuscitation (DNR) -DNR-LIMITED -Do Not Intubate/DNI  Family Communication: Daughter is nurse, at bedside, all questions answered.  Level of care: Med-Surg Status is: Inpatient Remains inpatient appropriate because: SBO   Consults called: surgery    Subjective: Seen this AM.  Pt awake and alert.  Still with some abdominal pain.  NGT in place.  No nausea currently.  No BM  Objective: Vitals:   02/04/24 2059 02/05/24 0126 02/05/24 0518 02/05/24 1416  BP: (!) 172/55 (!) 181/54 (!) 180/55 (!) 183/44  Pulse: 65 66 60 66  Resp: 20 16 18 18   Temp: 98.3 F (36.8 C) 98.7 F (37.1 C) 98.4 F (36.9 C) 98.1 F (36.7 C)  TempSrc: Oral Oral Oral Oral  SpO2: 97% 95% 95% 94%  Weight:      Height:        Intake/Output Summary (Last 24 hours) at 02/05/2024 1416 Last data filed at 02/04/2024 1757 Gross per 24 hour  Intake 999 ml  Output --  Net 999 ml   Filed Weights   02/04/24 1503  Weight: 59 kg   Body mass index is 22.31 kg/m.  Gen: 89 y.o. female in no apparent distress.  Nontoxic Pulm: Non-labored breathing.  Clear to auscultation bilaterally.  CV: Regular rate and rhythm. No murmur, rub, or gallop. No JVD GI: Abdomen soft, non-tender, non-distended, with normoactive bowel sounds. No organomegaly or masses felt. Ext: Warm, no deformities, no pedal edema Skin: No rashes, lesions noulcers Neuro: Alert and oriented. No focal neurological deficits. Psych: Calm  Judgement and insight appear normal. Mood & affect appropriate.     I have  personally reviewed the following labs and images: CBC: Recent Labs  Lab 02/04/24 1506 02/05/24 0439  WBC 17.0* 11.8*  HGB 12.0 10.8*  HCT 37.2 34.3*  MCV 84.2 87.7  PLT 264 210   BMP &GFR Recent Labs  Lab 02/04/24 1506 02/05/24 0439  NA 130* 133*  K 3.5 3.3*  CL 98 101  CO2 21* 21*  GLUCOSE 177* 131*  BUN 20 15  CREATININE 0.61 0.55  CALCIUM 9.2 8.6*   Estimated Creatinine Clearance: 37.1 mL/min (by C-G formula based on SCr of 0.55 mg/dL). Liver & Pancreas: Recent Labs  Lab 02/04/24 1506 02/05/24 0439  AST 10* 9*  ALT 15 13  ALKPHOS 75 59  BILITOT 0.5 0.5  PROT 6.1* 5.0*  ALBUMIN 2.8* 2.3*   Recent Labs  Lab 02/04/24 1506  LIPASE 19   No results for input(s): "AMMONIA" in the last 168 hours. Diabetic: No results for input(s): "HGBA1C" in the last 72 hours. No results for input(s): "GLUCAP" in the last 168 hours. Cardiac Enzymes: No results for input(s): "CKTOTAL", "CKMB", "CKMBINDEX", "TROPONINI" in the last 168 hours. Recent Labs    08/04/23 1422  PROBNP 251.0*   Coagulation Profile: No results for input(s): "INR", "PROTIME" in the last 168 hours. Thyroid Function Tests: No results for input(s): "TSH", "T4TOTAL", "FREET4", "T3FREE", "THYROIDAB" in the last 72 hours. Lipid Profile: No results for input(s): "CHOL", "HDL", "LDLCALC", "TRIG", "CHOLHDL", "LDLDIRECT" in the last 72 hours. Anemia Panel: No results for input(s): "VITAMINB12", "FOLATE", "FERRITIN", "TIBC", "IRON", "RETICCTPCT" in the last 72 hours. Urine analysis:    Component Value Date/Time   COLORURINE YELLOW 02/04/2024 1506   APPEARANCEUR CLEAR 02/04/2024 1506   LABSPEC 1.015 02/04/2024 1506   PHURINE 5.5 02/04/2024 1506   GLUCOSEU >=500 (A) 02/04/2024 1506   HGBUR NEGATIVE 02/04/2024 1506   BILIRUBINUR NEGATIVE 02/04/2024 1506   BILIRUBINUR negative 08/04/2023 1456   KETONESUR 40 (A) 02/04/2024 1506   PROTEINUR 100 (A) 02/04/2024 1506   UROBILINOGEN 0.2 08/04/2023 1456    NITRITE NEGATIVE 02/04/2024 1506   LEUKOCYTESUR NEGATIVE 02/04/2024 1506   Sepsis Labs: Invalid input(s): "PROCALCITONIN", "LACTICIDVEN"  Microbiology: No results found for this or any previous visit (from the past 240 hours).  Radiology Studies: DG Abd 1 View Result Date: 02/05/2024 CLINICAL DATA:  NG placement. EXAM: ABDOMEN - 1 VIEW COMPARISON:  CT abdomen pelvis dated 02/04/2024. FINDINGS: Enteric tube with side-port just distal to the GE junction and tip in the proximal stomach. Recommend further advancing by additional 5 cm for optimal positioning. IMPRESSION: Enteric tube with tip in the proximal stomach. Recommend further advancing by additional 5 cm. Electronically Signed   By: Elgie Collard M.D.   On: 02/05/2024 13:13   CT ABDOMEN PELVIS W CONTRAST Result Date: 02/04/2024 CLINICAL DATA:  Right upper quadrant pain. EXAM: CT ABDOMEN AND PELVIS WITH CONTRAST TECHNIQUE: Multidetector CT imaging of the abdomen and pelvis was performed using the standard protocol following  bolus administration of intravenous contrast. RADIATION DOSE REDUCTION: This exam was performed according to the departmental dose-optimization program which includes automated exposure control, adjustment of the mA and/or kV according to patient size and/or use of iterative reconstruction technique. CONTRAST:  OMNIPAQUE IOHEXOL 300 MG/ML  SOLN COMPARISON:  01/13/2024. FINDINGS: Lower chest: Scarring in the lung bases. Heart is at the upper limits of normal in size. No pericardial or pleural effusion. Atherosclerotic calcification of the aorta. Distal esophagus is grossly unremarkable. Hepatobiliary: Extensive pneumobilia with biliary stents in place, unchanged from 01/13/2024. Liver is slightly decreased in attenuation diffusely with probable scattered hyperdense perfusion anomalies. Cholecystectomy. Pancreas: Marked pancreatic ductal dilatation, 10 mm, stable. Pancreatic atrophy and calcifications Spleen: Negative.  Adrenals/Urinary Tract: Adrenal glands and kidneys are unremarkable. Ureters are decompressed. Bladder is grossly unremarkable. Stomach/Bowel: Dilated stomach, duodenum and jejunum with an apparent transition point in the right anatomic pelvis (301/49). Remainder of the small bowel is decompressed. Colon is grossly unremarkable. Vascular/Lymphatic: Atherosclerotic calcification of the aorta. No pathologically enlarged lymph nodes. Reproductive: Hysterectomy.  No adnexal mass. Other: No free fluid.  Mesenteries and peritoneum are unremarkable. Musculoskeletal: Left hip arthroplasty. Osteopenia. Degenerative changes in the spine. Spinal stimulator wires. Dextroconvex scoliosis. IMPRESSION: 1. Small-bowel obstruction with transition point in the right anatomic pelvis. 2. Liver appears mildly steatotic. 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 3. Chronic pancreatic ductal dilatation. Chronic calcific pancreatitis. 4.  Aortic atherosclerosis (ICD10-I70.0). Electronically Signed   By: Leanna Battles M.D.   On: 02/04/2024 17:07    Scheduled Meds:  diatrizoate meglumine-sodium  90 mL Per NG tube Once   docusate sodium  100 mg Oral QHS   docusate sodium  200 mg Oral Daily   empagliflozin  25 mg Oral QAC breakfast   irbesartan  75 mg Oral Daily   latanoprost  1 drop Both Eyes QHS   linaclotide  145 mcg Oral QAC breakfast   lipase/protease/amylase  36,000 Units Oral BID AC   mirtazapine  15 mg Oral QHS   morphine  30 mg Oral Q12H   pantoprazole  40 mg Oral Daily   sucralfate  1 g Oral BID   Continuous Infusions:  dextrose 5% lactated ringers 100 mL/hr at 02/05/24 0857     LOS: 1 day   35 minutes with more than 50% spent in reviewing records, counseling patient/family and coordinating care.  Tobey Grim, MD [facility] [email or web address] 02/05/2024, 2:16 PM

## 2024-02-05 NOTE — Plan of Care (Signed)

## 2024-02-05 NOTE — Consult Note (Addendum)
 Azia Toutant Pointer 1928-04-09  [id number].    Requesting MD: Gwendolyn Grant, MD Chief Complaint/Reason for Consult: SBO  HPI:  Sherry Holloway is a 89 y/o F with MMP including, but not limited to, HTN, diabetes, esophageal CA, chronic back pain after spinal surgery, and chronic constipation who presents with worsening abdominal pain in the right side of her abdomen. Pain as been present for 2 weeks and gradually worsened. She reports associated decrease in PO intake and abdominal distention that is worse at night. Her last BM was 3-4 days ago. She thinks she is still having flatus. At baseline she tells me that she takes a laxative every other day in order to have a BM. She is on linzess.   Abdominal surgical history: cholecystectomy, hysterectomy  Blood thinners: xarelto; originally prescribed after PE in 2022  Substance: former smoker, denies other etoh or drug use At baseline patient lives alone and is independent of ADLs. In a few days she is moving in with her daughter.   ROS: Review of Systems  All other systems reviewed and are negative.   Family History  Problem Relation Age of Onset   Diabetes Mother    Heart disease Father    Lung cancer Sister    Hypertension Daughter    Diabetes Daughter    Hypertension Daughter    Lung cancer Son    Hypertension Son    Diabetes Son    Hypertension Son    Colon cancer Neg Hx    Esophageal cancer Neg Hx    Inflammatory bowel disease Neg Hx    Liver disease Neg Hx    Pancreatic cancer Neg Hx    Rectal cancer Neg Hx    Stomach cancer Neg Hx     Past Medical History:  Diagnosis Date   Arthritis    Choledocholithiasis    Diabetes mellitus without complication (HCC)    Diverticulitis    Esophageal cancer (HCC) dx'd 2020   GERD (gastroesophageal reflux disease)    Hyperlipidemia    Hypertension    OSA (obstructive sleep apnea)    Urine incontinence    Uterine cancer (HCC)     Past Surgical History:  Procedure Laterality Date    ABDOMINAL HYSTERECTOMY  1972   APPENDECTOMY     BALLOON DILATION N/A 10/29/2023   Procedure: Billiary BALLOON DILATION;  Surgeon: Lemar Lofty., MD;  Location: [facility];  Service: Gastroenterology;  Laterality: N/A;   BILIARY DILATION  03/05/2021   Procedure: BILIARY DILATION;  Surgeon: Meridee Score Netty Starring., MD;  Location: [facility];  Service: Gastroenterology;;   BILIARY DILATION  11/27/2021   Procedure: BILIARY DILATION;  Surgeon: Rachael Fee, MD;  Location: [facility] ENDOSCOPY;  Service: Endoscopy;;   BILIARY DILATION  02/11/2022   Procedure: BILIARY DILATION;  Surgeon: Lemar Lofty., MD;  Location: [facility];  Service: Gastroenterology;;   BILIARY STENT PLACEMENT  11/27/2021   Procedure: BILIARY STENT PLACEMENT;  Surgeon: Rachael Fee, MD;  Location: [facility] ENDOSCOPY;  Service: Endoscopy;;   BILIARY STENT PLACEMENT N/A 10/29/2023   Procedure: BILIARY STENT PLACEMENT;  Surgeon: Lemar Lofty., MD;  Location: [facility];  Service: Gastroenterology;  Laterality: N/A;   BIOPSY  03/05/2021   Procedure: BIOPSY;  Surgeon: Lemar Lofty., MD;  Location: [facility];  Service: Gastroenterology;;   BIOPSY  11/27/2021   Procedure: BIOPSY;  Surgeon: Rachael Fee, MD;  Location: [facility] ENDOSCOPY;  Service: Endoscopy;;   BIOPSY  10/29/2023   Procedure: BIOPSY;  Surgeon: Lemar Lofty., MD;  Location: [facility];  Service: Gastroenterology;;   CHOLECYSTECTOMY     ENDOSCOPIC RETROGRADE CHOLANGIOPANCREATOGRAPHY (ERCP) WITH PROPOFOL N/A 03/05/2021   Procedure: ENDOSCOPIC RETROGRADE CHOLANGIOPANCREATOGRAPHY (ERCP) WITH PROPOFOL;  Surgeon: Lemar Lofty., MD;  Location: [facility];  Service: Gastroenterology;  Laterality: N/A;   ENDOSCOPIC RETROGRADE CHOLANGIOPANCREATOGRAPHY (ERCP) WITH PROPOFOL N/A 11/27/2021   Procedure: ENDOSCOPIC RETROGRADE CHOLANGIOPANCREATOGRAPHY (ERCP) WITH PROPOFOL;  Surgeon: Rachael Fee, MD;  Location:  [facility] ENDOSCOPY;  Service: Endoscopy;  Laterality: N/A;  possible EGD prior   ENDOSCOPIC RETROGRADE CHOLANGIOPANCREATOGRAPHY (ERCP) WITH PROPOFOL N/A 02/11/2022   Procedure: ENDOSCOPIC RETROGRADE CHOLANGIOPANCREATOGRAPHY (ERCP) WITH PROPOFOL;  Surgeon: Meridee Score Netty Starring., MD;  Location: [facility];  Service: Gastroenterology;  Laterality: N/A;   ERCP N/A 10/29/2023   Procedure: ENDOSCOPIC RETROGRADE CHOLANGIOPANCREATOGRAPHY (ERCP);  Surgeon: Lemar Lofty., MD;  Location: [facility];  Service: Gastroenterology;  Laterality: N/A;   ESOPHAGOGASTRODUODENOSCOPY N/A 11/27/2021   Procedure: ESOPHAGOGASTRODUODENOSCOPY (EGD);  Surgeon: Rachael Fee, MD;  Location: [facility] ENDOSCOPY;  Service: Endoscopy;  Laterality: N/A;   ESOPHAGOGASTRODUODENOSCOPY N/A 10/29/2023   Procedure: ESOPHAGOGASTRODUODENOSCOPY (EGD);  Surgeon: Lemar Lofty., MD;  Location: [facility] ENDOSCOPY;  Service: Gastroenterology;  Laterality: N/A;   ESOPHAGOGASTRODUODENOSCOPY (EGD) WITH PROPOFOL N/A 03/05/2021   Procedure: ESOPHAGOGASTRODUODENOSCOPY (EGD) WITH PROPOFOL;  Surgeon: Meridee Score Netty Starring., MD;  Location: [facility];  Service: Gastroenterology;  Laterality: N/A;   EUS N/A 03/05/2021   Procedure: UPPER ENDOSCOPIC ULTRASOUND (EUS) RADIAL;  Surgeon: Meridee Score Netty Starring., MD;  Location: [facility];  Service: Gastroenterology;  Laterality: N/A;   LITHOTRIPSY  02/11/2022   Procedure: LITHOTRIPSY;  Surgeon: Meridee Score Netty Starring., MD;  Location: [facility]OSCOPY;  Service: Gastroenterology;;   LITHOTRIPSY  10/29/2023   Procedure: LITHOTRIPSY;  Surgeon: Meridee Score Netty Starring., MD;  Location: [facility]OSCOPY;  Service: Gastroenterology;;   LUMBAR LAMINECTOMY     REMOVAL OF STONES  03/05/2021   Procedure: REMOVAL OF STONES;  Surgeon: Lemar Lofty., MD;  Location: [facility];  Service: Gastroenterology;;   REMOVAL OF STONES  11/27/2021   Procedure: REMOVAL OF STONES;  Surgeon: Rachael Fee, MD;   Location: [facility] ENDOSCOPY;  Service: Endoscopy;;   REMOVAL OF STONES  02/11/2022   Procedure: REMOVAL OF STONES;  Surgeon: Lemar Lofty., MD;  Location: [facility]OSCOPY;  Service: Gastroenterology;;   REMOVAL OF STONES  10/29/2023   Procedure: REMOVAL OF STONES;  Surgeon: Lemar Lofty., MD;  Location: [facility] ENDOSCOPY;  Service: Gastroenterology;;   SPINAL CORD STIMULATOR IMPLANT     Not active any more.   SPYGLASS CHOLANGIOSCOPY N/A 03/05/2021   Procedure: SPYGLASS CHOLANGIOSCOPY;  Surgeon: Lemar Lofty., MD;  Location: [facility];  Service: Gastroenterology;  Laterality: N/A;   SPYGLASS CHOLANGIOSCOPY N/A 02/11/2022   Procedure: SPYGLASS CHOLANGIOSCOPY;  Surgeon: Lemar Lofty., MD;  Location: [facility];  Service: Gastroenterology;  Laterality: N/A;   SPYGLASS LITHOTRIPSY N/A 03/05/2021   Procedure: [id number] LITHOTRIPSY;  Surgeon: Lemar Lofty., MD;  Location: [facility];  Service: Gastroenterology;  Laterality: N/A;   SPYGLASS LITHOTRIPSY N/A 02/11/2022   Procedure: [id number] LITHOTRIPSY;  Surgeon: Lemar Lofty., MD;  Location: [facility];  Service: Gastroenterology;  Laterality: N/A;   STENT REMOVAL  02/11/2022   Procedure: STENT REMOVAL;  Surgeon: Meridee Score Netty Starring., MD;  Location: [facility] ENDOSCOPY;  Service: Gastroenterology;;   TONSILLECTOMY     TOTAL KNEE ARTHROPLASTY Right     Social History:  reports that she quit smoking about 35 years ago. Her smoking use included cigarettes.  She started smoking about 70 years ago. She has a 35 pack-year smoking history. She has never used smokeless tobacco. She reports that she does not currently use alcohol. She reports that she does not use drugs.  Allergies:  Allergies  Allergen Reactions   Gramicidin Other (See Comments)    Gramicidin is an antibiotic peptide that is used to treat local infections caused by gram-positive bacteria. Unknown reaction   Sulfa Antibiotics Rash   Wound  Dressing Adhesive Rash and Other (See Comments)    Some dressings break out the skin    Medications Prior to Admission  Medication Sig Dispense Refill   XARELTO 20 MG TABS tablet TAKE 1 TABLET DAILY WITH SUPPER, START ONLY AFTER THE STARTER PACK HAS BEEN COMPLETED (Patient taking differently: Take 20 mg by mouth daily with supper.) 90 tablet 3   bumetanide (BUMEX) 1 MG tablet Take 0.5-1 tablets (0.5-1 mg total) by mouth daily as needed (swelling). (Patient not taking: Reported on 01/15/2024)     CREON 36000-114000 units CPEP capsule TAKE 1 CAPSULE THREE TIMES A DAY BEFORE MEALS (Patient taking differently: Take 36,000 Units by mouth See admin instructions. Take 36,000 units by mouth before breakfast and supper/evening meal) 2 capsule 4   docusate sodium (COLACE) 100 MG capsule Take 1 capsule (100 mg total) by mouth 2 (two) times daily. (Patient taking differently: Take 100-200 mg by mouth See admin instructions. Take 200 mg by mouth in the morning and 100 mg in the evening) 10 capsule 0   famciclovir (FAMVIR) 125 MG tablet TAKE 1 TABLET DAILY 90 tablet 3   glucose blood (FREESTYLE LITE) test strip CHECK BLOOD SUGAR TWICE A DAY 200 strip 12   JARDIANCE 25 MG TABS tablet TAKE 1 TABLET DAILY BEFORE BREAKFAST 90 tablet 3   latanoprost (XALATAN) 0.005 % ophthalmic solution Place 1 drop into both eyes at bedtime. 2.5 mL 2   LINZESS 145 MCG CAPS capsule TAKE 1 CAPSULE DAILY BEFORE BREAKFAST 90 capsule 3   mirtazapine (REMERON) 15 MG tablet TAKE 1 TABLET AT BEDTIME 90 tablet 3   morphine (MS CONTIN) 30 MG 12 hr tablet Take 30 mg by mouth every 12 (twelve) hours.     Multiple Vitamin (MULTIVITAMIN WITH MINERALS) TABS tablet Take 1 tablet by mouth daily with supper.     omeprazole (PRILOSEC) 40 MG capsule TAKE 1 CAPSULE IN THE MORNING AND AT BEDTIME 180 capsule 3   ondansetron (ZOFRAN-ODT) 4 MG disintegrating tablet Take 1 tablet (4 mg total) by mouth every 8 (eight) hours as needed for nausea or vomiting.  (Patient not taking: Reported on 01/15/2024) 20 tablet 0   senna-docusate (SENNA S) 8.6-50 MG tablet Take 1 tablet by mouth See admin instructions. Take 1 tablet by mouth in the morning and evening     sucralfate (CARAFATE) 1 g tablet Take 1 tablet (1 g total) by mouth 2 (two) times daily. 180 tablet 2   TYLENOL 500 MG tablet Take 500-1,000 mg by mouth every 6 (six) hours as needed for mild pain (pain score 1-3) or headache.     valsartan (DIOVAN) 40 MG tablet Take 1 tablet (40 mg total) by mouth daily. 90 tablet 3     Physical Exam: Blood pressure (!) 180/55, pulse 60, temperature 98.4 F (36.9 C), temperature source Oral, resp. rate 18, height 5\' 4"  (1.626 m), weight 59 kg, SpO2 95%. General: Pleasant female laying on hospital bed, appears stated age, NAD. HEENT: head -normocephalic, atraumatic; Eyes: PERRLA, no conjunctival injection  Neck- Trachea is midline, no thyromegaly or JVD appreciated.  CV- RRR, normal S1/S2, no M/R/G, pedal pulses palpable  Pulm- breathing is non-labored ORA Abd- soft, overall non-distended, TTP RLQ without guarding, scar from lower midline incision noted  GU- deferred  MSK- UE/LE symmetrical, no cyanosis, clubbing, or edema. Neuro- CN II-XII grossly in tact, no paresthesias. Psych- Alert and Oriented x3 with appropriate affect Skin: warm and dry, no rashes or lesions   Results for orders placed or performed during the hospital encounter of 02/04/24 (from the past 48 hours)  Lipase, blood     Status: None   Collection Time: 02/04/24  3:06 PM  Result Value Ref Range   Lipase 19 11 - 51 U/L    Comment: Performed at [facility], [address] [place], [place] [address]  Comprehensive metabolic panel     Status: Abnormal   Collection Time: 02/04/24  3:06 PM  Result Value Ref Range   Sodium 130 (L) 135 - 145 mmol/L   Potassium 3.5 3.5 - 5.1 mmol/L   Chloride 98 98 - 111 mmol/L   CO2 21 (L) 22 - 32 mmol/L   Glucose, Bld 177 (H) 70 - 99 mg/dL     Comment: Glucose reference range applies only to samples taken after fasting for at least 8 hours.   BUN 20 8 - 23 mg/dL   Creatinine, Ser 6.04 0.44 - 1.00 mg/dL   Calcium 9.2 8.9 - 54.0 mg/dL   Total Protein 6.1 (L) 6.5 - 8.1 g/dL   Albumin 2.8 (L) 3.5 - 5.0 g/dL   AST 10 (L) 15 - 41 U/L   ALT 15 0 - 44 U/L   Alkaline Phosphatase 75 38 - 126 U/L   Total Bilirubin 0.5 0.0 - 1.2 mg/dL   GFR, Estimated >98 >11 mL/min    Comment: (NOTE) Calculated using the CKD-EPI Creatinine Equation (2021)    Anion gap 11 5 - 15    Comment: Performed at [facility], [address] [place], [place] [address]  CBC     Status: Abnormal   Collection Time: 02/04/24  3:06 PM  Result Value Ref Range   WBC 17.0 (H) 4.0 - 10.5 K/uL   RBC 4.42 3.87 - 5.11 MIL/uL   Hemoglobin 12.0 12.0 - 15.0 g/dL   HCT 29.5 62.1 - 30.8 %   MCV 84.2 80.0 - 100.0 fL   MCH 27.1 26.0 - 34.0 pg   MCHC 32.3 30.0 - 36.0 g/dL   RDW 65.7 84.6 - 96.2 %   Platelets 264 150 - 400 K/uL   nRBC 0.0 0.0 - 0.2 %    Comment: Performed at [facility], [address] [place], [place] [address]  Urinalysis, Routine w reflex microscopic -Urine, Clean Catch     Status: Abnormal   Collection Time: 02/04/24  3:06 PM  Result Value Ref Range   Color, Urine YELLOW YELLOW   APPearance CLEAR CLEAR   Specific Gravity, Urine 1.015 1.005 - 1.030   pH 5.5 5.0 - 8.0   Glucose, UA >=500 (A) NEGATIVE mg/dL   Hgb urine dipstick NEGATIVE NEGATIVE   Bilirubin Urine NEGATIVE NEGATIVE   Ketones, ur 40 (A) NEGATIVE mg/dL   Protein, ur 132 (A) NEGATIVE mg/dL   Nitrite NEGATIVE NEGATIVE   Leukocytes,Ua NEGATIVE NEGATIVE    Comment: Performed at [facility], [address] [place], [place] [address]  Urinalysis, Microscopic (reflex)  Status: Abnormal   Collection Time: 02/04/24  3:06 PM  Result Value Ref Range   RBC / HPF NONE SEEN 0 - 5 RBC/hpf   WBC, UA NONE SEEN 0 - 5 WBC/hpf   Bacteria, UA RARE (A) NONE SEEN    Squamous Epithelial / HPF 0-5 0 - 5 /HPF    Comment: Performed at [facility], [address] [place], [place] [address]  Comprehensive metabolic panel     Status: Abnormal   Collection Time: 02/05/24  4:39 AM  Result Value Ref Range   Sodium 133 (L) 135 - 145 mmol/L   Potassium 3.3 (L) 3.5 - 5.1 mmol/L   Chloride 101 98 - 111 mmol/L   CO2 21 (L) 22 - 32 mmol/L   Glucose, Bld 131 (H) 70 - 99 mg/dL    Comment: Glucose reference range applies only to samples taken after fasting for at least 8 hours.   BUN 15 8 - 23 mg/dL   Creatinine, Ser 6.04 0.44 - 1.00 mg/dL   Calcium 8.6 (L) 8.9 - 10.3 mg/dL   Total Protein 5.0 (L) 6.5 - 8.1 g/dL   Albumin 2.3 (L) 3.5 - 5.0 g/dL   AST 9 (L) 15 - 41 U/L   ALT 13 0 - 44 U/L   Alkaline Phosphatase 59 38 - 126 U/L   Total Bilirubin 0.5 0.0 - 1.2 mg/dL   GFR, Estimated >54 >09 mL/min    Comment: (NOTE) Calculated using the CKD-EPI Creatinine Equation (2021)    Anion gap 11 5 - 15    Comment: Performed at [facility], [address], [place], [place] [address]  CBC     Status: Abnormal   Collection Time: 02/05/24  4:39 AM  Result Value Ref Range   WBC 11.8 (H) 4.0 - 10.5 K/uL   RBC 3.91 3.87 - 5.11 MIL/uL   Hemoglobin 10.8 (L) 12.0 - 15.0 g/dL   HCT 47.8 (L) 29.5 - 62.1 %   MCV 87.7 80.0 - 100.0 fL   MCH 27.6 26.0 - 34.0 pg   MCHC 31.5 30.0 - 36.0 g/dL   RDW 30.8 65.7 - 84.6 %   Platelets 210 150 - 400 K/uL   nRBC 0.0 0.0 - 0.2 %    Comment: Performed at [facility], [address], [place], [place] [address]   CT ABDOMEN PELVIS W CONTRAST Result Date: 02/04/2024 CLINICAL DATA:  Right upper quadrant pain. EXAM: CT ABDOMEN AND PELVIS WITH CONTRAST TECHNIQUE: Multidetector CT imaging of the abdomen and pelvis was performed using the standard protocol following bolus administration of intravenous contrast. RADIATION DOSE REDUCTION: This exam was performed according to the departmental  dose-optimization program which includes automated exposure control, adjustment of the mA and/or kV according to patient size and/or use of iterative reconstruction technique. CONTRAST:  OMNIPAQUE IOHEXOL 300 MG/ML  SOLN COMPARISON:  01/13/2024. FINDINGS: Lower chest: Scarring in the lung bases. Heart is at the upper limits of normal in size. No pericardial or pleural effusion. Atherosclerotic calcification of the aorta. Distal esophagus is grossly unremarkable. Hepatobiliary: Extensive pneumobilia with biliary stents in place, unchanged from 01/13/2024. Liver is slightly decreased in attenuation diffusely with probable scattered hyperdense perfusion anomalies. Cholecystectomy. Pancreas: Marked pancreatic ductal dilatation, 10 mm, stable. Pancreatic atrophy and calcifications Spleen: Negative. Adrenals/Urinary Tract: Adrenal glands and kidneys are unremarkable. Ureters are decompressed. Bladder is grossly unremarkable. Stomach/Bowel: Dilated stomach, duodenum and jejunum with an apparent transition point in the right  anatomic pelvis (301/49). Remainder of the small bowel is decompressed. Colon is grossly unremarkable. Vascular/Lymphatic: Atherosclerotic calcification of the aorta. No pathologically enlarged lymph nodes. Reproductive: Hysterectomy.  No adnexal mass. Other: No free fluid.  Mesenteries and peritoneum are unremarkable. Musculoskeletal: Left hip arthroplasty. Osteopenia. Degenerative changes in the spine. Spinal stimulator wires. Dextroconvex scoliosis. IMPRESSION: 1. Small-bowel obstruction with transition point in the right anatomic pelvis. 2. Liver appears mildly steatotic. 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 3. Chronic pancreatic ductal dilatation. Chronic calcific pancreatitis. 4.  Aortic atherosclerosis (ICD10-I70.0). Electronically Signed   By: Leanna Battles M.D.   On: 02/04/2024 17:07   Assessment/Plan SBO  - afebrile, VSS,  WBC 11.8 from 17 yesterday - CT scan of the abdomen and pelvis shows SBO with transition point in RLQ. No pneumoperitoneum or signs of bowel ischemia on CT. Visible biliary stents noted in place from her history of choledocholithiasis 10/2023. Gallbladder surgically absent.  - no emergent role for surgery - recommend NG tube and SBO protocol   FEN - NPO, NG LIWS, ice chips and sips with necessary meds are ok, IVF per primary VTE - SCD's, please hold xarelto ID - none Admit - TR[facility] service   I reviewed nursing notes, ED provider notes, hospitalist notes, last 24 h vitals and pain scores, last 48 h intake and output, last 24 h labs and trends, and last 24 h imaging results.  Adam Phenix, [facility] Surgery 02/05/2024, 8:40 AM Please see Amion for pager number during day hours 7:00am-4:30pm or 7:00am -11:30am on weekends

## 2024-02-05 NOTE — Plan of Care (Signed)

## 2024-02-06 ENCOUNTER — Inpatient Hospital Stay (HOSPITAL_COMMUNITY): Payer: Medicare Other

## 2024-02-06 DIAGNOSIS — K56609 Unspecified intestinal obstruction, unspecified as to partial versus complete obstruction: Secondary | ICD-10-CM | POA: Diagnosis not present

## 2024-02-06 LAB — CBC
HCT: 35.7 % — ABNORMAL LOW (ref 36.0–46.0)
Hemoglobin: 11.3 g/dL — ABNORMAL LOW (ref 12.0–15.0)
MCH: 27.4 pg (ref 26.0–34.0)
MCHC: 31.7 g/dL (ref 30.0–36.0)
MCV: 86.7 fL (ref 80.0–100.0)
Platelets: 202 10*3/uL (ref 150–400)
RBC: 4.12 MIL/uL (ref 3.87–5.11)
RDW: 14.2 % (ref 11.5–15.5)
WBC: 11.5 10*3/uL — ABNORMAL HIGH (ref 4.0–10.5)
nRBC: 0 % (ref 0.0–0.2)

## 2024-02-06 LAB — BASIC METABOLIC PANEL
Anion gap: 11 (ref 5–15)
BUN: 12 mg/dL (ref 8–23)
CO2: 22 mmol/L (ref 22–32)
Calcium: 8.7 mg/dL — ABNORMAL LOW (ref 8.9–10.3)
Chloride: 103 mmol/L (ref 98–111)
Creatinine, Ser: 0.47 mg/dL (ref 0.44–1.00)
GFR, Estimated: >60 mL/min (ref >60)
Glucose, Bld: 92 mg/dL (ref 70–99)
Potassium: 2.9 mmol/L — ABNORMAL LOW (ref 3.5–5.1)
Sodium: 136 mmol/L (ref 135–145)

## 2024-02-06 LAB — MAGNESIUM: Magnesium: 2 mg/dL (ref 1.7–2.4)

## 2024-02-06 MED ORDER — PROCHLORPERAZINE EDISYLATE 10 MG/2ML IJ SOLN
5.0000 mg | Freq: Four times a day (QID) | INTRAMUSCULAR | Status: AC | PRN
  Administered 2024-02-12: 5 mg via INTRAVENOUS
  Filled 2024-02-06: qty 2

## 2024-02-06 MED ORDER — PROCHLORPERAZINE EDISYLATE 10 MG/2ML IJ SOLN
INTRAMUSCULAR | Status: AC
  Administered 2024-02-06: 5 mg via INTRAVENOUS
  Filled 2024-02-06: qty 2

## 2024-02-06 MED ORDER — RIVAROXABAN 20 MG PO TABS
20.0000 mg | ORAL_TABLET | Freq: Every day | ORAL | Status: DC
  Administered 2024-02-06 – 2024-02-11 (×6): 20 mg via ORAL
  Filled 2024-02-06 (×7): qty 1

## 2024-02-06 MED ORDER — POTASSIUM CHLORIDE 20 MEQ PO PACK
60.0000 meq | PACK | Freq: Once | ORAL | Status: AC
  Administered 2024-02-06: 60 meq via ORAL
  Filled 2024-02-06: qty 3

## 2024-02-06 MED ORDER — POTASSIUM CHLORIDE IN NACL 40-0.9 MEQ/L-% IV SOLN
INTRAVENOUS | Status: DC
  Filled 2024-02-06 (×2): qty 1000

## 2024-02-06 NOTE — Plan of Care (Signed)

## 2024-02-06 NOTE — Progress Notes (Addendum)
 PROGRESS NOTE  Sherry Holloway  ZOX:[id number] DOB: December 28, 1927 DOA: 02/04/2024 PCP: Donato Schultz, DO  Consultants  Brief Narrative: 89 y.o. female with a medical history significant of essential hypertension, hyperlipidemia, GERD, esophageal cancer, diabetes, obstructive sleep apnea, biliary stones status post biliary stent placement back in November last year that presented to the ER with some abdominal pain.  Abdominal pain has been on and off since mid January.  Patient was recently seen in the January where workup was done and discharged home because findings with stable.  Pain has continued to persist.  Patient had about 15 pounds unintentional weight loss severe abdominal pain and right upper quadrant pain.  CT in ER suggested SBO.  Admitted to hospitalist service.  Surgery consulted.  Good bowel movements overnight and NG tube removed hospital day #2.     Assessment & Plan: #1  Small bowel obstruction: -Resolved via clinical examination, bowel movements, small bowel series on x-ray. -Surgery pulled NG tube earlier this morning. -Patient feeling very well throughout the morning. -However she began having worsening pain that started after liquid diet challenge at lunch. -Will continue to watch on liquids and low threshold to obtain imaging/restart NG tube if abdomen becomes distended or pain worsens. -Of note she has had chronic abdominal pain with pancreatitis and biliary obstruction as noted below.  She is on longstanding PPIs and we will restart these now that she is taking p.o. again.   #2 chronic calcific pancreatitis:  -  Pain control and supportive care no issues currently.    #3 chronic biliary obstruction.:   - defer outpt GI.     #4 GERD:  -Resume PPIs   #5 hyperlipidemia:  -Resume home regimen when taking NPO.  .   #6 history of esophageal cancer:  -In remission.  Continue outpatient oncology follow-up   #7 type 2 diabetes:  -Initiate sliding scale insulin  while in the hospital.   #8 chronic pain syndrome:  - Continue chronic pain management.   #9 PE: History of the same in 2022.  Reason she is on eliquis, deemed high risk due to hx/o esophageal CA -SBO resolved and Xarelto has been resumed.  10.  Right subclavian stenosis: - longstanding issue for patient per daughter - more tolerant of higher BPs b/c of this  11. Hypokalemia: - present on admission, replaced.  This AM at 2.9, replaced orally and in fluids - ongoing bowel movements and losses through prior NGT.   - serial K+  12.  HTN: - present outpt, elevated here as well - On Avapro.  As above, with reported subclavian stenosis, daughter hesitant to lower BP much b/c of this  - PRN hydralazine on board for systolics >180     Pressure Injury 10/29/23 Heel Anterior;Right Deep Tissue Pressure Injury - Purple or maroon localized area of discolored intact skin or blood-filled blister due to damage of underlying soft tissue from pressure and/or shear. Heel ulcer (Active)  10/29/23 0947  Location: Heel  Location Orientation: Anterior;Right  Staging: Deep Tissue Pressure Injury - Purple or maroon localized area of discolored intact skin or blood-filled blister due to damage of underlying soft tissue from pressure and/or shear.  Wound Description (Comments): Heel ulcer  Present on Admission:    DVT prophylaxis:  Place and maintain sequential compression device Start: 02/05/24 1041 rivaroxaban (XARELTO) tablet 20 mg  Code Status:   Code Status: Limited: Do not attempt resuscitation (DNR) -DNR-LIMITED -Do Not Intubate/DNI  Family Communication: Spoke at length  with daughter Michael Litter on the phone. Level of care: Med-Surg Status is: Inpatient Remains inpatient appropriate because: Small bowel obstruction   Consults called: Surgery  Subjective: This morning when I saw her she was awake and alert and in no pain.  Feeling very relieved that her NG tube and pulled.  She had just finished  a popsicle and felt good.  Later in the morning after liquid challenge at lunch however she began having worsening abdominal pain.  Denied this pain being as bad as when she came in but uncomfortable.  Still with liquid bowel movements.  Objective: Vitals:   02/05/24 2006 02/06/24 0526 02/06/24 1200 02/06/24 1401  BP: (!) 153/50 (!) 175/52 (!) 202/54 (!) 160/44  Pulse: 66 61 66 69  Resp: 18 20 18 18   Temp: 98 F (36.7 C) 97.9 F (36.6 C) 97.9 F (36.6 C) 98 F (36.7 C)  TempSrc: Oral Oral Oral Oral  SpO2: 92% 95% 97% 94%  Weight:      Height:        Intake/Output Summary (Last 24 hours) at 02/06/2024 1450 Last data filed at 02/06/2024 0730 Gross per 24 hour  Intake --  Output 50 ml  Net -50 ml   Filed Weights   02/04/24 1503  Weight: 59 kg   Body mass index is 22.31 kg/m.  Gen: 89 y.o. female in no apparent distress.  Nontoxic Pulm: Non-labored breathing.  Clear to auscultation bilaterally.  CV: Regular rate and rhythm. No murmur, rub, or gallop. No JVD GI: Abdomen soft, nondistended.  Minimal tenderness in epigastrium.  No guarding or rebound. Ext: Warm, no deformities, no pedal edema Skin: No rashes, lesions no ulcers Neuro: Alert and oriented. No focal neurological deficits. Psych: Calm  Judgement and insight appear normal. Mood & affect appropriate.     I have personally reviewed the following labs and images: CBC: Recent Labs  Lab 02/04/24 1506 02/05/24 0439 02/06/24 0416  WBC 17.0* 11.8* 11.5*  HGB 12.0 10.8* 11.3*  HCT 37.2 34.3* 35.7*  MCV 84.2 87.7 86.7  PLT 264 210 202   BMP &GFR Recent Labs  Lab 02/04/24 1506 02/05/24 0439 02/06/24 0416 02/06/24 1104  NA 130* 133* 136  --   K 3.5 3.3* 2.9*  --   CL 98 101 103  --   CO2 21* 21* 22  --   GLUCOSE 177* 131* 92  --   BUN 20 15 12   --   CREATININE 0.61 0.55 0.47  --   CALCIUM 9.2 8.6* 8.7*  --   MG  --   --   --  2.0   Estimated Creatinine Clearance: 37.1 mL/min (by C-G formula based on  SCr of 0.47 mg/dL). Liver & Pancreas: Recent Labs  Lab 02/04/24 1506 02/05/24 0439  AST 10* 9*  ALT 15 13  ALKPHOS 75 59  BILITOT 0.5 0.5  PROT 6.1* 5.0*  ALBUMIN 2.8* 2.3*   Recent Labs  Lab 02/04/24 1506  LIPASE 19   No results for input(s): "AMMONIA" in the last 168 hours. Diabetic: No results for input(s): "HGBA1C" in the last 72 hours. No results for input(s): "GLUCAP" in the last 168 hours. Cardiac Enzymes: No results for input(s): "CKTOTAL", "CKMB", "CKMBINDEX", "TROPONINI" in the last 168 hours. Recent Labs    08/04/23 1422  PROBNP 251.0*   Coagulation Profile: No results for input(s): "INR", "PROTIME" in the last 168 hours. Thyroid Function Tests: No results for input(s): "TSH", "T4TOTAL", "FREET4", "T3FREE", "THYROIDAB" in the last  72 hours. Lipid Profile: No results for input(s): "CHOL", "HDL", "LDLCALC", "TRIG", "CHOLHDL", "LDLDIRECT" in the last 72 hours. Anemia Panel: No results for input(s): "VITAMINB12", "FOLATE", "FERRITIN", "TIBC", "IRON", "RETICCTPCT" in the last 72 hours. Urine analysis:    Component Value Date/Time   COLORURINE YELLOW 02/04/2024 1506   APPEARANCEUR CLEAR 02/04/2024 1506   LABSPEC 1.015 02/04/2024 1506   PHURINE 5.5 02/04/2024 1506   GLUCOSEU >=500 (A) 02/04/2024 1506   HGBUR NEGATIVE 02/04/2024 1506   BILIRUBINUR NEGATIVE 02/04/2024 1506   BILIRUBINUR negative 08/04/2023 1456   KETONESUR 40 (A) 02/04/2024 1506   PROTEINUR 100 (A) 02/04/2024 1506   UROBILINOGEN 0.2 08/04/2023 1456   NITRITE NEGATIVE 02/04/2024 1506   LEUKOCYTESUR NEGATIVE 02/04/2024 1506   Sepsis Labs: Invalid input(s): "PROCALCITONIN", "LACTICIDVEN"  Microbiology: No results found for this or any previous visit (from the past 240 hours).  Radiology Studies: DG Abd Portable 1V Result Date: 02/06/2024 CLINICAL DATA:  [id number] SBO (small bowel obstruction) (HCC) [id number] EXAM: PORTABLE ABDOMEN - 1 VIEW COMPARISON:  Abdominal radiograph from earlier today  FINDINGS: Interval antegrade transit of oral contrast to the distal colon. Mildly dilated small bowel loops up to the 3 cm diameter, slightly improved. No evidence of pneumatosis or pneumoperitoneum. Right lower lateral abdominal wall spinal stimulator with leads coursing into the thoracic spinal canal. Enteric tube tip in the body of the stomach. Biliary stents noted in the right upper quadrant. Partially visualized left total hip arthroplasty. IMPRESSION: Findings compatible with resolving small bowel obstruction with oral contrast transits to the distal colon. Electronically Signed   By: Delbert Phenix M.D.   On: 02/06/2024 08:15   DG Abd Portable 1V-Small Bowel Obstruction Protocol-initial, 8 hr delay Result Date: 02/06/2024 CLINICAL DATA:  Small bowel protocol EXAM: PORTABLE ABDOMEN - 1 VIEW COMPARISON:  Plain films earlier today.  CT 02/04/2024 FINDINGS: NG tube is in the stomach. Contrast material noted within small bowel loops within the right abdomen. Several small bowel loops remain dilated. No definite opacification of the large bowel currently. IMPRESSION: Contrast material within right small bowel loops without definite passage into colon currently. Mildly prominent small bowel loops remain compatible with small bowel obstruction. Electronically Signed   By: Charlett Nose M.D.   On: 02/06/2024 00:21    Scheduled Meds:  docusate sodium  100 mg Oral QHS   docusate sodium  200 mg Oral Daily   empagliflozin  25 mg Oral QAC breakfast   irbesartan  75 mg Oral Daily   latanoprost  1 drop Both Eyes QHS   linaclotide  145 mcg Oral QAC breakfast   lipase/protease/amylase  36,000 Units Oral BID AC   mirtazapine  15 mg Oral QHS   morphine  30 mg Oral Q12H   pantoprazole  40 mg Oral Daily   rivaroxaban  20 mg Oral Q supper   sucralfate  1 g Oral BID   Continuous Infusions:  0.9 % NaCl with KCl 40 mEq / L 75 mL/hr at 02/06/24 1032     LOS: 2 days   35 minutes with more than 50% spent in  reviewing records, counseling patient/family and coordinating care.  Tobey Grim, MD [facility] [email or web address] 02/06/2024, 2:50 PM

## 2024-02-06 NOTE — Progress Notes (Signed)
 Mobility Specialist - Progress Note   02/06/24 1021  Mobility  Activity Ambulated with assistance in hallway  Level of Assistance Standby assist, set-up cues, supervision of patient - no hands on  Assistive Device Front wheel walker  Distance Ambulated (ft) 120 ft  Activity Response Tolerated well  Mobility Referral Yes  Mobility visit 1 Mobility  Mobility Specialist Start Time (ACUTE ONLY) 1004  Mobility Specialist Stop Time (ACUTE ONLY) 1019  Mobility Specialist Time Calculation (min) (ACUTE ONLY) 15 min   Pt received in recliner and agreeable to mobility. No complaints during session. Pt to recliner after session with all needs met.   [facility]

## 2024-02-06 NOTE — Progress Notes (Addendum)
 [facility] Progress Note     Subjective: CC:  Abdominal pain improved. Multiple loose BMs overnight. Denies nausea or vomiting.   Objective: Vital signs in last 24 hours: Temp:  [97.9 F (36.6 C)-98.1 F (36.7 C)] 97.9 F (36.6 C) (02/21 0526) Pulse Rate:  [61-66] 61 (02/21 0526) Resp:  [18-20] 20 (02/21 0526) BP: (153-183)/(44-52) 175/52 (02/21 0526) SpO2:  [92 %-95 %] 95 % (02/21 0526) Last BM Date : 02/01/24  Intake/Output from previous day: 02/20 0701 - 02/21 0700 In: -  Out: 10 [Emesis/NG output:10] Intake/Output this shift: No intake/output data recorded.  PE: Gen:  Alert, NAD, pleasant Pulm:  Normal effort ORA Abd: Soft, mild TTP R abdomen, less pain compared to yesterday, nondistended, no peritonitis   NG with small amt dark bilious drainage. NGT removed by me Skin: warm and dry, no rashes  Psych: A&Ox3   Lab Results:  Recent Labs    02/05/24 0439 02/06/24 0416  WBC 11.8* 11.5*  HGB 10.8* 11.3*  HCT 34.3* 35.7*  PLT 210 202   BMET Recent Labs    02/05/24 0439 02/06/24 0416  NA 133* 136  K 3.3* 2.9*  CL 101 103  CO2 21* 22  GLUCOSE 131* 92  BUN 15 12  CREATININE 0.55 0.47  CALCIUM 8.6* 8.7*   PT/INR No results for input(s): "LABPROT", "INR" in the last 72 hours. CMP     Component Value Date/Time   NA 136 02/06/2024 0416   K 2.9 (L) 02/06/2024 0416   CL 103 02/06/2024 0416   CO2 22 02/06/2024 0416   GLUCOSE 92 02/06/2024 0416   BUN 12 02/06/2024 0416   CREATININE 0.47 02/06/2024 0416   CREATININE 0.94 11/07/2023 1422   CALCIUM 8.7 (L) 02/06/2024 0416   PROT 5.0 (L) 02/05/2024 0439   ALBUMIN 2.3 (L) 02/05/2024 0439   AST 9 (L) 02/05/2024 0439   AST 12 (L) 04/02/2023 1359   ALT 13 02/05/2024 0439   ALT 20 04/02/2023 1359   ALKPHOS 59 02/05/2024 0439   BILITOT 0.5 02/05/2024 0439   BILITOT 0.3 04/02/2023 1359   GFRNONAA >60 02/06/2024 0416   GFRNONAA 53 (L) 04/02/2023 1359   Lipase     Component Value Date/Time    LIPASE 19 02/04/2024 1506       Studies/Results: DG Abd Portable 1V Result Date: 02/06/2024 CLINICAL DATA:  [id number] SBO (small bowel obstruction) (HCC) [id number] EXAM: PORTABLE ABDOMEN - 1 VIEW COMPARISON:  Abdominal radiograph from earlier today FINDINGS: Interval antegrade transit of oral contrast to the distal colon. Mildly dilated small bowel loops up to the 3 cm diameter, slightly improved. No evidence of pneumatosis or pneumoperitoneum. Right lower lateral abdominal wall spinal stimulator with leads coursing into the thoracic spinal canal. Enteric tube tip in the body of the stomach. Biliary stents noted in the right upper quadrant. Partially visualized left total hip arthroplasty. IMPRESSION: Findings compatible with resolving small bowel obstruction with oral contrast transits to the distal colon. Electronically Signed   By: Delbert Phenix M.D.   On: 02/06/2024 08:15   DG Abd Portable 1V-Small Bowel Obstruction Protocol-initial, 8 hr delay Result Date: 02/06/2024 CLINICAL DATA:  Small bowel protocol EXAM: PORTABLE ABDOMEN - 1 VIEW COMPARISON:  Plain films earlier today.  CT 02/04/2024 FINDINGS: NG tube is in the stomach. Contrast material noted within small bowel loops within the right abdomen. Several small bowel loops remain dilated. No definite opacification of the large bowel currently. IMPRESSION: Contrast material within right small  bowel loops without definite passage into colon currently. Mildly prominent small bowel loops remain compatible with small bowel obstruction. Electronically Signed   By: Charlett Nose M.D.   On: 02/06/2024 00:21   DG Abd Portable 1V Result Date: 02/05/2024 CLINICAL DATA:  NG placement. EXAM: PORTABLE ABDOMEN - 1 VIEW COMPARISON:  CT abdomen pelvis dated 02/04/2024. FINDINGS: Enteric tube with side port and tip in the proximal stomach. Recommend further advancing by additional 5 cm for optimal positioning. IMPRESSION: Enteric tube with tip in the proximal stomach.  Recommend further advancing by additional 5 cm for optimal positioning. Electronically Signed   By: Elgie Collard M.D.   On: 02/05/2024 14:54   DG Abd 1 View Result Date: 02/05/2024 CLINICAL DATA:  NG placement. EXAM: ABDOMEN - 1 VIEW COMPARISON:  CT abdomen pelvis dated 02/04/2024. FINDINGS: Enteric tube with side-port just distal to the GE junction and tip in the proximal stomach. Recommend further advancing by additional 5 cm for optimal positioning. IMPRESSION: Enteric tube with tip in the proximal stomach. Recommend further advancing by additional 5 cm. Electronically Signed   By: Elgie Collard M.D.   On: 02/05/2024 13:13   CT ABDOMEN PELVIS W CONTRAST Result Date: 02/04/2024 CLINICAL DATA:  Right upper quadrant pain. EXAM: CT ABDOMEN AND PELVIS WITH CONTRAST TECHNIQUE: Multidetector CT imaging of the abdomen and pelvis was performed using the standard protocol following bolus administration of intravenous contrast. RADIATION DOSE REDUCTION: This exam was performed according to the departmental dose-optimization program which includes automated exposure control, adjustment of the mA and/or kV according to patient size and/or use of iterative reconstruction technique. CONTRAST:  OMNIPAQUE IOHEXOL 300 MG/ML  SOLN COMPARISON:  01/13/2024. FINDINGS: Lower chest: Scarring in the lung bases. Heart is at the upper limits of normal in size. No pericardial or pleural effusion. Atherosclerotic calcification of the aorta. Distal esophagus is grossly unremarkable. Hepatobiliary: Extensive pneumobilia with biliary stents in place, unchanged from 01/13/2024. Liver is slightly decreased in attenuation diffusely with probable scattered hyperdense perfusion anomalies. Cholecystectomy. Pancreas: Marked pancreatic ductal dilatation, 10 mm, stable. Pancreatic atrophy and calcifications Spleen: Negative. Adrenals/Urinary Tract: Adrenal glands and kidneys are unremarkable. Ureters are decompressed. Bladder is  grossly unremarkable. Stomach/Bowel: Dilated stomach, duodenum and jejunum with an apparent transition point in the right anatomic pelvis (301/49). Remainder of the small bowel is decompressed. Colon is grossly unremarkable. Vascular/Lymphatic: Atherosclerotic calcification of the aorta. No pathologically enlarged lymph nodes. Reproductive: Hysterectomy.  No adnexal mass. Other: No free fluid.  Mesenteries and peritoneum are unremarkable. Musculoskeletal: Left hip arthroplasty. Osteopenia. Degenerative changes in the spine. Spinal stimulator wires. Dextroconvex scoliosis. IMPRESSION: 1. Small-bowel obstruction with transition point in the right anatomic pelvis. 2. Liver appears mildly steatotic. 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 3. Chronic pancreatic ductal dilatation. Chronic calcific pancreatitis. 4.  Aortic atherosclerosis (ICD10-I70.0). Electronically Signed   By: Leanna Battles M.D.   On: 02/04/2024 17:07    Anti-infectives: Anti-infectives (From admission, onward)    None        Assessment/Plan  SBO  - afebrile, VSS, WBC 11 - CT scan of the abdomen and pelvis shows SBO with transition point in RLQ. No pneumoperitoneum or signs of bowel ischemia on CT. Visible biliary stents noted in place from her history of choledocholithiasis 10/2023. Gallbladder surgically absent.  - SBO protocol >> Contrast in colon this morning - no emergent role for surgery. SBO resolving with non-operative measures. Advance diet as tolerated. If she is  tolerating PO and having ongoing bowel function without recurrence of obstructive sxs then she may be discharged from a [facility] standpoint.    FEN - NG removed. FLD. ADAT to soft; hypoakelmia, replete  VTE - SCD's, ok to resume xarelto  ID - none Admit - TR[facility] service    LOS: 2 days   I reviewed nursing notes, hospitalist notes, last 24 h vitals and pain scores, last 48 h intake and output, last 24 h labs  and trends, and last 24 h imaging results.  This care required moderate level of medical decision making.   Hosie Spangle, PA-C [facility] Please see Amion for pager number during day hours 7:00am-4:30pm

## 2024-02-07 ENCOUNTER — Inpatient Hospital Stay (HOSPITAL_COMMUNITY): Payer: Medicare Other

## 2024-02-07 DIAGNOSIS — K56609 Unspecified intestinal obstruction, unspecified as to partial versus complete obstruction: Secondary | ICD-10-CM | POA: Diagnosis not present

## 2024-02-07 LAB — BASIC METABOLIC PANEL
Anion gap: 11 (ref 5–15)
BUN: 11 mg/dL (ref 8–23)
CO2: 21 mmol/L — ABNORMAL LOW (ref 22–32)
Calcium: 9 mg/dL (ref 8.9–10.3)
Chloride: 106 mmol/L (ref 98–111)
Creatinine, Ser: 0.58 mg/dL (ref 0.44–1.00)
GFR, Estimated: >60 mL/min (ref >60)
Glucose, Bld: 122 mg/dL — ABNORMAL HIGH (ref 70–99)
Potassium: 4.4 mmol/L (ref 3.5–5.1)
Sodium: 138 mmol/L (ref 135–145)

## 2024-02-07 LAB — CBC
HCT: 36 % (ref 36.0–46.0)
Hemoglobin: 11.3 g/dL — ABNORMAL LOW (ref 12.0–15.0)
MCH: 27.2 pg (ref 26.0–34.0)
MCHC: 31.4 g/dL (ref 30.0–36.0)
MCV: 86.7 fL (ref 80.0–100.0)
Platelets: 247 10*3/uL (ref 150–400)
RBC: 4.15 MIL/uL (ref 3.87–5.11)
RDW: 14.4 % (ref 11.5–15.5)
WBC: 14 10*3/uL — ABNORMAL HIGH (ref 4.0–10.5)
nRBC: 0 % (ref 0.0–0.2)

## 2024-02-07 MED ORDER — BOOST / RESOURCE BREEZE PO LIQD CUSTOM
1.0000 | Freq: Three times a day (TID) | ORAL | Status: DC
Start: 2024-02-07 — End: 2024-02-14
  Administered 2024-02-07 – 2024-02-12 (×10): 1 via ORAL

## 2024-02-07 MED ORDER — AMLODIPINE BESYLATE 5 MG PO TABS
5.0000 mg | ORAL_TABLET | Freq: Every day | ORAL | Status: DC
  Administered 2024-02-07 – 2024-02-13 (×7): 5 mg via ORAL
  Filled 2024-02-07 (×8): qty 1

## 2024-02-07 NOTE — Plan of Care (Signed)

## 2024-02-07 NOTE — Plan of Care (Signed)
   Problem: Education: Goal: Knowledge of General Education information will improve Description Including pain rating scale, medication(s)/side effects and non-pharmacologic comfort measures Outcome: Progressing   Problem: Health Behavior/Discharge Planning: Goal: Ability to manage health-related needs will improve Outcome: Progressing

## 2024-02-07 NOTE — Progress Notes (Signed)
 PROGRESS NOTE  Sherry Holloway  BJY:[id number] DOB: 1928/05/06 DOA: 02/04/2024 PCP: Donato Schultz, DO  Consultants  Brief Narrative: 89 y.o. female with a medical history significant of essential hypertension, hyperlipidemia, GERD, esophageal cancer, diabetes, obstructive sleep apnea, biliary stones status post biliary stent placement back in November last year that presented to the ER with some abdominal pain.  Abdominal pain has been on and off since mid January.  Patient was recently seen in the January where workup was done and discharged home because findings with stable.  Pain has continued to persist.  Patient had about 15 pounds unintentional weight loss severe abdominal pain and right upper quadrant pain.  CT in ER suggested SBO.  Admitted to hospitalist service.  Surgery consulted.  Good bowel movements overnight and NG tube removed hospital day #2.      Assessment & Plan: #1  Small bowel obstruction: - NGT pulled yesterday.  She was also feeling much better yesterday morning. -This a.m., reports no pain but looks more uncomfortable than yesterday.  Also slight bump in WBC.   - still passing flatus.  Liquid stools have ended. Abd benign on exam today.   - hasn't had breakfast yet this AM.   - diet being advanced to softs this AM per surgery.  Will need to ensure abd pain doesn't recur - I'm worried she's stoic and not wanting to complain.  Again, objectively, looks more uncomfortable today compared to yesterday.  WBC bump.  Repeat KUB.   -Of note she has had chronic abdominal pain with pancreatitis and biliary obstruction as noted below.  She is on longstanding PPIs.     #2 chronic calcific pancreatitis:  -  Pain control and supportive care - PPI restarted.     #3 chronic biliary obstruction.:   - defer outpt GI.     #4 GERD:  - PPIs on board.     #5 hyperlipidemia:  -Resume home regimen when taking NPO.  .   #6 history of esophageal cancer:  -In remission.   Continue outpatient oncology follow-up   #7 type 2 diabetes:  -Initiate sliding scale insulin while in the hospital.   #8 chronic pain syndrome:  - Continue chronic pain management.   #9 PE: History of the same in 2022.  Reason she is on eliquis, deemed high risk due to hx/o esophageal CA -SBO resolved and Xarelto has been resumed.  10.  Right subclavian stenosis: - longstanding issue for patient per daughter - more tolerant of higher BPs b/c of this, also per daughter, based on outpt physician recommendations.   11. Hypokalemia: - present on admission, replaced.  This AM at 2.9, replaced orally and in fluids - ongoing bowel movements and losses through prior NGT, though both of these have stopped. - will remove K+ from fluids and stop IVF as she's taking PO  12.  HTN: - present outpt, elevated here as well - On Avapro.  As above, with reported subclavian stenosis, daughter hesitant to lower BP much b/c of this  - PRN hydralazine on board for systolics >180 - BP elevated again this AM.  Has remained elevated this admission.  As above, tolerating a little higher BPs based on subclavian stenosis, follows with cardiology for this.  On review of records, looks like BP has been normal or close to normal most visits.   - adding norvasc now.  Elevated BPs could also be 2/2 ongoing pain.      Pressure Injury 10/29/23 Heel  Anterior;Right Deep Tissue Pressure Injury - Purple or maroon localized area of discolored intact skin or blood-filled blister due to damage of underlying soft tissue from pressure and/or shear. Heel ulcer (Active)  10/29/23 0947  Location: Heel  Location Orientation: Anterior;Right  Staging: Deep Tissue Pressure Injury - Purple or maroon localized area of discolored intact skin or blood-filled blister due to damage of underlying soft tissue from pressure and/or shear.  Wound Description (Comments): Heel ulcer  Present on Admission:    DVT prophylaxis:  Place and  maintain sequential compression device Start: 02/05/24 1041 rivaroxaban (XARELTO) tablet 20 mg  Code Status:   Code Status: Limited: Do not attempt resuscitation (DNR) -DNR-LIMITED -Do Not Intubate/DNI  Family Communication: Spoke at length with daughter Michael Litter on the phone. Level of care: Med-Surg Status is: Inpatient Remains inpatient appropriate because: Small bowel obstruction   Consults called: Surgery  Subjective: This morning when I saw her she was awake and alert and in no pain.  Feeling very relieved that her NG tube and pulled.  She had just finished a popsicle and felt good.  Later in the morning after liquid challenge at lunch however she began having worsening abdominal pain.  Denied this pain being as bad as when she came in but uncomfortable.  Still with liquid bowel movements.  Objective: Vitals:   02/06/24 1802 02/06/24 2210 02/07/24 0217 02/07/24 0601  BP: (!) 184/53 (!) 182/49 (!) 170/53 (!) 186/54  Pulse: 73 74 67 67  Resp: 18 18 16 17   Temp: 98.1 F (36.7 C) 98 F (36.7 C) 98 F (36.7 C) 98 F (36.7 C)  TempSrc: Oral Oral Oral Oral  SpO2: 96% 96% 97% 96%  Weight:      Height:        Intake/Output Summary (Last 24 hours) at 02/07/2024 1003 Last data filed at 02/07/2024 0703 Gross per 24 hour  Intake 1632.85 ml  Output --  Net 1632.85 ml   Filed Weights   02/04/24 1503  Weight: 59 kg   Body mass index is 22.31 kg/m.  Gen: 89 y.o. female in no apparent distress.  Nontoxic Pulm: Non-labored breathing.  Clear to auscultation bilaterally.  CV: Regular rate and rhythm. No murmur, rub, or gallop. No JVD GI: Abdomen soft, nondistended.  Minimal tenderness in epigastrium.  No guarding or rebound. Ext: Warm, no deformities, no pedal edema Skin: No rashes, lesions no ulcers Neuro: Alert and oriented. No focal neurological deficits. Psych: Calm  Judgement and insight appear normal. Mood & affect appropriate.     I have personally reviewed the following  labs and images: CBC: Recent Labs  Lab 02/04/24 1506 02/05/24 0439 02/06/24 0416 02/07/24 0519  WBC 17.0* 11.8* 11.5* 14.0*  HGB 12.0 10.8* 11.3* 11.3*  HCT 37.2 34.3* 35.7* 36.0  MCV 84.2 87.7 86.7 86.7  PLT 264 210 202 247   BMP &GFR Recent Labs  Lab 02/04/24 1506 02/05/24 0439 02/06/24 0416 02/06/24 1104 02/07/24 0519  NA 130* 133* 136  --  138  K 3.5 3.3* 2.9*  --  4.4  CL 98 101 103  --  106  CO2 21* 21* 22  --  21*  GLUCOSE 177* 131* 92  --  122*  BUN 20 15 12   --  11  CREATININE 0.61 0.55 0.47  --  0.58  CALCIUM 9.2 8.6* 8.7*  --  9.0  MG  --   --   --  2.0  --    Estimated Creatinine Clearance:  37.1 mL/min (by C-G formula based on SCr of 0.58 mg/dL). Liver & Pancreas: Recent Labs  Lab 02/04/24 1506 02/05/24 0439  AST 10* 9*  ALT 15 13  ALKPHOS 75 59  BILITOT 0.5 0.5  PROT 6.1* 5.0*  ALBUMIN 2.8* 2.3*   Recent Labs  Lab 02/04/24 1506  LIPASE 19   No results for input(s): "AMMONIA" in the last 168 hours. Diabetic: No results for input(s): "HGBA1C" in the last 72 hours. No results for input(s): "GLUCAP" in the last 168 hours. Cardiac Enzymes: No results for input(s): "CKTOTAL", "CKMB", "CKMBINDEX", "TROPONINI" in the last 168 hours. Recent Labs    08/04/23 1422  PROBNP 251.0*   Coagulation Profile: No results for input(s): "INR", "PROTIME" in the last 168 hours. Thyroid Function Tests: No results for input(s): "TSH", "T4TOTAL", "FREET4", "T3FREE", "THYROIDAB" in the last 72 hours. Lipid Profile: No results for input(s): "CHOL", "HDL", "LDLCALC", "TRIG", "CHOLHDL", "LDLDIRECT" in the last 72 hours. Anemia Panel: No results for input(s): "VITAMINB12", "FOLATE", "FERRITIN", "TIBC", "IRON", "RETICCTPCT" in the last 72 hours. Urine analysis:    Component Value Date/Time   COLORURINE YELLOW 02/04/2024 1506   APPEARANCEUR CLEAR 02/04/2024 1506   LABSPEC 1.015 02/04/2024 1506   PHURINE 5.5 02/04/2024 1506   GLUCOSEU >=500 (A) 02/04/2024 1506    HGBUR NEGATIVE 02/04/2024 1506   BILIRUBINUR NEGATIVE 02/04/2024 1506   BILIRUBINUR negative 08/04/2023 1456   KETONESUR 40 (A) 02/04/2024 1506   PROTEINUR 100 (A) 02/04/2024 1506   UROBILINOGEN 0.2 08/04/2023 1456   NITRITE NEGATIVE 02/04/2024 1506   LEUKOCYTESUR NEGATIVE 02/04/2024 1506   Sepsis Labs: Invalid input(s): "PROCALCITONIN", "LACTICIDVEN"  Microbiology: No results found for this or any previous visit (from the past 240 hours).  Radiology Studies: No results found.   Scheduled Meds:  docusate sodium  100 mg Oral QHS   docusate sodium  200 mg Oral Daily   feeding supplement  1 Container Oral TID BM   irbesartan  75 mg Oral Daily   latanoprost  1 drop Both Eyes QHS   linaclotide  145 mcg Oral QAC breakfast   lipase/protease/amylase  36,000 Units Oral BID AC   mirtazapine  15 mg Oral QHS   morphine  30 mg Oral Q12H   pantoprazole  40 mg Oral Daily   rivaroxaban  20 mg Oral Q supper   sucralfate  1 g Oral BID   Continuous Infusions:     LOS: 3 days   35 minutes with more than 50% spent in reviewing records, counseling patient/family and coordinating care.  Tobey Grim, MD [facility] [email or web address] 02/07/2024, 10:03 AM

## 2024-02-07 NOTE — Evaluation (Signed)
 Physical Therapy Evaluation Patient Details Name: Sherry Holloway MRN: [id number] DOB: 1928-12-04 Today's Date: 02/07/2024  History of Present Illness  Pt admitted from home with c/o abd pain 2* SBO.  Pt with hx of DM, back surgery with spinal cord stimulator implant, R TKR, esophageal CA, uterine CA, and chronic pain  Clinical Impression  Pt admitted as above and presenting with functional mobility limitations 2* generalized weakness, balance deficits and decreased activity tolerance.  This date, pt up to bathroom for toileting with hand hygiene at sink and up to ambulate limited distance in hall.  Pt hopes to progress to dc to daughter's home and could benefit from follow up HHPT to maximize IND and safety.        If plan is discharge home, recommend the following: A little help with walking and/or transfers;A little help with bathing/dressing/bathroom;Help with stairs or ramp for entrance;Assist for transportation;Assistance with cooking/housework   Can travel by private vehicle        Equipment Recommendations None recommended by PT  Recommendations for Other Services  OT consult    Functional Status Assessment Patient has had a recent decline in their functional status and demonstrates the ability to make significant improvements in function in a reasonable and predictable amount of time.     Precautions / Restrictions Precautions Precautions: Fall Restrictions Weight Bearing Restrictions Per Provider Order: No      Mobility  Bed Mobility Overal bed mobility: Needs Assistance Bed Mobility: Supine to Sit, Sit to Supine     Supine to sit: Min assist Sit to supine: Min assist   General bed mobility comments: cues for sequence, use of bedrail and assist to manage LEs    Transfers Overall transfer level: Needs assistance Equipment used: Rolling walker (2 wheels) Transfers: Sit to/from Stand Sit to Stand: Min assist           General transfer comment: Steady  assist with cues for use of UEs to self assist    Ambulation/Gait Ambulation/Gait assistance: Min assist, Contact guard assist Gait Distance (Feet): 110 Feet (and 15' into bathroom) Assistive device: Rolling walker (2 wheels) Gait Pattern/deviations: Step-to pattern, Step-through pattern, Decreased step length - right, Decreased step length - left, Shuffle Gait velocity: decr     General Gait Details: increased time, steady assist, cues for posture and position from Rw  Stairs            Wheelchair Mobility     Tilt Bed    Modified Rankin (Stroke Patients Only)       Balance Overall balance assessment: Needs assistance Sitting-balance support: No upper extremity supported, Feet supported Sitting balance-Leahy Scale: Fair     Standing balance support: Bilateral upper extremity supported Standing balance-Leahy Scale: Poor                               Pertinent Vitals/Pain Pain Assessment Pain Assessment: No/denies pain    Home Living Family/patient expects to be discharged to:: Private residence Living Arrangements: Children Available Help at Discharge: Family;Available PRN/intermittently Type of Home: House Home Access: Stairs to enter   [employer] of Steps: 1   Home Layout: Able to live on main level with bedroom/bathroom Home Equipment: Rollator (4 wheels) Additional Comments: Pt plans to stay with dtr and information above pertains to dtrs home - dtr is RN at [employer]    Prior Function Prior Level of Function : Independent/Modified Independent  Mobility Comments: Pt states she always uses rollator ADLs Comments: Pt was I B/D PTA, Pt cleans and cooks some     Extremity/Trunk Assessment   Upper Extremity Assessment Upper Extremity Assessment: Generalized weakness    Lower Extremity Assessment Lower Extremity Assessment: Generalized weakness    Cervical / Trunk Assessment Cervical / Trunk Assessment:  Kyphotic  Communication   Communication Communication: No apparent difficulties Factors Affecting Communication: Hearing impaired (has hearing aides that work well)    Cognition Arousal: Alert Behavior During Therapy: WFL for tasks assessed/performed   PT - Cognitive impairments: No apparent impairments                         Following commands: Intact       Cueing Cueing Techniques: Verbal cues     General Comments      Exercises     Assessment/Plan    PT Assessment Patient needs continued PT services  PT Problem List Decreased strength;Decreased activity tolerance;Decreased balance;Decreased mobility;Decreased knowledge of use of DME       PT Treatment Interventions DME instruction;Gait training;Stair training;Functional mobility training;Balance training;Therapeutic exercise;Therapeutic activities;Patient/family education    PT Goals (Current goals can be found in the Care Plan section)  Acute Rehab PT Goals Patient Stated Goal: Regain IND PT Goal Formulation: With patient Time For Goal Achievement: 02/21/24 Potential to Achieve Goals: Good    Frequency Min 1X/week     Co-evaluation               AM-PAC PT "6 Clicks" Mobility  Outcome Measure Help needed turning from your back to your side while in a flat bed without using bedrails?: A Little Help needed moving from lying on your back to sitting on the side of a flat bed without using bedrails?: A Little Help needed moving to and from a bed to a chair (including a wheelchair)?: A Little Help needed standing up from a chair using your arms (e.g., wheelchair or bedside chair)?: A Little Help needed to walk in hospital room?: A Little Help needed climbing 3-5 steps with a railing? : A Lot 6 Click Score: 17    End of Session Equipment Utilized During Treatment: Gait belt Activity Tolerance: Patient tolerated treatment well Patient left: in bed;with call bell/phone within reach;with bed alarm  set;with family/visitor present Nurse Communication: Mobility status PT Visit Diagnosis: Muscle weakness (generalized) (M62.81);History of falling (Z91.81);Difficulty in walking, not elsewhere classified (R26.2)    Time: 1610-9604 PT Time Calculation (min) (ACUTE ONLY): 32 min   Charges:   PT Evaluation $PT Eval Low Complexity: 1 Low PT Treatments $Gait Training: 8-22 mins PT General Charges $$ ACUTE PT VISIT: 1 Visit         Mauro Kaufmann PT Acute Rehabilitation Services Pager [phone] Office [phone]   Shaylen Nephew 02/07/2024, 4:01 PM

## 2024-02-07 NOTE — Progress Notes (Signed)
 [facility] Progress Note     Subjective: CC:  NGT removed, patient denies n/v or abdominal pain. + stools and gas.   Objective: Vital signs in last 24 hours: Temp:  [97.9 F (36.6 C)-98.1 F (36.7 C)] 98 F (36.7 C) (02/22 0601) Pulse Rate:  [66-74] 67 (02/22 0601) Resp:  [16-18] 17 (02/22 0601) BP: (160-202)/(44-54) 186/54 (02/22 0601) SpO2:  [94 %-97 %] 96 % (02/22 0601) Last BM Date : 02/06/24  Intake/Output from previous day: 02/21 0701 - 02/22 0700 In: 1030.6 [P.O.:120; I.V.:910.6] Out: 40 [Emesis/NG output:40] Intake/Output this shift: Total I/O In: 602.2 [I.V.:602.2] Out: -   PE: Gen:  Alert, NAD, pleasant Pulm:  Normal effort ORA Abd: Soft, non distended, non tender.  Skin: warm and dry, no rashes  Psych: A&Ox3   Lab Results:  Recent Labs    02/06/24 0416 02/07/24 0519  WBC 11.5* 14.0*  HGB 11.3* 11.3*  HCT 35.7* 36.0  PLT 202 247   BMET Recent Labs    02/06/24 0416 02/07/24 0519  NA 136 138  K 2.9* 4.4  CL 103 106  CO2 22 21*  GLUCOSE 92 122*  BUN 12 11  CREATININE 0.47 0.58  CALCIUM 8.7* 9.0   PT/INR No results for input(s): "LABPROT", "INR" in the last 72 hours. CMP     Component Value Date/Time   NA 138 02/07/2024 0519   K 4.4 02/07/2024 0519   CL 106 02/07/2024 0519   CO2 21 (L) 02/07/2024 0519   GLUCOSE 122 (H) 02/07/2024 0519   BUN 11 02/07/2024 0519   CREATININE 0.58 02/07/2024 0519   CREATININE 0.94 11/07/2023 1422   CALCIUM 9.0 02/07/2024 0519   PROT 5.0 (L) 02/05/2024 0439   ALBUMIN 2.3 (L) 02/05/2024 0439   AST 9 (L) 02/05/2024 0439   AST 12 (L) 04/02/2023 1359   ALT 13 02/05/2024 0439   ALT 20 04/02/2023 1359   ALKPHOS 59 02/05/2024 0439   BILITOT 0.5 02/05/2024 0439   BILITOT 0.3 04/02/2023 1359   GFRNONAA >60 02/07/2024 0519   GFRNONAA 53 (L) 04/02/2023 1359   Lipase     Component Value Date/Time   LIPASE 19 02/04/2024 1506       Studies/Results: DG Abd Portable 1V Result Date:  02/06/2024 CLINICAL DATA:  [id number] SBO (small bowel obstruction) (HCC) [id number] EXAM: PORTABLE ABDOMEN - 1 VIEW COMPARISON:  Abdominal radiograph from earlier today FINDINGS: Interval antegrade transit of oral contrast to the distal colon. Mildly dilated small bowel loops up to the 3 cm diameter, slightly improved. No evidence of pneumatosis or pneumoperitoneum. Right lower lateral abdominal wall spinal stimulator with leads coursing into the thoracic spinal canal. Enteric tube tip in the body of the stomach. Biliary stents noted in the right upper quadrant. Partially visualized left total hip arthroplasty. IMPRESSION: Findings compatible with resolving small bowel obstruction with oral contrast transits to the distal colon. Electronically Signed   By: Delbert Phenix M.D.   On: 02/06/2024 08:15   DG Abd Portable 1V-Small Bowel Obstruction Protocol-initial, 8 hr delay Result Date: 02/06/2024 CLINICAL DATA:  Small bowel protocol EXAM: PORTABLE ABDOMEN - 1 VIEW COMPARISON:  Plain films earlier today.  CT 02/04/2024 FINDINGS: NG tube is in the stomach. Contrast material noted within small bowel loops within the right abdomen. Several small bowel loops remain dilated. No definite opacification of the large bowel currently. IMPRESSION: Contrast material within right small bowel loops without definite passage into colon currently. Mildly prominent small bowel loops remain  compatible with small bowel obstruction. Electronically Signed   By: Charlett Nose M.D.   On: 02/06/2024 00:21   DG Abd Portable 1V Result Date: 02/05/2024 CLINICAL DATA:  NG placement. EXAM: PORTABLE ABDOMEN - 1 VIEW COMPARISON:  CT abdomen pelvis dated 02/04/2024. FINDINGS: Enteric tube with side port and tip in the proximal stomach. Recommend further advancing by additional 5 cm for optimal positioning. IMPRESSION: Enteric tube with tip in the proximal stomach. Recommend further advancing by additional 5 cm for optimal positioning. Electronically  Signed   By: Elgie Collard M.D.   On: 02/05/2024 14:54   DG Abd 1 View Result Date: 02/05/2024 CLINICAL DATA:  NG placement. EXAM: ABDOMEN - 1 VIEW COMPARISON:  CT abdomen pelvis dated 02/04/2024. FINDINGS: Enteric tube with side-port just distal to the GE junction and tip in the proximal stomach. Recommend further advancing by additional 5 cm for optimal positioning. IMPRESSION: Enteric tube with tip in the proximal stomach. Recommend further advancing by additional 5 cm. Electronically Signed   By: Elgie Collard M.D.   On: 02/05/2024 13:13    Anti-infectives: Anti-infectives (From admission, onward)    None        Assessment/Plan  SBO  - CT scan of the abdomen and pelvis showed SBO with transition point in RLQ. No pneumoperitoneum or signs of bowel ischemia on CT. Visible biliary stents noted in place from her history of choledocholithiasis 10/2023. Gallbladder surgically absent.   - SBO protocol >> resolving.    FEN - NG removed 2/21. Advance diet to soft.  VTE - SCD's, back on xarelto  ID - none Dispo - TRH service     LOS: 3 days   I reviewed nursing notes, hospitalist notes, last 24 h vitals and pain scores, last 48 h intake and output, last 24 h labs and trends, and last 24 h imaging results.  This care required moderate level of medical decision making.   Maudry Diego, MD, FACS, [facility] Surgical Oncology and General Surgery.  [facility], [place] [phone] for weekday/non holidays Check [email or web address] for coverage night/weekend/holidays

## 2024-02-08 ENCOUNTER — Inpatient Hospital Stay (HOSPITAL_COMMUNITY): Payer: Medicare Other

## 2024-02-08 DIAGNOSIS — K56609 Unspecified intestinal obstruction, unspecified as to partial versus complete obstruction: Secondary | ICD-10-CM | POA: Diagnosis not present

## 2024-02-08 LAB — BASIC METABOLIC PANEL
Anion gap: 6 (ref 5–15)
BUN: 11 mg/dL (ref 8–23)
CO2: 25 mmol/L (ref 22–32)
Calcium: 9 mg/dL (ref 8.9–10.3)
Chloride: 102 mmol/L (ref 98–111)
Creatinine, Ser: 0.51 mg/dL (ref 0.44–1.00)
GFR, Estimated: >60 mL/min (ref >60)
Glucose, Bld: 122 mg/dL — ABNORMAL HIGH (ref 70–99)
Potassium: 4.2 mmol/L (ref 3.5–5.1)
Sodium: 133 mmol/L — ABNORMAL LOW (ref 135–145)

## 2024-02-08 LAB — CBC
HCT: 34.5 % — ABNORMAL LOW (ref 36.0–46.0)
Hemoglobin: 10.9 g/dL — ABNORMAL LOW (ref 12.0–15.0)
MCH: 27.3 pg (ref 26.0–34.0)
MCHC: 31.6 g/dL (ref 30.0–36.0)
MCV: 86.3 fL (ref 80.0–100.0)
Platelets: 241 10*3/uL (ref 150–400)
RBC: 4 MIL/uL (ref 3.87–5.11)
RDW: 14.3 % (ref 11.5–15.5)
WBC: 10.4 10*3/uL (ref 4.0–10.5)
nRBC: 0 % (ref 0.0–0.2)

## 2024-02-08 NOTE — Progress Notes (Signed)
 [facility] Progress Note     Subjective: CC:  Increased pain and bloating today.  Did have a stool and some gas.    Objective: Vital signs in last 24 hours: Temp:  [97.7 F (36.5 C)-98 F (36.7 C)] 97.9 F (36.6 C) (02/23 0502) Pulse Rate:  [60-66] 60 (02/23 0502) Resp:  [16-18] 18 (02/23 0502) BP: (138-190)/(47-53) 173/47 (02/23 0917) SpO2:  [96 %-97 %] 96 % (02/23 0502) Last BM Date : 02/07/24  Intake/Output from previous day: 02/22 0701 - 02/23 0700 In: 1332.2 [P.O.:730; I.V.:602.2] Out: -  Intake/Output this shift: No intake/output data recorded.  PE: Gen:  Alert, pleasant, looks uncomfortable.  Looks younger than stated age.  Pulm:  Normal effort ORA Abd: Soft, distended, mild diffuse tenderness Skin: warm and dry, no rashes  Psych: A&Ox3   Lab Results:  Recent Labs    02/07/24 0519 02/08/24 0434  WBC 14.0* 10.4  HGB 11.3* 10.9*  HCT 36.0 34.5*  PLT 247 241   BMET Recent Labs    02/07/24 0519 02/08/24 0434  NA 138 133*  K 4.4 4.2  CL 106 102  CO2 21* 25  GLUCOSE 122* 122*  BUN 11 11  CREATININE 0.58 0.51  CALCIUM 9.0 9.0   PT/INR No results for input(s): "LABPROT", "INR" in the last 72 hours. CMP     Component Value Date/Time   NA 133 (L) 02/08/2024 0434   K 4.2 02/08/2024 0434   CL 102 02/08/2024 0434   CO2 25 02/08/2024 0434   GLUCOSE 122 (H) 02/08/2024 0434   BUN 11 02/08/2024 0434   CREATININE 0.51 02/08/2024 0434   CREATININE 0.94 11/07/2023 1422   CALCIUM 9.0 02/08/2024 0434   PROT 5.0 (L) 02/05/2024 0439   ALBUMIN 2.3 (L) 02/05/2024 0439   AST 9 (L) 02/05/2024 0439   AST 12 (L) 04/02/2023 1359   ALT 13 02/05/2024 0439   ALT 20 04/02/2023 1359   ALKPHOS 59 02/05/2024 0439   BILITOT 0.5 02/05/2024 0439   BILITOT 0.3 04/02/2023 1359   GFRNONAA >60 02/08/2024 0434   GFRNONAA 53 (L) 04/02/2023 1359   Lipase     Component Value Date/Time   LIPASE 19 02/04/2024 1506       Studies/Results: DG Abd 1  View Result Date: 02/07/2024 CLINICAL DATA:  Small bowel obstruction EXAM: ABDOMEN - 1 VIEW COMPARISON:  02/06/2024 FINDINGS: Two biliary stents are noted. Oral contrast has progressed somewhat through the colon. No clear evidence of bowel obstruction. NG tube is been removed. IMPRESSION: 1. Some progression of oral contrast in the colon. 2. No evidence of high-grade bowel obstruction. 3. Removal of NG tube. Electronically Signed   By: Genevive Bi M.D.   On: 02/07/2024 14:52    Anti-infectives: Anti-infectives (From admission, onward)    None        Assessment/Plan  SBO  - CT scan of the abdomen and pelvis showed SBO with transition point in RLQ. No pneumoperitoneum or signs of bowel ischemia on CT. Visible biliary stents noted in place from her history of choledocholithiasis 10/2023. Gallbladder surgically absent.   - SBO protocol >> was resolving with contrast in colon, but more distended today with more pain and mild tenderness.  Repeat plain films today.  Consider repeat CT tomorrow.     FEN - NG removed 2/21. Back off on diet.  VTE - SCD's, back on xarelto  ID - none Dispo - TRH service     LOS: 4 days  I reviewed nursing notes, hospitalist notes, last 24 h vitals and pain scores, last 48 h intake and output, last 24 h labs and trends, and last 24 h imaging results.  This care required moderate level of medical decision making.   Maudry Diego, MD, FACS, [facility] Surgical Oncology and General Surgery.  [facility], [place] [phone] for weekday/non holidays Check [email or web address] for coverage night/weekend/holidays

## 2024-02-08 NOTE — Progress Notes (Addendum)
 PROGRESS NOTE  Sherry Holloway  ZOX:[id number] DOB: July 13, 1928 DOA: 02/04/2024 PCP: Donato Schultz, DO  Consultants  Brief Narrative: 89 y.o. female with a medical history significant of essential hypertension, hyperlipidemia, GERD, esophageal cancer, diabetes, obstructive sleep apnea, biliary stones status post biliary stent placement back in November last year that presented to the ER with some abdominal pain.  Abdominal pain has been on and off since mid January.  Patient was recently seen in the January where workup was done and discharged home because findings with stable.  Pain has continued to persist.  Patient had about 15 pounds unintentional weight loss severe abdominal pain and right upper quadrant pain.  CT in ER suggested SBO.  Admitted to hospitalist service.  Surgery consulted.  Good bowel movements overnight and NG tube removed hospital day #2. More uncomfortable today 2/23     Assessment & Plan: #1  Small bowel obstruction: - NGT pulled yesterday.  She was also feeling much better yesterday morning. - seems to have taken a step backwards.  Pain worse this AM.  Received dose of dilaudid this AM due to pain.  Did have liquid stool this AM. - had salmon/full diet last night.  Pain worsened after this.  - Agree with backing off diet.  - plain films pending.  If pain persists tomorrow, will repeat CT - appreciate surgical expertise.  - she remains on colace.  Stopping this today in light of liquid stooling -Of note she has had chronic abdominal pain with pancreatitis and biliary obstruction as noted below.  She is on longstanding PPIs.     #2 chronic calcific pancreatitis:  -  Pain control and supportive care - PPI restarted.     #3 chronic biliary obstruction.:   - defer outpt GI.     #4 GERD:  - PPIs on board.     #5 hyperlipidemia:  -Resume home regimen at DC.   #6 history of esophageal cancer:  -In remission.  Continue outpatient oncology follow-up   #7  type 2 diabetes:  -Initiate sliding scale insulin while in the hospital.   #8 chronic pain syndrome:  - Continue chronic pain management.   #9 PE: History of the same in 2022.  Reason she is on eliquis, deemed high risk due to hx/o esophageal CA -SBO resolved and Xarelto has been resumed.  10.  Right subclavian stenosis: - longstanding issue for patient per daughter - more tolerant of higher BPs b/c of this, also per daughter, based on outpt physician recommendations.  - reviewed outpt notes, I don't see anything about tolerance of high BPs documented by cardiology/PCP  11. Hypokalemia: - resolved.   12.  HTN: - present outpt, elevated here as well - On Avapro.  As above, with reported subclavian stenosis, daughter hesitant to lower BP much b/c of this  - PRN hydralazine on board for systolics >180 -- goal is typically to keep BP lower in light of known atherosclerotic disease.  Continue norvasc, and defer long-term mgnt to primary care/cardiology - Elevated BPs could also be 2/2 ongoing pain.   13. Hyponatremia:  - following, asx - has history of this same, including earlier this admission     Pressure Injury 10/29/23 Heel Anterior;Right Deep Tissue Pressure Injury - Purple or maroon localized area of discolored intact skin or blood-filled blister due to damage of underlying soft tissue from pressure and/or shear. Heel ulcer (Active)  10/29/23 0947  Location: Heel  Location Orientation: Anterior;Right  Staging: Deep Tissue  Pressure Injury - Purple or maroon localized area of discolored intact skin or blood-filled blister due to damage of underlying soft tissue from pressure and/or shear.  Wound Description (Comments): Heel ulcer  Present on Admission:    DVT prophylaxis:  Place and maintain sequential compression device Start: 02/05/24 1041 rivaroxaban (XARELTO) tablet 20 mg  Code Status:   Code Status: Limited: Do not attempt resuscitation (DNR) -DNR-LIMITED -Do Not  Intubate/DNI  Family Communication: Spoke at length with daughter Michael Litter on the phone. Level of care: Med-Surg Status is: Inpatient Remains inpatient appropriate because: Small bowel obstruction   Consults called: Surgery  Subjective: This morning when I saw her she was awake and alert and in no pain.  Feeling very relieved that her NG tube and pulled.  She had just finished a popsicle and felt good.  Later in the morning after liquid challenge at lunch however she began having worsening abdominal pain.  Denied this pain being as bad as when she came in but uncomfortable.  Still with liquid bowel movements.  Objective: Vitals:   02/07/24 1231 02/07/24 1232 02/07/24 1954 02/08/24 0502  BP: (!) 190/53 (!) 190/53 (!) 167/53 (!) 138/53  Pulse: 66  66 60  Resp: 16  18 18   Temp: 98 F (36.7 C)  97.7 F (36.5 C) 97.9 F (36.6 C)  TempSrc: Oral  Oral Oral  SpO2: 97%  96% 96%  Weight:      Height:        Intake/Output Summary (Last 24 hours) at 02/08/2024 0735 Last data filed at 02/07/2024 2100 Gross per 24 hour  Intake 730 ml  Output --  Net 730 ml   Filed Weights   02/04/24 1503  Weight: 59 kg   Body mass index is 22.31 kg/m.  Gen: 89 y.o. female in no apparent distress.  Nontoxic Pulm: Non-labored breathing.  Clear to auscultation bilaterally.  CV: Regular rate and rhythm. No murmur, rub, or gallop. No JVD GI: Abdomen soft, nondistended.  Minimal tenderness in epigastrium.  No guarding or rebound. Ext: Warm, no deformities, no pedal edema Skin: No rashes, lesions no ulcers Neuro: Alert and oriented. No focal neurological deficits. Psych: Calm  Judgement and insight appear normal. Mood & affect appropriate.     I have personally reviewed the following labs and images: CBC: Recent Labs  Lab 02/04/24 1506 02/05/24 0439 02/06/24 0416 02/07/24 0519 02/08/24 0434  WBC 17.0* 11.8* 11.5* 14.0* 10.4  HGB 12.0 10.8* 11.3* 11.3* 10.9*  HCT 37.2 34.3* 35.7* 36.0 34.5*   MCV 84.2 87.7 86.7 86.7 86.3  PLT 264 210 202 247 241   BMP &GFR Recent Labs  Lab 02/04/24 1506 02/05/24 0439 02/06/24 0416 02/06/24 1104 02/07/24 0519 02/08/24 0434  NA 130* 133* 136  --  138 133*  K 3.5 3.3* 2.9*  --  4.4 4.2  CL 98 101 103  --  106 102  CO2 21* 21* 22  --  21* 25  GLUCOSE 177* 131* 92  --  122* 122*  BUN 20 15 12   --  11 11  CREATININE 0.61 0.55 0.47  --  0.58 0.51  CALCIUM 9.2 8.6* 8.7*  --  9.0 9.0  MG  --   --   --  2.0  --   --    Estimated Creatinine Clearance: 37.1 mL/min (by C-G formula based on SCr of 0.51 mg/dL). Liver & Pancreas: Recent Labs  Lab 02/04/24 1506 02/05/24 0439  AST 10* 9*  ALT 15  13  ALKPHOS 75 59  BILITOT 0.5 0.5  PROT 6.1* 5.0*  ALBUMIN 2.8* 2.3*   Recent Labs  Lab 02/04/24 1506  LIPASE 19   No results for input(s): "AMMONIA" in the last 168 hours. Diabetic: No results for input(s): "HGBA1C" in the last 72 hours. No results for input(s): "GLUCAP" in the last 168 hours. Cardiac Enzymes: No results for input(s): "CKTOTAL", "CKMB", "CKMBINDEX", "TROPONINI" in the last 168 hours. Recent Labs    08/04/23 1422  PROBNP 251.0*   Coagulation Profile: No results for input(s): "INR", "PROTIME" in the last 168 hours. Thyroid Function Tests: No results for input(s): "TSH", "T4TOTAL", "FREET4", "T3FREE", "THYROIDAB" in the last 72 hours. Lipid Profile: No results for input(s): "CHOL", "HDL", "LDLCALC", "TRIG", "CHOLHDL", "LDLDIRECT" in the last 72 hours. Anemia Panel: No results for input(s): "VITAMINB12", "FOLATE", "FERRITIN", "TIBC", "IRON", "RETICCTPCT" in the last 72 hours. Urine analysis:    Component Value Date/Time   COLORURINE YELLOW 02/04/2024 1506   APPEARANCEUR CLEAR 02/04/2024 1506   LABSPEC 1.015 02/04/2024 1506   PHURINE 5.5 02/04/2024 1506   GLUCOSEU >=500 (A) 02/04/2024 1506   HGBUR NEGATIVE 02/04/2024 1506   BILIRUBINUR NEGATIVE 02/04/2024 1506   BILIRUBINUR negative 08/04/2023 1456   KETONESUR 40  (A) 02/04/2024 1506   PROTEINUR 100 (A) 02/04/2024 1506   UROBILINOGEN 0.2 08/04/2023 1456   NITRITE NEGATIVE 02/04/2024 1506   LEUKOCYTESUR NEGATIVE 02/04/2024 1506   Sepsis Labs: Invalid input(s): "PROCALCITONIN", "LACTICIDVEN"  Microbiology: No results found for this or any previous visit (from the past 240 hours).  Radiology Studies: DG Abd 1 View Result Date: 02/07/2024 CLINICAL DATA:  Small bowel obstruction EXAM: ABDOMEN - 1 VIEW COMPARISON:  02/06/2024 FINDINGS: Two biliary stents are noted. Oral contrast has progressed somewhat through the colon. No clear evidence of bowel obstruction. NG tube is been removed. IMPRESSION: 1. Some progression of oral contrast in the colon. 2. No evidence of high-grade bowel obstruction. 3. Removal of NG tube. Electronically Signed   By: Genevive Bi M.D.   On: 02/07/2024 14:52     Scheduled Meds:  amLODipine  5 mg Oral Daily   docusate sodium  100 mg Oral QHS   docusate sodium  200 mg Oral Daily   feeding supplement  1 Container Oral TID BM   irbesartan  75 mg Oral Daily   latanoprost  1 drop Both Eyes QHS   lipase/protease/amylase  36,000 Units Oral BID AC   mirtazapine  15 mg Oral QHS   morphine  30 mg Oral Q12H   pantoprazole  40 mg Oral Daily   rivaroxaban  20 mg Oral Q supper   sucralfate  1 g Oral BID   Continuous Infusions:     LOS: 4 days   35 minutes with more than 50% spent in reviewing records, counseling patient/family and coordinating care.  Tobey Grim, MD [facility] [email or web address] 02/08/2024, 7:35 AM

## 2024-02-08 NOTE — Plan of Care (Signed)

## 2024-02-08 NOTE — Plan of Care (Signed)
 Plan of care is reviewed. Pt has been progressing. She is alert and fully oriented x 4, afebrile, stable hemodynamically, normal respiration, no obvious acute distress noted. She is able to sleep well, no complaints overnight.   Problem: Health Behavior/Discharge Planning: Goal: Ability to manage health-related needs will improve Outcome: Progressing   Problem: Clinical Measurements: Goal: Ability to maintain clinical measurements within normal limits will improve Outcome: Progressing Goal: Will remain free from infection Outcome: Progressing Goal: Diagnostic test results will improve Outcome: Progressing Goal: Respiratory complications will improve Outcome: Progressing Goal: Cardiovascular complication will be avoided Outcome: Progressing   Problem: Activity: Goal: Risk for activity intolerance will decrease Outcome: Progressing   Problem: Nutrition: Goal: Adequate nutrition will be maintained Outcome: Progressing   Problem: Elimination: Goal: Will not experience complications related to bowel motility Outcome: Progressing Goal: Will not experience complications related to urinary retention Outcome: Progressing   Problem: Pain Managment: Goal: General experience of comfort will improve and/or be controlled Outcome: Progressing   Problem: Safety: Goal: Ability to remain free from injury will improve Outcome: Progressing   Problem: Skin Integrity: Goal: Risk for impaired skin integrity will decrease Outcome: Progressing   Filiberto Pinks, RN

## 2024-02-09 DIAGNOSIS — K56609 Unspecified intestinal obstruction, unspecified as to partial versus complete obstruction: Secondary | ICD-10-CM | POA: Diagnosis not present

## 2024-02-09 LAB — BASIC METABOLIC PANEL
Anion gap: 9 (ref 5–15)
BUN: 10 mg/dL (ref 8–23)
CO2: 25 mmol/L (ref 22–32)
Calcium: 8.7 mg/dL — ABNORMAL LOW (ref 8.9–10.3)
Chloride: 100 mmol/L (ref 98–111)
Creatinine, Ser: 0.56 mg/dL (ref 0.44–1.00)
GFR, Estimated: >60 mL/min (ref >60)
Glucose, Bld: 127 mg/dL — ABNORMAL HIGH (ref 70–99)
Potassium: 3.8 mmol/L (ref 3.5–5.1)
Sodium: 134 mmol/L — ABNORMAL LOW (ref 135–145)

## 2024-02-09 LAB — CBC
HCT: 35.5 % — ABNORMAL LOW (ref 36.0–46.0)
Hemoglobin: 11.3 g/dL — ABNORMAL LOW (ref 12.0–15.0)
MCH: 27.3 pg (ref 26.0–34.0)
MCHC: 31.8 g/dL (ref 30.0–36.0)
MCV: 85.7 fL (ref 80.0–100.0)
Platelets: 229 10*3/uL (ref 150–400)
RBC: 4.14 MIL/uL (ref 3.87–5.11)
RDW: 14.3 % (ref 11.5–15.5)
WBC: 9.3 10*3/uL (ref 4.0–10.5)
nRBC: 0 % (ref 0.0–0.2)

## 2024-02-09 LAB — C DIFFICILE (CDIFF) QUICK SCRN (NO PCR REFLEX)
C Diff antigen: NEGATIVE
C Diff interpretation: NOT DETECTED
C Diff toxin: NEGATIVE

## 2024-02-09 MED ORDER — DOCUSATE SODIUM 100 MG PO CAPS
100.0000 mg | ORAL_CAPSULE | Freq: Two times a day (BID) | ORAL | Status: DC
  Administered 2024-02-09: 100 mg via ORAL
  Filled 2024-02-09: qty 1

## 2024-02-09 MED ORDER — POLYETHYLENE GLYCOL 3350 17 G PO PACK
17.0000 g | PACK | Freq: Every day | ORAL | Status: DC
  Administered 2024-02-09: 17 g via ORAL
  Filled 2024-02-09: qty 1

## 2024-02-09 NOTE — Progress Notes (Signed)
 Progress Note     Subjective: Pt reports some abdominal soreness but overall improved. Having some loose BMs. Denies nausea or vomiting. Tolerating CLD.   Objective: Vital signs in last 24 hours: Temp:  [98.1 F (36.7 C)-98.6 F (37 C)] 98.2 F (36.8 C) (02/24 0920) Pulse Rate:  [62-70] 70 (02/24 0920) Resp:  [16-17] 17 (02/24 0552) BP: (137-166)/(43-86) 157/54 (02/24 0920) SpO2:  [93 %-97 %] 93 % (02/24 0920) Last BM Date : 02/08/24  Intake/Output from previous day: 02/23 0701 - 02/24 0700 In: 960 [P.O.:960] Out: 800 [Urine:800] Intake/Output this shift: Total I/O In: -  Out: 525 [Urine:525]  PE: Gen:  Alert, pleasant, NAD.  Pulm:  Normal effort ORA Abd: Soft, NT, ND Skin: warm and dry, no rashes  Psych: A&Ox3    Lab Results:  Recent Labs    02/08/24 0434 02/09/24 0425  WBC 10.4 9.3  HGB 10.9* 11.3*  HCT 34.5* 35.5*  PLT 241 229   BMET Recent Labs    02/08/24 0434 02/09/24 0425  NA 133* 134*  K 4.2 3.8  CL 102 100  CO2 25 25  GLUCOSE 122* 127*  BUN 11 10  CREATININE 0.51 0.56  CALCIUM 9.0 8.7*   PT/INR No results for input(s): "LABPROT", "INR" in the last 72 hours. CMP     Component Value Date/Time   NA 134 (L) 02/09/2024 0425   K 3.8 02/09/2024 0425   CL 100 02/09/2024 0425   CO2 25 02/09/2024 0425   GLUCOSE 127 (H) 02/09/2024 0425   BUN 10 02/09/2024 0425   CREATININE 0.56 02/09/2024 0425   CREATININE 0.94 11/07/2023 1422   CALCIUM 8.7 (L) 02/09/2024 0425   PROT 5.0 (L) 02/05/2024 0439   ALBUMIN 2.3 (L) 02/05/2024 0439   AST 9 (L) 02/05/2024 0439   AST 12 (L) 04/02/2023 1359   ALT 13 02/05/2024 0439   ALT 20 04/02/2023 1359   ALKPHOS 59 02/05/2024 0439   BILITOT 0.5 02/05/2024 0439   BILITOT 0.3 04/02/2023 1359   GFRNONAA >60 02/09/2024 0425   GFRNONAA 53 (L) 04/02/2023 1359   Lipase     Component Value Date/Time   LIPASE 19 02/04/2024 1506       Studies/Results: DG Abd 2 Views Result Date: 02/08/2024 CLINICAL  DATA:  Abdominal distention. EXAM: ABDOMEN - 2 VIEW COMPARISON:  KUB 02/07/2024, 02/06/2024, 02/05/2024, CT abdomen pelvis 02/04/2024 FINDINGS: Note is made of small bowel obstruction in the superior right pelvis on 02/04/2024 CT. Two biliary stents are again noted. There is again a generator pack overlying the right flank and spinal cord stimulator leads overlying the central canal of the upper lumbar spine extending off the superior thoracic spine plane of view. Right upper quadrant cholecystectomy clips. Oral contrast is seen within the distal transverse colon and descending colon, similar to prior but likely mildly progressed into the sigmoid colon. Stool is seen within the distal sigmoid colon and rectum. No portal venous gas or pneumatosis. Following bases are clear. Moderate dextrocurvature of the upper lumbar spine with mild-to-moderate multilevel degenerative disc changes. Partial visualization of total left hip arthroplasty. IMPRESSION: Oral contrast is seen within the distal transverse colon and descending colon, similar to prior but likely mildly progressed into the sigmoid colon. Findings again suggest slow progression of oral contrast and improvement in the small bowel obstruction seen on 02/04/2024 CT. Electronically Signed   By: Neita Garnet M.D.   On: 02/08/2024 14:00   DG Abd 1 View Result Date: 02/07/2024  CLINICAL DATA:  Small bowel obstruction EXAM: ABDOMEN - 1 VIEW COMPARISON:  02/06/2024 FINDINGS: Two biliary stents are noted. Oral contrast has progressed somewhat through the colon. No clear evidence of bowel obstruction. NG tube is been removed. IMPRESSION: 1. Some progression of oral contrast in the colon. 2. No evidence of high-grade bowel obstruction. 3. Removal of NG tube. Electronically Signed   By: Genevive Bi M.D.   On: 02/07/2024 14:52    Anti-infectives: Anti-infectives (From admission, onward)    None        Assessment/Plan  SBO vs ileus in setting of Chronic  constipation  -  CT scan of the abdomen and pelvis showed SBO with transition point in RLQ. No pneumoperitoneum or signs of bowel ischemia on CT. Visible biliary stents noted in place from her history of choledocholithiasis 10/2023. Gallbladder surgically absent.  - SBO protocol - contrast passed into colon, repeat film yesterday showed further progression - patient having bowel movements and abdominal exam very benign this AM, tolerating CLD - increased to soft diet, no indication for emergent surgical intervention. Restart home bowel regimen and escalate as needed. Recommend outpatient GI/PCP follow up  - mobility as tolerated  - general surgery will sign off at this time, please call if we can be of further assistance   FEN: soft diet  VTE: xarelto resumed 2/21 ID: no current abx  - per TRH -  HTN HLD OSA Hx if uterine cancer Hx of esophageal cancer GERD T2DM  LOS: 5 days   I reviewed hospitalist notes, last 24 h vitals and pain scores, last 48 h intake and output, last 24 h labs and trends, and last 24 h imaging results.  This care required moderate level of medical decision making.    Juliet Rude, [facility] Surgery 02/09/2024, 10:53 AM Please see Amion for pager number during day hours 7:00am-4:30pm

## 2024-02-09 NOTE — Plan of Care (Signed)

## 2024-02-09 NOTE — Progress Notes (Signed)
 Mobility Specialist - Progress Note   02/09/24 1348  Mobility  Activity Ambulated with assistance to bathroom;Ambulated with assistance in hallway  Level of Assistance Minimal assist, patient does 75% or more  Assistive Device Front wheel walker  Distance Ambulated (ft) 50 ft  Range of Motion/Exercises Active  Activity Response Tolerated well  Mobility Referral Yes  Mobility visit 1 Mobility  Mobility Specialist Start Time (ACUTE ONLY) 1328  Mobility Specialist Stop Time (ACUTE ONLY) 1348  Mobility Specialist Time Calculation (min) (ACUTE ONLY) 20 min   Pt was found in bed and agreeable to ambulate. After bathroom use headed to hallway and grew fatigued with session. At EOS returned to bed with all needs met. Call bell in reach and daughter in room.  Billey Chang Mobility Specialist

## 2024-02-09 NOTE — TOC Progression Note (Signed)
 Transition of Care [facility]) - Progression Note    Patient Details  Name: Sherry Holloway MRN: [id number] Date of Birth: 04/10/1928  [facility] CM/SW Contact  Diona Browner, LCSW Phone Number: 02/09/2024, 10:09 AM  Clinical Narrative:    Pt recommended for HHPT. Pt accepting of HHPT. CSW spoke with Kandee Keen to set up HHPT through [facility]. CSW added to AVS. TOC will continue to follow fro d/c needs.   Expected Discharge Plan: Home/Self Care Barriers to Discharge: Continued Medical Work up  Expected Discharge Plan and Services   Discharge Planning Services: CM Consult   Living arrangements for the past 2 months: Apartment                                       Social Determinants of Health (SDOH) Interventions SDOH Screenings   Food Insecurity: No Food Insecurity (02/05/2024)  Housing: Low Risk  (02/05/2024)  Transportation Needs: No Transportation Needs (02/05/2024)  Utilities: Not At Risk (02/05/2024)  Alcohol Screen: Low Risk  (04/02/2022)  Depression (PHQ2-9): Low Risk  (01/30/2023)  Financial Resource Strain: Low Risk  (04/02/2022)  Physical Activity: Sufficiently Active (04/02/2022)  Social Connections: Moderately Isolated (02/04/2024)  Stress: No Stress Concern Present (04/02/2022)  Tobacco Use: Medium Risk (02/04/2024)    Readmission Risk Interventions    02/05/2024   12:19 PM 10/31/2023    2:26 PM  Readmission Risk Prevention Plan  Transportation Screening Complete Complete  PCP or Specialist Appt within 3-5 Days Complete Complete  HRI or Home Care Consult Complete Complete  Social Work Consult for Recovery Care Planning/Counseling Complete Complete  Palliative Care Screening Not Applicable Not Applicable  Medication Review Oceanographer) Complete Complete

## 2024-02-09 NOTE — Progress Notes (Signed)
 PROGRESS NOTE  Sherry Holloway  MWU:[id number] DOB: January 12, 1928 DOA: 02/04/2024 PCP: Donato Schultz, DO  [facility]  Brief Narrative: 89 y.o. female with a medical history significant of essential hypertension, hyperlipidemia, GERD, esophageal cancer, diabetes, obstructive sleep apnea, biliary stones status post biliary stent placement back in November last year that presented to the ER with some abdominal pain.  Abdominal pain has been on and off since mid January.  Patient was recently seen in the January where workup was done and discharged home because findings with stable.  Pain has continued to persist.  Patient had about 15 pounds unintentional weight loss severe abdominal pain and right upper quadrant pain.  CT in ER suggested SBO.  Admitted to hospitalist service.  Surgery consulted.  Good bowel movements overnight and NG tube removed hospital day #2. More uncomfortable today 2/23     Assessment & Plan: Small bowel obstruction: -Still feels a little uncomfortable.  With continued loose stools that is slowed somewhat. -C. difficile testing ordered yesterday.  Still pending. -Able to eat clear liquid diet this morning.  Denies any nausea or vomiting. -Obstruction clears completely resolved.  Abdominal pain now main issue. -Surgery signed off.  If C. difficile negative and pain persists will consult GI.  Abdominal pain: -Now main issue. -See small bowel obstruction above, C. difficile pending. -We will advance her diet much more slowly this time.   Chronic calcific pancreatitis:  -  Pain control and supportive care - PPI restarted.     Chronic biliary obstruction - defer outpt GI.     GERD - PPIs on board.     Hyperlipidemia -Resume home regimen at DC.   History of soft cancer -In remission.  Continue outpatient oncology follow-up   Diabetes mellitus type 2 -Initiate sliding scale insulin while in the hospital. -Blood glucose has been good while in house.    Chronic pain under - Continue chronic pain management.   History of PE, on Xarelto outpatient History of the same in 2022.  Reason she is on eliquis, deemed high risk due to hx/o esophageal CA -SBO resolved and Xarelto has been resumed.  Right subclavian stenosis - longstanding issue for patient per daughter - more tolerant of higher BPs b/c of this, also per daughter, based on outpt physician recommendations.  - reviewed outpt notes, I don't see anything about tolerance of high BPs documented by cardiology/PCP  Hypokalemia - resolved.   Hypertension -Still hypertensive but better since addition of Norvasc.  Continuing home ARB.  13. Hyponatremia:  - following, asx.  Presumptive hypotonic due to decreased food/liquid intake. - has history of this same, including earlier this admission     Pressure Injury 10/29/23 Heel Anterior;Right Deep Tissue Pressure Injury - Purple or maroon localized area of discolored intact skin or blood-filled blister due to damage of underlying soft tissue from pressure and/or shear. Heel ulcer (Active)  10/29/23 0947  Location: Heel  Location Orientation: Anterior;Right  Staging: Deep Tissue Pressure Injury - Purple or maroon localized area of discolored intact skin or blood-filled blister due to damage of underlying soft tissue from pressure and/or shear.  Wound Description (Comments): Heel ulcer  Present on Admission:    DVT prophylaxis:  Place and maintain sequential compression device Start: 02/05/24 1041 rivaroxaban (XARELTO) tablet 20 mg  Code Status:   Code Status: Limited: Do not attempt resuscitation (DNR) -DNR-LIMITED -Do Not Intubate/DNI  Family Communication: Spoke at length with daughter Jodette  Level of care: Med-Surg Status is: Inpatient  Remains inpatient appropriate because: Small bowel obstruction Dispo: Pending resolution of abdominal pain.  C. difficile pending   Consults called: Surgery  Subjective: Still uncomfortable but  better today than yesterday.  Still with loose stools.  She describes these as yellowish.  No fevers or chills.  No nausea or vomiting.  Tolerating clear liquid diet this morning.  Objective: Vitals:   02/09/24 0552 02/09/24 0915 02/09/24 0920 02/09/24 1147  BP: (!) 158/86 (!) 157/54 (!) 157/54 (!) 154/118  Pulse: 66  70 65  Resp: 17   18  Temp: 98.6 F (37 C)  98.2 F (36.8 C) 98.5 F (36.9 C)  TempSrc: Oral   Oral  SpO2: 95%  93% 96%  Weight:      Height:        Intake/Output Summary (Last 24 hours) at 02/09/2024 1204 Last data filed at 02/09/2024 0981 Gross per 24 hour  Intake 480 ml  Output 1325 ml  Net -845 ml   Filed Weights   02/04/24 1503  Weight: 59 kg   Body mass index is 22.31 kg/m.  Gen: 89 y.o. female in no apparent distress.  Nontoxic Pulm: Non-labored breathing.  Clear to auscultation bilaterally.  CV: Regular rate and rhythm. No murmur, rub, or gallop. No JVD GI: Abdomen soft, nondistended.  Minimal tenderness in epigastrium.  No guarding or rebound. Ext: Warm, no deformities, no pedal edema Skin: No rashes, lesions no ulcers Neuro: Alert and oriented. No focal neurological deficits. Psych: Calm  Judgement and insight appear normal. Mood & affect appropriate.     I have personally reviewed the following labs and images: CBC: Recent Labs  Lab 02/05/24 0439 02/06/24 0416 02/07/24 0519 02/08/24 0434 02/09/24 0425  WBC 11.8* 11.5* 14.0* 10.4 9.3  HGB 10.8* 11.3* 11.3* 10.9* 11.3*  HCT 34.3* 35.7* 36.0 34.5* 35.5*  MCV 87.7 86.7 86.7 86.3 85.7  PLT 210 202 247 241 229   BMP &GFR Recent Labs  Lab 02/05/24 0439 02/06/24 0416 02/06/24 1104 02/07/24 0519 02/08/24 0434 02/09/24 0425  NA 133* 136  --  138 133* 134*  K 3.3* 2.9*  --  4.4 4.2 3.8  CL 101 103  --  106 102 100  CO2 21* 22  --  21* 25 25  GLUCOSE 131* 92  --  122* 122* 127*  BUN 15 12  --  11 11 10   CREATININE 0.55 0.47  --  0.58 0.51 0.56  CALCIUM 8.6* 8.7*  --  9.0 9.0 8.7*   MG  --   --  2.0  --   --   --    Estimated Creatinine Clearance: 37.1 mL/min (by C-G formula based on SCr of 0.56 mg/dL). Liver & Pancreas: Recent Labs  Lab 02/04/24 1506 02/05/24 0439  AST 10* 9*  ALT 15 13  ALKPHOS 75 59  BILITOT 0.5 0.5  PROT 6.1* 5.0*  ALBUMIN 2.8* 2.3*   Recent Labs  Lab 02/04/24 1506  LIPASE 19   No results for input(s): "AMMONIA" in the last 168 hours. Diabetic: No results for input(s): "HGBA1C" in the last 72 hours. No results for input(s): "GLUCAP" in the last 168 hours. Cardiac Enzymes: No results for input(s): "CKTOTAL", "CKMB", "CKMBINDEX", "TROPONINI" in the last 168 hours. Recent Labs    08/04/23 1422  PROBNP 251.0*   Coagulation Profile: No results for input(s): "INR", "PROTIME" in the last 168 hours. Thyroid Function Tests: No results for input(s): "TSH", "T4TOTAL", "FREET4", "T3FREE", "THYROIDAB" in the last 72 hours.  Lipid Profile: No results for input(s): "CHOL", "HDL", "LDLCALC", "TRIG", "CHOLHDL", "LDLDIRECT" in the last 72 hours. Anemia Panel: No results for input(s): "VITAMINB12", "FOLATE", "FERRITIN", "TIBC", "IRON", "RETICCTPCT" in the last 72 hours. Urine analysis:    Component Value Date/Time   COLORURINE YELLOW 02/04/2024 1506   APPEARANCEUR CLEAR 02/04/2024 1506   LABSPEC 1.015 02/04/2024 1506   PHURINE 5.5 02/04/2024 1506   GLUCOSEU >=500 (A) 02/04/2024 1506   HGBUR NEGATIVE 02/04/2024 1506   BILIRUBINUR NEGATIVE 02/04/2024 1506   BILIRUBINUR negative 08/04/2023 1456   KETONESUR 40 (A) 02/04/2024 1506   PROTEINUR 100 (A) 02/04/2024 1506   UROBILINOGEN 0.2 08/04/2023 1456   NITRITE NEGATIVE 02/04/2024 1506   LEUKOCYTESUR NEGATIVE 02/04/2024 1506   Sepsis Labs: Invalid input(s): "PROCALCITONIN", "LACTICIDVEN"  Microbiology: No results found for this or any previous visit (from the past 240 hours).  Radiology Studies: No results found.    Scheduled Meds:  amLODipine  5 mg Oral Daily   docusate sodium   100 mg Oral BID   feeding supplement  1 Container Oral TID BM   irbesartan  75 mg Oral Daily   latanoprost  1 drop Both Eyes QHS   lipase/protease/amylase  36,000 Units Oral BID AC   mirtazapine  15 mg Oral QHS   morphine  30 mg Oral Q12H   pantoprazole  40 mg Oral Daily   polyethylene glycol  17 g Oral Daily   rivaroxaban  20 mg Oral Q supper   sucralfate  1 g Oral BID   Continuous Infusions:     LOS: 5 days   35 minutes with more than 50% spent in reviewing records, counseling patient/family and coordinating care.  Tobey Grim, MD [facility] [email or web address] 02/09/2024, 12:04 PM

## 2024-02-10 ENCOUNTER — Other Ambulatory Visit: Payer: Self-pay | Admitting: Family Medicine

## 2024-02-10 ENCOUNTER — Inpatient Hospital Stay (HOSPITAL_COMMUNITY): Payer: Medicare Other

## 2024-02-10 DIAGNOSIS — K56609 Unspecified intestinal obstruction, unspecified as to partial versus complete obstruction: Secondary | ICD-10-CM | POA: Diagnosis not present

## 2024-02-10 MED ORDER — ACETAMINOPHEN 325 MG PO TABS
650.0000 mg | ORAL_TABLET | Freq: Four times a day (QID) | ORAL | Status: DC | PRN
  Administered 2024-02-10 – 2024-02-11 (×3): 650 mg via ORAL
  Filled 2024-02-10 (×3): qty 2

## 2024-02-10 MED ORDER — IOHEXOL 300 MG/ML  SOLN
30.0000 mL | Freq: Once | INTRAMUSCULAR | Status: AC | PRN
  Administered 2024-02-14: 30 mL via ORAL

## 2024-02-10 MED ORDER — IOHEXOL 300 MG/ML  SOLN
100.0000 mL | Freq: Once | INTRAMUSCULAR | Status: AC | PRN
  Administered 2024-02-10: 100 mL via INTRAVENOUS

## 2024-02-10 NOTE — Progress Notes (Signed)
 PROGRESS NOTE  Sherry Holloway  ZHY:[id number] DOB: September 19, 1928 DOA: 02/04/2024 PCP: Donato Schultz, DO  [facility]  Brief Narrative: 89 y.o. female with a medical history significant of essential hypertension, hyperlipidemia, GERD, esophageal cancer, diabetes, obstructive sleep apnea, biliary stones status post biliary stent placement back in November last year that presented to the ER with some abdominal pain.  Abdominal pain has been on and off since mid January.  Patient was recently seen in the January where workup was done and discharged home because findings with stable.  Pain has continued to persist.  Patient had about 15 pounds unintentional weight loss severe abdominal pain and right upper quadrant pain.  CT in ER suggested SBO.  Admitted to hospitalist service.  Surgery consulted.  Good bowel movements overnight and NG tube removed hospital day #2. More uncomfortable 2/23, pain better 2/25 but with continued distention.    Assessment & Plan: Abdominal pain: -Now main issue.  Worsened after we rapidly advanced her diet over the weekend.  Removed her back to liquids and soft and she has been doing better. -Pain has been coming and going.  She has been paramount her pain past 2 days.  She is much less pain today but her abdominal distention is more notable today than yesterday. -Repeat CT scan with contrast today as her C. difficile was negative.  She is continue to have loose stools. -For some reason she was written for both MiraLAX and docusate yesterday after this have been discontinued over the weekend.  This was deafly not helping her loose stools and it has been stopped today. -Pending CT scan we may need to get gastroenterology involved.  They were involved early in her hospitalization but signed off after obstruction became more clear and surgery took over.  Small bowel obstruction: -Resolved Surgery has signed off.  Chronic calcific pancreatitis:  -  Pain control and  supportive care - PPI restarted.   -Likely contributing some to abdominal pain above but this appears to be much more acute than her chronic pancreatitis.   Chronic biliary obstruction - defer outpt GI.     GERD - PPIs on board.     Hyperlipidemia -Resume home regimen at DC.   History of soft cancer -In remission.  Continue outpatient oncology follow-up   Diabetes mellitus type 2 -Initiate sliding scale insulin while in the hospital. -Blood glucose has been good while in house.   Chronic pain under - Continue chronic pain management.   History of PE, on Xarelto outpatient History of the same in 2022.  Reason she is on eliquis, deemed high risk due to hx/o esophageal CA -SBO resolved and Xarelto has been resumed.  Right subclavian stenosis - longstanding issue for patient per daughter - more tolerant of higher BPs b/c of this, also per daughter, based on outpt physician recommendations.  Daughter also reports that outpatient doctors have been keeping her blood pressure higher than usual because of the stenosis. - reviewed outpt notes, I don't see anything about tolerance of high BPs documented by cardiology/PCP.  Will control her blood pressure as we have been here in the hospital, added Norvasc several days ago to help with blood pressure control.  Hypokalemia - resolved.   Hypertension -Still hypertensive but better since addition of Norvasc.  Continuing home ARB.  13. Hyponatremia:  - following, asx.  Presumptive hypotonic due to decreased food/liquid intake. - has history of this same, including earlier this admission     Pressure Injury 10/29/23  Heel Anterior;Right Deep Tissue Pressure Injury - Purple or maroon localized area of discolored intact skin or blood-filled blister due to damage of underlying soft tissue from pressure and/or shear. Heel ulcer (Active)  10/29/23 0947  Location: Heel  Location Orientation: Anterior;Right  Staging: Deep Tissue Pressure Injury  - Purple or maroon localized area of discolored intact skin or blood-filled blister due to damage of underlying soft tissue from pressure and/or shear.  Wound Description (Comments): Heel ulcer  Present on Admission:    DVT prophylaxis:  Place and maintain sequential compression device Start: 02/05/24 1041 rivaroxaban (XARELTO) tablet 20 mg  Code Status:   Code Status: Limited: Do not attempt resuscitation (DNR) -DNR-LIMITED -Do Not Intubate/DNI  Family Communication: Spoke at length with daughter Jodette  Level of care: Med-Surg Status is: Inpatient Remains inpatient appropriate because: Small bowel obstruction Dispo: Pending resolution of abdominal pain.  CT scan pending today.   Consults called: Surgery  Subjective: Pain better today than yesterday.  Feels more distended however today than she has.  She is not been able to move past liquids from a diet standpoint.  Continues to have frequent loose stools.  She has finished her oral contrast  Objective: Vitals:   02/10/24 0437 02/10/24 1015 02/10/24 1017 02/10/24 1250  BP: (!) 145/54 (!) 146/55 (!) 146/55 (!) 172/63  Pulse: 73  71 67  Resp: 18  20 18   Temp: 98.5 F (36.9 C)  98.2 F (36.8 C) 98.4 F (36.9 C)  TempSrc: Oral  Oral Oral  SpO2: 93%  95% 93%  Weight:      Height:        Intake/Output Summary (Last 24 hours) at 02/10/2024 1338 Last data filed at 02/10/2024 0810 Gross per 24 hour  Intake 1120 ml  Output --  Net 1120 ml   Filed Weights   02/04/24 1503  Weight: 59 kg   Body mass index is 22.31 kg/m.  Gen: 89 y.o. female in no apparent distress.  Nontoxic Pulm: Non-labored breathing.  Clear to auscultation bilaterally.  CV: Regular rate and rhythm. No murmur, rub, or gallop. No JVD GI: Abdomen soft, more distended today than she has been.  Minimal tenderness in epigastrium.  No guarding or rebound. Ext: Warm, no deformities, no pedal edema Skin: No rashes, lesions no ulcers Neuro: Alert and oriented. No  focal neurological deficits. Psych: Calm  Judgement and insight appear normal. Mood & affect appropriate.     I have personally reviewed the following labs and images: CBC: Recent Labs  Lab 02/05/24 0439 02/06/24 0416 02/07/24 0519 02/08/24 0434 02/09/24 0425  WBC 11.8* 11.5* 14.0* 10.4 9.3  HGB 10.8* 11.3* 11.3* 10.9* 11.3*  HCT 34.3* 35.7* 36.0 34.5* 35.5*  MCV 87.7 86.7 86.7 86.3 85.7  PLT 210 202 247 241 229   BMP &GFR Recent Labs  Lab 02/05/24 0439 02/06/24 0416 02/06/24 1104 02/07/24 0519 02/08/24 0434 02/09/24 0425  NA 133* 136  --  138 133* 134*  K 3.3* 2.9*  --  4.4 4.2 3.8  CL 101 103  --  106 102 100  CO2 21* 22  --  21* 25 25  GLUCOSE 131* 92  --  122* 122* 127*  BUN 15 12  --  11 11 10   CREATININE 0.55 0.47  --  0.58 0.51 0.56  CALCIUM 8.6* 8.7*  --  9.0 9.0 8.7*  MG  --   --  2.0  --   --   --  Estimated Creatinine Clearance: 37.1 mL/min (by C-G formula based on SCr of 0.56 mg/dL). Liver & Pancreas: Recent Labs  Lab 02/04/24 1506 02/05/24 0439  AST 10* 9*  ALT 15 13  ALKPHOS 75 59  BILITOT 0.5 0.5  PROT 6.1* 5.0*  ALBUMIN 2.8* 2.3*   Recent Labs  Lab 02/04/24 1506  LIPASE 19   No results for input(s): "AMMONIA" in the last 168 hours. Diabetic: No results for input(s): "HGBA1C" in the last 72 hours. No results for input(s): "GLUCAP" in the last 168 hours. Cardiac Enzymes: No results for input(s): "CKTOTAL", "CKMB", "CKMBINDEX", "TROPONINI" in the last 168 hours. Recent Labs    08/04/23 1422  PROBNP 251.0*   Coagulation Profile: No results for input(s): "INR", "PROTIME" in the last 168 hours. Thyroid Function Tests: No results for input(s): "TSH", "T4TOTAL", "FREET4", "T3FREE", "THYROIDAB" in the last 72 hours. Lipid Profile: No results for input(s): "CHOL", "HDL", "LDLCALC", "TRIG", "CHOLHDL", "LDLDIRECT" in the last 72 hours. Anemia Panel: No results for input(s): "VITAMINB12", "FOLATE", "FERRITIN", "TIBC", "IRON", "RETICCTPCT"  in the last 72 hours. Urine analysis:    Component Value Date/Time   COLORURINE YELLOW 02/04/2024 1506   APPEARANCEUR CLEAR 02/04/2024 1506   LABSPEC 1.015 02/04/2024 1506   PHURINE 5.5 02/04/2024 1506   GLUCOSEU >=500 (A) 02/04/2024 1506   HGBUR NEGATIVE 02/04/2024 1506   BILIRUBINUR NEGATIVE 02/04/2024 1506   BILIRUBINUR negative 08/04/2023 1456   KETONESUR 40 (A) 02/04/2024 1506   PROTEINUR 100 (A) 02/04/2024 1506   UROBILINOGEN 0.2 08/04/2023 1456   NITRITE NEGATIVE 02/04/2024 1506   LEUKOCYTESUR NEGATIVE 02/04/2024 1506   Sepsis Labs: Invalid input(s): "PROCALCITONIN", "LACTICIDVEN"  Microbiology: Recent Results (from the past 240 hours)  C Difficile Quick Screen (NO PCR Reflex)     Status: None   Collection Time: 02/09/24  1:48 PM   Specimen: STOOL  Result Value Ref Range Status   C Diff antigen NEGATIVE NEGATIVE Final   C Diff toxin NEGATIVE NEGATIVE Final   C Diff interpretation No C. difficile detected.  Final    Comment: Performed at [facility] [address], [place], [place] [address]    Radiology Studies: No results found.    Scheduled Meds:  amLODipine  5 mg Oral Daily   feeding supplement  1 Container Oral TID BM   irbesartan  75 mg Oral Daily   latanoprost  1 drop Both Eyes QHS   lipase/protease/amylase  36,000 Units Oral BID AC   mirtazapine  15 mg Oral QHS   morphine  30 mg Oral Q12H   pantoprazole  40 mg Oral Daily   rivaroxaban  20 mg Oral Q supper   sucralfate  1 g Oral BID   Continuous Infusions:     LOS: 6 days   35 minutes with more than 50% spent in reviewing records, counseling patient/family and coordinating care.  Tobey Grim, MD [facility] [email or web address] 02/10/2024, 1:38 PM

## 2024-02-10 NOTE — Progress Notes (Signed)
   02/10/24 1612  PT Visit Information  Reason Eval/Treat Not Completed Pain limiting ability to participate (pt lethargic, in pain, and with family, defer session x2 this day)   Madaline Guthrie, PT Acute Rehabilitation Services Office: [phone] 02/10/2024

## 2024-02-10 NOTE — Progress Notes (Signed)
 Delivered oral contrast to patient. Instructions included to drink on bottle per hour before the CT could be completed.

## 2024-02-10 NOTE — Plan of Care (Signed)
   Problem: Clinical Measurements: Goal: Will remain free from infection Outcome: Progressing Goal: Diagnostic test results will improve Outcome: Progressing Goal: Respiratory complications will improve Outcome: Progressing Goal: Cardiovascular complication will be avoided Outcome: Progressing   Problem: Activity: Goal: Risk for activity intolerance will decrease Outcome: Progressing   Problem: Nutrition: Goal: Adequate nutrition will be maintained Outcome: Progressing   Problem: Coping: Goal: Level of anxiety will decrease Outcome: Progressing   Problem: Elimination: Goal: Will not experience complications related to bowel motility Outcome: Progressing Goal: Will not experience complications related to urinary retention Outcome: Progressing   Problem: Pain Managment: Goal: General experience of comfort will improve and/or be controlled Outcome: Progressing   Problem: Safety: Goal: Ability to remain free from injury will improve Outcome: Progressing   Problem: Skin Integrity: Goal: Risk for impaired skin integrity will decrease Outcome: Progressing

## 2024-02-10 NOTE — Telephone Encounter (Signed)
 Procedure:*** Procedure date: *** Procedure location: *** Arrival Time: *** Spoke with the patient Y/N: N Any prep concerns? ***  Has the patient obtained the prep from the pharmacy ? *** Do you have a care partner and transportation: *** Any additional concerns? ***  Spoke with Reita daughter, she is currently in the hospital with abdominal pain. She states for [place] to call back they still have time to make the appointment.

## 2024-02-10 NOTE — Plan of Care (Signed)

## 2024-02-11 DIAGNOSIS — R140 Abdominal distension (gaseous): Secondary | ICD-10-CM

## 2024-02-11 DIAGNOSIS — G8929 Other chronic pain: Secondary | ICD-10-CM | POA: Diagnosis not present

## 2024-02-11 DIAGNOSIS — K2100 Gastro-esophageal reflux disease with esophagitis, without bleeding: Secondary | ICD-10-CM

## 2024-02-11 DIAGNOSIS — I10 Essential (primary) hypertension: Secondary | ICD-10-CM

## 2024-02-11 DIAGNOSIS — E1165 Type 2 diabetes mellitus with hyperglycemia: Secondary | ICD-10-CM

## 2024-02-11 DIAGNOSIS — D72829 Elevated white blood cell count, unspecified: Secondary | ICD-10-CM | POA: Diagnosis not present

## 2024-02-11 DIAGNOSIS — C154 Malignant neoplasm of middle third of esophagus: Secondary | ICD-10-CM | POA: Diagnosis not present

## 2024-02-11 DIAGNOSIS — G4733 Obstructive sleep apnea (adult) (pediatric): Secondary | ICD-10-CM

## 2024-02-11 DIAGNOSIS — G894 Chronic pain syndrome: Secondary | ICD-10-CM

## 2024-02-11 DIAGNOSIS — K56609 Unspecified intestinal obstruction, unspecified as to partial versus complete obstruction: Secondary | ICD-10-CM | POA: Diagnosis not present

## 2024-02-11 DIAGNOSIS — E1142 Type 2 diabetes mellitus with diabetic polyneuropathy: Secondary | ICD-10-CM

## 2024-02-11 DIAGNOSIS — R1031 Right lower quadrant pain: Secondary | ICD-10-CM | POA: Diagnosis not present

## 2024-02-11 LAB — COMPREHENSIVE METABOLIC PANEL
ALT: 15 U/L (ref 0–44)
AST: 8 U/L — ABNORMAL LOW (ref 15–41)
Albumin: 2.4 g/dL — ABNORMAL LOW (ref 3.5–5.0)
Alkaline Phosphatase: 62 U/L (ref 38–126)
Anion gap: 10 (ref 5–15)
BUN: 12 mg/dL (ref 8–23)
CO2: 24 mmol/L (ref 22–32)
Calcium: 8.8 mg/dL — ABNORMAL LOW (ref 8.9–10.3)
Chloride: 96 mmol/L — ABNORMAL LOW (ref 98–111)
Creatinine, Ser: 0.32 mg/dL — ABNORMAL LOW (ref 0.44–1.00)
GFR, Estimated: >60 mL/min (ref >60)
Glucose, Bld: 158 mg/dL — ABNORMAL HIGH (ref 70–99)
Potassium: 3.1 mmol/L — ABNORMAL LOW (ref 3.5–5.1)
Sodium: 130 mmol/L — ABNORMAL LOW (ref 135–145)
Total Bilirubin: 0.5 mg/dL (ref 0.0–1.2)
Total Protein: 5.3 g/dL — ABNORMAL LOW (ref 6.5–8.1)

## 2024-02-11 LAB — CBC
HCT: 35.3 % — ABNORMAL LOW (ref 36.0–46.0)
Hemoglobin: 11.7 g/dL — ABNORMAL LOW (ref 12.0–15.0)
MCH: 27.9 pg (ref 26.0–34.0)
MCHC: 33.1 g/dL (ref 30.0–36.0)
MCV: 84.2 fL (ref 80.0–100.0)
Platelets: 259 10*3/uL (ref 150–400)
RBC: 4.19 MIL/uL (ref 3.87–5.11)
RDW: 14.2 % (ref 11.5–15.5)
WBC: 19 10*3/uL — ABNORMAL HIGH (ref 4.0–10.5)
nRBC: 0 % (ref 0.0–0.2)

## 2024-02-11 MED ORDER — ACETAMINOPHEN 325 MG PO TABS
650.0000 mg | ORAL_TABLET | Freq: Four times a day (QID) | ORAL | Status: DC | PRN
  Administered 2024-02-20: 650 mg via ORAL
  Filled 2024-02-11: qty 2

## 2024-02-11 MED ORDER — OXYCODONE HCL 5 MG PO TABS
5.0000 mg | ORAL_TABLET | ORAL | Status: DC | PRN
  Administered 2024-02-11 – 2024-02-12 (×2): 5 mg via ORAL
  Filled 2024-02-11 (×2): qty 1

## 2024-02-11 NOTE — Progress Notes (Signed)
 Progress Note   Patient: Sherry Holloway DOB: Jun 05, 1928 DOA: 02/04/2024     7 DOS: the patient was seen and examined on 02/11/2024   Brief hospital course: Sherry Revelle. Holloway is a 89 y.o. female with a medical history significant of essential hypertension, hyperlipidemia, GERD, esophageal cancer, diabetes, obstructive sleep apnea, biliary stones status post biliary stent placement back in November last year that presented to the ER with some abdominal pain.  Abdominal pain has been on and off since mid January.  Patient was recently seen in the January where workup was done and discharged home because findings with stable.  Pain has continued to persist.  Patient had about 15 pounds unintentional weight loss severe abdominal pain and right upper quadrant pain.  CT in ER suggested SBO.  Admitted to hospitalist service.  Surgery consulted.  Good bowel movements overnight and NG tube removed hospital day #2. She has continued distention with oral intake. Daughter at bedside worried asks for GI input.  Assessment and Plan: Abdominal pain/ distension: Diffuse bowel dilation on CT abdomen. SBO resolved. Gradual advancement of her diet advised.. On and off pain, distension per daughter. Asks for GI evaluation as it is been more than a week she has been suffering. She usually is active and eats good, daughter is worried about her. Understands no surgical intervention is needed. Surgery team signed off. Continue bowel regimen, Reglan, simethicone.   Chronic calcific pancreatitis:  Continue pain control and supportive care Advance diet as tolerated. Resumed PPI.   Chronic biliary obstruction Outpt GI follow up.   GERD Continue PPI, simethicone.     Hyperlipidemia Resume statin at DC.   History of soft cancer Continue outpatient oncology follow-up   Diabetes mellitus type 2 Continue sliding scale insulin while in the hospital. Blood glucose has been stable.   Chronic pain  under Continue chronic pain management.   History of PE, on Xarelto outpatient SBO resolved and Xarelto has been resumed.   Right subclavian stenosis Longstanding issue for patient per daughter More tolerant of higher BPs b/c of this, also per daughter, based on outpt physician recommendations.  Daughter also reports that outpatient doctors have been keeping her blood pressure higher than usual because of the stenosis. Per prior hospitalist- reviewed outpt notes, I don't see anything about tolerance of high BPs documented by cardiology/PCP.  Will control her blood pressure as we have been here in the hospital, added Norvasc several days ago to help with blood pressure control.   Hypokalemia Oral replacement ordered. Continue to monitor daily electrolytes.   Hypertension Continue Norvasc, home dose ARB.   13. Hyponatremia:  Hypotonic. Encourage oral fluids. Trend Na.        Out of bed to chair. Incentive spirometry. Nursing supportive care. Fall, aspiration precautions. DVT prophylaxis   Code Status: Limited: Do not attempt resuscitation (DNR) -DNR-LIMITED -Do Not Intubate/DNI   Subjective: Patient is seen and examined today morning. She is weak. Has been eating but get abdominal distension, discomfort. Has liquid stools. Daughter at bedside.  Physical Exam: Vitals:   02/10/24 2009 02/11/24 0512 02/11/24 1205 02/11/24 1300  BP: (!) 156/56 (!) 150/52 (!) 178/51   Pulse: 68 66 70   Resp: 18 18 18    Temp: 98.1 F (36.7 C) 98.5 F (36.9 C) 97.9 F (36.6 C)   TempSrc: Oral Oral Oral   SpO2: 93% 96% 94%   Weight:    60.1 kg  Height:        General -  Elderly ill Caucasian female, no apparent distress HEENT - PERRLA, EOMI, atraumatic head, non tender sinuses. Lung - Clear, basal rales, rhonchi, wheezes. Heart - S1, S2 heard, no murmurs, rubs, trace pedal edema. Abdomen - Soft, non tender, distended, bowel sounds sluggish Neuro - Alert, awake and oriented x 3, non  focal exam. Skin - Warm and dry.  Data Reviewed:      Latest Ref Rng & Units 02/11/2024    4:42 AM 02/09/2024    4:25 AM 02/08/2024    4:34 AM  CBC  WBC 4.0 - 10.5 K/uL 19.0  9.3  10.4   Hemoglobin 12.0 - 15.0 g/dL 16.1  09.6  04.5   Hematocrit 36.0 - 46.0 % 35.3  35.5  34.5   Platelets 150 - 400 K/uL 259  229  241       Latest Ref Rng & Units 02/11/2024    4:42 AM 02/09/2024    4:25 AM 02/08/2024    4:34 AM  BMP  Glucose 70 - 99 mg/dL 409  811  914   BUN 8 - 23 mg/dL 12  10  11    Creatinine 0.44 - 1.00 mg/dL 7.82  9.56  2.13   Sodium 135 - 145 mmol/L 130  134  133   Potassium 3.5 - 5.1 mmol/L 3.1  3.8  4.2   Chloride 98 - 111 mmol/L 96  100  102   CO2 22 - 32 mmol/L 24  25  25    Calcium 8.9 - 10.3 mg/dL 8.8  8.7  9.0    CT ABDOMEN PELVIS W CONTRAST Result Date: 02/10/2024 CLINICAL DATA:  Abdominal pain, resolved small-bowel obstruction now with multiple loose stools EXAM: CT ABDOMEN AND PELVIS WITH CONTRAST TECHNIQUE: Multidetector CT imaging of the abdomen and pelvis was performed using the standard protocol following bolus administration of intravenous contrast. RADIATION DOSE REDUCTION: This exam was performed according to the departmental dose-optimization program which includes automated exposure control, adjustment of the mA and/or kV according to patient size and/or use of iterative reconstruction technique. CONTRAST:  OMNIPAQUE IOHEXOL 300 MG/ML  SOLN COMPARISON:  02/08/2024, 02/04/2024 FINDINGS: Lower chest: Mild hypoventilatory changes at the lung bases. Hepatobiliary: There is persistent biliary duct dilation and pneumobilia, with indwelling common bile stent duct stents in stable position. There is reflux of oral contrast into the common bile duct. No evidence of choledocholithiasis. Prior cholecystectomy. Liver is otherwise unremarkable. Pancreas: Stable diffuse pancreatic parenchymal atrophy. Stable pancreatic ductal dilation measuring up to 11 mm. No acute  inflammatory changes. Spleen: Normal in size without focal abnormality. Adrenals/Urinary Tract: The adrenals are stable. Bilateral renal cortical thinning. Otherwise the kidneys enhance normally. No urinary tract calculi or obstruction. Bladder is unremarkable. Stomach/Bowel: There is mild diffuse dilation of the small bowel, with progression of oral contrast throughout the colon. Maximal diameter of the jejunum measures 4.3 cm, previously measuring up to 4.8 cm. Moderate retained stool within the distal colon. No bowel wall thickening or inflammatory change. Vascular/Lymphatic: Aortic atherosclerosis. No enlarged abdominal or pelvic lymph nodes. Reproductive: Status post hysterectomy. No adnexal masses. Other: No free fluid or free intraperitoneal gas. No abdominal wall hernia. Musculoskeletal: Stable spinal stimulator. No acute or destructive bony abnormalities. Unremarkable left arthroplasty. IMPRESSION: 1. Diffuse dilation of the small bowel, decreasing caliber since prior study. There is no transition point on this exam, and oral contrast has progressed into the colon excluding high-grade obstruction. Findings could reflect intermittent or resolving small bowel obstruction. Continued follow-up recommended. 2. Moderate  stool within the distal colon. 3. Continued intrahepatic and extrahepatic biliary duct dilation and pancreatic duct dilation, with stable position of indwelling bile duct stents. Stable pneumobilia consistent with stent patency. 4.  Aortic Atherosclerosis (ICD10-I70.0). Electronically Signed   By: Sharlet Salina M.D.   On: 02/10/2024 15:08   Family Communication: Discussed with daughter, she understands and agrees. All questions answereed.  Disposition: Status is: Inpatient Remains inpatient appropriate because: unable to tolerate diet, has abdominal distention.  Planned Discharge Destination: Home with Home Health     Time spent: 43 minutes  Author: Marcelino Duster,  MD 02/11/2024 3:19 PM Secure chat 7am to 7pm For on call review [email or web address].

## 2024-02-11 NOTE — Plan of Care (Signed)
  Problem: Nutrition: Goal: Adequate nutrition will be maintained 02/11/2024 2052 by Rubie Maid, RN Outcome: Progressing 02/11/2024 2052 by Rubie Maid, RN Outcome: Progressing   Problem: Pain Managment: Goal: General experience of comfort will improve and/or be controlled 02/11/2024 2052 by Rubie Maid, RN Outcome: Progressing 02/11/2024 2052 by Rubie Maid, RN Outcome: Progressing   Problem: Safety: Goal: Ability to remain free from injury will improve 02/11/2024 2052 by Rubie Maid, RN Outcome: Progressing 02/11/2024 2052 by Rubie Maid, RN Outcome: Progressing   Problem: Skin Integrity: Goal: Risk for impaired skin integrity will decrease 02/11/2024 2052 by Rubie Maid, RN Outcome: Progressing 02/11/2024 2052 by Rubie Maid, RN Outcome: Progressing

## 2024-02-11 NOTE — Consult Note (Cosign Needed)
 Consultation Note   Referring Provider:  [facility] PCP: Zola Button, Grayling Congress, DO Primary Gastroenterologist: Corliss Parish, MD     Reason for Consultation: Abdominal pain  DOA: 02/04/2024         Hospital Day: 8   ASSESSMENT    Brief Narrative:  89 y.o. 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  She is unsure if the right mid / RLQ pain is new or just worsening of her chronic pain. Also mentions that the pain may actually be in her back.  Source of pain is unclear but no indication of a biliary origin with normal LFTs good stent placement on CT scan. Additionally SBO seems to be resolving.    Loose stool?  Patient describes loose stool but doesn't appear to be having any more than 1-2 BMs a day ( over last couple of days) based on what is documented in I+0. Also, CT scan yesterday actually shows moderate stool in right colon .   New leukocytosis  WBC 19K today, up from 9.3 two days ago. Again, no acute findings on CT scan. Other   History of recurrent choledocholithiasis. Biliary stent in place As a result of her anatomy (ampullary diverticulum) and a main duct IPMN she has had recurrent biliary obstructions related to significant mucous plugging. She is s/p multiple ERCPs with sphincterotomy, stone extraction, lithotripsy,  and stenting. Not a surgical candidate due to age 12 recent ERCP completed in November at which time bile duct was only partially cleared but stent placed.    Chronic calcific pancreatitis Pancreatic insufficiency  Main duct IPMN      PLAN:   --Continue  Creon --am CBC --Continue analgesics. Source of pain unknown at this point. Will follow along.   --Patient is scheduled for repeat ERCP with lithotripsy on 02/18/22  HPI   Brief GI History:  Patient last seen by [place] for consult in ED on 01/13/24 for right sided abdominal pain. CT scan was negative for any acute findings. She was already scheduled to have an ERCP on 3/6 for recurrent choledocholithiasis  She was discharged home.  Interval History:  Patient admitted 2/19 for RUQ pain and nausea as well as yellow stools. Found to have SBO with transition point in RLQ.  General Surgery evaluated - no surgical intervention required and SBO improved with SBO protocol. Diet advanced on 2/22. Subsequently developed abdominal distention and increased pain but plain films suggested resolving SBP.  Surgery signed off 2/24.   Patient's continues to have intermittent abdominal pain pain. Her LFTs have been normal. Stable biliary findings on CT scan - biliary duct dilation and pneumobilia with indwelling common bile duct stents in stable position. No evidence of choledocholithiasis.    Previous GI Evaluations   Most recent ERCP 10/29/23 - Congested, texture changed mucosa in the esophagus at site of previous SCC - biopsied. - No other gross lesions in the entire esophagus. - Z-line irregular, 39 cm from the incisors. -  Gastritis and gastric ulcers - biopsied for HP evaluation. - No gross lesions in the duodenal bulb and in the sweep. - The major papilla was located entirely within a diverticulum. - The major papilla appeared patulous with a large mucous ball (4-5 cm in size) that took 10 minutes to clear. - The major pancreatic orifice appeared congested - biopsied to rule out dysplasia. - Prior biliary sphincterotomy appeared open. - Multiple filling defects consistent with stones and sludge was seen on the cholangiogram. - The entire biliary tree was severely dilated. - Choledocholithiasis was found. Partial  removal was accomplished with biliary balloon sphincteroplasty, balloon sweeping, mechanical lithotripsy; 2 biliary stents placed for further decompression.   Labs and imaging this admission  02/10/24 CT AP with contrast Diffuse dilation of the small bowel, decreasing caliber since prior study. There is no transition point on this exam, and oral contrast has progressed into the colon excluding high-grade obstruction. Findings could reflect intermittent or resolving small bowel obstruction. Continued follow-up recommended. 2. Moderate stool within the distal colon. 3. Continued intrahepatic and extrahepatic biliary duct dilation and pancreatic duct dilation, with stable position of indwelling bile duct stents. Stable pneumobilia consistent with stent patency. 4.  Aortic Atherosclerosis (ICD10-I70.0).  02/04/24 CT AP with contrast 1. Small-bowel obstruction with transition point in the right anatomic pelvis. 2. Liver appears mildly steatotic. 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 3. Chronic pancreatic ductal dilatation. Chronic calcific pancreatitis. 4.  Aortic atherosclerosis (ICD10-I70.0)  Recent Labs    02/09/24 0425 02/11/24 0442  WBC 9.3 19.0*  HGB 11.3* 11.7*  HCT 35.5* 35.3*  PLT 229 259   Recent Labs    02/09/24 0425 02/11/24 0442  NA 134* 130*  K 3.8 3.1*  CL 100 96*  CO2 25 24  GLUCOSE 127* 158*  BUN 10 12  CREATININE 0.56 0.32*  CALCIUM 8.7* 8.8*   Recent Labs    02/11/24 0442  PROT 5.3*  ALBUMIN 2.4*  AST 8*  ALT 15  ALKPHOS 62  BILITOT 0.5   No results for input(s): "LABPROT", "INR" in the last 72 hours.    Past Medical History:  Diagnosis Date   Arthritis    Choledocholithiasis    Diabetes mellitus without complication (HCC)    Diverticulitis    Esophageal cancer (HCC) dx'd 2020   GERD (gastroesophageal reflux disease)    Hyperlipidemia    Hypertension    OSA (obstructive sleep  apnea)    Urine incontinence    Uterine cancer (HCC)     Past Surgical History:  Procedure Laterality Date   ABDOMINAL HYSTERECTOMY  1972   APPENDECTOMY     BALLOON DILATION N/A 10/29/2023   Procedure: Billiary BALLOON DILATION;  Surgeon: Lemar Lofty., MD;  Location: [facility];  Service: Gastroenterology;  Laterality: N/A;   BILIARY DILATION  03/05/2021   Procedure: BILIARY DILATION;  Surgeon: Meridee Score Netty Starring., MD;  Location: [facility];  Service: Gastroenterology;;   BILIARY DILATION  11/27/2021   Procedure: BILIARY DILATION;  Surgeon: Rachael Fee, MD;  Location: [facility] ENDOSCOPY;  Service: Endoscopy;;   BILIARY DILATION  02/11/2022   Procedure: BILIARY DILATION;  Surgeon: Lemar Lofty., MD;  Location: [facility];  Service: Gastroenterology;;   BILIARY STENT PLACEMENT  11/27/2021   Procedure: BILIARY STENT PLACEMENT;  Surgeon: Rachael Fee, MD;  Location: [facility] ENDOSCOPY;  Service: Endoscopy;;   BILIARY STENT PLACEMENT N/A 10/29/2023   Procedure: BILIARY STENT PLACEMENT;  Surgeon: Meridee Score,  Netty Starring., MD;  Location: [facility]COPY;  Service: Gastroenterology;  Laterality: N/A;   BIOPSY  03/05/2021   Procedure: BIOPSY;  Surgeon: Lemar Lofty., MD;  Location: [facility];  Service: Gastroenterology;;   BIOPSY  11/27/2021   Procedure: BIOPSY;  Surgeon: Rachael Fee, MD;  Location: [facility] ENDOSCOPY;  Service: Endoscopy;;   BIOPSY  10/29/2023   Procedure: BIOPSY;  Surgeon: Lemar Lofty., MD;  Location: [facility];  Service: Gastroenterology;;   CHOLECYSTECTOMY     ENDOSCOPIC RETROGRADE CHOLANGIOPANCREATOGRAPHY (ERCP) WITH PROPOFOL N/A 03/05/2021   Procedure: ENDOSCOPIC RETROGRADE CHOLANGIOPANCREATOGRAPHY (ERCP) WITH PROPOFOL;  Surgeon: Lemar Lofty., MD;  Location: [facility];  Service: Gastroenterology;  Laterality: N/A;   ENDOSCOPIC RETROGRADE CHOLANGIOPANCREATOGRAPHY (ERCP) WITH PROPOFOL N/A 11/27/2021    Procedure: ENDOSCOPIC RETROGRADE CHOLANGIOPANCREATOGRAPHY (ERCP) WITH PROPOFOL;  Surgeon: Rachael Fee, MD;  Location: [facility] ENDOSCOPY;  Service: Endoscopy;  Laterality: N/A;  possible EGD prior   ENDOSCOPIC RETROGRADE CHOLANGIOPANCREATOGRAPHY (ERCP) WITH PROPOFOL N/A 02/11/2022   Procedure: ENDOSCOPIC RETROGRADE CHOLANGIOPANCREATOGRAPHY (ERCP) WITH PROPOFOL;  Surgeon: Meridee Score Netty Starring., MD;  Location: [facility];  Service: Gastroenterology;  Laterality: N/A;   ERCP N/A 10/29/2023   Procedure: ENDOSCOPIC RETROGRADE CHOLANGIOPANCREATOGRAPHY (ERCP);  Surgeon: Lemar Lofty., MD;  Location: [facility];  Service: Gastroenterology;  Laterality: N/A;   ESOPHAGOGASTRODUODENOSCOPY N/A 11/27/2021   Procedure: ESOPHAGOGASTRODUODENOSCOPY (EGD);  Surgeon: Rachael Fee, MD;  Location: [facility] ENDOSCOPY;  Service: Endoscopy;  Laterality: N/A;   ESOPHAGOGASTRODUODENOSCOPY N/A 10/29/2023   Procedure: ESOPHAGOGASTRODUODENOSCOPY (EGD);  Surgeon: Lemar Lofty., MD;  Location: [facility];  Service: Gastroenterology;  Laterality: N/A;   ESOPHAGOGASTRODUODENOSCOPY (EGD) WITH PROPOFOL N/A 03/05/2021   Procedure: ESOPHAGOGASTRODUODENOSCOPY (EGD) WITH PROPOFOL;  Surgeon: Meridee Score Netty Starring., MD;  Location: [facility];  Service: Gastroenterology;  Laterality: N/A;   EUS N/A 03/05/2021   Procedure: UPPER ENDOSCOPIC ULTRASOUND (EUS) RADIAL;  Surgeon: Meridee Score Netty Starring., MD;  Location: [facility];  Service: Gastroenterology;  Laterality: N/A;   LITHOTRIPSY  02/11/2022   Procedure: LITHOTRIPSY;  Surgeon: Meridee Score Netty Starring., MD;  Location: [facility]COPY;  Service: Gastroenterology;;   LITHOTRIPSY  10/29/2023   Procedure: LITHOTRIPSY;  Surgeon: Meridee Score Netty Starring., MD;  Location: [facility]COPY;  Service: Gastroenterology;;   LUMBAR LAMINECTOMY     REMOVAL OF STONES  03/05/2021   Procedure: REMOVAL OF STONES;  Surgeon: Lemar Lofty., MD;  Location: [facility];   Service: Gastroenterology;;   REMOVAL OF STONES  11/27/2021   Procedure: REMOVAL OF STONES;  Surgeon: Rachael Fee, MD;  Location: [facility] ENDOSCOPY;  Service: Endoscopy;;   REMOVAL OF STONES  02/11/2022   Procedure: REMOVAL OF STONES;  Surgeon: Lemar Lofty., MD;  Location: [facility]COPY;  Service: Gastroenterology;;   REMOVAL OF STONES  10/29/2023   Procedure: REMOVAL OF STONES;  Surgeon: Lemar Lofty., MD;  Location: [facility]OSCOPY;  Service: Gastroenterology;;   SPINAL CORD STIMULATOR IMPLANT     Not active any more.   SPYGLASS CHOLANGIOSCOPY N/A 03/05/2021   Procedure: SPYGLASS CHOLANGIOSCOPY;  Surgeon: Lemar Lofty., MD;  Location: [facility];  Service: Gastroenterology;  Laterality: N/A;   SPYGLASS CHOLANGIOSCOPY N/A 02/11/2022   Procedure: SPYGLASS CHOLANGIOSCOPY;  Surgeon: Lemar Lofty., MD;  Location: [facility];  Service: Gastroenterology;  Laterality: N/A;   SPYGLASS LITHOTRIPSY N/A 03/05/2021   Procedure: RUEAVWUJ LITHOTRIPSY;  Surgeon: Lemar Lofty., MD;  Location: [facility];  Service: Gastroenterology;  Laterality: N/A;   SPYGLASS LITHOTRIPSY N/A 02/11/2022   Procedure: [id number] LITHOTRIPSY;  Surgeon: Lemar Lofty.,  MD;  Location: [facility];  Service: Gastroenterology;  Laterality: N/A;   STENT REMOVAL  02/11/2022   Procedure: STENT REMOVAL;  Surgeon: Lemar Lofty., MD;  Location: [facility];  Service: Gastroenterology;;   TONSILLECTOMY     TOTAL KNEE ARTHROPLASTY Right     Family History  Problem Relation Age of Onset   Diabetes Mother    Heart disease Father    Lung cancer Sister    Hypertension Daughter    Diabetes Daughter    Hypertension Daughter    Lung cancer Son    Hypertension Son    Diabetes Son    Hypertension Son    Colon cancer Neg Hx    Esophageal cancer Neg Hx    Inflammatory bowel disease Neg Hx    Liver disease Neg Hx    Pancreatic cancer Neg Hx    Rectal cancer Neg Hx     Stomach cancer Neg Hx     Prior to Admission medications   Medication Sig Start Date End Date Taking? Authorizing Provider  bumetanide (BUMEX) 1 MG tablet Take 0.5-1 tablets (0.5-1 mg total) by mouth daily as needed (swelling). 10/31/23  Yes Rolly Salter, MD  CREON [phone] units CPEP capsule TAKE 1 CAPSULE THREE TIMES A DAY BEFORE MEALS Patient taking differently: Take 36,000 Units by mouth See admin instructions. Take 36,000 units by mouth before breakfast and supper/evening meal 05/05/23  Yes Lowne Florina Ou R, DO  docusate sodium (COLACE) 100 MG capsule Take 1 capsule (100 mg total) by mouth 2 (two) times daily. Patient taking differently: Take 100-200 mg by mouth See admin instructions. Take 200 mg by mouth in the morning and 100 mg in the evening 03/18/21  Yes Osvaldo Shipper, MD  famciclovir [facility]) 125 MG tablet TAKE 1 TABLET DAILY 03/31/23  Yes Zola Button, Grayling Congress, DO  JARDIANCE 25 MG TABS tablet TAKE 1 TABLET DAILY BEFORE BREAKFAST 01/30/24  Yes Seabron Spates R, DO  latanoprost (XALATAN) 0.005 % ophthalmic solution Place 1 drop into both eyes at bedtime. 08/24/21  Yes Donato Schultz, DO  LINZESS 145 MCG CAPS capsule TAKE 1 CAPSULE DAILY BEFORE BREAKFAST Patient taking differently: Take 145 mcg by mouth daily before breakfast. 01/08/24  Yes Zola Button, Yvonne R, DO  mirtazapine (REMERON) 15 MG tablet TAKE 1 TABLET AT BEDTIME 04/16/23  Yes Seabron Spates R, DO  morphine (MS CONTIN) 30 MG 12 hr tablet Take 30 mg by mouth every 12 (twelve) hours. 01/21/22  Yes [provider]  Multiple Vitamin (MULTIVITAMIN WITH MINERALS) TABS tablet Take 1 tablet by mouth daily with supper.   Yes [provider]  omeprazole (PRILOSEC) 40 MG capsule TAKE 1 CAPSULE IN THE MORNING AND AT BEDTIME Patient taking differently: Take 40 mg by mouth in the morning and at bedtime. 01/20/24  Yes Mansouraty, Netty Starring., MD  senna-docusate (SENNA S) 8.6-50 MG tablet Take 1  tablet by mouth See admin instructions. Take 1 tablet by mouth in the morning and evening   Yes [provider]  sucralfate (CARAFATE) 1 g tablet Take 1 tablet (1 g total) by mouth 2 (two) times daily. 01/28/24  Yes Mansouraty, Netty Starring., MD  TYLENOL 500 MG tablet Take 500-1,000 mg by mouth every 6 (six) hours as needed for mild pain (pain score 1-3) or headache.   Yes [provider]  valsartan (DIOVAN) 40 MG tablet Take 1 tablet (40 mg total) by mouth daily. 01/19/24  Yes Seabron Spates  R, DO  XARELTO 20 MG TABS tablet TAKE 1 TABLET DAILY WITH SUPPER, START ONLY AFTER THE STARTER PACK HAS BEEN COMPLETED Patient taking differently: Take 20 mg by mouth daily with supper. 10/23/23  Yes Seabron Spates R, DO  glucose blood (FREESTYLE LITE) test strip CHECK BLOOD SUGAR TWICE A DAY 11/20/22   Donato Schultz, DO    Current Facility-Administered Medications  Medication Dose Route Frequency Provider Last Rate Last Admin   acetaminophen (TYLENOL) tablet 650 mg  650 mg Oral Q6H PRN Marcelino Duster, MD       alum & mag hydroxide-simeth (MAALOX/MYLANTA) 200-200-20 MG/5ML suspension 30 mL  30 mL Oral Q6H PRN Karie Soda, MD   30 mL at 02/06/24 2201   amLODipine (NORVASC) tablet 5 mg  5 mg Oral Daily Tobey Grim, MD   5 mg at 02/11/24 6295   feeding supplement (BOOST / RESOURCE BREEZE) liquid 1 Container  1 Container Oral TID BM Tobey Grim, MD   1 Container at 02/11/24 1249   hydrALAZINE (APRESOLINE) injection 10 mg  10 mg Intravenous Q4H PRN Tobey Grim, MD   10 mg at 02/06/24 1223   HYDROmorphone (DILAUDID) injection 1 mg  1 mg Intravenous Q2H PRN Rometta Emery, MD   1 mg at 02/10/24 1940   iohexol (OMNIPAQUE) 300 MG/ML solution 30 mL  30 mL Oral Once PRN Tobey Grim, MD       irbesartan (AVAPRO) tablet 75 mg  75 mg Oral Daily Earlie Lou L, MD   75 mg at 02/11/24 0855   latanoprost (XALATAN) 0.005 % ophthalmic solution 1 drop  1 drop  Both Eyes QHS Rometta Emery, MD   1 drop at 02/10/24 2150   lipase/protease/amylase (CREON) capsule 36,000 Units  36,000 Units Oral BID AC Rometta Emery, MD   36,000 Units at 02/11/24 2841   magic mouthwash  15 mL Oral QID PRN Karie Soda, MD       menthol-cetylpyridinium (CEPACOL) lozenge 3 mg  1 lozenge Oral PRN Karie Soda, MD       mirtazapine (REMERON) tablet 15 mg  15 mg Oral QHS Earlie Lou L, MD   15 mg at 02/10/24 2149   morphine (MS CONTIN) 12 hr tablet 30 mg  30 mg Oral Q12H Garba, Mohammad L, MD   30 mg at 02/11/24 0855   ondansetron (ZOFRAN) tablet 4 mg  4 mg Oral Q6H PRN Rometta Emery, MD       Or   ondansetron (ZOFRAN) injection 4 mg  4 mg Intravenous Q6H PRN Rometta Emery, MD       Oral care mouth rinse  15 mL Mouth Rinse PRN Tobey Grim, MD   15 mL at 02/07/24 2100   oxyCODONE (Oxy IR/ROXICODONE) immediate release tablet 5 mg  5 mg Oral Q4H PRN Marcelino Duster, MD   5 mg at 02/11/24 1521   pantoprazole (PROTONIX) EC tablet 40 mg  40 mg Oral Daily Earlie Lou L, MD   40 mg at 02/11/24 0856   prochlorperazine (COMPAZINE) injection 5 mg  5 mg Intravenous Q6H PRN Anthoney Harada, NP   5 mg at 02/06/24 2048   rivaroxaban (XARELTO) tablet 20 mg  20 mg Oral Q supper Tobey Grim, MD   20 mg at 02/10/24 1800   simethicone (MYLICON) 40 MG/0.6ML suspension 80 mg  80 mg Oral QID PRN Karie Soda, MD       sucralfate (CARAFATE)  tablet 1 g  1 g Oral BID Earlie Lou L, MD   1 g at 02/11/24 1610    Allergies as of 02/04/2024 - Review Complete 02/04/2024  Allergen Reaction Noted   Gramicidin Other (See Comments) 01/17/2021   Sulfa antibiotics Rash 10/19/2020   Wound dressing adhesive Rash and Other (See Comments) 10/19/2020    Social History   Socioeconomic History   Marital status: Widowed    Spouse name: Not on file   Number of children: 4   Years of education: Not on file   Highest education level: Not on file  Occupational  History   Occupation: Retired Lawyer  Tobacco Use   Smoking status: Former    Current packs/day: 0.00    Average packs/day: 1 pack/day for 35.0 years (35.0 ttl pk-yrs)    Types: Cigarettes    Start date: 01/17/1954    Quit date: 01/17/1989    Years since quitting: 35.0   Smokeless tobacco: Never  Vaping Use   Vaping status: Never Used  Substance and Sexual Activity   Alcohol use: Not Currently   Drug use: Never   Sexual activity: Not Currently  Other Topics Concern   Not on file  Social History Narrative   Just moved from [place]--- and moved into an apt 2.4 miles from her daughter    Social Drivers of Health   Financial Resource Strain: Low Risk  (04/02/2022)   Overall Financial Resource Strain (CARDIA)    Difficulty of Paying Living Expenses: Not hard at all  Food Insecurity: No Food Insecurity (02/05/2024)   Hunger Vital Sign    Worried About Running Out of Food in the Last Year: Never true    Ran Out of Food in the Last Year: Never true  Transportation Needs: No Transportation Needs (02/05/2024)   PRAPARE - Administrator, Civil Service (Medical): No    Lack of Transportation (Non-Medical): No  Physical Activity: Sufficiently Active (04/02/2022)   Exercise Vital Sign    Days of Exercise per Week: 5 days    Minutes of Exercise per Session: 60 min  Stress: No Stress Concern Present (04/02/2022)   [employer] of Occupational Health - Occupational Stress Questionnaire    Feeling of Stress : Not at all  Social Connections: Moderately Isolated (02/04/2024)   Social Connection and Isolation Panel [NHANES]    Frequency of Communication with Friends and Family: More than three times a week    Frequency of Social Gatherings with Friends and Family: Three times a week    Attends Religious Services: More than 4 times per year    Active Member of Clubs or Organizations: No    Attends Banker Meetings: Never    Marital Status: Widowed  Intimate Partner Violence:  Not At Risk (02/05/2024)   Humiliation, Afraid, Rape, and Kick questionnaire    Fear of Current or Ex-Partner: No    Emotionally Abused: No    Physically Abused: No    Sexually Abused: No     Code Status   Code Status: Limited: Do not attempt resuscitation (DNR) -DNR-LIMITED -Do Not Intubate/DNI   Review of Systems: All systems reviewed and negative except where noted in HPI.  Physical Exam: Vital signs in last 24 hours: Temp:  [97.9 F (36.6 C)-98.5 F (36.9 C)] 97.9 F (36.6 C) (02/26 1205) Pulse Rate:  [66-70] 70 (02/26 1205) Resp:  [18] 18 (02/26 1205) BP: (150-178)/(51-56) 178/51 (02/26 1205) SpO2:  [93 %-96 %] 94 % (02/26  1205) Weight:  [60.1 kg] 60.1 kg (02/26 1300) Last BM Date : 02/11/24  General:  Pleasant female in NAD Psych:  Cooperative. Normal mood and affect Eyes: Pupils equal Ears:  Normal auditory acuity Nose: No deformity, discharge or lesions Neck:  Supple, no masses felt Lungs:  Clear to auscultation.  Heart:  Regular rate, regular rhythm.  Abdomen:  Soft, nondistended, moderate right mid and right lower abdominal tenderness. Active bowel sounds, no masses felt Rectal :  Deferred Msk: Symmetrical without gross deformities.  Neurologic:  Alert, oriented, grossly normal neurologically Extremities : No edema Skin:  Intact without significant lesions.    Intake/Output from previous day: 02/25 0701 - 02/26 0700 In: 740 [P.O.:740] Out: -  Intake/Output this shift:  No intake/output data recorded.   Willette Cluster, NP-C   02/11/2024, 3:43 PM  GI ATTENDING  History, laboratories, x-rays reviewed.  Agree with comprehensive consultation note as outlined above.  Reviewed in detail with the GI nurse practitioner.  Seen for right-sided pain.  Presented, in part, with SBO versus small bowel ileus.  Seen by surgery.  The problem seems to have resolved without obstructive process identified.  At this point, no obvious evidence for primary GI cause of right  sided/back pain.  Certainly not biliary.  Other workup to date has been negative.  Continue with supportive measures and observation.  Will follow.  Wilhemina Bonito. Eda Keys., M.D. [facility] Division of Gastroenterology

## 2024-02-11 NOTE — Plan of Care (Signed)

## 2024-02-11 NOTE — Progress Notes (Signed)
 Physical Therapy Treatment Patient Details Name: Sherry Holloway MRN: [id number] DOB: 1928-04-30 Today's Date: 02/11/2024   History of Present Illness Pt admitted from home with c/o abd pain 2* SBO.  Pt with hx of DM, back surgery with spinal cord stimulator implant, R TKR, esophageal CA, uterine CA, and chronic pain    PT Comments  Today pt is feeling "poorly".  Required more assist.  Last session amb 50 feet.  To only amb 18 feet.  Also present with loose, watery stools when assisted to bathroom. AxO x 3 very pleasant Lady with supportive Daughter at bedside General bed mobility comments: cues for sequence, use of bedrail and assist to manage LEs then Mod assist to complete scooting to EOB. General transfer comment: Steady assist with cues for use of UEs to self assist.  Started with a Rollator but pt too weak/unsteady so switched back to RW.  Also assisted with a toilet transfer including peri care as pt was unable to self perform and safely stand.  Weak/unsteady. General Gait Details: very limited amb distance to amb from bathroom only due to weakness/fatigue/effort.  Tried Rollator at first however too unsteady.  Reverted back to RW.  Only able to amb 9 feet to the bathroom then another 9 feet to recliner.  Max c/o fatigue. Daughter asked that pt be weighed.  Standing scale 60.1 Kg LPT has rec HH PT.  Pt lives home alone with "daughter down the street".  If Pt returns home, she will need 24/7 assist.      If plan is discharge home, recommend the following: A little help with walking and/or transfers;A little help with bathing/dressing/bathroom;Help with stairs or ramp for entrance;Assist for transportation;Assistance with cooking/housework   Can travel by private vehicle        Equipment Recommendations  None recommended by PT    Recommendations for Other Services       Precautions / Restrictions Precautions Precautions: Fall Restrictions Weight Bearing Restrictions Per Provider  Order: No     Mobility  Bed Mobility Overal bed mobility: Needs Assistance Bed Mobility: Supine to Sit     Supine to sit: Min assist, Mod assist     General bed mobility comments: cues for sequence, use of bedrail and assist to manage LEs then Mod assist to complete scooting to EOB.    Transfers Overall transfer level: Needs assistance Equipment used: Rolling walker (2 wheels) Transfers: Sit to/from Stand Sit to Stand: Min assist, Mod assist           General transfer comment: Steady assist with cues for use of UEs to self assist.  Started with a Rollator but pt too weak/unsteady so switched back to RW.  Also assisted with a toilet transfer including peri care as pt was unable to self perform and safely stand.  Weak/unsteady.    Ambulation/Gait Ambulation/Gait assistance: Min assist Gait Distance (Feet): 18 Feet (9 feet x 2 to and from BR) Assistive device: Rolling walker (2 wheels)   Gait velocity: decrease     General Gait Details: very limited amb distance to amb from bathroom only due to weakness/fatigue/effort.  Tried Rollator at first however too unsteady.  Reverted back to RW.  Only able to amb 9 feet to the bathroom then another 9 feet to recliner.  Max c/o fatigue.   Stairs             Wheelchair Mobility     Tilt Bed    Modified Rankin (Stroke Patients Only)  Balance                                            Communication Communication Communication: No apparent difficulties Factors Affecting Communication: Hearing impaired  Cognition Arousal: Alert Behavior During Therapy: WFL for tasks assessed/performed   PT - Cognitive impairments: No apparent impairments                       PT - Cognition Comments: Axo x 3 very pleasant Lady with supportive Daughter at bedside Following commands: Intact      Cueing Cueing Techniques: Verbal cues  Exercises      General Comments        Pertinent  Vitals/Pain Pain Assessment Pain Assessment: Faces Faces Pain Scale: Hurts little more Pain Location: bacvk pain and R ABD UQ "uner breat" Pain Descriptors / Indicators: Aching, Grimacing, Guarding Pain Intervention(s): Monitored during session    Home Living                          Prior Function            PT Goals (current goals can now be found in the care plan section) Progress towards PT goals: Progressing toward goals    Frequency    Min 1X/week      PT Plan      Co-evaluation              AM-PAC PT "6 Clicks" Mobility   Outcome Measure  Help needed turning from your back to your side while in a flat bed without using bedrails?: A Lot Help needed moving from lying on your back to sitting on the side of a flat bed without using bedrails?: A Lot Help needed moving to and from a bed to a chair (including a wheelchair)?: A Lot Help needed standing up from a chair using your arms (e.g., wheelchair or bedside chair)?: A Lot Help needed to walk in hospital room?: A Lot Help needed climbing 3-5 steps with a railing? : Total 6 Click Score: 11    End of Session Equipment Utilized During Treatment: Gait belt Activity Tolerance: Patient tolerated treatment well Patient left: in chair;with call bell/phone within reach;with family/visitor present Nurse Communication: Mobility status PT Visit Diagnosis: Muscle weakness (generalized) (M62.81);History of falling (Z91.81);Difficulty in walking, not elsewhere classified (R26.2)     Time: 1325-1350 PT Time Calculation (min) (ACUTE ONLY): 25 min  Charges:    $Gait Training: 8-22 mins $Therapeutic Activity: 8-22 mins PT General Charges $$ ACUTE PT VISIT: 1 Visit                     Felecia Shelling  PTA Acute  Rehabilitation Services Office M-F          [phone]

## 2024-02-12 ENCOUNTER — Inpatient Hospital Stay (HOSPITAL_COMMUNITY): Payer: Medicare Other

## 2024-02-12 DIAGNOSIS — D72829 Elevated white blood cell count, unspecified: Secondary | ICD-10-CM

## 2024-02-12 DIAGNOSIS — E871 Hypo-osmolality and hyponatremia: Secondary | ICD-10-CM | POA: Diagnosis not present

## 2024-02-12 DIAGNOSIS — C154 Malignant neoplasm of middle third of esophagus: Secondary | ICD-10-CM | POA: Diagnosis not present

## 2024-02-12 DIAGNOSIS — R1031 Right lower quadrant pain: Secondary | ICD-10-CM | POA: Diagnosis not present

## 2024-02-12 DIAGNOSIS — K56609 Unspecified intestinal obstruction, unspecified as to partial versus complete obstruction: Secondary | ICD-10-CM | POA: Diagnosis not present

## 2024-02-12 DIAGNOSIS — K567 Ileus, unspecified: Secondary | ICD-10-CM

## 2024-02-12 DIAGNOSIS — K2100 Gastro-esophageal reflux disease with esophagitis, without bleeding: Secondary | ICD-10-CM | POA: Diagnosis not present

## 2024-02-12 DIAGNOSIS — G8929 Other chronic pain: Secondary | ICD-10-CM

## 2024-02-12 DIAGNOSIS — G894 Chronic pain syndrome: Secondary | ICD-10-CM | POA: Diagnosis not present

## 2024-02-12 LAB — COMPREHENSIVE METABOLIC PANEL
ALT: 16 U/L (ref 0–44)
AST: 9 U/L — ABNORMAL LOW (ref 15–41)
Albumin: 2.5 g/dL — ABNORMAL LOW (ref 3.5–5.0)
Alkaline Phosphatase: 74 U/L (ref 38–126)
Anion gap: 10 (ref 5–15)
BUN: 12 mg/dL (ref 8–23)
CO2: 22 mmol/L (ref 22–32)
Calcium: 9.3 mg/dL (ref 8.9–10.3)
Chloride: 98 mmol/L (ref 98–111)
Creatinine, Ser: 0.54 mg/dL (ref 0.44–1.00)
GFR, Estimated: >60 mL/min (ref >60)
Glucose, Bld: 173 mg/dL — ABNORMAL HIGH (ref 70–99)
Potassium: 3.3 mmol/L — ABNORMAL LOW (ref 3.5–5.1)
Sodium: 130 mmol/L — ABNORMAL LOW (ref 135–145)
Total Bilirubin: 0.5 mg/dL (ref 0.0–1.2)
Total Protein: 5.6 g/dL — ABNORMAL LOW (ref 6.5–8.1)

## 2024-02-12 LAB — CBC
HCT: 39.3 % (ref 36.0–46.0)
Hemoglobin: 12.7 g/dL (ref 12.0–15.0)
MCH: 27.5 pg (ref 26.0–34.0)
MCHC: 32.3 g/dL (ref 30.0–36.0)
MCV: 85.2 fL (ref 80.0–100.0)
Platelets: 278 10*3/uL (ref 150–400)
RBC: 4.61 MIL/uL (ref 3.87–5.11)
RDW: 14.2 % (ref 11.5–15.5)
WBC: 20.1 10*3/uL — ABNORMAL HIGH (ref 4.0–10.5)
nRBC: 0 % (ref 0.0–0.2)

## 2024-02-12 LAB — MAGNESIUM: Magnesium: 2 mg/dL (ref 1.7–2.4)

## 2024-02-12 LAB — PHOSPHORUS: Phosphorus: 2.9 mg/dL (ref 2.5–4.6)

## 2024-02-12 MED ORDER — POTASSIUM CHLORIDE IN NACL 40-0.9 MEQ/L-% IV SOLN
INTRAVENOUS | Status: AC
  Filled 2024-02-12 (×2): qty 1000

## 2024-02-12 MED ORDER — HYDROMORPHONE HCL 1 MG/ML IJ SOLN
1.0000 mg | INTRAMUSCULAR | Status: DC | PRN
  Administered 2024-02-12 – 2024-02-13 (×2): 1 mg via INTRAVENOUS
  Filled 2024-02-12 (×2): qty 1

## 2024-02-12 MED ORDER — DIATRIZOATE MEGLUMINE & SODIUM 66-10 % PO SOLN
90.0000 mL | Freq: Once | ORAL | Status: AC
  Administered 2024-02-12: 90 mL via ORAL
  Filled 2024-02-12: qty 90

## 2024-02-12 MED ORDER — POTASSIUM CHLORIDE 20 MEQ PO PACK
20.0000 meq | PACK | Freq: Two times a day (BID) | ORAL | Status: DC
  Administered 2024-02-12: 20 meq via ORAL
  Filled 2024-02-12: qty 1

## 2024-02-12 MED ORDER — MORPHINE SULFATE (PF) 2 MG/ML IV SOLN
2.0000 mg | INTRAVENOUS | Status: DC | PRN
  Administered 2024-02-13 (×2): 2 mg via INTRAVENOUS
  Filled 2024-02-12 (×2): qty 1

## 2024-02-12 MED ORDER — SODIUM CHLORIDE 0.9 % IV SOLN: INTRAVENOUS | Status: DC

## 2024-02-12 NOTE — Plan of Care (Signed)

## 2024-02-12 NOTE — Progress Notes (Signed)
 Progress Note     Subjective: Asked to re-evaluate patient today secondary to abdominal distention and nausea and vomiting overnight. She is not currently nauseated. She is still passing flatus and having liquid BMs. She denies dysuria or respiratory symptoms. She is having right sided abdominal pain that radiates to her back.   Objective: Vital signs in last 24 hours: Temp:  [98.2 F (36.8 C)-98.5 F (36.9 C)] 98.5 F (36.9 C) (02/27 0407) Pulse Rate:  [69-71] 69 (02/27 0407) Resp:  [16-20] 16 (02/27 0407) BP: (159-181)/(58-71) 159/58 (02/27 0407) SpO2:  [96 %-97 %] 96 % (02/27 0407) Weight:  [60.1 kg] 60.1 kg (02/26 1300) Last BM Date : 02/11/24  Intake/Output from previous day: 02/26 0701 - 02/27 0700 In: 240 [P.O.:240] Out: -  Intake/Output this shift: No intake/output data recorded.  PE: Gen:  Alert, pleasant, appears uncomfortable  Pulm:  Normal effort ORA Abd: Soft, generalized ttp without peritonitis, distended  Skin: warm and dry, no rashes  Psych: A&Ox3    Lab Results:  Recent Labs    02/11/24 0442 02/12/24 0434  WBC 19.0* 20.1*  HGB 11.7* 12.7  HCT 35.3* 39.3  PLT 259 278   BMET Recent Labs    02/11/24 0442 02/12/24 0434  NA 130* 130*  K 3.1* 3.3*  CL 96* 98  CO2 24 22  GLUCOSE 158* 173*  BUN 12 12  CREATININE 0.32* 0.54  CALCIUM 8.8* 9.3   PT/INR No results for input(s): "LABPROT", "INR" in the last 72 hours. CMP     Component Value Date/Time   NA 130 (L) 02/12/2024 0434   K 3.3 (L) 02/12/2024 0434   CL 98 02/12/2024 0434   CO2 22 02/12/2024 0434   GLUCOSE 173 (H) 02/12/2024 0434   BUN 12 02/12/2024 0434   CREATININE 0.54 02/12/2024 0434   CREATININE 0.94 11/07/2023 1422   CALCIUM 9.3 02/12/2024 0434   PROT 5.6 (L) 02/12/2024 0434   ALBUMIN 2.5 (L) 02/12/2024 0434   AST 9 (L) 02/12/2024 0434   AST 12 (L) 04/02/2023 1359   ALT 16 02/12/2024 0434   ALT 20 04/02/2023 1359   ALKPHOS 74 02/12/2024 0434   BILITOT 0.5 02/12/2024  0434   BILITOT 0.3 04/02/2023 1359   GFRNONAA >60 02/12/2024 0434   GFRNONAA 53 (L) 04/02/2023 1359   Lipase     Component Value Date/Time   LIPASE 19 02/04/2024 1506       Studies/Results: No results found.   Anti-infectives: Anti-infectives (From admission, onward)    None        Assessment/Plan  SBO vs ileus in setting of Chronic constipation  -  CT scan of the abdomen and pelvis showed SBO with transition point in RLQ. No pneumoperitoneum or signs of bowel ischemia on CT. Visible biliary stents noted in place from her history of choledocholithiasis 10/2023. Gallbladder surgically absent.  - SBO protocol 2/21 - contrast passed into colon - patient having liquid stools but distention and n/v also, significant constipation at baseline - KUB today with stool burden in ascending and descending colon  - recommend enema from below and giving PO gastrografin, will get 8h delay after gastrografin - NPO except ice chips and sips  - mobility as tolerated  - Keep K>4.0 and Mg >2.0 - GI following - if able to pass contrast then would recommend GI take over management of chronic bowel dysmotility and can follow up outpatient for that as well    FEN: NPO, ice chips, IVF  with 40 KCl @75  cc/h VTE: xarelto resumed 2/21 ID: no current abx  - per TRH -  HTN HLD OSA Hx if uterine cancer Hx of esophageal cancer GERD T2DM  LOS: 8 days   I reviewed Consultant GI notes, hospitalist notes, last 24 h vitals and pain scores, last 48 h intake and output, last 24 h labs and trends, and last 24 h imaging results.  This care required moderate level of medical decision making.    Juliet Rude, [facility] 02/12/2024, 12:38 PM Please see Amion for pager number during day hours 7:00am-4:30pm

## 2024-02-12 NOTE — Progress Notes (Signed)
   02/12/24 1549  PT Visit Information  Last PT Received On 02/11/24  Reason Eval/Treat Not Completed Patient not medically ready;Fatigue/lethargy limiting ability to participate  History of Present Illness Pt admitted from home with c/o abd pain 2* SBO.  Pt with hx of DM, back surgery with spinal cord stimulator implant, R TKR, esophageal CA, uterine CA, and chronic pain   Madaline Guthrie, PT Acute Rehabilitation Services Office: [phone] 02/12/2024

## 2024-02-12 NOTE — Progress Notes (Signed)
 Progress Note   Patient: Sherry Holloway WGN:[id number] DOB: 10/30/1928 DOA: 02/04/2024     8 DOS: the patient was seen and examined on 02/12/2024   Brief hospital course: Sherry Holloway is a 89 y.o. female with a medical history significant of essential hypertension, hyperlipidemia, GERD, esophageal cancer, diabetes, obstructive sleep apnea, biliary stones status post biliary stent placement back in November last year that presented to the ER with some abdominal pain.  Abdominal pain has been on and off since mid January.  Patient was recently seen in the January where workup was done and discharged home because findings with stable.  Pain has continued to persist.  Patient had about 15 pounds unintentional weight loss severe abdominal pain and right upper quadrant pain.  CT in ER suggested SBO.  Admitted to hospitalist service.  Surgery consulted.  Good bowel movements overnight and NG tube removed hospital day #2. She has continued distention with oral intake. Daughter at bedside worried asks for GI input. GI advised to continue supportive care.  02/12/24 WBC worsened, she has nausea and vomiting, I requested gen. Surgery follow up.  Assessment and Plan: Abdominal pain/ distension: Diffuse bowel dilation on CT abdomen. SBO resolved Worsening Leukocytosis. She does have loose stools, able to tolerate liquids but minimal due to distention, discomfort. C.diff studies negative. Surgery team signed off advised conservative management. CT abdomen shows ileus findings. GI team evaluated advised supportive care.  Today she is more nauseous, vomited x 2. Abdominal distension worsened. WBC worsened to 20, I asked surgery team to follow her. KUB ordered. May need to place NG for decompression. Clear liquids for now. Started gentle IV fluids. Daughter states current oral pain regimen not working. IV morphine, dilaudid PRN for pain ordered. Continue bowel regimen, reglan,  simethicone.  Hypokalemia Oral replacement daily ordered. Continue to monitor electrolytes.   Hypertension Continue Norvasc, home dose ARB.   Hyponatremia:  Hypotonic. Na 130 Ordered IV fluids NS at 87ml/hr. Trend Na.   Right subclavian stenosis Longstanding issue for patient per daughter More tolerant of higher BPs b/c of this, also per daughter, based on outpt physician recommendations.  Daughter also reports that outpatient doctors have been keeping her blood pressure higher than usual because of the stenosis. Per prior hospitalist- reviewed outpt notes, I don't see anything about tolerance of high BPs documented by cardiology/ PCP.  Will control her blood pressure as we have been here in the hospital, added Norvasc several days ago to help with blood pressure control.   Chronic calcific pancreatitis:  Chronic biliary obstruction recurrent choledocholithiasis status post multiple ERCPs  Patient is scheduled for repeat ERCP with lithotripsy on 02/18/22  Continue Creon. Continue current pain meds. GI evaluation appreciated.    GERD Continue PPI, simethicone.     Hyperlipidemia Resume statin at discharge.   Diabetes mellitus type 2 Continue sliding scale insulin while in the hospital. Blood glucose has been stable.   Chronic pain under Continue chronic pain management.   History of PE, on Xarelto outpatient Will hold Xarelto pending KUB.       Out of bed to chair. Incentive spirometry. Nursing supportive care. Fall, aspiration precautions. DVT prophylaxis   Code Status: Limited: Do not attempt resuscitation (DNR) -DNR-LIMITED -Do Not Intubate/DNI   Subjective: Patient is seen and examined today morning. She has nausea, vomiting, abdominal distension, discomfort. Has liquid stools. No family at bedside.  Physical Exam: Vitals:   02/11/24 1300 02/11/24 2104 02/11/24 2111 02/12/24 0407  BP:  Marland Kitchen)  181/71 (!) 180/63 (!) 159/58  Pulse:  70 71 69  Resp:  20 18 16    Temp:  98.2 F (36.8 C)  98.5 F (36.9 C)  TempSrc:  Oral  Oral  SpO2:  96% 97% 96%  Weight: 60.1 kg     Height:        General - Elderly ill Caucasian female, no apparent distress HEENT - PERRLA, EOMI, atraumatic head, non tender sinuses. Lung - Clear, basal rales, rhonchi, wheezes. Heart - S1, S2 heard, no murmurs, rubs, trace pedal edema. Abdomen - Soft, right upper and lower quadrant tender, distended, bowel sounds sluggish Neuro - Alert, awake and oriented x 3, non focal exam. Skin - Warm and dry.  Data Reviewed:      Latest Ref Rng & Units 02/12/2024    4:34 AM 02/11/2024    4:42 AM 02/09/2024    4:25 AM  CBC  WBC 4.0 - 10.5 K/uL 20.1  19.0  9.3   Hemoglobin 12.0 - 15.0 g/dL 57.8  46.9  62.9   Hematocrit 36.0 - 46.0 % 39.3  35.3  35.5   Platelets 150 - 400 K/uL 278  259  229       Latest Ref Rng & Units 02/12/2024    4:34 AM 02/11/2024    4:42 AM 02/09/2024    4:25 AM  BMP  Glucose 70 - 99 mg/dL 528  413  244   BUN 8 - 23 mg/dL 12  12  10    Creatinine 0.44 - 1.00 mg/dL 0.10  2.72  5.36   Sodium 135 - 145 mmol/L 130  130  134   Potassium 3.5 - 5.1 mmol/L 3.3  3.1  3.8   Chloride 98 - 111 mmol/L 98  96  100   CO2 22 - 32 mmol/L 22  24  25    Calcium 8.9 - 10.3 mg/dL 9.3  8.8  8.7    CT ABDOMEN PELVIS W CONTRAST Result Date: 02/10/2024 CLINICAL DATA:  Abdominal pain, resolved small-bowel obstruction now with multiple loose stools EXAM: CT ABDOMEN AND PELVIS WITH CONTRAST TECHNIQUE: Multidetector CT imaging of the abdomen and pelvis was performed using the standard protocol following bolus administration of intravenous contrast. RADIATION DOSE REDUCTION: This exam was performed according to the departmental dose-optimization program which includes automated exposure control, adjustment of the mA and/or kV according to patient size and/or use of iterative reconstruction technique. CONTRAST:  OMNIPAQUE IOHEXOL 300 MG/ML  SOLN COMPARISON:  02/08/2024, 02/04/2024  FINDINGS: Lower chest: Mild hypoventilatory changes at the lung bases. Hepatobiliary: There is persistent biliary duct dilation and pneumobilia, with indwelling common bile stent duct stents in stable position. There is reflux of oral contrast into the common bile duct. No evidence of choledocholithiasis. Prior cholecystectomy. Liver is otherwise unremarkable. Pancreas: Stable diffuse pancreatic parenchymal atrophy. Stable pancreatic ductal dilation measuring up to 11 mm. No acute inflammatory changes. Spleen: Normal in size without focal abnormality. Adrenals/Urinary Tract: The adrenals are stable. Bilateral renal cortical thinning. Otherwise the kidneys enhance normally. No urinary tract calculi or obstruction. Bladder is unremarkable. Stomach/Bowel: There is mild diffuse dilation of the small bowel, with progression of oral contrast throughout the colon. Maximal diameter of the jejunum measures 4.3 cm, previously measuring up to 4.8 cm. Moderate retained stool within the distal colon. No bowel wall thickening or inflammatory change. Vascular/Lymphatic: Aortic atherosclerosis. No enlarged abdominal or pelvic lymph nodes. Reproductive: Status post hysterectomy. No adnexal masses. Other: No free fluid or free intraperitoneal gas.  No abdominal wall hernia. Musculoskeletal: Stable spinal stimulator. No acute or destructive bony abnormalities. Unremarkable left arthroplasty. IMPRESSION: 1. Diffuse dilation of the small bowel, decreasing caliber since prior study. There is no transition point on this exam, and oral contrast has progressed into the colon excluding high-grade obstruction. Findings could reflect intermittent or resolving small bowel obstruction. Continued follow-up recommended. 2. Moderate stool within the distal colon. 3. Continued intrahepatic and extrahepatic biliary duct dilation and pancreatic duct dilation, with stable position of indwelling bile duct stents. Stable pneumobilia consistent with stent  patency. 4.  Aortic Atherosclerosis (ICD10-I70.0). Electronically Signed   By: Sharlet Salina M.D.   On: 02/10/2024 15:08   Family Communication: Discussed with daughter over phone, she understands and agrees. All questions answereed.  Disposition: Status is: Inpatient Remains inpatient appropriate because: unable to tolerate diet, has abdominal distention.  Planned Discharge Destination: Home with Home Health     MDM level 3- Patient is not improving clinically, WBC high today with nausea, vomiting and abd distended. Need close monitoring of hemodynamics, telemetry, neurologic, electrolytes. She is at high risk for clinical deterioration.   Author: Marcelino Duster, MD 02/12/2024 11:11 AM Secure chat 7am to 7pm For on call review [email or web address].

## 2024-02-12 NOTE — Plan of Care (Signed)

## 2024-02-12 NOTE — Progress Notes (Addendum)
 Daily Progress Note  DOA: 02/04/2024 Hospital Day: 9   Chief Complaint: Right sided abdominal pain   ASSESSMENT    Brief Narrative:  Sherry SURRETTE is a 89 y.o. 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  TODAY: Clinically seems to be improving. Abdominal exam not concerning for obstruction. Having BMs. I spoke with RN, patient did have an episode of vomiting this am it was after getting dilaudid. Had KUB earlier today, results are pending.  Patient complaining of loose stools all night but this is not documented in I+0 and RN wasn't aware of diarrhea during the night. She hasn't have any BMs yet today. Seems she may be having some confusion?  Appears narcotics have now been stopped.   Acute on chronic right sided abdominal pain .  Source of pain is unclear but no indication of a biliary origin with normal LFTs good stent placement on CT scan.    New leukocytosis  Unclear cause, reactive? Marland Kitchen No acute findings on CT AP with conrast 2/25 TODAY: Afebrile. WBC  9 >> 19 >> 20  History of recurrent choledocholithiasis. Biliary stent in place As a result of her anatomy (ampullary diverticulum) and a main duct IPMN she has had recurrent biliary obstructions related to significant mucous plugging. She is s/p multiple ERCPs with sphincterotomy, stone extraction, lithotripsy,  and stenting. Not a surgical candidate due to age 51 recent ERCP completed in November at which time bile duct was only partially cleared but stent placed.   Hyponatremia Na 130   Chronic calcific  pancreatitis Pancreatic insufficiency  Main duct IPMN    Principal Problem:   SBO (small bowel obstruction) (HCC) Active Problems:   Pancreatic mass   Gastroesophageal reflux disease with esophagitis without hemorrhage   Hypertension   Uncontrolled type 2 diabetes mellitus with hyperglycemia, without long-term current use of insulin (HCC)   Hyperlipidemia   OSA (obstructive sleep apnea)   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)   Type 2 diabetes mellitus with diabetic polyneuropathy, without long-term current use of insulin (HCC)   Chronic pain disorder   Intractable nausea and vomiting   PLAN   --Continue Creon --Discontinue narcotic analgesics.  --Await KUB results --Patient is scheduled for repeat ERCP with lithotripsy on 02/18/22   Subjective   Says she was up all night have loose stools. Didn't offer that she was having any abdominal pain but endorses it when asked. Per RN she had episode of vomiting this am.    Objective    Recent Labs    02/11/24 0442 02/12/24 0434  WBC 19.0* 20.1*  HGB 11.7* 12.7  HCT 35.3* 39.3  PLT 259 278   BMET Recent Labs    02/11/24 0442 02/12/24 0434  NA 130* 130*  K 3.1* 3.3*  CL 96* 98  CO2 24 22  GLUCOSE 158* 173*  BUN 12 12  CREATININE 0.32* 0.54  CALCIUM 8.8* 9.3   LFT Recent Labs    02/12/24 0434  PROT 5.6*  ALBUMIN 2.5*  AST 9*  ALT 16  ALKPHOS 74  BILITOT 0.5   PT/INR No results for input(s): "LABPROT", "INR" in  the last 72 hours.   Imaging:  CT ABDOMEN PELVIS W CONTRAST CLINICAL DATA:  Abdominal pain, resolved small-bowel obstruction now with multiple loose stools  EXAM: CT ABDOMEN AND PELVIS WITH CONTRAST  TECHNIQUE: Multidetector CT imaging of the abdomen and pelvis was performed using the standard protocol following bolus administration of intravenous contrast.  RADIATION DOSE REDUCTION: This exam was performed according to the departmental dose-optimization program which includes  automated exposure control, adjustment of the mA and/or kV according to patient size and/or use of iterative reconstruction technique.  CONTRAST:  OMNIPAQUE IOHEXOL 300 MG/ML  SOLN  COMPARISON:  02/08/2024, 02/04/2024  FINDINGS: Lower chest: Mild hypoventilatory changes at the lung bases.  Hepatobiliary: There is persistent biliary duct dilation and pneumobilia, with indwelling common bile stent duct stents in stable position. There is reflux of oral contrast into the common bile duct. No evidence of choledocholithiasis. Prior cholecystectomy. Liver is otherwise unremarkable.  Pancreas: Stable diffuse pancreatic parenchymal atrophy. Stable pancreatic ductal dilation measuring up to 11 mm. No acute inflammatory changes.  Spleen: Normal in size without focal abnormality.  Adrenals/Urinary Tract: The adrenals are stable. Bilateral renal cortical thinning. Otherwise the kidneys enhance normally. No urinary tract calculi or obstruction. Bladder is unremarkable.  Stomach/Bowel: There is mild diffuse dilation of the small bowel, with progression of oral contrast throughout the colon. Maximal diameter of the jejunum measures 4.3 cm, previously measuring up to 4.8 cm. Moderate retained stool within the distal colon. No bowel wall thickening or inflammatory change.  Vascular/Lymphatic: Aortic atherosclerosis. No enlarged abdominal or pelvic lymph nodes.  Reproductive: Status post hysterectomy. No adnexal masses.  Other: No free fluid or free intraperitoneal gas. No abdominal wall hernia.  Musculoskeletal: Stable spinal stimulator. No acute or destructive bony abnormalities. Unremarkable left arthroplasty.  IMPRESSION: 1. Diffuse dilation of the small bowel, decreasing caliber since prior study. There is no transition point on this exam, and oral contrast has progressed into the colon excluding high-grade obstruction. Findings could reflect intermittent or resolving  small bowel obstruction. Continued follow-up recommended. 2. Moderate stool within the distal colon. 3. Continued intrahepatic and extrahepatic biliary duct dilation and pancreatic duct dilation, with stable position of indwelling bile duct stents. Stable pneumobilia consistent with stent patency. 4.  Aortic Atherosclerosis (ICD10-I70.0).  Electronically Signed   By: Sharlet Salina M.D.   On: 02/10/2024 15:08     Scheduled inpatient medications:   amLODipine  5 mg Oral Daily   feeding supplement  1 Container Oral TID BM   irbesartan  75 mg Oral Daily   latanoprost  1 drop Both Eyes QHS   lipase/protease/amylase  36,000 Units Oral BID AC   mirtazapine  15 mg Oral QHS   morphine  30 mg Oral Q12H   pantoprazole  40 mg Oral Daily   potassium chloride  20 mEq Oral BID   sucralfate  1 g Oral BID   Continuous inpatient infusions:   sodium chloride     PRN inpatient medications: acetaminophen, alum & mag hydroxide-simeth, hydrALAZINE, HYDROmorphone (DILAUDID) injection, iohexol, magic mouthwash, menthol-cetylpyridinium, ondansetron **OR** ondansetron (ZOFRAN) IV, mouth rinse, oxyCODONE, simethicone  Vital signs in last 24 hours: Temp:  [97.9 F (36.6 C)-98.5 F (36.9 C)] 98.5 F (36.9 C) (02/27 0407) Pulse Rate:  [69-71] 69 (02/27 0407) Resp:  [16-20] 16 (02/27 0407) BP: (159-181)/(51-71) 159/58 (02/27 0407) SpO2:  [94 %-97 %] 96 % (02/27 0407) Weight:  [60.1 kg] 60.1 kg (02/26 1300) Last BM Date : 02/11/24  Intake/Output Summary (Last  24 hours) at 02/12/2024 1134 Last data filed at 02/11/2024 2147 Gross per 24 hour  Intake 240 ml  Output --  Net 240 ml    Intake/Output from previous day: 02/26 0701 - 02/27 0700 In: 240 [P.O.:240] Out: -  Intake/Output this shift: No intake/output data recorded.   Physical Exam:  General: Sleepy female in NAD Heart:  Regular rate and rhythm.  Pulmonary: Normal respiratory effort Abdomen: Soft, mildly distended with active bowel  sounds. Mild diffuse right sided tenderness.  Neurologic: Alert and oriented Psych: Pleasant. Cooperative. I.      LOS: 8 days   Willette Cluster ,NP 02/12/2024, 11:34 AM  GI ATTENDING  Interval history, events, and data reviewed.  Patient seen and examined.  Agree with interval progress note as outlined.  Benign abdomen with slight distention.  Await abdominal films.  Agree with recommended measures as above.  Wilhemina Bonito. Eda Keys., M.D. [facility] Division of Gastroenterology

## 2024-02-13 ENCOUNTER — Encounter: Payer: Self-pay | Admitting: Gastroenterology

## 2024-02-13 ENCOUNTER — Inpatient Hospital Stay (HOSPITAL_COMMUNITY): Payer: Medicare Other

## 2024-02-13 DIAGNOSIS — K56609 Unspecified intestinal obstruction, unspecified as to partial versus complete obstruction: Secondary | ICD-10-CM | POA: Diagnosis not present

## 2024-02-13 DIAGNOSIS — K8689 Other specified diseases of pancreas: Secondary | ICD-10-CM

## 2024-02-13 DIAGNOSIS — E1142 Type 2 diabetes mellitus with diabetic polyneuropathy: Secondary | ICD-10-CM | POA: Diagnosis not present

## 2024-02-13 DIAGNOSIS — R1031 Right lower quadrant pain: Secondary | ICD-10-CM

## 2024-02-13 DIAGNOSIS — K567 Ileus, unspecified: Secondary | ICD-10-CM

## 2024-02-13 DIAGNOSIS — R112 Nausea with vomiting, unspecified: Secondary | ICD-10-CM

## 2024-02-13 DIAGNOSIS — D72829 Elevated white blood cell count, unspecified: Secondary | ICD-10-CM | POA: Diagnosis not present

## 2024-02-13 DIAGNOSIS — E1165 Type 2 diabetes mellitus with hyperglycemia: Secondary | ICD-10-CM | POA: Diagnosis not present

## 2024-02-13 LAB — COMPREHENSIVE METABOLIC PANEL
ALT: 16 U/L (ref 0–44)
AST: 8 U/L — ABNORMAL LOW (ref 15–41)
Albumin: 2.5 g/dL — ABNORMAL LOW (ref 3.5–5.0)
Alkaline Phosphatase: 86 U/L (ref 38–126)
Anion gap: 10 (ref 5–15)
BUN: 23 mg/dL (ref 8–23)
CO2: 20 mmol/L — ABNORMAL LOW (ref 22–32)
Calcium: 9.5 mg/dL (ref 8.9–10.3)
Chloride: 100 mmol/L (ref 98–111)
Creatinine, Ser: 0.53 mg/dL (ref 0.44–1.00)
GFR, Estimated: >60 mL/min (ref >60)
Glucose, Bld: 191 mg/dL — ABNORMAL HIGH (ref 70–99)
Potassium: 3.9 mmol/L (ref 3.5–5.1)
Sodium: 130 mmol/L — ABNORMAL LOW (ref 135–145)
Total Bilirubin: 0.6 mg/dL (ref 0.0–1.2)
Total Protein: 5.9 g/dL — ABNORMAL LOW (ref 6.5–8.1)

## 2024-02-13 LAB — GLUCOSE, CAPILLARY
Glucose-Capillary: 178 mg/dL — ABNORMAL HIGH (ref 70–99)
Glucose-Capillary: 158 mg/dL — ABNORMAL HIGH (ref 70–99)

## 2024-02-13 LAB — CBC
HCT: 44.4 % (ref 36.0–46.0)
Hemoglobin: 13.3 g/dL (ref 12.0–15.0)
MCH: 26.7 pg (ref 26.0–34.0)
MCHC: 30 g/dL (ref 30.0–36.0)
MCV: 89 fL (ref 80.0–100.0)
Platelets: 356 10*3/uL (ref 150–400)
RBC: 4.99 MIL/uL (ref 3.87–5.11)
RDW: 14.6 % (ref 11.5–15.5)
WBC: 30.6 10*3/uL — ABNORMAL HIGH (ref 4.0–10.5)
nRBC: 0 % (ref 0.0–0.2)

## 2024-02-13 LAB — MAGNESIUM: Magnesium: 2.1 mg/dL (ref 1.7–2.4)

## 2024-02-13 MED ORDER — HYDROMORPHONE HCL 1 MG/ML IJ SOLN
0.5000 mg | INTRAMUSCULAR | Status: DC | PRN
  Administered 2024-02-13 – 2024-02-18 (×20): 0.5 mg via INTRAVENOUS
  Filled 2024-02-13 (×21): qty 0.5

## 2024-02-13 MED ORDER — HYDROMORPHONE HCL 1 MG/ML IJ SOLN: 0.2500 mg | Freq: Once | INTRAMUSCULAR | Status: DC

## 2024-02-13 MED ORDER — IRBESARTAN 150 MG PO TABS
150.0000 mg | ORAL_TABLET | Freq: Every day | ORAL | Status: DC
  Filled 2024-02-13: qty 1

## 2024-02-13 MED ORDER — LACTATED RINGERS IV SOLN: INTRAVENOUS | Status: AC

## 2024-02-13 MED ORDER — INSULIN ASPART 100 UNIT/ML IJ SOLN
0.0000 [IU] | Freq: Three times a day (TID) | INTRAMUSCULAR | Status: DC
  Administered 2024-02-13: 1 [IU] via SUBCUTANEOUS
  Administered 2024-02-14: 2 [IU] via SUBCUTANEOUS

## 2024-02-13 NOTE — Progress Notes (Signed)
 PROGRESS NOTE  Sherry Holloway RUE:[id number] DOB: [birth year]   PCP: Donato Schultz, DO  Patient is from: Home.  DOA: 02/04/2024 LOS: 9  Chief complaints Chief Complaint  Patient presents with   Abdominal Pain     Brief Narrative / Interim history: 89 year old F with PMH of esophageal cancer, chronic calcific pancreatitis, pancreatic insufficiency, biliary stones s/p biliary stents in 10/2023, HTN, HLD, GERD, DM-2 and OSA presented to ED with intermittent RUQ pain and unintentional weight loss since mid January and admitted with working diagnosis of small bowel obstruction.  She was managed conservatively with NG tube and general surgery guidance.  She had a bowel movements and NG tube was removed.  However, she had abdominal distention again with significant leukocytosis.  CT abdomen and pelvis showed abdominal distention with dilated bowels.  GI consulted and general surgery reengaged.  SBO protocol on 2/27 and 2/28 with slow progression of oral contrast within multiple dilated small bowel loops consistent with ileus or partial obstruction. GI and general surgery following.  Subjective: Seen and examined earlier this morning.  No major events overnight or this morning.  She reports abdominal pain but rates her pain 2/10.  Pain is mainly over RUQ.  She thinks she had bowel movements yesterday but does not recall consistency.  She denies nausea or vomiting.  Objective: Vitals:   02/12/24 0407 02/12/24 2030 02/13/24 0648 02/13/24 1437  BP: (!) 159/58 (!) 158/66 (!) 161/74 (!) 187/60  Pulse: 69 75 81 88  Resp: 16 16 16 16   Temp: 98.5 F (36.9 C) 98.4 F (36.9 C) 98.3 F (36.8 C) 97.9 F (36.6 C)  TempSrc: Oral Oral  Oral  SpO2: 96% 96% 93% 95%  Weight:      Height:        Examination:  GENERAL: No apparent distress.  Nontoxic. HEENT: MMM.  Vision and hearing grossly intact.  NECK: Supple.  No apparent JVD.  RESP:  No IWOB.  Fair aeration bilaterally. CVS:  RRR.  Heart sounds normal.  ABD/GI/GU: BS manage.  Slightly distended abdomen.  Diffuse tenderness. MSK/EXT:  Moves extremities. No apparent deformity. No edema.  SKIN: no apparent skin lesion or wound NEURO: Awake, alert and oriented appropriately.  No apparent focal neuro deficit. PSYCH: Calm. Normal affect.   Procedures:  None  Microbiology summarized: 2/24-C. difficile negative.  Assessment and plan: Small bowel obstruction: Initial CT on 2/19 showed SBO with transition point in right anatomic pelvis.  This was treated conservatively with NG tube and bowel rest and resolved.  Now with abdominal distention and dilated bowel.  Imaging concerning for ileus/partial SBO.  Reports bowel movement yesterday.  Denies nausea or vomiting this morning.  Still with diffuse tenderness -GI and general surgery on board. -Discontinue IV morphine -Decrease IV Dilaudid to 0.5 mg every 3 hours as needed moderate and severe pain -N.p.o. except ice chips and sips with meds per surgery -Enema per surgery. -Start IV fluid -Antiemetics, PPI and Carafate -Mobilize patient, PT/OT  Chronic calcific pancreatitis/chronic biliary obstruction/pancreatic insufficiency -S/p multiple ERCPs.  -Continue home Creon when she start taking p.o. -GI on board  Essential hypertension: BP elevated.  Could be due to pain. -Pain control as above -Increase Avapro to 150 mg daily -Continue low-dose amlodipine. -IV hydralazine as needed  NIDDM-2 with hyperglycemia: A1c 7.3% in 10/2023 -Start SSI-very sensitive -Recheck hemoglobin A1c -Hold Jardiance  Hypokalemia -Monitor replenish as appropriate  Hyponatremia: Mild.  Stable. -IV fluid as above   Right subclavian stenosis:  Chronic.  Daughter reported permissive hypertension due to this but no documentation -Outpatient follow-up. -Check blood pressure on left arm.   GERD -Continue PPI, simethicone.     Hyperlipidemia -Resume statin at discharge.   Chronic pain  under -Pain management as above   History of PE, on Xarelto outpatient -Resume Xarelto.   Leukocytosis: Likely due to #1. -Recheck in the morning  Body mass index is 22.74 kg/m.     DVT prophylaxis:  Place and maintain sequential compression device Start: 02/05/24 1041  Code Status: DNR/DNI Family Communication: None at bedside Level of care: Med-Surg Status is: Inpatient Remains inpatient appropriate because: Small bowel distention/obstruction/ileus   Final disposition: To be determined Consultants:  General surgery Gastroenterology  55 minutes with more than 50% spent in reviewing records, counseling patient/family and coordinating care.   Sch Meds:  Scheduled Meds:  amLODipine  5 mg Oral Daily   feeding supplement  1 Container Oral TID BM   insulin aspart  0-6 Units Subcutaneous TID WC   [START ON 02/14/2024] irbesartan  150 mg Oral Daily   latanoprost  1 drop Both Eyes QHS   lipase/protease/amylase  36,000 Units Oral BID AC   mirtazapine  15 mg Oral QHS   pantoprazole  40 mg Oral Daily   sucralfate  1 g Oral BID   Continuous Infusions:  lactated ringers     PRN Meds:.acetaminophen, alum & mag hydroxide-simeth, hydrALAZINE, HYDROmorphone (DILAUDID) injection, iohexol, magic mouthwash, menthol-cetylpyridinium, ondansetron **OR** ondansetron (ZOFRAN) IV, mouth rinse, simethicone  Antimicrobials: Anti-infectives (From admission, onward)    None        I have personally reviewed the following labs and images: CBC: Recent Labs  Lab 02/07/24 0519 02/08/24 0434 02/09/24 0425 02/11/24 0442 02/12/24 0434  WBC 14.0* 10.4 9.3 19.0* 20.1*  HGB 11.3* 10.9* 11.3* 11.7* 12.7  HCT 36.0 34.5* 35.5* 35.3* 39.3  MCV 86.7 86.3 85.7 84.2 85.2  PLT 247 241 229 259 278   BMP &GFR Recent Labs  Lab 02/08/24 0434 02/09/24 0425 02/11/24 0442 02/12/24 0434 02/13/24 0847  NA 133* 134* 130* 130* 130*  K 4.2 3.8 3.1* 3.3* 3.9  CL 102 100 96* 98 100  CO2 25 25 24  22  20*  GLUCOSE 122* 127* 158* 173* 191*  BUN 11 10 12 12 23   CREATININE 0.51 0.56 0.32* 0.54 0.53  CALCIUM 9.0 8.7* 8.8* 9.3 9.5  MG  --   --   --  2.0 2.1  PHOS  --   --   --  2.9  --    Estimated Creatinine Clearance: 37.1 mL/min (by C-G formula based on SCr of 0.53 mg/dL). Liver & Pancreas: Recent Labs  Lab 02/11/24 0442 02/12/24 0434 02/13/24 0847  AST 8* 9* 8*  ALT 15 16 16   ALKPHOS 62 74 86  BILITOT 0.5 0.5 0.6  PROT 5.3* 5.6* 5.9*  ALBUMIN 2.4* 2.5* 2.5*   No results for input(s): "LIPASE", "AMYLASE" in the last 168 hours. No results for input(s): "AMMONIA" in the last 168 hours. Diabetic: No results for input(s): "HGBA1C" in the last 72 hours. No results for input(s): "GLUCAP" in the last 168 hours. Cardiac Enzymes: No results for input(s): "CKTOTAL", "CKMB", "CKMBINDEX", "TROPONINI" in the last 168 hours. Recent Labs    08/04/23 1422  PROBNP 251.0*   Coagulation Profile: No results for input(s): "INR", "PROTIME" in the last 168 hours. Thyroid Function Tests: No results for input(s): "TSH", "T4TOTAL", "FREET4", "T3FREE", "THYROIDAB" in the last 72 hours. Lipid  Profile: No results for input(s): "CHOL", "HDL", "LDLCALC", "TRIG", "CHOLHDL", "LDLDIRECT" in the last 72 hours. Anemia Panel: No results for input(s): "VITAMINB12", "FOLATE", "FERRITIN", "TIBC", "IRON", "RETICCTPCT" in the last 72 hours. Urine analysis:    Component Value Date/Time   COLORURINE YELLOW 02/04/2024 1506   APPEARANCEUR CLEAR 02/04/2024 1506   LABSPEC 1.015 02/04/2024 1506   PHURINE 5.5 02/04/2024 1506   GLUCOSEU >=500 (A) 02/04/2024 1506   HGBUR NEGATIVE 02/04/2024 1506   BILIRUBINUR NEGATIVE 02/04/2024 1506   BILIRUBINUR negative 08/04/2023 1456   KETONESUR 40 (A) 02/04/2024 1506   PROTEINUR 100 (A) 02/04/2024 1506   UROBILINOGEN 0.2 08/04/2023 1456   NITRITE NEGATIVE 02/04/2024 1506   LEUKOCYTESUR NEGATIVE 02/04/2024 1506   Sepsis Labs: Invalid input(s): "PROCALCITONIN",  "LACTICIDVEN"  Microbiology: Recent Results (from the past 240 hours)  C Difficile Quick Screen (NO PCR Reflex)     Status: None   Collection Time: 02/09/24  1:48 PM   Specimen: STOOL  Result Value Ref Range Status   C Diff antigen NEGATIVE NEGATIVE Final   C Diff toxin NEGATIVE NEGATIVE Final   C Diff interpretation No C. difficile detected.  Final    Comment: Performed at [facility], [address], [place], [place] [address]    Radiology Studies: DG Abd Portable 1V Result Date: 02/13/2024 CLINICAL DATA:  Small bowel obstruction. EXAM: PORTABLE ABDOMEN - 1 VIEW COMPARISON:  Abdominal x-ray from yesterday. FINDINGS: Slight progression of oral contrast within multiple dilated small bowel loops, specifically in the right abdomen. Considerable amount of contrast remains in the gastric fundus. No significant interval change in oral contrast and prominent stool in the left colon. No definite oral contrast in the right colon. IMPRESSION: 1. Slow progression of oral contrast within multiple dilated small bowel loops, consistent with ileus or partial obstruction. Electronically Signed   By: Obie Dredge M.D.   On: 02/13/2024 12:06   DG Abd Portable 1V-Small Bowel Obstruction Protocol-initial, 8 hr delay Result Date: 02/13/2024 CLINICAL DATA:  8 hour small-bowel follow-through EXAM: PORTABLE ABDOMEN - 1 VIEW COMPARISON:  Film from earlier in the same day. FINDINGS: Contrast is noted within the stomach. Scattered contrast is noted throughout the small bowel. Previously seen contrast in the colon is again identified. No right sided colonic contrast is seen to suggest adequate passage. Biliary stent and stimulator wires are seen. No free air is noted. IMPRESSION: No contrast is noted within the right colon from the recently administered contrast. 24 hour follow-up film is recommended. Electronically Signed   By: Alcide Clever M.D.   On: 02/13/2024 00:14      Santosha Jividen T. Evelina Lore [facility]  Hospitalist  If 7PM-7AM, please contact night-coverage [email or web address] 02/13/2024, 2:48 PM

## 2024-02-13 NOTE — Progress Notes (Signed)
 PT Cancellation Note  Patient Details Name: Sherry Holloway MRN: [id number] DOB: [birth year]   Cancelled Treatment:     Pt unable to participate due to issues of distended ABD/stool burden awaiting NG tube placement.  Daughter stated, pt was OOB earlier in the recliner and to and from the [facility].  Currently back in bed resting.  Acute Rehab Team to continue to monitor and attempt to see another day.  Felecia Shelling  PTA Acute  Rehabilitation Services Office M-F          [phone]

## 2024-02-13 NOTE — Progress Notes (Addendum)
 Daily Progress Note  DOA: 02/04/2024 Hospital Day: 10   Chief Complaint: abdominal pain, nausea  ASSESSMENT    Brief Narrative:  Sherry Holloway is a 89 y.o. 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 Vomited yesterday , worsening abdominal distention today and today's KUB showing slight progression of oral contrast within multiple dilated small bowel loops, specifically in right abdomen. No definite contrast in right colon. Also with prominent stool in left colon.  Findings suggestive of ileus or partial obstruction.  General Surgery is following.    ADDENDUM  SBFT today showing no contrast  within the right colon from the recently administered contrast. 24 hour follow-up film is recommended.  History of recurrent choledocholithiasis. Biliary stent in place As a result of her anatomy (ampullary diverticulum) and a main duct IPMN she has had recurrent biliary obstructions related to significant mucous plugging. She is s/p multiple ERCPs with sphincterotomy, stone extraction, lithotripsy,  and stenting. Not a surgical candidate due to age 19 recent ERCP completed in November at which time bile duct was only partially cleared but stent placed.     Chronic calcific pancreatitis Pancreatic insufficiency  Main duct IPMN    Principal Problem:   SBO (small bowel obstruction) (HCC) Active Problems:   Pancreatic mass   Gastroesophageal reflux disease with esophagitis without hemorrhage   Hypertension   Uncontrolled type 2 diabetes mellitus with hyperglycemia, without long-term  current use of insulin (HCC)   Hyperlipidemia   OSA (obstructive sleep apnea)   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)   Type 2 diabetes mellitus with diabetic polyneuropathy, without long-term current use of insulin (HCC)   Chronic pain disorder   Intractable nausea and vomiting   Abdominal pain, chronic, right lower quadrant   PLAN   -- Not clear if ileus or partial SBO. Unfortunately she is still requiring narcotics which complicates treatment of ileus. Potassium repletion in progress and K+ normal today at 3.9. Mg+ normal as well Not very mobile but daughter trying to roll her periodically in bed.  --She is NPO for now. Has IV fluids going. --Will give enema to clean out left colon .    Addendum; small bowel follow through today not showing contrast in right colon . Will discuss with Surgery  Subjective   Vomited x 3 yesterday. Had soft BM after enema yesterday. No BMs today. No further vomiting. Currently NPO   Objective    Recent Labs    02/11/24 0442 02/12/24 0434  WBC 19.0* 20.1*  HGB 11.7* 12.7  HCT 35.3* 39.3  PLT 259 278   BMET Recent Labs    02/11/24 0442 02/12/24 0434 02/13/24 0847  NA 130* 130* 130*  K 3.1* 3.3* 3.9  CL 96* 98 100  CO2 24 22 20*  GLUCOSE 158* 173* 191*  BUN 12 12 23   CREATININE 0.32* 0.54 0.53  CALCIUM 8.8* 9.3 9.5   LFT Recent Labs    02/13/24 0847  PROT 5.9*  ALBUMIN 2.5*  AST 8*  ALT 16  ALKPHOS 86  BILITOT 0.6   PT/INR No results for input(s): "LABPROT", "INR" in the last 72 hours.  Imaging:  DG Abd Portable 1V-Small Bowel Obstruction Protocol-initial, 8 hr delay CLINICAL DATA:  8 hour small-bowel follow-through  EXAM: PORTABLE ABDOMEN - 1 VIEW  COMPARISON:  Film from earlier in the same day.  FINDINGS: Contrast is noted within the stomach. Scattered contrast is noted throughout the small bowel. Previously seen contrast in the colon is again identified. No right sided colonic contrast is seen to  suggest adequate passage. Biliary stent and stimulator wires are seen. No free air is noted.  IMPRESSION: No contrast is noted within the right colon from the recently administered contrast. 24 hour follow-up film is recommended.  Electronically Signed   By: Alcide Clever M.D.   On: 02/13/2024 00:14     Scheduled inpatient medications:   amLODipine  5 mg Oral Daily   feeding supplement  1 Container Oral TID BM   irbesartan  75 mg Oral Daily   latanoprost  1 drop Both Eyes QHS   lipase/protease/amylase  36,000 Units Oral BID AC   mirtazapine  15 mg Oral QHS   pantoprazole  40 mg Oral Daily   sucralfate  1 g Oral BID   Continuous inpatient infusions:   0.9 % NaCl with KCl 40 mEq / L 75 mL/hr at 02/13/24 0351   PRN inpatient medications: acetaminophen, alum & mag hydroxide-simeth, hydrALAZINE, HYDROmorphone (DILAUDID) injection, iohexol, magic mouthwash, menthol-cetylpyridinium, morphine injection, ondansetron **OR** ondansetron (ZOFRAN) IV, mouth rinse, simethicone  Vital signs in last 24 hours: Temp:  [98.3 F (36.8 C)-98.4 F (36.9 C)] 98.3 F (36.8 C) (02/28 0648) Pulse Rate:  [75-81] 81 (02/28 0648) Resp:  [16] 16 (02/28 0648) BP: (158-161)/(66-74) 161/74 (02/28 0648) SpO2:  [93 %-96 %] 93 % (02/28 0648) Last BM Date : 02/11/24  Intake/Output Summary (Last 24 hours) at 02/13/2024 1144 Last data filed at 02/12/2024 2100 Gross per 24 hour  Intake 237 ml  Output --  Net 237 ml    Intake/Output from previous day: 02/27 0701 - 02/28 0700 In: 237 [P.O.:237] Out: -  Intake/Output this shift: No intake/output data recorded.   Physical Exam:  General: Alert female in NAD Heart:  Regular rate and rhythm.  Pulmonary: Normal respiratory effort Abdomen: Soft but more distended compared to yesterday. Hypoactive bowel sounds, mild RLQ tenderness.. Extremities: No lower extremity edema  Neurologic: Alert and oriented Psych: Pleasant. Cooperative.       LOS: 9 days    Willette Cluster ,NP 02/13/2024, 11:44 AM  GI ATTENDING  Interval history data reviewed.  Follow-up x-rays reviewed.  Patient doing worse past 24 hours.  Differential remains the same, ileus (narcotics, age, illness, immobility, electrolyte disturbance induced) versus partial SBO.  Surgery following along.  Multiple measures instituted to address reversible abnormalities.  Narcotics remain a confounding variable.  Difficult situation.  The patient's daughter contacted our office today with concerns over her condition and concerns that she will not be able to undergo for elective ERCP scheduled for next week.  Dr. Meridee Score is in the hospital next week.  Will continue to follow.  Wilhemina Bonito. Eda Keys., M.D. [facility] of Gastroenterology      ]

## 2024-02-13 NOTE — Telephone Encounter (Signed)
 JP, Thank you for the update.  Patty, Go ahead and cancel this patient's ERCP for next week. We can discuss on Monday/Tuesday, if we can find a different patient to add-on. Reschedule ERCP for 1-2 months down the road. Thanks. GM

## 2024-02-13 NOTE — Progress Notes (Signed)
 Subjective/Chief Complaint: Feels better.  She states she is passing flatus   Objective: Vital signs in last 24 hours: Temp:  [98.3 F (36.8 C)-98.4 F (36.9 C)] 98.3 F (36.8 C) (02/28 0648) Pulse Rate:  [75-81] 81 (02/28 0648) Resp:  [16] 16 (02/28 0648) BP: (158-161)/(66-74) 161/74 (02/28 0648) SpO2:  [93 %-96 %] 93 % (02/28 0648) Last BM Date : 02/11/24  Intake/Output from previous day: 02/27 0701 - 02/28 0700 In: 237 [P.O.:237] Out: -  Intake/Output this shift: No intake/output data recorded.  General appearance: alert and cooperative Resp: clear to auscultation bilaterally Cardio: regular rate and rhythm GI: soft, distended. Moderate tenderness  Lab Results:  Recent Labs    02/11/24 0442 02/12/24 0434  WBC 19.0* 20.1*  HGB 11.7* 12.7  HCT 35.3* 39.3  PLT 259 278   BMET Recent Labs    02/11/24 0442 02/12/24 0434  NA 130* 130*  K 3.1* 3.3*  CL 96* 98  CO2 24 22  GLUCOSE 158* 173*  BUN 12 12  CREATININE 0.32* 0.54  CALCIUM 8.8* 9.3   PT/INR No results for input(s): "LABPROT", "INR" in the last 72 hours. ABG No results for input(s): "PHART", "HCO3" in the last 72 hours.  Invalid input(s): "PCO2", "PO2"  Studies/Results: DG Abd Portable 1V-Small Bowel Obstruction Protocol-initial, 8 hr delay Result Date: 02/13/2024 CLINICAL DATA:  8 hour small-bowel follow-through EXAM: PORTABLE ABDOMEN - 1 VIEW COMPARISON:  Film from earlier in the same day. FINDINGS: Contrast is noted within the stomach. Scattered contrast is noted throughout the small bowel. Previously seen contrast in the colon is again identified. No right sided colonic contrast is seen to suggest adequate passage. Biliary stent and stimulator wires are seen. No free air is noted. IMPRESSION: No contrast is noted within the right colon from the recently administered contrast. 24 hour follow-up film is recommended. Electronically Signed   By: Alcide Clever M.D.   On: 02/13/2024 00:14   DG Abd  Portable 1V Result Date: 02/12/2024 CLINICAL DATA:  Nausea, vomiting. EXAM: PORTABLE ABDOMEN - 1 VIEW COMPARISON:  February 08, 2024. FINDINGS: No abnormal bowel dilatation is noted. Moderate amount of stool seen throughout colon. Common bile duct stents are noted. Oral contrast is seen in the sigmoid colon and rectum. IMPRESSION: No abnormal bowel dilatation.  Moderate stool burden. Electronically Signed   By: Lupita Raider M.D.   On: 02/12/2024 15:00    Anti-infectives: Anti-infectives (From admission, onward)    None       Assessment/Plan: s/p * No surgery found * Partial sbo persistent Recheck abd xray SBO vs ileus in setting of Chronic constipation  -  CT scan of the abdomen and pelvis showed SBO with transition point in RLQ. No pneumoperitoneum or signs of bowel ischemia on CT. Visible biliary stents noted in place from her history of choledocholithiasis 10/2023. Gallbladder surgically absent.  - SBO protocol 2/21 - contrast passed into colon - patient having liquid stools but distention and n/v also, significant constipation at baseline - KUB today with stool burden in ascending and descending colon  - recommend enema from below and giving PO gastrografin, will get 8h delay after gastrografin - NPO except ice chips and sips  - mobility as tolerated  - Keep K>4.0 and Mg >2.0 - GI following - if able to pass contrast then would recommend GI take over management of chronic bowel dysmotility and can follow up outpatient for that as well     FEN: NPO, ice  chips, IVF with 40 KCl @75  cc/h VTE: xarelto resumed 2/21 ID: no current abx   - per TRH -  HTN HLD OSA Hx if uterine cancer Hx of esophageal cancer GERD T2DM  LOS: 9 days    Chevis Pretty III 02/13/2024

## 2024-02-13 NOTE — Telephone Encounter (Signed)
 I am sorry to hear this. I actually will be on Service next week and hope that her mom is out of the hospital by then. We can work on rescheduling her ERCP if needed, based on updates from the team today. GM

## 2024-02-14 ENCOUNTER — Other Ambulatory Visit: Payer: Self-pay

## 2024-02-14 ENCOUNTER — Inpatient Hospital Stay (HOSPITAL_COMMUNITY)

## 2024-02-14 DIAGNOSIS — K56609 Unspecified intestinal obstruction, unspecified as to partial versus complete obstruction: Secondary | ICD-10-CM | POA: Diagnosis not present

## 2024-02-14 DIAGNOSIS — D72829 Elevated white blood cell count, unspecified: Secondary | ICD-10-CM | POA: Diagnosis not present

## 2024-02-14 DIAGNOSIS — R1031 Right lower quadrant pain: Secondary | ICD-10-CM | POA: Diagnosis not present

## 2024-02-14 DIAGNOSIS — K567 Ileus, unspecified: Secondary | ICD-10-CM | POA: Diagnosis not present

## 2024-02-14 DIAGNOSIS — E1165 Type 2 diabetes mellitus with hyperglycemia: Secondary | ICD-10-CM | POA: Diagnosis not present

## 2024-02-14 DIAGNOSIS — E1142 Type 2 diabetes mellitus with diabetic polyneuropathy: Secondary | ICD-10-CM | POA: Diagnosis not present

## 2024-02-14 LAB — COMPREHENSIVE METABOLIC PANEL
ALT: 15 U/L (ref 0–44)
AST: 10 U/L — ABNORMAL LOW (ref 15–41)
Albumin: 2.1 g/dL — ABNORMAL LOW (ref 3.5–5.0)
Alkaline Phosphatase: 80 U/L (ref 38–126)
Anion gap: 11 (ref 5–15)
BUN: 27 mg/dL — ABNORMAL HIGH (ref 8–23)
CO2: 23 mmol/L (ref 22–32)
Calcium: 9.5 mg/dL (ref 8.9–10.3)
Chloride: 103 mmol/L (ref 98–111)
Creatinine, Ser: 0.54 mg/dL (ref 0.44–1.00)
GFR, Estimated: >60 mL/min (ref >60)
Glucose, Bld: 176 mg/dL — ABNORMAL HIGH (ref 70–99)
Potassium: 3.7 mmol/L (ref 3.5–5.1)
Sodium: 137 mmol/L (ref 135–145)
Total Bilirubin: 0.8 mg/dL (ref 0.0–1.2)
Total Protein: 5.4 g/dL — ABNORMAL LOW (ref 6.5–8.1)

## 2024-02-14 LAB — URINALYSIS, ROUTINE W REFLEX MICROSCOPIC
Bacteria, UA: NONE SEEN
Bilirubin Urine: NEGATIVE
Glucose, UA: >=500 mg/dL — AB
Hgb urine dipstick: NEGATIVE
Ketones, ur: 20 mg/dL — AB
Leukocytes,Ua: NEGATIVE
Nitrite: NEGATIVE
Protein, ur: 30 mg/dL — AB
Specific Gravity, Urine: >1.046 — ABNORMAL HIGH (ref 1.005–1.030)
pH: 5 (ref 5.0–8.0)

## 2024-02-14 LAB — CBC WITH DIFFERENTIAL/PLATELET
Abs Immature Granulocytes: 0.21 10*3/uL — ABNORMAL HIGH (ref 0.00–0.07)
Basophils Absolute: 0.1 10*3/uL (ref 0.0–0.1)
Basophils Relative: 0 %
Eosinophils Absolute: 0 10*3/uL (ref 0.0–0.5)
Eosinophils Relative: 0 %
HCT: 35.9 % — ABNORMAL LOW (ref 36.0–46.0)
Hemoglobin: 11.1 g/dL — ABNORMAL LOW (ref 12.0–15.0)
Immature Granulocytes: 1 %
Lymphocytes Relative: 3 %
Lymphs Abs: 0.8 10*3/uL (ref 0.7–4.0)
MCH: 27.1 pg (ref 26.0–34.0)
MCHC: 30.9 g/dL (ref 30.0–36.0)
MCV: 87.8 fL (ref 80.0–100.0)
Monocytes Absolute: 1.5 10*3/uL — ABNORMAL HIGH (ref 0.1–1.0)
Monocytes Relative: 5 %
Neutro Abs: 27.3 10*3/uL — ABNORMAL HIGH (ref 1.7–7.7)
Neutrophils Relative %: 91 %
Platelets: 317 10*3/uL (ref 150–400)
RBC: 4.09 MIL/uL (ref 3.87–5.11)
RDW: 14.6 % (ref 11.5–15.5)
Smear Review: NORMAL
WBC: 29.9 10*3/uL — ABNORMAL HIGH (ref 4.0–10.5)
nRBC: 0 % (ref 0.0–0.2)

## 2024-02-14 LAB — LACTIC ACID, PLASMA
Lactic Acid, Venous: 1.1 mmol/L (ref 0.5–1.9)
Lactic Acid, Venous: 1 mmol/L (ref 0.5–1.9)

## 2024-02-14 LAB — GLUCOSE, CAPILLARY
Glucose-Capillary: 124 mg/dL — ABNORMAL HIGH (ref 70–99)
Glucose-Capillary: 141 mg/dL — ABNORMAL HIGH (ref 70–99)
Glucose-Capillary: 143 mg/dL — ABNORMAL HIGH (ref 70–99)
Glucose-Capillary: 149 mg/dL — ABNORMAL HIGH (ref 70–99)
Glucose-Capillary: 167 mg/dL — ABNORMAL HIGH (ref 70–99)

## 2024-02-14 LAB — HEMOGLOBIN A1C
Hgb A1c MFr Bld: 7.7 % — ABNORMAL HIGH (ref 4.8–5.6)
Mean Plasma Glucose: 174.29 mg/dL

## 2024-02-14 LAB — MAGNESIUM: Magnesium: 1.9 mg/dL (ref 1.7–2.4)

## 2024-02-14 LAB — PHOSPHORUS: Phosphorus: 2.5 mg/dL (ref 2.5–4.6)

## 2024-02-14 MED ORDER — ONDANSETRON HCL 4 MG/2ML IJ SOLN: 4.0000 mg | Freq: Four times a day (QID) | INTRAMUSCULAR | Status: DC | PRN

## 2024-02-14 MED ORDER — BISACODYL 10 MG RE SUPP
10.0000 mg | Freq: Every day | RECTAL | Status: DC
  Administered 2024-02-14 – 2024-02-17 (×4): 10 mg via RECTAL
  Filled 2024-02-14 (×6): qty 1

## 2024-02-14 MED ORDER — PANTOPRAZOLE SODIUM 40 MG IV SOLR
40.0000 mg | Freq: Every day | INTRAVENOUS | Status: DC
  Administered 2024-02-14 – 2024-02-19 (×6): 40 mg via INTRAVENOUS
  Filled 2024-02-14 (×7): qty 10

## 2024-02-14 MED ORDER — IOHEXOL 300 MG/ML  SOLN
100.0000 mL | Freq: Once | INTRAMUSCULAR | Status: AC | PRN
  Administered 2024-02-14: 100 mL via INTRAVENOUS

## 2024-02-14 MED ORDER — LACTATED RINGERS IV BOLUS
1000.0000 mL | Freq: Three times a day (TID) | INTRAVENOUS | Status: AC | PRN
  Administered 2024-02-15: 1000 mL via INTRAVENOUS

## 2024-02-14 MED ORDER — PROCHLORPERAZINE EDISYLATE 10 MG/2ML IJ SOLN
5.0000 mg | INTRAMUSCULAR | Status: DC | PRN
  Administered 2024-02-14: 10 mg via INTRAVENOUS
  Filled 2024-02-14: qty 2

## 2024-02-14 MED ORDER — MENTHOL 3 MG MT LOZG: 1.0000 | LOZENGE | OROMUCOSAL | Status: DC | PRN

## 2024-02-14 MED ORDER — PIPERACILLIN-TAZOBACTAM 3.375 G IVPB
3.3750 g | Freq: Three times a day (TID) | INTRAVENOUS | Status: DC
  Administered 2024-02-14 – 2024-02-20 (×19): 3.375 g via INTRAVENOUS
  Filled 2024-02-14 (×19): qty 50

## 2024-02-14 MED ORDER — NAPHAZOLINE-GLYCERIN 0.012-0.25 % OP SOLN: 1.0000 [drp] | Freq: Four times a day (QID) | OPHTHALMIC | Status: DC | PRN

## 2024-02-14 MED ORDER — THIAMINE HCL 100 MG/ML IJ SOLN
100.0000 mg | Freq: Once | INTRAMUSCULAR | Status: AC
  Administered 2024-02-14: 100 mg via INTRAVENOUS
  Filled 2024-02-14: qty 2

## 2024-02-14 MED ORDER — THIAMINE HCL 100 MG/ML IJ SOLN
100.0000 mg | INTRAMUSCULAR | Status: DC
  Administered 2024-02-15 – 2024-02-17 (×3): 100 mg via INTRAVENOUS
  Filled 2024-02-14 (×3): qty 2

## 2024-02-14 MED ORDER — SALINE SPRAY 0.65 % NA SOLN: 1.0000 | Freq: Four times a day (QID) | NASAL | Status: DC | PRN

## 2024-02-14 MED ORDER — PHENOL 1.4 % MT LIQD: 2.0000 | OROMUCOSAL | Status: DC | PRN

## 2024-02-14 MED ORDER — PANCRELIPASE (LIP-PROT-AMYL) 10440-39150 UNITS PO TABS
20880.0000 [IU] | ORAL_TABLET | Freq: Once | ORAL | Status: DC
  Filled 2024-02-14: qty 2

## 2024-02-14 MED ORDER — ACETAMINOPHEN 650 MG RE SUPP
650.0000 mg | Freq: Four times a day (QID) | RECTAL | Status: DC | PRN
Start: 2024-02-14 — End: 2024-02-20

## 2024-02-14 MED ORDER — INSULIN ASPART 100 UNIT/ML IJ SOLN: 0.0000 [IU] | INTRAMUSCULAR | Status: DC

## 2024-02-14 MED ORDER — MAGIC MOUTHWASH
15.0000 mL | Freq: Four times a day (QID) | ORAL | Status: DC | PRN
Start: 2024-02-14 — End: 2024-02-20

## 2024-02-14 MED ORDER — INSULIN ASPART 100 UNIT/ML IJ SOLN
0.0000 [IU] | INTRAMUSCULAR | Status: DC
  Administered 2024-02-14 – 2024-02-15 (×5): 2 [IU] via SUBCUTANEOUS
  Administered 2024-02-15: 3 [IU] via SUBCUTANEOUS
  Administered 2024-02-16 (×3): 5 [IU] via SUBCUTANEOUS
  Administered 2024-02-16: 3 [IU] via SUBCUTANEOUS
  Administered 2024-02-16 (×2): 5 [IU] via SUBCUTANEOUS
  Administered 2024-02-17 (×3): 3 [IU] via SUBCUTANEOUS

## 2024-02-14 MED ORDER — SODIUM BICARBONATE 650 MG PO TABS: 650.0000 mg | ORAL_TABLET | Freq: Once | ORAL | Status: DC

## 2024-02-14 MED ORDER — IOHEXOL 300 MG/ML  SOLN
30.0000 mL | Freq: Once | INTRAMUSCULAR | Status: AC | PRN
  Administered 2024-02-14: 30 mL via ORAL

## 2024-02-14 MED ORDER — ALUM & MAG HYDROXIDE-SIMETH 200-200-20 MG/5ML PO SUSP: 30.0000 mL | Freq: Four times a day (QID) | ORAL | Status: DC | PRN

## 2024-02-14 NOTE — Progress Notes (Signed)
 Initial Nutrition Assessment  DOCUMENTATION CODES:   Not applicable  INTERVENTION:  - GI planning to start TPN (PICC placement pending).  - TPN management per pharmacy.   - Monitor magnesium, potassium, and phosphorus BID for at least 3 days, MD to replete as needed, as pt is at risk for refeeding syndrome given significant weight loss and inadequate intake since admission.  - 100mg  Thiamine x5 days.  - Monitor weight trends.  - Will monitor for diet advancement.    NUTRITION DIAGNOSIS:   Inadequate oral intake related to inability to eat, altered GI function as evidenced by NPO status.  GOAL:   Patient will meet greater than or equal to 90% of their needs  MONITOR:   Diet advancement, Labs, Weight trends  REASON FOR ASSESSMENT:   Consult New TPN/TNA  ASSESSMENT:   89 year old F with PMH of esophageal cancer, chronic calcific pancreatitis, pancreatic insufficiency, biliary stones s/p biliary stents in 10/2023, HTN, HLD, GERD, DM-2 and OSA who presented with intermittent RUQ pain and unintentional weight loss since mid January and admitted with working diagnosis of small bowel obstruction.   2/19 Admit; CLD 2/20 NPO; NGT placed 2/21: FLD; NGT removed 2/22: Soft diet 2/23: CLD 2/24: Soft diet 2/27: CLD -> NPO 2/28: NGT placed 3/1: NGT removed and replaced with new NGT; starting TPN  RD working remotely. Unable to reach patient via bedside telephone.   Per chart review, weight stable until November. Over the past 3 months patient has lost from 152# to 130#. This is a 22# or 14.5% weight loss in 3 months, which is significant for the time frame.   Patient has been on several different diets since admission. Was consuming 25-75% of meals. Consuming Boost Breeze 0-2x/day.  CT abd/pelvis today shows adynamic ileus vs low-grade partial SBO.  GI planning to start TPN. Given PICC/TPN order placed after 1400, will likely start tomorrow.   Patient at risk for refeeding  syndrome given significant weight loss and suspected inadequate oral intake since admission. Patient already ordered thiamine supplementation today.     Medications reviewed and include: Dulcolax, 100mg  thiamine x5 days  Labs reviewed:  - HA1C 7.7 Blood Glucose 141-178 x24 hours   NUTRITION - FOCUSED PHYSICAL EXAM:  RD working remotely  Diet Order:   Diet Order             Diet NPO time specified Except for: Ice Chips  Diet effective now                   EDUCATION NEEDS:  Not appropriate for education at this time  Skin:  Skin Assessment: Reviewed RN Assessment  Last BM:  2/26  Height:  Ht Readings from Last 1 Encounters:  02/04/24 5\' 4"  (1.626 m)   Weight:  Wt Readings from Last 1 Encounters:  02/11/24 60.1 kg    BMI:  Body mass index is 22.74 kg/m.  Estimated Nutritional Needs:  Kcal:  1500-1750 kcals Protein:  75-90 grams Fluid:  >/= 1.5L    Shelle Iron RD, LDN Contact via Secure Chat.

## 2024-02-14 NOTE — Progress Notes (Addendum)
 Resistance from NG tube while trying to administer meds and contrast through NG tube. Unable to flush through NG tube at this time. Charge nurse notified, [facility] contacted on plan/policy to unclog NG tube.   Unsuccessful attempt to irrigated/unclog NGT. NGT placed 2/28 @ 1506 removed 3/1 @ 1215 due to blockage AEB gastric content at the tip of tube  New NGT placed 60cm --1440: x-ray showed NG tube coiled at esophagus, Romie Levee order NG tube to advance 10cm in attempt to get NG out of Esophagus, Resistance still met, no residual, NGT removed.   Rapid Response nurse successfully place NGT at 1530, hooked up to low intermittent suctioning.

## 2024-02-14 NOTE — Significant Event (Signed)
 Rapid Response Event Note   Reason for Call :  NGT placement   Initial Focused Assessment:  Bedside RN attempted NGT placement 2X, both times NGT coiled in stomach. This RN placed 34F NGT and set it to LIS, green gastric content appear in tube. STAT KUB placed to verify tube placement. If tube placement not satisfactory consider IR consult.  Patient tolerated NGT placement well with daughter at bedside   Teresita Madura, RN

## 2024-02-14 NOTE — Progress Notes (Addendum)
 Patient ID: Sherry Holloway, female   DOB: 1928/01/22, 89 y.o.   MRN: [id number]    Progress Note   Subjective   Day #10 CC;abdominal pain, nausea  SBFT 02/12/2024-no contrast in the right colon from recently administered contrast, Abdominal films yesterday-CBD stent, dorsal spinal stimulator prominent gas-filled loops of upper abdominal small bowel relatively similar  Patient's daughter at bedside, she feels that her mom has declined over the past 24 hours and is complaining of worsening pain.  Patient endorses fairly severe pain in the right lower quadrant around and below the spinal stimulator abdomen is distended, no flatus  NG placed yesterday about 1 L out thus far  WBC today 29,000/hemoglobin 11.1/hematocrit 35.9 Lactate pending BUN 27/creatinine 0.54   Objective   Vital signs in last 24 hours: Temp:  [97.9 F (36.6 C)-98 F (36.7 C)] 97.9 F (36.6 C) (03/01 0437) Pulse Rate:  [82-95] 82 (03/01 0437) Resp:  [16-18] 18 (03/01 0437) BP: (160-187)/(51-62) 169/51 (03/01 0437) SpO2:  [93 %-95 %] 93 % (03/01 0437) Last BM Date : 02/11/24 General:    Elderly white female in NAD Heart:  Regular rate and rhythm; no murmurs Lungs: Respirations even and unlabored, lungs CTA bilaterally Abdomen: Distended, hyperactive fairly high-pitched bowel sounds, rather diffusely tender with guarding, marked tenderness in the right lower quadrant and area of spinal stimulator Extremities:  Without edema. Neurologic:  Alert and oriented,  grossly normal neurologically. Psych:  Cooperative. Normal mood and affect.  Intake/Output from previous day: 02/28 0701 - 03/01 0700 In: 876.6 [I.V.:876.6] Out: 1750 [Urine:800; Emesis/NG output:950] Intake/Output this shift: No intake/output data recorded.  Lab Results: Recent Labs    02/12/24 0434 02/13/24 1813 02/14/24 0522  WBC 20.1* 30.6* 29.9*  HGB 12.7 13.3 11.1*  HCT 39.3 44.4 35.9*  PLT 278 356 317   BMET Recent Labs     02/12/24 0434 02/13/24 0847 02/14/24 0522  NA 130* 130* 137  K 3.3* 3.9 3.7  CL 98 100 103  CO2 22 20* 23  GLUCOSE 173* 191* 176*  BUN 12 23 27*  CREATININE 0.54 0.53 0.54  CALCIUM 9.3 9.5 9.5   LFT Recent Labs    02/14/24 0522  PROT 5.4*  ALBUMIN 2.1*  AST 10*  ALT 15  ALKPHOS 80  BILITOT 0.8   PT/INR No results for input(s): "LABPROT", "INR" in the last 72 hours.  Studies/Results: DG Abd Portable 1V Result Date: 02/13/2024 CLINICAL DATA:  NG tube placement EXAM: PORTABLE ABDOMEN - 1 VIEW COMPARISON:  Earlier today at 7:57 a.m. FINDINGS: Nasogastric tube terminates at the gastric body with side port well below the gastroesophageal junction. Dorsal spinal stimulator. Common duct stent. Cholecystectomy. No gross free intraperitoneal air. Prominent gas-filled loops of upper abdominal small bowel are relatively similar. Contrast remains within the stomach. IMPRESSION: Nasogastric tube appropriately positioned. Electronically Signed   By: Jeronimo Greaves M.D.   On: 02/13/2024 19:49   DG Abd Portable 1V Result Date: 02/13/2024 CLINICAL DATA:  Small bowel obstruction. EXAM: PORTABLE ABDOMEN - 1 VIEW COMPARISON:  Abdominal x-ray from yesterday. FINDINGS: Slight progression of oral contrast within multiple dilated small bowel loops, specifically in the right abdomen. Considerable amount of contrast remains in the gastric fundus. No significant interval change in oral contrast and prominent stool in the left colon. No definite oral contrast in the right colon. IMPRESSION: 1. Slow progression of oral contrast within multiple dilated small bowel loops, consistent with ileus or partial obstruction. Electronically Signed   By: Chrissie Noa  Howell Pringle M.D.   On: 02/13/2024 12:06   DG Abd Portable 1V-Small Bowel Obstruction Protocol-initial, 8 hr delay Result Date: 02/13/2024 CLINICAL DATA:  8 hour small-bowel follow-through EXAM: PORTABLE ABDOMEN - 1 VIEW COMPARISON:  Film from earlier in the same day.  FINDINGS: Contrast is noted within the stomach. Scattered contrast is noted throughout the small bowel. Previously seen contrast in the colon is again identified. No right sided colonic contrast is seen to suggest adequate passage. Biliary stent and stimulator wires are seen. No free air is noted. IMPRESSION: No contrast is noted within the right colon from the recently administered contrast. 24 hour follow-up film is recommended. Electronically Signed   By: Alcide Clever M.D.   On: 02/13/2024 00:14   DG Abd Portable 1V Result Date: 02/12/2024 CLINICAL DATA:  Nausea, vomiting. EXAM: PORTABLE ABDOMEN - 1 VIEW COMPARISON:  February 08, 2024. FINDINGS: No abnormal bowel dilatation is noted. Moderate amount of stool seen throughout colon. Common bile duct stents are noted. Oral contrast is seen in the sigmoid colon and rectum. IMPRESSION: No abnormal bowel dilatation.  Moderate stool burden. Electronically Signed   By: Lupita Raider M.D.   On: 02/12/2024 15:00       Assessment / Plan:    #87 89 year old white female day #10 hospitalization admitted with abdominal pain and nausea Unclear whether this has been small bowel obstruction versus ileus, and managed conservatively thus far  Patient had not passed any contrast into her colon as of yesterday She is complaining of worsening abdominal pain, NG placed yesterday about 1 L out  WBC up to 29,000 today  No flatus or bowel movement  Exam concerning for worsening small bowel obstruction, component of ischemia?  #2 nutrition-significant oral intake over the past 9 days #3 history of recurrent choledocholithiasis, biliary stent in place  history of chronic calcific pancreatitis/pancreatic insufficiency #4 sleep apnea 5.  Diabetes mellitus  Plan; will discuss with surgery, think she needs to go to the OR We can reCT without contrast this a.m. Start IV antibiotics needs get PICC line placed and start TPN  Patient's daughter feels that patient  would want continued aggressive management.     Principal Problem:   SBO (small bowel obstruction) (HCC) Active Problems:   Pancreatic mass   Gastroesophageal reflux disease with esophagitis without hemorrhage   Hypertension   Uncontrolled type 2 diabetes mellitus with hyperglycemia, without long-term current use of insulin (HCC)   Hyperlipidemia   OSA (obstructive sleep apnea)   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)   Type 2 diabetes mellitus with diabetic polyneuropathy, without long-term current use of insulin (HCC)   Chronic pain disorder   Nausea and vomiting   Abdominal pain, chronic, right lower quadrant   Ileus (HCC)  Amy EsterwoodPA-C   LOS: 10 days  02/14/2024, 8:45 AM  GI ATTENDING  Agree with above assessment and plans. Abdominal process has worsened. Concerned about SBO with ischemia. Agree with CT and ongoing close surgical involvement.  Discussed with daughter.  She inquires about nutrition.  Wilhemina Bonito. Eda Keys., M.D. [facility] of Gastroenterology  ADDENDUM: CT scan reviewed. Small bowel demonstrates ileus pattern versus early partial small bowel obstruction.  Better than previous.  No new acute problems.  Biliary stents have moved but are still functioning well with normal liver tests.  PICC line and nutrition to be ordered.  GI team will continue to follow with internal medicine and surgery.  Surgery aware of CT.

## 2024-02-14 NOTE — Plan of Care (Signed)
  Problem: Education: Goal: Knowledge of General Education information will improve Description: Including pain rating scale, medication(s)/side effects and non-pharmacologic comfort measures 02/14/2024 0326 by Donia Pounds, RN Outcome: Progressing 02/14/2024 0325 by Donia Pounds, RN Outcome: Not Progressing   Problem: Health Behavior/Discharge Planning: Goal: Ability to manage health-related needs will improve 02/14/2024 0326 by Donia Pounds, RN Outcome: Progressing 02/14/2024 0325 by Donia Pounds, RN Outcome: Not Progressing   Problem: Clinical Measurements: Goal: Ability to maintain clinical measurements within normal limits will improve 02/14/2024 0326 by Donia Pounds, RN Outcome: Progressing 02/14/2024 0325 by Donia Pounds, RN Outcome: Not Progressing Goal: Will remain free from infection 02/14/2024 0326 by Donia Pounds, RN Outcome: Progressing 02/14/2024 0325 by Donia Pounds, RN Outcome: Not Progressing Goal: Diagnostic test results will improve 02/14/2024 0326 by Donia Pounds, RN Outcome: Progressing 02/14/2024 0325 by Donia Pounds, RN Outcome: Not Progressing Goal: Respiratory complications will improve 02/14/2024 0326 by Donia Pounds, RN Outcome: Progressing 02/14/2024 0325 by Donia Pounds, RN Outcome: Not Progressing Goal: Cardiovascular complication will be avoided 02/14/2024 0326 by Donia Pounds, RN Outcome: Progressing 02/14/2024 0325 by Donia Pounds, RN Outcome: Not Progressing   Problem: Activity: Goal: Risk for activity intolerance will decrease 02/14/2024 0326 by Donia Pounds, RN Outcome: Progressing 02/14/2024 0325 by Donia Pounds, RN Outcome: Not Progressing

## 2024-02-14 NOTE — Progress Notes (Signed)
 Pt lying in bed asleep.  Easily arousable.  Abd soft.  TTP around stimulator device.  NG functioning with ~547ml in canister.  Vitals stable.  Ct reviewed with pt's daughter.  No acute surgical needs identified at this time.  Will continue infectious workup and re-evaluate in AM  Vanita Panda, MD  Colorectal and General Surgery [facility]

## 2024-02-14 NOTE — Progress Notes (Signed)
 PROGRESS NOTE  Sherry Holloway ZOX:[id number] DOB: 1928/02/12   PCP: Donato Schultz, DO  Patient is from: Home.  DOA: 02/04/2024 LOS: 10  Chief complaints Chief Complaint  Patient presents with   Abdominal Pain     Brief Narrative / Interim history: 89 year old F with PMH of esophageal cancer, chronic calcific pancreatitis, pancreatic insufficiency, biliary stones s/p biliary stents in 10/2023, HTN, HLD, GERD, DM-2 and OSA presented to ED with intermittent RUQ pain and unintentional weight loss since mid January and admitted with working diagnosis of small bowel obstruction.  She was managed conservatively with NG tube and general surgery guidance.  She had a bowel movements and NG tube was removed.  However, she had abdominal distention again with significant leukocytosis.  CT abdomen and pelvis showed abdominal distention with dilated bowels.  GI consulted and general surgery reengaged.  SBO protocol on 2/27 and 2/28 with slow progression of oral contrast within multiple dilated small bowel loops consistent with ileus or partial obstruction.  She has significantly rising leukocytosis with persistent abdominal distention and tenderness.  GI and general surgery following.  CT abdomen and pelvis ordered.  Subjective: Seen and examined earlier this morning.  No major events overnight of this morning.  She had about 950 cc from NG tube overnight.  She is sleepy but wakes to voice.  Continues to endorse abdominal pain, mainly in RLQ.  No bowel movement or flatus.  NG tube was placed yesterday.  WBC trended up to 30k.   Objective: Vitals:   02/13/24 1437 02/13/24 1717 02/13/24 1935 02/14/24 0437  BP: (!) 187/60 (!) 183/62 (!) 160/54 (!) 169/51  Pulse: 88 84 95 82  Resp: 16  18 18   Temp: 97.9 F (36.6 C)  98 F (36.7 C) 97.9 F (36.6 C)  TempSrc: Oral  Oral Oral  SpO2: 95%  93% 93%  Weight:      Height:        Examination:  GENERAL: No apparent distress.  Nontoxic. HEENT:  MMM.  Vision and hearing grossly intact.  NGT in place. NECK: Supple.  No apparent JVD.  RESP:  No IWOB.  Fair aeration bilaterally. CVS:  RRR. Heart sounds normal.  ABD/GI/GU: BS diminished.  Tenderness out of proportion. MSK/EXT:  Moves extremities. No apparent deformity. No edema.  SKIN: no apparent skin lesion or wound NEURO: Awake, alert and oriented appropriately.  No apparent focal neuro deficit. PSYCH: Calm. Normal affect.   Procedures:  None  Microbiology summarized: 2/24-C. difficile negative.  Assessment and plan: Small bowel obstruction: Initial CT on 2/19 showed SBO with transition point in right anatomic pelvis.  This was treated conservatively with NG tube and bowel rest and resolved.  SBO protocol on 2/21 showed contrast in colon.  However, she felt worse again.  CTA on 2/25 with diffuse dilation of small bowel but decreased caliber and contrast in colon.  SBO protocol on 2/27 with no contrast in right colon.  Patient has progressive distention and leukocytosis.  She has tenderness out of proportion on exam. -GI and general surgery on board. -Check CT abdomen and pelvis and lactic acid. -Continue IV Dilaudid 0.5 mg every 3 hours for moderate and severe pain -N.p.o. except ice chips.  Discontinue oral meds. -Continue IV fluid -I agree with empiric IV Zosyn and TPN. -Antiemetics and PPI. -Mobilize patient, PT/OT  Chronic calcific pancreatitis/chronic biliary obstruction/pancreatic insufficiency -S/p multiple ERCPs.  -Continue home Creon when she starts taking p.o. -GI on board  Essential hypertension:  SBP slightly elevated with DBP in 50s. -Pain control as above -IV hydralazine as needed  NIDDM-2 with hyperglycemia: A1c 7.3% in 10/2023 Recent Labs  Lab 02/13/24 1712 02/13/24 2106 02/14/24 0755  GLUCAP 178* 158* 167*  -Start SSI-very sensitive every 4 hours while NPO -Recheck hemoglobin A1c -Hold Jardiance  Hypokalemia -Monitor replenish as  appropriate  Hyponatremia: Mild.  Resolved. -IV fluid as above   Right subclavian stenosis: Chronic. Daughter reported permissive hypertension recommended by PCP due to this but no documentation -Outpatient follow-up. -Check blood pressure on left arm.   GERD -Continue PPI   Hyperlipidemia -Hold statin.   Chronic pain under -Pain management as above   History of PE, on Xarelto outpatient -Hold anticoagulation in case she needs surgery   Leukocytosis: Getting worse.  Likely due to #1. -IV antibiotics as above -Continue monitoring  Body mass index is 22.74 kg/m.     DVT prophylaxis:  Place and maintain sequential compression device Start: 02/05/24 1041  Code Status: DNR/DNI Family Communication: None at bedside Level of care: Med-Surg Status is: Inpatient Remains inpatient appropriate because: Small bowel distention/obstruction/ileus   Final disposition: To be determined Consultants:  General surgery Gastroenterology  55 minutes with more than 50% spent in reviewing records, counseling patient/family and coordinating care.   Sch Meds:  Scheduled Meds:  amLODipine  5 mg Oral Daily   bisacodyl  10 mg Rectal Daily   feeding supplement  1 Container Oral TID BM   insulin aspart  0-6 Units Subcutaneous TID WC   irbesartan  150 mg Oral Daily   latanoprost  1 drop Both Eyes QHS   lipase/protease/amylase  36,000 Units Oral BID AC   mirtazapine  15 mg Oral QHS   pantoprazole (PROTONIX) IV  40 mg Intravenous QHS   sucralfate  1 g Oral BID   Continuous Infusions:  lactated ringers     lactated ringers 75 mL/hr at 02/14/24 0344   piperacillin-tazobactam (ZOSYN)  IV 3.375 g (02/14/24 0951)   PRN Meds:.acetaminophen, acetaminophen, alum & mag hydroxide-simeth, hydrALAZINE, HYDROmorphone (DILAUDID) injection, iohexol, lactated ringers, magic mouthwash, menthol-cetylpyridinium, naphazoline-glycerin, ondansetron (ZOFRAN) IV, mouth rinse, phenol, prochlorperazine,  simethicone, sodium chloride  Antimicrobials: Anti-infectives (From admission, onward)    Start     Dose/Rate Route Frequency Ordered Stop   02/14/24 1000  piperacillin-tazobactam (ZOSYN) IVPB 3.375 g        3.375 g 12.5 mL/hr over 240 Minutes Intravenous Every 8 hours 02/14/24 0912          I have personally reviewed the following labs and images: CBC: Recent Labs  Lab 02/09/24 0425 02/11/24 0442 02/12/24 0434 02/13/24 1813 02/14/24 0522  WBC 9.3 19.0* 20.1* 30.6* 29.9*  NEUTROABS  --   --   --   --  27.3*  HGB 11.3* 11.7* 12.7 13.3 11.1*  HCT 35.5* 35.3* 39.3 44.4 35.9*  MCV 85.7 84.2 85.2 89.0 87.8  PLT 229 259 278 356 317   BMP &GFR Recent Labs  Lab 02/09/24 0425 02/11/24 0442 02/12/24 0434 02/13/24 0847 02/14/24 0522  NA 134* 130* 130* 130* 137  K 3.8 3.1* 3.3* 3.9 3.7  CL 100 96* 98 100 103  CO2 25 24 22  20* 23  GLUCOSE 127* 158* 173* 191* 176*  BUN 10 12 12 23  27*  CREATININE 0.56 0.32* 0.54 0.53 0.54  CALCIUM 8.7* 8.8* 9.3 9.5 9.5  MG  --   --  2.0 2.1 1.9  PHOS  --   --  2.9  --  2.5   Estimated Creatinine Clearance: 37.1 mL/min (by C-G formula based on SCr of 0.54 mg/dL). Liver & Pancreas: Recent Labs  Lab 02/11/24 0442 02/12/24 0434 02/13/24 0847 02/14/24 0522  AST 8* 9* 8* 10*  ALT 15 16 16 15   ALKPHOS 62 74 86 80  BILITOT 0.5 0.5 0.6 0.8  PROT 5.3* 5.6* 5.9* 5.4*  ALBUMIN 2.4* 2.5* 2.5* 2.1*   No results for input(s): "LIPASE", "AMYLASE" in the last 168 hours. No results for input(s): "AMMONIA" in the last 168 hours. Diabetic: No results for input(s): "HGBA1C" in the last 72 hours. Recent Labs  Lab 02/13/24 1712 02/13/24 2106 02/14/24 0755  GLUCAP 178* 158* 167*   Cardiac Enzymes: No results for input(s): "CKTOTAL", "CKMB", "CKMBINDEX", "TROPONINI" in the last 168 hours. Recent Labs    08/04/23 1422  PROBNP 251.0*   Coagulation Profile: No results for input(s): "INR", "PROTIME" in the last 168 hours. Thyroid Function  Tests: No results for input(s): "TSH", "T4TOTAL", "FREET4", "T3FREE", "THYROIDAB" in the last 72 hours. Lipid Profile: No results for input(s): "CHOL", "HDL", "LDLCALC", "TRIG", "CHOLHDL", "LDLDIRECT" in the last 72 hours. Anemia Panel: No results for input(s): "VITAMINB12", "FOLATE", "FERRITIN", "TIBC", "IRON", "RETICCTPCT" in the last 72 hours. Urine analysis:    Component Value Date/Time   COLORURINE YELLOW 02/04/2024 1506   APPEARANCEUR CLEAR 02/04/2024 1506   LABSPEC 1.015 02/04/2024 1506   PHURINE 5.5 02/04/2024 1506   GLUCOSEU >=500 (A) 02/04/2024 1506   HGBUR NEGATIVE 02/04/2024 1506   BILIRUBINUR NEGATIVE 02/04/2024 1506   BILIRUBINUR negative 08/04/2023 1456   KETONESUR 40 (A) 02/04/2024 1506   PROTEINUR 100 (A) 02/04/2024 1506   UROBILINOGEN 0.2 08/04/2023 1456   NITRITE NEGATIVE 02/04/2024 1506   LEUKOCYTESUR NEGATIVE 02/04/2024 1506   Sepsis Labs: Invalid input(s): "PROCALCITONIN", "LACTICIDVEN"  Microbiology: Recent Results (from the past 240 hours)  C Difficile Quick Screen (NO PCR Reflex)     Status: None   Collection Time: 02/09/24  1:48 PM   Specimen: STOOL  Result Value Ref Range Status   C Diff antigen NEGATIVE NEGATIVE Final   C Diff toxin NEGATIVE NEGATIVE Final   C Diff interpretation No C. difficile detected.  Final    Comment: Performed at [facility], [address] [place], [place] [address]    Radiology Studies: DG Abd Portable 1V Result Date: 02/13/2024 CLINICAL DATA:  NG tube placement EXAM: PORTABLE ABDOMEN - 1 VIEW COMPARISON:  Earlier today at 7:57 a.m. FINDINGS: Nasogastric tube terminates at the gastric body with side port well below the gastroesophageal junction. Dorsal spinal stimulator. Common duct stent. Cholecystectomy. No gross free intraperitoneal air. Prominent gas-filled loops of upper abdominal small bowel are relatively similar. Contrast remains within the stomach. IMPRESSION: Nasogastric tube appropriately  positioned. Electronically Signed   By: Jeronimo Greaves M.D.   On: 02/13/2024 19:49      Mahlani Berninger T. Stefanee Mckell [facility]  If 7PM-7AM, please contact night-coverage [email or web address] 02/14/2024, 10:58 AM

## 2024-02-14 NOTE — Progress Notes (Signed)
 02/14/2024  Sherry Holloway [id number] [birth year]  CARE TEAM: PCP: Donato Schultz, DO  Outpatient Care Team: Patient Care Team: Zola Button, Grayling Congress, DO as PCP - General (Family Medicine) Croitoru, Rachelle Hora, MD as PCP - Cardiology (Cardiology) Sheran Luz, MD as Consulting Physician (Physical Medicine and Rehabilitation) Iva Boop, MD as Consulting Physician (Gastroenterology) Ladene Artist, MD as Consulting Physician (Oncology) Radonna Ricker, RN (Inactive) as Oncology Nurse Navigator  Inpatient Treatment Team: Treatment Team:  Almon Hercules, MD Lawrence Santiago, MD Hilarie Fredrickson, MD Ccs, Md, MD Candiss Norse, RN Selinda Flavin, OT Ezzard Standing, NT Bess Kinds, RN Mahabir, Olegario Messier, RN   Problem List:   Principal Problem:   SBO (small bowel obstruction) [facility]) Active Problems:   Pancreatic mass   Gastroesophageal reflux disease with esophagitis without hemorrhage   Hypertension   Uncontrolled type 2 diabetes mellitus with hyperglycemia, without long-term current use of insulin (HCC)   Hyperlipidemia   OSA (obstructive sleep apnea)   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)   Type 2 diabetes mellitus with diabetic polyneuropathy, without long-term current use of insulin (HCC)   Chronic pain disorder   Nausea and vomiting   Abdominal pain, chronic, right lower quadrant   Ileus (HCC)   * No surgery found *      Assessment [facility] Stay = 10 days)      Nausea vomiting abdominal pain with question of bowel obstruction initially improved but now worse Plan:  SBO vs ileus in setting of Chronic constipation  -  CT scan of the abdomen and pelvis 2/25 showed small bowel dilation with question of  transition point in RLQ.  Contrast getting to transverse colon arguing against complete obstruction.  No pneumoperitoneum or signs of bowel ischemia on CT. Visible biliary stents noted in place from her history of choledocholithiasis 10/2023.  Gallbladder surgically absent.   - SBO protocol 2/21 - contrast passed into colon.  Initially had okay but felt worse.  NG tube back in yesterday.  Low output but seems rather thick concerning for chronic obstruction or at least significant ileus.  Not much relief with enema.  Moderately distended and uncomfortable especially right lower quadrant.  Agree with repeating CAT scan this morning.  If it shows concern for persistent obstruction, ischemia or perforation; then, most likely operative exploration.  I updated the patient's status to the patient especially her daughter at bedside.  Recommendations were made.  Questions were answered.  They expressed understanding & appreciation.   - NPO except ice chips and sips  - mobility as tolerated  - Keep K>4.0 and Mg >2.0    FEN: NG tube.  IV fluid.  Agree with starting TPN for malnutrition.   VTE: CDs.  No Xarelto for now. ID: no current abx   - per [facility] -  HTN HLD OSA Hx if uterine cancer Hx of esophageal cancer GERD T2DM  LOS: 9 days       I reviewed nursing notes, hospitalist notes, last 24 h vitals and pain scores, last 48 h intake and output, last 24 h labs and trends, and last 24 h imaging results.  I have reviewed this patient's available data, including medical history, events of note, test results, etc as part of my evaluation.   A significant portion of that time was spent in counseling. Care during the described time interval was provided by me.  This care required high  level of medical decision making.  02/14/2024    Subjective: (Chief complaint)  Patient feeling worse.  NG tube replaced.  Still sore and uncomfortable.  Daughter at bedside.  Objective:  Vital signs:  Vitals:   02/13/24 1437 02/13/24 1717 02/13/24 1935 02/14/24 0437  BP: (!) 187/60 (!) 183/62 (!) 160/54 (!) 169/51  Pulse: 88 84 95 82  Resp: 16  18 18   Temp: 97.9 F (36.6 C)  98 F (36.7 C) 97.9 F (36.6 C)  TempSrc: Oral   Oral Oral  SpO2: 95%  93% 93%  Weight:      Height:        Last BM Date : 02/11/24  Intake/Output   Yesterday:  02/28 0701 - 03/01 0700 In: 876.6 [I.V.:876.6] Out: 1750 [Urine:800; Emesis/NG output:950] This shift:  No intake/output data recorded.  Bowel function:  Flatus: No  BM:  No  Drain: NG tube with thick green effluent   Physical Exam:  General: Pt awake/alert in mild acute distress.  Withdrawn and not particularly interactive today Eyes: PERRL, normal EOM.  Sclera clear.  No icterus Neuro: CN II-XII intact w/o focal sensory/motor deficits. Lymph: No head/neck/groin lymphadenopathy Psych:  No delerium/psychosis/paranoia.  Oriented x 4 HENT: Normocephalic, Mucus membranes moist.  No thrush Neck: Supple, No tracheal deviation.  No obvious thyromegaly Chest: No pain to chest wall compression.  Good respiratory excursion.  No audible wheezing CV:  Pulses intact.  Regular rhythm.  No major extremity edema MS: Normal AROM mjr joints.  No obvious deformity  Abdomen: Soft.  Moderately distended.  Tenderness in right lower abdomen.  Left abdomen nontender..  No evidence of peritonitis.  No incarcerated hernias.  Ext:   No deformity.  No mjr edema.  No cyanosis Skin: No petechiae / purpurea.  No major sores.  Warm and dry    Results:   Cultures: Recent Results (from the past 720 hours)  C Difficile Quick Screen (NO PCR Reflex)     Status: None   Collection Time: 02/09/24  1:48 PM   Specimen: STOOL  Result Value Ref Range Status   C Diff antigen NEGATIVE NEGATIVE Final   C Diff toxin NEGATIVE NEGATIVE Final   C Diff interpretation No C. difficile detected.  Final    Comment: Performed at [facility], [address], [place], [place] [address]    Labs: Results for orders placed or performed during the hospital encounter of 02/04/24 (from the past 48 hours)  Comprehensive metabolic panel     Status: Abnormal   Collection Time: 02/13/24  8:47  AM  Result Value Ref Range   Sodium 130 (L) 135 - 145 mmol/L   Potassium 3.9 3.5 - 5.1 mmol/L   Chloride 100 98 - 111 mmol/L   CO2 20 (L) 22 - 32 mmol/L   Glucose, Bld 191 (H) 70 - 99 mg/dL    Comment: Glucose reference range applies only to samples taken after fasting for at least 8 hours.   BUN 23 8 - 23 mg/dL   Creatinine, Ser 4.78 0.44 - 1.00 mg/dL   Calcium 9.5 8.9 - 29.5 mg/dL   Total Protein 5.9 (L) 6.5 - 8.1 g/dL   Albumin 2.5 (L) 3.5 - 5.0 g/dL   AST 8 (L) 15 - 41 U/L   ALT 16 0 - 44 U/L   Alkaline Phosphatase 86 38 - 126 U/L   Total Bilirubin 0.6 0.0 - 1.2 mg/dL   GFR, Estimated >62 >13 mL/min    Comment: (NOTE) Calculated using the  CKD-EPI Creatinine Equation (2021)    Anion gap 10 5 - 15    Comment: Performed at [facility], [address], [place], [place] [address]  Magnesium     Status: None   Collection Time: 02/13/24  8:47 AM  Result Value Ref Range   Magnesium 2.1 1.7 - 2.4 mg/dL    Comment: Performed at [facility], [address] [place], [place] [address]  Glucose, capillary     Status: Abnormal   Collection Time: 02/13/24  5:12 PM  Result Value Ref Range   Glucose-Capillary 178 (H) 70 - 99 mg/dL    Comment: Glucose reference range applies only to samples taken after fasting for at least 8 hours.  CBC     Status: Abnormal   Collection Time: 02/13/24  6:13 PM  Result Value Ref Range   WBC 30.6 (H) 4.0 - 10.5 K/uL   RBC 4.99 3.87 - 5.11 MIL/uL   Hemoglobin 13.3 12.0 - 15.0 g/dL   HCT 09.8 11.9 - 14.7 %   MCV 89.0 80.0 - 100.0 fL   MCH 26.7 26.0 - 34.0 pg   MCHC 30.0 30.0 - 36.0 g/dL   RDW 82.9 56.2 - 13.0 %   Platelets 356 150 - 400 K/uL   nRBC 0.0 0.0 - 0.2 %    Comment: Performed at [facility], [address], [place], [place] [address]  Glucose, capillary     Status: Abnormal   Collection Time: 02/13/24  9:06 PM  Result Value Ref Range   Glucose-Capillary 158 (H) 70 - 99 mg/dL     Comment: Glucose reference range applies only to samples taken after fasting for at least 8 hours.  Comprehensive metabolic panel     Status: Abnormal   Collection Time: 02/14/24  5:22 AM  Result Value Ref Range   Sodium 137 135 - 145 mmol/L    Comment: DELTA CHECK NOTED   Potassium 3.7 3.5 - 5.1 mmol/L   Chloride 103 98 - 111 mmol/L   CO2 23 22 - 32 mmol/L   Glucose, Bld 176 (H) 70 - 99 mg/dL    Comment: Glucose reference range applies only to samples taken after fasting for at least 8 hours.   BUN 27 (H) 8 - 23 mg/dL   Creatinine, Ser 4.69 0.44 - 1.00 mg/dL   Calcium 9.5 8.9 - 62.9 mg/dL   Total Protein 5.4 (L) 6.5 - 8.1 g/dL   Albumin 2.1 (L) 3.5 - 5.0 g/dL   AST 10 (L) 15 - 41 U/L   ALT 15 0 - 44 U/L   Alkaline Phosphatase 80 38 - 126 U/L   Total Bilirubin 0.8 0.0 - 1.2 mg/dL   GFR, Estimated >52 >84 mL/min    Comment: (NOTE) Calculated using the CKD-EPI Creatinine Equation (2021)    Anion gap 11 5 - 15    Comment: Performed at [facility], [address] [place], [place] [address]  CBC with Differential/Platelet     Status: Abnormal   Collection Time: 02/14/24  5:22 AM  Result Value Ref Range   WBC 29.9 (H) 4.0 - 10.5 K/uL   RBC 4.09 3.87 - 5.11 MIL/uL   Hemoglobin 11.1 (L) 12.0 - 15.0 g/dL   HCT 01.0 (L) 27.2 - 53.6 %   MCV 87.8 80.0 - 100.0 fL   MCH 27.1 26.0 - 34.0 pg   MCHC 30.9 30.0 - 36.0 g/dL   RDW 64.4 03.4 -  15.5 %   Platelets 317 150 - 400 K/uL   nRBC 0.0 0.0 - 0.2 %   Neutrophils Relative % 91 %   Neutro Abs 27.3 (H) 1.7 - 7.7 K/uL   Lymphocytes Relative 3 %   Lymphs Abs 0.8 0.7 - 4.0 K/uL   Monocytes Relative 5 %   Monocytes Absolute 1.5 (H) 0.1 - 1.0 K/uL   Eosinophils Relative 0 %   Eosinophils Absolute 0.0 0.0 - 0.5 K/uL   Basophils Relative 0 %   Basophils Absolute 0.1 0.0 - 0.1 K/uL   WBC Morphology MORPHOLOGY UNREMARKABLE    RBC Morphology MORPHOLOGY UNREMARKABLE    Smear Review Normal platelet morphology    Immature  Granulocytes 1 %   Abs Immature Granulocytes 0.21 (H) 0.00 - 0.07 K/uL    Comment: Performed at [facility], [address] [place], [place] [address]  Phosphorus     Status: None   Collection Time: 02/14/24  5:22 AM  Result Value Ref Range   Phosphorus 2.5 2.5 - 4.6 mg/dL    Comment: Performed at [facility], [address] [place], [place] [address]  Magnesium     Status: None   Collection Time: 02/14/24  5:22 AM  Result Value Ref Range   Magnesium 1.9 1.7 - 2.4 mg/dL    Comment: Performed at [facility], [address] [place], [place] [address]  Glucose, capillary     Status: Abnormal   Collection Time: 02/14/24  7:55 AM  Result Value Ref Range   Glucose-Capillary 167 (H) 70 - 99 mg/dL    Comment: Glucose reference range applies only to samples taken after fasting for at least 8 hours.    Imaging / Studies: DG Abd Portable 1V Result Date: 02/13/2024 CLINICAL DATA:  NG tube placement EXAM: PORTABLE ABDOMEN - 1 VIEW COMPARISON:  Earlier today at 7:57 a.m. FINDINGS: Nasogastric tube terminates at the gastric body with side port well below the gastroesophageal junction. Dorsal spinal stimulator. Common duct stent. Cholecystectomy. No Audie Stayer free intraperitoneal air. Prominent gas-filled loops of upper abdominal small bowel are relatively similar. Contrast remains within the stomach. IMPRESSION: Nasogastric tube appropriately positioned. Electronically Signed   By: Jeronimo Greaves M.D.   On: 02/13/2024 19:49   DG Abd Portable 1V Result Date: 02/13/2024 CLINICAL DATA:  Small bowel obstruction. EXAM: PORTABLE ABDOMEN - 1 VIEW COMPARISON:  Abdominal x-ray from yesterday. FINDINGS: Slight progression of oral contrast within multiple dilated small bowel loops, specifically in the right abdomen. Considerable amount of contrast remains in the gastric fundus. No significant interval change in oral contrast and prominent stool in the left  colon. No definite oral contrast in the right colon. IMPRESSION: 1. Slow progression of oral contrast within multiple dilated small bowel loops, consistent with ileus or partial obstruction. Electronically Signed   By: Obie Dredge M.D.   On: 02/13/2024 12:06   DG Abd Portable 1V-Small Bowel Obstruction Protocol-initial, 8 hr delay Result Date: 02/13/2024 CLINICAL DATA:  8 hour small-bowel follow-through EXAM: PORTABLE ABDOMEN - 1 VIEW COMPARISON:  Film from earlier in the same day. FINDINGS: Contrast is noted within the stomach. Scattered contrast is noted throughout the small bowel. Previously seen contrast in the colon is again identified. No right sided colonic contrast is seen to suggest adequate passage. Biliary stent and stimulator wires are seen. No free air is noted. IMPRESSION: No contrast is noted within the right colon from the recently administered contrast. 24 hour follow-up  film is recommended. Electronically Signed   By: Alcide Clever M.D.   On: 02/13/2024 00:14   DG Abd Portable 1V Result Date: 02/12/2024 CLINICAL DATA:  Nausea, vomiting. EXAM: PORTABLE ABDOMEN - 1 VIEW COMPARISON:  February 08, 2024. FINDINGS: No abnormal bowel dilatation is noted. Moderate amount of stool seen throughout colon. Common bile duct stents are noted. Oral contrast is seen in the sigmoid colon and rectum. IMPRESSION: No abnormal bowel dilatation.  Moderate stool burden. Electronically Signed   By: Lupita Raider M.D.   On: 02/12/2024 15:00    Medications / Allergies: per chart  Antibiotics: Anti-infectives (From admission, onward)    None         Note: Portions of this report may have been transcribed using voice recognition software. Every effort was made to ensure accuracy; however, inadvertent computerized transcription errors may be present.   Any transcriptional errors that result from this process are unintentional.    Ardeth Sportsman, MD, FACS, MASCRS Esophageal, Gastrointestinal &  Colorectal Surgery Robotic and Minimally Invasive Surgery  [facility] [address] [place], [place] [address] [phone] Fax [phone] Main  CONTACT INFORMATION: Weekday (9AM-5PM): Call [facility] main office at [phone] Weeknight (5PM-9AM) or Weekend/Holiday: Check EPIC "Web Links" tab & use "AMION" (password " [email or web address]") for General Surgery [facility] coverage  Please, DO NOT use SecureChat  (it is not reliable communication to reach operating surgeons & will lead to a delay in care).   Epic staff messaging available for outptient concerns needing 1-2 business day response.      02/14/2024  8:33 AM

## 2024-02-14 NOTE — Progress Notes (Addendum)
 NG tube advanced 10cm per order, still no residual/gastric contents, tube aspirated and attempted to flush, met with resistance.   NG tube now removed.  Medical MD contacted to advice for next step in POC.   This RN reached out to rapid response nurse to attempt 3rd NG tube per MD verbal order.

## 2024-02-15 ENCOUNTER — Inpatient Hospital Stay (HOSPITAL_COMMUNITY)

## 2024-02-15 DIAGNOSIS — K567 Ileus, unspecified: Secondary | ICD-10-CM | POA: Diagnosis not present

## 2024-02-15 DIAGNOSIS — D72829 Elevated white blood cell count, unspecified: Secondary | ICD-10-CM | POA: Diagnosis not present

## 2024-02-15 DIAGNOSIS — K9289 Other specified diseases of the digestive system: Secondary | ICD-10-CM | POA: Insufficient documentation

## 2024-02-15 DIAGNOSIS — R1031 Right lower quadrant pain: Secondary | ICD-10-CM | POA: Diagnosis not present

## 2024-02-15 DIAGNOSIS — K56609 Unspecified intestinal obstruction, unspecified as to partial versus complete obstruction: Secondary | ICD-10-CM | POA: Diagnosis not present

## 2024-02-15 DIAGNOSIS — E1142 Type 2 diabetes mellitus with diabetic polyneuropathy: Secondary | ICD-10-CM | POA: Diagnosis not present

## 2024-02-15 DIAGNOSIS — K831 Obstruction of bile duct: Secondary | ICD-10-CM

## 2024-02-15 DIAGNOSIS — K5909 Other constipation: Secondary | ICD-10-CM | POA: Insufficient documentation

## 2024-02-15 DIAGNOSIS — E1165 Type 2 diabetes mellitus with hyperglycemia: Secondary | ICD-10-CM | POA: Diagnosis not present

## 2024-02-15 LAB — CBC
HCT: 33.9 % — ABNORMAL LOW (ref 36.0–46.0)
Hemoglobin: 10.6 g/dL — ABNORMAL LOW (ref 12.0–15.0)
MCH: 27.1 pg (ref 26.0–34.0)
MCHC: 31.3 g/dL (ref 30.0–36.0)
MCV: 86.7 fL (ref 80.0–100.0)
Platelets: 324 10*3/uL (ref 150–400)
RBC: 3.91 MIL/uL (ref 3.87–5.11)
RDW: 14.8 % (ref 11.5–15.5)
WBC: 29.3 10*3/uL — ABNORMAL HIGH (ref 4.0–10.5)
nRBC: 0 % (ref 0.0–0.2)

## 2024-02-15 LAB — GLUCOSE, CAPILLARY
Glucose-Capillary: 100 mg/dL — ABNORMAL HIGH (ref 70–99)
Glucose-Capillary: 143 mg/dL — ABNORMAL HIGH (ref 70–99)
Glucose-Capillary: 150 mg/dL — ABNORMAL HIGH (ref 70–99)
Glucose-Capillary: 133 mg/dL — ABNORMAL HIGH (ref 70–99)
Glucose-Capillary: 196 mg/dL — ABNORMAL HIGH (ref 70–99)

## 2024-02-15 LAB — COMPREHENSIVE METABOLIC PANEL
ALT: 15 U/L (ref 0–44)
AST: 10 U/L — ABNORMAL LOW (ref 15–41)
Albumin: 2 g/dL — ABNORMAL LOW (ref 3.5–5.0)
Alkaline Phosphatase: 80 U/L (ref 38–126)
Anion gap: 13 (ref 5–15)
BUN: 27 mg/dL — ABNORMAL HIGH (ref 8–23)
CO2: 22 mmol/L (ref 22–32)
Calcium: 9.5 mg/dL (ref 8.9–10.3)
Chloride: 100 mmol/L (ref 98–111)
Creatinine, Ser: 0.49 mg/dL (ref 0.44–1.00)
GFR, Estimated: >60 mL/min (ref >60)
Glucose, Bld: 106 mg/dL — ABNORMAL HIGH (ref 70–99)
Potassium: 3.1 mmol/L — ABNORMAL LOW (ref 3.5–5.1)
Sodium: 135 mmol/L (ref 135–145)
Total Bilirubin: 0.8 mg/dL (ref 0.0–1.2)
Total Protein: 5.2 g/dL — ABNORMAL LOW (ref 6.5–8.1)

## 2024-02-15 LAB — PHOSPHORUS: Phosphorus: 2.6 mg/dL (ref 2.5–4.6)

## 2024-02-15 LAB — MAGNESIUM: Magnesium: 1.9 mg/dL (ref 1.7–2.4)

## 2024-02-15 MED ORDER — MAGNESIUM SULFATE 2 GM/50ML IV SOLN
2.0000 g | Freq: Once | INTRAVENOUS | Status: AC
  Administered 2024-02-15: 2 g via INTRAVENOUS
  Filled 2024-02-15: qty 50

## 2024-02-15 MED ORDER — TRAVASOL 10 % IV SOLN
INTRAVENOUS | Status: AC
  Filled 2024-02-15: qty 480

## 2024-02-15 MED ORDER — POTASSIUM CHLORIDE 2 MEQ/ML IV SOLN
INTRAVENOUS | Status: AC
  Filled 2024-02-15 (×2): qty 1000

## 2024-02-15 MED ORDER — CHLORHEXIDINE GLUCONATE CLOTH 2 % EX PADS
6.0000 | MEDICATED_PAD | Freq: Every day | CUTANEOUS | Status: DC
  Administered 2024-02-15 – 2024-02-20 (×6): 6 via TOPICAL

## 2024-02-15 MED ORDER — POTASSIUM CHLORIDE 10 MEQ/100ML IV SOLN
10.0000 meq | INTRAVENOUS | Status: AC
Start: 2024-02-15 — End: 2024-02-15
  Administered 2024-02-15 (×4): 10 meq via INTRAVENOUS
  Filled 2024-02-15 (×4): qty 100

## 2024-02-15 MED ORDER — SODIUM CHLORIDE 0.9% FLUSH: 10.0000 mL | INTRAVENOUS | Status: DC | PRN

## 2024-02-15 NOTE — Progress Notes (Signed)
 Progress Note    ASSESSMENT AND PLAN:   Ileus vs PSBO Leukocytosis. Biliary obstruction s/p ERCP with stenting 10/29/2023 (Dr Thea Silversmith).  There may be partial migration but still functioning, normal LFTs.  Not a candidate for ERCP currently. Spinal stimulator R flank (daughter quite concerned about that)  Plan: -Continue supportive treatment- NG, TPN, IVF for now. -Keep K>4, Mg>2 -Minimize pain medications -Continue antibiotics for now. -Appreciate surgical consultation (Dr Michaell Cowing) -Dr. Meridee Score taking over the service tomorrow.   D/W pt's daughter who does know Mansouraty from previous GI procedures.  SUBJECTIVE   Per daughter patient does feel better. Having bowel movements. Has NG tube with bilious aspirate.  CT AP from yesterday showed improved small bowel distention.  No focal transition point.  Favoring ileus.  Still has pneumobilia.  All other stents may have migrated partially, LFTs are normal.    OBJECTIVE:     Vital signs in last 24 hours: Temp:  [98.1 F (36.7 C)-98.6 F (37 C)] 98.1 F (36.7 C) (03/02 1411) Pulse Rate:  [71-91] 71 (03/02 1411) Resp:  [15-17] 17 (03/02 1411) BP: (148-165)/(45-108) 148/108 (03/02 1411) SpO2:  [93 %-94 %] 93 % (03/02 1411) Last BM Date : 02/15/24 General: Patient was sleeping EENT: NG tube with bilious aspirate Heart:  Regular rate and rhythm; no murmur.  No lower extremity edema   Pulm: Normal respiratory effort, lungs CTA bilaterally without wheezes or crackles. Abdomen:  Soft, nondistended, mild right lower abdominal tenderness.  Very sluggish bowel sounds,.         Intake/Output from previous day: 03/01 0701 - 03/02 0700 In: 135.6 [P.O.:30; IV Piggyback:105.6] Out: 2930 [Urine:1880; Emesis/NG output:1050] Intake/Output this shift: Total I/O In: 98.9 [I.V.:3.8; IV Piggyback:95.1] Out: -   Lab Results: Recent Labs    02/13/24 1813 02/14/24 0522 02/15/24 0429  WBC 30.6* 29.9* 29.3*  HGB 13.3 11.1* 10.6*   HCT 44.4 35.9* 33.9*  PLT 356 317 324   BMET Recent Labs    02/13/24 0847 02/14/24 0522 02/15/24 0429  NA 130* 137 135  K 3.9 3.7 3.1*  CL 100 103 100  CO2 20* 23 22  GLUCOSE 191* 176* 106*  BUN 23 27* 27*  CREATININE 0.53 0.54 0.49  CALCIUM 9.5 9.5 9.5   LFT Recent Labs    02/15/24 0429  PROT 5.2*  ALBUMIN 2.0*  AST 10*  ALT 15  ALKPHOS 80  BILITOT 0.8   PT/INR No results for input(s): "LABPROT", "INR" in the last 72 hours. Hepatitis Panel No results for input(s): "HEPBSAG", "HCVAB", "HEPAIGM", "HEPBIGM" in the last 72 hours.  DG Abd 1 View Result Date: 02/14/2024 CLINICAL DATA:  Enteric tube placement. EXAM: ABDOMEN - 1 VIEW COMPARISON:  Same day abdominal radiograph. FINDINGS: An enteric tube terminates in the stomach. IMPRESSION: Enteric tube terminates in the stomach. Electronically Signed   By: Romona Curls M.D.   On: 02/14/2024 16:56   CT ABDOMEN PELVIS W CONTRAST Result Date: 02/14/2024 CLINICAL DATA:  Abdominal pain. Peritonitis or perforation suspected. Past medical history includes pancreatic mass. Esophageal cancer. 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 Biliary obstruction. * Tracking Code: BO * EXAM: CT ABDOMEN AND PELVIS WITH CONTRAST TECHNIQUE: Multidetector CT imaging of the abdomen and pelvis was performed using the standard protocol following bolus administration of intravenous contrast. RADIATION DOSE REDUCTION: This exam was performed according to the departmental dose-optimization program which includes automated exposure control, adjustment of the mA and/or kV according to patient size and/or use  of iterative reconstruction technique. CONTRAST:  30mL OMNIPAQUE IOHEXOL 300 MG/ML SOLN, OMNIPAQUE IOHEXOL 300 MG/ML SOLN COMPARISON:  02/10/2024 FINDINGS: Lower chest: Emphysema. Motion degradation at the lung bases. Bibasilar scarring. Normal heart size. Trace left pleural fluid. Multivessel coronary artery atherosclerosis.  Hepatobiliary: No suspicious liver lesion. Moderate pneumobilia within the left hepatic lobe. moderate intrahepatic biliary duct dilatation. 2 common duct stents are unchanged in position. These originate in the hepatic hilum and just above the pancreatic head. Terminate within the descending duodenum. On coronal reformats, the common duct is dilated at up to 1.6 cm above the proximal most stent (39/5). Especially when compared to the exam of 01/13/2024, the stents have been repositioned or migrated caudally today. Pancreas: Pancreatic marked parenchymal atrophy with moderate duct dilatation, similar at 8 mm. Followed to the level of the ampulla. residual cystic lesion within the pancreatic head at 3.2 x 3.6 cm and 41/2. 4.1 x 3.3 cm back on 10/28/2023 CT. Spleen: Normal in size, without focal abnormality. Adrenals/Urinary Tract: Normal adrenal glands. Normal kidneys, without hydronephrosis. The bladder is mildly distended and demonstrates hyperattenuation within, most likely residual contrast. Stomach/Bowel: Borderline gastric distension. Scattered colonic diverticula.  Normal terminal ileum. Small bowel loops measure up to 3.7 cm versus 4.3 cm at the same level on the prior. No focal transition identified. No pneumatosis or free intraperitoneal air. Vascular/Lymphatic: Advanced aortic and branch vessel atherosclerosis. No abdominopelvic adenopathy. Reproductive: Hysterectomy.  No adnexal mass. Other: No significant free fluid. Musculoskeletal: Dorsal spinal stimulator with battery pack about the low lateral right pelvic wall. Osteopenia. Left hip arthroplasty. Lumbosacral spondylosis. Convex right thoracolumbar spine curvature. IMPRESSION: 1. Common duct stents in place with pneumobilia and biliary duct dilatation. Especially when compared to 01/13/2024, the stents have been repositioned or migrated distally and the common duct dilatation above the stents is new. Consider ERCP/stent repositioning. 2. Similar  pancreatic duct dilatation and upstream atrophy presumably secondary to a pancreatic head/periampullary cystic lesion which is decreased in size compared to 10/28/2023. This likely represents the intraductal papillary mucinous tumor described in EMR. 3. Improved small bowel distension without focal transition point. Favor adynamic ileus versus low-grade partial small bowel obstruction. 4. Incidental findings, including: Coronary artery atherosclerosis. Aortic Atherosclerosis (ICD10-I70.0). Emphysema (ICD10-J43.9). Electronically Signed   By: Jeronimo Greaves M.D.   On: 02/14/2024 14:45   [place] EKG SITE RITE Result Date: 02/14/2024 If Site Rite image not attached, placement could not be confirmed due to current cardiac rhythm.  DG Abd 1 View Result Date: 02/14/2024 CLINICAL DATA:  Enteric tube placement. EXAM: ABDOMEN - 1 VIEW COMPARISON:  Abdominal radiograph performed the same day. FINDINGS: An enteric tube reaches the lower esophagus, but appears coiled on itself. Common bile duct stents are noted. Spinal stimulator leads are partially imaged. IMPRESSION: Enteric tube reaches the lower esophagus, but appears coiled on itself. This enteric tube does not appear to enter the stomach. Electronically Signed   By: Romona Curls M.D.   On: 02/14/2024 13:44   DG Abd Portable 1V Result Date: 02/14/2024 CLINICAL DATA:  Nasogastric tube placement. EXAM: PORTABLE ABDOMEN - 1 VIEW COMPARISON:  Abdominal radiograph yesterday FINDINGS: Tip and side port of the enteric tube below the diaphragm in the stomach. Plastic biliary stent unchanged in position by radiograph. There is gaseous distention of bowel loops in the upper abdomen. Right upper quadrant surgical clips. Spinal stimulator in place. IMPRESSION: Tip and side port of the enteric tube below the diaphragm in the stomach. Electronically Signed   By:  Narda Rutherford M.D.   On: 02/14/2024 12:24   DG Abd Portable 1V Result Date: 02/13/2024 CLINICAL DATA:  NG tube placement  EXAM: PORTABLE ABDOMEN - 1 VIEW COMPARISON:  Earlier today at 7:57 a.m. FINDINGS: Nasogastric tube terminates at the gastric body with side port well below the gastroesophageal junction. Dorsal spinal stimulator. Common duct stent. Cholecystectomy. No gross free intraperitoneal air. Prominent gas-filled loops of upper abdominal small bowel are relatively similar. Contrast remains within the stomach. IMPRESSION: Nasogastric tube appropriately positioned. Electronically Signed   By: Jeronimo Greaves M.D.   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

## 2024-02-15 NOTE — Progress Notes (Signed)
 Received a call back from neurosurgery PA, Armando Reichert who reviewed patient's CT. Per Armando Reichert it is unusual to place spinal stimulator in pelvis.  She is not sure if this is contributing to RLQ pain but no surgical avenue from neurosurgery standpoint.  Per daughter, patient has had large loose bowel movements.

## 2024-02-15 NOTE — Evaluation (Signed)
 Occupational Therapy Evaluation Patient Details Name: Sherry Holloway MRN: [id number] DOB: Aug 13, 1928 Today's Date: 02/15/2024   History of Present Illness   Patient is a 89 year old female who presented with unintentional weight loss and intermittent RUQ pain. Patient was found to have small bowel obstruction. PMH; chronic calcific pancreatitis, chronic bilary obstruction. HTN, NIDDM II, hypokalemia, PE. R TKA, esophagus cancer,     Clinical Impressions Patient is a 89 year old female who was admitted for above. Patient was living at home alone prior level. Patient is currently +2 to advance to EOB with education on log rolling to avoid pressure on abdomen with transition. Patient noted to hold R side consistently during session with increased edema in hand from keeping in this position. MD messaged about appropriateness of abdominal binder to provide support but enable patient to use BUE to move. Patient was noted to have decreased functional activity tolerance, decreased endurance, decreased standing balance, decreased safety awareness, and decreased knowledge of AD/AE impacting participation in ADLs. Patient would continue to benefit from skilled OT services at this time while admitted and after d/c to address noted deficits in order to improve overall safety and independence in ADLs. Plan is to d/c home with daughter and [facility] services when medically stable.      If plan is discharge home, recommend the following:   Two people to help with walking and/or transfers;Direct supervision/assist for financial management;Help with stairs or ramp for entrance;Assist for transportation;Direct supervision/assist for medications management;Assistance with cooking/housework;Two people to help with bathing/dressing/bathroom     Functional Status Assessment   Patient has had a recent decline in their functional status and demonstrates the ability to make significant improvements in function in a  reasonable and predictable amount of time.     Equipment Recommendations   None recommended by OT      Precautions/Restrictions   Precautions Precautions: Fall Restrictions Weight Bearing Restrictions Per Provider Order: No     Mobility Bed Mobility Overal bed mobility: Needs Assistance Bed Mobility: Supine to Sit     Supine to sit: +2 for safety/equipment, +2 for physical assistance, Max assist Sit to supine: +2 for physical assistance, +2 for safety/equipment, Max assist   General bed mobility comments: with education on rolling and use of bed rails.          Balance Overall balance assessment: Needs assistance Sitting-balance support: No upper extremity supported, Feet supported Sitting balance-Leahy Scale: Poor               ADL either performed or assessed with clinical judgement   ADL Overall ADL's : Needs assistance/impaired Eating/Feeding: NPO   Grooming: Sitting;Maximal assistance   Upper Body Bathing: Sitting;Maximal assistance   Lower Body Bathing: Maximal assistance;Sitting/lateral leans   Upper Body Dressing : Sitting;Maximal assistance   Lower Body Dressing: Sitting/lateral leans;Maximal assistance     Toilet Transfer Details (indicate cue type and reason): unable to +2 to advance to EOB and maintain sitting balance. patient holdign R side whole time. Toileting- Clothing Manipulation and Hygiene: Bed level;Total assistance                Pertinent Vitals/Pain Pain Assessment Pain Assessment: Faces Faces Pain Scale: Hurts little more Pain Location: bacvk pain and R ABD Pain Descriptors / Indicators: Aching, Grimacing, Guarding Pain Intervention(s): Limited activity within patient's tolerance, Monitored during session     Extremity/Trunk Assessment Upper Extremity Assessment Upper Extremity Assessment: Overall WFL for tasks assessed;RUE deficits/detail RUE Deficits / Details: noted  to have edema and less strength in RUE  compared to LUE on this date.   Lower Extremity Assessment Lower Extremity Assessment: Defer to PT evaluation   Cervical / Trunk Assessment Cervical / Trunk Assessment: Kyphotic   Communication     Cognition Arousal: Alert Behavior During Therapy: WFL for tasks assessed/performed               OT - Cognition Comments: patient fatigued during session. patient's family in room answering questions prior to patient being able to answer.                 Following commands: Intact                  Home Living Family/patient expects to be discharged to:: Private residence Living Arrangements: Children Available Help at Discharge: Family;Available PRN/intermittently Type of Home: House Home Access: Stairs to enter [employer] of Steps: 1   Home Layout: Able to live on main level with bedroom/bathroom     Bathroom Shower/Tub: Producer, television/film/video: Handicapped height Bathroom Accessibility: Yes   Home Equipment: Rollator (4 wheels)   Additional Comments: Pt plans to stay with dtr and information above pertains to dtrs home      Prior Functioning/Environment Prior Level of Function : Independent/Modified Independent               ADLs Comments: Pt was I B/D PTA, Pt cleans and cooks some    OT Problem List: Impaired balance (sitting and/or standing);Decreased safety awareness;Decreased knowledge of precautions;Decreased knowledge of use of DME or AE;Decreased activity tolerance   OT Treatment/Interventions: Self-care/ADL training;DME and/or AE instruction;Therapeutic activities;Balance training;Patient/family education;Energy conservation      OT Goals(Current goals can be found in the care plan section)   Acute Rehab OT Goals Patient Stated Goal: to go to daughters house OT Goal Formulation: With patient/family Time For Goal Achievement: 02/29/24 Potential to Achieve Goals: Fair   OT Frequency:  Min 1X/week        AM-PAC OT "6 Clicks" Daily Activity     Outcome Measure Help from another person eating meals?: Total (NPO) Help from another person taking care of personal grooming?: A Lot Help from another person toileting, which includes using toliet, bedpan, or urinal?: A Lot Help from another person bathing (including washing, rinsing, drying)?: A Lot Help from another person to put on and taking off regular upper body clothing?: A Lot Help from another person to put on and taking off regular lower body clothing?: A Lot 6 Click Score: 11   End of Session Nurse Communication: Mobility status  Activity Tolerance: Patient tolerated treatment well Patient left: with call bell/phone within reach;in bed;with bed alarm set;with family/visitor present  OT Visit Diagnosis: Unsteadiness on feet (R26.81);Other abnormalities of gait and mobility (R26.89)                Time: 9147-8295 OT Time Calculation (min): 16 min Charges:  OT General Charges $OT Visit: 1 Visit OT Evaluation $OT Eval Low Complexity: 1 Low  Tyriana Helmkamp OTR/L, MS Acute Rehabilitation Department Office# [phone]   Selinda Flavin 02/15/2024, 2:01 PM

## 2024-02-15 NOTE — Progress Notes (Signed)
 Peripherally Inserted Central Catheter Placement  The IV Nurse has discussed with the patient and/or persons authorized to consent for the patient, the purpose of this procedure and the potential benefits and risks involved with this procedure.  The benefits include less needle sticks, lab draws from the catheter, and the patient may be discharged home with the catheter. Risks include, but not limited to, infection, bleeding, blood clot (thrombus formation), and puncture of an artery; nerve damage and irregular heartbeat and possibility to perform a PICC exchange if needed/ordered by physician.  Alternatives to this procedure were also discussed.  Bard Power PICC patient education guide, fact sheet on infection prevention and patient information card has been provided to patient /or left at bedside.    PICC Placement Documentation  PICC Double Lumen 02/15/24 Right Basilic 35 cm 0 cm (Active)  Indication for Insertion or Continuance of Line Administration of hyperosmolar/irritating solutions (i.e. TPN, Vancomycin, etc.) 02/15/24 0832  Exposed Catheter (cm) 0 cm 02/15/24 0832  Site Assessment Clean, Dry, Intact 02/15/24 0832  Lumen #1 Status Flushed;Saline locked;Blood return noted 02/15/24 0832  Lumen #2 Status Flushed;Saline locked;Blood return noted 02/15/24 0832  Dressing Type Transparent;Securing device 02/15/24 1610  Dressing Status Antimicrobial disc/dressing in place;Clean, Dry, Intact 02/15/24 0832  Line Care Connections checked and tightened 02/15/24 9604  Line Adjustment (NICU/IV Team Only) No 02/15/24 0832  Dressing Intervention New dressing;Adhesive placed at insertion site (IV team only);Adhesive placed around edges of dressing (IV team/ICU RN only) 02/15/24 0832  Dressing Change Due 02/22/24 02/15/24 5409       Elliot Dally 02/15/2024, 8:33 AM

## 2024-02-15 NOTE — Progress Notes (Signed)
 PROGRESS NOTE  Sherry Holloway ZOX:[id number] DOB: 1928-06-24   PCP: Donato Schultz, DO  Patient is from: Home.  DOA: 02/04/2024 LOS: 11  Chief complaints Chief Complaint  Patient presents with   Abdominal Pain     Brief Narrative / Interim history: 89 year old F with PMH of esophageal cancer, chronic calcific pancreatitis, pancreatic insufficiency, biliary stones s/p biliary stents in 10/2023, chronic pain s/p spinal nerve stimulator, HTN, HLD, GERD, DM-2 and OSA presented to ED with intermittent RUQ pain and unintentional weight loss since mid January and admitted with working diagnosis of small bowel obstruction.  She was managed conservatively with NG tube and general surgery guidance.  She had a bowel movements and NG tube was removed.  However, she had abdominal distention again with significant leukocytosis.  CT abdomen and pelvis showed abdominal distention with dilated bowels.  GI consulted and general surgery reengaged.  SBO protocol on 2/27 and 2/28 with slow progression of oral contrast within multiple dilated small bowel loops consistent with ileus or partial obstruction.  She has significantly rising leukocytosis with persistent abdominal distention and tenderness.  Repeat CT abdomen and pelvis on 3/1 with pneumobilia and new biliary ductal dilation above distally migrated biliary duct stent, similar pancreatic ductal dilation and improved small bowel distention.  PICC line placed and TPN ordered.  Surgery and GI following.  Subjective: Seen and examined earlier this morning.  No major events overnight of this morning.  She had about 1 L output from NG tube overnight.  Reports pain in RLQ.  Patient's daughter at bedside and asking about possibility of colonoscopy or other measures to decompress bowel  Objective: Vitals:   02/14/24 1228 02/14/24 1515 02/14/24 2059 02/15/24 0452  BP: (!) 164/44 (!) 165/51 (!) 152/45 (!) 160/61  Pulse: 77 91 81 78  Resp: 18  15 16   Temp:  (!) 97.5 F (36.4 C)   98.6 F (37 C)  TempSrc: Oral     SpO2: 96% 94% 94% 94%  Weight:      Height:        Examination:  GENERAL: No apparent distress.  Nontoxic. HEENT: MMM.  Vision and hearing grossly intact.  NGT in place. NECK: Supple.  No apparent JVD.  RESP:  No IWOB.  Fair aeration bilaterally. CVS:  RRR. Heart sounds normal.  ABD/GI/GU: BS present.  Soft.  RLQ tenderness and guarding. MSK/EXT:  Moves extremities. No apparent deformity. No edema.  SKIN: no apparent skin lesion or wound NEURO: Awake, alert and oriented appropriately.  No apparent focal neuro deficit. PSYCH: Calm. Normal affect.   Procedures:  None  Microbiology summarized: 2/24-C. difficile negative. 3/1-blood cultures NGTD.  Assessment and plan: Small bowel obstruction:  -2/19-initial CT showed SBO with transition point in right anatomic pelvis.  Resolved after NG tube and bowel rest.    -2/21-SBO protocol showed contrast in colon.   -2/25-patient felt worse again.  CT with diffuse dilation of small bowel but decreased caliber, and contrast in colon.   -2/27-SBO protocol with no contrast in right colon.  Progressive distention and leukocytosis.   -3/1-repeat CT  with pneumobilia and new biliary ductal dilation above distally migrated stent, similar pancreatic ductal dilation and improved small bowel distention.  UA and blood cultures negative.  Started on Zosyn.  PICC line placed.  TPN ordered. -GI and general surgery on board. -Continue IV Dilaudid 0.5 mg every 3 hours for moderate and severe pain -Continue IV fluid -Mobilize patient, PT/OT -Surgery and GI on  board.  Chronic calcific pancreatitis/chronic biliary obstruction/pancreatic insufficiency -S/p multiple ERCPs.  -Continue home Creon when she starts taking p.o. -GI on board  Chronic pain/RLQ pain: Patient has spinal stimulator placed in [place] in 2004. There is question if this could be contributing to her LLQ tenderness and pain but  there is no mention of infection or abnormality on CT report. Patient is holding her RLQ and very tender in that area.  -Neurosurgery consult requested. -Pain management as above  Essential hypertension: SBP slightly elevated with DBP in 50s. -Pain control as above -IV hydralazine as needed  NIDDM-2 with hyperglycemia: A1c 7.7% in 10/2023 Recent Labs  Lab 02/14/24 2057 02/14/24 2332 02/15/24 0347 02/15/24 0740 02/15/24 1145  GLUCAP 143* 124* 100* 143* 150*  -Continue SSI-very sensitive every 4 hours while NPO  Hypokalemia -Monitor replenish as appropriate  Hyponatremia: Mild.  Resolved. -IV fluid as above   Right subclavian stenosis: Chronic. Daughter reported permissive hypertension recommended by PCP due to this but no documentation -Outpatient follow-up. -Check blood pressure on left arm.   GERD -Continue PPI   Hyperlipidemia -Hold statin.   History of PE, on Xarelto outpatient -Hold anticoagulation in case she needs surgery   Leukocytosis:  Has no respiratory symptoms.  UA negative.  Blood cultures NGTD.  No clear source of infection yet.  Leukemoid reaction? -IV antibiotics as above -Continue monitoring  Body mass index is 22.74 kg/m.     DVT prophylaxis:  Place and maintain sequential compression device Start: 02/05/24 1041  Code Status: DNR/DNI Family Communication: None at bedside Level of care: Med-Surg Status is: Inpatient Remains inpatient appropriate because: Small bowel distention/obstruction/ileus   Final disposition: To be determined Consultants:  General surgery Gastroenterology Neurosurgery  55 minutes with more than 50% spent in reviewing records, counseling patient/family and coordinating care.   Sch Meds:  Scheduled Meds:  bisacodyl  10 mg Rectal Daily   Chlorhexidine Gluconate Cloth  6 each Topical Daily   insulin aspart  0-15 Units Subcutaneous Q4H   latanoprost  1 drop Both Eyes QHS   lipase/protease/amylase)  20,880  Units Per Tube Once   And   sodium bicarbonate  650 mg Per Tube Once   pantoprazole (PROTONIX) IV  40 mg Intravenous QHS   sucralfate  1 g Oral BID   thiamine (VITAMIN B1) injection  100 mg Intravenous Q24H   Continuous Infusions:  lactated ringers 1,000 mL with potassium chloride 20 mEq infusion 100 mL/hr at 02/15/24 1037   lactated ringers 1,000 mL with potassium chloride 20 mEq infusion     lactated ringers     magnesium sulfate bolus IVPB     piperacillin-tazobactam (ZOSYN)  IV 12.5 mL/hr at 02/15/24 1037   potassium chloride 10 mEq (02/15/24 1141)   TPN ADULT (ION)     PRN Meds:.acetaminophen, acetaminophen, hydrALAZINE, HYDROmorphone (DILAUDID) injection, lactated ringers, magic mouthwash, menthol-cetylpyridinium, naphazoline-glycerin, ondansetron (ZOFRAN) IV, mouth rinse, phenol, prochlorperazine, simethicone, sodium chloride, sodium chloride flush  Antimicrobials: Anti-infectives (From admission, onward)    Start     Dose/Rate Route Frequency Ordered Stop   02/14/24 1000  piperacillin-tazobactam (ZOSYN) IVPB 3.375 g        3.375 g 12.5 mL/hr over 240 Minutes Intravenous Every 8 hours 02/14/24 0912          I have personally reviewed the following labs and images: CBC: Recent Labs  Lab 02/11/24 0442 02/12/24 0434 02/13/24 1813 02/14/24 0522 02/15/24 0429  WBC 19.0* 20.1* 30.6* 29.9* 29.3*  NEUTROABS  --   --   --  27.3*  --   HGB 11.7* 12.7 13.3 11.1* 10.6*  HCT 35.3* 39.3 44.4 35.9* 33.9*  MCV 84.2 85.2 89.0 87.8 86.7  PLT 259 278 356 317 324   BMP &GFR Recent Labs  Lab 02/11/24 0442 02/12/24 0434 02/13/24 0847 02/14/24 0522 02/15/24 0429  NA 130* 130* 130* 137 135  K 3.1* 3.3* 3.9 3.7 3.1*  CL 96* 98 100 103 100  CO2 24 22 20* 23 22  GLUCOSE 158* 173* 191* 176* 106*  BUN 12 12 23  27* 27*  CREATININE 0.32* 0.54 0.53 0.54 0.49  CALCIUM 8.8* 9.3 9.5 9.5 9.5  MG  --  2.0 2.1 1.9 1.9  PHOS  --  2.9  --  2.5 2.6   Estimated Creatinine Clearance:  37.1 mL/min (by C-G formula based on SCr of 0.49 mg/dL). Liver & Pancreas: Recent Labs  Lab 02/11/24 0442 02/12/24 0434 02/13/24 0847 02/14/24 0522 02/15/24 0429  AST 8* 9* 8* 10* 10*  ALT 15 16 16 15 15   ALKPHOS 62 74 86 80 80  BILITOT 0.5 0.5 0.6 0.8 0.8  PROT 5.3* 5.6* 5.9* 5.4* 5.2*  ALBUMIN 2.4* 2.5* 2.5* 2.1* 2.0*   No results for input(s): "LIPASE", "AMYLASE" in the last 168 hours. No results for input(s): "AMMONIA" in the last 168 hours. Diabetic: Recent Labs    02/14/24 0522  HGBA1C 7.7*   Recent Labs  Lab 02/14/24 2057 02/14/24 2332 02/15/24 0347 02/15/24 0740 02/15/24 1145  GLUCAP 143* 124* 100* 143* 150*   Cardiac Enzymes: No results for input(s): "CKTOTAL", "CKMB", "CKMBINDEX", "TROPONINI" in the last 168 hours. Recent Labs    08/04/23 1422  PROBNP 251.0*   Coagulation Profile: No results for input(s): "INR", "PROTIME" in the last 168 hours. Thyroid Function Tests: No results for input(s): "TSH", "T4TOTAL", "FREET4", "T3FREE", "THYROIDAB" in the last 72 hours. Lipid Profile: No results for input(s): "CHOL", "HDL", "LDLCALC", "TRIG", "CHOLHDL", "LDLDIRECT" in the last 72 hours. Anemia Panel: No results for input(s): "VITAMINB12", "FOLATE", "FERRITIN", "TIBC", "IRON", "RETICCTPCT" in the last 72 hours. Urine analysis:    Component Value Date/Time   COLORURINE YELLOW 02/14/2024 1807   APPEARANCEUR CLEAR 02/14/2024 1807   LABSPEC >1.046 (H) 02/14/2024 1807   PHURINE 5.0 02/14/2024 1807   GLUCOSEU >=500 (A) 02/14/2024 1807   HGBUR NEGATIVE 02/14/2024 1807   BILIRUBINUR NEGATIVE 02/14/2024 1807   BILIRUBINUR negative 08/04/2023 1456   KETONESUR 20 (A) 02/14/2024 1807   PROTEINUR 30 (A) 02/14/2024 1807   UROBILINOGEN 0.2 08/04/2023 1456   NITRITE NEGATIVE 02/14/2024 1807   LEUKOCYTESUR NEGATIVE 02/14/2024 1807   Sepsis Labs: Invalid input(s): "PROCALCITONIN", "LACTICIDVEN"  Microbiology: Recent Results (from the past 240 hours)  C Difficile  Quick Screen (NO PCR Reflex)     Status: None   Collection Time: 02/09/24  1:48 PM   Specimen: STOOL  Result Value Ref Range Status   C Diff antigen NEGATIVE NEGATIVE Final   C Diff toxin NEGATIVE NEGATIVE Final   C Diff interpretation No C. difficile detected.  Final    Comment: Performed at [facility], [address] [place], [place] [address]  Culture, blood (Routine X 2) w Reflex to ID Panel     Status: None (Preliminary result)   Collection Time: 02/14/24  4:35 PM   Specimen: BLOOD LEFT ARM  Result Value Ref Range Status   Specimen Description   Final    BLOOD LEFT ARM Performed at [facility], [address], [place], [place]  [address]    Special Requests   Final    BOTTLES DRAWN AEROBIC ONLY Blood Culture results may not be optimal due to an inadequate volume of blood received in culture bottles Performed at [facility], [address] [place], [place] [address]    Culture   Final    NO GROWTH < 24 HOURS Performed at [facility], [address], [place], [place] [address]    Report Status PENDING  Incomplete  Culture, blood (Routine X 2) w Reflex to ID Panel     Status: None (Preliminary result)   Collection Time: 02/14/24  4:45 PM   Specimen: BLOOD  Result Value Ref Range Status   Specimen Description   Final    BLOOD Performed at [facility], [address], [place], [place] [address]    Special Requests   Final    BOTTLES DRAWN AEROBIC AND ANAEROBIC Blood Culture results may not be optimal due to an inadequate volume of blood received in culture bottles Performed at [facility], [address], [place], [place] [address]    Culture   Final    NO GROWTH < 24 HOURS Performed at [facility], [address] [place], [place] [address]    Report Status PENDING  Incomplete    Radiology Studies: DG Abd 1 View Result Date: 02/14/2024 CLINICAL DATA:  Enteric tube  placement. EXAM: ABDOMEN - 1 VIEW COMPARISON:  Same day abdominal radiograph. FINDINGS: An enteric tube terminates in the stomach. IMPRESSION: Enteric tube terminates in the stomach. Electronically Signed   By: Romona Curls M.D.   On: 02/14/2024 16:56   CT ABDOMEN PELVIS W CONTRAST Result Date: 02/14/2024 CLINICAL DATA:  Abdominal pain. Peritonitis or perforation suspected. Past medical history includes pancreatic mass. Esophageal cancer. 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 Biliary obstruction. * Tracking Code: BO * EXAM: CT ABDOMEN AND PELVIS WITH CONTRAST TECHNIQUE: Multidetector CT imaging of the abdomen and pelvis was performed using the standard protocol following bolus administration of intravenous contrast. RADIATION DOSE REDUCTION: This exam was performed according to the departmental dose-optimization program which includes automated exposure control, adjustment of the mA and/or kV according to patient size and/or use of iterative reconstruction technique. CONTRAST:  30mL OMNIPAQUE IOHEXOL 300 MG/ML SOLN, OMNIPAQUE IOHEXOL 300 MG/ML SOLN COMPARISON:  02/10/2024 FINDINGS: Lower chest: Emphysema. Motion degradation at the lung bases. Bibasilar scarring. Normal heart size. Trace left pleural fluid. Multivessel coronary artery atherosclerosis. Hepatobiliary: No suspicious liver lesion. Moderate pneumobilia within the left hepatic lobe. moderate intrahepatic biliary duct dilatation. 2 common duct stents are unchanged in position. These originate in the hepatic hilum and just above the pancreatic head. Terminate within the descending duodenum. On coronal reformats, the common duct is dilated at up to 1.6 cm above the proximal most stent (39/5). Especially when compared to the exam of 01/13/2024, the stents have been repositioned or migrated caudally today. Pancreas: Pancreatic marked parenchymal atrophy with moderate duct dilatation, similar at 8 mm. Followed to the level of the  ampulla. residual cystic lesion within the pancreatic head at 3.2 x 3.6 cm and 41/2. 4.1 x 3.3 cm back on 10/28/2023 CT. Spleen: Normal in size, without focal abnormality. Adrenals/Urinary Tract: Normal adrenal glands. Normal kidneys, without hydronephrosis. The bladder is mildly distended and demonstrates hyperattenuation within, most likely residual contrast. Stomach/Bowel: Borderline gastric distension. Scattered colonic diverticula.  Normal terminal ileum. Small bowel loops measure up to 3.7 cm versus 4.3  cm at the same level on the prior. No focal transition identified. No pneumatosis or free intraperitoneal air. Vascular/Lymphatic: Advanced aortic and branch vessel atherosclerosis. No abdominopelvic adenopathy. Reproductive: Hysterectomy.  No adnexal mass. Other: No significant free fluid. Musculoskeletal: Dorsal spinal stimulator with battery pack about the low lateral right pelvic wall. Osteopenia. Left hip arthroplasty. Lumbosacral spondylosis. Convex right thoracolumbar spine curvature. IMPRESSION: 1. Common duct stents in place with pneumobilia and biliary duct dilatation. Especially when compared to 01/13/2024, the stents have been repositioned or migrated distally and the common duct dilatation above the stents is new. Consider ERCP/stent repositioning. 2. Similar pancreatic duct dilatation and upstream atrophy presumably secondary to a pancreatic head/periampullary cystic lesion which is decreased in size compared to 10/28/2023. This likely represents the intraductal papillary mucinous tumor described in EMR. 3. Improved small bowel distension without focal transition point. Favor adynamic ileus versus low-grade partial small bowel obstruction. 4. Incidental findings, including: Coronary artery atherosclerosis. Aortic Atherosclerosis (ICD10-I70.0). Emphysema (ICD10-J43.9). Electronically Signed   By: Jeronimo Greaves M.D.   On: 02/14/2024 14:45   [place] EKG SITE RITE Result Date: 02/14/2024 If Site Rite  image not attached, placement could not be confirmed due to current cardiac rhythm.  DG Abd 1 View Result Date: 02/14/2024 CLINICAL DATA:  Enteric tube placement. EXAM: ABDOMEN - 1 VIEW COMPARISON:  Abdominal radiograph performed the same day. FINDINGS: An enteric tube reaches the lower esophagus, but appears coiled on itself. Common bile duct stents are noted. Spinal stimulator leads are partially imaged. IMPRESSION: Enteric tube reaches the lower esophagus, but appears coiled on itself. This enteric tube does not appear to enter the stomach. Electronically Signed   By: Romona Curls M.D.   On: 02/14/2024 13:44      Lucky Alverson T. Zoeya Gramajo [facility]  If 7PM-7AM, please contact night-coverage [email or web address] 02/15/2024, 12:13 PM

## 2024-02-15 NOTE — Progress Notes (Signed)
 02/15/2024  Sherry Holloway [id number] 1928-07-20  CARE TEAM: PCP: Donato Schultz, DO  Outpatient Care Team: Patient Care Team: Zola Button, Grayling Congress, DO as PCP - General (Family Medicine) Croitoru, Rachelle Hora, MD as PCP - Cardiology (Cardiology) Sheran Luz, MD as Consulting Physician (Physical Medicine and Rehabilitation) Iva Boop, MD as Consulting Physician (Gastroenterology) Ladene Artist, MD as Consulting Physician (Oncology) Radonna Ricker, RN (Inactive) as Oncology Nurse Navigator  Inpatient Treatment Team: Treatment Team:  Almon Hercules, MD Lawrence Santiago, MD Hilarie Fredrickson, MD Ccs, Md, MD Stowell, Essence, NT [place], Cheri Fowler, RN Dyke Brackett, NT Selinda Flavin, OT Bess Kinds, RN Susy Frizzle, RN   Problem List:   Principal Problem:   SBO (small bowel obstruction) [facility]) Active Problems:   Pancreatic mass   Gastroesophageal reflux disease with esophagitis without hemorrhage   Hypertension   Uncontrolled type 2 diabetes mellitus with hyperglycemia, without long-term current use of insulin (HCC)   Hyperlipidemia   OSA (obstructive sleep apnea)   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)   Type 2 diabetes mellitus with diabetic polyneuropathy, without long-term current use of insulin (HCC)   Chronic pain disorder   Nausea and vomiting   Abdominal pain, chronic, right lower quadrant   Ileus (HCC)   * No surgery found *      Assessment [facility] Stay = 11 days)      Nausea vomiting abdominal pain with question of bowel obstruction initially improved but now worse Plan:  SBO vs ileus in setting of Chronic constipation  -  CT scan of the abdomen and pelvis 2/25 showed small bowel dilation with question of  transition point in RLQ.  Contrast getting to transverse colon arguing against complete obstruction.  No pneumoperitoneum or signs of bowel ischemia on CT. Visible biliary stents noted in place from her history of  choledocholithiasis 10/2023. Gallbladder surgically absent.   - SBO protocol 2/21 - contrast passed into colon.  Initially had okay but felt worse.  NG tube back in yesterday.  Low output but seems rather thick concerning for chronic obstruction or at least significant ileus.  Not much relief with enema.  Moderately distended and uncomfortable especially right lower quadrant.  Agree with repeating CAT scan this morning.  If it shows concern for persistent obstruction, ischemia or perforation; then, most likely operative exploration.  I updated the patient's status to the patient especially her daughter at bedside.  Recommendations were made.  Questions were answered.  They expressed understanding & appreciation.   - NPO except ice chips and sips  - mobility as tolerated  - Keep K>4.0 and Mg >2.0    FEN: NG tube.  IV fluid.  Agree with starting TPN for malnutrition.   VTE: CDs.  No Xarelto for now. ID: no current abx   - per TRH -  HTN HLD OSA Hx if uterine cancer Hx of esophageal cancer GERD T2DM  LOS: 9 days       I reviewed nursing notes, hospitalist notes, last 24 h vitals and pain scores, last 48 h intake and output, last 24 h labs and trends, and last 24 h imaging results.  I have reviewed this patient's available data, including medical history, events of note, test results, etc as part of my evaluation.   A significant portion of that time was spent in counseling. Care during the described time interval was provided by me.  This care required high  level  of medical decision making.  02/15/2024    Subjective: (Chief complaint)  Patient feeling worse.  NG tube replaced.  Still sore and uncomfortable.  Daughter at bedside.  Objective:  Vital signs:  Vitals:   02/14/24 1228 02/14/24 1515 02/14/24 2059 02/15/24 0452  BP: (!) 164/44 (!) 165/51 (!) 152/45 (!) 160/61  Pulse: 77 91 81 78  Resp: 18  15 16   Temp: (!) 97.5 F (36.4 C)   98.6 F (37 C)   TempSrc: Oral     SpO2: 96% 94% 94% 94%  Weight:      Height:        Last BM Date : 02/11/24  Intake/Output   Yesterday:  03/01 0701 - 03/02 0700 In: 135.6 [P.O.:30; IV Piggyback:105.6] Out: 2930 [Urine:1880; Emesis/NG output:1050] This shift:  No intake/output data recorded.  Bowel function:  Flatus: No  BM:  No  Drain: NG tube with thick green effluent   Physical Exam:  General: Pt awake/alert in mild acute distress.  Withdrawn and not particularly interactive today Eyes: PERRL, normal EOM.  Sclera clear.  No icterus Neuro: CN II-XII intact w/o focal sensory/motor deficits. Lymph: No head/neck/groin lymphadenopathy Psych:  No delerium/psychosis/paranoia.  Oriented x 4 HENT: Normocephalic, Mucus membranes moist.  No thrush Neck: Supple, No tracheal deviation.  No obvious thyromegaly Chest: No pain to chest wall compression.  Good respiratory excursion.  No audible wheezing CV:  Pulses intact.  Regular rhythm.  No major extremity edema MS: Normal AROM mjr joints.  No obvious deformity  Abdomen: Soft.  Moderately distended.  Tenderness in right lower abdomen.  Left abdomen nontender..  No evidence of peritonitis.  No incarcerated hernias.  Ext:   No deformity.  No mjr edema.  No cyanosis Skin: No petechiae / purpurea.  No major sores.  Warm and dry    Results:   Cultures: Recent Results (from the past 720 hours)  C Difficile Quick Screen (NO PCR Reflex)     Status: None   Collection Time: 02/09/24  1:48 PM   Specimen: STOOL  Result Value Ref Range Status   C Diff antigen NEGATIVE NEGATIVE Final   C Diff toxin NEGATIVE NEGATIVE Final   C Diff interpretation No C. difficile detected.  Final    Comment: Performed at [facility], [address], [place], [place] [address]  Culture, blood (Routine X 2) w Reflex to ID Panel     Status: None (Preliminary result)   Collection Time: 02/14/24  4:35 PM   Specimen: BLOOD LEFT ARM  Result Value Ref  Range Status   Specimen Description   Final    BLOOD LEFT ARM Performed at [facility], [address], [place], [place] [address]    Special Requests   Final    BOTTLES DRAWN AEROBIC ONLY Blood Culture results may not be optimal due to an inadequate volume of blood received in culture bottles Performed at [facility], [address], [place], [place] [address]    Culture PENDING  Incomplete   Report Status PENDING  Incomplete    Labs: Results for orders placed or performed during the hospital encounter of 02/04/24 (from the past 48 hours)  Comprehensive metabolic panel     Status: Abnormal   Collection Time: 02/13/24  8:47 AM  Result Value Ref Range   Sodium 130 (L) 135 - 145 mmol/L   Potassium 3.9 3.5 - 5.1 mmol/L   Chloride 100 98 - 111 mmol/L  CO2 20 (L) 22 - 32 mmol/L   Glucose, Bld 191 (H) 70 - 99 mg/dL    Comment: Glucose reference range applies only to samples taken after fasting for at least 8 hours.   BUN 23 8 - 23 mg/dL   Creatinine, Ser 1.61 0.44 - 1.00 mg/dL   Calcium 9.5 8.9 - 09.6 mg/dL   Total Protein 5.9 (L) 6.5 - 8.1 g/dL   Albumin 2.5 (L) 3.5 - 5.0 g/dL   AST 8 (L) 15 - 41 U/L   ALT 16 0 - 44 U/L   Alkaline Phosphatase 86 38 - 126 U/L   Total Bilirubin 0.6 0.0 - 1.2 mg/dL   GFR, Estimated >04 >54 mL/min    Comment: (NOTE) Calculated using the CKD-EPI Creatinine Equation (2021)    Anion gap 10 5 - 15    Comment: Performed at [facility], [address], [place], [place] [address]  Magnesium     Status: None   Collection Time: 02/13/24  8:47 AM  Result Value Ref Range   Magnesium 2.1 1.7 - 2.4 mg/dL    Comment: Performed at [facility], [address] [place], [place] [address]  Glucose, capillary     Status: Abnormal   Collection Time: 02/13/24  5:12 PM  Result Value Ref Range   Glucose-Capillary 178 (H) 70 - 99 mg/dL    Comment: Glucose reference range applies only to samples taken  after fasting for at least 8 hours.  CBC     Status: Abnormal   Collection Time: 02/13/24  6:13 PM  Result Value Ref Range   WBC 30.6 (H) 4.0 - 10.5 K/uL   RBC 4.99 3.87 - 5.11 MIL/uL   Hemoglobin 13.3 12.0 - 15.0 g/dL   HCT 29.5 62.1 - 30.8 %   MCV 89.0 80.0 - 100.0 fL   MCH 26.7 26.0 - 34.0 pg   MCHC 30.0 30.0 - 36.0 g/dL   RDW 65.7 84.6 - 96.2 %   Platelets 356 150 - 400 K/uL   nRBC 0.0 0.0 - 0.2 %    Comment: Performed at [facility], [address] [place], [place] [address]  Glucose, capillary     Status: Abnormal   Collection Time: 02/13/24  9:06 PM  Result Value Ref Range   Glucose-Capillary 158 (H) 70 - 99 mg/dL    Comment: Glucose reference range applies only to samples taken after fasting for at least 8 hours.  Comprehensive metabolic panel     Status: Abnormal   Collection Time: 02/14/24  5:22 AM  Result Value Ref Range   Sodium 137 135 - 145 mmol/L    Comment: DELTA CHECK NOTED   Potassium 3.7 3.5 - 5.1 mmol/L   Chloride 103 98 - 111 mmol/L   CO2 23 22 - 32 mmol/L   Glucose, Bld 176 (H) 70 - 99 mg/dL    Comment: Glucose reference range applies only to samples taken after fasting for at least 8 hours.   BUN 27 (H) 8 - 23 mg/dL   Creatinine, Ser 1.32 0.44 - 1.00 mg/dL   Calcium 9.5 8.9 - 44.0 mg/dL   Total Protein 5.4 (L) 6.5 - 8.1 g/dL   Albumin 2.1 (L) 3.5 - 5.0 g/dL   AST 10 (L) 15 - 41 U/L   ALT 15 0 - 44 U/L   Alkaline Phosphatase 80 38 - 126 U/L   Total Bilirubin 0.8 0.0 - 1.2 mg/dL   GFR,  Estimated >60 >60 mL/min    Comment: (NOTE) Calculated using the CKD-EPI Creatinine Equation (2021)    Anion gap 11 5 - 15    Comment: Performed at [facility], [address], [place], [place] [address]  CBC with Differential/Platelet     Status: Abnormal   Collection Time: 02/14/24  5:22 AM  Result Value Ref Range   WBC 29.9 (H) 4.0 - 10.5 K/uL   RBC 4.09 3.87 - 5.11 MIL/uL   Hemoglobin 11.1 (L) 12.0 - 15.0 g/dL   HCT  60.4 (L) 54.0 - 46.0 %   MCV 87.8 80.0 - 100.0 fL   MCH 27.1 26.0 - 34.0 pg   MCHC 30.9 30.0 - 36.0 g/dL   RDW 98.1 19.1 - 47.8 %   Platelets 317 150 - 400 K/uL   nRBC 0.0 0.0 - 0.2 %   Neutrophils Relative % 91 %   Neutro Abs 27.3 (H) 1.7 - 7.7 K/uL   Lymphocytes Relative 3 %   Lymphs Abs 0.8 0.7 - 4.0 K/uL   Monocytes Relative 5 %   Monocytes Absolute 1.5 (H) 0.1 - 1.0 K/uL   Eosinophils Relative 0 %   Eosinophils Absolute 0.0 0.0 - 0.5 K/uL   Basophils Relative 0 %   Basophils Absolute 0.1 0.0 - 0.1 K/uL   WBC Morphology MORPHOLOGY UNREMARKABLE    RBC Morphology MORPHOLOGY UNREMARKABLE    Smear Review Normal platelet morphology    Immature Granulocytes 1 %   Abs Immature Granulocytes 0.21 (H) 0.00 - 0.07 K/uL    Comment: Performed at [facility], [address], [place], [place] [address]  Phosphorus     Status: None   Collection Time: 02/14/24  5:22 AM  Result Value Ref Range   Phosphorus 2.5 2.5 - 4.6 mg/dL    Comment: Performed at [facility], [address] [place], [place] [address]  Magnesium     Status: None   Collection Time: 02/14/24  5:22 AM  Result Value Ref Range   Magnesium 1.9 1.7 - 2.4 mg/dL    Comment: Performed at [facility], [address], [place], [place] [address]  Hemoglobin A1c     Status: Abnormal   Collection Time: 02/14/24  5:22 AM  Result Value Ref Range   Hgb A1c MFr Bld 7.7 (H) 4.8 - 5.6 %    Comment: (NOTE) Pre diabetes:          5.7%-6.4%  Diabetes:              >6.4%  Glycemic control for   <7.0% adults with diabetes    Mean Plasma Glucose 174.29 mg/dL    Comment: Performed at [facility], [address], [place], [place] [address]  Glucose, capillary     Status: Abnormal   Collection Time: 02/14/24  7:55 AM  Result Value Ref Range   Glucose-Capillary 167 (H) 70 - 99 mg/dL    Comment: Glucose reference range applies only to samples taken after fasting for at least  8 hours.  Lactic acid, plasma     Status: None   Collection Time: 02/14/24  8:42 AM  Result Value Ref Range   Lactic Acid, Venous 1.1 0.5 - 1.9 mmol/L    Comment: Performed at [facility], [address], [place], [place] [address]  Lactic acid, plasma     Status: None   Collection Time: 02/14/24 11:02 AM  Result Value Ref Range  Lactic Acid, Venous 1.0 0.5 - 1.9 mmol/L    Comment: Performed at [facility], [address] [place], [place] [address]  Glucose, capillary     Status: Abnormal   Collection Time: 02/14/24 12:29 PM  Result Value Ref Range   Glucose-Capillary 141 (H) 70 - 99 mg/dL    Comment: Glucose reference range applies only to samples taken after fasting for at least 8 hours.  Culture, blood (Routine X 2) w Reflex to ID Panel     Status: None (Preliminary result)   Collection Time: 02/14/24  4:35 PM   Specimen: BLOOD LEFT ARM  Result Value Ref Range   Specimen Description      BLOOD LEFT ARM Performed at [facility], [address], [place], [place] [address]    Special Requests      BOTTLES DRAWN AEROBIC ONLY Blood Culture results may not be optimal due to an inadequate volume of blood received in culture bottles Performed at [facility], [address], [place], [place] [address]    Culture PENDING    Report Status PENDING   Glucose, capillary     Status: Abnormal   Collection Time: 02/14/24  4:54 PM  Result Value Ref Range   Glucose-Capillary 149 (H) 70 - 99 mg/dL    Comment: Glucose reference range applies only to samples taken after fasting for at least 8 hours.  Urinalysis, Routine w reflex microscopic -Urine, Catheterized     Status: Abnormal   Collection Time: 02/14/24  6:07 PM  Result Value Ref Range   Color, Urine YELLOW YELLOW   APPearance CLEAR CLEAR   Specific Gravity, Urine >1.046 (H) 1.005 - 1.030   pH 5.0 5.0 - 8.0   Glucose, UA >=500 (A) NEGATIVE mg/dL   Hgb urine dipstick  NEGATIVE NEGATIVE   Bilirubin Urine NEGATIVE NEGATIVE   Ketones, ur 20 (A) NEGATIVE mg/dL   Protein, ur 30 (A) NEGATIVE mg/dL   Nitrite NEGATIVE NEGATIVE   Leukocytes,Ua NEGATIVE NEGATIVE   RBC / HPF 0-5 0 - 5 RBC/hpf   WBC, UA 6-10 0 - 5 WBC/hpf   Bacteria, UA NONE SEEN NONE SEEN   Squamous Epithelial / HPF 0-5 0 - 5 /HPF   Mucus PRESENT     Comment: Performed at [facility], [address] [place], [place] [address]  Glucose, capillary     Status: Abnormal   Collection Time: 02/14/24  8:57 PM  Result Value Ref Range   Glucose-Capillary 143 (H) 70 - 99 mg/dL    Comment: Glucose reference range applies only to samples taken after fasting for at least 8 hours.  Glucose, capillary     Status: Abnormal   Collection Time: 02/14/24 11:32 PM  Result Value Ref Range   Glucose-Capillary 124 (H) 70 - 99 mg/dL    Comment: Glucose reference range applies only to samples taken after fasting for at least 8 hours.  Glucose, capillary     Status: Abnormal   Collection Time: 02/15/24  3:47 AM  Result Value Ref Range   Glucose-Capillary 100 (H) 70 - 99 mg/dL    Comment: Glucose reference range applies only to samples taken after fasting for at least 8 hours.  CBC     Status: Abnormal   Collection Time: 02/15/24  4:29 AM  Result Value Ref Range   WBC 29.3 (H) 4.0 - 10.5 K/uL   RBC 3.91 3.87 - 5.11 MIL/uL   Hemoglobin 10.6 (L) 12.0 -  15.0 g/dL   HCT 16.1 (L) 09.6 - 04.5 %   MCV 86.7 80.0 - 100.0 fL   MCH 27.1 26.0 - 34.0 pg   MCHC 31.3 30.0 - 36.0 g/dL   RDW 40.9 81.1 - 91.4 %   Platelets 324 150 - 400 K/uL   nRBC 0.0 0.0 - 0.2 %    Comment: Performed at [facility], [address] [place], [place] [address]  Phosphorus     Status: None   Collection Time: 02/15/24  4:29 AM  Result Value Ref Range   Phosphorus 2.6 2.5 - 4.6 mg/dL    Comment: Performed at [facility], [address], [place], [place] [address]  Magnesium      Status: None   Collection Time: 02/15/24  4:29 AM  Result Value Ref Range   Magnesium 1.9 1.7 - 2.4 mg/dL    Comment: Performed at [facility], [address] [place], [place] [address]  Comprehensive metabolic panel     Status: Abnormal   Collection Time: 02/15/24  4:29 AM  Result Value Ref Range   Sodium 135 135 - 145 mmol/L   Potassium 3.1 (L) 3.5 - 5.1 mmol/L   Chloride 100 98 - 111 mmol/L   CO2 22 22 - 32 mmol/L   Glucose, Bld 106 (H) 70 - 99 mg/dL    Comment: Glucose reference range applies only to samples taken after fasting for at least 8 hours.   BUN 27 (H) 8 - 23 mg/dL   Creatinine, Ser 4.69 0.44 - 1.00 mg/dL   Calcium 9.5 8.9 - 62.9 mg/dL   Total Protein 5.2 (L) 6.5 - 8.1 g/dL   Albumin 2.0 (L) 3.5 - 5.0 g/dL   AST 10 (L) 15 - 41 U/L   ALT 15 0 - 44 U/L   Alkaline Phosphatase 80 38 - 126 U/L   Total Bilirubin 0.8 0.0 - 1.2 mg/dL   GFR, Estimated >52 >84 mL/min    Comment: (NOTE) Calculated using the CKD-EPI Creatinine Equation (2021)    Anion gap 13 5 - 15    Comment: Performed at [facility], [address] [place], [place] [address]  Glucose, capillary     Status: Abnormal   Collection Time: 02/15/24  7:40 AM  Result Value Ref Range   Glucose-Capillary 143 (H) 70 - 99 mg/dL    Comment: Glucose reference range applies only to samples taken after fasting for at least 8 hours.    Imaging / Studies: DG Abd 1 View Result Date: 02/14/2024 CLINICAL DATA:  Enteric tube placement. EXAM: ABDOMEN - 1 VIEW COMPARISON:  Same day abdominal radiograph. FINDINGS: An enteric tube terminates in the stomach. IMPRESSION: Enteric tube terminates in the stomach. Electronically Signed   By: Romona Curls M.D.   On: 02/14/2024 16:56   CT ABDOMEN PELVIS W CONTRAST Result Date: 02/14/2024 CLINICAL DATA:  Abdominal pain. Peritonitis or perforation suspected. Past medical history includes pancreatic mass. Esophageal cancer. 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 Biliary obstruction. * Tracking Code: BO * EXAM: CT ABDOMEN AND PELVIS WITH CONTRAST TECHNIQUE: Multidetector CT imaging of the abdomen and pelvis was performed using the standard protocol following bolus administration of intravenous contrast. RADIATION DOSE REDUCTION: This exam was performed according to the departmental dose-optimization program which includes automated exposure control, adjustment of the mA and/or kV according to patient size and/or use of iterative reconstruction technique. CONTRAST:  30mL OMNIPAQUE IOHEXOL 300 MG/ML SOLN,  OMNIPAQUE IOHEXOL 300 MG/ML SOLN COMPARISON:  02/10/2024 FINDINGS: Lower chest: Emphysema. Motion degradation at the lung bases. Bibasilar scarring. Normal heart size. Trace left pleural fluid. Multivessel coronary artery atherosclerosis. Hepatobiliary: No suspicious liver lesion. Moderate pneumobilia within the left hepatic lobe. moderate intrahepatic biliary duct dilatation. 2 common duct stents are unchanged in position. These originate in the hepatic hilum and just above the pancreatic head. Terminate within the descending duodenum. On coronal reformats, the common duct is dilated at up to 1.6 cm above the proximal most stent (39/5). Especially when compared to the exam of 01/13/2024, the stents have been repositioned or migrated caudally today. Pancreas: Pancreatic marked parenchymal atrophy with moderate duct dilatation, similar at 8 mm. Followed to the level of the ampulla. residual cystic lesion within the pancreatic head at 3.2 x 3.6 cm and 41/2. 4.1 x 3.3 cm back on 10/28/2023 CT. Spleen: Normal in size, without focal abnormality. Adrenals/Urinary Tract: Normal adrenal glands. Normal kidneys, without hydronephrosis. The bladder is mildly distended and demonstrates hyperattenuation within, most likely residual contrast. Stomach/Bowel: Borderline gastric distension. Scattered colonic diverticula.  Normal terminal ileum. Small  bowel loops measure up to 3.7 cm versus 4.3 cm at the same level on the prior. No focal transition identified. No pneumatosis or free intraperitoneal air. Vascular/Lymphatic: Advanced aortic and branch vessel atherosclerosis. No abdominopelvic adenopathy. Reproductive: Hysterectomy.  No adnexal mass. Other: No significant free fluid. Musculoskeletal: Dorsal spinal stimulator with battery pack about the low lateral right pelvic wall. Osteopenia. Left hip arthroplasty. Lumbosacral spondylosis. Convex right thoracolumbar spine curvature. IMPRESSION: 1. Common duct stents in place with pneumobilia and biliary duct dilatation. Especially when compared to 01/13/2024, the stents have been repositioned or migrated distally and the common duct dilatation above the stents is new. Consider ERCP/stent repositioning. 2. Similar pancreatic duct dilatation and upstream atrophy presumably secondary to a pancreatic head/periampullary cystic lesion which is decreased in size compared to 10/28/2023. This likely represents the intraductal papillary mucinous tumor described in EMR. 3. Improved small bowel distension without focal transition point. Favor adynamic ileus versus low-grade partial small bowel obstruction. 4. Incidental findings, including: Coronary artery atherosclerosis. Aortic Atherosclerosis (ICD10-I70.0). Emphysema (ICD10-J43.9). Electronically Signed   By: Jeronimo Greaves M.D.   On: 02/14/2024 14:45   [place] EKG SITE RITE Result Date: 02/14/2024 If Site Rite image not attached, placement could not be confirmed due to current cardiac rhythm.  DG Abd 1 View Result Date: 02/14/2024 CLINICAL DATA:  Enteric tube placement. EXAM: ABDOMEN - 1 VIEW COMPARISON:  Abdominal radiograph performed the same day. FINDINGS: An enteric tube reaches the lower esophagus, but appears coiled on itself. Common bile duct stents are noted. Spinal stimulator leads are partially imaged. IMPRESSION: Enteric tube reaches the lower esophagus, but  appears coiled on itself. This enteric tube does not appear to enter the stomach. Electronically Signed   By: Romona Curls M.D.   On: 02/14/2024 13:44   DG Abd Portable 1V Result Date: 02/14/2024 CLINICAL DATA:  Nasogastric tube placement. EXAM: PORTABLE ABDOMEN - 1 VIEW COMPARISON:  Abdominal radiograph yesterday FINDINGS: Tip and side port of the enteric tube below the diaphragm in the stomach. Plastic biliary stent unchanged in position by radiograph. There is gaseous distention of bowel loops in the upper abdomen. Right upper quadrant surgical clips. Spinal stimulator in place. IMPRESSION: Tip and side port of the enteric tube below the diaphragm in the stomach. Electronically Signed   By: Narda Rutherford M.D.   On: 02/14/2024 12:24   DG  Abd Portable 1V Result Date: 02/13/2024 CLINICAL DATA:  NG tube placement EXAM: PORTABLE ABDOMEN - 1 VIEW COMPARISON:  Earlier today at 7:57 a.m. FINDINGS: Nasogastric tube terminates at the gastric body with side port well below the gastroesophageal junction. Dorsal spinal stimulator. Common duct stent. Cholecystectomy. No Marielena Harvell free intraperitoneal air. Prominent gas-filled loops of upper abdominal small bowel are relatively similar. Contrast remains within the stomach. IMPRESSION: Nasogastric tube appropriately positioned. Electronically Signed   By: Jeronimo Greaves M.D.   On: 02/13/2024 19:49   DG Abd Portable 1V Result Date: 02/13/2024 CLINICAL DATA:  Small bowel obstruction. EXAM: PORTABLE ABDOMEN - 1 VIEW COMPARISON:  Abdominal x-ray from yesterday. FINDINGS: Slight progression of oral contrast within multiple dilated small bowel loops, specifically in the right abdomen. Considerable amount of contrast remains in the gastric fundus. No significant interval change in oral contrast and prominent stool in the left colon. No definite oral contrast in the right colon. IMPRESSION: 1. Slow progression of oral contrast within multiple dilated small bowel loops, consistent  with ileus or partial obstruction. Electronically Signed   By: Obie Dredge M.D.   On: 02/13/2024 12:06    Medications / Allergies: per chart  Antibiotics: Anti-infectives (From admission, onward)    Start     Dose/Rate Route Frequency Ordered Stop   02/14/24 1000  piperacillin-tazobactam (ZOSYN) IVPB 3.375 g        3.375 g 12.5 mL/hr over 240 Minutes Intravenous Every 8 hours 02/14/24 0912           Note: Portions of this report may have been transcribed using voice recognition software. Every effort was made to ensure accuracy; however, inadvertent computerized transcription errors may be present.   Any transcriptional errors that result from this process are unintentional.    Ardeth Sportsman, MD, FACS, MASCRS Esophageal, Gastrointestinal & Colorectal Surgery Robotic and Minimally Invasive Surgery  [facility] [address] [place], [place] [address] [phone] Fax [phone] Main  CONTACT INFORMATION: Weekday (9AM-5PM): Call [facility] main office at [phone] Weeknight (5PM-9AM) or Weekend/Holiday: Check EPIC "Web Links" tab & use "AMION" (password " [email or web address]") for General Surgery [facility] coverage  Please, DO NOT use SecureChat  (it is not reliable communication to reach operating surgeons & will lead to a delay in care).   Epic staff messaging available for outptient concerns needing 1-2 business day response.      02/15/2024  7:56 AM

## 2024-02-15 NOTE — Progress Notes (Signed)
 PHARMACY - TOTAL PARENTERAL NUTRITION CONSULT NOTE   Indication:  ileus in setting of chronic constipation  Patient Measurements: Height: 5\' 4"  (162.6 cm) Weight: 60.1 kg (132 lb 7.9 oz) IBW/kg (Calculated) : 54.7 TPN AdjBW (KG): 59 Body mass index is 22.74 kg/m. Usual Weight: 22 lb weight loss in last 3 months  Assessment: 89 year old F with PMH of esophageal cancer, chronic calcific pancreatitis, pancreatic insufficiency, biliary stones s/p biliary stents in 10/2023, HTN, HLD, GERD, DM-2 and OSA who presented with intermittent RUQ pain and unintentional weight loss since mid January and admitted with ileus surgery and GI following, to start TPN  Glucose / Insulin: goal < 180, CBGs 100-143 Electrolytes: WNL except K 3.1, mag 1.9 Renal: SCr 0.49 Hepatic: AST/ALT 10/15 Intake / Output; MIVF: LR with 20 mEq KCL at 100 ml/hr GI Imaging: GI Surgeries / Procedures:   Central access: 3/2 PICC placed TPN start date: 3/2  Nutritional Goals: Goal TPN rate is 65 mL/hr (provides 78 g of protein and 1575 kcals per day)  RD Assessment: Estimated Needs Total Energy Estimated Needs: 1500-1750 kcals Total Protein Estimated Needs: 75-90 grams Total Fluid Estimated Needs: >/= 1.5L  Current Nutrition:  NPO  Plan:  Now: KCL IV x 4 already ordered per Md Mag 2g IV x 1  At 1800: Start TPN at 22mL/hr at 1800 Electrolytes in TPN: Na 38mEq/L, K 41mEq/L, Ca 23mEq/L, Mg 34mEq/L, and Phos 26mmol/L. Cl:Ac 1:1 Add standard MVI and trace elements to TPN Continue Moderate q4h SSI and adjust as needed  Thiamine 100mg  daily x 5 days per RD rec's Reduce MIVF to 60 mL/hr at 1800 Monitor TPN labs on Mon/Thurs and prn  Berkley Harvey 02/15/2024,10:52 AM

## 2024-02-15 NOTE — Progress Notes (Signed)
 02/15/2024  Sherry Holloway [id number] [birth year]  CARE TEAM: PCP: Donato Schultz, DO  Outpatient Care Team: Patient Care Team: Zola Button, Grayling Congress, DO as PCP - General (Family Medicine) Croitoru, Rachelle Hora, MD as PCP - Cardiology (Cardiology) Sheran Luz, MD as Consulting Physician (Physical Medicine and Rehabilitation) Iva Boop, MD as Consulting Physician (Gastroenterology) Ladene Artist, MD as Consulting Physician (Oncology) Radonna Ricker, RN (Inactive) as Oncology Nurse Navigator  Inpatient Treatment Team: Treatment Team:  Almon Hercules, MD Lawrence Santiago, MD Hilarie Fredrickson, MD Ccs, Md, MD Selinda Flavin, OT Bess Kinds, RN Susy Frizzle, RN Howell Rucks, RN   Problem List:   Principal Problem:   SBO (small bowel obstruction) [facility]) Active Problems:   Pancreatic mass   Gastroesophageal reflux disease with esophagitis without hemorrhage   Hypertension   Uncontrolled type 2 diabetes mellitus with hyperglycemia, without long-term current use of insulin (HCC)   Hyperlipidemia   OSA (obstructive sleep apnea)   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 Stay = 11 days)     Ileus in setting of Chronic constipation   Plan:    Leukocytosis of uncertain etiology.  No evidence of any intra-abdominal source outside of some fluid around the biliary stent and that does not seem to be major concern according to GI and radiology.  Defer to primary service.  With some difficulty urinating do not know if she has retention that needs to be double check.  UA is underwhelming but consider sending culture.  I ordered chest x-ray as well given her history of emphysema.  The major on her  lower pleura on the abdominal CAT scan but defer to medicine Blood cultures yesterday negative so far.    No evidence of any intra-abdominal catastrophe from general surgery standpoint. No peritonitis.  Just discomfort at her spinal stimulator spot only.  There has not been any evidence of bowel obstruction on both CAT scans.  Suspect this is more ileus in the setting of chronic bowel dysmotility since constipation.    -Discomfort of abdomen mainly on right side near her spinal stimulator.  It does not look infected on CAT scan nor any cellulitis.  Could consider input from neurosurgery or what ever service puts those into see what they would do -needle aspiration or removal.  I am guarded against removing it in the absence of any definite infection.  Defer to primary service  Defer to Gastroenterology if biliary stent migration and fluid nearby is a concern.  Initial read with radiology last night did not sense any major abscess.  Do not know if antibiotic coverage needed or further inspection or not.  Understandable do not want to be overaggressive in an 89 year old.  -  CT scan of the abdomen and pelvis   2/25 -  showed small bowel dilation with question of  transition point in RLQ.  Contrast getting to transverse colon arguing against complete obstruction.  No pneumoperitoneum or signs of bowel ischemia on CT. Visible biliary stents noted in place from her history of choledocholithiasis 10/2023. Gallbladder surgically absent.  Repeat CT scan 3/1 again shows some mildly dilated small bowel but no bowel  obstruction.  There is no twisting or torsion or closed-loop.  No pneumatosis.  Pneumobilia especially left side of the liver but consistent with prior cholecystectomy and stenting of bile duct colon still markedly full of stool and stomach rather dilated.  Again seems all consistent with ileus with some different source.  There is no abscess or cellulitis around her subcutaneous pocket from her  stimulator of her spinal cord.  Does not look infected.  I would keep NG tube for use and high output.  Effluent less thick some bowel movements are guardedly reassuring signs the ileus may be improving.  Continue suppositories.  She had 2 bowel movements that is a guardedly hopeful sign.  Talk to nursing about trying to do another tapwater enema or 2 today to see if that helps clean things out better since she has large stool volume.    Agree with TPN for malnutrition   - NPO except ice chips and sips  - mobility as tolerated  - Keep K>4.0 and Mg >2.0    FEN: NG tube.  IV fluid.  Start TPN .   VTE: SCDs.  No Xarelto for now. ID: no current abx   - per TRH -  HTN HLD OSA Hx if uterine cancer Hx of esophageal cancer GERD T2DM  LOS: 9 days       I reviewed nursing notes, hospitalist notes, last 24 h vitals and pain scores, last 48 h intake and output, last 24 h labs and trends, and last 24 h imaging results.  I have reviewed this patient's available data, including medical history, events of note, test results, etc as part of my evaluation.   A significant portion of that time was spent in counseling. Care during the described time interval was provided by me.  This care required high  level of medical decision making.  02/15/2024    Subjective: (Chief complaint)  Nursing in room.  CT scan done.  2 bowel movements.  Still sore on her right abdomen.  Objective:  Vital signs:  Vitals:   02/14/24 1228 02/14/24 1515 02/14/24 2059 02/15/24 0452  BP: (!) 164/44 (!) 165/51 (!) 152/45 (!) 160/61  Pulse: 77 91 81 78  Resp: 18  15 16   Temp: (!) 97.5 F (36.4 C)   98.6 F (37 C)  TempSrc: Oral     SpO2: 96% 94% 94% 94%  Weight:      Height:        Last BM Date : 02/11/24  Intake/Output   Yesterday:  03/01 0701 - 03/02 0700 In: 135.6 [P.O.:30; IV Piggyback:105.6] Out: 2930 [Urine:1880; Emesis/NG output:1050] This shift:  No intake/output data  recorded.  Bowel function:  Flatus: YES  BM:  YES  Drain: NG tube with thick green effluent   Physical Exam:  General: Pt awake/alert in no acute distress.  Withdrawn and not particularly interactive today Eyes: PERRL, normal EOM.  Sclera clear.  No icterus Neuro: CN II-XII intact w/o focal sensory/motor deficits. Lymph: No head/neck/groin lymphadenopathy Psych:  No delerium/psychosis/paranoia.  Oriented x 4 HENT: Normocephalic, Mucus membranes moist.  No thrush Neck: Supple, No tracheal deviation.  No obvious thyromegaly Chest: No pain to chest wall compression.  Good respiratory excursion.  No audible wheezing CV:  Pulses intact.  Regular rhythm.  No major extremity edema MS: Normal AROM mjr joints.  No obvious deformity  Abdomen: Soft.  Nondistended.  Most of abdomen nontender.  Just mild discomfort at right side near her subcutaneous pocket of  her stimulator.  No cellulitis abscess or purulence.  No evidence of peritonitis.  No incarcerated hernias.  Ext:   No deformity.  No mjr edema.  No cyanosis Skin: No petechiae / purpurea.  No major sores.  Warm and dry    Results:   Cultures: Recent Results (from the past 720 hours)  C Difficile Quick Screen (NO PCR Reflex)     Status: None   Collection Time: 02/09/24  1:48 PM   Specimen: STOOL  Result Value Ref Range Status   C Diff antigen NEGATIVE NEGATIVE Final   C Diff toxin NEGATIVE NEGATIVE Final   C Diff interpretation No C. difficile detected.  Final    Comment: Performed at [facility], [address], [place], [place] [address]  Culture, blood (Routine X 2) w Reflex to ID Panel     Status: None (Preliminary result)   Collection Time: 02/14/24  4:35 PM   Specimen: BLOOD LEFT ARM  Result Value Ref Range Status   Specimen Description   Final    BLOOD LEFT ARM Performed at [facility], [address] [place], [place] [address]    Special Requests   Final    BOTTLES DRAWN AEROBIC ONLY Blood  Culture results may not be optimal due to an inadequate volume of blood received in culture bottles Performed at [facility], [address] [place], [place] [address]    Culture   Final    NO GROWTH < 24 HOURS Performed at [facility], [address] [place], [place] [address]    Report Status PENDING  Incomplete  Culture, blood (Routine X 2) w Reflex to ID Panel     Status: None (Preliminary result)   Collection Time: 02/14/24  4:45 PM   Specimen: BLOOD  Result Value Ref Range Status   Specimen Description   Final    BLOOD Performed at [facility], [address] [place], [place] [address]    Special Requests   Final    BOTTLES DRAWN AEROBIC AND ANAEROBIC Blood Culture results may not be optimal due to an inadequate volume of blood received in culture bottles Performed at [facility], [address] [place], [place] [address]    Culture   Final    NO GROWTH < 24 HOURS Performed at [facility], [address], [place], [place] [address]    Report Status PENDING  Incomplete    Labs: Results for orders placed or performed during the hospital encounter of 02/04/24 (from the past 48 hours)  Glucose, capillary     Status: Abnormal   Collection Time: 02/13/24  5:12 PM  Result Value Ref Range   Glucose-Capillary 178 (H) 70 - 99 mg/dL    Comment: Glucose reference range applies only to samples taken after fasting for at least 8 hours.  CBC     Status: Abnormal   Collection Time: 02/13/24  6:13 PM  Result Value Ref Range   WBC 30.6 (H) 4.0 - 10.5 K/uL   RBC 4.99 3.87 - 5.11 MIL/uL   Hemoglobin 13.3 12.0 - 15.0 g/dL   HCT 02.7 25.3 - 66.4 %   MCV 89.0 80.0 - 100.0 fL   MCH 26.7 26.0 - 34.0 pg   MCHC 30.0 30.0 - 36.0 g/dL   RDW 40.3 47.4 - 25.9 %   Platelets 356 150 - 400 K/uL   nRBC 0.0 0.0 - 0.2 %  Comment: Performed at [facility], [address] [place], [place] [address]   Glucose, capillary     Status: Abnormal   Collection Time: 02/13/24  9:06 PM  Result Value Ref Range   Glucose-Capillary 158 (H) 70 - 99 mg/dL    Comment: Glucose reference range applies only to samples taken after fasting for at least 8 hours.  Comprehensive metabolic panel     Status: Abnormal   Collection Time: 02/14/24  5:22 AM  Result Value Ref Range   Sodium 137 135 - 145 mmol/L    Comment: DELTA CHECK NOTED   Potassium 3.7 3.5 - 5.1 mmol/L   Chloride 103 98 - 111 mmol/L   CO2 23 22 - 32 mmol/L   Glucose, Bld 176 (H) 70 - 99 mg/dL    Comment: Glucose reference range applies only to samples taken after fasting for at least 8 hours.   BUN 27 (H) 8 - 23 mg/dL   Creatinine, Ser 6.04 0.44 - 1.00 mg/dL   Calcium 9.5 8.9 - 54.0 mg/dL   Total Protein 5.4 (L) 6.5 - 8.1 g/dL   Albumin 2.1 (L) 3.5 - 5.0 g/dL   AST 10 (L) 15 - 41 U/L   ALT 15 0 - 44 U/L   Alkaline Phosphatase 80 38 - 126 U/L   Total Bilirubin 0.8 0.0 - 1.2 mg/dL   GFR, Estimated >98 >11 mL/min    Comment: (NOTE) Calculated using the CKD-EPI Creatinine Equation (2021)    Anion gap 11 5 - 15    Comment: Performed at [facility], [address], [place], [place] [address]  CBC with Differential/Platelet     Status: Abnormal   Collection Time: 02/14/24  5:22 AM  Result Value Ref Range   WBC 29.9 (H) 4.0 - 10.5 K/uL   RBC 4.09 3.87 - 5.11 MIL/uL   Hemoglobin 11.1 (L) 12.0 - 15.0 g/dL   HCT 29.5 (L) 62.1 - 30.8 %   MCV 87.8 80.0 - 100.0 fL   MCH 27.1 26.0 - 34.0 pg   MCHC 30.9 30.0 - 36.0 g/dL   RDW 65.7 84.6 - 96.2 %   Platelets 317 150 - 400 K/uL   nRBC 0.0 0.0 - 0.2 %   Neutrophils Relative % 91 %   Neutro Abs 27.3 (H) 1.7 - 7.7 K/uL   Lymphocytes Relative 3 %   Lymphs Abs 0.8 0.7 - 4.0 K/uL   Monocytes Relative 5 %   Monocytes Absolute 1.5 (H) 0.1 - 1.0 K/uL   Eosinophils Relative 0 %   Eosinophils Absolute 0.0 0.0 - 0.5 K/uL   Basophils Relative 0 %   Basophils Absolute 0.1 0.0 - 0.1  K/uL   WBC Morphology MORPHOLOGY UNREMARKABLE    RBC Morphology MORPHOLOGY UNREMARKABLE    Smear Review Normal platelet morphology    Immature Granulocytes 1 %   Abs Immature Granulocytes 0.21 (H) 0.00 - 0.07 K/uL    Comment: Performed at [facility], [address], [place], [place] [address]  Phosphorus     Status: None   Collection Time: 02/14/24  5:22 AM  Result Value Ref Range   Phosphorus 2.5 2.5 - 4.6 mg/dL    Comment: Performed at [facility], [address] [place], [place] [address]  Magnesium     Status: None   Collection Time: 02/14/24  5:22 AM  Result Value Ref Range   Magnesium 1.9 1.7 - 2.4 mg/dL  Comment: Performed at [facility], [address] [place], [place] [address]  Hemoglobin A1c     Status: Abnormal   Collection Time: 02/14/24  5:22 AM  Result Value Ref Range   Hgb A1c MFr Bld 7.7 (H) 4.8 - 5.6 %    Comment: (NOTE) Pre diabetes:          5.7%-6.4%  Diabetes:              >6.4%  Glycemic control for   <7.0% adults with diabetes    Mean Plasma Glucose 174.29 mg/dL    Comment: Performed at [facility], [address], [place], [place] [address]  Glucose, capillary     Status: Abnormal   Collection Time: 02/14/24  7:55 AM  Result Value Ref Range   Glucose-Capillary 167 (H) 70 - 99 mg/dL    Comment: Glucose reference range applies only to samples taken after fasting for at least 8 hours.  Lactic acid, plasma     Status: None   Collection Time: 02/14/24  8:42 AM  Result Value Ref Range   Lactic Acid, Venous 1.1 0.5 - 1.9 mmol/L    Comment: Performed at [facility], [address], [place], [place] [address]  Lactic acid, plasma     Status: None   Collection Time: 02/14/24 11:02 AM  Result Value Ref Range   Lactic Acid, Venous 1.0 0.5 - 1.9 mmol/L    Comment: Performed at [facility], [address] [place], [place] [address]  Glucose,  capillary     Status: Abnormal   Collection Time: 02/14/24 12:29 PM  Result Value Ref Range   Glucose-Capillary 141 (H) 70 - 99 mg/dL    Comment: Glucose reference range applies only to samples taken after fasting for at least 8 hours.  Culture, blood (Routine X 2) w Reflex to ID Panel     Status: None (Preliminary result)   Collection Time: 02/14/24  4:35 PM   Specimen: BLOOD LEFT ARM  Result Value Ref Range   Specimen Description      BLOOD LEFT ARM Performed at [facility], [address] [place], [place] [address]    Special Requests      BOTTLES DRAWN AEROBIC ONLY Blood Culture results may not be optimal due to an inadequate volume of blood received in culture bottles Performed at [facility], [address], [place], [place] [address]    Culture      NO GROWTH < 24 HOURS Performed at [facility], [address], [place], [place] [address]    Report Status PENDING   Culture, blood (Routine X 2) w Reflex to ID Panel     Status: None (Preliminary result)   Collection Time: 02/14/24  4:45 PM   Specimen: BLOOD  Result Value Ref Range   Specimen Description      BLOOD Performed at [facility], [address] [place], [place] [address]    Special Requests      BOTTLES DRAWN AEROBIC AND ANAEROBIC Blood Culture results may not be optimal due to an inadequate volume of blood received in culture bottles Performed at [facility], [address] [place], [place] [address]    Culture      NO GROWTH < 24 HOURS Performed at [facility], [address] [place], [place], [place] [address]    Report Status PENDING   Glucose, capillary  Status: Abnormal   Collection Time: 02/14/24  4:54 PM  Result Value Ref Range   Glucose-Capillary 149 (H) 70 - 99 mg/dL    Comment: Glucose reference range applies only to samples taken after fasting for at least 8 hours.  Urinalysis, Routine w reflex microscopic -Urine,  Catheterized     Status: Abnormal   Collection Time: 02/14/24  6:07 PM  Result Value Ref Range   Color, Urine YELLOW YELLOW   APPearance CLEAR CLEAR   Specific Gravity, Urine >1.046 (H) 1.005 - 1.030   pH 5.0 5.0 - 8.0   Glucose, UA >=500 (A) NEGATIVE mg/dL   Hgb urine dipstick NEGATIVE NEGATIVE   Bilirubin Urine NEGATIVE NEGATIVE   Ketones, ur 20 (A) NEGATIVE mg/dL   Protein, ur 30 (A) NEGATIVE mg/dL   Nitrite NEGATIVE NEGATIVE   Leukocytes,Ua NEGATIVE NEGATIVE   RBC / HPF 0-5 0 - 5 RBC/hpf   WBC, UA 6-10 0 - 5 WBC/hpf   Bacteria, UA NONE SEEN NONE SEEN   Squamous Epithelial / HPF 0-5 0 - 5 /HPF   Mucus PRESENT     Comment: Performed at [facility], [address] [place], [place] [address]  Glucose, capillary     Status: Abnormal   Collection Time: 02/14/24  8:57 PM  Result Value Ref Range   Glucose-Capillary 143 (H) 70 - 99 mg/dL    Comment: Glucose reference range applies only to samples taken after fasting for at least 8 hours.  Glucose, capillary     Status: Abnormal   Collection Time: 02/14/24 11:32 PM  Result Value Ref Range   Glucose-Capillary 124 (H) 70 - 99 mg/dL    Comment: Glucose reference range applies only to samples taken after fasting for at least 8 hours.  Glucose, capillary     Status: Abnormal   Collection Time: 02/15/24  3:47 AM  Result Value Ref Range   Glucose-Capillary 100 (H) 70 - 99 mg/dL    Comment: Glucose reference range applies only to samples taken after fasting for at least 8 hours.  CBC     Status: Abnormal   Collection Time: 02/15/24  4:29 AM  Result Value Ref Range   WBC 29.3 (H) 4.0 - 10.5 K/uL   RBC 3.91 3.87 - 5.11 MIL/uL   Hemoglobin 10.6 (L) 12.0 - 15.0 g/dL   HCT 60.4 (L) 54.0 - 98.1 %   MCV 86.7 80.0 - 100.0 fL   MCH 27.1 26.0 - 34.0 pg   MCHC 31.3 30.0 - 36.0 g/dL   RDW 19.1 47.8 - 29.5 %   Platelets 324 150 - 400 K/uL   nRBC 0.0 0.0 - 0.2 %    Comment: Performed at [facility], [address] [place], [place] [address]  Phosphorus     Status: None   Collection Time: 02/15/24  4:29 AM  Result Value Ref Range   Phosphorus 2.6 2.5 - 4.6 mg/dL    Comment: Performed at [facility], [address] [place], [place] [address]  Magnesium     Status: None   Collection Time: 02/15/24  4:29 AM  Result Value Ref Range   Magnesium 1.9 1.7 - 2.4 mg/dL    Comment: Performed at [facility], [address], [place], [place] [address]  Comprehensive metabolic panel     Status: Abnormal   Collection Time: 02/15/24  4:29 AM  Result Value Ref Range   Sodium 135 135 - 145 mmol/L  Potassium 3.1 (L) 3.5 - 5.1 mmol/L   Chloride 100 98 - 111 mmol/L   CO2 22 22 - 32 mmol/L   Glucose, Bld 106 (H) 70 - 99 mg/dL    Comment: Glucose reference range applies only to samples taken after fasting for at least 8 hours.   BUN 27 (H) 8 - 23 mg/dL   Creatinine, Ser 1.61 0.44 - 1.00 mg/dL   Calcium 9.5 8.9 - 09.6 mg/dL   Total Protein 5.2 (L) 6.5 - 8.1 g/dL   Albumin 2.0 (L) 3.5 - 5.0 g/dL   AST 10 (L) 15 - 41 U/L   ALT 15 0 - 44 U/L   Alkaline Phosphatase 80 38 - 126 U/L   Total Bilirubin 0.8 0.0 - 1.2 mg/dL   GFR, Estimated >04 >54 mL/min    Comment: (NOTE) Calculated using the CKD-EPI Creatinine Equation (2021)    Anion gap 13 5 - 15    Comment: Performed at [facility], [address], [place], [place] [address]  Glucose, capillary     Status: Abnormal   Collection Time: 02/15/24  7:40 AM  Result Value Ref Range   Glucose-Capillary 143 (H) 70 - 99 mg/dL    Comment: Glucose reference range applies only to samples taken after fasting for at least 8 hours.    Imaging / Studies: DG Abd 1 View Result Date: 02/14/2024 CLINICAL DATA:  Enteric tube placement. EXAM: ABDOMEN - 1 VIEW COMPARISON:  Same day abdominal radiograph. FINDINGS: An enteric tube terminates in the stomach. IMPRESSION: Enteric tube terminates in the stomach.  Electronically Signed   By: Romona Curls M.D.   On: 02/14/2024 16:56   CT ABDOMEN PELVIS W CONTRAST Result Date: 02/14/2024 CLINICAL DATA:  Abdominal pain. Peritonitis or perforation suspected. Past medical history includes pancreatic mass. Esophageal cancer. 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 Biliary obstruction. * Tracking Code: BO * EXAM: CT ABDOMEN AND PELVIS WITH CONTRAST TECHNIQUE: Multidetector CT imaging of the abdomen and pelvis was performed using the standard protocol following bolus administration of intravenous contrast. RADIATION DOSE REDUCTION: This exam was performed according to the departmental dose-optimization program which includes automated exposure control, adjustment of the mA and/or kV according to patient size and/or use of iterative reconstruction technique. CONTRAST:  30mL OMNIPAQUE IOHEXOL 300 MG/ML SOLN, OMNIPAQUE IOHEXOL 300 MG/ML SOLN COMPARISON:  02/10/2024 FINDINGS: Lower chest: Emphysema. Motion degradation at the lung bases. Bibasilar scarring. Normal heart size. Trace left pleural fluid. Multivessel coronary artery atherosclerosis. Hepatobiliary: No suspicious liver lesion. Moderate pneumobilia within the left hepatic lobe. moderate intrahepatic biliary duct dilatation. 2 common duct stents are unchanged in position. These originate in the hepatic hilum and just above the pancreatic head. Terminate within the descending duodenum. On coronal reformats, the common duct is dilated at up to 1.6 cm above the proximal most stent (39/5). Especially when compared to the exam of 01/13/2024, the stents have been repositioned or migrated caudally today. Pancreas: Pancreatic marked parenchymal atrophy with moderate duct dilatation, similar at 8 mm. Followed to the level of the ampulla. residual cystic lesion within the pancreatic head at 3.2 x 3.6 cm and 41/2. 4.1 x 3.3 cm back on 10/28/2023 CT. Spleen: Normal in size, without focal abnormality.  Adrenals/Urinary Tract: Normal adrenal glands. Normal kidneys, without hydronephrosis. The bladder is mildly distended and demonstrates hyperattenuation within, most likely residual contrast. Stomach/Bowel: Borderline gastric distension. Scattered colonic diverticula.  Normal terminal ileum. Small bowel loops measure up to 3.7 cm versus  4.3 cm at the same level on the prior. No focal transition identified. No pneumatosis or free intraperitoneal air. Vascular/Lymphatic: Advanced aortic and branch vessel atherosclerosis. No abdominopelvic adenopathy. Reproductive: Hysterectomy.  No adnexal mass. Other: No significant free fluid. Musculoskeletal: Dorsal spinal stimulator with battery pack about the low lateral right pelvic wall. Osteopenia. Left hip arthroplasty. Lumbosacral spondylosis. Convex right thoracolumbar spine curvature. IMPRESSION: 1. Common duct stents in place with pneumobilia and biliary duct dilatation. Especially when compared to 01/13/2024, the stents have been repositioned or migrated distally and the common duct dilatation above the stents is new. Consider ERCP/stent repositioning. 2. Similar pancreatic duct dilatation and upstream atrophy presumably secondary to a pancreatic head/periampullary cystic lesion which is decreased in size compared to 10/28/2023. This likely represents the intraductal papillary mucinous tumor described in EMR. 3. Improved small bowel distension without focal transition point. Favor adynamic ileus versus low-grade partial small bowel obstruction. 4. Incidental findings, including: Coronary artery atherosclerosis. Aortic Atherosclerosis (ICD10-I70.0). Emphysema (ICD10-J43.9). Electronically Signed   By: Jeronimo Greaves M.D.   On: 02/14/2024 14:45   [place] EKG SITE RITE Result Date: 02/14/2024 If Site Rite image not attached, placement could not be confirmed due to current cardiac rhythm.  DG Abd 1 View Result Date: 02/14/2024 CLINICAL DATA:  Enteric tube placement. EXAM:  ABDOMEN - 1 VIEW COMPARISON:  Abdominal radiograph performed the same day. FINDINGS: An enteric tube reaches the lower esophagus, but appears coiled on itself. Common bile duct stents are noted. Spinal stimulator leads are partially imaged. IMPRESSION: Enteric tube reaches the lower esophagus, but appears coiled on itself. This enteric tube does not appear to enter the stomach. Electronically Signed   By: Romona Curls M.D.   On: 02/14/2024 13:44   DG Abd Portable 1V Result Date: 02/14/2024 CLINICAL DATA:  Nasogastric tube placement. EXAM: PORTABLE ABDOMEN - 1 VIEW COMPARISON:  Abdominal radiograph yesterday FINDINGS: Tip and side port of the enteric tube below the diaphragm in the stomach. Plastic biliary stent unchanged in position by radiograph. There is gaseous distention of bowel loops in the upper abdomen. Right upper quadrant surgical clips. Spinal stimulator in place. IMPRESSION: Tip and side port of the enteric tube below the diaphragm in the stomach. Electronically Signed   By: Narda Rutherford M.D.   On: 02/14/2024 12:24   DG Abd Portable 1V Result Date: 02/13/2024 CLINICAL DATA:  NG tube placement EXAM: PORTABLE ABDOMEN - 1 VIEW COMPARISON:  Earlier today at 7:57 a.m. FINDINGS: Nasogastric tube terminates at the gastric body with side port well below the gastroesophageal junction. Dorsal spinal stimulator. Common duct stent. Cholecystectomy. No Cherry Wittwer free intraperitoneal air. Prominent gas-filled loops of upper abdominal small bowel are relatively similar. Contrast remains within the stomach. IMPRESSION: Nasogastric tube appropriately positioned. Electronically Signed   By: Jeronimo Greaves M.D.   On: 02/13/2024 19:49    Medications / Allergies: per chart  Antibiotics: Anti-infectives (From admission, onward)    Start     Dose/Rate Route Frequency Ordered Stop   02/14/24 1000  piperacillin-tazobactam (ZOSYN) IVPB 3.375 g        3.375 g 12.5 mL/hr over 240 Minutes Intravenous Every 8 hours  02/14/24 0912           Note: Portions of this report may have been transcribed using voice recognition software. Every effort was made to ensure accuracy; however, inadvertent computerized transcription errors may be present.   Any transcriptional errors that result from this process are unintentional.    Ardeth Sportsman,  MD, FACS, MASCRS Esophageal, Gastrointestinal & Colorectal Surgery Robotic and Minimally Invasive Surgery  [facility] [address] [place], [place] [address] [phone] Fax [phone] Main  CONTACT INFORMATION: Weekday (9AM-5PM): Call [facility] main office at [phone] Weeknight (5PM-9AM) or Weekend/Holiday: Check EPIC "Web Links" tab & use "AMION" (password " [email or web address]") for General Surgery [facility] coverage  Please, DO NOT use SecureChat  (it is not reliable communication to reach operating surgeons & will lead to a delay in care).   Epic staff messaging available for outptient concerns needing 1-2 business day response.      02/15/2024  9:28 AM

## 2024-02-16 DIAGNOSIS — R1031 Right lower quadrant pain: Secondary | ICD-10-CM | POA: Diagnosis not present

## 2024-02-16 DIAGNOSIS — K831 Obstruction of bile duct: Secondary | ICD-10-CM | POA: Diagnosis not present

## 2024-02-16 DIAGNOSIS — D72829 Elevated white blood cell count, unspecified: Secondary | ICD-10-CM | POA: Diagnosis not present

## 2024-02-16 DIAGNOSIS — K567 Ileus, unspecified: Secondary | ICD-10-CM | POA: Diagnosis not present

## 2024-02-16 DIAGNOSIS — E440 Moderate protein-calorie malnutrition: Secondary | ICD-10-CM | POA: Insufficient documentation

## 2024-02-16 DIAGNOSIS — K5669 Other partial intestinal obstruction: Secondary | ICD-10-CM

## 2024-02-16 LAB — GLUCOSE, CAPILLARY
Glucose-Capillary: 192 mg/dL — ABNORMAL HIGH (ref 70–99)
Glucose-Capillary: 203 mg/dL — ABNORMAL HIGH (ref 70–99)
Glucose-Capillary: 208 mg/dL — ABNORMAL HIGH (ref 70–99)
Glucose-Capillary: 209 mg/dL — ABNORMAL HIGH (ref 70–99)
Glucose-Capillary: 223 mg/dL — ABNORMAL HIGH (ref 70–99)
Glucose-Capillary: 245 mg/dL — ABNORMAL HIGH (ref 70–99)

## 2024-02-16 LAB — COMPREHENSIVE METABOLIC PANEL
ALT: 15 U/L (ref 0–44)
AST: 12 U/L — ABNORMAL LOW (ref 15–41)
Albumin: 1.7 g/dL — ABNORMAL LOW (ref 3.5–5.0)
Alkaline Phosphatase: 70 U/L (ref 38–126)
Anion gap: 6 (ref 5–15)
BUN: 25 mg/dL — ABNORMAL HIGH (ref 8–23)
CO2: 27 mmol/L (ref 22–32)
Calcium: 9 mg/dL (ref 8.9–10.3)
Chloride: 102 mmol/L (ref 98–111)
Creatinine, Ser: 0.47 mg/dL (ref 0.44–1.00)
GFR, Estimated: >60 mL/min (ref >60)
Glucose, Bld: 193 mg/dL — ABNORMAL HIGH (ref 70–99)
Potassium: 3 mmol/L — ABNORMAL LOW (ref 3.5–5.1)
Sodium: 135 mmol/L (ref 135–145)
Total Bilirubin: 0.5 mg/dL (ref 0.0–1.2)
Total Protein: 4.7 g/dL — ABNORMAL LOW (ref 6.5–8.1)

## 2024-02-16 LAB — TRIGLYCERIDES
Triglycerides: 77 mg/dL (ref <150)
Triglycerides: 97 mg/dL (ref <150)

## 2024-02-16 LAB — CBC WITH DIFFERENTIAL/PLATELET
Abs Immature Granulocytes: 0.14 10*3/uL — ABNORMAL HIGH (ref 0.00–0.07)
Basophils Absolute: 0 10*3/uL (ref 0.0–0.1)
Basophils Relative: 0 %
Eosinophils Absolute: 0 10*3/uL (ref 0.0–0.5)
Eosinophils Relative: 0 %
HCT: 29.9 % — ABNORMAL LOW (ref 36.0–46.0)
Hemoglobin: 9.4 g/dL — ABNORMAL LOW (ref 12.0–15.0)
Immature Granulocytes: 1 %
Lymphocytes Relative: 7 %
Lymphs Abs: 1.3 10*3/uL (ref 0.7–4.0)
MCH: 27 pg (ref 26.0–34.0)
MCHC: 31.4 g/dL (ref 30.0–36.0)
MCV: 85.9 fL (ref 80.0–100.0)
Monocytes Absolute: 0.8 10*3/uL (ref 0.1–1.0)
Monocytes Relative: 4 %
Neutro Abs: 17.7 10*3/uL — ABNORMAL HIGH (ref 1.7–7.7)
Neutrophils Relative %: 88 %
Platelets: 270 10*3/uL (ref 150–400)
RBC: 3.48 MIL/uL — ABNORMAL LOW (ref 3.87–5.11)
RDW: 14.8 % (ref 11.5–15.5)
WBC: 20 10*3/uL — ABNORMAL HIGH (ref 4.0–10.5)
nRBC: 0 % (ref 0.0–0.2)

## 2024-02-16 LAB — LACTIC ACID, PLASMA: Lactic Acid, Venous: 1.1 mmol/L (ref 0.5–1.9)

## 2024-02-16 LAB — MAGNESIUM: Magnesium: 2.4 mg/dL (ref 1.7–2.4)

## 2024-02-16 LAB — PHOSPHORUS: Phosphorus: 1.9 mg/dL — ABNORMAL LOW (ref 2.5–4.6)

## 2024-02-16 MED ORDER — POTASSIUM CHLORIDE 2 MEQ/ML IV SOLN
INTRAVENOUS | Status: AC
  Filled 2024-02-16 (×2): qty 1000

## 2024-02-16 MED ORDER — MAGNESIUM FOR TPN
INJECTION | INTRAVENOUS | Status: AC
  Filled 2024-02-16: qty 480

## 2024-02-16 MED ORDER — MELATONIN 3 MG PO TABS
3.0000 mg | ORAL_TABLET | Freq: Once | ORAL | Status: AC
Start: 2024-02-16 — End: 2024-02-16
  Administered 2024-02-16: 3 mg via ORAL
  Filled 2024-02-16: qty 1

## 2024-02-16 MED ORDER — POTASSIUM PHOSPHATES 15 MMOLE/5ML IV SOLN
30.0000 mmol | Freq: Once | INTRAVENOUS | Status: AC
  Administered 2024-02-16: 30 mmol via INTRAVENOUS
  Filled 2024-02-16: qty 10

## 2024-02-16 NOTE — Assessment & Plan Note (Addendum)
 Glucose slightly elevated - Continue sliding scale corrections -hold Jardiance

## 2024-02-16 NOTE — Progress Notes (Signed)
 PHARMACY - TOTAL PARENTERAL NUTRITION CONSULT NOTE   Indication:  ileus in setting of chronic constipation  Patient Measurements: Height: 5\' 4"  (162.6 cm) Weight: 63.3 kg (139 lb 8.8 oz) IBW/kg (Calculated) : 54.7 TPN AdjBW (KG): 59 Body mass index is 23.95 kg/m. Usual Weight: 22 lb weight loss in last 3 months  Assessment: 89 year old F with PMH of esophageal cancer, chronic calcific pancreatitis, pancreatic insufficiency, biliary stones s/p biliary stents in 10/2023, HTN, HLD, GERD, DM-2 and OSA who presented with intermittent RUQ pain and unintentional weight loss since mid January and admitted with ileus surgery and GI following, to start TPN  Glucose / Insulin: goal < 180 - CBGs 133-223 Electrolytes:  - K+ low at 3.0  - Phos low at 1.9  - Corrected calcium is elevated at 10.8  - Othere electrolytes are WNL     Renal:  - SCr <1 - BUN stable at 25  Hepatic:  - LFTs  WNL - alk Phos WNL - Tbili WNL Intake / Output; MIVF: LR with 20 mEq KCL at 60 ml/hr GI Imaging: GI Surgeries / Procedures:   Central access: 3/2 PICC placed TPN start date: 3/2  Nutritional Goals: Goal TPN rate is 65 mL/hr (provides 78 g of protein and 1575 kcals per day)  RD Assessment: Estimated Needs Total Energy Estimated Needs: 1500-1750 kcals Total Protein Estimated Needs: 75-90 grams Total Fluid Estimated Needs: >/= 1.5L  Current Nutrition:  NPO  Plan:  Now: Potassium phosphate 30 mmol IV x1     At 1800: Keep  TPN at 27mL/hr at 1800 to ensure no refeeding syndrome as several labs dropped after start of TPN  Electrolytes in TPN:  Na 74mEq/L K Inc to 75 mEq/L Ca Dec to 0 mEq/L  Mg 45mEq/L  Phos inc to 20 mmol/L.  Cl:Ac 1:1 Add standard MVI and trace elements to TPN Continue Moderate q4h SSI and adjust as needed. IF CBG remain elevated will increase Thiamine 100mg  daily x 5 days per RD rec's through 3/6 Continue MIVF to 60 mL/hr at 1800 Monitor TPN labs on Mon/Thurs and prn BMP,  magnesium, phosphorus with AM labs      Adalberto Cole, PharmD, BCPS 02/16/2024 11:00 AM

## 2024-02-16 NOTE — Assessment & Plan Note (Signed)
Nutrition Status: Nutrition Problem: Severe Malnutrition Etiology: chronic illness (DM, TBI) Signs/Symptoms: severe fat depletion, severe muscle depletion, percent weight loss Interventions: Glucerna shake, MVI    

## 2024-02-16 NOTE — Plan of Care (Signed)
  Problem: Education: Goal: Knowledge of General Education information will improve Description: Including pain rating scale, medication(s)/side effects and non-pharmacologic comfort measures 02/16/2024 0236 by Ailene Rud, RN Outcome: Progressing 02/16/2024 0236 by Ailene Rud, RN Outcome: Progressing   Problem: Health Behavior/Discharge Planning: Goal: Ability to manage health-related needs will improve Outcome: Progressing   Problem: Clinical Measurements: Goal: Ability to maintain clinical measurements within normal limits will improve Outcome: Progressing Goal: Will remain free from infection Outcome: Progressing Goal: Diagnostic test results will improve Outcome: Progressing Goal: Respiratory complications will improve Outcome: Progressing Goal: Cardiovascular complication will be avoided Outcome: Progressing   Problem: Activity: Goal: Risk for activity intolerance will decrease Outcome: Progressing   Problem: Nutrition: Goal: Adequate nutrition will be maintained Outcome: Progressing   Problem: Coping: Goal: Level of anxiety will decrease Outcome: Progressing   Problem: Elimination: Goal: Will not experience complications related to bowel motility Outcome: Progressing Goal: Will not experience complications related to urinary retention Outcome: Progressing   Problem: Pain Managment: Goal: General experience of comfort will improve and/or be controlled Outcome: Progressing   Problem: Safety: Goal: Ability to remain free from injury will improve Outcome: Progressing   Problem: Skin Integrity: Goal: Risk for impaired skin integrity will decrease Outcome: Progressing   Problem: Education: Goal: Ability to describe self-care measures that may prevent or decrease complications (Diabetes Survival Skills Education) will improve Outcome: Progressing Goal: Individualized Educational Video(s) Outcome: Progressing   Problem: Coping: Goal: Ability to adjust  to condition or change in health will improve Outcome: Progressing   Problem: Fluid Volume: Goal: Ability to maintain a balanced intake and output will improve Outcome: Progressing   Problem: Health Behavior/Discharge Planning: Goal: Ability to identify and utilize available resources and services will improve Outcome: Progressing Goal: Ability to manage health-related needs will improve Outcome: Progressing   Problem: Metabolic: Goal: Ability to maintain appropriate glucose levels will improve Outcome: Progressing   Problem: Nutritional: Goal: Maintenance of adequate nutrition will improve Outcome: Progressing Goal: Progress toward achieving an optimal weight will improve Outcome: Progressing   Problem: Skin Integrity: Goal: Risk for impaired skin integrity will decrease Outcome: Progressing   Problem: Tissue Perfusion: Goal: Adequacy of tissue perfusion will improve Outcome: Progressing

## 2024-02-16 NOTE — Progress Notes (Signed)
 Subjective/Chief Complaint: Reports her abdominal pain is the same - no better, no worse compared to the day of admission. Reports flatus and a couple of small BMs.    Objective: Vital signs in last 24 hours: Temp:  [98 F (36.7 C)-98.2 F (36.8 C)] 98.2 F (36.8 C) (03/03 1307) Pulse Rate:  [67-75] 67 (03/03 1307) Resp:  [16-18] 16 (03/03 0412) BP: (80-194)/(31-108) 113/45 (03/03 1307) SpO2:  [92 %-97 %] 92 % (03/03 1307) Weight:  [63.3 kg] 63.3 kg (03/03 0500) Last BM Date : 02/15/24  Intake/Output from previous day: 03/02 0701 - 03/03 0700 In: 1685.5 [I.V.:736.1; IV Piggyback:949.4] Out: 1251 [Urine:600; Emesis/NG output:650; Stool:1] Intake/Output this shift: Total I/O In: -  Out: 300 [Urine:300]  General appearance: alert and cooperative Resp: clear to auscultation bilaterally Cardio: regular rate and rhythm GI: soft, overall non-distended,  tender to light palpation RLQ and periumbilical region, no peritonitis, no guarding   Lab Results:  Recent Labs    02/15/24 0429 02/16/24 0216  WBC 29.3* 20.0*  HGB 10.6* 9.4*  HCT 33.9* 29.9*  PLT 324 270   BMET Recent Labs    02/15/24 0429 02/16/24 0216  NA 135 135  K 3.1* 3.0*  CL 100 102  CO2 22 27  GLUCOSE 106* 193*  BUN 27* 25*  CREATININE 0.49 0.47  CALCIUM 9.5 9.0   PT/INR No results for input(s): "LABPROT", "INR" in the last 72 hours. ABG No results for input(s): "PHART", "HCO3" in the last 72 hours.  Invalid input(s): "PCO2", "PO2"  Studies/Results: DG Chest Port 1 View Result Date: 02/15/2024 CLINICAL DATA:  Short of breath, small-bowel obstruction, emphysema EXAM: PORTABLE CHEST 1 VIEW COMPARISON:  07/27/2023 FINDINGS: Single frontal view of the chest demonstrates an unremarkable cardiac silhouette. Enteric catheter passes below diaphragm, tip excluded by collimation but side port projecting over the gastric body. Right-sided PICC tip overlies superior vena cava. Stable spinal stimulator leads  over the midthoracic central canal. No airspace disease, effusion, or pneumothorax. No acute bony abnormalities. IMPRESSION: 1. Support devices as above. 2. No acute intrathoracic process. Electronically Signed   By: Sharlet Salina M.D.   On: 02/15/2024 14:34   DG Abd 1 View Result Date: 02/14/2024 CLINICAL DATA:  Enteric tube placement. EXAM: ABDOMEN - 1 VIEW COMPARISON:  Same day abdominal radiograph. FINDINGS: An enteric tube terminates in the stomach. IMPRESSION: Enteric tube terminates in the stomach. Electronically Signed   By: Romona Curls M.D.   On: 02/14/2024 16:56   CT ABDOMEN PELVIS W CONTRAST Result Date: 02/14/2024 CLINICAL DATA:  Abdominal pain. Peritonitis or perforation suspected. Past medical history includes pancreatic mass. Esophageal cancer. 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 Biliary obstruction. * Tracking Code: BO * EXAM: CT ABDOMEN AND PELVIS WITH CONTRAST TECHNIQUE: Multidetector CT imaging of the abdomen and pelvis was performed using the standard protocol following bolus administration of intravenous contrast. RADIATION DOSE REDUCTION: This exam was performed according to the departmental dose-optimization program which includes automated exposure control, adjustment of the mA and/or kV according to patient size and/or use of iterative reconstruction technique. CONTRAST:  30mL OMNIPAQUE IOHEXOL 300 MG/ML SOLN, OMNIPAQUE IOHEXOL 300 MG/ML SOLN COMPARISON:  02/10/2024 FINDINGS: Lower chest: Emphysema. Motion degradation at the lung bases. Bibasilar scarring. Normal heart size. Trace left pleural fluid. Multivessel coronary artery atherosclerosis. Hepatobiliary: No suspicious liver lesion. Moderate pneumobilia within the left hepatic lobe. moderate intrahepatic biliary duct dilatation. 2 common duct stents are unchanged in position. These originate in the hepatic  hilum and just above the pancreatic head. Terminate within the descending duodenum. On coronal  reformats, the common duct is dilated at up to 1.6 cm above the proximal most stent (39/5). Especially when compared to the exam of 01/13/2024, the stents have been repositioned or migrated caudally today. Pancreas: Pancreatic marked parenchymal atrophy with moderate duct dilatation, similar at 8 mm. Followed to the level of the ampulla. residual cystic lesion within the pancreatic head at 3.2 x 3.6 cm and 41/2. 4.1 x 3.3 cm back on 10/28/2023 CT. Spleen: Normal in size, without focal abnormality. Adrenals/Urinary Tract: Normal adrenal glands. Normal kidneys, without hydronephrosis. The bladder is mildly distended and demonstrates hyperattenuation within, most likely residual contrast. Stomach/Bowel: Borderline gastric distension. Scattered colonic diverticula.  Normal terminal ileum. Small bowel loops measure up to 3.7 cm versus 4.3 cm at the same level on the prior. No focal transition identified. No pneumatosis or free intraperitoneal air. Vascular/Lymphatic: Advanced aortic and branch vessel atherosclerosis. No abdominopelvic adenopathy. Reproductive: Hysterectomy.  No adnexal mass. Other: No significant free fluid. Musculoskeletal: Dorsal spinal stimulator with battery pack about the low lateral right pelvic wall. Osteopenia. Left hip arthroplasty. Lumbosacral spondylosis. Convex right thoracolumbar spine curvature. IMPRESSION: 1. Common duct stents in place with pneumobilia and biliary duct dilatation. Especially when compared to 01/13/2024, the stents have been repositioned or migrated distally and the common duct dilatation above the stents is new. Consider ERCP/stent repositioning. 2. Similar pancreatic duct dilatation and upstream atrophy presumably secondary to a pancreatic head/periampullary cystic lesion which is decreased in size compared to 10/28/2023. This likely represents the intraductal papillary mucinous tumor described in EMR. 3. Improved small bowel distension without focal transition point.  Favor adynamic ileus versus low-grade partial small bowel obstruction. 4. Incidental findings, including: Coronary artery atherosclerosis. Aortic Atherosclerosis (ICD10-I70.0). Emphysema (ICD10-J43.9). Electronically Signed   By: Jeronimo Greaves M.D.   On: 02/14/2024 14:45   [place] EKG SITE RITE Result Date: 02/14/2024 If Site Rite image not attached, placement could not be confirmed due to current cardiac rhythm.   Anti-infectives: Anti-infectives (From admission, onward)    Start     Dose/Rate Route Frequency Ordered Stop   02/14/24 1000  piperacillin-tazobactam (ZOSYN) IVPB 3.375 g        3.375 g 12.5 mL/hr over 240 Minutes Intravenous Every 8 hours 02/14/24 0912         Assessment/Plan: s/p * No surgery found * Partial sbo persistent Recheck abd xray SBO vs ileus in setting of Chronic constipation  -  CT scan of the abdomen and pelvis showed SBO with possible transition point in RLQ. No pneumoperitoneum or signs of bowel ischemia on CT. Visible biliary stents noted in place from her history of choledocholithiasis 10/2023. Gallbladder surgically absent.  - SBO protocol 2/21 - contrast passed into colon, NG removed, diet ordered - NG tube replaced 2/28 for distention, pain, vomiting  - patient also has chronic pain and constipation.  - CT san repeated 3/1 shows improved small bowel distention with adynamic ileus vs pSBO - no transition point - having bowel function, GI following for possible ileus, clamp NG tube and trial CLD  FEN: NPO, clamp NG, allow clears and see if tolerates QVZ:DGLO ID: no current abx   - per TRH -  HTN HLD OSA Hx if uterine cancer Hx of esophageal cancer GERD T2DM  LOS: 12 days    Adam Phenix 02/16/2024

## 2024-02-16 NOTE — Hospital Course (Signed)
 89 y.o. F with esophageal CA in remission, hx PE, hx biliary stones s/p stent and ERCP sphincterotomy, hx chronic pancreatitis, hx chronic pain on MS Contin, DM, OSA who presented with abdominal pain, found to have ileus.  Gen Surg and GI consulted.

## 2024-02-16 NOTE — Assessment & Plan Note (Signed)
-   Hold home MS Contin - Stop hydromorphone and trial low dose MSIR - Resume mirtazapine

## 2024-02-16 NOTE — Plan of Care (Signed)

## 2024-02-16 NOTE — Assessment & Plan Note (Signed)
 Admitted and NG tube placed.  General surgery and GI consulted.  Small bowel protocol showed passing to the colon on hospital day 2 (2/21), so NG removed and she was started on a diet.  She had a slow and poor recovery.  Had clinical worsening by HD#9 (2/28), so the NG tube was replaced.  CT scan after that on HD#10 (3/1) showed improvement, no transition point.  TPN started 3/1  BM overnight tonight, so Gen Surg have clamped tube. Overall, suspicion is that this is an adynamic ileus from opiates. - Continue TPN for now - ADAT

## 2024-02-16 NOTE — Assessment & Plan Note (Signed)
 Blood pressure normal - Hold home valsartan while NG tube in place - As needed hydralazine IV for severe range pressures

## 2024-02-16 NOTE — Progress Notes (Addendum)
 Progress Note   LOS: 12 days   Chief Complaint: Ileus versus PSBO   Subjective   Patient states she feels better compared to yesterday.  Nurse notes that she passed for very large bowel movements yesterday and had a small bowel movement this morning.  She has improvement in her abdominal pain.  Continues with NG tube.  300cc output yesterday and 450cc this morning (100cc during my exam after cannister was emptied for AM)   Objective   Vital signs in last 24 hours: Temp:  [98 F (36.7 C)-98.1 F (36.7 C)] 98 F (36.7 C) (03/03 0412) Pulse Rate:  [69-75] 69 (03/03 0412) Resp:  [16-18] 16 (03/03 0412) BP: (80-194)/(31-108) 116/36 (03/03 0412) SpO2:  [92 %-97 %] 92 % (03/03 0412) Weight:  [63.3 kg] 63.3 kg (03/03 0500) Last BM Date : 02/15/24 Last BM recorded by nurses in past 5 days Stool Type: Type 7 (Liquid consistency with no solid pieces) (02/15/2024 12:00 PM)  General:   female in no acute distress Heart:  Regular rate and rhythm; no murmurs Pulm: Clear anteriorly; no wheezing Abdomen: soft, nondistended, normal bowel sounds in all quadrants.  RLQ tenderness near spinal stimulator. No organomegaly appreciated. Extremities:  No edema Neurologic:  Alert and  oriented x4;  No focal deficits.  Psych:  Cooperative. Normal mood and affect.  Intake/Output from previous day: 03/02 0701 - 03/03 0700 In: 1685.5 [I.V.:736.1; IV Piggyback:949.4] Out: 951 [Urine:600; Emesis/NG output:350; Stool:1] Intake/Output this shift: Total I/O In: -  Out: 300 [Urine:300]  Studies/Results: DG Chest Port 1 View Result Date: 02/15/2024 CLINICAL DATA:  Short of breath, small-bowel obstruction, emphysema EXAM: PORTABLE CHEST 1 VIEW COMPARISON:  07/27/2023 FINDINGS: Single frontal view of the chest demonstrates an unremarkable cardiac silhouette. Enteric catheter passes below diaphragm, tip excluded by collimation but side port projecting over the gastric body. Right-sided PICC tip overlies  superior vena cava. Stable spinal stimulator leads over the midthoracic central canal. No airspace disease, effusion, or pneumothorax. No acute bony abnormalities. IMPRESSION: 1. Support devices as above. 2. No acute intrathoracic process. Electronically Signed   By: Sharlet Salina M.D.   On: 02/15/2024 14:34   DG Abd 1 View Result Date: 02/14/2024 CLINICAL DATA:  Enteric tube placement. EXAM: ABDOMEN - 1 VIEW COMPARISON:  Same day abdominal radiograph. FINDINGS: An enteric tube terminates in the stomach. IMPRESSION: Enteric tube terminates in the stomach. Electronically Signed   By: Romona Curls M.D.   On: 02/14/2024 16:56   CT ABDOMEN PELVIS W CONTRAST Result Date: 02/14/2024 CLINICAL DATA:  Abdominal pain. Peritonitis or perforation suspected. Past medical history includes pancreatic mass. Esophageal cancer. 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 Biliary obstruction. * Tracking Code: BO * EXAM: CT ABDOMEN AND PELVIS WITH CONTRAST TECHNIQUE: Multidetector CT imaging of the abdomen and pelvis was performed using the standard protocol following bolus administration of intravenous contrast. RADIATION DOSE REDUCTION: This exam was performed according to the departmental dose-optimization program which includes automated exposure control, adjustment of the mA and/or kV according to patient size and/or use of iterative reconstruction technique. CONTRAST:  30mL OMNIPAQUE IOHEXOL 300 MG/ML SOLN, OMNIPAQUE IOHEXOL 300 MG/ML SOLN COMPARISON:  02/10/2024 FINDINGS: Lower chest: Emphysema. Motion degradation at the lung bases. Bibasilar scarring. Normal heart size. Trace left pleural fluid. Multivessel coronary artery atherosclerosis. Hepatobiliary: No suspicious liver lesion. Moderate pneumobilia within the left hepatic lobe. moderate intrahepatic biliary duct dilatation. 2 common duct stents are unchanged in position. These originate in the hepatic hilum  and just above the pancreatic head.  Terminate within the descending duodenum. On coronal reformats, the common duct is dilated at up to 1.6 cm above the proximal most stent (39/5). Especially when compared to the exam of 01/13/2024, the stents have been repositioned or migrated caudally today. Pancreas: Pancreatic marked parenchymal atrophy with moderate duct dilatation, similar at 8 mm. Followed to the level of the ampulla. residual cystic lesion within the pancreatic head at 3.2 x 3.6 cm and 41/2. 4.1 x 3.3 cm back on 10/28/2023 CT. Spleen: Normal in size, without focal abnormality. Adrenals/Urinary Tract: Normal adrenal glands. Normal kidneys, without hydronephrosis. The bladder is mildly distended and demonstrates hyperattenuation within, most likely residual contrast. Stomach/Bowel: Borderline gastric distension. Scattered colonic diverticula.  Normal terminal ileum. Small bowel loops measure up to 3.7 cm versus 4.3 cm at the same level on the prior. No focal transition identified. No pneumatosis or free intraperitoneal air. Vascular/Lymphatic: Advanced aortic and branch vessel atherosclerosis. No abdominopelvic adenopathy. Reproductive: Hysterectomy.  No adnexal mass. Other: No significant free fluid. Musculoskeletal: Dorsal spinal stimulator with battery pack about the low lateral right pelvic wall. Osteopenia. Left hip arthroplasty. Lumbosacral spondylosis. Convex right thoracolumbar spine curvature. IMPRESSION: 1. Common duct stents in place with pneumobilia and biliary duct dilatation. Especially when compared to 01/13/2024, the stents have been repositioned or migrated distally and the common duct dilatation above the stents is new. Consider ERCP/stent repositioning. 2. Similar pancreatic duct dilatation and upstream atrophy presumably secondary to a pancreatic head/periampullary cystic lesion which is decreased in size compared to 10/28/2023. This likely represents the intraductal papillary mucinous tumor described in EMR. 3. Improved  small bowel distension without focal transition point. Favor adynamic ileus versus low-grade partial small bowel obstruction. 4. Incidental findings, including: Coronary artery atherosclerosis. Aortic Atherosclerosis (ICD10-I70.0). Emphysema (ICD10-J43.9). Electronically Signed   By: Jeronimo Greaves M.D.   On: 02/14/2024 14:45   [place] EKG SITE RITE Result Date: 02/14/2024 If Site Rite image not attached, placement could not be confirmed due to current cardiac rhythm.  DG Abd 1 View Result Date: 02/14/2024 CLINICAL DATA:  Enteric tube placement. EXAM: ABDOMEN - 1 VIEW COMPARISON:  Abdominal radiograph performed the same day. FINDINGS: An enteric tube reaches the lower esophagus, but appears coiled on itself. Common bile duct stents are noted. Spinal stimulator leads are partially imaged. IMPRESSION: Enteric tube reaches the lower esophagus, but appears coiled on itself. This enteric tube does not appear to enter the stomach. Electronically Signed   By: Romona Curls M.D.   On: 02/14/2024 13:44    Lab Results: Recent Labs    02/14/24 0522 02/15/24 0429 02/16/24 0216  WBC 29.9* 29.3* 20.0*  HGB 11.1* 10.6* 9.4*  HCT 35.9* 33.9* 29.9*  PLT 317 324 270   BMET Recent Labs    02/14/24 0522 02/15/24 0429 02/16/24 0216  NA 137 135 135  K 3.7 3.1* 3.0*  CL 103 100 102  CO2 23 22 27   GLUCOSE 176* 106* 193*  BUN 27* 27* 25*  CREATININE 0.54 0.49 0.47  CALCIUM 9.5 9.5 9.0   LFT Recent Labs    02/16/24 0216  PROT 4.7*  ALBUMIN 1.7*  AST 12*  ALT 15  ALKPHOS 70  BILITOT 0.5   PT/INR No results for input(s): "LABPROT", "INR" in the last 72 hours.   Scheduled Meds:  bisacodyl  10 mg Rectal Daily   Chlorhexidine Gluconate Cloth  6 each Topical Daily   insulin aspart  0-15 Units Subcutaneous Q4H  latanoprost  1 drop Both Eyes QHS   lipase/protease/amylase)  20,880 Units Per Tube Once   And   sodium bicarbonate  650 mg Per Tube Once   pantoprazole (PROTONIX) IV  40 mg Intravenous QHS    sucralfate  1 g Oral BID   thiamine (VITAMIN B1) injection  100 mg Intravenous Q24H   Continuous Infusions:  lactated ringers 1,000 mL with potassium chloride 20 mEq infusion Stopped (02/16/24 1222)   lactated ringers 1,000 mL with potassium chloride 20 mEq infusion     piperacillin-tazobactam (ZOSYN)  IV 3.375 g (02/16/24 0911)   potassium PHOSPHATE 30 mmol in dextrose 5 % 250 mL infusion 30 mmol (02/16/24 1223)   TPN ADULT (ION) 40 mL/hr at 02/16/24 0426   TPN ADULT (ION)       Impression:   Ileus in the setting of chronic bowel dysmotility Leukocytosis (improving) NG tube with total 300cc yesterday, 450 this morning with 100 cc on my exam (improved from 1050 the day prior).  Symptomatically improving as well and passing 4 large bowel movements over the last 24 hours.  Biliary obstruction s/p ERCP with stenting 10/29/2023.  Possible partial migration but still functioning. Was planned to have ERCP this week but with patient currently admitted for ileus will likely reschedule ERCP.  Normal LFTs.  RLQ pain RLQ pain mainly around the spinal stimulator, less likely from ileus.  CT negative for infection/cellulitis.  Defer to primary   Plan:   - Continue supportive care with NG, TPN, IVF - Continue to keep potassium above 4 and magnesium above 2  - Continue antibiotics - Continue bowel regimen with Dulcolax suppositories - Defer management of biliary obstruction to Dr. Meridee Score as he was planning ERCP 3/6, will likely have to defer until patient stabilizes   Bayley Leanna Sato  02/16/2024, 12:35 PM      Attending Physician's Attestation   I have taken an interval history, reviewed the chart and examined the patient.   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  Patient actually planned for ERCP with biliary stent removal and attempt at [facility] for choledocholithiasis recurrence.  However review of her  chart over the course of the last few weeks suggest that she is not an adequate candidate for this especially with normal LFTs at this time.  Etiology of her symptomatology is not clearly defined.  Neurosurgery did not feel that spinal stimulator had to be removed based on review of the chart.  Hopefully as her bowels improved she will have further improvement in her pain and discomfort.  It is interesting how significant the discomfort is in the area of this stimulator.  Have to defer that to medicine and neurosurgery whether that has to be removed though again does not look like that is the case currently.  Appreciate general surgery monitoring.  If her NG tube output decreases and she continues to have bowel movements, hopefully NG tube can be discontinued in the next few days.  Continue TPN for now.  I will work on rescheduling her ERCP for 6 to 8 weeks from now pending her overall ability.  I agree with the Advanced Practitioner's note, impression, and recommendations with updates and my documentation as noted above.  The majority of the medical decision making/process, formulation of the impression/plan of action for the patient were performed by me with substantive portion of this encounter (>50% time spent including complete performance of  at least one of the key components of MDM, History, and/or Exam).   Corliss Parish, MD [facility] Office # [phone]

## 2024-02-16 NOTE — Progress Notes (Signed)
 Nutrition Follow-up  DOCUMENTATION CODES:   Non-severe (moderate) malnutrition in context of chronic illness  INTERVENTION:   - Monitor magnesium, potassium, and phosphorus BID for at least 3 days, MD to replete as needed, as pt is at risk for refeeding syndrome given significant weight loss and inadequate intake since admission.  - 100mg  Thiamine x5 days.  -TPN management per Pharmacy  NEW NUTRITION DIAGNOSIS:   Moderate Malnutrition related to chronic illness as evidenced by moderate fat depletion, moderate muscle depletion.  GOAL:   Patient will meet greater than or equal to 90% of their needs  Progressing.  MONITOR:   Diet advancement, Labs, Weight trends TPN  REASON FOR ASSESSMENT:   Consult New TPN/TNA  ASSESSMENT:   89 year old F with PMH of esophageal cancer, chronic calcific pancreatitis, pancreatic insufficiency, biliary stones s/p biliary stents in 10/2023, HTN, HLD, GERD, DM-2 and OSA who presented with intermittent RUQ pain and unintentional weight loss since mid January and admitted with working diagnosis of small bowel obstruction.  2/19 Admit; CLD 2/20 NPO; NGT placed 2/21: FLD; NGT removed 2/22: Soft diet 2/23: CLD 2/24: Soft diet 2/27: CLD -> NPO 2/28: NGT placed 3/1: NGT removed and replaced with new NGT; starting TPN   Patient in room, sleeping, no family at bedside.  Pt with NGT, plan per surgery is to clamp and start clears.  Pt is having BMs.  TPN to continue at 40 ml/hr, providing 969 kcals and 48g protein.    Medications: Dulcolax, Thiamine, Lactated ringers, K-Phos  Labs reviewed: CBGs: 133-223 Low K Low Phos   NUTRITION - FOCUSED PHYSICAL EXAM:  Flowsheet Row Most Recent Value  Orbital Region Moderate depletion  Upper Arm Region Moderate depletion  Thoracic and Lumbar Region Unable to assess  Buccal Region Moderate depletion  Temple Region Moderate depletion  Clavicle Bone Region Moderate depletion  Clavicle and Acromion  Bone Region Moderate depletion  Scapular Bone Region Moderate depletion  Dorsal Hand Unable to assess  Patellar Region Moderate depletion  Anterior Thigh Region Moderate depletion  Posterior Calf Region Moderate depletion  Edema (RD Assessment) None  Hair Reviewed  Eyes Unable to assess  Mouth Unable to assess  Skin Reviewed       Diet Order:   Diet Order             Diet clear liquid Fluid consistency: Thin  Diet effective now                   EDUCATION NEEDS:   Not appropriate for education at this time  Skin:  Skin Assessment: Reviewed RN Assessment  Last BM:  3/2 -type 7  Height:   Ht Readings from Last 1 Encounters:  02/04/24 5\' 4"  (1.626 m)    Weight:   Wt Readings from Last 1 Encounters:  02/16/24 63.3 kg    BMI:  Body mass index is 23.95 kg/m.  Estimated Nutritional Needs:   Kcal:  1500-1750 kcals  Protein:  75-90 grams  Fluid:  >/= 1.5L   Tilda Franco, MS, RD, LDN Inpatient Clinical Dietitian Contact via Secure chat

## 2024-02-16 NOTE — Progress Notes (Signed)
  Progress Note   Patient: Sherry Holloway FAO:[id number] DOB: February 13, 1928 DOA: 02/04/2024     12 DOS: the patient was seen and examined on 02/16/2024 at 11:17 AM      Brief hospital course: 89 y.o. F with esophageal CA in remission, hx PE, hx biliary stones s/p stent and ERCP sphincterotomy, chronic pain on MS Contin, DM, OSA who presented with abdominal pain, found to have ileus.  Gen Surg and GI consulted.     Assessment and Plan: * Ileus (HCC) Admitted and NG tube placed.  General surgery and GI consulted.  Small bowel protocol showed passing to the colon on hospital day 2 (2/21), so NG removed and she was started on a diet.  She had a slow and poor recovery.  Had clinical worsening by HD#9 (2/28), so the NG tube was replaced.  CT scan after that on HD#10 (3/1) showed improvement, no transition point.  TPN started 3/1  BM overnight tonight, so Gen Surg have clamped tube. Overall, suspicion is that this is an adynamic ileus from opiates. - Continue TPN for now - ADAT  Leukocytosis - Continue antibiotics  Malnutrition of moderate degree - Continue TPN - Consult nutrition  Chronic pain disorder - Hold home MS Contin  Uncontrolled type 2 diabetes mellitus with hyperglycemia, without long-term current use of insulin (HCC) Glucose slightly elevated - Continue sliding scale corrections -hold Jardiance  Hypertension Blood pressure normal - Hold home valsartan while NG tube in place - As needed hydralazine IV for severe range pressures          Subjective: Patient is uncomfortable, she has had several bowel movements overnight.  No fever, no vomiting.  No confusion.     Physical Exam: BP (!) 113/45 (BP Location: Left Leg)   Pulse 67   Temp 98.2 F (36.8 C) (Oral)   Resp 16   Ht 5\' 4"  (1.626 m)   Wt 63.3 kg   SpO2 92%   BMI 23.95 kg/m   Elderly adult female, lying in bed, appears weak and tired, opens her eyes briefly to answer questions, then closes them  again RRR, no murmurs, no peripheral edema Respiratory rate normal, lungs clear without rales or wheezes Abdomen soft without tenderness palpation or guarding Attention normal, affect normal, judgment and insight appear normal  Data Reviewed: Basic metabolic panel shows potassium 3, phosphorus level Glucose slightly up White blood cell count 20, hemoglobin stable at 9.4  Family Communication: None present    Disposition: Status is: Inpatient Was admitted with ileus.  She has had slow and poor recovery so far.  In the last 24 hours, with holding her MS Contin, she is having bowel movements, there is some hope that we can trial oral intake today and tomorrow and take out the NG tube within the next 24 hours        Author: Alberteen Sam, MD 02/16/2024 3:40 PM  For on call review [email or web address].

## 2024-02-17 ENCOUNTER — Inpatient Hospital Stay (HOSPITAL_COMMUNITY)

## 2024-02-17 DIAGNOSIS — D72829 Elevated white blood cell count, unspecified: Secondary | ICD-10-CM | POA: Insufficient documentation

## 2024-02-17 DIAGNOSIS — K567 Ileus, unspecified: Secondary | ICD-10-CM | POA: Diagnosis not present

## 2024-02-17 DIAGNOSIS — K599 Functional intestinal disorder, unspecified: Secondary | ICD-10-CM | POA: Diagnosis not present

## 2024-02-17 DIAGNOSIS — K831 Obstruction of bile duct: Secondary | ICD-10-CM | POA: Diagnosis not present

## 2024-02-17 LAB — BASIC METABOLIC PANEL
Anion gap: 6 (ref 5–15)
BUN: 18 mg/dL (ref 8–23)
CO2: 26 mmol/L (ref 22–32)
Calcium: 8.2 mg/dL — ABNORMAL LOW (ref 8.9–10.3)
Chloride: 102 mmol/L (ref 98–111)
Creatinine, Ser: 0.38 mg/dL — ABNORMAL LOW (ref 0.44–1.00)
GFR, Estimated: >60 mL/min (ref >60)
Glucose, Bld: 182 mg/dL — ABNORMAL HIGH (ref 70–99)
Potassium: 4.1 mmol/L (ref 3.5–5.1)
Sodium: 134 mmol/L — ABNORMAL LOW (ref 135–145)

## 2024-02-17 LAB — GLUCOSE, CAPILLARY
Glucose-Capillary: 163 mg/dL — ABNORMAL HIGH (ref 70–99)
Glucose-Capillary: 180 mg/dL — ABNORMAL HIGH (ref 70–99)
Glucose-Capillary: 193 mg/dL — ABNORMAL HIGH (ref 70–99)
Glucose-Capillary: 198 mg/dL — ABNORMAL HIGH (ref 70–99)
Glucose-Capillary: 223 mg/dL — ABNORMAL HIGH (ref 70–99)
Glucose-Capillary: 225 mg/dL — ABNORMAL HIGH (ref 70–99)

## 2024-02-17 LAB — CBC
HCT: 33.4 % — ABNORMAL LOW (ref 36.0–46.0)
Hemoglobin: 10.1 g/dL — ABNORMAL LOW (ref 12.0–15.0)
MCH: 26.9 pg (ref 26.0–34.0)
MCHC: 30.2 g/dL (ref 30.0–36.0)
MCV: 88.8 fL (ref 80.0–100.0)
Platelets: 240 10*3/uL (ref 150–400)
RBC: 3.76 MIL/uL — ABNORMAL LOW (ref 3.87–5.11)
RDW: 14.7 % (ref 11.5–15.5)
WBC: 12.1 10*3/uL — ABNORMAL HIGH (ref 4.0–10.5)
nRBC: 0 % (ref 0.0–0.2)

## 2024-02-17 LAB — PHOSPHORUS: Phosphorus: 2.2 mg/dL — ABNORMAL LOW (ref 2.5–4.6)

## 2024-02-17 LAB — MAGNESIUM: Magnesium: 1.9 mg/dL (ref 1.7–2.4)

## 2024-02-17 MED ORDER — TRAVASOL 10 % IV SOLN
INTRAVENOUS | Status: DC
  Filled 2024-02-17: qty 780

## 2024-02-17 MED ORDER — POTASSIUM CHLORIDE 2 MEQ/ML IV SOLN
INTRAVENOUS | Status: DC
  Filled 2024-02-17: qty 1000

## 2024-02-17 MED ORDER — MAGNESIUM SULFATE 2 GM/50ML IV SOLN
2.0000 g | Freq: Once | INTRAVENOUS | Status: AC
  Administered 2024-02-17: 2 g via INTRAVENOUS
  Filled 2024-02-17: qty 50

## 2024-02-17 MED ORDER — POTASSIUM & SODIUM PHOSPHATES 280-160-250 MG PO PACK
1.0000 | PACK | Freq: Three times a day (TID) | ORAL | Status: AC
  Administered 2024-02-17 (×3): 1 via ORAL
  Filled 2024-02-17 (×3): qty 1

## 2024-02-17 MED ORDER — INSULIN ASPART 100 UNIT/ML IJ SOLN: 0.0000 [IU] | INTRAMUSCULAR | Status: DC

## 2024-02-17 MED ORDER — INSULIN ASPART 100 UNIT/ML IJ SOLN
0.0000 [IU] | INTRAMUSCULAR | Status: DC
  Administered 2024-02-17: 4 [IU] via SUBCUTANEOUS
  Administered 2024-02-17 – 2024-02-18 (×3): 7 [IU] via SUBCUTANEOUS
  Administered 2024-02-18: 3 [IU] via SUBCUTANEOUS

## 2024-02-17 NOTE — Progress Notes (Addendum)
 [facility] Progress Note  CC:   Ileus versus PSBO   Subjective: Feeling much better.  Tolerating full liquid diet.  Had a large bowel movement last night.  NG tube is out.  Getting ready to work with physical therapy.  Says that she is no longer really having pain at the site of her spinal stimulator.  Objective:  Vital signs in last 24 hours: Temp:  [98.1 F (36.7 C)-98.9 F (37.2 C)] 98.9 F (37.2 C) (03/04 1228) Pulse Rate:  [62-69] 62 (03/04 1228) Resp:  [15-17] 17 (03/04 0548) BP: (113-140)/(40-66) 123/40 (03/04 1228) SpO2:  [92 %-96 %] 96 % (03/04 1228) Weight:  [16 kg] 63 kg (03/04 0500) Last BM Date : 02/16/24 General:  Alert, Well-developed, in NAD; sitting on the side of the bed. Heart:  Regular rate and rhythm; no murmurs Pulm: Clear to auscultation bilaterally.  No wheezes, rales, or rhonchi. Abdomen:  Soft, nondistended.  Bowel sounds present.  Nontender.  Intake/Output from previous day: 03/03 0701 - 03/04 0700 In: 2283.2 [I.V.:1892.8; IV Piggyback:390.4] Out: 1400 [Urine:1200; Emesis/NG output:200] Intake/Output this shift: Total I/O In: 420 [P.O.:420] Out: -   Lab Results: Recent Labs    02/15/24 0429 02/16/24 0216 02/17/24 0450  WBC 29.3* 20.0* 12.1*  HGB 10.6* 9.4* 10.1*  HCT 33.9* 29.9* 33.4*  PLT 324 270 240   BMET Recent Labs    02/15/24 0429 02/16/24 0216 02/17/24 0450  NA 135 135 134*  K 3.1* 3.0* 4.1  CL 100 102 102  CO2 22 27 26   GLUCOSE 106* 193* 182*  BUN 27* 25* 18  CREATININE 0.49 0.47 0.38*  CALCIUM 9.5 9.0 8.2*   LFT Recent Labs    02/16/24 0216  PROT 4.7*  ALBUMIN 1.7*  AST 12*  ALT 15  ALKPHOS 70  BILITOT 0.5   Assessment / Plan: Ileus in the setting of chronic bowel dysmotility Leukocytosis (improving) NG tube has been removed.  Tolerating clear liquid diet.  Had a large bowel movement last night.   Biliary obstruction s/p ERCP with stenting 10/29/2023.  Possible partial migration but  still functioning. Was planned to have ERCP this week but with patient currently admitted for ileus will likely reschedule ERCP.  Normal LFTs.   RLQ pain RLQ pain mainly around the spinal stimulator, less likely from ileus.  CT negative for infection/cellulitis.  Defer to primary.  Tells me today she is no longer having pain there.  - Continue to keep potassium above 4 and magnesium above 2  - Continue antibiotics per primary service. - Continue bowel regimen with Dulcolax suppositories. - Defer management of biliary obstruction to Dr. Meridee Score as he was planning ERCP 3/6, will likely have to defer until patient stabilizes.  He is planning to reschedule her ERCP for about 6 to 8 weeks out from now.    LOS: 13 days   Princella Pellegrini. Zehr  02/17/2024, 12:29 PM     Attending physician's note   I have taken history, reviewed the chart and examined the patient. I performed a substantive portion of this encounter, including complete performance of at least one of the key components, in conjunction with the APP. I agree with the Advanced Practitioner's note, impression and recommendations.   Doing much better NG tube is out.  Had good BM Minimal abdo pain Tolerating clear liq diet. WBC count 12K  Plan: -Continue current Rx -Keep K> 4 -Advance diet to full liq diet in AM -Rpt ERCP with Dr  GM in 6-8 weeks.   Edman Circle, MD [facility] [phone]

## 2024-02-17 NOTE — Progress Notes (Signed)
 Subjective/Chief Complaint: Reports feeling much better in the last 24-36 hours. Reports a large BM last night. Denies nausea w/ NGT clamped all night, she is about to eat a clear liquid breakfast.   Daughter expresses concern about the RLQ spinal stimulator being a source of leukocytosis. Also would like foley to come out as soon as possible  Objective: Vital signs in last 24 hours: Temp:  [98.1 F (36.7 C)-98.8 F (37.1 C)] 98.8 F (37.1 C) (03/04 0740) Pulse Rate:  [64-69] 69 (03/04 0740) Resp:  [15-17] 17 (03/04 0548) BP: (113-140)/(42-66) 116/42 (03/04 0740) SpO2:  [92 %-94 %] 94 % (03/04 0740) Weight:  [63 kg] 63 kg (03/04 0500) Last BM Date : 02/16/24  Intake/Output from previous day: 03/03 0701 - 03/04 0700 In: 2283.2 [I.V.:1892.8; IV Piggyback:390.4] Out: 1400 [Urine:1200; Emesis/NG output:200] Intake/Output this shift: No intake/output data recorded.  General appearance: alert and cooperative Resp: clear to auscultation bilaterally Cardio: regular rate and rhythm GI: soft, overall non-distended, non-tender, no RLQ pain today, no peritonitis, no guarding  GU: foley drainaing clear yellow urine  Lab Results:  Recent Labs    02/16/24 0216 02/17/24 0450  WBC 20.0* 12.1*  HGB 9.4* 10.1*  HCT 29.9* 33.4*  PLT 270 240   BMET Recent Labs    02/16/24 0216 02/17/24 0450  NA 135 134*  K 3.0* 4.1  CL 102 102  CO2 27 26  GLUCOSE 193* 182*  BUN 25* 18  CREATININE 0.47 0.38*  CALCIUM 9.0 8.2*   PT/INR No results for input(s): "LABPROT", "INR" in the last 72 hours. ABG No results for input(s): "PHART", "HCO3" in the last 72 hours.  Invalid input(s): "PCO2", "PO2"  Studies/Results: DG Chest Port 1 View Result Date: 02/15/2024 CLINICAL DATA:  Short of breath, small-bowel obstruction, emphysema EXAM: PORTABLE CHEST 1 VIEW COMPARISON:  07/27/2023 FINDINGS: Single frontal view of the chest demonstrates an unremarkable cardiac silhouette. Enteric catheter  passes below diaphragm, tip excluded by collimation but side port projecting over the gastric body. Right-sided PICC tip overlies superior vena cava. Stable spinal stimulator leads over the midthoracic central canal. No airspace disease, effusion, or pneumothorax. No acute bony abnormalities. IMPRESSION: 1. Support devices as above. 2. No acute intrathoracic process. Electronically Signed   By: Sharlet Salina M.D.   On: 02/15/2024 14:34    Anti-infectives: Anti-infectives (From admission, onward)    Start     Dose/Rate Route Frequency Ordered Stop   02/14/24 1000  piperacillin-tazobactam (ZOSYN) IVPB 3.375 g        3.375 g 12.5 mL/hr over 240 Minutes Intravenous Every 8 hours 02/14/24 0912         Assessment/Plan: s/p * No surgery found * Partial sbo persistent Recheck abd xray SBO vs ileus in setting of Chronic constipation  -  CT scan of the abdomen and pelvis showed SBO with possible transition point in RLQ. No pneumoperitoneum or signs of bowel ischemia on CT. Visible biliary stents noted in place from her history of choledocholithiasis 10/2023. Gallbladder surgically absent.  - SBO protocol 2/21 - contrast passed into colon, NG removed, diet ordered - NG tube replaced 2/28 for distention, pain, vomiting  - patient also has chronic pain and constipation.  - CT san repeated 3/1 shows improved small bowel distention with adynamic ileus vs pSBO - no transition point - having bowel function and clinically improving. D/C NGT today, advance diet to full liquids  FEN: NPO, D/C NGT. FLD. WJX:BJYN ID: no current abx Foley: placed  for retention - ok to remove from CCS standpoint   - per TRH -  HTN HLD OSA Hx if uterine cancer Hx of esophageal cancer GERD T2DM  LOS: 13 days    Adam Phenix 02/17/2024

## 2024-02-17 NOTE — Assessment & Plan Note (Signed)
 This developed in the last 10 days.  CT abdomen and pelvis showed no source.  CXR clear.  Blood cultures negative.  Her only localizing symptom was pain at her stimulator battery, but clinically this looked fine (no redness or swelling) and on imaging there was no fluid collection or stranding there (not that CT would be extremely sensitive for stimulator infection).    There is no obvious reason her stimulator would get seeded, especially after having been there and defunct for many years.    That said, she is clearly improving in the last 4 days on antibiotics (ileus opening up, WBC resolved, energy and appetite better).  Unfortunately, after 4 days Zosyn, aspirate of the pocket would be ultra low yield.    Surgical removal of the stimulator and wires would require proning and GA. At her age, this should only be done if there is a much higher degree of certainty that we have. Discussed with NSG Dr. Jake Samples today, who was not asked to see patient in person as we have no surgical plan at present. - Complete 7 days empiric Zosyn - Monitor off antibiotics; if pain were to recur at the site of her stimulator, a reasonale approach would be to admit for IR aspiration of the stimulator pocket and Neurosurgery consultation - Optional outpatient NSG office appointment is also reasonable

## 2024-02-17 NOTE — Plan of Care (Signed)

## 2024-02-17 NOTE — Progress Notes (Signed)
 Chaplain received a consult that Sherry Holloway was requesting to speak with a priest.  Sherry Holloway spoke with Sherry Holloway and learned that Sherry Holloway wanted to have communion. Sherry Holloway let her know that she has a Primary school teacher that visits her from her Sherry Holloway. Sherry Holloway. Chaplain reached out to [facility] and Father Sherry Holloway plans to reach out directly to Sherry Holloway's daughter to see how they can be a support.  [address] [place] Pager, [phone]

## 2024-02-17 NOTE — Progress Notes (Signed)
 Physical Therapy Treatment Patient Details Name: Sherry Holloway MRN: [id number] DOB: Jun 15, 1928 Today's Date: 02/17/2024   History of Present Illness Patient is a 89 year old female who presented with unintentional weight loss and intermittent RUQ pain. Patient was found to have small bowel obstruction. PMH; chronic calcific pancreatitis, chronic bilary obstruction. HTN, NIDDM II, hypokalemia, PE. R TKA, esophagus cancer,    PT Comments  Pt alert and awake when PT arrives, states that she has been feeling much better over the last 24 hours. She is agreeable to sitting up in the chair today. Comes to sit EOB with min A for LE management and light trunk assist with HOB elevated. Maintains sitting position EOB, comes to stand with min A and once up requires CGA to reach the chair. Pt requires inc time, but maintains safety throughout. Pt sits in recliner with callbell and tray table, states that she feels good sitting up.     If plan is discharge home, recommend the following: A little help with walking and/or transfers;A little help with bathing/dressing/bathroom;Help with stairs or ramp for entrance;Assist for transportation;Assistance with cooking/housework   Can travel by private vehicle     Yes  Equipment Recommendations  None recommended by PT    Recommendations for Other Services       Precautions / Restrictions Precautions Precautions: Fall Recall of Precautions/Restrictions: Intact Restrictions Weight Bearing Restrictions Per Provider Order: No     Mobility  Bed Mobility Overal bed mobility: Needs Assistance Bed Mobility: Supine to Sit     Supine to sit: Min assist, HOB elevated     General bed mobility comments: requires min A for LE scooting to EOB    Transfers Overall transfer level: Needs assistance Equipment used: Rolling walker (2 wheels) Transfers: Sit to/from Stand Sit to Stand: Min assist           General transfer comment: Pt min A for sit to stand,  requires assist with LE for bed mobility    Ambulation/Gait Ambulation/Gait assistance: Contact guard assist Gait Distance (Feet): 6 Feet Assistive device: Rolling walker (2 wheels) Gait Pattern/deviations: Step-to pattern, Step-through pattern, Decreased step length - right, Decreased step length - left, Shuffle, Trunk flexed Gait velocity: decrease     General Gait Details: Pt CGA with good directional change, weight shift within BOS. RLE circumducts during swing phase   Stairs             Wheelchair Mobility     Tilt Bed    Modified Rankin (Stroke Patients Only)       Balance Overall balance assessment: Needs assistance Sitting-balance support: No upper extremity supported, Feet supported Sitting balance-Leahy Scale: Fair     Standing balance support: Bilateral upper extremity supported, Reliant on assistive device for balance, During functional activity Standing balance-Leahy Scale: Poor                              Communication Communication Communication: No apparent difficulties Factors Affecting Communication: Hearing impaired  Cognition Arousal: Alert Behavior During Therapy: WFL for tasks assessed/performed   PT - Cognitive impairments: No apparent impairments                         Following commands: Intact      Cueing Cueing Techniques: Verbal cues  Exercises      General Comments General comments (skin integrity, edema, etc.): Pt has decreased tolerance to  activity after recent medical course, RLE knee flexion limited      Pertinent Vitals/Pain Pain Assessment Pain Assessment: No/denies pain Pain Intervention(s): Monitored during session    Home Living                          Prior Function            PT Goals (current goals can now be found in the care plan section) Acute Rehab PT Goals Patient Stated Goal: Regain IND PT Goal Formulation: With patient Time For Goal Achievement:  02/21/24 Potential to Achieve Goals: Good Progress towards PT goals: Progressing toward goals    Frequency    Min 1X/week      PT Plan      Co-evaluation              AM-PAC PT "6 Clicks" Mobility   Outcome Measure  Help needed turning from your back to your side while in a flat bed without using bedrails?: A Little Help needed moving from lying on your back to sitting on the side of a flat bed without using bedrails?: A Little Help needed moving to and from a bed to a chair (including a wheelchair)?: A Little Help needed standing up from a chair using your arms (e.g., wheelchair or bedside chair)?: A Lot Help needed to walk in hospital room?: A Little Help needed climbing 3-5 steps with a railing? : Total 6 Click Score: 15    End of Session Equipment Utilized During Treatment: Gait belt Activity Tolerance: Patient tolerated treatment well Patient left: in chair;with call bell/phone within reach;with chair alarm set Nurse Communication: Mobility status PT Visit Diagnosis: Muscle weakness (generalized) (M62.81);History of falling (Z91.81);Difficulty in walking, not elsewhere classified (R26.2)     Time: 1308-6578 PT Time Calculation (min) (ACUTE ONLY): 34 min  Charges:    $Gait Training: 8-22 mins $Therapeutic Activity: 8-22 mins PT General Charges $$ ACUTE PT VISIT: 1 Visit                     Madaline Guthrie, PT Acute Rehabilitation Services Office: [phone] 02/17/2024    Evelena Peat 02/17/2024, 1:58 PM

## 2024-02-17 NOTE — Progress Notes (Signed)
 PHARMACY - TOTAL PARENTERAL NUTRITION CONSULT NOTE   Indication:  ileus in setting of chronic constipation  Patient Measurements: Height: 5\' 4"  (162.6 cm) Weight: 63 kg (138 lb 14.2 oz) IBW/kg (Calculated) : 54.7 TPN AdjBW (KG): 59 Body mass index is 23.84 kg/m. Usual Weight: 22 lb weight loss in last 3 months  Assessment: 89 year old F with PMH of esophageal cancer, chronic calcific pancreatitis, pancreatic insufficiency, biliary stones s/p biliary stents in 10/2023, HTN, HLD, GERD, DM-2 and OSA who presented with intermittent RUQ pain and unintentional weight loss since mid January and admitted with ileus surgery and GI following, to start TPN  Glucose / Insulin: goal < 180 - CBGs 163-245 Electrolytes:  - Phos low at 2.2 - Magnesium on lower end at 1.9  - Na slightly low at 134 - Corrected calcium is now WNL 10.0  - Other electrolytes are WNL     Renal:  - SCr <1 - BUN stable at 18 Hepatic:  - LFTs  WNL - alk Phos WNL - Tbili WNL Intake / Output; MIVF: LR with 20 mEq KCL at 60 ml/hr GI Imaging: GI Surgeries / Procedures:   Central access: 3/2 PICC placed TPN start date: 3/2  Nutritional Goals: Goal TPN rate is 65 mL/hr (provides 78 g of protein and 1575 kcals per day)  RD Assessment: Estimated Needs Total Energy Estimated Needs: 1500-1750 kcals Total Protein Estimated Needs: 75-90 grams Total Fluid Estimated Needs: >/= 1.5L  Current Nutrition:  NPO  Plan:  Now: - Per notes having bowel function and clinically improving. D/C NGT today, advance diet to full liquids. Discussed with CCS PA, continue TPN for today and reevaluate on 3/5   - PhosNaK 1 packet TID x 3 doses  - Magnesium sulfate 2 gr IV x 1     At 1800: Keep  TPN at 58mL/hr at 1800 to ensure no refeeding syndrome as several labs dropped after start of TPN  Electrolytes in TPN:  Na inc to 65 mEq/L K Inc to 75 mEq/L Ca at 2 mEq/L  Mg inc to 8 mEq/L  Phos inc to 25 mmol/L.  Cl:Ac 1:1 Add  standard MVI and trace elements to TPN Increase SSI to  resistant q4h SSI and adjust as needed.  Thiamine 100mg  daily x 5 days per RD rec's through 3/6 Decrease MIVF to 40 mL/hr at 1800 Monitor TPN labs on Mon/Thurs and prn BMP, magnesium, phosphorus with AM labs      Adalberto Cole, PharmD, BCPS 02/17/2024 11:00 AM

## 2024-02-17 NOTE — Progress Notes (Signed)
 Progress Note   Patient: Sherry Holloway ZOX:[id number] DOB: February 11, 1928 DOA: 02/04/2024     13 DOS: the patient was seen and examined on 02/17/2024 at 10:52AM      Brief hospital course: 89 y.o. F with esophageal CA in remission, hx PE, hx biliary stones s/p stent and ERCP sphincterotomy, chronic pain on MS Contin, DM, OSA who presented with abdominal pain, found to have ileus.  Gen Surg and GI consulted.     Assessment and Plan: * Ileus (HCC) See prior summary.  Doing well.  Large BM again, tolerating clears and clamped NG.  Feeling better.  NG out today. - ADAT - PT/OT - Continue TPN for now, hopefully off tomorrow    Leukocytosis This developed in the last 10 days.  CT abdomen and pelvis showed no source.  CXR clear.  Blood cultures negative.  Her only localizing symptom was pain at her stimulator battery, but clinically this looked fine (no redness or swelling) and on imaging there was no fluid collection or stranding there (not that CT would be extremely sensitive for stimulator infection).    There is no obvious reason her stimulator would get seeded, especially after having been there and defunct for many years.    That said, she is clearly improving in the last 4 days on antibiotics (ileus opening up, WBC resolved, energy and appetite better).  Unfortunately, after 4 days Zosyn, aspirate of the pocket would be ultra low yield.    Surgical removal of the stimulator and wires would require proning and GA. At her age, this should only be done if there is a much higher degree of certainty that we have. Discussed with NSG Dr. Jake Samples today, who was not asked to see patient in person as we have no surgical plan at present. - Complete 7 days empiric Zosyn - Monitor off antibiotics; if pain were to recur at the site of her stimulator, a reasonale approach would be to admit for IR aspiration of the stimulator pocket and Neurosurgery consultation - Optional outpatient NSG office  appointment is also reasonable   Chronic pain disorder - Hold home MS Contin  Uncontrolled type 2 diabetes mellitus with hyperglycemia, without long-term current use of insulin (HCC) Glucose near normal - Continue sliding scale corrections -hold Jardiance  Hypertension Blood pressure normal - Hold home valsartan   - As needed hydralazine IV for severe range pressures          Subjective: Patient had another bowel movement.  ICU prior notes contradicted this, but to me she clearly has pain at the site of her stimulator, and reports that this is still somewhat uncomfortable.  There is no redness or swelling around it.  She has had no fever.  She has had no confusion.      Physical Exam: BP (!) 123/40 (BP Location: Right Leg)   Pulse 62   Temp 98.9 F (37.2 C) (Oral)   Resp 17   Ht 5\' 4"  (1.626 m)   Wt 63 kg   SpO2 96%   BMI 23.84 kg/m   Elderly adult female, lying in bed, interactive and appropriate, more alert than yesterday RRR, no murmurs, no peripheral edema Respiratory rate normal, lungs clear without rales or wheezes Abdomen soft, minimal tenderness to palpation, slight guarding throughout, there is no redness or induration, or fluid or fluctuance around her spinal stimulator, but this is tender to palpation and manipulation Attention normal, affect normal, judgment and insight appear normal, face symmetric, speech fluent,  generalized weakness but symmetric strength    Data Reviewed: Discussed with neurosurgery by phone, also general surgery team Basic metabolic panel shows potassium up to normal, persistent hypophosphatemia CBC shows white count down to 12, hemoglobin 10.1, no change from previous  Family Communication: Daughter by phone    Disposition: Status is: Inpatient         Author: Alberteen Sam, MD 02/17/2024 2:28 PM  For on call review [email or web address].

## 2024-02-18 DIAGNOSIS — K567 Ileus, unspecified: Secondary | ICD-10-CM | POA: Diagnosis not present

## 2024-02-18 LAB — GLUCOSE, CAPILLARY
Glucose-Capillary: 148 mg/dL — ABNORMAL HIGH (ref 70–99)
Glucose-Capillary: 173 mg/dL — ABNORMAL HIGH (ref 70–99)
Glucose-Capillary: 202 mg/dL — ABNORMAL HIGH (ref 70–99)
Glucose-Capillary: 212 mg/dL — ABNORMAL HIGH (ref 70–99)
Glucose-Capillary: 222 mg/dL — ABNORMAL HIGH (ref 70–99)
Glucose-Capillary: 251 mg/dL — ABNORMAL HIGH (ref 70–99)

## 2024-02-18 LAB — CBC
HCT: 31 % — ABNORMAL LOW (ref 36.0–46.0)
Hemoglobin: 9.7 g/dL — ABNORMAL LOW (ref 12.0–15.0)
MCH: 26.9 pg (ref 26.0–34.0)
MCHC: 31.3 g/dL (ref 30.0–36.0)
MCV: 85.9 fL (ref 80.0–100.0)
Platelets: 226 10*3/uL (ref 150–400)
RBC: 3.61 MIL/uL — ABNORMAL LOW (ref 3.87–5.11)
RDW: 14.6 % (ref 11.5–15.5)
WBC: 9.7 10*3/uL (ref 4.0–10.5)
nRBC: 0 % (ref 0.0–0.2)

## 2024-02-18 LAB — MAGNESIUM: Magnesium: 2.4 mg/dL (ref 1.7–2.4)

## 2024-02-18 LAB — BASIC METABOLIC PANEL
Anion gap: 5 (ref 5–15)
BUN: 15 mg/dL (ref 8–23)
CO2: 28 mmol/L (ref 22–32)
Calcium: 8.2 mg/dL — ABNORMAL LOW (ref 8.9–10.3)
Chloride: 99 mmol/L (ref 98–111)
Creatinine, Ser: 0.45 mg/dL (ref 0.44–1.00)
GFR, Estimated: >60 mL/min (ref >60)
Glucose, Bld: 191 mg/dL — ABNORMAL HIGH (ref 70–99)
Potassium: 4.5 mmol/L (ref 3.5–5.1)
Sodium: 132 mmol/L — ABNORMAL LOW (ref 135–145)

## 2024-02-18 LAB — PHOSPHORUS: Phosphorus: 3 mg/dL (ref 2.5–4.6)

## 2024-02-18 MED ORDER — ENSURE ENLIVE PO LIQD
237.0000 mL | Freq: Two times a day (BID) | ORAL | Status: DC
Start: 2024-02-18 — End: 2024-02-20
  Administered 2024-02-18 – 2024-02-19 (×3): 237 mL via ORAL

## 2024-02-18 MED ORDER — VALACYCLOVIR HCL 500 MG PO TABS
1000.0000 mg | ORAL_TABLET | Freq: Every day | ORAL | Status: DC
  Administered 2024-02-18 – 2024-02-20 (×3): 1000 mg via ORAL
  Filled 2024-02-18 (×3): qty 2

## 2024-02-18 MED ORDER — MIRTAZAPINE 15 MG PO TABS
15.0000 mg | ORAL_TABLET | Freq: Every day | ORAL | Status: DC
  Administered 2024-02-18 – 2024-02-19 (×2): 15 mg via ORAL
  Filled 2024-02-18 (×2): qty 1

## 2024-02-18 MED ORDER — PANCRELIPASE (LIP-PROT-AMYL) 36000-114000 UNITS PO CPEP
36000.0000 [IU] | ORAL_CAPSULE | Freq: Two times a day (BID) | ORAL | Status: DC
  Administered 2024-02-18 – 2024-02-20 (×4): 36000 [IU] via ORAL
  Filled 2024-02-18 (×4): qty 1

## 2024-02-18 MED ORDER — MELATONIN 5 MG PO TABS
5.0000 mg | ORAL_TABLET | Freq: Every evening | ORAL | Status: DC | PRN
  Administered 2024-02-18: 5 mg via ORAL
  Filled 2024-02-18: qty 1

## 2024-02-18 MED ORDER — RIVAROXABAN 20 MG PO TABS
20.0000 mg | ORAL_TABLET | Freq: Every day | ORAL | Status: DC
  Administered 2024-02-19: 20 mg via ORAL
  Filled 2024-02-18: qty 1

## 2024-02-18 MED ORDER — MORPHINE SULFATE 15 MG PO TABS
7.5000 mg | ORAL_TABLET | ORAL | Status: DC | PRN
  Administered 2024-02-18 – 2024-02-20 (×4): 7.5 mg via ORAL
  Filled 2024-02-18 (×4): qty 1

## 2024-02-18 NOTE — Assessment & Plan Note (Signed)
Resume Xarelto 

## 2024-02-18 NOTE — Progress Notes (Signed)
  Progress Note   Patient: Sherry Holloway ZOX:[id number] DOB: 09-Sep-1928 DOA: 02/04/2024     14 DOS: the patient was seen and examined on 02/18/2024 at 1:51PM and 4:20PM      Brief hospital course: 89 y.o. F with esophageal CA in remission, hx PE, hx biliary stones s/p stent and ERCP sphincterotomy, chronic pain on MS Contin, DM, OSA who presented with abdominal pain, found to have ileus.  Gen Surg and GI consulted.     Assessment and Plan: * Ileus (HCC) See prior summary.  Bowel function has resumed - Continue oral diet - Stop TPN   Leukocytosis See much longer summary from 3/4 - Continue Zosyn, day 5 of 7  Malnutrition of moderate degree - Consult nutrition  Rash and nonspecific skin eruption Has hx HSV, suppressed with famciclovir. - Resume antiviral, formulary equivalent  Chronic pain disorder - Hold home MS Contin - Stop hydromorphone and trial low dose MSIR - Resume mirtazapine  History of venous thromboembolism - Resume Xarelto  Uncontrolled type 2 diabetes mellitus with hyperglycemia, without long-term current use of insulin (HCC) Glucose slightly elevated - Continue sliding scale corrections -hold Jardiance  Hypertension Blood pressure normal - Hold home valsartan while NG tube in place - As needed hydralazine IV for severe range pressures          Subjective: Feels tired.  No other complaints to me.  Bowel movements good.  No vomiting, no abdominal pain, no fever.     Physical Exam: BP (!) 154/44 (BP Location: Left Leg)   Pulse 69   Temp 98.7 F (37.1 C)   Resp 18   Ht 5\' 4"  (1.626 m)   Wt 64.2 kg   SpO2 96%   BMI 24.29 kg/m   Thin elderly female, lying in bed, no acute distress RRR, no murmurs, no peripheral edema Respiratory rate normal, lungs clear without rales or wheeze Abdomen soft, no tenderness to palpation, no guarding Attention normal, affect blunted, psychomotor slowing noted, judgment and insight appear normal, oriented  to person, place, time, generalized weakness but symmetric strength    Data Reviewed: Discussed with gastroenterology team Magnesium and phosphorus normal Sodium 132 Creatinine and potassium normal Hemoglobin stable at 9.7, white count resulted normal  Family Communication: Daughter at the bedside    Disposition: Status is: Inpatient         Author: Alberteen Sam, MD 02/18/2024 4:41 PM  For on call review [email or web address].

## 2024-02-18 NOTE — Assessment & Plan Note (Signed)
 Has hx HSV, suppressed with famciclovir. - Resume antiviral, formulary equivalent

## 2024-02-18 NOTE — Progress Notes (Addendum)
 Subjective/Chief Complaint: Doing great. No complaints expressed. Her daughter is present at bedside assisting in patient care.  Daughter notes concern about the RLQ spinal stimulator being a source of leukocytosis - reports consultation has been had with neurosurgery  Objective: Vital signs in last 24 hours: Temp:  [98 F (36.7 C)-99.1 F (37.3 C)] 98.5 F (36.9 C) (03/05 0415) Pulse Rate:  [62-72] 69 (03/05 0415) Resp:  [18] 18 (03/05 0415) BP: (119-157)/(35-44) 154/44 (03/05 0415) SpO2:  [94 %-97 %] 94 % (03/05 0415) Weight:  [64.2 kg] 64.2 kg (03/05 0415) Last BM Date : 02/17/24  Intake/Output from previous day: 03/04 0701 - 03/05 0700 In: 1876.7 [P.O.:420; I.V.:1274.6; IV Piggyback:182] Out: 950 [Urine:950] Intake/Output this shift: No intake/output data recorded.  General appearance: alert undergoing patient care Resp: normal work of breathing GI: soft, overall non-distended, non-tender  Lab Results:  Recent Labs    02/17/24 0450 02/18/24 0259  WBC 12.1* 9.7  HGB 10.1* 9.7*  HCT 33.4* 31.0*  PLT 240 226   BMET Recent Labs    02/17/24 0450 02/18/24 0259  NA 134* 132*  K 4.1 4.5  CL 102 99  CO2 26 28  GLUCOSE 182* 191*  BUN 18 15  CREATININE 0.38* 0.45  CALCIUM 8.2* 8.2*   PT/INR No results for input(s): "LABPROT", "INR" in the last 72 hours. ABG No results for input(s): "PHART", "HCO3" in the last 72 hours.  Invalid input(s): "PCO2", "PO2"  Studies/Results: DG Abd 2 Views Result Date: 02/17/2024 CLINICAL DATA:  Bowel obstruction. EXAM: ABDOMEN - 2 VIEW COMPARISON:  February 14, 2024. FINDINGS: Nasogastric tube tip seen in expected position of distal stomach. Common bile duct stents are noted. Status post cholecystectomy. No abnormal bowel dilatation. There appears to be contrast in the sigmoid colon. IMPRESSION: No abnormal bowel dilatation. Electronically Signed   By: Lupita Raider M.D.   On: 02/17/2024 13:36     Anti-infectives: Anti-infectives (From admission, onward)    Start     Dose/Rate Route Frequency Ordered Stop   02/18/24 1000  valACYclovir (VALTREX) tablet 1,000 mg        1,000 mg Oral Daily 02/18/24 0825     02/14/24 1000  piperacillin-tazobactam (ZOSYN) IVPB 3.375 g        3.375 g 12.5 mL/hr over 240 Minutes Intravenous Every 8 hours 02/14/24 0912         Assessment/Plan: s/p * No surgery found * Partial sbo persistent Recheck abd xray SBO vs ileus in setting of Chronic constipation  -  CT scan of the abdomen and pelvis showed SBO with possible transition point in RLQ. No pneumoperitoneum or signs of bowel ischemia on CT. Visible biliary stents noted in place from her history of choledocholithiasis 10/2023. Gallbladder surgically absent.  - SBO protocol 2/21 - contrast passed into colon, NG removed, diet ordered - NG tube replaced 2/28 for distention, pain, vomiting  - patient also has chronic pain and constipation.  - CT san repeated 3/1 shows improved small bowel distention with adynamic ileus vs pSBO - no transition point - having bowel function and clinically improving.Tolerating diet  We will remain available if questions or concerns arise please let us know.  VFI:EPPI ID: no current abx Foley: per primary  - per TRH -  HTN HLD OSA Hx if uterine cancer Hx of esophageal cancer GERD T2DM  LOS: 14 days    I spent a total of 15 minutes in both face-to-face and non-face-to-face activities, excluding procedures performed,  for this visit on the date of this encounter.  Stephanie Coup Arvis Zwahlen 02/18/2024

## 2024-02-18 NOTE — Plan of Care (Signed)

## 2024-02-18 NOTE — Progress Notes (Signed)
 OT Cancellation Note  Patient Details Name: Sherry Holloway MRN: [id number] DOB: [birth year]   Cancelled Treatment:    Reason Eval/Treat Not Completed: Fatigue/lethargy limiting ability to participate (Will continue to follow.)  Evern Bio 02/18/2024, 3:17 PM Berna Spare, OTR/L Acute Rehabilitation Services Office: [phone]

## 2024-02-19 ENCOUNTER — Encounter (HOSPITAL_COMMUNITY): Admission: RE | Payer: Self-pay | Source: Home / Self Care

## 2024-02-19 ENCOUNTER — Ambulatory Visit (HOSPITAL_COMMUNITY): Admission: RE | Admit: 2024-02-19 | Payer: Medicare Other | Source: Home / Self Care | Admitting: Gastroenterology

## 2024-02-19 DIAGNOSIS — R1031 Right lower quadrant pain: Secondary | ICD-10-CM | POA: Diagnosis not present

## 2024-02-19 DIAGNOSIS — K5669 Other partial intestinal obstruction: Secondary | ICD-10-CM | POA: Diagnosis not present

## 2024-02-19 DIAGNOSIS — G8929 Other chronic pain: Secondary | ICD-10-CM | POA: Diagnosis not present

## 2024-02-19 LAB — BASIC METABOLIC PANEL
Anion gap: 7 (ref 5–15)
BUN: 12 mg/dL (ref 8–23)
CO2: 28 mmol/L (ref 22–32)
Calcium: 8.6 mg/dL — ABNORMAL LOW (ref 8.9–10.3)
Chloride: 102 mmol/L (ref 98–111)
Creatinine, Ser: 0.51 mg/dL (ref 0.44–1.00)
GFR, Estimated: >60 mL/min (ref >60)
Glucose, Bld: 192 mg/dL — ABNORMAL HIGH (ref 70–99)
Potassium: 4 mmol/L (ref 3.5–5.1)
Sodium: 137 mmol/L (ref 135–145)

## 2024-02-19 LAB — CULTURE, BLOOD (ROUTINE X 2): Culture: NO GROWTH

## 2024-02-19 LAB — CBC
HCT: 32.3 % — ABNORMAL LOW (ref 36.0–46.0)
Hemoglobin: 10 g/dL — ABNORMAL LOW (ref 12.0–15.0)
MCH: 27 pg (ref 26.0–34.0)
MCHC: 31 g/dL (ref 30.0–36.0)
MCV: 87.3 fL (ref 80.0–100.0)
Platelets: 214 10*3/uL (ref 150–400)
RBC: 3.7 MIL/uL — ABNORMAL LOW (ref 3.87–5.11)
RDW: 14.6 % (ref 11.5–15.5)
WBC: 8.8 10*3/uL (ref 4.0–10.5)
nRBC: 0 % (ref 0.0–0.2)

## 2024-02-19 LAB — GLUCOSE, CAPILLARY
Glucose-Capillary: 158 mg/dL — ABNORMAL HIGH (ref 70–99)
Glucose-Capillary: 185 mg/dL — ABNORMAL HIGH (ref 70–99)

## 2024-02-19 SURGERY — ENDOSCOPIC RETROGRADE CHOLANGIOPANCREATOGRAPHY (ERCP) WITH PROPOFOL
Anesthesia: General

## 2024-02-19 MED ORDER — ADULT MULTIVITAMIN W/MINERALS CH
1.0000 | ORAL_TABLET | Freq: Every day | ORAL | Status: DC
  Administered 2024-02-19 – 2024-02-20 (×2): 1 via ORAL
  Filled 2024-02-19 (×2): qty 1

## 2024-02-19 NOTE — [place] FL2 (Signed)
 [place] MEDICAID FL2 LEVEL OF CARE FORM     IDENTIFICATION  Patient Name: Sherry Holloway Birthdate: [birth year] Sex: female Admission Date (Current Location): 02/04/2024  [facility] and [place] Number:  Producer, television/film/video and Address:  [facility],  [address] [place], [place] [address]      Provider Number: [id number]  Attending Physician Name and Address:  Alberteen Sam, *  Relative Name and Phone Number:  Alfonso Ramus (dtr) [phone]    Current Level of Care: SNF Recommended Level of Care: Skilled Nursing Facility Prior Approval Number:    Date Approved/Denied:   PASRR Number: [id number]  Discharge Plan: SNF    Current Diagnoses: Patient Active Problem List   Diagnosis Date Noted   Leukocytosis 02/17/2024   Malnutrition of moderate degree 02/16/2024   Gastrointestinal dysmotility 02/15/2024   Constipation, chronic 02/15/2024   Abdominal pain, chronic, right lower quadrant 02/12/2024   Nausea and vomiting 02/04/2024   Ileus (HCC) 02/04/2024   Multiple gastric ulcers 12/24/2023   History of biliary stent insertion 12/24/2023   Chronic anticoagulation 12/24/2023   Biliary obstruction 10/30/2023   LFT elevation 10/29/2023   IPMN (intraductal papillary mucinous neoplasm) 10/29/2023   Abdominal pain, chronic, right upper quadrant 10/29/2023   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) 10/29/2023   Abdominal pain 10/27/2023   History of pulmonary embolism 10/27/2023   Elevated liver enzymes 10/27/2023   Symptomatic sinus bradycardia 07/27/2023   Bradycardia 05/02/2023   Dizziness 05/02/2023   At high risk for injury related to fall 02/06/2023   Balance disorder 01/30/2023   Vulvar rash 04/02/2022   Hyperlipidemia associated with type 2 diabetes mellitus (HCC) 04/02/2022   Rash and nonspecific skin eruption 02/19/2022   Chronic pain disorder 11/26/2021   Choledocholithiasis 11/25/2021   Personal history of esophageal cancer  08/26/2021   Weight loss, unintentional 08/26/2021   Type 2 diabetes mellitus with diabetic polyneuropathy, without long-term current use of insulin (HCC) 06/12/2021   Snoring 04/30/2021   At risk for central sleep apnea 04/30/2021   Acute pulmonary embolism (HCC) 03/29/2021   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) 03/19/2021   History of venous thromboembolism    Subclavian arterial stenosis (HCC) 02/13/2021   Acquired trigger finger of left middle finger 01/09/2021   Acquired trigger finger of left ring finger 01/09/2021   Trigger finger of left hand 12/24/2020   Plantar wart 12/24/2020   Pancreatic mass 10/19/2020   Gastroesophageal reflux disease with esophagitis without hemorrhage 10/19/2020   Hypertension 10/19/2020   Uncontrolled type 2 diabetes mellitus with hyperglycemia, without long-term current use of insulin (HCC) 10/19/2020   Vaginal odor 10/19/2020   Hyperlipidemia 10/19/2020   OSA (obstructive sleep apnea) 10/19/2020   Chronic bilateral back pain 10/19/2020    Orientation RESPIRATION BLADDER Height & Weight     Self, Time, Situation  Normal Continent Weight: 64.2 kg Height:  5\' 4"  (162.6 cm)  BEHAVIORAL SYMPTOMS/MOOD NEUROLOGICAL BOWEL NUTRITION STATUS      Continent Diet (soft)  AMBULATORY STATUS COMMUNICATION OF NEEDS Skin   Limited Assist Verbally Bruising, Other (Comment) (erythema and ecchymosis to right heel, bilateral arms, and buttocks)                       Personal Care Assistance Level of Assistance  Bathing, Feeding, Dressing Bathing Assistance: Limited assistance Feeding assistance: Independent Dressing Assistance: Limited assistance     Functional Limitations Info  Sight, Hearing, Speech Sight Info: Impaired (  glasses) Hearing Info: Impaired (hearing aids) Speech Info: Adequate    SPECIAL CARE FACTORS FREQUENCY  PT (By licensed PT), OT (By licensed OT)     PT Frequency: 5x/week OT Frequency: 5x/week            Contractures  Contractures Info: Not present    Additional Factors Info  Code Status, Allergies Code Status Info: DNR Allergies Info: Gramicidin, Sulfa Antibiotics, Wound Dressing Adhesive           Current Medications (02/19/2024):  This is the current hospital active medication list Current Facility-Administered Medications  Medication Dose Route Frequency Provider Last Rate Last Admin   acetaminophen (TYLENOL) suppository 650 mg  650 mg Rectal Q6H PRN Karie Soda, MD       acetaminophen (TYLENOL) tablet 650 mg  650 mg Oral Q6H PRN Marcelino Duster, MD       bisacodyl (DULCOLAX) suppository 10 mg  10 mg Rectal Daily Karie Soda, MD   10 mg at 02/17/24 1830   Chlorhexidine Gluconate Cloth 2 % PADS 6 each  6 each Topical Daily Almon Hercules, MD   6 each at 02/19/24 0946   feeding supplement (ENSURE ENLIVE / ENSURE PLUS) liquid 237 mL  237 mL Oral BID BM Alberteen Sam, MD   237 mL at 02/19/24 0956   hydrALAZINE (APRESOLINE) injection 10 mg  10 mg Intravenous Q4H PRN Tobey Grim, MD   10 mg at 02/13/24 1721   latanoprost (XALATAN) 0.005 % ophthalmic solution 1 drop  1 drop Both Eyes QHS Rometta Emery, MD   1 drop at 02/18/24 2117   lipase/protease/amylase (CREON) capsule 36,000 Units  36,000 Units Oral BID AC Alberteen Sam, MD   36,000 Units at 02/19/24 0945   magic mouthwash  15 mL Oral QID PRN Karie Soda, MD       melatonin tablet 5 mg  5 mg Oral QHS PRN Anthoney Harada, NP   5 mg at 02/18/24 2117   menthol-cetylpyridinium (CEPACOL) lozenge 3 mg  1 lozenge Oral PRN Karie Soda, MD       mirtazapine (REMERON) tablet 15 mg  15 mg Oral QHS Alberteen Sam, MD   15 mg at 02/18/24 2117   morphine (MSIR) tablet 7.5 mg  7.5 mg Oral Q4H PRN Alberteen Sam, MD   7.5 mg at 02/19/24 1101   naphazoline-glycerin (CLEAR EYES REDNESS) ophth solution 1-2 drop  1-2 drop Both Eyes QID PRN Karie Soda, MD       ondansetron [facility]) injection 4 mg  4 mg Intravenous  Q6H PRN Karie Soda, MD       Oral care mouth rinse  15 mL Mouth Rinse PRN Tobey Grim, MD   15 mL at 02/07/24 2100   pantoprazole (PROTONIX) injection 40 mg  40 mg Intravenous Laurena Slimmer, MD   40 mg at 02/18/24 2117   phenol (CHLORASEPTIC) mouth spray 2 spray  2 spray Mouth/Throat PRN Karie Soda, MD       piperacillin-tazobactam (ZOSYN) IVPB 3.375 g  3.375 g Intravenous Q8H Danford, Earl Lites, MD 12.5 mL/hr at 02/19/24 0953 3.375 g at 02/19/24 0953   prochlorperazine (COMPAZINE) injection 5-10 mg  5-10 mg Intravenous Q4H PRN Karie Soda, MD   10 mg at 02/14/24 1610   rivaroxaban (XARELTO) tablet 20 mg  20 mg Oral Q supper Danford, Earl Lites, MD       simethicone (MYLICON) 40 MG/0.6ML suspension 80 mg  80 mg Oral QID  PRN Karie Soda, MD       sodium chloride (OCEAN) 0.65 % nasal spray 1-2 spray  1-2 spray Each Nare Q6H PRN Karie Soda, MD       sodium chloride flush (NS) 0.9 % injection 10-40 mL  10-40 mL Intracatheter PRN Gonfa, Taye T, MD       sucralfate (CARAFATE) tablet 1 g  1 g Oral BID Earlie Lou L, MD   1 g at 02/19/24 0945   valACYclovir (VALTREX) tablet 1,000 mg  1,000 mg Oral Daily Alberteen Sam, MD   1,000 mg at 02/19/24 0945     Discharge Medications: Please see discharge summary for a list of discharge medications.  Relevant Imaging Results:  Relevant Lab Results:   Additional Information SSN [id number]  Adrian Prows, RN

## 2024-02-19 NOTE — Progress Notes (Signed)
 Nutrition Follow-up  DOCUMENTATION CODES:   Non-severe (moderate) malnutrition in context of chronic illness  INTERVENTION:  - Soft diet per MD.  - Ensure Plus High Protein po BID, each supplement provides 350 kcal and 20 grams of protein. - Encourage intake at all meals and of supplements as tolerated. - Add Multivitamin with minerals daily - Monitor weight trends.   NUTRITION DIAGNOSIS:   Moderate Malnutrition related to chronic illness as evidenced by moderate fat depletion, moderate muscle depletion. *ongoing  GOAL:   Patient will meet greater than or equal to 90% of their needs *progressing  MONITOR:   Diet advancement, Labs, Weight trends  REASON FOR ASSESSMENT:   Consult New TPN/TNA  ASSESSMENT:   89 year old F with PMH of esophageal cancer, chronic calcific pancreatitis, pancreatic insufficiency, biliary stones s/p biliary stents in 10/2023, HTN, HLD, GERD, DM-2 and OSA who presented with intermittent RUQ pain and unintentional weight loss since mid January and admitted with working diagnosis of small bowel obstruction.  2/19 Admit; CLD 2/20 NPO; NGT placed 2/21: FLD; NGT removed 2/22: Soft diet 2/23: CLD 2/24: Soft diet 2/27: CLD -> NPO 2/28: NGT placed 3/1: NGT removed and replaced with new NGT 3/2: TPN Initiated 3/3: CLD 3/4: FLD 3/5 Soft diet; TPN discontinued by MD   Patient reports she is tolerating a diet fairly well. Eating around 2 meals a day which is typical for her. Notes the food is very good and she is enjoying it.  Also likes Ensure and drinking it.  Encouraged patient to try and order/consume 3 meals a day and continue to drink Ensure to aid in meeting nutrition needs.  Admit weight: 130# Current weight: 141# I&O's: +2.5L since 2/20  Medications reviewed and include: Dulcolax, 36,000 units Creon BID, Remeron, Carafate  Labs reviewed:  HA1C 7.7 Blood Glucose 118-251 x24 hours   Diet Order:   Diet Order             DIET SOFT  Room service appropriate? Yes; Fluid consistency: Thin  Diet effective now                   EDUCATION NEEDS:  Not appropriate for education at this time  Skin:  Skin Assessment: Reviewed RN Assessment  Last BM:  3/6 - type 7  Height:  Ht Readings from Last 1 Encounters:  02/04/24 5\' 4"  (1.626 m)   Weight:  Wt Readings from Last 1 Encounters:  02/18/24 64.2 kg    BMI:  Body mass index is 24.29 kg/m.  Estimated Nutritional Needs:  Kcal:  1500-1750 kcals Protein:  75-90 grams Fluid:  >/= 1.5L    Shelle Iron RD, LDN Contact via Secure Chat.

## 2024-02-19 NOTE — Plan of Care (Signed)
 Problem: Education: Goal: Knowledge of General Education information will improve Description: Including pain rating scale, medication(s)/side effects and non-pharmacologic comfort measures 02/19/2024 2219 by Elder Cyphers, RN Outcome: Progressing 02/19/2024 2219 by Elder Cyphers, RN Outcome: Progressing   Problem: Health Behavior/Discharge Planning: Goal: Ability to manage health-related needs will improve 02/19/2024 2219 by Elder Cyphers, RN Outcome: Progressing 02/19/2024 2219 by Elder Cyphers, RN Outcome: Progressing   Problem: Clinical Measurements: Goal: Ability to maintain clinical measurements within normal limits will improve 02/19/2024 2219 by Elder Cyphers, RN Outcome: Progressing 02/19/2024 2219 by Elder Cyphers, RN Outcome: Progressing Goal: Will remain free from infection 02/19/2024 2219 by Elder Cyphers, RN Outcome: Progressing 02/19/2024 2219 by Elder Cyphers, RN Outcome: Progressing Goal: Diagnostic test results will improve 02/19/2024 2219 by Elder Cyphers, RN Outcome: Progressing 02/19/2024 2219 by Elder Cyphers, RN Outcome: Progressing Goal: Respiratory complications will improve 02/19/2024 2219 by Elder Cyphers, RN Outcome: Progressing 02/19/2024 2219 by Elder Cyphers, RN Outcome: Progressing Goal: Cardiovascular complication will be avoided 02/19/2024 2219 by Elder Cyphers, RN Outcome: Progressing 02/19/2024 2219 by Elder Cyphers, RN Outcome: Progressing   Problem: Activity: Goal: Risk for activity intolerance will decrease 02/19/2024 2219 by Elder Cyphers, RN Outcome: Progressing 02/19/2024 2219 by Elder Cyphers, RN Outcome: Progressing   Problem: Nutrition: Goal: Adequate nutrition will be maintained 02/19/2024 2219 by Elder Cyphers, RN Outcome: Progressing 02/19/2024 2219 by Elder Cyphers, RN Outcome: Progressing   Problem: Coping: Goal: Level of anxiety will decrease 02/19/2024 2219 by Elder Cyphers, RN Outcome:  Progressing 02/19/2024 2219 by Elder Cyphers, RN Outcome: Progressing   Problem: Elimination: Goal: Will not experience complications related to bowel motility 02/19/2024 2219 by Elder Cyphers, RN Outcome: Progressing 02/19/2024 2219 by Elder Cyphers, RN Outcome: Progressing Goal: Will not experience complications related to urinary retention 02/19/2024 2219 by Elder Cyphers, RN Outcome: Progressing 02/19/2024 2219 by Elder Cyphers, RN Outcome: Progressing   Problem: Pain Managment: Goal: General experience of comfort will improve and/or be controlled 02/19/2024 2219 by Elder Cyphers, RN Outcome: Progressing 02/19/2024 2219 by Elder Cyphers, RN Outcome: Progressing   Problem: Safety: Goal: Ability to remain free from injury will improve 02/19/2024 2219 by Elder Cyphers, RN Outcome: Progressing 02/19/2024 2219 by Elder Cyphers, RN Outcome: Progressing   Problem: Skin Integrity: Goal: Risk for impaired skin integrity will decrease 02/19/2024 2219 by Elder Cyphers, RN Outcome: Progressing 02/19/2024 2219 by Elder Cyphers, RN Outcome: Progressing   Problem: Education: Goal: Ability to describe self-care measures that may prevent or decrease complications (Diabetes Survival Skills Education) will improve 02/19/2024 2219 by Elder Cyphers, RN Outcome: Progressing 02/19/2024 2219 by Elder Cyphers, RN Outcome: Progressing Goal: Individualized Educational Video(s) 02/19/2024 2219 by Elder Cyphers, RN Outcome: Progressing 02/19/2024 2219 by Elder Cyphers, RN Outcome: Progressing   Problem: Coping: Goal: Ability to adjust to condition or change in health will improve 02/19/2024 2219 by Elder Cyphers, RN Outcome: Progressing 02/19/2024 2219 by Elder Cyphers, RN Outcome: Progressing   Problem: Fluid Volume: Goal: Ability to maintain a balanced intake and output will improve 02/19/2024 2219 by Elder Cyphers, RN Outcome: Progressing 02/19/2024 2219 by  Elder Cyphers, RN Outcome: Progressing   Problem: Health Behavior/Discharge Planning: Goal: Ability to identify and utilize available resources and services will improve 02/19/2024 2219 by Elder Cyphers, RN Outcome: Progressing 02/19/2024 2219 by Elder Cyphers, RN Outcome: Progressing Goal: Ability to manage health-related needs will improve 02/19/2024 2219 by Elder Cyphers, RN Outcome: Progressing 02/19/2024 2219 by Elder Cyphers, RN Outcome: Progressing   Problem: Metabolic: Goal: Ability to maintain appropriate glucose  levels will improve 02/19/2024 2219 by Elder Cyphers, RN Outcome: Progressing 02/19/2024 2219 by Elder Cyphers, RN Outcome: Progressing   Problem: Nutritional: Goal: Maintenance of adequate nutrition will improve 02/19/2024 2219 by Elder Cyphers, RN Outcome: Progressing 02/19/2024 2219 by Elder Cyphers, RN Outcome: Progressing Goal: Progress toward achieving an optimal weight will improve 02/19/2024 2219 by Elder Cyphers, RN Outcome: Progressing 02/19/2024 2219 by Elder Cyphers, RN Outcome: Progressing   Problem: Skin Integrity: Goal: Risk for impaired skin integrity will decrease 02/19/2024 2219 by Elder Cyphers, RN Outcome: Progressing 02/19/2024 2219 by Elder Cyphers, RN Outcome: Progressing   Problem: Tissue Perfusion: Goal: Adequacy of tissue perfusion will improve 02/19/2024 2219 by Elder Cyphers, RN Outcome: Progressing 02/19/2024 2219 by Elder Cyphers, RN Outcome: Progressing

## 2024-02-19 NOTE — Progress Notes (Signed)
  Progress Note   Patient: Sherry Holloway ZOX:[id number] DOB: [birth year] DOA: 02/04/2024     15 DOS: the patient was seen and examined on 02/19/2024 at 8:55AM      Brief hospital course: 89 y.o. F with esophageal CA in remission, hx PE, hx biliary stones s/p stent and ERCP sphincterotomy, hx chronic pancreatitis, hx chronic pain on MS Contin, DM, OSA who presented with abdominal pain, found to have ileus.  Gen Surg and GI consulted.     Assessment and Plan: * Ileus (HCC) See summary 3/4.  Now resolved - Continue oral diet   Leukocytosis See summary from 3/4 - Continue Zosyn, day 6 of 7   Rash and nonspecific skin eruption Has hx HSV, suppressed with famciclovir. - Continue Valtrex  Chronic pain disorder -Hold home MS Contin given suspicion for association with ileus - Continue low-dose MS IR, which appears to be working - Continue mirtazapine   History of venous thromboembolism -Continue Xarelto  Uncontrolled type 2 diabetes mellitus with hyperglycemia, without long-term current use of insulin (HCC) Glucose controlled - Continue sliding scale corrections - Hold Jardiance  Hypertension Blood pressure near normal -Resume valsartan at discharge  Pancreatic insufficiency - Continue Creon         Subjective: No fever, no respiratory symptoms, no vomiting.  Appetite is okay, she is very sleepy, and she seems to have some forgetfulness about the conversation we have had earlier this week.     Physical Exam: BP (!) 179/57 (BP Location: Right Arm)   Pulse 64   Temp 98.1 F (36.7 C)   Resp 16   Ht 5\' 4"  (1.626 m)   Wt 64.2 kg   SpO2 96%   BMI 24.29 kg/m   Thin adult female, lying in bed, no acute distress RRR, no murmurs, no peripheral edema Respiratory rate normal, lungs clear without rales or wheezes Abdomen soft, no tenderness palpation, no guarding Attention normal, psychomotor slowing mild, seems forgetful, generalized weakness, symmetric  strength      Data Reviewed: Basic metabolic panel unremarkable CBC shows mild anemia only  Family Communication:     Disposition: Status is: Inpatient         Author: Alberteen Sam, MD 02/19/2024 3:53 PM  For on call review [email or web address].

## 2024-02-19 NOTE — TOC Progression Note (Addendum)
 Transition of Care [facility]) - Progression Note    Patient Details  Name: Sherry Holloway MRN: [id number] Date of Birth: [birth year]  [facility] of [facility]) CM/SW Contact  Adrian Prows, RN Phone Number: 02/19/2024, 11:01 AM  Clinical Narrative:    [facility] consult for SNF; spoke w/ pt in room; she agrees to recc but requests this RN/CM speak w/ her dtr; called pt's dtr Alfonso Ramus [phone]); she also agrees to recc; she says family prefers [facility]; Ms Mason Jim would like bed search limited to [facility]; [place] # [phone] [id number]; FL2 completed; faxed out for beds; Nikki at [facility]; awaiting offers.   Expected Discharge Plan: Home/Self Care Barriers to Discharge: Continued Medical Work up  Expected Discharge Plan and Services   Discharge Planning Services: CM Consult   Living arrangements for the past 2 months: Apartment                                       Social Determinants of Health (SDOH) Interventions SDOH Screenings   Food Insecurity: No Food Insecurity (02/05/2024)  Housing: Low Risk  (02/05/2024)  Transportation Needs: No Transportation Needs (02/05/2024)  Utilities: Not At Risk (02/05/2024)  Alcohol Screen: Low Risk  (04/02/2022)  Depression (PHQ2-9): Low Risk  (01/30/2023)  Financial Resource Strain: Low Risk  (04/02/2022)  Physical Activity: Sufficiently Active (04/02/2022)  Social Connections: Moderately Isolated (02/04/2024)  Stress: No Stress Concern Present (04/02/2022)  Tobacco Use: Medium Risk (02/04/2024)    Readmission Risk Interventions    02/05/2024   12:19 PM 10/31/2023    2:26 PM  Readmission Risk Prevention Plan  Transportation Screening Complete Complete  PCP or Specialist Appt within 3-5 Days Complete Complete  HRI or Home Care Consult Complete Complete  Social Work Consult for Recovery Care Planning/Counseling Complete Complete  Palliative Care Screening Not Applicable Not Applicable  Medication Review Furniture conservator/restorer) Complete Complete

## 2024-02-19 NOTE — Plan of Care (Signed)

## 2024-02-19 NOTE — Plan of Care (Signed)
  Problem: Education: Goal: Knowledge of General Education information will improve Description: Including pain rating scale, medication(s)/side effects and non-pharmacologic comfort measures Outcome: Progressing   Problem: Health Behavior/Discharge Planning: Goal: Ability to manage health-related needs will improve Outcome: Progressing   Problem: Clinical Measurements: Goal: Will remain free from infection Outcome: Progressing Goal: Diagnostic test results will improve Outcome: Progressing Goal: Respiratory complications will improve Outcome: Progressing Goal: Cardiovascular complication will be avoided Outcome: Progressing   Problem: Activity: Goal: Risk for activity intolerance will decrease Outcome: Not Progressing   Problem: Nutrition: Goal: Adequate nutrition will be maintained Outcome: Not Progressing   Problem: Coping: Goal: Level of anxiety will decrease Outcome: Progressing   Problem: Elimination: Goal: Will not experience complications related to bowel motility Outcome: Progressing Goal: Will not experience complications related to urinary retention Outcome: Progressing   Problem: Safety: Goal: Ability to remain free from injury will improve Outcome: Progressing   Problem: Skin Integrity: Goal: Risk for impaired skin integrity will decrease Outcome: Progressing   Problem: Metabolic: Goal: Ability to maintain appropriate glucose levels will improve Outcome: Progressing

## 2024-02-20 DIAGNOSIS — K567 Ileus, unspecified: Secondary | ICD-10-CM | POA: Diagnosis not present

## 2024-02-20 MED ORDER — MORPHINE SULFATE ER 15 MG PO TBCR: 15.0000 mg | EXTENDED_RELEASE_TABLET | Freq: Two times a day (BID) | ORAL | 0 refills | Status: DC

## 2024-02-20 MED ORDER — MORPHINE SULFATE 15 MG PO TABS
7.5000 mg | ORAL_TABLET | Freq: Four times a day (QID) | ORAL | 0 refills | Status: DC | PRN
Start: 2024-02-20 — End: 2024-02-20

## 2024-02-20 MED ORDER — ENSURE ENLIVE PO LIQD: 237.0000 mL | Freq: Two times a day (BID) | ORAL | Status: DC

## 2024-02-20 NOTE — Progress Notes (Signed)
 Spoke with primary Rn regarding PICC line removal. RN reported the pt may not be discharging today, and will reconsult when PICC needs removing.removing.

## 2024-02-20 NOTE — TOC Transition Note (Addendum)
 Transition of Care [facility] - Discharge Note   Patient Details  Name: Sherry Holloway MRN: [id number] Date of Birth: 04-18-1928  Transition of Care [facility]) CM/SW Contact:  Adrian Prows, RN Phone Number: 02/20/2024, 2:25 PM   Clinical Narrative:    [name] w/ pt and dtr Jodette in room; they agree to d/c to [facility] w/ transport by Sharin Mons; awaiting updated D/C summary before calling [facility]; notified Nikki at facility; she gave RM# [phone], call report # [phone].  -1513- updated D/C summary received; [facility] called; spoke w/ Raiford Noble; D/C Summary and SNF transfer report sent via hub; no TOC needs.   Final next level of care: Skilled Nursing Facility Barriers to Discharge: No Barriers Identified   Patient Goals and CMS Choice Patient states their goals for this hospitalization and ongoing recovery are:: home CMS Medicare.gov Compare Post Acute Care list provided to:: Patient        Discharge Placement              Patient chooses bed at: [facility] Patient to be transferred to facility by: [facility] Name of family member notified: Alfonso Ramus (dtr) [phone] Patient and family notified of of transfer: 02/20/24  Discharge Plan and Services Additional resources added to the After Visit Summary for     Discharge Planning Services: CM Consult                                 Social Drivers of Health (SDOH) Interventions SDOH Screenings   Food Insecurity: No Food Insecurity (02/05/2024)  Housing: Low Risk  (02/05/2024)  Transportation Needs: No Transportation Needs (02/05/2024)  Utilities: Not At Risk (02/05/2024)  Alcohol Screen: Low Risk  (04/02/2022)  Depression (PHQ2-9): Low Risk  (01/30/2023)  Financial Resource Strain: Low Risk  (04/02/2022)  Physical Activity: Sufficiently Active (04/02/2022)  Social Connections: Moderately Isolated (02/04/2024)  Stress: No Stress Concern Present (04/02/2022)  Tobacco Use: Medium Risk (02/04/2024)     Readmission  Risk Interventions    02/05/2024   12:19 PM 10/31/2023    2:26 PM  Readmission Risk Prevention Plan  Transportation Screening Complete Complete  PCP or Specialist Appt within 3-5 Days Complete Complete  HRI or Home Care Consult Complete Complete  Social Work Consult for Recovery Care Planning/Counseling Complete Complete  Palliative Care Screening Not Applicable Not Applicable  Medication Review Oceanographer) Complete Complete

## 2024-02-20 NOTE — Progress Notes (Addendum)
 Attempted to call report, unsuccessful, no answer  Attempted to call report again at 1514, no answer

## 2024-02-20 NOTE — TOC Progression Note (Addendum)
 Transition of Care [facility]) - Progression Note    Patient Details  Name: Sherry Holloway MRN: [id number] Date of Birth: Jan 04, 1928  [facility] CM/SW Contact  Adrian Prows, RN Phone Number: 02/20/2024, 10:19 AM  Clinical Narrative:    Pt given list of bed offers; she accepted bed at [facility]; LVM for pt's dtr Alfonso Ramus at [phone]; Lowella Bandy at facility notified; also notified facility via SNF hub.  -1300- spoke w/ pt and dtr Jodette in room; pt's dtr accepts bed at [facility]; Ms Mason Jim would like to discuss pt's d/c medications and pain regimen; pt's dtr and Lowella Bandy spoke via speaker phone and her dtr is aware that consents have to be signed prior to d/c facility; Dr Maryfrances Bunnell and Garfield Cornea, RN notified via secure chat that pt's dtr would like to speak w/ them.  Expected Discharge Plan: Home/Self Care Barriers to Discharge: Continued Medical Work up  Expected Discharge Plan and Services   Discharge Planning Services: CM Consult   Living arrangements for the past 2 months: Apartment                                       Social Determinants of Health (SDOH) Interventions SDOH Screenings   Food Insecurity: No Food Insecurity (02/05/2024)  Housing: Low Risk  (02/05/2024)  Transportation Needs: No Transportation Needs (02/05/2024)  Utilities: Not At Risk (02/05/2024)  Alcohol Screen: Low Risk  (04/02/2022)  Depression (PHQ2-9): Low Risk  (01/30/2023)  Financial Resource Strain: Low Risk  (04/02/2022)  Physical Activity: Sufficiently Active (04/02/2022)  Social Connections: Moderately Isolated (02/04/2024)  Stress: No Stress Concern Present (04/02/2022)  Tobacco Use: Medium Risk (02/04/2024)    Readmission Risk Interventions    02/05/2024   12:19 PM 10/31/2023    2:26 PM  Readmission Risk Prevention Plan  Transportation Screening Complete Complete  PCP or Specialist Appt within 3-5 Days Complete Complete  HRI or Home Care Consult Complete Complete   Social Work Consult for Recovery Care Planning/Counseling Complete Complete  Palliative Care Screening Not Applicable Not Applicable  Medication Review Oceanographer) Complete Complete

## 2024-02-20 NOTE — Discharge Summary (Addendum)
 Physician Discharge Summary   Patient: Sherry Holloway MRN: [id number] DOB: January 09, 1928  Admit date:     02/04/2024  Discharge date: 02/20/24  Discharge Physician: Alberteen Sam   PCP: Donato Schultz, DO     Recommendations at discharge:  Discharged to [facility] Follow up with PCP Dr. Zola Button in 1 week after discharge from rehab for ileus Dr. Zola Button: Please resume Jardiance and PRN Bumex as appropriate See below re: spinal stimulator     Discharge Diagnoses: Principal Problem:   Ileus [facility]) Active Problems:   Hypertension   Uncontrolled type 2 diabetes mellitus with hyperglycemia, without long-term current use of insulin (HCC)   History of venous thromboembolism   Chronic pain disorder   Rash and nonspecific skin eruption   Malnutrition of moderate degree   Leukocytosis      Hospital Course: 89 y.o. F with esophageal CA in remission, hx PE, hx biliary stones s/p stent and ERCP sphincterotomy, hx chronic pancreatitis, hx chronic pain on MS Contin, DM, OSA who presented with abdominal pain, found to have ileus.  Gen Surg and GI consulted.      * Ileus (HCC) Admitted and NG tube placed.  General surgery and GI consulted.  Small bowel protocol showed passing to the colon on hospital day 2 (2/21), so NG removed and she was started on a diet.    She had a slow and poor recovery after that and was worsening by HD#9 (2/28), so the NG tube was replaced.  CT scan after that on HD#10 (3/1) showed resolving ileus, no transition point.  No other focal infection or abnormality.  TPN started 3/1. Interestingly, after the initiation of antibiotics on 2/28, she got steadily better, started to have BM, was able to advance diet.  By the time of discharge, she was off TPN, had been tolerating PO diet several days, having normal BMs.   Finally suspicion by General Surgery was that ileus developed due to Morphine.  Morphine dose was reduced slightly at  discharge.  This should be monitored and adjusted based on function.      Leukocytosis This developed around hospital day 6.  CT abdomen and pelvis showed no source.  CXR clear.  Blood cultures negative.    Her only localizing symptom was pain at her stimulator battery, but clinically this looked fine (no redness or swelling) and on imaging there was no fluid collection or stranding there (not that CT would be extremely sensitive for stimulator infection).    The case was discussed with Neurosurgery.  There is no obvious reason her stimulator would get seeded, especially after having been there and defunct for many years.  That said, she clinically improved and leukocytosis resolved with empiric Zosyn.  She completed a 7 day course of Zosyn and was asymptomatic.    After discussion with NSGY, it is clear that surgical removal of the stimulator and wires would require proning and general anesthesia. At her age, this should only be done if there is a much higher degree of certainty of stimulator infection than we have.  For now: - Monitor off antibiotics; if pain recurs and steadily worsens at the site of her stimulator, a reasonable approach would be to admit for IR aspiration of the stimulator pocket and Neurosurgery consultation - Optional outpatient NSG office appointment is also reasonable    Malnutrition of moderate degree Treated with Ensure  History of HSV Continued on famciclovir  Chronic pain disorder MS Contin  held due to ileus.    Ileus resolved and MS Contin was restarted.  Attention to function in the future and recommend titration of opiates as appropriate.  History of venous thromboembolism Continued on Xarelto  Uncontrolled type 2 diabetes mellitus with hyperglycemia, without long-term current use of insulin (HCC) Jardiance held at discharge.  Hypertension Blood pressure normal off meds            The [facility] Controlled Substances Registry was  reviewed for this patient prior to discharge.  Consultants: General surgery GI    Disposition: Skilled nursing facility Diet recommendation:  Carb modified diet  DISCHARGE MEDICATION: Allergies as of 02/20/2024       Reactions   Gramicidin Other (See Comments)   Gramicidin is an antibiotic peptide that is used to treat local infections caused by gram-positive bacteria. Unknown reaction   Sulfa Antibiotics Rash   Wound Dressing Adhesive Rash, Other (See Comments)   Some dressings break out the skin        Medication List     PAUSE taking these medications    bumetanide 1 MG tablet Wait to take this until your doctor or other care provider tells you to start again. Commonly known as: BUMEX Take 0.5-1 tablets (0.5-1 mg total) by mouth daily as needed (swelling).   Jardiance 25 MG Tabs tablet Wait to take this until your doctor or other care provider tells you to start again. Generic drug: empagliflozin TAKE 1 TABLET DAILY BEFORE BREAKFAST       STOP taking these medications    valsartan 40 MG tablet Commonly known as: DIOVAN       TAKE these medications    Creon 36000-114000 units Cpep capsule Generic drug: lipase/protease/amylase TAKE 1 CAPSULE THREE TIMES A DAY BEFORE MEALS What changed: See the new instructions.   docusate sodium 100 MG capsule Commonly known as: Colace Take 1 capsule (100 mg total) by mouth 2 (two) times daily. What changed:  how much to take when to take this additional instructions   famciclovir 125 MG tablet Commonly known as: FAMVIR TAKE 1 TABLET DAILY   feeding supplement Liqd Take 237 mLs by mouth 2 (two) times daily between meals.   FREESTYLE LITE test strip Generic drug: glucose blood CHECK BLOOD SUGAR TWICE A DAY   latanoprost 0.005 % ophthalmic solution Commonly known as: XALATAN Place 1 drop into both eyes at bedtime.   Linzess 145 MCG Caps capsule Generic drug: linaclotide TAKE 1 CAPSULE DAILY BEFORE  BREAKFAST What changed: See the new instructions.   mirtazapine 15 MG tablet Commonly known as: REMERON TAKE 1 TABLET AT BEDTIME   morphine 15 MG 12 hr tablet Commonly known as: MS CONTIN Take 1 tablet (15 mg total) by mouth every 12 (twelve) hours. What changed:  medication strength how much to take   multivitamin with minerals Tabs tablet Take 1 tablet by mouth daily with supper.   omeprazole 40 MG capsule Commonly known as: PRILOSEC TAKE 1 CAPSULE IN THE MORNING AND AT BEDTIME What changed: See the new instructions.   Senna S 8.6-50 MG tablet Generic drug: senna-docusate Take 1 tablet by mouth See admin instructions. Take 1 tablet by mouth in the morning and evening   sucralfate 1 g tablet Commonly known as: Carafate Take 1 tablet (1 g total) by mouth 2 (two) times daily.   TYLENOL 500 MG tablet Generic drug: acetaminophen Take 500-1,000 mg by mouth every 6 (six) hours as needed for mild pain (pain score  1-3) or headache.   Xarelto 20 MG Tabs tablet Generic drug: rivaroxaban TAKE 1 TABLET DAILY WITH SUPPER, START ONLY AFTER THE STARTER PACK HAS BEEN COMPLETED What changed: See the new instructions.        Contact information for follow-up providers     Care, [facility] Follow up.   Specialty: Home Health Services Why: [facility] will be providing your home health physical therapy services. Contact information: [address] [place] [address] [phone]         Seabron Spates R, DO Follow up.   Specialty: Family Medicine Why: follow up 1 week after discharge from rehab Contact information: [address] [place] [address] [phone]              Contact information for after-discharge care     Destination     [facility] Preferred SNF .   Service: Skilled Nursing Contact information: [address] [place] [address] [phone]                        Discharge Exam: Filed Weights   02/16/24 0500 02/17/24 0500 02/18/24 0415  Weight: 63.3 kg 63 kg 64.2 kg    General: Pt is alert, awake, not in acute distress, sitting up in bed Cardiovascular: RRR, nl S1-S2, no murmurs appreciated.   No LE edema.   Respiratory: Normal respiratory rate and rhythm.  CTAB without rales or wheezes. Abdominal: Abdomen soft and non-tender.  No distension or HSM.  No redness, swelling at her stimulator.  This are of the abdomen is tender to touch, but the pain resolves if she holds up the stimulator, supporting it with her hand.   Neuro/Psych: Strength symmetric in upper and lower extremities.  Judgment and insight appear normal.   Condition at discharge: fair  The results of significant diagnostics from this hospitalization (including imaging, microbiology, ancillary and laboratory) are listed below for reference.   Imaging Studies: DG Abd 2 Views Result Date: 02/17/2024 CLINICAL DATA:  Bowel obstruction. EXAM: ABDOMEN - 2 VIEW COMPARISON:  February 14, 2024. FINDINGS: Nasogastric tube tip seen in expected position of distal stomach. Common bile duct stents are noted. Status post cholecystectomy. No abnormal bowel dilatation. There appears to be contrast in the sigmoid colon. IMPRESSION: No abnormal bowel dilatation. Electronically Signed   By: Lupita Raider M.D.   On: 02/17/2024 13:36   DG Chest Port 1 View Result Date: 02/15/2024 CLINICAL DATA:  Short of breath, small-bowel obstruction, emphysema EXAM: PORTABLE CHEST 1 VIEW COMPARISON:  07/27/2023 FINDINGS: Single frontal view of the chest demonstrates an unremarkable cardiac silhouette. Enteric catheter passes below diaphragm, tip excluded by collimation but side port projecting over the gastric body. Right-sided PICC tip overlies superior vena cava. Stable spinal stimulator leads over the midthoracic central canal. No airspace disease, effusion, or pneumothorax. No acute bony abnormalities.  IMPRESSION: 1. Support devices as above. 2. No acute intrathoracic process. Electronically Signed   By: Sharlet Salina M.D.   On: 02/15/2024 14:34   DG Abd 1 View Result Date: 02/14/2024 CLINICAL DATA:  Enteric tube placement. EXAM: ABDOMEN - 1 VIEW COMPARISON:  Same day abdominal radiograph. FINDINGS: An enteric tube terminates in the stomach. IMPRESSION: Enteric tube terminates in the stomach. Electronically Signed   By: Romona Curls M.D.   On: 02/14/2024 16:56   CT ABDOMEN PELVIS W CONTRAST Result Date: 02/14/2024 CLINICAL DATA:  Abdominal pain. Peritonitis or perforation suspected. Past medical history includes pancreatic mass. Esophageal cancer. 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 Biliary obstruction. * Tracking Code: BO * EXAM: CT ABDOMEN AND PELVIS WITH CONTRAST TECHNIQUE: Multidetector CT imaging of the abdomen and pelvis was performed using the standard protocol following bolus administration of intravenous contrast. RADIATION DOSE REDUCTION: This exam was performed according to the departmental dose-optimization program which includes automated exposure control, adjustment of the mA and/or kV according to patient size and/or use of iterative reconstruction technique. CONTRAST:  30mL OMNIPAQUE IOHEXOL 300 MG/ML SOLN, OMNIPAQUE IOHEXOL 300 MG/ML SOLN COMPARISON:  02/10/2024 FINDINGS: Lower chest: Emphysema. Motion degradation at the lung bases. Bibasilar scarring. Normal heart size. Trace left pleural fluid. Multivessel coronary artery atherosclerosis. Hepatobiliary: No suspicious liver lesion. Moderate pneumobilia within the left hepatic lobe. moderate intrahepatic biliary duct dilatation. 2 common duct stents are unchanged in position. These originate in the hepatic hilum and just above the pancreatic head. Terminate within the descending duodenum. On coronal reformats, the common duct is dilated at up to 1.6 cm above the proximal most stent (39/5). Especially when compared  to the exam of 01/13/2024, the stents have been repositioned or migrated caudally today. Pancreas: Pancreatic marked parenchymal atrophy with moderate duct dilatation, similar at 8 mm. Followed to the level of the ampulla. residual cystic lesion within the pancreatic head at 3.2 x 3.6 cm and 41/2. 4.1 x 3.3 cm back on 10/28/2023 CT. Spleen: Normal in size, without focal abnormality. Adrenals/Urinary Tract: Normal adrenal glands. Normal kidneys, without hydronephrosis. The bladder is mildly distended and demonstrates hyperattenuation within, most likely residual contrast. Stomach/Bowel: Borderline gastric distension. Scattered colonic diverticula.  Normal terminal ileum. Small bowel loops measure up to 3.7 cm versus 4.3 cm at the same level on the prior. No focal transition identified. No pneumatosis or free intraperitoneal air. Vascular/Lymphatic: Advanced aortic and branch vessel atherosclerosis. No abdominopelvic adenopathy. Reproductive: Hysterectomy.  No adnexal mass. Other: No significant free fluid. Musculoskeletal: Dorsal spinal stimulator with battery pack about the low lateral right pelvic wall. Osteopenia. Left hip arthroplasty. Lumbosacral spondylosis. Convex right thoracolumbar spine curvature. IMPRESSION: 1. Common duct stents in place with pneumobilia and biliary duct dilatation. Especially when compared to 01/13/2024, the stents have been repositioned or migrated distally and the common duct dilatation above the stents is new. Consider ERCP/stent repositioning. 2. Similar pancreatic duct dilatation and upstream atrophy presumably secondary to a pancreatic head/periampullary cystic lesion which is decreased in size compared to 10/28/2023. This likely represents the intraductal papillary mucinous tumor described in EMR. 3. Improved small bowel distension without focal transition point. Favor adynamic ileus versus low-grade partial small bowel obstruction. 4. Incidental findings, including: Coronary  artery atherosclerosis. Aortic Atherosclerosis (ICD10-I70.0). Emphysema (ICD10-J43.9). Electronically Signed   By: Jeronimo Greaves M.D.   On: 02/14/2024 14:45   [place] EKG SITE RITE Result Date: 02/14/2024 If Site Rite image not attached, placement could not be confirmed due to current cardiac rhythm.  DG Abd 1 View Result Date: 02/14/2024 CLINICAL DATA:  Enteric tube placement. EXAM: ABDOMEN - 1 VIEW COMPARISON:  Abdominal radiograph performed the same day. FINDINGS: An enteric tube reaches the lower esophagus, but appears coiled on itself. Common bile duct stents are noted. Spinal stimulator leads are partially imaged. IMPRESSION: Enteric tube reaches the lower esophagus, but appears coiled on itself. This enteric tube does not appear to enter the stomach. Electronically Signed   By: Romona Curls M.D.   On: 02/14/2024 13:44   DG  Abd Portable 1V Result Date: 02/14/2024 CLINICAL DATA:  Nasogastric tube placement. EXAM: PORTABLE ABDOMEN - 1 VIEW COMPARISON:  Abdominal radiograph yesterday FINDINGS: Tip and side port of the enteric tube below the diaphragm in the stomach. Plastic biliary stent unchanged in position by radiograph. There is gaseous distention of bowel loops in the upper abdomen. Right upper quadrant surgical clips. Spinal stimulator in place. IMPRESSION: Tip and side port of the enteric tube below the diaphragm in the stomach. Electronically Signed   By: Narda Rutherford M.D.   On: 02/14/2024 12:24   DG Abd Portable 1V Result Date: 02/13/2024 CLINICAL DATA:  NG tube placement EXAM: PORTABLE ABDOMEN - 1 VIEW COMPARISON:  Earlier today at 7:57 a.m. FINDINGS: Nasogastric tube terminates at the gastric body with side port well below the gastroesophageal junction. Dorsal spinal stimulator. Common duct stent. Cholecystectomy. No gross free intraperitoneal air. Prominent gas-filled loops of upper abdominal small bowel are relatively similar. Contrast remains within the stomach. IMPRESSION: Nasogastric tube  appropriately positioned. Electronically Signed   By: Jeronimo Greaves M.D.   On: 02/13/2024 19:49   DG Abd Portable 1V Result Date: 02/13/2024 CLINICAL DATA:  Small bowel obstruction. EXAM: PORTABLE ABDOMEN - 1 VIEW COMPARISON:  Abdominal x-ray from yesterday. FINDINGS: Slight progression of oral contrast within multiple dilated small bowel loops, specifically in the right abdomen. Considerable amount of contrast remains in the gastric fundus. No significant interval change in oral contrast and prominent stool in the left colon. No definite oral contrast in the right colon. IMPRESSION: 1. Slow progression of oral contrast within multiple dilated small bowel loops, consistent with ileus or partial obstruction. Electronically Signed   By: Obie Dredge M.D.   On: 02/13/2024 12:06   DG Abd Portable 1V-Small Bowel Obstruction Protocol-initial, 8 hr delay Result Date: 02/13/2024 CLINICAL DATA:  8 hour small-bowel follow-through EXAM: PORTABLE ABDOMEN - 1 VIEW COMPARISON:  Film from earlier in the same day. FINDINGS: Contrast is noted within the stomach. Scattered contrast is noted throughout the small bowel. Previously seen contrast in the colon is again identified. No right sided colonic contrast is seen to suggest adequate passage. Biliary stent and stimulator wires are seen. No free air is noted. IMPRESSION: No contrast is noted within the right colon from the recently administered contrast. 24 hour follow-up film is recommended. Electronically Signed   By: Alcide Clever M.D.   On: 02/13/2024 00:14   DG Abd Portable 1V Result Date: 02/12/2024 CLINICAL DATA:  Nausea, vomiting. EXAM: PORTABLE ABDOMEN - 1 VIEW COMPARISON:  February 08, 2024. FINDINGS: No abnormal bowel dilatation is noted. Moderate amount of stool seen throughout colon. Common bile duct stents are noted. Oral contrast is seen in the sigmoid colon and rectum. IMPRESSION: No abnormal bowel dilatation.  Moderate stool burden. Electronically Signed    By: Lupita Raider M.D.   On: 02/12/2024 15:00   CT ABDOMEN PELVIS W CONTRAST Result Date: 02/10/2024 CLINICAL DATA:  Abdominal pain, resolved small-bowel obstruction now with multiple loose stools EXAM: CT ABDOMEN AND PELVIS WITH CONTRAST TECHNIQUE: Multidetector CT imaging of the abdomen and pelvis was performed using the standard protocol following bolus administration of intravenous contrast. RADIATION DOSE REDUCTION: This exam was performed according to the departmental dose-optimization program which includes automated exposure control, adjustment of the mA and/or kV according to patient size and/or use of iterative reconstruction technique. CONTRAST:  OMNIPAQUE IOHEXOL 300 MG/ML  SOLN COMPARISON:  02/08/2024, 02/04/2024 FINDINGS: Lower chest: Mild hypoventilatory changes at the lung  bases. Hepatobiliary: There is persistent biliary duct dilation and pneumobilia, with indwelling common bile stent duct stents in stable position. There is reflux of oral contrast into the common bile duct. No evidence of choledocholithiasis. Prior cholecystectomy. Liver is otherwise unremarkable. Pancreas: Stable diffuse pancreatic parenchymal atrophy. Stable pancreatic ductal dilation measuring up to 11 mm. No acute inflammatory changes. Spleen: Normal in size without focal abnormality. Adrenals/Urinary Tract: The adrenals are stable. Bilateral renal cortical thinning. Otherwise the kidneys enhance normally. No urinary tract calculi or obstruction. Bladder is unremarkable. Stomach/Bowel: There is mild diffuse dilation of the small bowel, with progression of oral contrast throughout the colon. Maximal diameter of the jejunum measures 4.3 cm, previously measuring up to 4.8 cm. Moderate retained stool within the distal colon. No bowel wall thickening or inflammatory change. Vascular/Lymphatic: Aortic atherosclerosis. No enlarged abdominal or pelvic lymph nodes. Reproductive: Status post hysterectomy. No adnexal masses.  Other: No free fluid or free intraperitoneal gas. No abdominal wall hernia. Musculoskeletal: Stable spinal stimulator. No acute or destructive bony abnormalities. Unremarkable left arthroplasty. IMPRESSION: 1. Diffuse dilation of the small bowel, decreasing caliber since prior study. There is no transition point on this exam, and oral contrast has progressed into the colon excluding high-grade obstruction. Findings could reflect intermittent or resolving small bowel obstruction. Continued follow-up recommended. 2. Moderate stool within the distal colon. 3. Continued intrahepatic and extrahepatic biliary duct dilation and pancreatic duct dilation, with stable position of indwelling bile duct stents. Stable pneumobilia consistent with stent patency. 4.  Aortic Atherosclerosis (ICD10-I70.0). Electronically Signed   By: Sharlet Salina M.D.   On: 02/10/2024 15:08   DG Abd 2 Views Result Date: 02/08/2024 CLINICAL DATA:  Abdominal distention. EXAM: ABDOMEN - 2 VIEW COMPARISON:  KUB 02/07/2024, 02/06/2024, 02/05/2024, CT abdomen pelvis 02/04/2024 FINDINGS: Note is made of small bowel obstruction in the superior right pelvis on 02/04/2024 CT. Two biliary stents are again noted. There is again a generator pack overlying the right flank and spinal cord stimulator leads overlying the central canal of the upper lumbar spine extending off the superior thoracic spine plane of view. Right upper quadrant cholecystectomy clips. Oral contrast is seen within the distal transverse colon and descending colon, similar to prior but likely mildly progressed into the sigmoid colon. Stool is seen within the distal sigmoid colon and rectum. No portal venous gas or pneumatosis. Following bases are clear. Moderate dextrocurvature of the upper lumbar spine with mild-to-moderate multilevel degenerative disc changes. Partial visualization of total left hip arthroplasty. IMPRESSION: Oral contrast is seen within the distal transverse colon and  descending colon, similar to prior but likely mildly progressed into the sigmoid colon. Findings again suggest slow progression of oral contrast and improvement in the small bowel obstruction seen on 02/04/2024 CT. Electronically Signed   By: Neita Garnet M.D.   On: 02/08/2024 14:00   DG Abd 1 View Result Date: 02/07/2024 CLINICAL DATA:  Small bowel obstruction EXAM: ABDOMEN - 1 VIEW COMPARISON:  02/06/2024 FINDINGS: Two biliary stents are noted. Oral contrast has progressed somewhat through the colon. No clear evidence of bowel obstruction. NG tube is been removed. IMPRESSION: 1. Some progression of oral contrast in the colon. 2. No evidence of high-grade bowel obstruction. 3. Removal of NG tube. Electronically Signed   By: Genevive Bi M.D.   On: 02/07/2024 14:52   DG Abd Portable 1V Result Date: 02/06/2024 CLINICAL DATA:  [id number] SBO (small bowel obstruction) (HCC) [id number] EXAM: PORTABLE ABDOMEN - 1 VIEW COMPARISON:  Abdominal radiograph from  earlier today FINDINGS: Interval antegrade transit of oral contrast to the distal colon. Mildly dilated small bowel loops up to the 3 cm diameter, slightly improved. No evidence of pneumatosis or pneumoperitoneum. Right lower lateral abdominal wall spinal stimulator with leads coursing into the thoracic spinal canal. Enteric tube tip in the body of the stomach. Biliary stents noted in the right upper quadrant. Partially visualized left total hip arthroplasty. IMPRESSION: Findings compatible with resolving small bowel obstruction with oral contrast transits to the distal colon. Electronically Signed   By: Delbert Phenix M.D.   On: 02/06/2024 08:15   DG Abd Portable 1V-Small Bowel Obstruction Protocol-initial, 8 hr delay Result Date: 02/06/2024 CLINICAL DATA:  Small bowel protocol EXAM: PORTABLE ABDOMEN - 1 VIEW COMPARISON:  Plain films earlier today.  CT 02/04/2024 FINDINGS: NG tube is in the stomach. Contrast material noted within small bowel loops within the right  abdomen. Several small bowel loops remain dilated. No definite opacification of the large bowel currently. IMPRESSION: Contrast material within right small bowel loops without definite passage into colon currently. Mildly prominent small bowel loops remain compatible with small bowel obstruction. Electronically Signed   By: Charlett Nose M.D.   On: 02/06/2024 00:21   DG Abd Portable 1V Result Date: 02/05/2024 CLINICAL DATA:  NG placement. EXAM: PORTABLE ABDOMEN - 1 VIEW COMPARISON:  CT abdomen pelvis dated 02/04/2024. FINDINGS: Enteric tube with side port and tip in the proximal stomach. Recommend further advancing by additional 5 cm for optimal positioning. IMPRESSION: Enteric tube with tip in the proximal stomach. Recommend further advancing by additional 5 cm for optimal positioning. Electronically Signed   By: Elgie Collard M.D.   On: 02/05/2024 14:54   DG Abd 1 View Result Date: 02/05/2024 CLINICAL DATA:  NG placement. EXAM: ABDOMEN - 1 VIEW COMPARISON:  CT abdomen pelvis dated 02/04/2024. FINDINGS: Enteric tube with side-port just distal to the GE junction and tip in the proximal stomach. Recommend further advancing by additional 5 cm for optimal positioning. IMPRESSION: Enteric tube with tip in the proximal stomach. Recommend further advancing by additional 5 cm. Electronically Signed   By: Elgie Collard M.D.   On: 02/05/2024 13:13   CT ABDOMEN PELVIS W CONTRAST Result Date: 02/04/2024 CLINICAL DATA:  Right upper quadrant pain. EXAM: CT ABDOMEN AND PELVIS WITH CONTRAST TECHNIQUE: Multidetector CT imaging of the abdomen and pelvis was performed using the standard protocol following bolus administration of intravenous contrast. RADIATION DOSE REDUCTION: This exam was performed according to the departmental dose-optimization program which includes automated exposure control, adjustment of the mA and/or kV according to patient size and/or use of iterative reconstruction technique. CONTRAST:   OMNIPAQUE IOHEXOL 300 MG/ML  SOLN COMPARISON:  01/13/2024. FINDINGS: Lower chest: Scarring in the lung bases. Heart is at the upper limits of normal in size. No pericardial or pleural effusion. Atherosclerotic calcification of the aorta. Distal esophagus is grossly unremarkable. Hepatobiliary: Extensive pneumobilia with biliary stents in place, unchanged from 01/13/2024. Liver is slightly decreased in attenuation diffusely with probable scattered hyperdense perfusion anomalies. Cholecystectomy. Pancreas: Marked pancreatic ductal dilatation, 10 mm, stable. Pancreatic atrophy and calcifications Spleen: Negative. Adrenals/Urinary Tract: Adrenal glands and kidneys are unremarkable. Ureters are decompressed. Bladder is grossly unremarkable. Stomach/Bowel: Dilated stomach, duodenum and jejunum with an apparent transition point in the right anatomic pelvis (301/49). Remainder of the small bowel is decompressed. Colon is grossly unremarkable. Vascular/Lymphatic: Atherosclerotic calcification of the aorta. No pathologically enlarged lymph nodes. Reproductive: Hysterectomy.  No adnexal mass. Other: No free  fluid.  Mesenteries and peritoneum are unremarkable. Musculoskeletal: Left hip arthroplasty. Osteopenia. Degenerative changes in the spine. Spinal stimulator wires. Dextroconvex scoliosis. IMPRESSION: 1. Small-bowel obstruction with transition point in the right anatomic pelvis. 2. Liver appears mildly steatotic. 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 3. Chronic pancreatic ductal dilatation. Chronic calcific pancreatitis. 4.  Aortic atherosclerosis (ICD10-I70.0). Electronically Signed   By: Leanna Battles M.D.   On: 02/04/2024 17:07    Microbiology: Results for orders placed or performed during the hospital encounter of 02/04/24  C Difficile Quick Screen (NO PCR Reflex)     Status: None   Collection Time: 02/09/24  1:48 PM   Specimen: STOOL  Result Value Ref  Range Status   C Diff antigen NEGATIVE NEGATIVE Final   C Diff toxin NEGATIVE NEGATIVE Final   C Diff interpretation No C. difficile detected.  Final    Comment: Performed at [facility], [address] [place], [place] [address]  Culture, blood (Routine X 2) w Reflex to ID Panel     Status: None   Collection Time: 02/14/24  4:35 PM   Specimen: BLOOD LEFT ARM  Result Value Ref Range Status   Specimen Description   Final    BLOOD LEFT ARM Performed at [facility], [address], [place], [place] [address]    Special Requests   Final    BOTTLES DRAWN AEROBIC ONLY Blood Culture results may not be optimal due to an inadequate volume of blood received in culture bottles Performed at [facility], [address], [place], [place] [address]    Culture   Final    NO GROWTH 5 DAYS Performed at [facility], [address] [place], [place] [address]    Report Status 02/19/2024 FINAL  Final  Culture, blood (Routine X 2) w Reflex to ID Panel     Status: None   Collection Time: 02/14/24  4:45 PM   Specimen: BLOOD  Result Value Ref Range Status   Specimen Description   Final    BLOOD Performed at [facility], [address] [place], [place] [address]    Special Requests   Final    BOTTLES DRAWN AEROBIC AND ANAEROBIC Blood Culture results may not be optimal due to an inadequate volume of blood received in culture bottles Performed at [facility], [address] [place], [place] [address]    Culture   Final    NO GROWTH 5 DAYS Performed at [facility], [address], [place], [place] [address]    Report Status 02/19/2024 FINAL  Final    Labs: CBC: Recent Labs  Lab 02/14/24 0522 02/15/24 0429 02/16/24 0216 02/17/24 0450 02/18/24 0259 02/19/24 0314  WBC 29.9* 29.3* 20.0* 12.1* 9.7 8.8  NEUTROABS 27.3*  --  17.7*  --   --   --   HGB 11.1* 10.6* 9.4* 10.1* 9.7* 10.0*  HCT 35.9*  33.9* 29.9* 33.4* 31.0* 32.3*  MCV 87.8 86.7 85.9 88.8 85.9 87.3  PLT 317 324 270 240 226 214   Basic Metabolic Panel: Recent Labs  Lab 02/14/24 0522 02/15/24 0429 02/16/24 0216 02/17/24 0450 02/18/24 0259 02/19/24 0314  NA 137 135 135 134* 132* 137  K 3.7 3.1* 3.0* 4.1 4.5 4.0  CL 103 100 102 102 99 102  CO2 23 22 27 26 28 28   GLUCOSE 176* 106* 193* 182* 191* 192*  BUN  27* 27* 25* 18 15 12   CREATININE 0.54 0.49 0.47 0.38* 0.45 0.51  CALCIUM 9.5 9.5 9.0 8.2* 8.2* 8.6*  MG 1.9 1.9 2.4 1.9 2.4  --   PHOS 2.5 2.6 1.9* 2.2* 3.0  --    Liver Function Tests: Recent Labs  Lab 02/14/24 0522 02/15/24 0429 02/16/24 0216  AST 10* 10* 12*  ALT 15 15 15   ALKPHOS 80 80 70  BILITOT 0.8 0.8 0.5  PROT 5.4* 5.2* 4.7*  ALBUMIN 2.1* 2.0* 1.7*   CBG: Recent Labs  Lab 02/18/24 1313 02/18/24 1720 02/18/24 2043 02/19/24 0739 02/19/24 1236  GLUCAP 251* 212* 222* 158* 185*    Discharge time spent: approximately 45 minutes spent on discharge counseling, evaluation of patient on day of discharge, and coordination of discharge planning with nursing, social work, pharmacy and case management  Signed: Alberteen Sam, MD [facility] 02/20/2024

## 2024-02-20 NOTE — Progress Notes (Signed)
 OT Cancellation Note  Patient Details Name: Sherry Holloway MRN: [id number] DOB: 08/17/1928   Cancelled Treatment:    Reason Eval/Treat Not Completed: Fatigue/lethargy limiting ability to participate.  Attempted skilled OT treatment session.  Pt. States "I know I should but I'm just not up to it".  States she may be able to participate.  Reviewed would check back as schedule allows. Pt. Verbalized understanding.    Cathie Hoops, COTA/L Acute Rehabilitation [phone]   02/20/2024, 1:04 PM

## 2024-02-23 NOTE — Consult Note (Signed)
.  [facility] Liaison Consult Note   02/23/2024  Lucillie Kiesel Payette 11-Mar-1928 [id number]  Follow up:  DC to SNF to [employer]:  Medicare ACO  [facility] Liaison remote coverage review for patient admitted to [facility]    PCP: Zola Button, Grayling Congress, DO with [facility], SW is listed for the [facility] follow up.  Patient transitioned to [facility], will alert [facility] RN of disposition for potential follow up needs when returning to community.  Charlesetta Shanks, RN, BSN, CCM [employer], [facility] Liaison Direct Dial: [phone] or secure chat Email: [email or web address]

## 2024-02-25 ENCOUNTER — Other Ambulatory Visit: Payer: Self-pay | Admitting: *Deleted

## 2024-02-25 NOTE — Patient Outreach (Signed)
 Sherry Holloway recently admitted to [facility]. Screening for potential complex care management services as benefit of health plan and primary care provider.  Collaborative meeting with [employer] social worker to make aware Clinical research associate will follow for potential complex care management needs.   Raiford Noble, MSN, RN, BSN [place]  [facility], [facility] RN Post- Acute Care Manager Direct Dial: [phone]

## 2024-03-03 ENCOUNTER — Other Ambulatory Visit: Payer: Self-pay | Admitting: *Deleted

## 2024-03-03 NOTE — Patient Outreach (Signed)
 Post- Acute Care Manager follow up. Screening for potential complex care management services as benefit of health plan and primary care provider.  Collaboration meeting with [employer] Child psychotherapist. Transition plan is to return home with daughter Michael Litter.   Will continue to follow and plan outreach to daughter as appropriate.   Raiford Noble, MSN, RN, BSN [place]  [facility], [facility] RN Post- Acute Care Manager Direct Dial: [phone]

## 2024-03-05 ENCOUNTER — Telehealth: Payer: Self-pay | Admitting: *Deleted

## 2024-03-05 NOTE — Telephone Encounter (Signed)
 Copied from CRM [id number]. Topic: General - Other >> Mar 04, 2024  3:06 PM Florestine Avers wrote: Reason for CRM: Provider at [facility] called and wanted to inform Dr. Zola Button that the patient will be going home soon and wants to go over a list of the patients medications. Dr. Molli Posey can be reached at [phone] (cell number).

## 2024-03-08 ENCOUNTER — Emergency Department (HOSPITAL_COMMUNITY)

## 2024-03-08 ENCOUNTER — Other Ambulatory Visit: Payer: Self-pay

## 2024-03-08 ENCOUNTER — Inpatient Hospital Stay (HOSPITAL_COMMUNITY)
Admission: EM | Admit: 2024-03-08 | Discharge: 2024-03-16 | DRG: 871 | Disposition: E | Source: Skilled Nursing Facility | Attending: Pulmonary Disease | Admitting: Pulmonary Disease

## 2024-03-08 ENCOUNTER — Encounter (HOSPITAL_COMMUNITY): Payer: Self-pay

## 2024-03-08 DIAGNOSIS — Z833 Family history of diabetes mellitus: Secondary | ICD-10-CM

## 2024-03-08 DIAGNOSIS — Z7952 Long term (current) use of systemic steroids: Secondary | ICD-10-CM

## 2024-03-08 DIAGNOSIS — Z86718 Personal history of other venous thrombosis and embolism: Secondary | ICD-10-CM

## 2024-03-08 DIAGNOSIS — C159 Malignant neoplasm of esophagus, unspecified: Secondary | ICD-10-CM | POA: Diagnosis present

## 2024-03-08 DIAGNOSIS — E1165 Type 2 diabetes mellitus with hyperglycemia: Secondary | ICD-10-CM | POA: Diagnosis present

## 2024-03-08 DIAGNOSIS — D684 Acquired coagulation factor deficiency: Secondary | ICD-10-CM | POA: Diagnosis present

## 2024-03-08 DIAGNOSIS — Z7984 Long term (current) use of oral hypoglycemic drugs: Secondary | ICD-10-CM

## 2024-03-08 DIAGNOSIS — E785 Hyperlipidemia, unspecified: Secondary | ICD-10-CM | POA: Diagnosis present

## 2024-03-08 DIAGNOSIS — K567 Ileus, unspecified: Secondary | ICD-10-CM | POA: Diagnosis present

## 2024-03-08 DIAGNOSIS — Z9109 Other allergy status, other than to drugs and biological substances: Secondary | ICD-10-CM

## 2024-03-08 DIAGNOSIS — Z515 Encounter for palliative care: Secondary | ICD-10-CM

## 2024-03-08 DIAGNOSIS — Z79899 Other long term (current) drug therapy: Secondary | ICD-10-CM

## 2024-03-08 DIAGNOSIS — K861 Other chronic pancreatitis: Secondary | ICD-10-CM | POA: Diagnosis present

## 2024-03-08 DIAGNOSIS — G8929 Other chronic pain: Secondary | ICD-10-CM | POA: Diagnosis present

## 2024-03-08 DIAGNOSIS — Z8501 Personal history of malignant neoplasm of esophagus: Secondary | ICD-10-CM

## 2024-03-08 DIAGNOSIS — K56609 Unspecified intestinal obstruction, unspecified as to partial versus complete obstruction: Principal | ICD-10-CM | POA: Diagnosis present

## 2024-03-08 DIAGNOSIS — K7200 Acute and subacute hepatic failure without coma: Secondary | ICD-10-CM | POA: Diagnosis present

## 2024-03-08 DIAGNOSIS — T45515A Adverse effect of anticoagulants, initial encounter: Secondary | ICD-10-CM | POA: Diagnosis present

## 2024-03-08 DIAGNOSIS — K3189 Other diseases of stomach and duodenum: Secondary | ICD-10-CM | POA: Diagnosis present

## 2024-03-08 DIAGNOSIS — N179 Acute kidney failure, unspecified: Secondary | ICD-10-CM | POA: Diagnosis present

## 2024-03-08 DIAGNOSIS — Z66 Do not resuscitate: Secondary | ICD-10-CM | POA: Diagnosis present

## 2024-03-08 DIAGNOSIS — Z7901 Long term (current) use of anticoagulants: Secondary | ICD-10-CM

## 2024-03-08 DIAGNOSIS — Z90710 Acquired absence of both cervix and uterus: Secondary | ICD-10-CM

## 2024-03-08 DIAGNOSIS — M549 Dorsalgia, unspecified: Secondary | ICD-10-CM | POA: Diagnosis present

## 2024-03-08 DIAGNOSIS — D689 Coagulation defect, unspecified: Secondary | ICD-10-CM | POA: Diagnosis present

## 2024-03-08 DIAGNOSIS — R54 Age-related physical debility: Secondary | ICD-10-CM | POA: Diagnosis present

## 2024-03-08 DIAGNOSIS — R6521 Severe sepsis with septic shock: Secondary | ICD-10-CM | POA: Diagnosis present

## 2024-03-08 DIAGNOSIS — E872 Acidosis, unspecified: Secondary | ICD-10-CM | POA: Diagnosis present

## 2024-03-08 DIAGNOSIS — I10 Essential (primary) hypertension: Secondary | ICD-10-CM | POA: Diagnosis present

## 2024-03-08 DIAGNOSIS — Z9049 Acquired absence of other specified parts of digestive tract: Secondary | ICD-10-CM

## 2024-03-08 DIAGNOSIS — E871 Hypo-osmolality and hyponatremia: Secondary | ICD-10-CM | POA: Diagnosis present

## 2024-03-08 DIAGNOSIS — Z96651 Presence of right artificial knee joint: Secondary | ICD-10-CM | POA: Diagnosis present

## 2024-03-08 DIAGNOSIS — Z79891 Long term (current) use of opiate analgesic: Secondary | ICD-10-CM | POA: Diagnosis not present

## 2024-03-08 DIAGNOSIS — Z882 Allergy status to sulfonamides status: Secondary | ICD-10-CM

## 2024-03-08 DIAGNOSIS — Z87891 Personal history of nicotine dependence: Secondary | ICD-10-CM | POA: Diagnosis not present

## 2024-03-08 DIAGNOSIS — G4733 Obstructive sleep apnea (adult) (pediatric): Secondary | ICD-10-CM | POA: Diagnosis present

## 2024-03-08 DIAGNOSIS — R100 Acute abdomen: Secondary | ICD-10-CM

## 2024-03-08 DIAGNOSIS — A419 Sepsis, unspecified organism: Principal | ICD-10-CM | POA: Diagnosis present

## 2024-03-08 DIAGNOSIS — R0682 Tachypnea, not elsewhere classified: Secondary | ICD-10-CM | POA: Diagnosis present

## 2024-03-08 DIAGNOSIS — Z8542 Personal history of malignant neoplasm of other parts of uterus: Secondary | ICD-10-CM

## 2024-03-08 DIAGNOSIS — Z881 Allergy status to other antibiotic agents status: Secondary | ICD-10-CM

## 2024-03-08 DIAGNOSIS — Z8249 Family history of ischemic heart disease and other diseases of the circulatory system: Secondary | ICD-10-CM | POA: Diagnosis not present

## 2024-03-08 DIAGNOSIS — R109 Unspecified abdominal pain: Secondary | ICD-10-CM | POA: Diagnosis present

## 2024-03-08 DIAGNOSIS — M1990 Unspecified osteoarthritis, unspecified site: Secondary | ICD-10-CM | POA: Diagnosis present

## 2024-03-08 DIAGNOSIS — K219 Gastro-esophageal reflux disease without esophagitis: Secondary | ICD-10-CM | POA: Diagnosis present

## 2024-03-08 LAB — CBC WITH DIFFERENTIAL/PLATELET
Abs Immature Granulocytes: 0.92 10*3/uL — ABNORMAL HIGH (ref 0.00–0.07)
Basophils Absolute: 0 10*3/uL (ref 0.0–0.1)
Basophils Relative: 0 %
Eosinophils Absolute: 0 10*3/uL (ref 0.0–0.5)
Eosinophils Relative: 0 %
HCT: 33.8 % — ABNORMAL LOW (ref 36.0–46.0)
Hemoglobin: 10.5 g/dL — ABNORMAL LOW (ref 12.0–15.0)
Immature Granulocytes: 2 %
Lymphocytes Relative: 1 %
Lymphs Abs: 0.5 10*3/uL — ABNORMAL LOW (ref 0.7–4.0)
MCH: 26.1 pg (ref 26.0–34.0)
MCHC: 31.1 g/dL (ref 30.0–36.0)
MCV: 84.1 fL (ref 80.0–100.0)
Monocytes Absolute: 1.4 10*3/uL — ABNORMAL HIGH (ref 0.1–1.0)
Monocytes Relative: 3 %
Neutro Abs: 37.5 10*3/uL — ABNORMAL HIGH (ref 1.7–7.7)
Neutrophils Relative %: 94 %
Platelets: 385 10*3/uL (ref 150–400)
RBC: 4.02 MIL/uL (ref 3.87–5.11)
RDW: 15.6 % — ABNORMAL HIGH (ref 11.5–15.5)
WBC: 40.4 10*3/uL — ABNORMAL HIGH (ref 4.0–10.5)
nRBC: 0 % (ref 0.0–0.2)

## 2024-03-08 LAB — COMPREHENSIVE METABOLIC PANEL
ALT: 115 U/L — ABNORMAL HIGH (ref 0–44)
AST: 275 U/L — ABNORMAL HIGH (ref 15–41)
Albumin: 2 g/dL — ABNORMAL LOW (ref 3.5–5.0)
Alkaline Phosphatase: 123 U/L (ref 38–126)
Anion gap: 22 — ABNORMAL HIGH (ref 5–15)
BUN: 36 mg/dL — ABNORMAL HIGH (ref 8–23)
CO2: 16 mmol/L — ABNORMAL LOW (ref 22–32)
Calcium: 9.3 mg/dL (ref 8.9–10.3)
Chloride: 93 mmol/L — ABNORMAL LOW (ref 98–111)
Creatinine, Ser: 1.05 mg/dL — ABNORMAL HIGH (ref 0.44–1.00)
GFR, Estimated: 49 mL/min — ABNORMAL LOW (ref >60)
Glucose, Bld: 228 mg/dL — ABNORMAL HIGH (ref 70–99)
Potassium: 3.9 mmol/L (ref 3.5–5.1)
Sodium: 131 mmol/L — ABNORMAL LOW (ref 135–145)
Total Bilirubin: 0.6 mg/dL (ref 0.0–1.2)
Total Protein: 5.5 g/dL — ABNORMAL LOW (ref 6.5–8.1)

## 2024-03-08 LAB — I-STAT CHEM 8, ED
BUN: 32 mg/dL — ABNORMAL HIGH (ref 8–23)
Calcium, Ion: 1.15 mmol/L (ref 1.15–1.40)
Chloride: 94 mmol/L — ABNORMAL LOW (ref 98–111)
Creatinine, Ser: 1 mg/dL (ref 0.44–1.00)
Glucose, Bld: 216 mg/dL — ABNORMAL HIGH (ref 70–99)
HCT: 34 % — ABNORMAL LOW (ref 36.0–46.0)
Hemoglobin: 11.6 g/dL — ABNORMAL LOW (ref 12.0–15.0)
Potassium: 3.9 mmol/L (ref 3.5–5.1)
Sodium: 127 mmol/L — ABNORMAL LOW (ref 135–145)
TCO2: 16 mmol/L — ABNORMAL LOW (ref 22–32)

## 2024-03-08 LAB — CBG MONITORING, ED: Glucose-Capillary: 199 mg/dL — ABNORMAL HIGH (ref 70–99)

## 2024-03-08 LAB — PROTIME-INR
INR: >10 (ref 0.8–1.2)
INR: >10 (ref 0.8–1.2)
Prothrombin Time: >90 s — ABNORMAL HIGH (ref 11.4–15.2)
Prothrombin Time: >90 s — ABNORMAL HIGH (ref 11.4–15.2)

## 2024-03-08 LAB — GLUCOSE, CAPILLARY: Glucose-Capillary: 119 mg/dL — ABNORMAL HIGH (ref 70–99)

## 2024-03-08 LAB — MRSA NEXT GEN BY PCR, NASAL: MRSA by PCR Next Gen: NOT DETECTED

## 2024-03-08 LAB — I-STAT CG4 LACTIC ACID, ED
Lactic Acid, Venous: 13.2 mmol/L (ref 0.5–1.9)
Lactic Acid, Venous: 12.5 mmol/L (ref 0.5–1.9)

## 2024-03-08 LAB — LIPASE, BLOOD: Lipase: 26 U/L (ref 11–51)

## 2024-03-08 MED ORDER — POLYETHYLENE GLYCOL 3350 17 G PO PACK: 17.0000 g | PACK | Freq: Every day | ORAL | Status: DC | PRN

## 2024-03-08 MED ORDER — INSULIN ASPART 100 UNIT/ML IJ SOLN
0.0000 [IU] | INTRAMUSCULAR | Status: DC
  Administered 2024-03-08: 2 [IU] via SUBCUTANEOUS
  Administered 2024-03-09 (×2): 1 [IU] via SUBCUTANEOUS
  Filled 2024-03-08: qty 0.09

## 2024-03-08 MED ORDER — METRONIDAZOLE 500 MG/100ML IV SOLN
500.0000 mg | Freq: Once | INTRAVENOUS | Status: AC
  Administered 2024-03-08: 500 mg via INTRAVENOUS
  Filled 2024-03-08: qty 100

## 2024-03-08 MED ORDER — CHLORHEXIDINE GLUCONATE CLOTH 2 % EX PADS
6.0000 | MEDICATED_PAD | Freq: Every day | CUTANEOUS | Status: DC
  Administered 2024-03-08: 6 via TOPICAL

## 2024-03-08 MED ORDER — HYDROMORPHONE HCL 1 MG/ML IJ SOLN
0.5000 mg | Freq: Once | INTRAMUSCULAR | Status: AC
  Administered 2024-03-08: 0.5 mg via INTRAVENOUS
  Filled 2024-03-08: qty 1

## 2024-03-08 MED ORDER — ONDANSETRON HCL 4 MG/2ML IJ SOLN
4.0000 mg | Freq: Once | INTRAMUSCULAR | Status: AC
  Administered 2024-03-08: 4 mg via INTRAVENOUS
  Filled 2024-03-08: qty 2

## 2024-03-08 MED ORDER — IOHEXOL 300 MG/ML  SOLN
100.0000 mL | Freq: Once | INTRAMUSCULAR | Status: AC | PRN
  Administered 2024-03-08: 100 mL via INTRAVENOUS

## 2024-03-08 MED ORDER — LACTATED RINGERS IV SOLN: INTRAVENOUS | Status: DC

## 2024-03-08 MED ORDER — METOCLOPRAMIDE HCL 5 MG/ML IJ SOLN
5.0000 mg | Freq: Once | INTRAMUSCULAR | Status: AC
  Administered 2024-03-08: 5 mg via INTRAVENOUS
  Filled 2024-03-08: qty 2

## 2024-03-08 MED ORDER — SODIUM CHLORIDE 0.9 % IV SOLN
2.0000 g | Freq: Once | INTRAVENOUS | Status: AC
  Administered 2024-03-08: 2 g via INTRAVENOUS
  Filled 2024-03-08: qty 12.5

## 2024-03-08 MED ORDER — PIPERACILLIN-TAZOBACTAM 3.375 G IVPB
3.3750 g | Freq: Three times a day (TID) | INTRAVENOUS | Status: DC
  Administered 2024-03-09: 3.375 g via INTRAVENOUS
  Filled 2024-03-08 (×2): qty 50

## 2024-03-08 MED ORDER — DOCUSATE SODIUM 100 MG PO CAPS
100.0000 mg | ORAL_CAPSULE | Freq: Two times a day (BID) | ORAL | Status: DC | PRN
Start: 2024-03-08 — End: 2024-03-08

## 2024-03-08 MED ORDER — LACTATED RINGERS IV BOLUS
1000.0000 mL | Freq: Once | INTRAVENOUS | Status: AC
  Administered 2024-03-08: 1000 mL via INTRAVENOUS

## 2024-03-08 NOTE — Consult Note (Signed)
 Harveen Flesch Carchi 07-Dec-1928  [id number].    Requesting MD: Jasmine Pang, PA-C Chief Complaint/Reason for Consult: SBO  HPI:  Ms. Starzyk is a 89 yo female with a history of chronic back pain (on MS Contin), chronic constipation, DM, pancreatic IPMN with biliary obstruction (s/p ERCP with biliary stent placement), and esophageal cancer (in remission) who presented to the ED today with abdominal pan, nausea and vomiting. She was tachycardic on arrival, with a WBC of 40, lactate of 13, mildly elevated transaminases, and INR>10. A CT scan showed gastric distension, as well as diffuse small bowel distension without a transition point. General surgery was consulted. Of note, the patient was admitted last month with similar symptoms and concerns for a bowel obstruction. She did not have a distinct transition point on imaging, and was treated nonoperatively with NG decompression and the gastrografin protocol. She had return of bowel function and was felt to have an ileus. She was discharge to a SNF.  Today her daughter reports she has had intermittent nausea and vomiting over the last few weeks. She sometimes eats small amounts, and most recently at yesterday. Today she has had worsening nausea and vomiting, with increased abdominal distension. She also reports pain. She has a history of chronic constipation, but her daughter feels this is generally well-managed with her bowel regimen.  Prior abdominal surgeries include an appendectomy, hysterectomy and cholecystectomy. 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  ROS: Review of Systems  Constitutional:  Negative for chills and fever.  Respiratory:  Negative for shortness of breath.   Gastrointestinal:  Positive for abdominal pain, constipation, nausea and vomiting. Negative for blood in stool.    Family History  Problem Relation Age of Onset   Diabetes Mother    Heart disease Father    Lung cancer Sister    Hypertension Daughter    Diabetes  Daughter    Hypertension Daughter    Lung cancer Son    Hypertension Son    Diabetes Son    Hypertension Son    Colon cancer Neg Hx    Esophageal cancer Neg Hx    Inflammatory bowel disease Neg Hx    Liver disease Neg Hx    Pancreatic cancer Neg Hx    Rectal cancer Neg Hx    Stomach cancer Neg Hx     Past Medical History:  Diagnosis Date   Arthritis    Choledocholithiasis    Diabetes mellitus without complication (HCC)    Diverticulitis    Esophageal cancer (HCC) dx'd 2020   GERD (gastroesophageal reflux disease)    Hyperlipidemia    Hypertension    OSA (obstructive sleep apnea)    Urine incontinence    Uterine cancer (HCC)     Past Surgical History:  Procedure Laterality Date   ABDOMINAL HYSTERECTOMY  1972   APPENDECTOMY     BALLOON DILATION N/A 10/29/2023   Procedure: Billiary BALLOON DILATION;  Surgeon: Lemar Lofty., MD;  Location: [facility] ENDOSCOPY;  Service: Gastroenterology;  Laterality: N/A;   BILIARY DILATION  03/05/2021   Procedure: BILIARY DILATION;  Surgeon: Meridee Score Netty Starring., MD;  Location: [facility];  Service: Gastroenterology;;   BILIARY DILATION  11/27/2021   Procedure: BILIARY DILATION;  Surgeon: Rachael Fee, MD;  Location: [facility] ENDOSCOPY;  Service: Endoscopy;;   BILIARY DILATION  02/11/2022   Procedure: BILIARY DILATION;  Surgeon: Lemar Lofty., MD;  Location: [facility]OSCOPY;  Service: Gastroenterology;;   BILIARY STENT PLACEMENT  11/27/2021  Procedure: BILIARY STENT PLACEMENT;  Surgeon: Rachael Fee, MD;  Location: [facility] ENDOSCOPY;  Service: Endoscopy;;   BILIARY STENT PLACEMENT N/A 10/29/2023   Procedure: BILIARY STENT PLACEMENT;  Surgeon: Lemar Lofty., MD;  Location: [facility]OSCOPY;  Service: Gastroenterology;  Laterality: N/A;   BIOPSY  03/05/2021   Procedure: BIOPSY;  Surgeon: Lemar Lofty., MD;  Location: [facility]OSCOPY;  Service: Gastroenterology;;   BIOPSY  11/27/2021   Procedure: BIOPSY;  Surgeon:  Rachael Fee, MD;  Location: [facility] ENDOSCOPY;  Service: Endoscopy;;   BIOPSY  10/29/2023   Procedure: BIOPSY;  Surgeon: Lemar Lofty., MD;  Location: [facility]OSCOPY;  Service: Gastroenterology;;   CHOLECYSTECTOMY     ENDOSCOPIC RETROGRADE CHOLANGIOPANCREATOGRAPHY (ERCP) WITH PROPOFOL N/A 03/05/2021   Procedure: ENDOSCOPIC RETROGRADE CHOLANGIOPANCREATOGRAPHY (ERCP) WITH PROPOFOL;  Surgeon: Lemar Lofty., MD;  Location: [facility] ENDOSCOPY;  Service: Gastroenterology;  Laterality: N/A;   ENDOSCOPIC RETROGRADE CHOLANGIOPANCREATOGRAPHY (ERCP) WITH PROPOFOL N/A 11/27/2021   Procedure: ENDOSCOPIC RETROGRADE CHOLANGIOPANCREATOGRAPHY (ERCP) WITH PROPOFOL;  Surgeon: Rachael Fee, MD;  Location: [facility] ENDOSCOPY;  Service: Endoscopy;  Laterality: N/A;  possible EGD prior   ENDOSCOPIC RETROGRADE CHOLANGIOPANCREATOGRAPHY (ERCP) WITH PROPOFOL N/A 02/11/2022   Procedure: ENDOSCOPIC RETROGRADE CHOLANGIOPANCREATOGRAPHY (ERCP) WITH PROPOFOL;  Surgeon: Meridee Score Netty Starring., MD;  Location: [facility] ENDOSCOPY;  Service: Gastroenterology;  Laterality: N/A;   ERCP N/A 10/29/2023   Procedure: ENDOSCOPIC RETROGRADE CHOLANGIOPANCREATOGRAPHY (ERCP);  Surgeon: Lemar Lofty., MD;  Location: [facility] ENDOSCOPY;  Service: Gastroenterology;  Laterality: N/A;   ESOPHAGOGASTRODUODENOSCOPY N/A 11/27/2021   Procedure: ESOPHAGOGASTRODUODENOSCOPY (EGD);  Surgeon: Rachael Fee, MD;  Location: [facility] ENDOSCOPY;  Service: Endoscopy;  Laterality: N/A;   ESOPHAGOGASTRODUODENOSCOPY N/A 10/29/2023   Procedure: ESOPHAGOGASTRODUODENOSCOPY (EGD);  Surgeon: Lemar Lofty., MD;  Location: [facility] ENDOSCOPY;  Service: Gastroenterology;  Laterality: N/A;   ESOPHAGOGASTRODUODENOSCOPY (EGD) WITH PROPOFOL N/A 03/05/2021   Procedure: ESOPHAGOGASTRODUODENOSCOPY (EGD) WITH PROPOFOL;  Surgeon: Meridee Score Netty Starring., MD;  Location: [facility] ENDOSCOPY;  Service: Gastroenterology;  Laterality: N/A;   EUS N/A 03/05/2021   Procedure: UPPER  ENDOSCOPIC ULTRASOUND (EUS) RADIAL;  Surgeon: Meridee Score Netty Starring., MD;  Location: [facility] ENDOSCOPY;  Service: Gastroenterology;  Laterality: N/A;   LITHOTRIPSY  02/11/2022   Procedure: LITHOTRIPSY;  Surgeon: Meridee Score Netty Starring., MD;  Location: [facility] ENDOSCOPY;  Service: Gastroenterology;;   LITHOTRIPSY  10/29/2023   Procedure: LITHOTRIPSY;  Surgeon: Meridee Score Netty Starring., MD;  Location: [facility] ENDOSCOPY;  Service: Gastroenterology;;   LUMBAR LAMINECTOMY     REMOVAL OF STONES  03/05/2021   Procedure: REMOVAL OF STONES;  Surgeon: Lemar Lofty., MD;  Location: [facility] ENDOSCOPY;  Service: Gastroenterology;;   REMOVAL OF STONES  11/27/2021   Procedure: REMOVAL OF STONES;  Surgeon: Rachael Fee, MD;  Location: [facility] ENDOSCOPY;  Service: Endoscopy;;   REMOVAL OF STONES  02/11/2022   Procedure: REMOVAL OF STONES;  Surgeon: Lemar Lofty., MD;  Location: [facility] ENDOSCOPY;  Service: Gastroenterology;;   REMOVAL OF STONES  10/29/2023   Procedure: REMOVAL OF STONES;  Surgeon: Lemar Lofty., MD;  Location: [facility] ENDOSCOPY;  Service: Gastroenterology;;   SPINAL CORD STIMULATOR IMPLANT     Not active any more.   SPYGLASS CHOLANGIOSCOPY N/A 03/05/2021   Procedure: SPYGLASS CHOLANGIOSCOPY;  Surgeon: Lemar Lofty., MD;  Location: [facility] ENDOSCOPY;  Service: Gastroenterology;  Laterality: N/A;   SPYGLASS CHOLANGIOSCOPY N/A 02/11/2022   Procedure: SPYGLASS CHOLANGIOSCOPY;  Surgeon: Lemar Lofty., MD;  Location: [facility] ENDOSCOPY;  Service: Gastroenterology;  Laterality: N/A;   SPYGLASS LITHOTRIPSY N/A 03/05/2021   Procedure: [place]  LITHOTRIPSY;  Surgeon: Mansouraty, Netty Starring., MD;  Location: [facility] ENDOSCOPY;  Service: Gastroenterology;  Laterality: N/A;   SPYGLASS LITHOTRIPSY N/A 02/11/2022   Procedure: ZOXWRUEA LITHOTRIPSY;  Surgeon: Lemar Lofty., MD;  Location: [facility] ENDOSCOPY;  Service: Gastroenterology;  Laterality: N/A;   STENT REMOVAL  02/11/2022   Procedure: STENT  REMOVAL;  Surgeon: Meridee Score Netty Starring., MD;  Location: [facility] ENDOSCOPY;  Service: Gastroenterology;;   TONSILLECTOMY     TOTAL KNEE ARTHROPLASTY Right     Social History:  reports that she quit smoking about 35 years ago. Her smoking use included cigarettes. She started smoking about 70 years ago. She has a 35 pack-year smoking history. She has never used smokeless tobacco. She reports that she does not currently use alcohol. She reports that she does not use drugs.  Allergies:  Allergies  Allergen Reactions   Gramicidin Other (See Comments)    Gramicidin is an antibiotic peptide that is used to treat local infections caused by gram-positive bacteria. Unknown reaction   Sulfa Antibiotics Rash   Wound Dressing Adhesive Rash and Other (See Comments)    Some dressings break out the skin    (Not in a hospital admission)    Physical Exam: Blood pressure (!) 106/56, pulse (!) 136, temperature 97.8 F (36.6 C), resp. rate (!) 29, SpO2 94%. General: resting in bed, appears frail Neurological: alert HEENT: normocephalic, atraumatic, NG in place draining thick brown fluid CV: mild tachycardia 100s, regular Respiratory: normal work of breathing, symmetric chest wall expansion Abdomen: distended but compressible, tender to palpation, well-healed surgical scars Extremities: warm and well-perfused, no deformities Psychiatric: normal mood and affect Skin: warm and dry, no jaundice, no rashes or lesions   Results for orders placed or performed during the hospital encounter of 03/08/24 (from the past 48 hours)  Comprehensive metabolic panel     Status: Abnormal   Collection Time: 03/08/24  1:55 PM  Result Value Ref Range   Sodium 131 (L) 135 - 145 mmol/L    Comment: ELECTROLYTES REPEATED TO VERIFY   Potassium 3.9 3.5 - 5.1 mmol/L   Chloride 93 (L) 98 - 111 mmol/L    Comment: ELECTROLYTES REPEATED TO VERIFY   CO2 16 (L) 22 - 32 mmol/L    Comment: ELECTROLYTES REPEATED TO VERIFY    Glucose, Bld 228 (H) 70 - 99 mg/dL    Comment: Glucose reference range applies only to samples taken after fasting for at least 8 hours.   BUN 36 (H) 8 - 23 mg/dL   Creatinine, Ser 5.40 (H) 0.44 - 1.00 mg/dL   Calcium 9.3 8.9 - 98.1 mg/dL   Total Protein 5.5 (L) 6.5 - 8.1 g/dL   Albumin 2.0 (L) 3.5 - 5.0 g/dL   AST 191 (H) 15 - 41 U/L    Comment: RESULT CONFIRMED BY MANUAL DILUTION   ALT 115 (H) 0 - 44 U/L    Comment: RESULT CONFIRMED BY MANUAL DILUTION   Alkaline Phosphatase 123 38 - 126 U/L   Total Bilirubin 0.6 0.0 - 1.2 mg/dL   GFR, Estimated 49 (L) >60 mL/min    Comment: (NOTE) Calculated using the CKD-EPI Creatinine Equation (2021)    Anion gap 22 (H) 5 - 15    Comment: ELECTROLYTES REPEATED TO VERIFY Performed at [facility], [address], [place], [place] [address]   CBC with Differential     Status: Abnormal   Collection Time: 03/08/24  1:55 PM  Result Value Ref Range   WBC 40.4 (  H) 4.0 - 10.5 K/uL   RBC 4.02 3.87 - 5.11 MIL/uL   Hemoglobin 10.5 (L) 12.0 - 15.0 g/dL   HCT 16.1 (L) 09.6 - 04.5 %   MCV 84.1 80.0 - 100.0 fL   MCH 26.1 26.0 - 34.0 pg   MCHC 31.1 30.0 - 36.0 g/dL   RDW 40.9 (H) 81.1 - 91.4 %   Platelets 385 150 - 400 K/uL   nRBC 0.0 0.0 - 0.2 %   Neutrophils Relative % 94 %   Neutro Abs 37.5 (H) 1.7 - 7.7 K/uL   Lymphocytes Relative 1 %   Lymphs Abs 0.5 (L) 0.7 - 4.0 K/uL   Monocytes Relative 3 %   Monocytes Absolute 1.4 (H) 0.1 - 1.0 K/uL   Eosinophils Relative 0 %   Eosinophils Absolute 0.0 0.0 - 0.5 K/uL   Basophils Relative 0 %   Basophils Absolute 0.0 0.0 - 0.1 K/uL   WBC Morphology VACUOLATED NEUTROPHILS    Immature Granulocytes 2 %   Abs Immature Granulocytes 0.92 (H) 0.00 - 0.07 K/uL   Burr Cells PRESENT     Comment: Performed at [facility], [address] [place], [place] [address]  Protime-INR     Status: Abnormal   Collection Time: 03/08/24  1:55 PM  Result Value Ref Range   Prothrombin  Time >90.0 (H) 11.4 - 15.2 seconds   INR >10.0 (HH) 0.8 - 1.2    Comment: CRITICAL RESULT CALLED TO, READ BACK BY AND VERIFIED WITH: I.FOLEY,RN ON 03/08/2024 AT 1513 BY SL (NOTE) INR goal varies based on device and disease states. Performed at [facility], [address] [place], [place] [address] CORRECTED ON 03/24 AT 1515: PREVIOUSLY REPORTED AS >10.0 CRITICAL RESULT CALLED TO, READ BACK BY AND VERIFIED WITH: I.FOLEY,RN ON 03/08/2024 AT 1513 BY SL   Lipase, blood     Status: None   Collection Time: 03/08/24  1:55 PM  Result Value Ref Range   Lipase 26 11 - 51 U/L    Comment: Performed at [facility], [address] [place], [place] [address]  I-Stat Lactic Acid, ED     Status: Abnormal   Collection Time: 03/08/24  2:02 PM  Result Value Ref Range   Lactic Acid, Venous 13.2 (HH) 0.5 - 1.9 mmol/L   Comment NOTIFIED PHYSICIAN   I-stat chem 8, ED (not at [facility], [facility] or [facility])     Status: Abnormal   Collection Time: 03/08/24  2:02 PM  Result Value Ref Range   Sodium 127 (L) 135 - 145 mmol/L   Potassium 3.9 3.5 - 5.1 mmol/L   Chloride 94 (L) 98 - 111 mmol/L   BUN 32 (H) 8 - 23 mg/dL   Creatinine, Ser 4.69 0.44 - 1.00 mg/dL   Glucose, Bld 629 (H) 70 - 99 mg/dL    Comment: Glucose reference range applies only to samples taken after fasting for at least 8 hours.   Calcium, Ion 1.15 1.15 - 1.40 mmol/L   TCO2 16 (L) 22 - 32 mmol/L   Hemoglobin 11.6 (L) 12.0 - 15.0 g/dL   HCT 52.8 (L) 41.3 - 24.4 %  I-Stat Lactic Acid, ED     Status: Abnormal   Collection Time: 03/08/24  4:07 PM  Result Value Ref Range   Lactic Acid, Venous 12.5 (HH) 0.5 - 1.9 mmol/L   Comment NOTIFIED PHYSICIAN   Protime-INR     Status: Abnormal   Collection Time: 03/08/24  4:09  PM  Result Value Ref Range   Prothrombin Time >90.0 (H) 11.4 - 15.2 seconds   INR >10.0 (HH) 0.8 - 1.2    Comment: CRITICAL RESULT CALLED TO, READ BACK BY AND VERIFIED WITH: B.SHELTON,RN ON 03/08/2024  AT 1734 BY SL (NOTE) INR goal varies based on device and disease states. Performed at [facility], [address] [place], [place] [address]    DG Abdomen 1 View Result Date: 03/08/2024 CLINICAL DATA:  Nasogastric tube placement EXAM: ABDOMEN - 1 VIEW COMPARISON:  CT abdomen 02/09/2024 FINDINGS: Nasogastric tube side port in the stomach body, tip in the stomach fundus. The tube is satisfactorily positioned. Dorsal column stimulator noted. Subsegmental atelectasis at the left lung base. Dextroconvex lumbar scoliosis.  Polymer biliary stents noted. Contrast medium in the renal collecting systems. Dilated upper abdominal bowel. The stomach is less dilated than it was on the CT scan from 03/08/2024 2:41 p.m. IMPRESSION: 1. Nasogastric tube side port in the stomach body, tip in the stomach fundus. The tube is satisfactorily positioned. 2. Dilated upper abdominal bowel. The stomach is less dilated than it was on the CT scan from 03/08/2024 2:41 PM. 3. Subsegmental atelectasis at the left lung base. Electronically Signed   By: Gaylyn Rong M.D.   On: 03/08/2024 18:02   DG Chest 2 View Result Date: 03/08/2024 CLINICAL DATA:  Nausea and vomiting, abdominal pain and distension EXAM: CHEST - 2 VIEW COMPARISON:  02/15/2024 and CT abdomen overlapping portions from 03/08/2024 FINDINGS: The patient is rotated to the right on today's radiograph, reducing diagnostic sensitivity and specificity. Atherosclerotic calcification of the aortic arch. Mild atelectasis or scarring at the left lung base. Heart size within normal limits. Dorsal column stimulator leads over the lower thoracic spine. Otherwise unremarkable. IMPRESSION: 1. Mild atelectasis or scarring at the left lung base. 2. Aortic Atherosclerosis (ICD10-I70.0). Electronically Signed   By: Gaylyn Rong M.D.   On: 03/08/2024 18:00   CT ABDOMEN PELVIS W CONTRAST Result Date: 03/08/2024 CLINICAL DATA:  Nausea and vomiting yesterday.  Abdominal pain and distension. 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 * Tracking Code: BO * EXAM: CT ABDOMEN AND PELVIS WITH CONTRAST TECHNIQUE: Multidetector CT imaging of the abdomen and pelvis was performed using the standard protocol following bolus administration of intravenous contrast. RADIATION DOSE REDUCTION: This exam was performed according to the departmental dose-optimization program which includes automated exposure control, adjustment of the mA and/or kV according to patient size and/or use of iterative reconstruction technique. CONTRAST:  OMNIPAQUE IOHEXOL 300 MG/ML  SOLN COMPARISON:  02/14/2024 FINDINGS: Lower chest: Mildly dilated fluid-filled esophagus extending to the distended stomach. Coronary atherosclerosis. Atelectasis or scarring in both lower lobes. Hepatobiliary: Pneumobilia especially affecting the left hepatic lobe as on previous exam. Mild hypoenhancement in the left hepatic lobe on portal venous phase images. Dilated common bile duct with two polymer biliary stents noted extending from the proximal CBD into the duodenum. Cholecystectomy. Indistinct/infiltrative hypodensity along the anterior margin of the right hepatic lobe on 33 of series 2, significance uncertain. Narrow left portal vein and tributaries without visualized occlusion or thrombosis. Pancreas: Pancreatic atrophy with speckled calcifications along the atrophic parenchyma compatible chronic calcific pancreatitis. Substantially dilated dorsal pancreatic duct especially in the pancreatic head where there continues to be a substantial cystic lesion difficult to differentiate from the adjacent duodenal lumen but measuring about 2.5 by 3.4 cm on image 36 series 2, quite possibly intraductal papillary mucinous neoplasm. Spleen: The spleen appears diffusely  hypodense even on delayed images. The splenic vein appears patent at the junction with the SMV but is indistinct distally in the  vicinity of the spleen, and possibly occluded or nearly occluded. Adrenals/Urinary Tract: Distended urinary bladder. No hydronephrosis or hydroureter. Adrenal glands unremarkable. Mild bilateral renal atrophy Stomach/Bowel: Diffusely distended stomach, small bowel, large bowel. Frothy fluid in the descending colon with some potential formed stool in the rectosigmoid. I do not see significant segments of nondilated bowel, accordingly the appearance favors a diffuse bowel ileus over obstruction. Sigmoid colon diverticulosis is present. I do not see definite pneumatosis. Vascular/Lymphatic: Atherosclerosis is present, including aortoiliac atherosclerotic disease. Severe atheromatous plaque at the origin of the celiac trunk, occlusion not excluded. Severe atheromatous proximally in the superior mesenteric artery, compatible with at least severe stenosis if not occlusion with reconstitution. Today's exam was not performed as a CT angiogram. Dense atheromatous plaque proximally in both renal arteries. The inferior mesenteric artery appears to opacify and accordingly is thought to likely be patent. Reproductive: Uterus absent. Other: No ascites. Mild widespread subcutaneous edema. Right abdominal pain pump and/or dorsal column stimulator noted. Musculoskeletal: Left total hip prosthesis. Lumbar spondylosis and degenerative disc disease with postoperative findings in the lower lumbar spine and grade 1 anterolisthesis at L3-4 and L4-5. There is bilateral foraminal impingement at L3-4. IMPRESSION: 1. Diffusely distended stomach, small bowel, and large bowel, with frothy fluid in the descending colon and some potential formed stool in the rectosigmoid. I do not see significant segments of nondilated bowel, accordingly the appearance favors a diffuse bowel ileus over obstruction. 2. Severe atheromatous plaque at the origin of the celiac trunk, occlusion not excluded. Severe atheromatous plaque proximally in the superior  mesenteric artery, compatible with at least severe stenosis if not occlusion with reconstitution. Today's exam was not performed as a CT angiogram. Consider vascular referral for workup for chronic mesenteric ischemia, especially if the patient has had recent weight loss and symptoms such as postprandial abdominal pain. 3. The spleen appears diffusely hypodense even on delayed images. The splenic vein appears patent at the junction with the SMV but is indistinct distally in the vicinity of the spleen, and possibly occluded or nearly occluded. The splenic vein is probably occluded or nearly occluded. Cannot exclude incipient splenic infarction. 4. Indistinct/infiltrative hypodensity along the anterior margin of the right hepatic lobe, significance uncertain. This could be from steatosis or conceivably proper hepatic arterial vascular insufficiency. 5. Pneumobilia especially affecting the left hepatic lobe as on previous exam. Dilated common bile duct with two polymer biliary stents noted extending from the proximal CBD into the duodenum. 6. Chronic calcific pancreatitis. 7. Substantially dilated dorsal pancreatic duct especially in the pancreatic head where there continues to be a substantial cystic lesion difficult to differentiate from the adjacent duodenal lumen but measuring about 2.5 by 3.4 cm, quite possibly intraductal papillary mucinous neoplasm. 8. Distended urinary bladder. 9. Mild bilateral renal atrophy. 10. Coronary atherosclerosis. 11. Lumbar spondylosis and degenerative disc disease with postoperative findings in the lower lumbar spine and grade 1 anterolisthesis at L3-4 and L4-5. Bilateral foraminal impingement at L3-4. Electronically Signed   By: Gaylyn Rong M.D.   On: 03/08/2024 16:13      Assessment/Plan 89 yo female with a history of chronic constipation and recent admission with ileus, presenting with recurrent nausea/vomiting and abdominal distension. She has a lactic acidosis and  significantly elevated INR of 10. Her CT scan shows expected pneumobilia from her biliary stents, but no portal venous gas, pneumatosis  or intraabdominal free fluid. There is diffuse small bowel dilation and gastric distension, but no distinct transition point to suggest a mechanical obstruction. In light of her chronic constipation, this seems more consistent with diffuse dysmotility or ileus and not a true obstruction.  - 14-Fr NG placed in ED, I have requested this to be upsized to an 18-Fr for better decompression. - I do not see any definite signs of small bowel ischemia on imaging.  - Continue fluid resuscitation - Patient is a high-risk surgical candidate especially with her supratherapeutic INR. I brought up with her daughter whether or not she would want surgery in the event of a persistent obstruction, or the development of ischemia or perforation. Her daughter is a Engineer, civil (consulting) and understands the risks of surgical intervention, and is not sure her mother would want this. She is currently a DNR. She does state that prior to her last admission, the patient was relatively independent. Will need to have ongoing goals of care discussions. - Surgery will follow   Sophronia Simas, MD [facility] Surgery General, Hepatobiliary and Pancreatic Surgery 03/08/24 6:20 PM

## 2024-03-08 NOTE — H&P (View-Only) (Signed)
 NAME:  Sherry Holloway, MRN:  [id number], DOB:  [birth year], LOS: 0 ADMISSION DATE:  03/08/2024, CONSULTATION DATE:  03/08/2024  REFERRING MD:  EDP, CHIEF COMPLAINT:  abdominal pain/distension   History of Present Illness:   89 year old woman, resident of [place] [facility] presents with nausea and vomiting for 1 day and abdominal distention and severe abdominal pain.  History is obtained after speaking to her daughter who is a Engineer, civil (consulting) at [facility] and provides a detailed history. She was recently admitted for several weeks for small bowel obstruction and required several NG tube placement and decompression.  She was managed conservatively however even after return to the rehab facility, she was not able to take much orally and has had a weight loss of 15 to 20 pounds. Initial labs significant for INR more than 10, severe leukocytosis 40k , with left shift , lactate of 13. She was given IV fluids with decreasing her heart rate from 150s to low 100s. CT abdomen/pelvis shows small bowel obstruction without a definite transition point, no evidence of that bowel or portal venous gas She was seen by surgery in consultation, PCCM asked to admit  Pertinent  Medical History  Hypertension Esophageal cancer Biliary stones status post stent placement 10/2023 no bleeding okay Chronic pain syndrome, on opiates Chronic calcified pancreatitis  Significant Hospital Events: Including procedures, antibiotic start and stop dates in addition to other pertinent events     Interim History / Subjective:  Complains of abdominal pain Just received Dilaudid, somnolent but awakes easily  Objective   Blood pressure (!) 106/56, pulse (!) 136, temperature 97.8 F (36.6 C), resp. rate (!) 29, SpO2 94%.       No intake or output data in the 24 hours ending 03/08/24 1822 There were no vitals filed for this visit.  Examination: General: Elderly woman appears frail and chronically ill HENT: NG tube, mild pallor,  no icterus, dry mucosa flat neck veins Lungs: Clear breath sounds bilateral, no accessory muscle use Cardiovascular: S1 and S2 tacky, sinus on monitor Abdomen: Distended, soft, diffuse tenderness, no guarding Extremities: No edema, no deformity Neuro: Somnolent but awakes easily, interactive, nonfocal   Labs show hyponatremia, BUN/creatinine 32/1.0, lactate 13, severe leukocytosis 40K with left shift, stable anemia, INR more than 10  Resolved Hospital Problem list     Assessment & Plan:  Acute abdomen Recurrent small bowel obstruction Severe lactic acidosis and leukocytosis increases concern for dead bowel  -NG tube decompression -Not a candidate for surgery, daughter states that she would not want surgery even if her condition were to deteriorate -Empiric Zosyn -IV fluids  Coagulopathy -related to Xarelto, and likely shock liver -Vitamin K and recheck Will hold off on FFP, since surgery not planned  Pain management -chronic opiate use Given goals of care, will use morphine as needed  Goals of care discussion as below , guarded prognosis given to daughter   Best Practice (right click and "Reselect all SmartList Selections" daily)   Diet/type: NPO DVT prophylaxis DOAC Pressure ulcer(s): N/A GI prophylaxis: N/A Lines: N/A Foley:  N/A Code Status:  DNR Last date of multidisciplinary goals of care discussion [discussed with daughter -no CPR, no intubation, no CVL, pressors PIV okay first 24 hours while we are unsure if he can reverse this process]  Labs   CBC: Recent Labs  Lab 03/08/24 1355 03/08/24 1402  WBC 40.4*  --   NEUTROABS 37.5*  --   HGB 10.5* 11.6*  HCT 33.8* 34.0*  MCV 84.1  --   PLT 385  --     Basic Metabolic Panel: Recent Labs  Lab 03/08/24 1355 03/08/24 1402  NA 131* 127*  K 3.9 3.9  CL 93* 94*  CO2 16*  --   GLUCOSE 228* 216*  BUN 36* 32*  CREATININE 1.05* 1.00  CALCIUM 9.3  --    GFR: CrCl cannot be calculated (Unknown ideal  weight.). Recent Labs  Lab 03/08/24 1355 03/08/24 1402 03/08/24 1607  WBC 40.4*  --   --   LATICACIDVEN  --  13.2* 12.5*    Liver Function Tests: Recent Labs  Lab 03/08/24 1355  AST 275*  ALT 115*  ALKPHOS 123  BILITOT 0.6  PROT 5.5*  ALBUMIN 2.0*   Recent Labs  Lab 03/08/24 1355  LIPASE 26   No results for input(s): "AMMONIA" in the last 168 hours.  ABG    Component Value Date/Time   TCO2 16 (L) 03/08/2024 1402     Coagulation Profile: Recent Labs  Lab 03/08/24 1355 03/08/24 1609  INR >10.0* >10.0*    Cardiac Enzymes: No results for input(s): "CKTOTAL", "CKMB", "CKMBINDEX", "TROPONINI" in the last 168 hours.  HbA1C: Hgb A1c MFr Bld  Date/Time Value Ref Range Status  02/14/2024 05:22 AM 7.7 (H) 4.8 - 5.6 % Final    Comment:    (NOTE) Pre diabetes:          5.7%-6.4%  Diabetes:              >6.4%  Glycemic control for   <7.0% adults with diabetes   11/07/2023 02:22 PM 7.3 (H) <5.7 % of total Hgb Final    Comment:    For someone without known diabetes, a hemoglobin A1c value of 6.5% or greater indicates that they may have  diabetes and this should be confirmed with a follow-up  test. . For someone with known diabetes, a value <7% indicates  that their diabetes is well controlled and a value  greater than or equal to 7% indicates suboptimal  control. A1c targets should be individualized based on  duration of diabetes, age, comorbid conditions, and  other considerations. . Currently, no consensus exists regarding use of hemoglobin A1c for diagnosis of diabetes for children. .     CBG: No results for input(s): "GLUCAP" in the last 168 hours.  Review of Systems:   Chronic back pain, narcotics Nausea/vomiting x 1 day Abdominal pain and distention x 1 day Weight loss 15 to 20 pounds  Past Medical History:  She,  has a past medical history of Arthritis, Choledocholithiasis, Diabetes mellitus without complication (HCC), Diverticulitis,  Esophageal cancer (HCC) (dx'd 2020), GERD (gastroesophageal reflux disease), Hyperlipidemia, Hypertension, OSA (obstructive sleep apnea), Urine incontinence, and Uterine cancer (HCC).   Surgical History:   Past Surgical History:  Procedure Laterality Date   ABDOMINAL HYSTERECTOMY  1972   APPENDECTOMY     BALLOON DILATION N/A 10/29/2023   Procedure: Billiary BALLOON DILATION;  Surgeon: Lemar Lofty., MD;  Location: [facility] ENDOSCOPY;  Service: Gastroenterology;  Laterality: N/A;   BILIARY DILATION  03/05/2021   Procedure: BILIARY DILATION;  Surgeon: Meridee Score Netty Starring., MD;  Location: [facility];  Service: Gastroenterology;;   BILIARY DILATION  11/27/2021   Procedure: BILIARY DILATION;  Surgeon: Rachael Fee, MD;  Location: [facility] ENDOSCOPY;  Service: Endoscopy;;   BILIARY DILATION  02/11/2022   Procedure: BILIARY DILATION;  Surgeon: Lemar Lofty., MD;  Location: [facility];  Service: Gastroenterology;;   BILIARY STENT  PLACEMENT  11/27/2021   Procedure: BILIARY STENT PLACEMENT;  Surgeon: Rachael Fee, MD;  Location: [facility] ENDOSCOPY;  Service: Endoscopy;;   BILIARY STENT PLACEMENT N/A 10/29/2023   Procedure: BILIARY STENT PLACEMENT;  Surgeon: Lemar Lofty., MD;  Location: [facility]COPY;  Service: Gastroenterology;  Laterality: N/A;   BIOPSY  03/05/2021   Procedure: BIOPSY;  Surgeon: Lemar Lofty., MD;  Location: [facility]COPY;  Service: Gastroenterology;;   BIOPSY  11/27/2021   Procedure: BIOPSY;  Surgeon: Rachael Fee, MD;  Location: [facility] ENDOSCOPY;  Service: Endoscopy;;   BIOPSY  10/29/2023   Procedure: BIOPSY;  Surgeon: Lemar Lofty., MD;  Location: [facility]OSCOPY;  Service: Gastroenterology;;   CHOLECYSTECTOMY     ENDOSCOPIC RETROGRADE CHOLANGIOPANCREATOGRAPHY (ERCP) WITH PROPOFOL N/A 03/05/2021   Procedure: ENDOSCOPIC RETROGRADE CHOLANGIOPANCREATOGRAPHY (ERCP) WITH PROPOFOL;  Surgeon: Lemar Lofty., MD;  Location: [facility]  ENDOSCOPY;  Service: Gastroenterology;  Laterality: N/A;   ENDOSCOPIC RETROGRADE CHOLANGIOPANCREATOGRAPHY (ERCP) WITH PROPOFOL N/A 11/27/2021   Procedure: ENDOSCOPIC RETROGRADE CHOLANGIOPANCREATOGRAPHY (ERCP) WITH PROPOFOL;  Surgeon: Rachael Fee, MD;  Location: [facility] ENDOSCOPY;  Service: Endoscopy;  Laterality: N/A;  possible EGD prior   ENDOSCOPIC RETROGRADE CHOLANGIOPANCREATOGRAPHY (ERCP) WITH PROPOFOL N/A 02/11/2022   Procedure: ENDOSCOPIC RETROGRADE CHOLANGIOPANCREATOGRAPHY (ERCP) WITH PROPOFOL;  Surgeon: Meridee Score Netty Starring., MD;  Location: [facility] ENDOSCOPY;  Service: Gastroenterology;  Laterality: N/A;   ERCP N/A 10/29/2023   Procedure: ENDOSCOPIC RETROGRADE CHOLANGIOPANCREATOGRAPHY (ERCP);  Surgeon: Lemar Lofty., MD;  Location: [facility] END[facility]COPY;  Service: Gastroenterology;  Laterality: N/A;   ESOPHAGOGASTRODUODENOSCOPY N/A 11/27/2021   Procedure: ESOPHAGOGASTRODUODENOSCOPY (EGD);  Surgeon: Rachael Fee, MD;  Location: [facility] ENDOSCOPY;  Service: Endoscopy;  Laterality: N/A;   ESOPHAGOGASTRODUODENOSCOPY N/A 10/29/2023   Procedure: ESOPHAGOGASTRODUODENOSCOPY (EGD);  Surgeon: Lemar Lofty., MD;  Location: [facility] ENDOSCOPY;  Service: Gastroenterology;  Laterality: N/A;   ESOPHAGOGASTRODUODENOSCOPY (EGD) WITH PROPOFOL N/A 03/05/2021   Procedure: ESOPHAGOGASTRODUODENOSCOPY (EGD) WITH PROPOFOL;  Surgeon: Meridee Score Netty Starring., MD;  Location: [facility] ENDOSCOPY;  Service: Gastroenterology;  Laterality: N/A;   EUS N/A 03/05/2021   Procedure: UPPER ENDOSCOPIC ULTRASOUND (EUS) RADIAL;  Surgeon: Meridee Score Netty Starring., MD;  Location: [facility] ENDOS[facility];  Service: Gastroenterology;  Laterality: N/A;   LITHOTRIPSY  02/11/2022   Procedure: LITHOTRIPSY;  Surgeon: Meridee Score Netty Starring., MD;  Location: [facility]COPY;  Service: Gastroenterology;;   LITHOTRIPSY  10/29/2023   Procedure: LITHOTRIPSY;  Surgeon: Meridee Score Netty Starring., MD;  Location: [facility]COPY;  Service: Gastroenterology;;   LUMBAR  LAMINECTOMY     REMOVAL OF STONES  03/05/2021   Procedure: REMOVAL OF STONES;  Surgeon: Lemar Lofty., MD;  Location: [facility];  Service: Gastroenterology;;   REMOVAL OF STONES  11/27/2021   Procedure: REMOVAL OF STONES;  Surgeon: Rachael Fee, MD;  Location[facility]COPY;  Service: Endoscopy;;   REMOVAL OF STONES  02/11/2022   Procedure: REMOVAL OF STONES;  Surgeon: Lemar Lofty., MD;  Location: [facility];  Service: Gastroenterology;;   REMOVAL OF STONES  10/29/2023   Procedure: REMOVAL OF STONES;  Surgeon: Lemar Lofty., MD;  Location: [facility]COPY;  Service: Gastroenterology;;   SPINAL CORD STIMULATOR IMPLANT     Not active any more.   SPYGLASS CHOLANGIOSCOPY N/A 03/05/2021   Procedure: SPYGLASS CHOLANGIOSCOPY;  Surgeon: Lemar Lofty., MD;  Location: [facility];  Service: Gastroenterology;  Laterality: N/A;   SPYGLASS CHOLANGIOSCOPY N/A 02/11/2022   Procedure: SPYGLASS CHOLANGIOSCOPY;  Surgeon: Lemar Lofty., MD;  Location: [facility] END[facility];  Service: Gastroenterology;  Laterality: N/A;   SPYGLASS LITHOTRIPSY N/A  03/05/2021   Procedure: [id number] LITHOTRIPSY;  Surgeon: Lemar Lofty., MD;  Location: [facility];  Service: Gastroenterology;  Laterality: N/A;   SPYGLASS LITHOTRIPSY N/A 02/11/2022   Procedure: [id number] LITHOTRIPSY;  Surgeon: Lemar Lofty., MD;  Location: [facility];  Service: Gastroenterology;  Laterality: N/A;   STENT REMOVAL  02/11/2022   Procedure: STENT REMOVAL;  Surgeon: Meridee Score Netty Starring., MD;  Location: [facility];  Service: Gastroenterology;;   TONSILLECTOMY     TOTAL KNEE ARTHROPLASTY Right      Social History:   reports that she quit smoking about 35 years ago. Her smoking use included cigarettes. She started smoking about 70 years ago. She has a 35 pack-year smoking history. She has never used smokeless tobacco. She reports that she does not currently use alcohol. She reports  that she does not use drugs.   Family History:  Her family history includes Diabetes in her daughter, mother, and son; Heart disease in her father; Hypertension in her daughter, daughter, son, and son; Lung cancer in her sister and son. There is no history of Colon cancer, Esophageal cancer, Inflammatory bowel disease, Liver disease, Pancreatic cancer, Rectal cancer, or Stomach cancer.   Allergies Allergies  Allergen Reactions   Gramicidin Other (See Comments)    Gramicidin is an antibiotic peptide that is used to treat local infections caused by gram-positive bacteria. Unknown reaction   Sulfa Antibiotics Rash   Wound Dressing Adhesive Rash and Other (See Comments)    Some dressings break out the skin     Home Medications  Prior to Admission medications   Medication Sig Start Date End Date Taking? Authorizing Provider  bisacodyl (DULCOLAX) 10 MG suppository Place 10 mg rectally daily as needed for moderate constipation.   Yes [provider]  CREON 36000-114000 units CPEP capsule TAKE 1 CAPSULE THREE TIMES A DAY BEFORE MEALS Patient taking differently: Take 36,000 Units by mouth See admin instructions. Take 36,000 units by mouth before breakfast and supper/evening meal 05/05/23  Yes Lowne Florina Ou R, DO  docusate sodium (COLACE) 100 MG capsule Take 1 capsule (100 mg total) by mouth 2 (two) times daily. Patient taking differently: Take 100-200 mg by mouth See admin instructions. Take 200 mg by mouth in the morning and 100 mg in the evening 03/18/21  Yes Osvaldo Shipper, MD  esomeprazole (NEXIUM) 40 MG capsule Take 40 mg by mouth in the morning and at bedtime.   Yes [provider]  famciclovir (FAMVIR) 125 MG tablet TAKE 1 TABLET DAILY 03/31/23  Yes Zola Button, Yvonne R, DO  melatonin 5 MG TABS Take 5 mg by mouth at bedtime.   Yes [provider]  Menthol, Topical Analgesic, (BIOFREEZE COOL THE PAIN) 4 % GEL Apply 1 application  topically every 6 (six) hours as  needed (pain).   Yes [provider]  metroNIDAZOLE (FLAGYL) 500 MG tablet Take 500 mg by mouth 2 (two) times daily.   Yes [provider]  mirtazapine (REMERON) 15 MG tablet TAKE 1 TABLET AT BEDTIME 04/16/23  Yes Seabron Spates R, DO  morphine (MS CONTIN) 30 MG 12 hr tablet Take 30 mg by mouth in the morning and at bedtime.   Yes [provider]  Multiple Vitamin (MULTIVITAMIN WITH MINERALS) TABS tablet Take 1 tablet by mouth daily with supper.   Yes [provider]  predniSONE (DELTASONE) 10 MG tablet Take 10 mg by mouth daily.   Yes [provider]  senna-docusate (SENNA S) 8.6-50 MG tablet  Take 1 tablet by mouth See admin instructions. Take 1 tablet by mouth in the morning and evening   Yes [provider]  sucralfate (CARAFATE) 1 GM/10ML suspension Take 10 mLs by mouth 2 (two) times daily.   Yes [provider]  TYLENOL 500 MG tablet Take 1,000 mg by mouth every 6 (six) hours as needed for mild pain (pain score 1-3) or headache.   Yes [provider]  XARELTO 20 MG TABS tablet TAKE 1 TABLET DAILY WITH SUPPER, START ONLY AFTER THE STARTER PACK HAS BEEN COMPLETED Patient taking differently: Take 20 mg by mouth daily with supper. 10/23/23  Yes Seabron Spates R, DO  bumetanide (BUMEX) 1 MG tablet Take 0.5-1 tablets (0.5-1 mg total) by mouth daily as needed (swelling). 10/31/23   Rolly Salter, MD  feeding supplement (ENSURE ENLIVE / ENSURE PLUS) LIQD Take 237 mLs by mouth 2 (two) times daily between meals. Patient not taking: Reported on 03/08/2024 02/20/24   Alberteen Sam, MD  glucose blood (FREESTYLE LITE) test strip CHECK BLOOD SUGAR TWICE A DAY 11/20/22   Zola Button, Grayling Congress, DO  JARDIANCE 25 MG TABS tablet TAKE 1 TABLET DAILY BEFORE BREAKFAST 01/30/24   Zola Button, Grayling Congress, DO  latanoprost (XALATAN) 0.005 % ophthalmic solution Place 1 drop into both eyes at bedtime. Patient not taking: Reported on  03/08/2024 08/24/21   Zola Button, Grayling Congress, DO  LINZESS 145 MCG CAPS capsule TAKE 1 CAPSULE DAILY BEFORE BREAKFAST Patient not taking: Reported on 03/08/2024 01/08/24   Donato Schultz, DO  morphine (MS CONTIN) 15 MG 12 hr tablet Take 1 tablet (15 mg total) by mouth every 12 (twelve) hours. Patient not taking: Reported on 03/08/2024 02/20/24   Alberteen Sam, MD  omeprazole (PRILOSEC) 40 MG capsule TAKE 1 CAPSULE IN THE MORNING AND AT BEDTIME Patient not taking: Reported on 03/08/2024 01/20/24   Mansouraty, Netty Starring., MD     Critical care time: 77 m      Cyril Mourning MD. [facility]. [place] [facility] Pager : [phone]  If no response to pager , please call [phone] until 7 pm After 7:00 pm call Elink  [phone]   03/08/2024

## 2024-03-08 NOTE — Sepsis Progress Note (Signed)
 Notified provider of need to consider ordering more fluids and repeat lactic acid. First LA 13.2 and second LA 12.5.

## 2024-03-08 NOTE — Consult Note (Addendum)
 NAME:  Sherry Holloway, MRN:  [id number], DOB:  [birth year], LOS: 0 ADMISSION DATE:  03/08/2024, CONSULTATION DATE:  03/08/2024  REFERRING MD:  EDP, CHIEF COMPLAINT:  abdominal pain/distension   History of Present Illness:   89 year old woman, resident of [place] [facility] presents with nausea and vomiting for 1 day and abdominal distention and severe abdominal pain.  History is obtained after speaking to her daughter who is a Engineer, civil (consulting) at [facility] and provides a detailed history. She was recently admitted for several weeks for small bowel obstruction and required several NG tube placement and decompression.  She was managed conservatively however even after return to the rehab facility, she was not able to take much orally and has had a weight loss of 15 to 20 pounds. Initial labs significant for INR more than 10, severe leukocytosis 40k , with left shift , lactate of 13. She was given IV fluids with decreasing her heart rate from 150s to low 100s. CT abdomen/pelvis shows small bowel obstruction without a definite transition point, no evidence of that bowel or portal venous gas She was seen by surgery in consultation, PCCM asked to admit  Pertinent  Medical History  Hypertension Esophageal cancer Biliary stones status post stent placement 10/2023 no bleeding okay Chronic pain syndrome, on opiates Chronic calcified pancreatitis  Significant Hospital Events: Including procedures, antibiotic start and stop dates in addition to other pertinent events     Interim History / Subjective:  Complains of abdominal pain Just received Dilaudid, somnolent but awakes easily  Objective   Blood pressure (!) 106/56, pulse (!) 136, temperature 97.8 F (36.6 C), resp. rate (!) 29, SpO2 94%.       No intake or output data in the 24 hours ending 03/08/24 1822 There were no vitals filed for this visit.  Examination: General: Elderly woman appears frail and chronically ill HENT: NG tube, mild pallor,  no icterus, dry mucosa flat neck veins Lungs: Clear breath sounds bilateral, no accessory muscle use Cardiovascular: S1 and S2 tacky, sinus on monitor Abdomen: Distended, soft, diffuse tenderness, no guarding Extremities: No edema, no deformity Neuro: Somnolent but awakes easily, interactive, nonfocal   Labs show hyponatremia, BUN/creatinine 32/1.0, lactate 13, severe leukocytosis 40K with left shift, stable anemia, INR more than 10  Resolved Hospital Problem list     Assessment & Plan:  Acute abdomen Recurrent small bowel obstruction Severe lactic acidosis and leukocytosis increases concern for dead bowel  -NG tube decompression -Not a candidate for surgery, daughter states that she would not want surgery even if her condition were to deteriorate -Empiric Zosyn -IV fluids  Coagulopathy -related to Xarelto, and likely shock liver -Vitamin K and recheck Will hold off on FFP, since surgery not planned  Pain management -chronic opiate use Given goals of care, will use morphine as needed  Goals of care discussion as below , guarded prognosis given to daughter   Best Practice (right click and "Reselect all SmartList Selections" daily)   Diet/type: NPO DVT prophylaxis DOAC Pressure ulcer(s): N/A GI prophylaxis: N/A Lines: N/A Foley:  N/A Code Status:  DNR Last date of multidisciplinary goals of care discussion [discussed with daughter -no CPR, no intubation, no CVL, pressors PIV okay first 24 hours while we are unsure if he can reverse this process]  Labs   CBC: Recent Labs  Lab 03/08/24 1355 03/08/24 1402  WBC 40.4*  --   NEUTROABS 37.5*  --   HGB 10.5* 11.6*  HCT 33.8* 34.0*  MCV 84.1  --   PLT 385  --     Basic Metabolic Panel: Recent Labs  Lab 03/08/24 1355 03/08/24 1402  NA 131* 127*  K 3.9 3.9  CL 93* 94*  CO2 16*  --   GLUCOSE 228* 216*  BUN 36* 32*  CREATININE 1.05* 1.00  CALCIUM 9.3  --    GFR: CrCl cannot be calculated (Unknown ideal  weight.). Recent Labs  Lab 03/08/24 1355 03/08/24 1402 03/08/24 1607  WBC 40.4*  --   --   LATICACIDVEN  --  13.2* 12.5*    Liver Function Tests: Recent Labs  Lab 03/08/24 1355  AST 275*  ALT 115*  ALKPHOS 123  BILITOT 0.6  PROT 5.5*  ALBUMIN 2.0*   Recent Labs  Lab 03/08/24 1355  LIPASE 26   No results for input(s): "AMMONIA" in the last 168 hours.  ABG    Component Value Date/Time   TCO2 16 (L) 03/08/2024 1402     Coagulation Profile: Recent Labs  Lab 03/08/24 1355 03/08/24 1609  INR >10.0* >10.0*    Cardiac Enzymes: No results for input(s): "CKTOTAL", "CKMB", "CKMBINDEX", "TROPONINI" in the last 168 hours.  HbA1C: Hgb A1c MFr Bld  Date/Time Value Ref Range Status  02/14/2024 05:22 AM 7.7 (H) 4.8 - 5.6 % Final    Comment:    (NOTE) Pre diabetes:          5.7%-6.4%  Diabetes:              >6.4%  Glycemic control for   <7.0% adults with diabetes   11/07/2023 02:22 PM 7.3 (H) <5.7 % of total Hgb Final    Comment:    For someone without known diabetes, a hemoglobin A1c value of 6.5% or greater indicates that they may have  diabetes and this should be confirmed with a follow-up  test. . For someone with known diabetes, a value <7% indicates  that their diabetes is well controlled and a value  greater than or equal to 7% indicates suboptimal  control. A1c targets should be individualized based on  duration of diabetes, age, comorbid conditions, and  other considerations. . Currently, no consensus exists regarding use of hemoglobin A1c for diagnosis of diabetes for children. .     CBG: No results for input(s): "GLUCAP" in the last 168 hours.  Review of Systems:   Chronic back pain, narcotics Nausea/vomiting x 1 day Abdominal pain and distention x 1 day Weight loss 15 to 20 pounds  Past Medical History:  She,  has a past medical history of Arthritis, Choledocholithiasis, Diabetes mellitus without complication (HCC), Diverticulitis,  Esophageal cancer (HCC) (dx'd 2020), GERD (gastroesophageal reflux disease), Hyperlipidemia, Hypertension, OSA (obstructive sleep apnea), Urine incontinence, and Uterine cancer (HCC).   Surgical History:   Past Surgical History:  Procedure Laterality Date   ABDOMINAL HYSTERECTOMY  1972   APPENDECTOMY     BALLOON DILATION N/A 10/29/2023   Procedure: Billiary BALLOON DILATION;  Surgeon: Lemar Lofty., MD;  Location: [facility] ENDOSCOPY;  Service: Gastroenterology;  Laterality: N/A;   BILIARY DILATION  03/05/2021   Procedure: BILIARY DILATION;  Surgeon: Meridee Score Netty Starring., MD;  Location: [facility];  Service: Gastroenterology;;   BILIARY DILATION  11/27/2021   Procedure: BILIARY DILATION;  Surgeon: Rachael Fee, MD;  Location: [facility] ENDOSCOPY;  Service: Endoscopy;;   BILIARY DILATION  02/11/2022   Procedure: BILIARY DILATION;  Surgeon: Lemar Lofty., MD;  Location: [facility];  Service: Gastroenterology;;   BILIARY STENT  PLACEMENT  11/27/2021   Procedure: BILIARY STENT PLACEMENT;  Surgeon: Rachael Fee, MD;  Location: [facility] ENDOSCOPY;  Service: Endoscopy;;   BILIARY STENT PLACEMENT N/A 10/29/2023   Procedure: BILIARY STENT PLACEMENT;  Surgeon: Lemar Lofty., MD;  Location: [facility]COPY;  Service: Gastroenterology;  Laterality: N/A;   BIOPSY  03/05/2021   Procedure: BIOPSY;  Surgeon: Lemar Lofty., MD;  Location: [facility]OSCOPY;  Service: Gastroenterology;;   BIOPSY  11/27/2021   Procedure: BIOPSY;  Surgeon: Rachael Fee, MD;  Location: [facility] ENDOSCOPY;  Service: Endoscopy;;   BIOPSY  10/29/2023   Procedure: BIOPSY;  Surgeon: Lemar Lofty., MD;  Location: [facility]OSCOPY;  Service: Gastroenterology;;   CHOLECYSTECTOMY     ENDOSCOPIC RETROGRADE CHOLANGIOPANCREATOGRAPHY (ERCP) WITH PROPOFOL N/A 03/05/2021   Procedure: ENDOSCOPIC RETROGRADE CHOLANGIOPANCREATOGRAPHY (ERCP) WITH PROPOFOL;  Surgeon: Lemar Lofty., MD;  Location: [facility]  ENDOSCOPY;  Service: Gastroenterology;  Laterality: N/A;   ENDOSCOPIC RETROGRADE CHOLANGIOPANCREATOGRAPHY (ERCP) WITH PROPOFOL N/A 11/27/2021   Procedure: ENDOSCOPIC RETROGRADE CHOLANGIOPANCREATOGRAPHY (ERCP) WITH PROPOFOL;  Surgeon: Rachael Fee, MD;  Location: [facility] ENDOSCOPY;  Service: Endoscopy;  Laterality: N/A;  possible EGD prior   ENDOSCOPIC RETROGRADE CHOLANGIOPANCREATOGRAPHY (ERCP) WITH PROPOFOL N/A 02/11/2022   Procedure: ENDOSCOPIC RETROGRADE CHOLANGIOPANCREATOGRAPHY (ERCP) WITH PROPOFOL;  Surgeon: Meridee Score Netty Starring., MD;  Location: [facility] ENDOSCOPY;  Service: Gastroenterology;  Laterality: N/A;   ERCP N/A 10/29/2023   Procedure: ENDOSCOPIC RETROGRADE CHOLANGIOPANCREATOGRAPHY (ERCP);  Surgeon: Lemar Lofty., MD;  Location: [facility] ENDOSCOPY;  Service: Gastroenterology;  Laterality: N/A;   ESOPHAGOGASTRODUODENOSCOPY N/A 11/27/2021   Procedure: ESOPHAGOGASTRODUODENOSCOPY (EGD);  Surgeon: Rachael Fee, MD;  Location: [facility] ENDOSCOPY;  Service: Endoscopy;  Laterality: N/A;   ESOPHAGOGASTRODUODENOSCOPY N/A 10/29/2023   Procedure: ESOPHAGOGASTRODUODENOSCOPY (EGD);  Surgeon: Lemar Lofty., MD;  Location: [facility] ENDOSCOPY;  Service: Gastroenterology;  Laterality: N/A;   ESOPHAGOGASTRODUODENOSCOPY (EGD) WITH PROPOFOL N/A 03/05/2021   Procedure: ESOPHAGOGASTRODUODENOSCOPY (EGD) WITH PROPOFOL;  Surgeon: Meridee Score Netty Starring., MD;  Location: [facility] ENDOSCOPY;  Service: Gastroenterology;  Laterality: N/A;   EUS N/A 03/05/2021   Procedure: UPPER ENDOSCOPIC ULTRASOUND (EUS) RADIAL;  Surgeon: Meridee Score Netty Starring., MD;  Location: [facility] ENDOSCOPY;  Service: Gastroenterology;  Laterality: N/A;   LITHOTRIPSY  02/11/2022   Procedure: LITHOTRIPSY;  Surgeon: Meridee Score Netty Starring., MD;  Location: [facility] ENDOSCOPY;  Service: Gastroenterology;;   LITHOTRIPSY  10/29/2023   Procedure: LITHOTRIPSY;  Surgeon: Meridee Score Netty Starring., MD;  Location: [facility]OSCOPY;  Service: Gastroenterology;;   LUMBAR  LAMINECTOMY     REMOVAL OF STONES  03/05/2021   Procedure: REMOVAL OF STONES;  Surgeon: Lemar Lofty., MD;  Location: [facility] ENDOSCOPY;  Service: Gastroenterology;;   REMOVAL OF STONES  11/27/2021   Procedure: REMOVAL OF STONES;  Surgeon: Rachael Fee, MD;  Location[facility] END[facility]COPY;  Service: Endoscopy;;   REMOVAL OF STONES  02/11/2022   Procedure: REMOVAL OF STONES;  Surgeon: Lemar Lofty., MD;  Location: [facility]COPY;  Service: Gastroenterology;;   REMOVAL OF STONES  10/29/2023   Procedure: REMOVAL OF STONES;  Surgeon: Lemar Lofty., MD;  Location: [facility] ENDOSCOPY;  Service: Gastroenterology;;   SPINAL CORD STIMULATOR IMPLANT     Not active any more.   SPYGLASS CHOLANGIOSCOPY N/A 03/05/2021   Procedure: SPYGLASS CHOLANGIOSCOPY;  Surgeon: Lemar Lofty., MD;  Location: [facility];  Service: Gastroenterology;  Laterality: N/A;   SPYGLASS CHOLANGIOSCOPY N/A 02/11/2022   Procedure: SPYGLASS CHOLANGIOSCOPY;  Surgeon: Lemar Lofty., MD;  Location: [facility] END[facility];  Service: Gastroenterology;  Laterality: N/A;   SPYGLASS LITHOTRIPSY N/A  03/05/2021   Procedure: [id number] LITHOTRIPSY;  Surgeon: Lemar Lofty., MD;  Location: [facility];  Service: Gastroenterology;  Laterality: N/A;   SPYGLASS LITHOTRIPSY N/A 02/11/2022   Procedure: [id number] LITHOTRIPSY;  Surgeon: Lemar Lofty., MD;  Location: [facility];  Service: Gastroenterology;  Laterality: N/A;   STENT REMOVAL  02/11/2022   Procedure: STENT REMOVAL;  Surgeon: Meridee Score Netty Starring., MD;  Location: [facility];  Service: Gastroenterology;;   TONSILLECTOMY     TOTAL KNEE ARTHROPLASTY Right      Social History:   reports that she quit smoking about 35 years ago. Her smoking use included cigarettes. She started smoking about 70 years ago. She has a 35 pack-year smoking history. She has never used smokeless tobacco. She reports that she does not currently use alcohol. She reports  that she does not use drugs.   Family History:  Her family history includes Diabetes in her daughter, mother, and son; Heart disease in her father; Hypertension in her daughter, daughter, son, and son; Lung cancer in her sister and son. There is no history of Colon cancer, Esophageal cancer, Inflammatory bowel disease, Liver disease, Pancreatic cancer, Rectal cancer, or Stomach cancer.   Allergies Allergies  Allergen Reactions   Gramicidin Other (See Comments)    Gramicidin is an antibiotic peptide that is used to treat local infections caused by gram-positive bacteria. Unknown reaction   Sulfa Antibiotics Rash   Wound Dressing Adhesive Rash and Other (See Comments)    Some dressings break out the skin     Home Medications  Prior to Admission medications   Medication Sig Start Date End Date Taking? Authorizing Provider  bisacodyl (DULCOLAX) 10 MG suppository Place 10 mg rectally daily as needed for moderate constipation.   Yes [provider]  CREON 36000-114000 units CPEP capsule TAKE 1 CAPSULE THREE TIMES A DAY BEFORE MEALS Patient taking differently: Take 36,000 Units by mouth See admin instructions. Take 36,000 units by mouth before breakfast and supper/evening meal 05/05/23  Yes Lowne Florina Ou R, DO  docusate sodium (COLACE) 100 MG capsule Take 1 capsule (100 mg total) by mouth 2 (two) times daily. Patient taking differently: Take 100-200 mg by mouth See admin instructions. Take 200 mg by mouth in the morning and 100 mg in the evening 03/18/21  Yes Osvaldo Shipper, MD  esomeprazole (NEXIUM) 40 MG capsule Take 40 mg by mouth in the morning and at bedtime.   Yes [provider]  famciclovir (FAMVIR) 125 MG tablet TAKE 1 TABLET DAILY 03/31/23  Yes Zola Button, Yvonne R, DO  melatonin 5 MG TABS Take 5 mg by mouth at bedtime.   Yes [provider]  Menthol, Topical Analgesic, (BIOFREEZE COOL THE PAIN) 4 % GEL Apply 1 application  topically every 6 (six) hours as  needed (pain).   Yes [provider]  metroNIDAZOLE (FLAGYL) 500 MG tablet Take 500 mg by mouth 2 (two) times daily.   Yes [provider]  mirtazapine (REMERON) 15 MG tablet TAKE 1 TABLET AT BEDTIME 04/16/23  Yes Seabron Spates R, DO  morphine (MS CONTIN) 30 MG 12 hr tablet Take 30 mg by mouth in the morning and at bedtime.   Yes [provider]  Multiple Vitamin (MULTIVITAMIN WITH MINERALS) TABS tablet Take 1 tablet by mouth daily with supper.   Yes [provider]  predniSONE (DELTASONE) 10 MG tablet Take 10 mg by mouth daily.   Yes [provider]  senna-docusate (SENNA S) 8.6-50 MG tablet  Take 1 tablet by mouth See admin instructions. Take 1 tablet by mouth in the morning and evening   Yes [provider]  sucralfate (CARAFATE) 1 GM/10ML suspension Take 10 mLs by mouth 2 (two) times daily.   Yes [provider]  TYLENOL 500 MG tablet Take 1,000 mg by mouth every 6 (six) hours as needed for mild pain (pain score 1-3) or headache.   Yes [provider]  XARELTO 20 MG TABS tablet TAKE 1 TABLET DAILY WITH SUPPER, START ONLY AFTER THE STARTER PACK HAS BEEN COMPLETED Patient taking differently: Take 20 mg by mouth daily with supper. 10/23/23  Yes Seabron Spates R, DO  bumetanide (BUMEX) 1 MG tablet Take 0.5-1 tablets (0.5-1 mg total) by mouth daily as needed (swelling). 10/31/23   Rolly Salter, MD  feeding supplement (ENSURE ENLIVE / ENSURE PLUS) LIQD Take 237 mLs by mouth 2 (two) times daily between meals. Patient not taking: Reported on 03/08/2024 02/20/24   Alberteen Sam, MD  glucose blood (FREESTYLE LITE) test strip CHECK BLOOD SUGAR TWICE A DAY 11/20/22   Zola Button, Grayling Congress, DO  JARDIANCE 25 MG TABS tablet TAKE 1 TABLET DAILY BEFORE BREAKFAST 01/30/24   Zola Button, Grayling Congress, DO  latanoprost (XALATAN) 0.005 % ophthalmic solution Place 1 drop into both eyes at bedtime. Patient not taking: Reported on  03/08/2024 08/24/21   Zola Button, Grayling Congress, DO  LINZESS 145 MCG CAPS capsule TAKE 1 CAPSULE DAILY BEFORE BREAKFAST Patient not taking: Reported on 03/08/2024 01/08/24   Donato Schultz, DO  morphine (MS CONTIN) 15 MG 12 hr tablet Take 1 tablet (15 mg total) by mouth every 12 (twelve) hours. Patient not taking: Reported on 03/08/2024 02/20/24   Alberteen Sam, MD  omeprazole (PRILOSEC) 40 MG capsule TAKE 1 CAPSULE IN THE MORNING AND AT BEDTIME Patient not taking: Reported on 03/08/2024 01/20/24   Mansouraty, Netty Starring., MD     Critical care time: 77 m      Cyril Mourning MD. [facility]. [place] [facility] Pager : [phone]  If no response to pager , please call [phone] until 7 pm After 7:00 pm call Elink  [phone]   03/08/2024

## 2024-03-08 NOTE — ED Triage Notes (Signed)
 EMS reports from [facility], c/o nausea and vomiting yesterday. No vomiting today but c/o abdominal pain, distention noted. Pt has pain pump.   BP 150/70 HR 130 RR 40 Sp02 96 RA CBG 263

## 2024-03-08 NOTE — ED Notes (Signed)
 ED TO INPATIENT HANDOFF REPORT  Name/Age/Gender Sherry Holloway 89 y.o. female  Code Status    Code Status Orders  (From admission, onward)           Start     Ordered   03/08/24 1836  Do not attempt resuscitation (DNR)- Limited -Do Not Intubate (DNI)  Continuous       Question Answer Comment  If pulseless and not breathing No CPR or chest compressions.   In Pre-Arrest Conditions (Patient Is Breathing and Has A Pulse) Do not intubate. Provide all appropriate non-invasive medical interventions. Avoid ICU transfer unless indicated or required.   Consent: Discussion documented in EHR or advanced directives reviewed      03/08/24 1837           Code Status History     Date Active Date Inactive Code Status Order ID Comments User Context   03/08/2024 1731 03/08/2024 1837 Limited: Do not attempt resuscitation (DNR) -DNR-LIMITED -Do Not Intubate/DNI  [id number]  Loetta Rough, MD ED   02/04/2024 2100 02/20/2024 2122 Limited: Do not attempt resuscitation (DNR) -DNR-LIMITED -Do Not Intubate/DNI  [id number]  Rometta Emery, MD Inpatient   10/27/2023 1828 10/31/2023 2128 Limited: Do not attempt resuscitation (DNR) -DNR-LIMITED -Do Not Intubate/DNI  [id number]  Almon Hercules, MD ED   07/27/2023 1903 07/29/2023 2318 DNR [id number]  Jacques Navy, MD Inpatient   11/26/2021 1357 11/29/2021 1936 DNR [id number]  Jonah Blue, MD Inpatient   03/16/2021 2249 03/18/2021 1638 Full Code [id number]  Charlsie Quest, MD Inpatient      Advance Directive Documentation    Flowsheet Row Most Recent Value  Type of Advance Directive Healthcare Power of Attorney, Living will  Pre-existing out of facility DNR order (yellow form or pink MOST form) --  "MOST" Form in Place? --       Home/SNF/Other Skilled nursing facility  Chief Complaint SBO (small bowel obstruction) (HCC) [K56.609]  Level of Care/Admitting Diagnosis ED Disposition     ED Disposition  Admit   Condition  --   Comment   Hospital Area: [facility] [[id number]]  Level of Care: ICU [6]  May admit patient to [facility] or [facility] if equivalent level of care is available:: No  Covid Evaluation: Asymptomatic - no recent exposure (last 10 days) testing not required  Diagnosis: SBO (small bowel obstruction) [facility]) [[id number]]  Admitting Physician: Oretha Milch [[id number]]  Attending Physician: Oretha Milch [[id number]]  Certification:: I certify this patient will need inpatient services for at least 2 midnights  Expected Medical Readiness: 03/15/2024          Medical History Past Medical History:  Diagnosis Date   Arthritis    Choledocholithiasis    Diabetes mellitus without complication (HCC)    Diverticulitis    Esophageal cancer (HCC) dx'd 2020   GERD (gastroesophageal reflux disease)    Hyperlipidemia    Hypertension    OSA (obstructive sleep apnea)    Urine incontinence    Uterine cancer (HCC)     Allergies Allergies  Allergen Reactions   Gramicidin Other (See Comments)    Gramicidin is an antibiotic peptide that is used to treat local infections caused by gram-positive bacteria. Unknown reaction   Sulfa Antibiotics Rash   Wound Dressing Adhesive Rash and Other (See Comments)    Some dressings break out the skin    IV Location/Drains/Wounds Patient Lines/Drains/Airways Status     Active Line/Drains/Airways     Name  Placement date Placement time Site Days   Peripheral IV 03/08/24 20 G Left Antecubital 03/08/24  1351  Antecubital  less than 1   NG/OG Vented/Dual Lumen 14 Fr. Right nare Marking at nare/corner of mouth 60 cm 03/08/24  1709  Right nare  less than 1   GI Stent 10/29/23  1400  --  131   GI Stent 10/29/23  1405  --  131   Pressure Injury 10/29/23 Heel Anterior;Right Deep Tissue Pressure Injury - Purple or maroon localized area of discolored intact skin or blood-filled blister due to damage of underlying soft tissue from pressure and/or shear. Heel ulcer 10/29/23   0947  -- 131   Wound / Incision (Open or Dehisced) 10/29/23 Non-pressure wound Heel Anterior;Left Heel ulcer 10/29/23  0948  Heel  131            Labs/Imaging Results for orders placed or performed during the hospital encounter of 03/08/24 (from the past 48 hours)  Comprehensive metabolic panel     Status: Abnormal   Collection Time: 03/08/24  1:55 PM  Result Value Ref Range   Sodium 131 (L) 135 - 145 mmol/L    Comment: ELECTROLYTES REPEATED TO VERIFY   Potassium 3.9 3.5 - 5.1 mmol/L   Chloride 93 (L) 98 - 111 mmol/L    Comment: ELECTROLYTES REPEATED TO VERIFY   CO2 16 (L) 22 - 32 mmol/L    Comment: ELECTROLYTES REPEATED TO VERIFY   Glucose, Bld 228 (H) 70 - 99 mg/dL    Comment: Glucose reference range applies only to samples taken after fasting for at least 8 hours.   BUN 36 (H) 8 - 23 mg/dL   Creatinine, Ser 4.25 (H) 0.44 - 1.00 mg/dL   Calcium 9.3 8.9 - 95.6 mg/dL   Total Protein 5.5 (L) 6.5 - 8.1 g/dL   Albumin 2.0 (L) 3.5 - 5.0 g/dL   AST 387 (H) 15 - 41 U/L    Comment: RESULT CONFIRMED BY MANUAL DILUTION   ALT 115 (H) 0 - 44 U/L    Comment: RESULT CONFIRMED BY MANUAL DILUTION   Alkaline Phosphatase 123 38 - 126 U/L   Total Bilirubin 0.6 0.0 - 1.2 mg/dL   GFR, Estimated 49 (L) >60 mL/min    Comment: (NOTE) Calculated using the CKD-EPI Creatinine Equation (2021)    Anion gap 22 (H) 5 - 15    Comment: ELECTROLYTES REPEATED TO VERIFY Performed at [facility], [address] [place], [place] [address]   CBC with Differential     Status: Abnormal   Collection Time: 03/08/24  1:55 PM  Result Value Ref Range   WBC 40.4 (H) 4.0 - 10.5 K/uL   RBC 4.02 3.87 - 5.11 MIL/uL   Hemoglobin 10.5 (L) 12.0 - 15.0 g/dL   HCT 29.5 (L) 18.8 - 41.6 %   MCV 84.1 80.0 - 100.0 fL   MCH 26.1 26.0 - 34.0 pg   MCHC 31.1 30.0 - 36.0 g/dL   RDW 60.6 (H) 30.1 - 60.1 %   Platelets 385 150 - 400 K/uL   nRBC 0.0 0.0 - 0.2 %   Neutrophils Relative % 94 %   Neutro Abs  37.5 (H) 1.7 - 7.7 K/uL   Lymphocytes Relative 1 %   Lymphs Abs 0.5 (L) 0.7 - 4.0 K/uL   Monocytes Relative 3 %   Monocytes Absolute 1.4 (H) 0.1 - 1.0 K/uL   Eosinophils Relative 0 %   Eosinophils Absolute 0.0 0.0 -  0.5 K/uL   Basophils Relative 0 %   Basophils Absolute 0.0 0.0 - 0.1 K/uL   WBC Morphology VACUOLATED NEUTROPHILS    Immature Granulocytes 2 %   Abs Immature Granulocytes 0.92 (H) 0.00 - 0.07 K/uL   Burr Cells PRESENT     Comment: Performed at [facility], [address], [place], [place] [address]  Protime-INR     Status: Abnormal   Collection Time: 03/08/24  1:55 PM  Result Value Ref Range   Prothrombin Time >90.0 (H) 11.4 - 15.2 seconds   INR >10.0 (HH) 0.8 - 1.2    Comment: CRITICAL RESULT CALLED TO, READ BACK BY AND VERIFIED WITH: I.FOLEY,RN ON 03/08/2024 AT 1513 BY SL (NOTE) INR goal varies based on device and disease states. Performed at [facility], [address], [place], [place] [address] CORRECTED ON 03/24 AT 1515: PREVIOUSLY REPORTED AS >10.0 CRITICAL RESULT CALLED TO, READ BACK BY AND VERIFIED WITH: I.FOLEY,RN ON 03/08/2024 AT 1513 BY SL   Lipase, blood     Status: None   Collection Time: 03/08/24  1:55 PM  Result Value Ref Range   Lipase 26 11 - 51 U/L    Comment: Performed at [facility], [address] [place], [place] [address]  I-Stat Lactic Acid, ED     Status: Abnormal   Collection Time: 03/08/24  2:02 PM  Result Value Ref Range   Lactic Acid, Venous 13.2 (HH) 0.5 - 1.9 mmol/L   Comment NOTIFIED PHYSICIAN   I-stat chem 8, ED (not at [facility], [facility] or [facility])     Status: Abnormal   Collection Time: 03/08/24  2:02 PM  Result Value Ref Range   Sodium 127 (L) 135 - 145 mmol/L   Potassium 3.9 3.5 - 5.1 mmol/L   Chloride 94 (L) 98 - 111 mmol/L   BUN 32 (H) 8 - 23 mg/dL   Creatinine, Ser 9.56 0.44 - 1.00 mg/dL   Glucose, Bld 213 (H) 70 - 99 mg/dL    Comment: Glucose reference range applies  only to samples taken after fasting for at least 8 hours.   Calcium, Ion 1.15 1.15 - 1.40 mmol/L   TCO2 16 (L) 22 - 32 mmol/L   Hemoglobin 11.6 (L) 12.0 - 15.0 g/dL   HCT 08.6 (L) 57.8 - 46.9 %  I-Stat Lactic Acid, ED     Status: Abnormal   Collection Time: 03/08/24  4:07 PM  Result Value Ref Range   Lactic Acid, Venous 12.5 (HH) 0.5 - 1.9 mmol/L   Comment NOTIFIED PHYSICIAN   Protime-INR     Status: Abnormal   Collection Time: 03/08/24  4:09 PM  Result Value Ref Range   Prothrombin Time >90.0 (H) 11.4 - 15.2 seconds   INR >10.0 (HH) 0.8 - 1.2    Comment: CRITICAL RESULT CALLED TO, READ BACK BY AND VERIFIED WITH: B.SHELTON,RN ON 03/08/2024 AT 1734 BY SL (NOTE) INR goal varies based on device and disease states. Performed at [facility], [address] [place], [place] [address]   CBG monitoring, ED     Status: Abnormal   Collection Time: 03/08/24  7:35 PM  Result Value Ref Range   Glucose-Capillary 199 (H) 70 - 99 mg/dL    Comment: Glucose reference range applies only to samples taken after fasting for at least 8 hours.   DG Abdomen 1 View Result Date: 03/08/2024 CLINICAL DATA:  Nasogastric tube placement EXAM: ABDOMEN - 1 VIEW COMPARISON:  CT abdomen 02/09/2024 FINDINGS: Nasogastric tube side port in the stomach body, tip in the stomach fundus. The tube is satisfactorily positioned. Dorsal column stimulator noted. Subsegmental atelectasis at the left lung base. Dextroconvex lumbar scoliosis.  Polymer biliary stents noted. Contrast medium in the renal collecting systems. Dilated upper abdominal bowel. The stomach is less dilated than it was on the CT scan from 03/08/2024 2:41 p.m. IMPRESSION: 1. Nasogastric tube side port in the stomach body, tip in the stomach fundus. The tube is satisfactorily positioned. 2. Dilated upper abdominal bowel. The stomach is less dilated than it was on the CT scan from 03/08/2024 2:41 PM. 3. Subsegmental atelectasis at the left lung  base. Electronically Signed   By: Gaylyn Rong M.D.   On: 03/08/2024 18:02   DG Chest 2 View Result Date: 03/08/2024 CLINICAL DATA:  Nausea and vomiting, abdominal pain and distension EXAM: CHEST - 2 VIEW COMPARISON:  02/15/2024 and CT abdomen overlapping portions from 03/08/2024 FINDINGS: The patient is rotated to the right on today's radiograph, reducing diagnostic sensitivity and specificity. Atherosclerotic calcification of the aortic arch. Mild atelectasis or scarring at the left lung base. Heart size within normal limits. Dorsal column stimulator leads over the lower thoracic spine. Otherwise unremarkable. IMPRESSION: 1. Mild atelectasis or scarring at the left lung base. 2. Aortic Atherosclerosis (ICD10-I70.0). Electronically Signed   By: Gaylyn Rong M.D.   On: 03/08/2024 18:00   CT ABDOMEN PELVIS W CONTRAST Result Date: 03/08/2024 CLINICAL DATA:  Nausea and vomiting yesterday. Abdominal pain and distension. 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 * Tracking Code: BO * EXAM: CT ABDOMEN AND PELVIS WITH CONTRAST TECHNIQUE: Multidetector CT imaging of the abdomen and pelvis was performed using the standard protocol following bolus administration of intravenous contrast. RADIATION DOSE REDUCTION: This exam was performed according to the departmental dose-optimization program which includes automated exposure control, adjustment of the mA and/or kV according to patient size and/or use of iterative reconstruction technique. CONTRAST:  OMNIPAQUE IOHEXOL 300 MG/ML  SOLN COMPARISON:  02/14/2024 FINDINGS: Lower chest: Mildly dilated fluid-filled esophagus extending to the distended stomach. Coronary atherosclerosis. Atelectasis or scarring in both lower lobes. Hepatobiliary: Pneumobilia especially affecting the left hepatic lobe as on previous exam. Mild hypoenhancement in the left hepatic lobe on portal venous phase images. Dilated common bile duct with two  polymer biliary stents noted extending from the proximal CBD into the duodenum. Cholecystectomy. Indistinct/infiltrative hypodensity along the anterior margin of the right hepatic lobe on 33 of series 2, significance uncertain. Narrow left portal vein and tributaries without visualized occlusion or thrombosis. Pancreas: Pancreatic atrophy with speckled calcifications along the atrophic parenchyma compatible chronic calcific pancreatitis. Substantially dilated dorsal pancreatic duct especially in the pancreatic head where there continues to be a substantial cystic lesion difficult to differentiate from the adjacent duodenal lumen but measuring about 2.5 by 3.4 cm on image 36 series 2, quite possibly intraductal papillary mucinous neoplasm. Spleen: The spleen appears diffusely hypodense even on delayed images. The splenic vein appears patent at the junction with the SMV but is indistinct distally in the vicinity of the spleen, and possibly occluded or nearly occluded. Adrenals/Urinary Tract: Distended urinary bladder. No hydronephrosis or hydroureter. Adrenal glands unremarkable. Mild bilateral renal atrophy Stomach/Bowel: Diffusely distended stomach, small bowel, large bowel. Frothy fluid in the descending colon with some potential formed stool in the rectosigmoid. I do not see significant segments of nondilated bowel, accordingly the appearance favors a diffuse bowel ileus over obstruction. Sigmoid colon  diverticulosis is present. I do not see definite pneumatosis. Vascular/Lymphatic: Atherosclerosis is present, including aortoiliac atherosclerotic disease. Severe atheromatous plaque at the origin of the celiac trunk, occlusion not excluded. Severe atheromatous proximally in the superior mesenteric artery, compatible with at least severe stenosis if not occlusion with reconstitution. Today's exam was not performed as a CT angiogram. Dense atheromatous plaque proximally in both renal arteries. The inferior mesenteric  artery appears to opacify and accordingly is thought to likely be patent. Reproductive: Uterus absent. Other: No ascites. Mild widespread subcutaneous edema. Right abdominal pain pump and/or dorsal column stimulator noted. Musculoskeletal: Left total hip prosthesis. Lumbar spondylosis and degenerative disc disease with postoperative findings in the lower lumbar spine and grade 1 anterolisthesis at L3-4 and L4-5. There is bilateral foraminal impingement at L3-4. IMPRESSION: 1. Diffusely distended stomach, small bowel, and large bowel, with frothy fluid in the descending colon and some potential formed stool in the rectosigmoid. I do not see significant segments of nondilated bowel, accordingly the appearance favors a diffuse bowel ileus over obstruction. 2. Severe atheromatous plaque at the origin of the celiac trunk, occlusion not excluded. Severe atheromatous plaque proximally in the superior mesenteric artery, compatible with at least severe stenosis if not occlusion with reconstitution. Today's exam was not performed as a CT angiogram. Consider vascular referral for workup for chronic mesenteric ischemia, especially if the patient has had recent weight loss and symptoms such as postprandial abdominal pain. 3. The spleen appears diffusely hypodense even on delayed images. The splenic vein appears patent at the junction with the SMV but is indistinct distally in the vicinity of the spleen, and possibly occluded or nearly occluded. The splenic vein is probably occluded or nearly occluded. Cannot exclude incipient splenic infarction. 4. Indistinct/infiltrative hypodensity along the anterior margin of the right hepatic lobe, significance uncertain. This could be from steatosis or conceivably proper hepatic arterial vascular insufficiency. 5. Pneumobilia especially affecting the left hepatic lobe as on previous exam. Dilated common bile duct with two polymer biliary stents noted extending from the proximal CBD into the  duodenum. 6. Chronic calcific pancreatitis. 7. Substantially dilated dorsal pancreatic duct especially in the pancreatic head where there continues to be a substantial cystic lesion difficult to differentiate from the adjacent duodenal lumen but measuring about 2.5 by 3.4 cm, quite possibly intraductal papillary mucinous neoplasm. 8. Distended urinary bladder. 9. Mild bilateral renal atrophy. 10. Coronary atherosclerosis. 11. Lumbar spondylosis and degenerative disc disease with postoperative findings in the lower lumbar spine and grade 1 anterolisthesis at L3-4 and L4-5. Bilateral foraminal impingement at L3-4. Electronically Signed   By: Gaylyn Rong M.D.   On: 03/08/2024 16:13    Pending Labs Unresulted Labs (From admission, onward)     Start     Ordered   03/06/2024 0500  Type and screen If need to transfuse blood products please use the blood administration order set  Once,   R       Comments: If need to transfuse blood products please use the blood administration order set    03/08/24 1837   03/10/2024 0500  CBC  Tomorrow morning,   R        03/08/24 1837   03/11/2024 0500  Magnesium  Tomorrow morning,   R        03/08/24 1837   02/21/2024 0500  Phosphorus  Tomorrow morning,   R        03/08/24 1837   02/25/2024 0500  Comprehensive metabolic panel  Tomorrow morning,  R        03/08/24 1837   03/07/2024 0500  Protime-INR  Tomorrow morning,   R        03/08/24 1837   03/08/24 1305  Culture, blood (Routine x 2)  BLOOD CULTURE X 2,   R (with STAT occurrences)      03/08/24 1304   03/08/24 1305  Urinalysis, w/ Reflex to Culture (Infection Suspected) -Urine, Clean Catch  Once,   URGENT       Question:  Specimen Source  Answer:  Urine, Clean Catch   03/08/24 1304            Vitals/Pain Today's Vitals   03/08/24 1956 03/08/24 2000 03/08/24 2022 03/08/24 2030  BP: 96/68 (!) 115/52  (!) 97/50  Pulse: 84 85  90  Resp: (!) 26 (!) 31  (!) 26  Temp:   (!) 97 F (36.1 C)   TempSrc:    Axillary   SpO2: 95% 95%  96%  PainSc:        Isolation Precautions No active isolations  Medications Medications  lactated ringers infusion ( Intravenous New Bag/Given 03/08/24 1949)  insulin aspart (novoLOG) injection 0-9 Units (2 Units Subcutaneous Given 03/08/24 2038)  piperacillin-tazobactam (ZOSYN) IVPB 3.375 g (has no administration in time range)  lactated ringers bolus 1,000 mL (0 mLs Intravenous Stopped 03/08/24 1502)  HYDROmorphone (DILAUDID) injection 0.5 mg (0.5 mg Intravenous Given 03/08/24 1351)  ondansetron (ZOFRAN) injection 4 mg (4 mg Intravenous Given 03/08/24 1351)  ceFEPIme (MAXIPIME) 2 g in sodium chloride 0.9 % 100 mL IVPB (0 g Intravenous Stopped 03/08/24 1502)  metroNIDAZOLE (FLAGYL) IVPB 500 mg (0 mg Intravenous Stopped 03/08/24 1552)  lactated ringers bolus 1,000 mL (0 mLs Intravenous Stopped 03/08/24 1552)  iohexol (OMNIPAQUE) 300 MG/ML solution 100 mL (100 mLs Intravenous Contrast Given 03/08/24 1440)  HYDROmorphone (DILAUDID) injection 0.5 mg (0.5 mg Intravenous Given 03/08/24 1954)  metoCLOPramide (REGLAN) injection 5 mg (5 mg Intravenous Given 03/08/24 1954)    Mobility non-ambulatory

## 2024-03-08 NOTE — ED Provider Notes (Signed)
 I provided a substantive portion of the care of this patient.  I personally made/approved the management plan for this patient and take responsibility for the patient management.   I provided a substantive portion of the care of this patient.  I personally made/approved the management plan for this patient and take responsibility for the patient management.  EKG Interpretation Date/Time:  Monday March 08 2024 12:42:12 EDT Ventricular Rate:  116 PR Interval:  157 QRS Duration:  124 QT Interval:  348 QTC Calculation: 484 R Axis:   -73  Text Interpretation: Reconfirmed by Lorre Nick ([id number]) on 03/08/2024 2:12:44 PM   Patient had an EKG which shows sinus tach with PACs.  Patient appears to be floridly septic at this time from likely abdominal etiology.  Will start broad-spectrum antibiotics and waiting CT imaging   Lorre Nick, MD 03/08/24 1413

## 2024-03-08 NOTE — Telephone Encounter (Signed)
 Returned call. Amber advised that patient is stable and will reach out to [facility] next week if she feels it is necessary

## 2024-03-08 NOTE — ED Provider Notes (Signed)
 [place] EMERGENCY DEPARTMENT AT [facility] Provider Note   CSN: [id number] Arrival date & time: 03/08/24  1229     History  Chief Complaint  Patient presents with   Nausea   Emesis   Abdominal Pain    Sherry Holloway is a 89 y.o. female history of esophageal cancer, history of uterine cancer, hyperlipidemia, hypertension, diverticulitis, ileus, diabetes, status post appendectomy, status post cholecystectomy presenting to emergency room with complaint of diffuse abdominal pain that started approximately 1 month ago but has acutely worsened over the past 2 to 3 days.  Pain is associated with nausea with 1 episode of nonbilious nonbloody vomit yesterday as well as 2 episodes of loose stool yesterday. Has over all been each much less than normal, did tolerating eating small amount yesterday. Family member in room reports that her abdomen has looked very distended over the past day. She is on blood thinner, no blood stool. Has yellow stool when this all started, but seems to have improved. Reports recent antibiotic use while in hospital.  Denies any chest pain, shortness of breath, cough.   Emesis Associated symptoms: abdominal pain   Abdominal Pain Associated symptoms: vomiting        Home Medications Prior to Admission medications   Medication Sig Start Date End Date Taking? Authorizing Provider  bumetanide (BUMEX) 1 MG tablet Take 0.5-1 tablets (0.5-1 mg total) by mouth daily as needed (swelling). 10/31/23   Rolly Salter, MD  CREON [phone] units CPEP capsule TAKE 1 CAPSULE THREE TIMES A DAY BEFORE MEALS Patient taking differently: Take 36,000 Units by mouth See admin instructions. Take 36,000 units by mouth before breakfast and supper/evening meal 05/05/23   Zola Button, Grayling Congress, DO  docusate sodium (COLACE) 100 MG capsule Take 1 capsule (100 mg total) by mouth 2 (two) times daily. Patient taking differently: Take 100-200 mg by mouth See admin instructions.  Take 200 mg by mouth in the morning and 100 mg in the evening 03/18/21   Osvaldo Shipper, MD  famciclovir [facility]) 125 MG tablet TAKE 1 TABLET DAILY 03/31/23   Zola Button, Grayling Congress, DO  feeding supplement (ENSURE ENLIVE / ENSURE PLUS) LIQD Take 237 mLs by mouth 2 (two) times daily between meals. 02/20/24   Danford, Earl Lites, MD  glucose blood (FREESTYLE LITE) test strip CHECK BLOOD SUGAR TWICE A DAY 11/20/22   Zola Button, Grayling Congress, DO  JARDIANCE 25 MG TABS tablet TAKE 1 TABLET DAILY BEFORE BREAKFAST 01/30/24   Zola Button, Grayling Congress, DO  latanoprost (XALATAN) 0.005 % ophthalmic solution Place 1 drop into both eyes at bedtime. 08/24/21   Donato Schultz, DO  LINZESS 145 MCG CAPS capsule TAKE 1 CAPSULE DAILY BEFORE BREAKFAST Patient taking differently: Take 145 mcg by mouth daily before breakfast. 01/08/24   Zola Button, Grayling Congress, DO  mirtazapine (REMERON) 15 MG tablet TAKE 1 TABLET AT BEDTIME 04/16/23   Zola Button, Grayling Congress, DO  morphine (MS CONTIN) 15 MG 12 hr tablet Take 1 tablet (15 mg total) by mouth every 12 (twelve) hours. 02/20/24   Danford, Earl Lites, MD  Multiple Vitamin (MULTIVITAMIN WITH MINERALS) TABS tablet Take 1 tablet by mouth daily with supper.    [provider]  omeprazole (PRILOSEC) 40 MG capsule TAKE 1 CAPSULE IN THE MORNING AND AT BEDTIME Patient taking differently: Take 40 mg by mouth in the morning and at bedtime. 01/20/24   Mansouraty, Netty Starring., MD  senna-docusate (SENNA S) 8.6-50 MG tablet  Take 1 tablet by mouth See admin instructions. Take 1 tablet by mouth in the morning and evening    [provider]  sucralfate (CARAFATE) 1 g tablet Take 1 tablet (1 g total) by mouth 2 (two) times daily. 01/28/24   Mansouraty, Netty Starring., MD  TYLENOL 500 MG tablet Take 500-1,000 mg by mouth every 6 (six) hours as needed for mild pain (pain score 1-3) or headache.    [provider]  XARELTO 20 MG TABS tablet TAKE 1 TABLET DAILY WITH SUPPER, START ONLY AFTER  THE STARTER PACK HAS BEEN COMPLETED Patient taking differently: Take 20 mg by mouth daily with supper. 10/23/23   Donato Schultz, DO      Allergies    Gramicidin, Sulfa antibiotics, and Wound dressing adhesive    Review of Systems   Review of Systems  Gastrointestinal:  Positive for abdominal pain and vomiting.    Physical Exam Updated Vital Signs BP 117/73   Pulse (!) 106   Temp 97.6 F (36.4 C)   Resp (!) 29   SpO2 98%  Physical Exam Vitals and nursing note reviewed.  Constitutional:      General: She is not in acute distress.    Appearance: She is not toxic-appearing.  HENT:     Head: Normocephalic and atraumatic.  Eyes:     General: No scleral icterus.    Conjunctiva/sclera: Conjunctivae normal.  Cardiovascular:     Rate and Rhythm: Regular rhythm. Tachycardia present.     Pulses: Normal pulses.     Heart sounds: Normal heart sounds.  Pulmonary:     Effort: Pulmonary effort is normal. No respiratory distress.     Breath sounds: Normal breath sounds.  Abdominal:     General: Abdomen is flat. Bowel sounds are normal. There is distension.     Palpations: Abdomen is soft. There is no mass.     Tenderness: There is abdominal tenderness.     Comments: Spinal stim in RLQ  Musculoskeletal:     Right lower leg: No edema.     Left lower leg: No edema.  Skin:    General: Skin is warm and dry.     Coloration: Skin is not jaundiced.     Findings: No lesion.  Neurological:     General: No focal deficit present.     Mental Status: She is alert and oriented to person, place, and time. Mental status is at baseline.     ED Results / Procedures / Treatments   Labs (all labs ordered are listed, but only abnormal results are displayed) Labs Reviewed  COMPREHENSIVE METABOLIC PANEL - Abnormal; Notable for the following components:      Result Value   Sodium 131 (*)    Chloride 93 (*)    CO2 16 (*)    Glucose, Bld 228 (*)    BUN 36 (*)    Creatinine, Ser 1.05 (*)     Total Protein 5.5 (*)    Albumin 2.0 (*)    AST 275 (*)    ALT 115 (*)    GFR, Estimated 49 (*)    Anion gap 22 (*)    All other components within normal limits  CBC WITH DIFFERENTIAL/PLATELET - Abnormal; Notable for the following components:   WBC 40.4 (*)    Hemoglobin 10.5 (*)    HCT 33.8 (*)    RDW 15.6 (*)    Neutro Abs 37.5 (*)    Lymphs Abs 0.5 (*)  Monocytes Absolute 1.4 (*)    Abs Immature Granulocytes 0.92 (*)    All other components within normal limits  PROTIME-INR - Abnormal; Notable for the following components:   Prothrombin Time >90.0 (*)    INR >10.0 (*)    All other components within normal limits  I-STAT CG4 LACTIC ACID, ED - Abnormal; Notable for the following components:   Lactic Acid, Venous 13.2 (*)    All other components within normal limits  I-STAT CHEM 8, ED - Abnormal; Notable for the following components:   Sodium 127 (*)    Chloride 94 (*)    BUN 32 (*)    Glucose, Bld 216 (*)    TCO2 16 (*)    Hemoglobin 11.6 (*)    HCT 34.0 (*)    All other components within normal limits  CULTURE, BLOOD (ROUTINE X 2)  CULTURE, BLOOD (ROUTINE X 2)  LIPASE, BLOOD  URINALYSIS, W/ REFLEX TO CULTURE (INFECTION SUSPECTED)  PROTIME-INR  I-STAT CG4 LACTIC ACID, ED    EKG EKG Interpretation Date/Time:  Monday March 08 2024 12:42:12 EDT Ventricular Rate:  116 PR Interval:  157 QRS Duration:  124 QT Interval:  348 QTC Calculation: 484 R Axis:   -73  Text Interpretation: Reconfirmed by Lorre Nick ([id number]) on 03/08/2024 2:12:44 PM  Radiology DG Abdomen 1 View Result Date: 03/08/2024 CLINICAL DATA:  Nasogastric tube placement EXAM: ABDOMEN - 1 VIEW COMPARISON:  CT abdomen 02/09/2024 FINDINGS: Nasogastric tube side port in the stomach body, tip in the stomach fundus. The tube is satisfactorily positioned. Dorsal column stimulator noted. Subsegmental atelectasis at the left lung base. Dextroconvex lumbar scoliosis.  Polymer biliary stents noted. Contrast  medium in the renal collecting systems. Dilated upper abdominal bowel. The stomach is less dilated than it was on the CT scan from 03/08/2024 2:41 p.m. IMPRESSION: 1. Nasogastric tube side port in the stomach body, tip in the stomach fundus. The tube is satisfactorily positioned. 2. Dilated upper abdominal bowel. The stomach is less dilated than it was on the CT scan from 03/08/2024 2:41 PM. 3. Subsegmental atelectasis at the left lung base. Electronically Signed   By: Gaylyn Rong M.D.   On: 03/08/2024 18:02   DG Chest 2 View Result Date: 03/08/2024 CLINICAL DATA:  Nausea and vomiting, abdominal pain and distension EXAM: CHEST - 2 VIEW COMPARISON:  02/15/2024 and CT abdomen overlapping portions from 03/08/2024 FINDINGS: The patient is rotated to the right on today's radiograph, reducing diagnostic sensitivity and specificity. Atherosclerotic calcification of the aortic arch. Mild atelectasis or scarring at the left lung base. Heart size within normal limits. Dorsal column stimulator leads over the lower thoracic spine. Otherwise unremarkable. IMPRESSION: 1. Mild atelectasis or scarring at the left lung base. 2. Aortic Atherosclerosis (ICD10-I70.0). Electronically Signed   By: Gaylyn Rong M.D.   On: 03/08/2024 18:00   CT ABDOMEN PELVIS W CONTRAST Result Date: 03/08/2024 CLINICAL DATA:  Nausea and vomiting yesterday. Abdominal pain and distension. 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 * Tracking Code: BO * EXAM: CT ABDOMEN AND PELVIS WITH CONTRAST TECHNIQUE: Multidetector CT imaging of the abdomen and pelvis was performed using the standard protocol following bolus administration of intravenous contrast. RADIATION DOSE REDUCTION: This exam was performed according to the departmental dose-optimization program which includes automated exposure control, adjustment of the mA and/or kV according to patient size and/or use of iterative reconstruction technique.  CONTRAST:  OMNIPAQUE IOHEXOL 300 MG/ML  SOLN COMPARISON:  02/14/2024 FINDINGS: Lower  chest: Mildly dilated fluid-filled esophagus extending to the distended stomach. Coronary atherosclerosis. Atelectasis or scarring in both lower lobes. Hepatobiliary: Pneumobilia especially affecting the left hepatic lobe as on previous exam. Mild hypoenhancement in the left hepatic lobe on portal venous phase images. Dilated common bile duct with two polymer biliary stents noted extending from the proximal CBD into the duodenum. Cholecystectomy. Indistinct/infiltrative hypodensity along the anterior margin of the right hepatic lobe on 33 of series 2, significance uncertain. Narrow left portal vein and tributaries without visualized occlusion or thrombosis. Pancreas: Pancreatic atrophy with speckled calcifications along the atrophic parenchyma compatible chronic calcific pancreatitis. Substantially dilated dorsal pancreatic duct especially in the pancreatic head where there continues to be a substantial cystic lesion difficult to differentiate from the adjacent duodenal lumen but measuring about 2.5 by 3.4 cm on image 36 series 2, quite possibly intraductal papillary mucinous neoplasm. Spleen: The spleen appears diffusely hypodense even on delayed images. The splenic vein appears patent at the junction with the SMV but is indistinct distally in the vicinity of the spleen, and possibly occluded or nearly occluded. Adrenals/Urinary Tract: Distended urinary bladder. No hydronephrosis or hydroureter. Adrenal glands unremarkable. Mild bilateral renal atrophy Stomach/Bowel: Diffusely distended stomach, small bowel, large bowel. Frothy fluid in the descending colon with some potential formed stool in the rectosigmoid. I do not see significant segments of nondilated bowel, accordingly the appearance favors a diffuse bowel ileus over obstruction. Sigmoid colon diverticulosis is present. I do not see definite pneumatosis.  Vascular/Lymphatic: Atherosclerosis is present, including aortoiliac atherosclerotic disease. Severe atheromatous plaque at the origin of the celiac trunk, occlusion not excluded. Severe atheromatous proximally in the superior mesenteric artery, compatible with at least severe stenosis if not occlusion with reconstitution. Today's exam was not performed as a CT angiogram. Dense atheromatous plaque proximally in both renal arteries. The inferior mesenteric artery appears to opacify and accordingly is thought to likely be patent. Reproductive: Uterus absent. Other: No ascites. Mild widespread subcutaneous edema. Right abdominal pain pump and/or dorsal column stimulator noted. Musculoskeletal: Left total hip prosthesis. Lumbar spondylosis and degenerative disc disease with postoperative findings in the lower lumbar spine and grade 1 anterolisthesis at L3-4 and L4-5. There is bilateral foraminal impingement at L3-4. IMPRESSION: 1. Diffusely distended stomach, small bowel, and large bowel, with frothy fluid in the descending colon and some potential formed stool in the rectosigmoid. I do not see significant segments of nondilated bowel, accordingly the appearance favors a diffuse bowel ileus over obstruction. 2. Severe atheromatous plaque at the origin of the celiac trunk, occlusion not excluded. Severe atheromatous plaque proximally in the superior mesenteric artery, compatible with at least severe stenosis if not occlusion with reconstitution. Today's exam was not performed as a CT angiogram. Consider vascular referral for workup for chronic mesenteric ischemia, especially if the patient has had recent weight loss and symptoms such as postprandial abdominal pain. 3. The spleen appears diffusely hypodense even on delayed images. The splenic vein appears patent at the junction with the SMV but is indistinct distally in the vicinity of the spleen, and possibly occluded or nearly occluded. The splenic vein is probably  occluded or nearly occluded. Cannot exclude incipient splenic infarction. 4. Indistinct/infiltrative hypodensity along the anterior margin of the right hepatic lobe, significance uncertain. This could be from steatosis or conceivably proper hepatic arterial vascular insufficiency. 5. Pneumobilia especially affecting the left hepatic lobe as on previous exam. Dilated common bile duct with two polymer biliary stents noted extending from the proximal CBD into the  duodenum. 6. Chronic calcific pancreatitis. 7. Substantially dilated dorsal pancreatic duct especially in the pancreatic head where there continues to be a substantial cystic lesion difficult to differentiate from the adjacent duodenal lumen but measuring about 2.5 by 3.4 cm, quite possibly intraductal papillary mucinous neoplasm. 8. Distended urinary bladder. 9. Mild bilateral renal atrophy. 10. Coronary atherosclerosis. 11. Lumbar spondylosis and degenerative disc disease with postoperative findings in the lower lumbar spine and grade 1 anterolisthesis at L3-4 and L4-5. Bilateral foraminal impingement at L3-4. Electronically Signed   By: Gaylyn Rong M.D.   On: 03/08/2024 16:13    Procedures .Critical Care  Performed by: Smitty Knudsen, PA-C Authorized by: Smitty Knudsen, PA-C   Critical care provider statement:    Critical care time (minutes):  45   Critical care was necessary to treat or prevent imminent or life-threatening deterioration of the following conditions:  Circulatory failure, dehydration, hepatic failure, metabolic crisis, sepsis and shock   Critical care was time spent personally by me on the following activities:  Blood draw for specimens, development of treatment plan with patient or surrogate, discussions with consultants, evaluation of patient's response to treatment, examination of patient, vascular access procedures, review of old charts, pulse oximetry, re-evaluation of patient's condition, ordering and review of  laboratory studies, ordering and review of radiographic studies and ordering and performing treatments and interventions   I assumed direction of critical care for this patient from another provider in my specialty: yes     Care discussed with: admitting provider       Medications Ordered in ED Medications - No data to display  ED Course/ Medical Decision Making/ A&P Clinical Course as of 03/08/24 1916  Mon Mar 08, 2024  1701 Spoke with Sophronia Simas with gen surgery who agrees to see the patient -- recommending NG tube [JB]  1747 Icu agrees to see patient for admission [JB]    Clinical Course User Index [JB] Sofiah Lyne, Horald Chestnut, PA-C                                 Medical Decision Making Amount and/or Complexity of Data Reviewed Labs: ordered. Radiology: ordered.  Risk Prescription drug management. Decision regarding hospitalization.   Roanna Reaves [place]on[place]ore 89 y.o. presented today for abd pain. Working DDx includes, but not limited to, gastroenteritis, colitis, SBO, appendicitis, cholecystitis, hepatobiliary pathology, gastritis, PUD, ACS, dissection, pancreatitis, nephrolithiasis, AAA, UTI, pyelonephritis, torsion, pneumonia, dehydrations.   R/o DDx: These are considered less likely than current impression due to history of present illness, physical exam, labs/imaging findings.  Review of prior external notes: 02/20/24-- admit for ileus   Pmhx: esophageal cancer, history of uterine cancer, hyperlipidemia, hypertension, diverticulitis, ileus, diabetes, status post appendectomy, status post cholecystectomy  Unique Tests and My Interpretation:  CBC shows profound leukocytosis at 40, hemoglobin is 10.5, platelets 385 PT >90, INR >10 -- will repeat labs CMP with anion gap 22, creatinine 1.05, AST 275, ALT 115, T bili 0.6 Lipase 26 Lactic 13.2 Blood cultures pending.   EKG: Rate, rhythm, axis, intervals all examined: tachycardia of 155, no ST elevation   Imaging:  CT Abd/Pelvis  with contrast: evaluate for structural/surgical etiology of patients' severe abdominal pain.  Chest x-ray mild atelectasis in left lower lung base otherwise no acute abnormality.  Problem List / ED Course / Critical interventions / Medication management  Patient reporting with abdominal pain, distention and nausea vomiting.  Had similar  episode approximately 4 weeks ago and needed NG tube and admission for SBO.  She reports that she has had abdominal discomfort since her discharge however acutely worsened over the past day.  On arrival patient is quite tachycardic with increased respiratory rate given elevated white blood cell count will start normal saline 30 mL/kg fluid bolus as well as antibiotics for presumed abdominal infection.  Will also give pain medication and nausea medicine medication.  Ordering chest x-ray and CT abdomen pelvis.  CBC shows profound leukocytosis.  Hemoglobin is 10.5.  Platelets are normal.  CMP shows mild hyponatremia, elevation in AST, ALT, anion gap and AKI.  Patient's initial lactate 13, after fluid bolus 12.  PT/INR over 10, over 90 will recheck.  With concern for mesenteric ischemia will not correct at this time. CT scan shows diffusely distended stomach, small bowel and large bowel.  Will place NG tube and consult general surgery.  CT scan also shows severe plaque at the origin of celiac trunk as well as splenic vein with probable occlusion.  Discussed findings with vascular surgery.  Findings of chronic pancreatitis.  Has findings of pancreatic duct that are concerning for possible neoplasm.   Patient has had improved tachycardia with fluid bolus.  Blood pressures have been stable throughout stay.  I ordered medication including antibiotics for presumed abdominal sepsis, normal saline.  Reevaluation of the patient after these medicines showed that the patient improved Patients vitals assessed. Upon arrival patient is  hemodynamically stable but significantly tachycardic,  will order sepsis workup and IV fluids.  I have reviewed the patients home medicines and have made adjustments as needed   Consult: vascular who feel since patient is not a surgical candidate and xarelto that they likely would not received intervention if this was mesenteric ischemia.   Plan: Admit for severe sepsis, SBO        Final Clinical Impression(s) / ED Diagnoses Final diagnoses:  SBO (small bowel obstruction) (HCC)  Sepsis with acute organ dysfunction and septic shock, due to unspecified organism, unspecified organ dysfunction type [facility])    Rx / DC Orders ED Discharge Orders     None         Raford Pitcher Evalee Jefferson 03/08/24 2157    Lorre Nick, MD 03/10/24 1022

## 2024-03-08 NOTE — Progress Notes (Signed)
 Pharmacy Antibiotic Note  Sherry Holloway is a 89 y.o. female admitted on 03/08/2024 with abdominal pain and N/V. CT scan showed gastric distension as well as diffuse small bowel distension. Pharmacy has been consulted for Zosyn dosing.  Today, 03/08/24: WBC 40.4 LA 13.2 >> 12.5 Afebrile  HR 100s, BP soft-normal  Scr 1.00 and calculated Crcl ~34 ml/min using prior weight of 140 lbs (02/18/24)  Plan: Start Zosyn 3.375g IV q8hrs Monitor renal function, cultures, and overall clinical picture F/u surgery plans     Temp (24hrs), Avg:97.7 F (36.5 C), Min:97.6 F (36.4 C), Max:97.8 F (36.6 C)  Recent Labs  Lab 03/08/24 1355 03/08/24 1402 03/08/24 1607  WBC 40.4*  --   --   CREATININE 1.05* 1.00  --   LATICACIDVEN  --  13.2* 12.5*    CrCl cannot be calculated (Unknown ideal weight.).    Allergies  Allergen Reactions   Gramicidin Other (See Comments)    Gramicidin is an antibiotic peptide that is used to treat local infections caused by gram-positive bacteria. Unknown reaction   Sulfa Antibiotics Rash   Wound Dressing Adhesive Rash and Other (See Comments)    Some dressings break out the skin    Antimicrobials this admission: 3/24 cefepime x1 3/24 Flagyl x1 3/24 Zosyn >>  Dose adjustments this admission: N/A  Microbiology results: 3/24 BCx: in process   Thank you for allowing pharmacy to be a part of this patient's care.  Cherylin Mylar, PharmD Clinical Pharmacist  3/24/20257:14 PM

## 2024-03-08 NOTE — Progress Notes (Signed)
 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  Not a surgical candidate and transition to DNR.  Per GOC conversation, pressors PIV okay for first 24 hours -no CPR, no intubation, no CVL   Patient is tachypneic, tachycardic and borderline blood pressure on examination.  Treated with cefepime and metronidazole.  Mild hyperglycemia.  Results consistent with hyponatremia, anion gap metabolic acidosis, transaminitis, lactic acidosis, and leukocytosis.  Coagulopathic.  Nasogastric tube in the stomach, no pertinent chest findings  eICU Interventions  IV fluids and prokinetics given  Maintain Zosyn  On 90 morphine equivalents a day at home, planning on morphine as needed  DVT prophylaxis held in the setting of coagulopathy GI prophylaxis not indicated     Intervention Category Evaluation Type: New Patient Evaluation  Ranessa Kosta 03/08/2024, 10:17 PM

## 2024-03-08 NOTE — Sepsis Progress Note (Signed)
 Sepsis protocol is being followed by [facility].

## 2024-03-09 DIAGNOSIS — K56609 Unspecified intestinal obstruction, unspecified as to partial versus complete obstruction: Secondary | ICD-10-CM | POA: Diagnosis not present

## 2024-03-09 DIAGNOSIS — R100 Acute abdomen: Secondary | ICD-10-CM | POA: Diagnosis not present

## 2024-03-09 LAB — COMPREHENSIVE METABOLIC PANEL
ALT: 820 U/L — ABNORMAL HIGH (ref 0–44)
AST: 2361 U/L — ABNORMAL HIGH (ref 15–41)
Albumin: 1.7 g/dL — ABNORMAL LOW (ref 3.5–5.0)
Alkaline Phosphatase: 109 U/L (ref 38–126)
Anion gap: 16 — ABNORMAL HIGH (ref 5–15)
BUN: 40 mg/dL — ABNORMAL HIGH (ref 8–23)
CO2: 17 mmol/L — ABNORMAL LOW (ref 22–32)
Calcium: 9.1 mg/dL (ref 8.9–10.3)
Chloride: 96 mmol/L — ABNORMAL LOW (ref 98–111)
Creatinine, Ser: 1.06 mg/dL — ABNORMAL HIGH (ref 0.44–1.00)
GFR, Estimated: 49 mL/min — ABNORMAL LOW (ref >60)
Glucose, Bld: 153 mg/dL — ABNORMAL HIGH (ref 70–99)
Potassium: 3.5 mmol/L (ref 3.5–5.1)
Sodium: 129 mmol/L — ABNORMAL LOW (ref 135–145)
Total Bilirubin: 0.7 mg/dL (ref 0.0–1.2)
Total Protein: 4.3 g/dL — ABNORMAL LOW (ref 6.5–8.1)

## 2024-03-09 LAB — CBC
HCT: 25.6 % — ABNORMAL LOW (ref 36.0–46.0)
Hemoglobin: 8.1 g/dL — ABNORMAL LOW (ref 12.0–15.0)
MCH: 26.3 pg (ref 26.0–34.0)
MCHC: 31.6 g/dL (ref 30.0–36.0)
MCV: 83.1 fL (ref 80.0–100.0)
Platelets: 342 10*3/uL (ref 150–400)
RBC: 3.08 MIL/uL — ABNORMAL LOW (ref 3.87–5.11)
RDW: 15.8 % — ABNORMAL HIGH (ref 11.5–15.5)
WBC: 52.1 10*3/uL (ref 4.0–10.5)
nRBC: 0.1 % (ref 0.0–0.2)

## 2024-03-09 LAB — MAGNESIUM: Magnesium: 1.6 mg/dL — ABNORMAL LOW (ref 1.7–2.4)

## 2024-03-09 LAB — TYPE AND SCREEN
ABO/RH(D): A POS
Antibody Screen: NEGATIVE

## 2024-03-09 LAB — PHOSPHORUS: Phosphorus: 3.8 mg/dL (ref 2.5–4.6)

## 2024-03-09 LAB — GLUCOSE, CAPILLARY
Glucose-Capillary: 129 mg/dL — ABNORMAL HIGH (ref 70–99)
Glucose-Capillary: 126 mg/dL — ABNORMAL HIGH (ref 70–99)

## 2024-03-09 LAB — PROTIME-INR
INR: >10 (ref 0.8–1.2)
Prothrombin Time: >90 s — ABNORMAL HIGH (ref 11.4–15.2)

## 2024-03-09 LAB — ABO/RH: ABO/RH(D): A POS

## 2024-03-09 MED ORDER — DEXTROSE IN LACTATED RINGERS 5 % IV SOLN: INTRAVENOUS | Status: DC

## 2024-03-09 MED ORDER — MORPHINE BOLUS VIA INFUSION: 2.0000 mg | INTRAVENOUS | Status: DC | PRN

## 2024-03-09 MED ORDER — POLYVINYL ALCOHOL 1.4 % OP SOLN: 1.0000 [drp] | Freq: Four times a day (QID) | OPHTHALMIC | Status: DC | PRN

## 2024-03-09 MED ORDER — MORPHINE 100MG IN NS 100ML (1MG/ML) PREMIX INFUSION
0.0000 mg/h | INTRAVENOUS | Status: DC
  Administered 2024-03-09: 5 mg/h via INTRAVENOUS
  Filled 2024-03-09: qty 100

## 2024-03-09 MED ORDER — GLYCOPYRROLATE 0.2 MG/ML IJ SOLN: 0.2000 mg | INTRAMUSCULAR | Status: DC | PRN

## 2024-03-09 MED ORDER — ACETAMINOPHEN 325 MG PO TABS: 650.0000 mg | ORAL_TABLET | Freq: Four times a day (QID) | ORAL | Status: DC | PRN

## 2024-03-09 MED ORDER — GLYCOPYRROLATE 1 MG PO TABS: 1.0000 mg | ORAL_TABLET | ORAL | Status: DC | PRN

## 2024-03-09 MED ORDER — ACETAMINOPHEN 650 MG RE SUPP: 650.0000 mg | Freq: Four times a day (QID) | RECTAL | Status: DC | PRN

## 2024-03-09 MED ORDER — SODIUM CHLORIDE 0.9% FLUSH: 3.0000 mL | INTRAVENOUS | Status: DC | PRN

## 2024-03-09 MED ORDER — MAGNESIUM SULFATE 4 GM/100ML IV SOLN
4.0000 g | Freq: Once | INTRAVENOUS | Status: AC
  Administered 2024-03-09: 4 g via INTRAVENOUS
  Filled 2024-03-09: qty 100

## 2024-03-09 MED ORDER — SODIUM CHLORIDE 0.9% FLUSH
3.0000 mL | Freq: Two times a day (BID) | INTRAVENOUS | Status: DC
  Administered 2024-03-09: 10 mL via INTRAVENOUS

## 2024-03-09 MED ORDER — ORAL CARE MOUTH RINSE: 15.0000 mL | OROMUCOSAL | Status: DC | PRN

## 2024-03-11 ENCOUNTER — Ambulatory Visit: Payer: Self-pay

## 2024-03-11 NOTE — Telephone Encounter (Signed)
 Copied from CRM [id number]. Topic: General - Deceased Patient >> 04-06-24 10:16 AM Duncan Dull wrote: Name of caller: Ivy from [facility]  Date of death: 04/04/24   Name of funeral home: [employer] [facility]  Phone number of funeral home: [phone], [phone]  Provider that needs to sign form: Dr. Vassie Loll  Timeline for signing:

## 2024-03-13 LAB — CULTURE, BLOOD (ROUTINE X 2)
Culture: NO GROWTH
Culture: NO GROWTH

## 2024-03-16 NOTE — Interval H&P Note (Signed)
 History and Physical Interval Note:  03/08/24 6 pm  Sherry Holloway  has presented today for surgery, with the diagnosis of * No surgery found *.  The various methods of treatment have been discussed with the patient and family. After consideration of risks, benefits and other options for treatment, the patient has consented to  * No surgery found * as a surgical intervention.  The patient's history has been reviewed, patient examined, no change in status, stable for surgery.  I have reviewed the patient's chart and labs.  Questions were answered to the patient's satisfaction.     Comer Locket Vassie Loll

## 2024-03-16 NOTE — Progress Notes (Signed)
 NAME:  Sherry Holloway, MRN:  [id number], DOB:  September 08, 1928, LOS: 1 ADMISSION DATE:  03/08/2024, CONSULTATION DATE:  03/10/2024  REFERRING MD:  EDP, CHIEF COMPLAINT:  abdominal pain/distension   History of Present Illness:   89 year old woman, resident of [facility] presents with nausea and vomiting for 1 day and abdominal distention and severe abdominal pain.  History is obtained after speaking to her daughter who is a Engineer, civil (consulting) at [facility] and provides a detailed history. She was recently admitted for several weeks for small bowel obstruction and required several NG tube placement and decompression.  She was managed conservatively however even after return to the rehab facility, she was not able to take much orally and has had a weight loss of 15 to 20 pounds. Initial labs significant for INR more than 10, severe leukocytosis 40k , with left shift , lactate of 13. She was given IV fluids with decreasing her heart rate from 150s to low 100s. CT abdomen/pelvis shows small bowel obstruction without a definite transition point, no evidence of that bowel or portal venous gas She was seen by surgery in consultation, PCCM asked to admit  Pertinent  Medical History  Hypertension Esophageal cancer Biliary stones status post stent placement 10/2023 no bleeding okay Chronic pain syndrome, on opiates Chronic calcified pancreatitis  Significant Hospital Events: Including procedures, antibiotic start and stop dates in addition to other pertinent events     Interim History / Subjective:   Daughter at bedside Complaints of abdominal pain  Objective   Blood pressure (!) 96/35, pulse 87, temperature 98 F (36.7 C), temperature source Axillary, resp. rate (!) 26, height 5\' 4"  (1.626 m), weight 61.6 kg, SpO2 97%.        Intake/Output Summary (Last 24 hours) at 02/21/2024 1478 Last data filed at 02/17/2024 0600 Gross per 24 hour  Intake 2097.81 ml  Output 800 ml  Net 1297.81 ml   Filed  Weights   03/13/2024 0100  Weight: 61.6 kg    Examination: General: Elderly woman appears frail and chronically ill HENT: NG tube, mild pallor, no icterus, dry mucosa flat neck veins Lungs: Clear breath sounds bilateral, no accessory muscle use Cardiovascular: S1 and S2 tacky, sinus on monitor Abdomen: Distended, soft, diffuse tenderness, no guarding Extremities: No edema, no deformity Neuro: Somnolent but awakes easily, interactive, nonfocal   Labs show hyponatremia, BUN/creatinine 40/1.0, lactate 13, severe leukocytosis 52K with left shift, voice anemia, INR more than 10  Resolved Hospital Problem list     Assessment & Plan:  Acute abdomen Recurrent small bowel obstruction Severe lactic acidosis and leukocytosis increases concern for dead bowel  -NG tube decompression -Patient and daughter decided against surgery -Empiric Zosyn -continue IV fluids  Coagulopathy -related to Xarelto, and likely shock liver -Vitamin K  Will hold off on FFP, since surgery not planned  Pain management -chronic opiate use Add morphine drip  Goals of care : Daughter would like to avoid further pain and suffering for mom.  She had a prolonged course last time with SBO.  She is on chronic opiates.  We will initiate morphine drip with goal of comfort care and transition to full DNR   Best Practice (right click and "Reselect all SmartList Selections" daily)   Diet/type: NPO DVT prophylaxis DOAC Pressure ulcer(s): N/A GI prophylaxis: N/A Lines: N/A Foley:  N/A Code Status:  DNR Last date of multidisciplinary goals of care discussion DNR  Labs   CBC: Recent Labs  Lab 03/08/24 1355  03/08/24 1402 03/06/2024 0257  WBC 40.4*  --  52.1*  NEUTROABS 37.5*  --   --   HGB 10.5* 11.6* 8.1*  HCT 33.8* 34.0* 25.6*  MCV 84.1  --  83.1  PLT 385  --  342    Basic Metabolic Panel: Recent Labs  Lab 03/08/24 1355 03/08/24 1402 03/12/2024 0257  NA 131* 127* 129*  K 3.9 3.9 3.5  CL 93* 94* 96*   CO2 16*  --  17*  GLUCOSE 228* 216* 153*  BUN 36* 32* 40*  CREATININE 1.05* 1.00 1.06*  CALCIUM 9.3  --  9.1  MG  --   --  1.6*  PHOS  --   --  3.8   GFR: Estimated Creatinine Clearance: 28 mL/min (A) (by C-G formula based on SCr of 1.06 mg/dL (H)). Recent Labs  Lab 03/08/24 1355 03/08/24 1402 03/08/24 1607 02/21/2024 0257  WBC 40.4*  --   --  52.1*  LATICACIDVEN  --  13.2* 12.5*  --     Liver Function Tests: Recent Labs  Lab 03/08/24 1355 02/24/2024 0257  AST 275* 2,361*  ALT 115* 820*  ALKPHOS 123 109  BILITOT 0.6 0.7  PROT 5.5* 4.3*  ALBUMIN 2.0* 1.7*   Recent Labs  Lab 03/08/24 1355  LIPASE 26   No results for input(s): "AMMONIA" in the last 168 hours.  ABG    Component Value Date/Time   TCO2 16 (L) 03/08/2024 1402     Coagulation Profile: Recent Labs  Lab 03/08/24 1355 03/08/24 1609 03/07/2024 0257  INR >10.0* >10.0* >10.0*    Cardiac Enzymes: No results for input(s): "CKTOTAL", "CKMB", "CKMBINDEX", "TROPONINI" in the last 168 hours.  HbA1C: Hgb A1c MFr Bld  Date/Time Value Ref Range Status  02/14/2024 05:22 AM 7.7 (H) 4.8 - 5.6 % Final    Comment:    (NOTE) Pre diabetes:          5.7%-6.4%  Diabetes:              >6.4%  Glycemic control for   <7.0% adults with diabetes   11/07/2023 02:22 PM 7.3 (H) <5.7 % of total Hgb Final    Comment:    For someone without known diabetes, a hemoglobin A1c value of 6.5% or greater indicates that they may have  diabetes and this should be confirmed with a follow-up  test. . For someone with known diabetes, a value <7% indicates  that their diabetes is well controlled and a value  greater than or equal to 7% indicates suboptimal  control. A1c targets should be individualized based on  duration of diabetes, age, comorbid conditions, and  other considerations. . Currently, no consensus exists regarding use of hemoglobin A1c for diagnosis of diabetes for children. .     CBG: Recent Labs  Lab  03/08/24 1935 03/08/24 2311 03/07/2024 0330 03/01/2024 0719  GLUCAP 199* 119* 129* 126*      Cyril Mourning MD. FCCP. [facility] Pager : [phone]  If no response to pager , please call [phone] until 7 pm After 7:00 pm call Elink  [phone]   03/12/2024

## 2024-03-16 NOTE — Discharge Summary (Signed)
   DEATH SUMMARY   Patient Details  Name: LENICE KOPER MRN: [id number] DOB: Dec 18, 1927 XLK:GMWNU [name], Grayling Congress, DO  Admission/Discharge Information   Admit Date:  March 22, 2024  Date of Death: Date of Death: 03/23/2024  Time of Death: Time of Death: Mar 28, 1022  Length of Stay: 1   Principle Cause of death: Small bowel obstruction  Hospital Diagnoses: Principal Problem:   SBO (small bowel obstruction) (HCC)  History of Present Illness:    89 year old woman, resident of [facility] presents with nausea and vomiting for 1 day and abdominal distention and severe abdominal pain.  History is obtained after speaking to her daughter who is a Engineer, civil (consulting) at [facility] and provides a detailed history. She was recently admitted for several weeks for small bowel obstruction and required several NG tube placement and decompression.  She was managed conservatively however even after return to the rehab facility, she was not able to take much orally and has had a weight loss of 15 to 20 pounds. Initial labs significant for INR more than 10, severe leukocytosis 40k , with left shift , lactate of 13. She was given IV fluids with decreasing her heart rate from 150s to low 100s. CT abdomen/pelvis shows small bowel obstruction without a definite transition point, no evidence of that bowel or portal venous gas She was seen by surgery in consultation, PCCM asked to admit   Pertinent  Medical History  Hypertension Esophageal cancer Biliary stones status post stent placement 10/2023 no bleeding okay Chronic pain syndrome, on opiates Chronic calcified pancreatitis    Hospital Course:  Assessment and Plan:  Acute abdomen Recurrent small bowel obstruction Severe lactic acidosis and leukocytosis increases concern for dead bowel   -NG tube decompression -Patient and daughter decided against surgery -Empiric Zosyn -continue IV fluids   Coagulopathy -related to Xarelto, and likely shock  liver -Vitamin K  Will hold off on FFP, since surgery not planned   Pain management -chronic opiate use Add morphine drip   Goals of care : Daughter would like to avoid further pain and suffering for mom.  She had a prolonged course last time with SBO.  She is on chronic opiates.  We  initiated morphine drip with goal of comfort care and she passed away soon after      Consultations: Surgery   Signed: Comer Locket. Vassie Loll, MD 03/23/2024

## 2024-03-16 NOTE — Progress Notes (Signed)
 Chart reviewed Pt / daughter have expressed no interest in any surgical intervention  Agree with NGT and defined GOC.  Available if anything changes

## 2024-03-16 NOTE — Plan of Care (Signed)
  Problem: Education: Goal: Ability to describe self-care measures that may prevent or decrease complications (Diabetes Survival Skills Education) will improve Outcome: Progressing   Problem: Coping: Goal: Ability to adjust to condition or change in health will improve Outcome: Progressing   Problem: Fluid Volume: Goal: Ability to maintain a balanced intake and output will improve Outcome: Progressing   Problem: Health Behavior/Discharge Planning: Goal: Ability to identify and utilize available resources and services will improve Outcome: Progressing Goal: Ability to manage health-related needs will improve Outcome: Progressing   Problem: Metabolic: Goal: Ability to maintain appropriate glucose levels will improve Outcome: Progressing   Problem: Nutritional: Goal: Maintenance of adequate nutrition will improve Outcome: Progressing Goal: Progress toward achieving an optimal weight will improve Outcome: Progressing   Problem: Skin Integrity: Goal: Risk for impaired skin integrity will decrease Outcome: Progressing   Problem: Tissue Perfusion: Goal: Adequacy of tissue perfusion will improve Outcome: Progressing   Problem: Education: Goal: Knowledge of General Education information will improve Description: Including pain rating scale, medication(s)/side effects and non-pharmacologic comfort measures Outcome: Progressing   Problem: Health Behavior/Discharge Planning: Goal: Ability to manage health-related needs will improve Outcome: Progressing   Problem: Clinical Measurements: Goal: Ability to maintain clinical measurements within normal limits will improve Outcome: Progressing Goal: Will remain free from infection Outcome: Progressing Goal: Diagnostic test results will improve Outcome: Progressing Goal: Respiratory complications will improve Outcome: Progressing Goal: Cardiovascular complication will be avoided Outcome: Progressing   Problem: Activity: Goal:  Risk for activity intolerance will decrease Outcome: Progressing   Problem: Nutrition: Goal: Adequate nutrition will be maintained Outcome: Progressing   Problem: Coping: Goal: Level of anxiety will decrease Outcome: Progressing   Problem: Elimination: Goal: Will not experience complications related to bowel motility Outcome: Progressing Goal: Will not experience complications related to urinary retention Outcome: Progressing   Problem: Pain Managment: Goal: General experience of comfort will improve and/or be controlled Outcome: Progressing   Problem: Safety: Goal: Ability to remain free from injury will improve Outcome: Progressing   Problem: Skin Integrity: Goal: Risk for impaired skin integrity will decrease Outcome: Progressing   Cindy S. Clelia Croft BSN, RN, [employer], CCRN 03/06/2024 3:26 AM

## 2024-03-16 NOTE — Progress Notes (Signed)
 I responded to a page that Sherry Holloway is now comfort measures only.  I provided support for Sherry Holloway's daughter, Sherry Holloway, as she lovingly sat beside her mother.  Sherry Holloway sister and brother have already passed and Sherry Holloway's siblings have all passed as well. I provided prayer at Sherry Holloway request.  I passed care of this family on to chaplain Sherry Holloway.  [address], Bcc Pager, [phone]

## 2024-03-16 NOTE — Progress Notes (Signed)
 Chaplain support requested by chaplain team to provide follow up care to pt and her daughter, Alvino Chapel. Alvino Chapel shared meaningful memories of time spent with pt. Chaplain provided reflective listening, a compassionate presence and grief support. Chaplain team will continue follow up with family and will be available to provide spiritual support as needed. If urgent support is needed, please page chaplain team.  Fransico [facility]ow Div   02/14/2024 0900  Spiritual Encounters  Type of Visit Follow up (Simultaneous filing. User may not have seen previous data.)  Care provided to: Family (Simultaneous filing. User may not have seen previous data.)  Referral source Family (Simultaneous filing. User may not have seen previous data.)  Reason for visit End-of-life (Simultaneous filing. User may not have seen previous data.)  OnCall Visit No (Simultaneous filing. User may not have seen previous data.)  Spiritual Framework  Presenting Themes Impactful experiences and emotions  Community/Connection Family  Family Stress Factors Loss  Interventions  Spiritual Care Interventions Made Established relationship of care and support;Compassionate presence;Reflective listening  Intervention Outcomes  Outcomes Connection to spiritual care  Spiritual Care Plan  Spiritual Care Issues Still Outstanding Chaplain will continue to follow

## 2024-03-16 NOTE — TOC Initial Note (Signed)
 Transition of Care [facility]) - Initial/Assessment Note    Patient Details  Name: Sherry Holloway MRN: [id number] Date of Birth: 19-Nov-1928  Transition of Care [facility]) CM/SW Contact:    Diona Browner, LCSW Phone Number: 02/15/2024, 11:02 AM  Clinical Narrative:                 Pt from [facility]. Pt continues medical workup. Possibly to d/c to dtr home instead of back to SNF. Palliative consult pending. TOC following for d/c needs.     Barriers to Discharge: Continued Medical Work up   Patient Goals and CMS Choice Patient states their goals for this hospitalization and ongoing recovery are:: return home CMS Medicare.gov Compare Post Acute Care list provided to::  (NA) Choice offered to / list presented to : NA  ownership interest in [facility].provided to::  (NA)    Expected Discharge Plan and Services In-house Referral: NA     Living arrangements for the past 2 months: Skilled Nursing Facility                 DME Arranged: N/A DME Agency: NA       HH Arranged: NA HH Agency: NA        Prior Living Arrangements/Services Living arrangements for the past 2 months: Skilled Nursing Facility Lives with:: Facility Resident Patient language and need for interpreter reviewed:: Yes Do you feel safe going back to the place where you live?: Yes      Need for Family Participation in Patient Care: Yes (Comment) Care giver support system in place?: Yes (comment)   Criminal Activity/Legal Involvement Pertinent to Current Situation/Hospitalization: No - Comment as needed  Activities of Daily Living   ADL Screening (condition at time of admission) Independently performs ADLs?: No Does the patient have a NEW difficulty with bathing/dressing/toileting/self-feeding that is expected to last >3 days?: No Does the patient have a NEW difficulty with getting in/out of bed, walking, or climbing stairs that is expected to last >3 days?: No Does the patient have a NEW  difficulty with communication that is expected to last >3 days?: No Is the patient deaf or have difficulty hearing?: Yes Does the patient have difficulty seeing, even when wearing glasses/contacts?: Yes Does the patient have difficulty concentrating, remembering, or making decisions?: Yes  Permission Sought/Granted                  Emotional Assessment Appearance:: Appears stated age Attitude/Demeanor/Rapport: Engaged Affect (typically observed): Accepting Orientation: : Oriented to Place, Oriented to Self, Oriented to  Time, Oriented to Situation Alcohol / Substance Use: Not Applicable Psych Involvement: No (comment)  Admission diagnosis:  SBO (small bowel obstruction) (HCC) [K56.609] Sepsis with acute organ dysfunction and septic shock, due to unspecified organism, unspecified organ dysfunction type (HCC) [A41.9, R65.21] Patient Active Problem List   Diagnosis Date Noted   SBO (small bowel obstruction) (HCC) 03/08/2024   Leukocytosis 02/17/2024   Malnutrition of moderate degree 02/16/2024   Gastrointestinal dysmotility 02/15/2024   Constipation, chronic 02/15/2024   Abdominal pain, chronic, right lower quadrant 02/12/2024   Nausea and vomiting 02/04/2024   Ileus (HCC) 02/04/2024   Multiple gastric ulcers 12/24/2023   History of biliary stent insertion 12/24/2023   Chronic anticoagulation 12/24/2023   Biliary obstruction 10/30/2023   LFT elevation 10/29/2023   IPMN (intraductal papillary mucinous neoplasm) 10/29/2023   Abdominal pain, chronic, right upper quadrant 10/29/2023   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) 10/29/2023  Abdominal pain 10/27/2023   History of pulmonary embolism 10/27/2023   Elevated liver enzymes 10/27/2023   Symptomatic sinus bradycardia 07/27/2023   Bradycardia 05/02/2023   Dizziness 05/02/2023   At high risk for injury related to fall 02/06/2023   Balance disorder 01/30/2023   Vulvar rash 04/02/2022   Hyperlipidemia  associated with type 2 diabetes mellitus (HCC) 04/02/2022   Rash and nonspecific skin eruption 02/19/2022   Chronic pain disorder 11/26/2021   Choledocholithiasis 11/25/2021   Personal history of esophageal cancer 08/26/2021   Weight loss, unintentional 08/26/2021   Type 2 diabetes mellitus with diabetic polyneuropathy, without long-term current use of insulin (HCC) 06/12/2021   Snoring 04/30/2021   At risk for central sleep apnea 04/30/2021   Acute pulmonary embolism (HCC) 03/29/2021   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) 03/19/2021   History of venous thromboembolism    Subclavian arterial stenosis (HCC) 02/13/2021   Acquired trigger finger of left middle finger 01/09/2021   Acquired trigger finger of left ring finger 01/09/2021   Trigger finger of left hand 12/24/2020   Plantar wart 12/24/2020   Pancreatic mass 10/19/2020   Gastroesophageal reflux disease with esophagitis without hemorrhage 10/19/2020   Hypertension 10/19/2020   Uncontrolled type 2 diabetes mellitus with hyperglycemia, without long-term current use of insulin (HCC) 10/19/2020   Vaginal odor 10/19/2020   Hyperlipidemia 10/19/2020   OSA (obstructive sleep apnea) 10/19/2020   Chronic bilateral back pain 10/19/2020   PCP:  Donato Schultz, DO Pharmacy:   [id number] [place], [place] - [address] AT [facility] OF [address] [place] [address] Phone: [phone] Fax: [phone]  [facility] - [place], [place] - [address] [place] [address] Phone: [phone] Fax: [phone]     Social Drivers of Health (SDOH) Social History: SDOH Screenings   Food Insecurity: No Food Insecurity (02/25/2024)  Housing: Low Risk  (02/22/2024)  Transportation Needs: No Transportation Needs (03/14/2024)  Utilities: Not At Risk (03/06/2024)  Alcohol Screen: Low Risk  (04/02/2022)  Depression (PHQ2-9): Low Risk   (01/30/2023)  Financial Resource Strain: Low Risk  (04/02/2022)  Physical Activity: Sufficiently Active (04/02/2022)  Social Connections: Moderately Isolated (02/22/2024)  Stress: No Stress Concern Present (04/02/2022)  Tobacco Use: Medium Risk (03/08/2024)   SDOH Interventions:     Readmission Risk Interventions    02/24/2024   11:00 AM 02/05/2024   12:19 PM 10/31/2023    2:26 PM  Readmission Risk Prevention Plan  Transportation Screening Complete Complete Complete  PCP or Specialist Appt within 3-5 Days  Complete Complete  HRI or Home Care Consult  Complete Complete  Social Work Consult for Recovery Care Planning/Counseling  Complete Complete  Palliative Care Screening  Not Applicable Not Applicable  Medication Review Oceanographer) Complete Complete Complete  PCP or Specialist appointment within 3-5 days of discharge Complete    HRI or Home Care Consult Complete    SW Recovery Care/Counseling Consult Complete    Palliative Care Screening Complete    Skilled Nursing Facility Complete

## 2024-03-16 DEATH — deceased
